# Patient Record
Sex: Male | Born: 1975 | Hispanic: Yes | Marital: Married | Smoking: Never smoker
Health system: Southern US, Community
[De-identification: ages and names within clinical notes are randomized; demographics above are authoritative.]

## PROBLEM LIST (undated history)

## (undated) DIAGNOSIS — N186 End stage renal disease: Secondary | ICD-10-CM

## (undated) DIAGNOSIS — N200 Calculus of kidney: Secondary | ICD-10-CM

## (undated) DIAGNOSIS — M899 Disorder of bone, unspecified: Secondary | ICD-10-CM

## (undated) DIAGNOSIS — C9000 Multiple myeloma not having achieved remission: Secondary | ICD-10-CM

## (undated) DIAGNOSIS — R519 Headache, unspecified: Secondary | ICD-10-CM

## (undated) DIAGNOSIS — G709 Myoneural disorder, unspecified: Secondary | ICD-10-CM

## (undated) DIAGNOSIS — I10 Essential (primary) hypertension: Secondary | ICD-10-CM

## (undated) DIAGNOSIS — Z992 Dependence on renal dialysis: Secondary | ICD-10-CM

## (undated) DIAGNOSIS — M898X9 Other specified disorders of bone, unspecified site: Secondary | ICD-10-CM

## (undated) DIAGNOSIS — R51 Headache: Secondary | ICD-10-CM

## (undated) DIAGNOSIS — K219 Gastro-esophageal reflux disease without esophagitis: Secondary | ICD-10-CM

## (undated) DIAGNOSIS — Z9289 Personal history of other medical treatment: Secondary | ICD-10-CM

## (undated) HISTORY — DX: Multiple myeloma not having achieved remission: C90.00

## (undated) HISTORY — DX: Other specified disorders of bone, unspecified site: M89.8X9

## (undated) HISTORY — PX: INSERTION OF DIALYSIS CATHETER: SHX1324

## (undated) HISTORY — DX: Disorder of bone, unspecified: M89.9

---

## 2010-10-11 ENCOUNTER — Emergency Department (HOSPITAL_COMMUNITY)
Admission: EM | Admit: 2010-10-11 | Discharge: 2010-10-11 | Disposition: A | Discharge status: Home / Self Care | Payer: Worker's Compensation | Attending: Emergency Medicine | Admitting: Emergency Medicine

## 2010-10-11 ENCOUNTER — Emergency Department (HOSPITAL_COMMUNITY): Payer: Worker's Compensation

## 2010-10-11 DIAGNOSIS — W11XXXA Fall on and from ladder, initial encounter: Secondary | ICD-10-CM | POA: Insufficient documentation

## 2010-10-11 DIAGNOSIS — S0120XA Unspecified open wound of nose, initial encounter: Secondary | ICD-10-CM | POA: Insufficient documentation

## 2010-10-11 DIAGNOSIS — S060X1A Concussion with loss of consciousness of 30 minutes or less, initial encounter: Secondary | ICD-10-CM | POA: Insufficient documentation

## 2010-10-11 DIAGNOSIS — S8990XA Unspecified injury of unspecified lower leg, initial encounter: Secondary | ICD-10-CM | POA: Insufficient documentation

## 2010-10-11 DIAGNOSIS — S0180XA Unspecified open wound of other part of head, initial encounter: Secondary | ICD-10-CM | POA: Insufficient documentation

## 2010-10-11 DIAGNOSIS — S7010XA Contusion of unspecified thigh, initial encounter: Secondary | ICD-10-CM | POA: Insufficient documentation

## 2010-10-11 DIAGNOSIS — Y990 Civilian activity done for income or pay: Secondary | ICD-10-CM | POA: Insufficient documentation

## 2010-10-11 DIAGNOSIS — R51 Headache: Secondary | ICD-10-CM | POA: Insufficient documentation

## 2010-10-11 DIAGNOSIS — S0003XA Contusion of scalp, initial encounter: Secondary | ICD-10-CM | POA: Insufficient documentation

## 2010-10-11 DIAGNOSIS — S0083XA Contusion of other part of head, initial encounter: Secondary | ICD-10-CM | POA: Insufficient documentation

## 2010-10-15 ENCOUNTER — Inpatient Hospital Stay (HOSPITAL_COMMUNITY)
Admission: RE | Admit: 2010-10-15 | Discharge: 2010-10-15 | Disposition: A | Discharge status: Home / Self Care | Payer: Worker's Compensation | Source: Ambulatory Visit | Attending: Family Medicine | Admitting: Family Medicine

## 2014-01-05 ENCOUNTER — Ambulatory Visit (INDEPENDENT_AMBULATORY_CARE_PROVIDER_SITE_OTHER): Payer: Self-pay | Admitting: Family Medicine

## 2014-01-05 ENCOUNTER — Ambulatory Visit (INDEPENDENT_AMBULATORY_CARE_PROVIDER_SITE_OTHER): Payer: Self-pay

## 2014-01-05 VITALS — BP 130/82 | HR 68 | Temp 98.3°F | Resp 16 | Ht 62.0 in | Wt 162.1 lb

## 2014-01-05 DIAGNOSIS — R059 Cough, unspecified: Secondary | ICD-10-CM

## 2014-01-05 DIAGNOSIS — R05 Cough: Secondary | ICD-10-CM

## 2014-01-05 DIAGNOSIS — R0789 Other chest pain: Secondary | ICD-10-CM

## 2014-01-05 MED ORDER — MELOXICAM 7.5 MG PO TABS: 7.5000 mg | ORAL_TABLET | Freq: Every day | ORAL | Status: DC

## 2014-01-05 NOTE — Patient Instructions (Addendum)
Creo que su dolor es de musculos.  empiece mobic una vez cada dia si necesario.  regrese aqui o al cuarto de emergnecia si su simptomas no estan mejoran en proximo 10 dias, o mas temprano si te empeorse.    Dolor de la pared torcica (Chest Wall Pain) Dolor en la pared torcica es dolor en o alrededor de los huesos y msculos de su pecho. Podrn pasar hasta 6 semanas hasta que comience a mejorar. Puede demorar ms tiempo si es fsicamente activo en su [place].  CAUSAS  El dolor en el pecho puede aparecer sin motivo. No obstante, algunas causas pueden ser:   Ardelia Mems enfermedad viral como la gripe.  Traumatismos.  Tos.  La prctica de ejercicios.  Artritis.  Fibromialgia  Culebrilla. INSTRUCCIONES PARA EL CUIDADO DOMICILIARIO  Evite hacer actividad fsica extenuante. Trate de no esforzarse o Librarian, academic. Aqu se incluyen las actividades en las que [place] los msculos del trax, los abdominales y los msculos laterales, especialmente si debe levantar objetos pesados.  Aplique hielo sobre la zona dolorida.  Ponga el hielo en una bolsa plstica.  Colquese una toalla entre la piel y la bolsa de hielo.  Deje la bolsa de hielo durante 15 a 20 minutos por hora, durante los primeros 2 das.  Utilice los medicamentos de venta libre o de prescripcin para Conservation officer, historic buildings, Health and safety inspector o la [place], segn se lo indique el profesional que lo asiste. SOLICITE ATENCIN MDICA DE INMEDIATO SI:  El dolor aumenta o siente muchas molestias.  Tiene fiebre.  El dolor de [place].  Desarrolla nuevos e inexplicables sntomas.  Tiene nuseas o vmitos.  Philbert Riser o se siente mareado.  Tiene tos con flema (esputo), o tose con sangre. EST SEGURO QUE:   Comprende las instrucciones para el alta mdica.  Controlar su enfermedad.  Solicitar atencin mdica de inmediato segn las indicaciones. Document Released: 02/28/2006 Document Revised:  04/11/2011 [facility] Patient Information 2015 [place]. This information is not intended to replace advice given to you by your health care provider. Make sure you discuss any questions you have with your health care provider.

## 2014-01-05 NOTE — Progress Notes (Addendum)
Subjective:    Patient ID: Charles [name], male    DOB: [birth year], 38 y.o.   MRN: [id number]  This chart was scribed for Wendie Agreste, MD by Chester Holstein, ED Scribe. This patient was seen in room 4 and the patient's care was started at 4:30 PM.   HPI  HPI Comments: Charles [name] is a 38 y.o. male who presents to the [facility] complaining of sharp upper back pain and soreness.  Pt notes pain started 2 weeks ago as left sided chest pain with muscle soreness and spasms, which has resolved and then radiated to the right side. Pt notes associated cough and flu-symptoms which have resolved. Pt states pain does not feel like pressure. Pt has taken Advil once or twice a day for relief. Pt notes pain worsens with deep breaths. Pt has PMHx of slightly elevated cholesterol, no hypertension, or cardiopulmonary problems. 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  Pt denies fever, nausea, vomiting, SOB, sweating, calf pain or swelling, prolonged travel, and contact with other sick indivduals. Pt has no PCP.   There are no active problems to display for this patient.  History reviewed. No pertinent past medical history. History reviewed. No pertinent past surgical history. No Known Allergies Prior to Admission medications   Not on File   History   Social History  . Marital Status: Married    Spouse Name: N/A    Number of Children: N/A  . Years of Education: N/A   Occupational History  . Not on file.   Social History Main Topics  . Smoking status: Never Smoker   . Smokeless tobacco: Never Used  . Alcohol Use: No  . Drug Use: No  . Sexual Activity: Not on file   Other Topics Concern  . Not on file   Social History Narrative  . No narrative on file        Review of Systems  Constitutional: Negative for fever and diaphoresis.  Respiratory: Positive for cough (at onset). Negative for shortness of breath.   Cardiovascular: Positive for chest  pain. Negative for leg swelling.  Gastrointestinal: Negative for nausea and vomiting.  Musculoskeletal: Positive for back pain (upper ).       Objective:   Physical Exam  Constitutional: He is oriented to person, place, and time. He appears well-developed and well-nourished.  HENT:  Head: Normocephalic and atraumatic.  Right Ear: Tympanic membrane, external ear and ear canal normal.  Left Ear: Tympanic membrane, external ear and ear canal normal.  Nose: No rhinorrhea.  Mouth/Throat: Oropharynx is clear and moist and mucous membranes are normal. No oropharyngeal exudate or posterior oropharyngeal erythema.  Eyes: Conjunctivae are normal. Pupils are equal, round, and reactive to light.  Neck: Neck supple.  Cardiovascular: Normal rate, regular rhythm, normal heart sounds and intact distal pulses.   No murmur heard. Pulmonary/Chest: Effort normal and breath sounds normal. No accessory muscle usage. No respiratory distress. He has no wheezes. He has no rhonchi. He has no rales.   He exhibits tenderness. He exhibits no mass and no bony tenderness.    Reproduced areas of pain with tenderness with palpation over R rhomboids and upper R anterior chest wall/pectoralis mm.  No defect palpated, no crepitance.   Abdominal: Soft. There is no tenderness.  Lymphadenopathy:    He has no cervical adenopathy.  Neurological: He is alert and oriented to person, place, and time.  Skin: Skin is warm and dry. No rash noted.  Psychiatric: He has a normal mood and affect. His behavior is normal.  Nursing note and vitals reviewed.   Filed Vitals:   01/05/14 1559  BP: 130/82  Pulse: 68  Temp: 98.3 F (36.8 C)  TempSrc: Oral  Resp: 16  Height: 5\' 2"  (1.575 m)  Weight: 162 lb 2 oz (73.539 kg)  SpO2: 96%     [facility] reading (PRIMARY) by  Dr. Carlota Raspberry: CXR: no acute findings, no fractures identified.      Assessment & Plan:   Charles [name] is a 38 y.o. male Chest wall pain - Plan: DG Chest 2  View, meloxicam (MOBIC) 7.5 MG tablet  Cough  Suspected chest wall pain from prior cough and viral illness by history.  Cough has improved. No concerning associated symptoms with chest wall pain and reproducible with palpation here, as well as the left upper pain was reproducible by his history when left sided pain was present.   -trial of Mobic QD. Relative rest, sx care with heat or ice if needed, handout below with RTC and ER precautions. Spanish spoken and translator present with understanding expressed.   Meds ordered this encounter  Medications  . meloxicam (MOBIC) 7.5 MG tablet    Sig: Take 1 tablet (7.5 mg total) by mouth daily.    Dispense:  30 tablet    Refill:  0   Patient Instructions  Creo que su dolor es de musculos.  empiece mobic una vez cada dia si necesario.  regrese aqui o al cuarto de emergnecia si su simptomas no estan mejoran en proximo 10 dias, o mas temprano si te empeorse.    Dolor de la pared torcica (Chest Wall Pain) Dolor en la pared torcica es dolor en o alrededor de los huesos y msculos de su pecho. Podrn pasar hasta 6 semanas hasta que comience a mejorar. Puede demorar ms tiempo si es fsicamente activo en su [place].  CAUSAS  El dolor en el pecho puede aparecer sin motivo. No obstante, algunas causas pueden ser:   Ardelia Mems enfermedad viral como la gripe.  Traumatismos.  Tos.  La prctica de ejercicios.  Artritis.  Fibromialgia  Culebr[place]. INSTRUCCIONES PARA EL CUIDADO DOMICILIARIO  Evite hacer actividad fsica extenuante. Trate de no esforzarse o Librarian, academic. Aqu se incluyen las actividades en las que [place] los msculos del trax, los abdominales y los msculos laterales, especialmente si debe levantar objetos pesados.  Aplique hielo sobre la zona dolorida.  Ponga el hielo en una bolsa plstica.  Colquese una toalla entre la piel y la bolsa de hielo.  Deje la bolsa de hielo durante 15 a 20  minutos por hora, durante los primeros 2 das.  Utilice los medicamentos de venta libre o de prescripcin para Conservation officer, historic buildings, Health and safety inspector o la [place], segn se lo indique el profesional que lo asiste. SOLICITE ATENCIN MDICA DE INMEDIATO SI:  El dolor aumenta o siente muchas molestias.  Tiene fiebre.  El dolor de [place].  Desarrolla nuevos e inexplicables sntomas.  Tiene nuseas o vmitos.  Philbert Riser o se siente mareado.  Tiene tos con flema (esputo), o tose con sangre. EST SEGURO QUE:   Comprende las instrucciones para el alta mdica.  Controlar su enfermedad.  Solicitar atencin mdica de inmediato segn las indicaciones. Document Released: 02/28/2006 Document Revised: 04/11/2011 [facility] Patient Information 2015 [place]. This information is not intended to replace advice given to you by your health care provider. Make sure you discuss any questions  you have with your health care provider.        I personally performed the services described in this documentation, which was scribed in my presence. The recorded information has been reviewed and considered, and addended by me as needed.

## 2014-01-30 ENCOUNTER — Emergency Department (HOSPITAL_COMMUNITY): Payer: Medicaid Other

## 2014-01-30 ENCOUNTER — Encounter (HOSPITAL_COMMUNITY): Payer: Self-pay | Admitting: Family Medicine

## 2014-01-30 ENCOUNTER — Inpatient Hospital Stay (HOSPITAL_COMMUNITY)
Admission: EM | Admit: 2014-01-30 | Discharge: 2014-02-04 | DRG: 825 | Disposition: A | Discharge status: Home / Self Care | Payer: Medicaid Other | Attending: Internal Medicine | Admitting: Internal Medicine

## 2014-01-30 DIAGNOSIS — N183 Chronic kidney disease, stage 3 unspecified: Secondary | ICD-10-CM

## 2014-01-30 DIAGNOSIS — R634 Abnormal weight loss: Secondary | ICD-10-CM | POA: Diagnosis present

## 2014-01-30 DIAGNOSIS — M898X8 Other specified disorders of bone, other site: Secondary | ICD-10-CM

## 2014-01-30 DIAGNOSIS — C9000 Multiple myeloma not having achieved remission: Secondary | ICD-10-CM | POA: Diagnosis present

## 2014-01-30 DIAGNOSIS — M8958 Osteolysis, other site: Secondary | ICD-10-CM | POA: Diagnosis present

## 2014-01-30 DIAGNOSIS — N19 Unspecified kidney failure: Secondary | ICD-10-CM

## 2014-01-30 DIAGNOSIS — R079 Chest pain, unspecified: Secondary | ICD-10-CM

## 2014-01-30 DIAGNOSIS — R05 Cough: Secondary | ICD-10-CM | POA: Diagnosis present

## 2014-01-30 DIAGNOSIS — M8950 Osteolysis, unspecified site: Secondary | ICD-10-CM

## 2014-01-30 DIAGNOSIS — M899 Disorder of bone, unspecified: Secondary | ICD-10-CM | POA: Diagnosis present

## 2014-01-30 DIAGNOSIS — R059 Cough, unspecified: Secondary | ICD-10-CM

## 2014-01-30 LAB — CBC
HEMATOCRIT: 37.9 % — AB (ref 39.0–52.0)
HEMOGLOBIN: 12.5 g/dL — AB (ref 13.0–17.0)
MCH: 31.3 pg (ref 26.0–34.0)
MCHC: 33 g/dL (ref 30.0–36.0)
MCV: 94.8 fL (ref 78.0–100.0)
Platelets: 371 10*3/uL (ref 150–400)
RBC: 4 MIL/uL — AB (ref 4.22–5.81)
RDW: 13 % (ref 11.5–15.5)
WBC: 12.4 10*3/uL — AB (ref 4.0–10.5)

## 2014-01-30 LAB — D-DIMER, QUANTITATIVE: D-Dimer, Quant: 0.67 ug/mL-FEU — ABNORMAL HIGH (ref 0.00–0.48)

## 2014-01-30 LAB — I-STAT TROPONIN, ED: Troponin i, poc: 0 ng/mL (ref 0.00–0.08)

## 2014-01-30 LAB — BASIC METABOLIC PANEL
Anion gap: 6 (ref 5–15)
BUN: 19 mg/dL (ref 6–23)
CALCIUM: 9.4 mg/dL (ref 8.4–10.5)
CO2: 28 mmol/L (ref 19–32)
CREATININE: 1.64 mg/dL — AB (ref 0.50–1.35)
Chloride: 100 mEq/L (ref 96–112)
GFR calc Af Amer: 59 mL/min — ABNORMAL LOW (ref >90)
GFR, EST NON AFRICAN AMERICAN: 51 mL/min — AB (ref >90)
GLUCOSE: 97 mg/dL (ref 70–99)
Potassium: 3.8 mmol/L (ref 3.5–5.1)
SODIUM: 134 mmol/L — AB (ref 135–145)

## 2014-01-30 MED ORDER — OXYCODONE-ACETAMINOPHEN 5-325 MG PO TABS
2.0000 | ORAL_TABLET | Freq: Once | ORAL | Status: AC
  Administered 2014-01-30: 2 via ORAL
  Filled 2014-01-30: qty 2

## 2014-01-30 MED ORDER — IOHEXOL 350 MG/ML SOLN
80.0000 mL | Freq: Once | INTRAVENOUS | Status: AC | PRN
  Administered 2014-01-30: 80 mL via INTRAVENOUS

## 2014-01-30 MED ORDER — KETOROLAC TROMETHAMINE 30 MG/ML IJ SOLN: 30.0000 mg | Freq: Once | INTRAMUSCULAR | Status: DC

## 2014-01-30 MED ORDER — MORPHINE SULFATE 4 MG/ML IJ SOLN
4.0000 mg | Freq: Once | INTRAMUSCULAR | Status: AC
  Administered 2014-01-30: 4 mg via INTRAVENOUS
  Filled 2014-01-30: qty 1

## 2014-01-30 NOTE — ED Notes (Signed)
Patient returned from CT

## 2014-01-30 NOTE — H&P (Signed)
[facility]                                                                                    Patient Demographics  Charles [name], is a 38 y.o. male  CSN: [id number]  MRN: [id number]  DOB - 1975/03/02  Admit Date - 01/30/2014  Outpatient Primary MD for the patient is No PCP Per Patient   With History of -  History reviewed. No pertinent past medical history.    History reviewed. No pertinent past surgical history.  in for   Chief Complaint  Patient presents with  . Chest Pain     HPI  Charles [name]  is a 38 y.o. male, with no significant past medical history presenting with 7 weeks history of chest pain described as pleuritic nonradiating and not associated with shortness of breath, nausea or vomiting. Patient denies any palpitations, loss of consciousness or headache. Patient complains of weight loss for around 10 pounds in the last 1 month . No cough or fever however reports some chills. Chest CT showed lytic lesion suggestive of a malignancy and  surgery was consulted and suggested a biopsy by IR.    Review of Systems    In addition to the HPI above,  No Fever-chills, No Headache, No changes with Vision or hearing, No problems swallowing food or Liquids, No Abdominal pain, No Nausea or Vommitting, Bowel movements are regular, No Blood in stool or Urine, No dysuria, No new skin rashes or bruises, No new joints pains-aches,  No new weakness, tingling, numbness in any extremity, No recent weight gain or loss, No polyuria, polydypsia or polyphagia, No significant Mental Stressors.  A full 10 point Review of Systems was done, except as stated above, all other Review of Systems were negative.   Social History History  Substance Use Topics  . Smoking status: Never Smoker   . Smokeless tobacco: Never Used  . Alcohol Use: No     Family History History reviewed. No pertinent family history.   Prior to Admission medications    Medication Sig Start Date End Date Taking? Authorizing Provider  meloxicam (MOBIC) 7.5 MG tablet Take 1 tablet (7.5 mg total) by mouth daily. 01/05/14  Yes Wendie Agreste, MD  Menthol, Topical Analgesic, (ICY HOT EX) Apply 1 application topically daily as needed (pain).   Yes Historical Provider, MD    No Known Allergies  Physical Exam  Vitals  Blood pressure 139/77, pulse 100, temperature 99.6 F (37.6 C), temperature source Oral, resp. rate 24, SpO2 97 %.   1. General a well-developed male extremely pleasant but looks chronically ill  2. Normal affect and insight, Not Suicidal or Homicidal, Awake Alert, Oriented X 3.  3. No F.N deficits grossly, ALL C.Nerves Intact  4. Ears and Eyes appear Normal, Conjunctivae clear, PERRLA. Moist Oral Mucosa.  5. Supple Neck, No JVD, No cervical lymphadenopathy appriciated, No Carotid Bruits.  6. Symmetrical Chest wall movement, Good air movement bilaterally, CTAB.  7. RRR, No Gallops, Rubs or Murmurs, No Parasternal Heave.  8. Positive Bowel Sounds, Abdomen Soft, Non tender, No organomegaly appriciated,No rebound -guarding or rigidity.  9.  No Cyanosis, Normal  Skin Turgor, No Skin Rash or Bruise.  10. Good muscle tone,  joints appear normal , no effusions, Normal ROM.  11. No Palpable Lymph Nodes in Neck or Axillae    Data Review  CBC  Recent Labs Lab 01/30/14 1401  WBC 12.4*  HGB 12.5*  HCT 37.9*  PLT 371  MCV 94.8  MCH 31.3  MCHC 33.0  RDW 13.0   ------------------------------------------------------------------------------------------------------------------  Chemistries   Recent Labs Lab 01/30/14 1401  NA 134*  K 3.8  CL 100  CO2 28  GLUCOSE 97  BUN 19  CREATININE 1.64*  CALCIUM 9.4   ------------------------------------------------------------------------------------------------------------------ CrCl cannot be calculated (Unknown ideal  weight.). ------------------------------------------------------------------------------------------------------------------ No results for input(s): TSH, T4TOTAL, T3FREE, THYROIDAB in the last 72 hours.  Invalid input(s): FREET3   Coagulation profile No results for input(s): INR, PROTIME in the last 168 hours. -------------------------------------------------------------------------------------------------------------------  Recent Labs  01/30/14 1458  DDIMER 0.67*   -------------------------------------------------------------------------------------------------------------------  Cardiac Enzymes No results for input(s): CKMB, TROPONINI, MYOGLOBIN in the last 168 hours.  Invalid input(s): CK ------------------------------------------------------------------------------------------------------------------ Invalid input(s): POCBNP   ---------------------------------------------------------------------------------------------------------------  Urinalysis No results found for: COLORURINE, APPEARANCEUR, LABSPEC, PHURINE, GLUCOSEU, HGBUR, BILIRUBINUR, KETONESUR, PROTEINUR, UROBILINOGEN, NITRITE, LEUKOCYTESUR  ----------------------------------------------------------------------------------------------------------------   Imaging results:   Dg Chest 2 View  01/30/2014   CLINICAL DATA:  Chest pain for several weeks.  Shortness of breath.  EXAM: CHEST  2 VIEW  COMPARISON:  01/05/2014  FINDINGS: The cardiac silhouette, mediastinal and hilar contours are within normal limits and stable. Low lung volumes with vascular crowding and atelectasis. Vague densities in both lungs are most likely areas of atelectasis but. No infiltrate or effusion.  IMPRESSION: Low lung volumes with vascular crowding and streaky areas of atelectasis.   Electronically Signed   By: Kalman Jewels M.D.   On: 01/30/2014 14:50   Dg Chest 2 View  01/05/2014   CLINICAL DATA:  Initial evaluation chest wall pain   EXAM: CHEST  2 VIEW  COMPARISON:  None.  FINDINGS: The heart size and mediastinal contours are within normal limits. Both lungs are clear. The visualized skeletal structures are unremarkable.  IMPRESSION: No active cardiopulmonary disease.   Electronically Signed   By: Skipper Cliche M.D.   On: 01/05/2014 17:44   Ct Angio Chest Pe W/cm &/or Wo Cm  01/30/2014   CLINICAL DATA:  Right-sided chest pain for several weeks. Coughing. Pulmonary embolism.  EXAM: CT ANGIOGRAPHY CHEST WITH CONTRAST  TECHNIQUE: Multidetector CT imaging of the chest was performed using the standard protocol during bolus administration of intravenous contrast. Multiplanar CT image reconstructions and MIPs were obtained to evaluate the vascular anatomy.  CONTRAST:  12mL OMNIPAQUE IOHEXOL 350 MG/ML SOLN  COMPARISON:  01/30/2014 chest radiograph.  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 There is erosion of both the anterior and posterior cortex and cortical per made of osteolysis is present. There is retrosternal extension of the manubrial mass, with soft tissue density in the anterior mediastinum measuring 37 mm transverse by 14 mm AP. Despite the anterior sternal manubrium osteolysis, there does not appear to be anterior extraosseous extension of tumor.  The sternal body appears within normal limits. No destructive rib lesions are visible. Thoracic spine appears within normal limits. No supraclavicular adenopathy is visible.  Cardiovascular: Negative for pulmonary embolism. Technically adequate study. The heart appears normal.  Lungs: Atelectasis.  No evidence of pulmonary metastatic disease.  Central airways: Patent.  Effusions: None.  Lymphadenopathy: No axillary adenopathy. No mediastinal or hilar adenopathy.  Esophagus: Normal.  Upper abdomen: Normal.  Other: Thyroid gland shows tiny low-density lesions that probably represents cysts.  Review of the MIP images confirms the above findings.  IMPRESSION: 1.  Negative for pulmonary embolus or acute aortic abnormality. 2. Osteolytic lesion destroying the sternal manubrium with extraosseous extension of tumor into the anterior mediastinum. 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 In this location, the most common lesion is a chondrosarcoma however this does not have typical features of a chondrosarcoma and either metastatic disease, lymphoma or myeloma are more likely.   Electronically Signed   By: Dereck Ligas M.D.   On: 01/30/2014 18:43    Assessment & Plan   1. 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 Renal insufficiency  Plan  Admit the patient to [facility] for biopsy Surgery is following Consult oncology in a.m. IV fluids   DVT Prophylaxis Heparin   AM Labs Ordered, also please review Full Orders  Family Communication: Admission, patients condition and plan of care including tests being ordered have been discussed with the patient and son who indicate understanding and agree with the plan and Code Status.  Code Status full  Disposition Plan: Home  Time spent in minutes : 32 minutes  Condition GUARDED   @SIGNATURE @

## 2014-01-30 NOTE — ED Provider Notes (Signed)
CSN: [id number]     Arrival date & time 01/30/14  1351 History   First MD Initiated Contact with Patient 01/30/14 1556     Chief Complaint  Patient presents with  . Chest Pain     (Consider location/radiation/quality/duration/timing/severity/associated sxs/prior Treatment) HPI Comments: Patient with no pertinent past medical history presents emergency department with the chief complaints of left-sided chest pain. Patient states that he has had the pain for the past 7 weeks. He states that he has been seen by 2 different urgent cares, and was prescribed Mobic for chest wall pain.  He denies any improvement with this treatment. She states that he did have a cough, but does not anymore. He denies any fevers or chills. Denies any nausea or vomiting. Dates that the pain is worsened with deep breathing. Is any history of PE or DVT. Denies any recent surgical history or recent immobilization. Denies any cardiac risks factors.  The history is provided by the patient. No language interpreter was used.    History reviewed. No pertinent past medical history. History reviewed. No pertinent past surgical history. History reviewed. No pertinent family history. History  Substance Use Topics  . Smoking status: Never Smoker   . Smokeless tobacco: Never Used  . Alcohol Use: No    Review of Systems  Constitutional: Negative for fever and chills.  Respiratory: Negative for shortness of breath.   Cardiovascular: Positive for chest pain.  Gastrointestinal: Negative for nausea, vomiting, diarrhea and constipation.  Genitourinary: Negative for dysuria.  All other systems reviewed and are negative.     Allergies  Review of patient's allergies indicates no known allergies.  Home Medications   Prior to Admission medications   Medication Sig Start Date End Date Taking? Authorizing Provider  meloxicam (MOBIC) 7.5 MG tablet Take 1 tablet (7.5 mg total) by mouth daily. 01/05/14   Wendie Agreste, MD    There were no vitals taken for this visit. Physical Exam  Constitutional: He is oriented to person, place, and time. He appears well-developed and well-nourished.  HENT:  Head: Normocephalic and atraumatic.  Eyes: Conjunctivae and EOM are normal. Pupils are equal, round, and reactive to light. Right eye exhibits no discharge. Left eye exhibits no discharge. No scleral icterus.  Neck: Normal range of motion. Neck supple. No JVD present.  Cardiovascular: Normal rate, regular rhythm and normal heart sounds.  Exam reveals no gallop and no friction rub.   No murmur heard. Pulmonary/Chest: Effort normal and breath sounds normal. No respiratory distress. He has no wheezes. He has no rales. He exhibits no tenderness.  Anterior chest wall tender to palpation  Abdominal: Soft. He exhibits no distension and no mass. There is no tenderness. There is no rebound and no guarding.  Musculoskeletal: Normal range of motion. He exhibits no edema or tenderness.  Chest wall pain with chest flexion  Neurological: He is alert and oriented to person, place, and time.  Skin: Skin is warm and dry.  Psychiatric: He has a normal mood and affect. His behavior is normal. Judgment and thought content normal.  Nursing note and vitals reviewed.   ED Course  Procedures (including critical care time) Results for orders placed or performed during the hospital encounter of 01/30/14  CBC  Result Value Ref Range   WBC 12.4 (H) 4.0 - 10.5 K/uL   RBC 4.00 (L) 4.22 - 5.81 MIL/uL   Hemoglobin 12.5 (L) 13.0 - 17.0 g/dL   HCT 37.9 (L) 39.0 - 52.0 %  MCV 94.8 78.0 - 100.0 fL   MCH 31.3 26.0 - 34.0 pg   MCHC 33.0 30.0 - 36.0 g/dL   RDW 13.0 11.5 - 15.5 %   Platelets 371 150 - 400 K/uL  Basic metabolic panel  Result Value Ref Range   Sodium 134 (L) 135 - 145 mmol/L   Potassium 3.8 3.5 - 5.1 mmol/L   Chloride 100 96 - 112 mEq/L   CO2 28 19 - 32 mmol/L   Glucose, Bld 97 70 - 99 mg/dL   BUN 19 6 - 23 mg/dL   Creatinine,  Ser 1.64 (H) 0.50 - 1.35 mg/dL   Calcium 9.4 8.4 - 10.5 mg/dL   GFR calc non Af Amer 51 (L) >90 mL/min   GFR calc Af Amer 59 (L) >90 mL/min   Anion gap 6 5 - 15  D-dimer, quantitative  Result Value Ref Range   D-Dimer, Quant 0.67 (H) 0.00 - 0.48 ug/mL-FEU  I-stat troponin, ED (not at [facility])  Result Value Ref Range   Troponin i, poc 0.00 0.00 - 0.08 ng/mL   Comment 3           Dg Chest 2 View  01/30/2014   CLINICAL DATA:  Chest pain for several weeks.  Shortness of breath.  EXAM: CHEST  2 VIEW  COMPARISON:  01/05/2014  FINDINGS: The cardiac silhouette, mediastinal and hilar contours are within normal limits and stable. Low lung volumes with vascular crowding and atelectasis. Vague densities in both lungs are most likely areas of atelectasis but. No infiltrate or effusion.  IMPRESSION: Low lung volumes with vascular crowding and streaky areas of atelectasis.   Electronically Signed   By: Kalman Jewels M.D.   On: 01/30/2014 14:50   Dg Chest 2 View  01/05/2014   CLINICAL DATA:  Initial evaluation chest wall pain  EXAM: CHEST  2 VIEW  COMPARISON:  None.  FINDINGS: The heart size and mediastinal contours are within normal limits. Both lungs are clear. The visualized skeletal structures are unremarkable.  IMPRESSION: No active cardiopulmonary disease.   Electronically Signed   By: Skipper Cliche M.D.   On: 01/05/2014 17:44   Ct Angio Chest Pe W/cm &/or Wo Cm  01/30/2014   CLINICAL DATA:  Right-sided chest pain for several weeks. Coughing. Pulmonary embolism.  EXAM: CT ANGIOGRAPHY CHEST WITH CONTRAST  TECHNIQUE: Multidetector CT imaging of the chest was performed using the standard protocol during bolus administration of intravenous contrast. Multiplanar CT image reconstructions and MIPs were obtained to evaluate the vascular anatomy.  CONTRAST:  1mL OMNIPAQUE IOHEXOL 350 MG/ML SOLN  COMPARISON:  01/30/2014 chest radiograph.  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 There  is erosion of both the anterior and posterior cortex and cortical per made of osteolysis is present. There is retrosternal extension of the manubrial mass, with soft tissue density in the anterior mediastinum measuring 37 mm transverse by 14 mm AP. Despite the anterior sternal manubrium osteolysis, there does not appear to be anterior extraosseous extension of tumor.  The sternal body appears within normal limits. No destructive rib lesions are visible. Thoracic spine appears within normal limits. No supraclavicular adenopathy is visible.  Cardiovascular: Negative for pulmonary embolism. Technically adequate study. The heart appears normal.  Lungs: Atelectasis.  No evidence of pulmonary metastatic disease.  Central airways: Patent.  Effusions: None.  Lymphadenopathy: No axillary adenopathy. No mediastinal or hilar adenopathy.  Esophagus: Normal.  Upper abdomen: Normal.  Other: Thyroid gland shows tiny low-density  lesions that probably represents cysts.  Review of the MIP images confirms the above findings.  IMPRESSION: 1. Negative for pulmonary embolus or acute aortic abnormality. 2. Osteolytic lesion destroying the sternal manubrium with extraosseous extension of tumor into the anterior mediastinum. 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 In this location, the most common lesion is a chondrosarcoma however this does not have typical features of a chondrosarcoma and either metastatic disease, lymphoma or myeloma are more likely.   Electronically Signed   By: Dereck Ligas M.D.   On: 01/30/2014 18:43     Imaging Review Dg Chest 2 View  01/30/2014   CLINICAL DATA:  Chest pain for several weeks.  Shortness of breath.  EXAM: CHEST  2 VIEW  COMPARISON:  01/05/2014  FINDINGS: The cardiac silhouette, mediastinal and hilar contours are  within normal limits and stable. Low lung volumes with vascular crowding and atelectasis. Vague densities in both lungs are most likely areas of atelectasis but. No infiltrate or effusion.  IMPRESSION: Low lung volumes with vascular crowding and streaky areas of atelectasis.   Electronically Signed   By: Kalman Jewels M.D.   On: 01/30/2014 14:50     EKG Interpretation None      MDM   Final diagnoses:  Chest pain  Osteolytic lesion   Patient with pleuritic chest pain x 7 weeks.  No improvement with medications.  States that it is worsened with deep breathing.  He has been seen several times for the same at local [facility].    D-dimer is elevated, will check CT angio chest to rule out PE.  9:06 PM Patient discussed with Dr. Roxy Manns from cardiothoracic surgery, who recommends interventional radiology fine-needle biopsy. In consulting with social work, the patient, and Dr. Ayesha Rumpf, this will need to be done on an inpatient basis, so the patient can follow-up at the thoracic oncology clinic next Thursday. Dr. Roxy Manns will arrange for this to occur. Plan explained to the patient using the interpreter phone. Patient to be admitted by right hospitalists.  Appreciate Dr. Laren Everts for admitting.   Montine Circle, PA-C 01/30/14 2108  Quintella Reichert, MD 01/30/14 2125

## 2014-01-30 NOTE — ED Notes (Signed)
Pt here for right sided chest pain for a few weeks. sts some coughing. sts seen by [facility] a few weeks ago and given medication but not better.

## 2014-01-30 NOTE — ED Notes (Signed)
Attempted report x1. 

## 2014-01-31 DIAGNOSIS — N179 Acute kidney failure, unspecified: Secondary | ICD-10-CM

## 2014-01-31 LAB — BASIC METABOLIC PANEL
Anion gap: 7 (ref 5–15)
BUN: 18 mg/dL (ref 6–23)
CO2: 28 mmol/L (ref 19–32)
Calcium: 9.9 mg/dL (ref 8.4–10.5)
Chloride: 101 mEq/L (ref 96–112)
Creatinine, Ser: 1.64 mg/dL — ABNORMAL HIGH (ref 0.50–1.35)
GFR calc Af Amer: 59 mL/min — ABNORMAL LOW (ref >90)
GFR, EST NON AFRICAN AMERICAN: 51 mL/min — AB (ref >90)
GLUCOSE: 139 mg/dL — AB (ref 70–99)
Potassium: 3.9 mmol/L (ref 3.5–5.1)
Sodium: 136 mmol/L (ref 135–145)

## 2014-01-31 LAB — URINE MICROSCOPIC-ADD ON

## 2014-01-31 LAB — CBC
HCT: 34.5 % — ABNORMAL LOW (ref 39.0–52.0)
HCT: 34.7 % — ABNORMAL LOW (ref 39.0–52.0)
HEMOGLOBIN: 11.4 g/dL — AB (ref 13.0–17.0)
Hemoglobin: 11.4 g/dL — ABNORMAL LOW (ref 13.0–17.0)
MCH: 30.6 pg (ref 26.0–34.0)
MCH: 30.6 pg (ref 26.0–34.0)
MCHC: 32.9 g/dL (ref 30.0–36.0)
MCHC: 33 g/dL (ref 30.0–36.0)
MCV: 92.7 fL (ref 78.0–100.0)
MCV: 93 fL (ref 78.0–100.0)
Platelets: 336 10*3/uL (ref 150–400)
Platelets: 349 10*3/uL (ref 150–400)
RBC: 3.72 MIL/uL — AB (ref 4.22–5.81)
RBC: 3.73 MIL/uL — ABNORMAL LOW (ref 4.22–5.81)
RDW: 13.1 % (ref 11.5–15.5)
RDW: 13.1 % (ref 11.5–15.5)
WBC: 10.2 10*3/uL (ref 4.0–10.5)
WBC: 10.4 10*3/uL (ref 4.0–10.5)

## 2014-01-31 LAB — URINALYSIS, ROUTINE W REFLEX MICROSCOPIC
Bilirubin Urine: NEGATIVE
Glucose, UA: NEGATIVE mg/dL
Ketones, ur: NEGATIVE mg/dL
LEUKOCYTES UA: NEGATIVE
Nitrite: NEGATIVE
PH: 6 (ref 5.0–8.0)
Protein, ur: 100 mg/dL — AB
Specific Gravity, Urine: 1.019 (ref 1.005–1.030)
UROBILINOGEN UA: 0.2 mg/dL (ref 0.0–1.0)

## 2014-01-31 LAB — PROTIME-INR
INR: 1.18 (ref 0.00–1.49)
Prothrombin Time: 15.1 seconds (ref 11.6–15.2)

## 2014-01-31 LAB — LACTATE DEHYDROGENASE: LDH: 184 U/L (ref 94–250)

## 2014-01-31 LAB — GLUCOSE, CAPILLARY: GLUCOSE-CAPILLARY: 112 mg/dL — AB (ref 70–99)

## 2014-01-31 LAB — CREATININE, SERUM
CREATININE: 1.68 mg/dL — AB (ref 0.50–1.35)
GFR calc Af Amer: 58 mL/min — ABNORMAL LOW (ref >90)
GFR calc non Af Amer: 50 mL/min — ABNORMAL LOW (ref >90)

## 2014-01-31 MED ORDER — SODIUM CHLORIDE 0.9 % IV SOLN: 250.0000 mL | INTRAVENOUS | Status: DC | PRN

## 2014-01-31 MED ORDER — OXYCODONE HCL 5 MG PO TABS
5.0000 mg | ORAL_TABLET | ORAL | Status: DC | PRN
  Administered 2014-01-31 – 2014-02-01 (×2): 5 mg via ORAL
  Filled 2014-01-31 (×2): qty 1

## 2014-01-31 MED ORDER — HYDROMORPHONE HCL 1 MG/ML IJ SOLN
1.0000 mg | INTRAMUSCULAR | Status: DC | PRN
  Administered 2014-01-31 (×2): 1 mg via INTRAVENOUS
  Filled 2014-01-31 (×2): qty 1

## 2014-01-31 MED ORDER — ACETAMINOPHEN 325 MG PO TABS: 650.0000 mg | ORAL_TABLET | Freq: Four times a day (QID) | ORAL | Status: DC | PRN

## 2014-01-31 MED ORDER — ACETAMINOPHEN 650 MG RE SUPP: 650.0000 mg | Freq: Four times a day (QID) | RECTAL | Status: DC | PRN

## 2014-01-31 MED ORDER — ONDANSETRON HCL 4 MG PO TABS: 4.0000 mg | ORAL_TABLET | Freq: Four times a day (QID) | ORAL | Status: DC | PRN

## 2014-01-31 MED ORDER — ALBUTEROL SULFATE (2.5 MG/3ML) 0.083% IN NEBU
2.5000 mg | INHALATION_SOLUTION | Freq: Four times a day (QID) | RESPIRATORY_TRACT | Status: DC
  Administered 2014-01-31: 2.5 mg via RESPIRATORY_TRACT
  Filled 2014-01-31: qty 3

## 2014-01-31 MED ORDER — ONDANSETRON HCL 4 MG/2ML IJ SOLN: 4.0000 mg | Freq: Four times a day (QID) | INTRAMUSCULAR | Status: DC | PRN

## 2014-01-31 MED ORDER — HEPARIN SODIUM (PORCINE) 5000 UNIT/ML IJ SOLN
5000.0000 [IU] | Freq: Three times a day (TID) | INTRAMUSCULAR | Status: DC
  Administered 2014-01-31 – 2014-02-04 (×10): 5000 [IU] via SUBCUTANEOUS
  Filled 2014-01-31 (×19): qty 1

## 2014-01-31 MED ORDER — IPRATROPIUM BROMIDE 0.02 % IN SOLN
0.5000 mg | Freq: Four times a day (QID) | RESPIRATORY_TRACT | Status: DC
  Administered 2014-01-31: 0.5 mg via RESPIRATORY_TRACT
  Filled 2014-01-31: qty 2.5

## 2014-01-31 MED ORDER — SODIUM CHLORIDE 0.9 % IJ SOLN: 3.0000 mL | Freq: Two times a day (BID) | INTRAMUSCULAR | Status: DC

## 2014-01-31 MED ORDER — SODIUM CHLORIDE 0.9 % IJ SOLN: 3.0000 mL | INTRAMUSCULAR | Status: DC | PRN

## 2014-01-31 MED ORDER — IPRATROPIUM-ALBUTEROL 0.5-2.5 (3) MG/3ML IN SOLN
3.0000 mL | Freq: Four times a day (QID) | RESPIRATORY_TRACT | Status: DC
  Administered 2014-01-31: 3 mL via RESPIRATORY_TRACT
  Filled 2014-01-31: qty 3

## 2014-01-31 MED ORDER — DEXTROSE-NACL 5-0.9 % IV SOLN
INTRAVENOUS | Status: DC
  Administered 2014-01-31: 75 mL/h via INTRAVENOUS

## 2014-01-31 NOTE — Progress Notes (Signed)
PATIENT DETAILS Name: Charles [name] Age: 39 y.o. 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 Discussed with Radiology, will get a Whole body scan to look for other lesions that are easily accessible/less risk for bx. Check SPEP/UPEP/HIV/LDH.Supportive care.    Acute Renal Failure:Check UA for to assess for proteinuria, was on NSAID's for pain. Given lytic bone lesion with Renal failure-Myeloma always a possibility.Hydrate, recheck lytes in am.  Disposition: Remain inpatient  Antibiotics:  None   Anti-infectives    None     DVT Prophylaxis: Prophylactic Heparin  Code Status: Full code   Family Communication Spouse at bedside  Procedures:  None  CONSULTS:  IR  MEDICATIONS: Scheduled Meds: . heparin  5,000 Units Subcutaneous 3 times per day  . sodium chloride  3 mL Intravenous Q12H   Continuous Infusions: . dextrose 5 % and 0.9% NaCl 75 mL/hr (01/31/14 0149)   PRN Meds:.sodium chloride, acetaminophen **OR** acetaminophen, HYDROmorphone (DILAUDID) injection, ondansetron **OR** ondansetron (ZOFRAN) IV, oxyCODONE, sodium chloride    PHYSICAL EXAM: Vital signs in last 24 hours: Filed Vitals:   01/31/14 0044 01/31/14 0213 01/31/14 0507 01/31/14 0835  BP:   118/73   Pulse:   71   Temp:   98.4 F (36.9 C)   TempSrc:   Oral   Resp:   19   SpO2: 100% 100% 98% 96%    Weight change:  There were no vitals filed for this visit. There is no weight on file to calculate BMI.   Gen Exam: Awake and alert with clear speech.  Neck: Supple, No JVD.   Chest: B/L Clear.   CVS: S1 S2 Regular, no murmurs.  Abdomen: soft, BS +, non tender, non distended.  Extremities: no edema, lower extremities warm to  touch. Neurologic: Non Focal.   Skin: No Rash.   Wounds: N/A.    Intake/Output from previous day: No intake or output data in the 24 hours ending 01/31/14 0949   LAB RESULTS: CBC  Recent Labs Lab 01/30/14 1401 01/31/14 0515 01/31/14 0525  WBC 12.4* 10.2 10.4  HGB 12.5* 11.4* 11.4*  HCT 37.9* 34.7* 34.5*  PLT 371 336 349  MCV 94.8 93.0 92.7  MCH 31.3 30.6 30.6  MCHC 33.0 32.9 33.0  RDW 13.0 13.1 13.1    Chemistries   Recent Labs Lab 01/30/14 1401 01/31/14 0515 01/31/14 0525  NA 134*  --  136  K 3.8  --  3.9  CL 100  --  101  CO2 28  --  28  GLUCOSE 97  --  139*  BUN 19  --  18  CREATININE 1.64* 1.68* 1.64*  CALCIUM 9.4  --  9.9    CBG: No results for input(s): GLUCAP in the last 168 hours.  GFR CrCl cannot be calculated (Unknown ideal weight.).  Coagulation profile  Recent Labs Lab 01/31/14 0525  INR 1.18    Cardiac Enzymes No results for input(s): CKMB, TROPONINI, MYOGLOBIN in the last 168 hours.  Invalid input(s): CK  Invalid input(s): POCBNP  Recent Labs  01/30/14 1458  DDIMER 0.67*   No results for input(s): HGBA1C in the last 72 hours. No results for input(s): CHOL, HDL, LDLCALC, TRIG, CHOLHDL, LDLDIRECT in the last 72 hours. No  results for input(s): TSH, T4TOTAL, T3FREE, THYROIDAB in the last 72 hours.  Invalid input(s): FREET3 No results for input(s): VITAMINB12, FOLATE, FERRITIN, TIBC, IRON, RETICCTPCT in the last 72 hours. No results for input(s): LIPASE, AMYLASE in the last 72 hours.  Urine Studies No results for input(s): UHGB, CRYS in the last 72 hours.  Invalid input(s): UACOL, UAPR, USPG, UPH, UTP, UGL, UKET, UBIL, UNIT, UROB, ULEU, UEPI, UWBC, URBC, UBAC, CAST, UCOM, BILUA  MICROBIOLOGY: No results found for this or any previous visit (from the past 240 hour(s)).  RADIOLOGY STUDIES/RESULTS: Dg Chest 2 View  01/30/2014   CLINICAL DATA:  Chest pain for several weeks.  Shortness of breath.  EXAM: CHEST  2 VIEW   COMPARISON:  01/05/2014  FINDINGS: The cardiac silhouette, mediastinal and hilar contours are within normal limits and stable. Low lung volumes with vascular crowding and atelectasis. Vague densities in both lungs are most likely areas of atelectasis but. No infiltrate or effusion.  IMPRESSION: Low lung volumes with vascular crowding and streaky areas of atelectasis.   Electronically Signed   By: Kalman Jewels M.D.   On: 01/30/2014 14:50   Dg Chest 2 View  01/05/2014   CLINICAL DATA:  Initial evaluation chest wall pain  EXAM: CHEST  2 VIEW  COMPARISON:  None.  FINDINGS: The heart size and mediastinal contours are within normal limits. Both lungs are clear. The visualized skeletal structures are unremarkable.  IMPRESSION: No active cardiopulmonary disease.   Electronically Signed   By: Skipper Cliche M.D.   On: 01/05/2014 17:44   Ct Angio Chest Pe W/cm &/or Wo Cm  01/30/2014   CLINICAL DATA:  Right-sided chest pain for several weeks. Coughing. Pulmonary embolism.  EXAM: CT ANGIOGRAPHY CHEST WITH CONTRAST  TECHNIQUE: Multidetector CT imaging of the chest was performed using the standard protocol during bolus administration of intravenous contrast. Multiplanar CT image reconstructions and MIPs were obtained to evaluate the vascular anatomy.  CONTRAST:  55mL OMNIPAQUE IOHEXOL 350 MG/ML SOLN  COMPARISON:  01/30/2014 chest radiograph.  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 There is erosion of both the anterior and posterior cortex and cortical per made of osteolysis is present. There is retrosternal extension of the manubrial mass, with soft tissue density in the anterior mediastinum measuring 37 mm transverse by 14 mm AP. Despite the anterior sternal manubrium osteolysis, there does not appear to be anterior extraosseous extension of tumor.  The sternal body appears within normal limits. No destructive rib lesions are visible. Thoracic spine appears within normal limits. No  supraclavicular adenopathy is visible.  Cardiovascular: Negative for pulmonary embolism. Technically adequate study. The heart appears normal.  Lungs: Atelectasis.  No evidence of pulmonary metastatic disease.  Central airways: Patent.  Effusions: None.  Lymphadenopathy: No axillary adenopathy. No mediastinal or hilar adenopathy.  Esophagus: Normal.  Upper abdomen: Normal.  Other: Thyroid gland shows tiny low-density lesions that probably represents cysts.  Review of the MIP images confirms the above findings.  IMPRESSION: 1. Negative for pulmonary embolus or acute aortic abnormality. 2. Osteolytic lesion destroying the sternal manubrium with extraosseous extension of tumor into the anterior mediastinum. 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 In this location, the most common lesion is a chondrosarcoma however this does not have typical features of a chondrosarcoma and either metastatic disease, lymphoma or myeloma are  more likely.   Electronically Signed   By: Dereck Ligas M.D.   On: 01/30/2014 18:43    Oren Binet, MD  [facility] Pager:[phone]  If 7PM-7AM, please contact night-coverage [email or web address] Password [email or web address] 01/31/2014, 9:49 AM   LOS: 1 day

## 2014-02-01 LAB — BASIC METABOLIC PANEL
ANION GAP: 6 (ref 5–15)
BUN: 12 mg/dL (ref 6–23)
CHLORIDE: 102 meq/L (ref 96–112)
CO2: 27 mmol/L (ref 19–32)
Calcium: 9.8 mg/dL (ref 8.4–10.5)
Creatinine, Ser: 1.44 mg/dL — ABNORMAL HIGH (ref 0.50–1.35)
GFR, EST AFRICAN AMERICAN: 69 mL/min — AB (ref >90)
GFR, EST NON AFRICAN AMERICAN: 60 mL/min — AB (ref >90)
Glucose, Bld: 98 mg/dL (ref 70–99)
Potassium: 3.9 mmol/L (ref 3.5–5.1)
SODIUM: 135 mmol/L (ref 135–145)

## 2014-02-01 LAB — GLUCOSE, CAPILLARY: Glucose-Capillary: 99 mg/dL (ref 70–99)

## 2014-02-01 NOTE — Progress Notes (Signed)
PATIENT DETAILS Name: Charles [name] Age: 39 y.o. Sex: male Date of Birth: [birth year] Admit Date: 01/30/2014 Admitting Physician Merton Border, MD PCP:No PCP Per Patient  Subjective: Still has pain in retrosternal area and b/l chest area-but better than the past few days.  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 Discussed with Radiology, will get a Whole body scan to look for other lesions that are easily accessible/less risk for bx. Await SPEP/UPEP/HIV.LDH not significantly elevated.Supportive care.    Acute Renal Failure: Likely prerenal azotemia secondary to NSAID use. Creatinine improving with hydration. No proteinuria on UA. Given lytic bone lesion with Renal failure-Myeloma always a possibility.Hydrate, recheck lytes in am.  Disposition: Remain inpatient  Antibiotics:  None   Anti-infectives    None     DVT Prophylaxis: Prophylactic Heparin  Code Status: Full code   Family Communication Spouse at bedside  Procedures:  None  CONSULTS:  IR  MEDICATIONS: Scheduled Meds: . heparin  5,000 Units Subcutaneous 3 times per day  . sodium chloride  3 mL Intravenous Q12H   Continuous Infusions: . dextrose 5 % and 0.9% NaCl 75 mL/hr (01/31/14 0149)   PRN Meds:.sodium chloride, acetaminophen **OR** acetaminophen, HYDROmorphone (DILAUDID) injection, ondansetron **OR** ondansetron (ZOFRAN) IV, oxyCODONE, sodium chloride    PHYSICAL EXAM: Vital signs in last 24 hours: Filed Vitals:   01/31/14 0835 01/31/14 1412 01/31/14 2023 02/01/14 0548  BP:  132/73 131/74 115/79  Pulse:  92 84 84  Temp:  98.5 F (36.9 C) 98.5 F (36.9 C) 98.2 F (36.8 C)  TempSrc:  Oral Oral Oral  Resp:  18 18 18   SpO2: 96% 95% 98% 96%    Weight change:  There were no vitals filed for this visit. There is no weight on file to calculate BMI.   Gen Exam: Awake and alert with clear speech.  Neck: Supple, No  JVD.   Chest: B/L Clear.   CVS: S1 S2 Regular, no murmurs.  Abdomen: soft, BS +, non tender, non distended.  Extremities: no edema, lower extremities warm to touch. Neurologic: Non Focal.   Skin: No Rash.   Wounds: N/A.    Intake/Output from previous day:  Intake/Output Summary (Last 24 hours) at 02/01/14 1118 Last data filed at 02/01/14 0548  Gross per 24 hour  Intake    600 ml  Output    250 ml  Net    350 ml     LAB RESULTS: CBC  Recent Labs Lab 01/30/14 1401 01/31/14 0515 01/31/14 0525  WBC 12.4* 10.2 10.4  HGB 12.5* 11.4* 11.4*  HCT 37.9* 34.7* 34.5*  PLT 371 336 349  MCV 94.8 93.0 92.7  MCH 31.3 30.6 30.6  MCHC 33.0 32.9 33.0  RDW 13.0 13.1 13.1    Chemistries   Recent Labs Lab 01/30/14 1401 01/31/14 0515 01/31/14 0525 02/01/14 0752  NA 134*  --  136 135  K 3.8  --  3.9 3.9  CL 100  --  101 102  CO2 28  --  28 27  GLUCOSE 97  --  139* 98  BUN 19  --  18 12  CREATININE 1.64* 1.68* 1.64* 1.44*  CALCIUM 9.4  --  9.9 9.8    CBG:  Recent Labs Lab 01/31/14 2148 02/01/14 0620  GLUCAP 112* 99    GFR CrCl cannot be calculated (Unknown ideal weight.).  Coagulation profile  Recent Labs Lab 01/31/14 0525  INR 1.18    Cardiac Enzymes No results for input(s): CKMB, TROPONINI, MYOGLOBIN in the last 168 hours.  Invalid input(s): CK  Invalid input(s): POCBNP  Recent Labs  01/30/14 1458  DDIMER 0.67*   No results for input(s): HGBA1C in the last 72 hours. No results for input(s): CHOL, HDL, LDLCALC, TRIG, CHOLHDL, LDLDIRECT in the last 72 hours. No results for input(s): TSH, T4TOTAL, T3FREE, THYROIDAB in the last 72 hours.  Invalid input(s): FREET3 No results for input(s): VITAMINB12, FOLATE, FERRITIN, TIBC, IRON, RETICCTPCT in the last 72 hours. No results for input(s): LIPASE, AMYLASE in the last 72 hours.  Urine Studies No results for input(s): UHGB, CRYS in the last 72 hours.  Invalid input(s): UACOL, UAPR, USPG, UPH, UTP,  UGL, UKET, UBIL, UNIT, UROB, ULEU, UEPI, UWBC, URBC, UBAC, CAST, UCOM, BILUA  MICROBIOLOGY: No results found for this or any previous visit (from the past 240 hour(s)).  RADIOLOGY STUDIES/RESULTS: Dg Chest 2 View  01/30/2014   CLINICAL DATA:  Chest pain for several weeks.  Shortness of breath.  EXAM: CHEST  2 VIEW  COMPARISON:  01/05/2014  FINDINGS: The cardiac silhouette, mediastinal and hilar contours are within normal limits and stable. Low lung volumes with vascular crowding and atelectasis. Vague densities in both lungs are most likely areas of atelectasis but. No infiltrate or effusion.  IMPRESSION: Low lung volumes with vascular crowding and streaky areas of atelectasis.   Electronically Signed   By: Kalman Jewels M.D.   On: 01/30/2014 14:50   Dg Chest 2 View  01/05/2014   CLINICAL DATA:  Initial evaluation chest wall pain  EXAM: CHEST  2 VIEW  COMPARISON:  None.  FINDINGS: The heart size and mediastinal contours are within normal limits. Both lungs are clear. The visualized skeletal structures are unremarkable.  IMPRESSION: No active cardiopulmonary disease.   Electronically Signed   By: Skipper Cliche M.D.   On: 01/05/2014 17:44   Ct Angio Chest Pe W/cm &/or Wo Cm  01/30/2014   CLINICAL DATA:  Right-sided chest pain for several weeks. Coughing. Pulmonary embolism.  EXAM: CT ANGIOGRAPHY CHEST WITH CONTRAST  TECHNIQUE: Multidetector CT imaging of the chest was performed using the standard protocol during bolus administration of intravenous contrast. Multiplanar CT image reconstructions and MIPs were obtained to evaluate the vascular anatomy.  CONTRAST:  25mL OMNIPAQUE IOHEXOL 350 MG/ML SOLN  COMPARISON:  01/30/2014 chest radiograph.  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 There is erosion of both the anterior and posterior cortex and cortical per made of osteolysis is present. There is retrosternal extension of the manubrial mass, with soft tissue density in  the anterior mediastinum measuring 37 mm transverse by 14 mm AP. Despite the anterior sternal manubrium osteolysis, there does not appear to be anterior extraosseous extension of tumor.  The sternal body appears within normal limits. No destructive rib lesions are visible. Thoracic spine appears within normal limits. No supraclavicular adenopathy is visible.  Cardiovascular: Negative for pulmonary embolism. Technically adequate study. The heart appears normal.  Lungs: Atelectasis.  No evidence of pulmonary metastatic disease.  Central airways: Patent.  Effusions: None.  Lymphadenopathy: No axillary adenopathy. No mediastinal or hilar adenopathy.  Esophagus: Normal.  Upper abdomen: Normal.  Other: Thyroid gland shows tiny low-density lesions that probably represents cysts.  Review of the MIP images confirms the above findings.  IMPRESSION: 1. Negative for pulmonary embolus or acute aortic abnormality. 2. Osteolytic lesion destroying the sternal manubrium with extraosseous extension of tumor  into the anterior mediastinum. 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 In this location, the most common lesion is a chondrosarcoma however this does not have typical features of a chondrosarcoma and either metastatic disease, lymphoma or myeloma are more likely.   Electronically Signed   By: Dereck Ligas M.D.   On: 01/30/2014 18:43    Oren Binet, MD  [facility] Pager:[phone]  If 7PM-7AM, please contact night-coverage [email or web address] Password [email or web address] 02/01/2014, 11:18 AM   LOS: 2 days

## 2014-02-02 ENCOUNTER — Inpatient Hospital Stay (HOSPITAL_COMMUNITY): Payer: Medicaid Other

## 2014-02-02 ENCOUNTER — Inpatient Hospital Stay (HOSPITAL_COMMUNITY): Payer: Self-pay

## 2014-02-02 ENCOUNTER — Encounter (HOSPITAL_COMMUNITY): Payer: Self-pay | Admitting: Radiology

## 2014-02-02 LAB — HIV ANTIBODY (ROUTINE TESTING W REFLEX): HIV: NONREACTIVE

## 2014-02-02 LAB — BASIC METABOLIC PANEL
ANION GAP: 3 — AB (ref 5–15)
BUN: 16 mg/dL (ref 6–23)
CALCIUM: 10.2 mg/dL (ref 8.4–10.5)
CO2: 30 mmol/L (ref 19–32)
CREATININE: 1.61 mg/dL — AB (ref 0.50–1.35)
Chloride: 101 mEq/L (ref 96–112)
GFR calc Af Amer: 61 mL/min — ABNORMAL LOW (ref >90)
GFR calc non Af Amer: 52 mL/min — ABNORMAL LOW (ref >90)
Glucose, Bld: 102 mg/dL — ABNORMAL HIGH (ref 70–99)
Potassium: 4 mmol/L (ref 3.5–5.1)
SODIUM: 134 mmol/L — AB (ref 135–145)

## 2014-02-02 MED ORDER — TECHNETIUM TC 99M MEDRONATE IV KIT
25.0000 | PACK | Freq: Once | INTRAVENOUS | Status: AC | PRN
  Administered 2014-02-02: 25 via INTRAVENOUS

## 2014-02-02 NOTE — Progress Notes (Signed)
PATIENT DETAILS Name: Charles [name] Age: 39 y.o. 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 Discussed with Radiology, will get a Whole body scan to look for other lesions that are easily accessible/less risk for bx. Await SPEP/UPEP/HIV.LDH not significantly elevated.Supportive care.    Acute vs Chronic Renal Failure stage 3: Initially suspected to be prerenal azotemia secondary to NSAID use. Unfortuantely no improvement in creatinine with IVF. Clinically euvolmic, will stop IVF and follow lytes. Check renal ultrasound.. No proteinuria on UA. Given lytic bone lesion with Renal failure-Myeloma always a possibility.  Disposition: Remain inpatient  Antibiotics:  None   Anti-infectives    None     DVT Prophylaxis: Prophylactic Heparin  Code Status: Full code   Family Communication Spouse at bedside  Procedures:  None  CONSULTS:  IR  MEDICATIONS: Scheduled Meds: . heparin  5,000 Units Subcutaneous 3 times per day   Continuous Infusions: . dextrose 5 % and 0.9% NaCl 75 mL/hr at 02/02/14 0632   PRN Meds:.acetaminophen **OR** acetaminophen, HYDROmorphone (DILAUDID) injection, ondansetron **OR** ondansetron (ZOFRAN) IV, oxyCODONE    PHYSICAL EXAM: Vital signs in last 24 hours: Filed Vitals:   02/01/14 0548 02/01/14 1421 02/01/14 1936 02/02/14 0454  BP: 115/79 149/87 123/91 125/85  Pulse: 84 97 89 73  Temp: 98.2 F (36.8 C) 99.3 F (37.4 C) 99.2 F (37.3 C) 98.8 F (37.1 C)  TempSrc: Oral Oral Oral Oral  Resp: 18 20 20 20   SpO2: 96% 96% 94% 98%    Weight change:  There were no vitals filed for this visit. There is no weight on file to calculate BMI.   Gen Exam: Awake and alert with clear speech.  Neck: Supple, No JVD.     Chest: B/L Clear.  No rales or rhonchi CVS: S1 S2 Regular, no murmurs.  Abdomen: soft, BS +, non tender, non distended.  Extremities: no edema, lower extremities warm to touch. Neurologic: Non Focal.   Skin: No Rash.   Wounds: N/A.    Intake/Output from previous day:  Intake/Output Summary (Last 24 hours) at 02/02/14 0858 Last data filed at 02/01/14 2100  Gross per 24 hour  Intake    360 ml  Output      0 ml  Net    360 ml     LAB RESULTS: CBC  Recent Labs Lab 01/30/14 1401 01/31/14 0515 01/31/14 0525  WBC 12.4* 10.2 10.4  HGB 12.5* 11.4* 11.4*  HCT 37.9* 34.7* 34.5*  PLT 371 336 349  MCV 94.8 93.0 92.7  MCH 31.3 30.6 30.6  MCHC 33.0 32.9 33.0  RDW 13.0 13.1 13.1    Chemistries   Recent Labs Lab 01/30/14 1401 01/31/14 0515 01/31/14 0525 02/01/14 0752 02/02/14 0504  NA 134*  --  136 135 134*  K 3.8  --  3.9 3.9 4.0  CL 100  --  101 102 101  CO2 28  --  28 27 30   GLUCOSE 97  --  139* 98 102*  BUN 19  --  18 12 16   CREATININE 1.64* 1.68* 1.64* 1.44* 1.61*  CALCIUM 9.4  --  9.9 9.8 10.2    CBG:  Recent Labs Lab 01/31/14 2148 02/01/14 0620  GLUCAP 112* 99    GFR CrCl cannot be calculated (Unknown ideal  weight.).  Coagulation profile  Recent Labs Lab 01/31/14 0525  INR 1.18    Cardiac Enzymes No results for input(s): CKMB, TROPONINI, MYOGLOBIN in the last 168 hours.  Invalid input(s): CK  Invalid input(s): POCBNP  Recent Labs  01/30/14 1458  DDIMER 0.67*   No results for input(s): HGBA1C in the last 72 hours. No results for input(s): CHOL, HDL, LDLCALC, TRIG, CHOLHDL, LDLDIRECT in the last 72 hours. No results for input(s): TSH, T4TOTAL, T3FREE, THYROIDAB in the last 72 hours.  Invalid input(s): FREET3 No results for input(s): VITAMINB12, FOLATE, FERRITIN, TIBC, IRON, RETICCTPCT in the last 72 hours. No results for input(s): LIPASE, AMYLASE in the last 72 hours.  Urine Studies No results for input(s): UHGB, CRYS in the last  72 hours.  Invalid input(s): UACOL, UAPR, USPG, UPH, UTP, UGL, UKET, UBIL, UNIT, UROB, ULEU, UEPI, UWBC, URBC, UBAC, CAST, UCOM, BILUA  MICROBIOLOGY: No results found for this or any previous visit (from the past 240 hour(s)).  RADIOLOGY STUDIES/RESULTS: Dg Chest 2 View  01/30/2014   CLINICAL DATA:  Chest pain for several weeks.  Shortness of breath.  EXAM: CHEST  2 VIEW  COMPARISON:  01/05/2014  FINDINGS: The cardiac silhouette, mediastinal and hilar contours are within normal limits and stable. Low lung volumes with vascular crowding and atelectasis. Vague densities in both lungs are most likely areas of atelectasis but. No infiltrate or effusion.  IMPRESSION: Low lung volumes with vascular crowding and streaky areas of atelectasis.   Electronically Signed   By: Kalman Jewels M.D.   On: 01/30/2014 14:50   Dg Chest 2 View  01/05/2014   CLINICAL DATA:  Initial evaluation chest wall pain  EXAM: CHEST  2 VIEW  COMPARISON:  None.  FINDINGS: The heart size and mediastinal contours are within normal limits. Both lungs are clear. The visualized skeletal structures are unremarkable.  IMPRESSION: No active cardiopulmonary disease.   Electronically Signed   By: Skipper Cliche M.D.   On: 01/05/2014 17:44   Ct Angio Chest Pe W/cm &/or Wo Cm  01/30/2014   CLINICAL DATA:  Right-sided chest pain for several weeks. Coughing. Pulmonary embolism.  EXAM: CT ANGIOGRAPHY CHEST WITH CONTRAST  TECHNIQUE: Multidetector CT imaging of the chest was performed using the standard protocol during bolus administration of intravenous contrast. Multiplanar CT image reconstructions and MIPs were obtained to evaluate the vascular anatomy.  CONTRAST:  52mL OMNIPAQUE IOHEXOL 350 MG/ML SOLN  COMPARISON:  01/30/2014 chest radiograph.  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 There is erosion of both the anterior and posterior cortex and cortical per made of osteolysis is present. There is retrosternal  extension of the manubrial mass, with soft tissue density in the anterior mediastinum measuring 37 mm transverse by 14 mm AP. Despite the anterior sternal manubrium osteolysis, there does not appear to be anterior extraosseous extension of tumor.  The sternal body appears within normal limits. No destructive rib lesions are visible. Thoracic spine appears within normal limits. No supraclavicular adenopathy is visible.  Cardiovascular: Negative for pulmonary embolism. Technically adequate study. The heart appears normal.  Lungs: Atelectasis.  No evidence of pulmonary metastatic disease.  Central airways: Patent.  Effusions: None.  Lymphadenopathy: No axillary adenopathy. No mediastinal or hilar adenopathy.  Esophagus: Normal.  Upper abdomen: Normal.  Other: Thyroid gland shows tiny low-density lesions that probably represents cysts.  Review of the MIP images confirms the above findings.  IMPRESSION: 1. Negative for pulmonary embolus or acute aortic abnormality. 2.  Osteolytic lesion destroying the sternal manubrium with extraosseous extension of tumor into the anterior mediastinum. 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 In this location, the most common lesion is a chondrosarcoma however this does not have typical features of a chondrosarcoma and either metastatic disease, lymphoma or myeloma are more likely.   Electronically Signed   By: Dereck Ligas M.D.   On: 01/30/2014 18:43    Oren Binet, MD  [facility] Pager:[phone]  If 7PM-7AM, please contact night-coverage [email or web address] Password [email or web address] 02/02/2014, 8:58 AM   LOS: 3 days

## 2014-02-02 NOTE — Progress Notes (Signed)
Referring Physician(s): Dr. Oren Binet  Subjective: 39 yo hispanic male with about 7-8 week hx of odd chest and back pain. Has been admitted and workup shows lytic lesion of the sternum with some extension to the anterior mediastinum. IR is asked to eval for biopsy. Images reviewed and recommended to have Whole body NM bone scan to see if this is a solitary lesion of if there are additional areas of concern that also might offer biopsy site. Chart, PMHx, meds, labs reviewed.  Allergies: Review of patient's allergies indicates no known allergies.  Medications: Prior to Admission medications   Medication Sig Start Date End Date Taking? Authorizing Provider  meloxicam (MOBIC) 7.5 MG tablet Take 1 tablet (7.5 mg total) by mouth daily. 01/05/14  Yes Wendie Agreste, MD  Menthol, Topical Analgesic, (ICY HOT EX) Apply 1 application topically daily as needed (pain).   Yes Historical Provider, MD    Review of Systems  Vital Signs: BP 125/85 mmHg  Pulse 73  Temp(Src) 98.8 F (37.1 C) (Oral)  Resp 20  SpO2 98%  Physical Exam Gen: A&O x 3, NAD ENT: unremarkable airway Lungs: CTA without w/r/r Heart: Regular   Imaging: Dg Chest 2 View  01/30/2014   CLINICAL DATA:  Chest pain for several weeks.  Shortness of breath.  EXAM: CHEST  2 VIEW  COMPARISON:  01/05/2014  FINDINGS: The cardiac silhouette, mediastinal and hilar contours are within normal limits and stable. Low lung volumes with vascular crowding and atelectasis. Vague densities in both lungs are most likely areas of atelectasis but. No infiltrate or effusion.  IMPRESSION: Low lung volumes with vascular crowding and streaky areas of atelectasis.   Electronically Signed   By: Kalman Jewels M.D.   On: 01/30/2014 14:50   Ct Angio Chest Pe W/cm &/or Wo Cm  01/30/2014   CLINICAL DATA:  Right-sided chest pain for several weeks. Coughing. Pulmonary embolism.  EXAM: CT ANGIOGRAPHY CHEST WITH CONTRAST  TECHNIQUE: Multidetector CT  imaging of the chest was performed using the standard protocol during bolus administration of intravenous contrast. Multiplanar CT image reconstructions and MIPs were obtained to evaluate the vascular anatomy.  CONTRAST:  54mL OMNIPAQUE IOHEXOL 350 MG/ML SOLN  COMPARISON:  01/30/2014 chest radiograph.  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 There is erosion of both the anterior and posterior cortex and cortical per made of osteolysis is present. There is retrosternal extension of the manubrial mass, with soft tissue density in the anterior mediastinum measuring 37 mm transverse by 14 mm AP. Despite the anterior sternal manubrium osteolysis, there does not appear to be anterior extraosseous extension of tumor.  The sternal body appears within normal limits. No destructive rib lesions are visible. Thoracic spine appears within normal limits. No supraclavicular adenopathy is visible.  Cardiovascular: Negative for pulmonary embolism. Technically adequate study. The heart appears normal.  Lungs: Atelectasis.  No evidence of pulmonary metastatic disease.  Central airways: Patent.  Effusions: None.  Lymphadenopathy: No axillary adenopathy. No mediastinal or hilar adenopathy.  Esophagus: Normal.  Upper abdomen: Normal.  Other: Thyroid gland shows tiny low-density lesions that probably represents cysts.  Review of the MIP images confirms the above findings.  IMPRESSION: 1. Negative for pulmonary embolus or acute aortic abnormality. 2. Osteolytic lesion destroying the sternal manubrium with extraosseous extension of tumor into the anterior mediastinum. Based on the permeative changes in the sternal manubrium, this is favored to represent a malignancy originating in bone with extraosseous soft tissue extension rather  than extension from an anterior mediastinal mass. In this location, the most common lesion is a chondrosarcoma however this does not have typical features of a chondrosarcoma and  either metastatic disease, lymphoma or myeloma are more likely.   Electronically Signed   By: Dereck Ligas M.D.   On: 01/30/2014 18:43    Labs:  CBC:  Recent Labs  01/30/14 1401 01/31/14 0515 01/31/14 0525  WBC 12.4* 10.2 10.4  HGB 12.5* 11.4* 11.4*  HCT 37.9* 34.7* 34.5*  PLT 371 336 349    COAGS:  Recent Labs  01/31/14 0525  INR 1.18    BMP:  Recent Labs  01/30/14 1401 01/31/14 0515 01/31/14 0525 02/01/14 0752 02/02/14 0504  NA 134*  --  136 135 134*  K 3.8  --  3.9 3.9 4.0  CL 100  --  101 102 101  CO2 28  --  28 27 30   GLUCOSE 97  --  139* 98 102*  BUN 19  --  18 12 16   CALCIUM 9.4  --  9.9 9.8 10.2  CREATININE 1.64* 1.68* 1.64* 1.44* 1.61*  GFRNONAA 51* 50* 51* 60* 52*  GFRAA 59* 58* 59* 69* 61*    LIVER FUNCTION TESTS: No results for input(s): BILITOT, AST, ALT, ALKPHOS, PROT, ALBUMIN in the last 8760 hours.  Assessment and Plan: Lytic bone lesion of the sternum concerning for malignancy Plan for NM bone scan today Tentatively plan for CT guided biopsy of lesion tomorrow Made NPO p MN, hold heparin in am Procedure, risks, complications, use of sedation discussed Consent signed in chart   I spent a total of 20 minutes face to face in clinical consultation/evaluation, greater than 50% of which was counseling/coordinating care for biopsy of lytic bone lesion  Signed: Ascencion Dike 02/02/2014, 10:25 AM

## 2014-02-02 NOTE — Progress Notes (Signed)
MD: does pt have to be NPO tonight....having bone biopsy tomorrow 02/03/14?? Thanks, [employer]

## 2014-02-03 ENCOUNTER — Inpatient Hospital Stay (HOSPITAL_COMMUNITY): Payer: Medicaid Other

## 2014-02-03 DIAGNOSIS — C9000 Multiple myeloma not having achieved remission: Principal | ICD-10-CM

## 2014-02-03 LAB — COMPREHENSIVE METABOLIC PANEL
ALT: 18 U/L (ref 0–53)
ANION GAP: 6 (ref 5–15)
AST: 23 U/L (ref 0–37)
Albumin: 2 g/dL — ABNORMAL LOW (ref 3.5–5.2)
Alkaline Phosphatase: 104 U/L (ref 39–117)
BILIRUBIN TOTAL: 0.3 mg/dL (ref 0.3–1.2)
BUN: 21 mg/dL (ref 6–23)
CO2: 27 mmol/L (ref 19–32)
CREATININE: 1.66 mg/dL — AB (ref 0.50–1.35)
Calcium: 10.8 mg/dL — ABNORMAL HIGH (ref 8.4–10.5)
Chloride: 102 mEq/L (ref 96–112)
GFR calc Af Amer: 59 mL/min — ABNORMAL LOW (ref >90)
GFR calc non Af Amer: 50 mL/min — ABNORMAL LOW (ref >90)
GLUCOSE: 107 mg/dL — AB (ref 70–99)
Potassium: 4 mmol/L (ref 3.5–5.1)
Sodium: 135 mmol/L (ref 135–145)
Total Protein: 8.4 g/dL — ABNORMAL HIGH (ref 6.0–8.3)

## 2014-02-03 LAB — CBC
HCT: 34.9 % — ABNORMAL LOW (ref 39.0–52.0)
Hemoglobin: 11.7 g/dL — ABNORMAL LOW (ref 13.0–17.0)
MCH: 31 pg (ref 26.0–34.0)
MCHC: 33.5 g/dL (ref 30.0–36.0)
MCV: 92.3 fL (ref 78.0–100.0)
Platelets: 335 10*3/uL (ref 150–400)
RBC: 3.78 MIL/uL — AB (ref 4.22–5.81)
RDW: 13 % (ref 11.5–15.5)
WBC: 9.5 10*3/uL (ref 4.0–10.5)

## 2014-02-03 LAB — BONE MARROW EXAM

## 2014-02-03 LAB — SAVE SMEAR

## 2014-02-03 MED ORDER — MIDAZOLAM HCL 2 MG/2ML IJ SOLN
INTRAMUSCULAR | Status: AC | PRN
  Administered 2014-02-03: 1 mg via INTRAVENOUS

## 2014-02-03 MED ORDER — FENTANYL CITRATE 0.05 MG/ML IJ SOLN
INTRAMUSCULAR | Status: AC
  Filled 2014-02-03: qty 4

## 2014-02-03 MED ORDER — MIDAZOLAM HCL 2 MG/2ML IJ SOLN
INTRAMUSCULAR | Status: AC | PRN
  Administered 2014-02-03 (×2): 1 mg via INTRAVENOUS

## 2014-02-03 MED ORDER — SODIUM CHLORIDE 0.9 % IV BOLUS (SEPSIS)
500.0000 mL | Freq: Once | INTRAVENOUS | Status: AC
  Administered 2014-02-03: 500 mL via INTRAVENOUS

## 2014-02-03 MED ORDER — HYDROCODONE-ACETAMINOPHEN 5-325 MG PO TABS: 1.0000 | ORAL_TABLET | ORAL | Status: DC | PRN

## 2014-02-03 MED ORDER — FENTANYL CITRATE 0.05 MG/ML IJ SOLN
INTRAMUSCULAR | Status: AC | PRN
  Administered 2014-02-03: 50 ug via INTRAVENOUS

## 2014-02-03 MED ORDER — MIDAZOLAM HCL 2 MG/2ML IJ SOLN
INTRAMUSCULAR | Status: AC
  Filled 2014-02-03: qty 4

## 2014-02-03 MED ORDER — LIDOCAINE HCL 1 % IJ SOLN
INTRAMUSCULAR | Status: AC
  Filled 2014-02-03: qty 40

## 2014-02-03 MED ORDER — FENTANYL CITRATE 0.05 MG/ML IJ SOLN
INTRAMUSCULAR | Status: AC | PRN
  Administered 2014-02-03 (×2): 50 ug via INTRAVENOUS

## 2014-02-03 NOTE — Progress Notes (Signed)
Patient ID: Charles [name], male   DOB: [birth year], 39 y.o.   MRN: [id number]   Pt was tentatively scheduled for sternal lesion biopsy in IR today  Bone scan has been performed yesterday and reviewed by Dr Kathlene Cote this am Multiple bony lesions noted Discussed with Dr Sloan Leiter Oncology has been consulted Feel possible Multiple Myeloma  Rec: CT Abd/Pel without contrast now         Plan bony lesion biopsy with Bone Marrow bx today  Pt and wife aware of plan and agreeable New consent signed andin chart

## 2014-02-03 NOTE — Sedation Documentation (Signed)
Patient is resting comfortably. 

## 2014-02-03 NOTE — Sedation Documentation (Signed)
Pt rolled to supine position and tolerated well. Mild grimacing noted. VSS.

## 2014-02-03 NOTE — Sedation Documentation (Signed)
Discussed pain level w/ Dr. Kathlene Cote. However, pt lying still no moaning or grimacing noted at this point.

## 2014-02-03 NOTE — Sedation Documentation (Signed)
Patient denies pain and is resting comfortably.  

## 2014-02-03 NOTE — Sedation Documentation (Signed)
Patient is resting comfortably.  Awaiting transport.

## 2014-02-03 NOTE — Progress Notes (Signed)
PATIENT DETAILS Name: Charles [name] Age: 39 y.o. Sex: male Date of Birth: May 27, 1975 Admit Date: 01/30/2014 Admitting Physician Merton Border, MD PCP:No PCP Per Patient  Subjective: No major complaints  Assessment/Plan: Active Problems:   Lytic Sternal bone lesion seen on CT Chest:suspicion for Multiple lesion-given constellation of lytic bony lesion, hypercalcemia and CKD. IR consulted-for bone marrow bx and bx of a bony lytic lesion on 02/03/14. Await SPEP/UPEP. HIV negative,LDH not significantly elevated.Oncology-Dr Magrinat consulted.  Chronic Renal Failure stage 3: Initially suspected to be prerenal azotemia secondary to NSAID use. Unfortuantely no improvement in creatinine with IVF. Renal ultrasound negative for hydronephroses. Clinically euvolmic,  stopped IVF No proteinuria on UA. Given lytic bone lesion with Renal failure-Myeloma always a possibility.  Hypercalcemia:suspect from Myeloma-suspect this will get better with treatment of underlying malignancy.  Disposition: Remain inpatient  Antibiotics:  None   Anti-infectives    None     DVT Prophylaxis: Prophylactic Heparin  Code Status: Full code   Family Communication Spouse at bedside  Procedures:  None  CONSULTS:  IR   Oncology  MEDICATIONS: Scheduled Meds: . fentaNYL      . heparin  5,000 Units Subcutaneous 3 times per day  . lidocaine      . midazolam       Continuous Infusions: . dextrose 5 % and 0.9% NaCl 75 mL/hr at 02/02/14 0632   PRN Meds:.acetaminophen **OR** acetaminophen, HYDROmorphone (DILAUDID) injection, ondansetron **OR** ondansetron (ZOFRAN) IV, oxyCODONE    PHYSICAL EXAM: Vital signs in last 24 hours: Filed Vitals:   02/02/14 1550 02/02/14 2036 02/03/14 0629 02/03/14 0958  BP: 146/91 136/78 131/83 138/84  Pulse: 100 94 80 99  Temp: 99 F (37.2 C) 99.3 F (37.4 C) 98.9 F (37.2 C)   TempSrc: Oral Oral Oral   Resp: 18 18 18 25   SpO2: 97% 95% 94% 96%     Weight change:  There were no vitals filed for this visit. There is no weight on file to calculate BMI.   Gen Exam: Awake and alert with clear speech.  Neck: Supple, No JVD.   Chest: B/L Clear.  No rales or rhonchi CVS: S1 S2 Regular, no murmurs.  Abdomen: soft, BS +, non tender, non distended.  Extremities: no edema, lower extremities warm to touch. Neurologic: Non Focal.   Skin: No Rash.   Wounds: N/A.    Intake/Output from previous day:  Intake/Output Summary (Last 24 hours) at 02/03/14 1026 Last data filed at 02/02/14 2300  Gross per 24 hour  Intake    720 ml  Output      0 ml  Net    720 ml     LAB RESULTS: CBC  Recent Labs Lab 01/30/14 1401 01/31/14 0515 01/31/14 0525 02/03/14 0625  WBC 12.4* 10.2 10.4 9.5  HGB 12.5* 11.4* 11.4* 11.7*  HCT 37.9* 34.7* 34.5* 34.9*  PLT 371 336 349 335  MCV 94.8 93.0 92.7 92.3  MCH 31.3 30.6 30.6 31.0  MCHC 33.0 32.9 33.0 33.5  RDW 13.0 13.1 13.1 13.0    Chemistries   Recent Labs Lab 01/30/14 1401 01/31/14 0515 01/31/14 0525 02/01/14 0752 02/02/14 0504 02/03/14 0625  NA 134*  --  136 135 134* 135  K 3.8  --  3.9 3.9 4.0 4.0  CL 100  --  101 102 101 102  CO2 28  --  28 27 30 27   GLUCOSE 97  --  139* 98 102* 107*  BUN 19  --  18 12 16 21   CREATININE 1.64* 1.68* 1.64* 1.44* 1.61* 1.66*  CALCIUM 9.4  --  9.9 9.8 10.2 10.8*    CBG:  Recent Labs Lab 01/31/14 2148 02/01/14 0620  GLUCAP 112* 99    GFR CrCl cannot be calculated (Unknown ideal weight.).  Coagulation profile  Recent Labs Lab 01/31/14 0525  INR 1.18    Cardiac Enzymes No results for input(s): CKMB, TROPONINI, MYOGLOBIN in the last 168 hours.  Invalid input(s): CK  Invalid input(s): POCBNP No results for input(s): DDIMER in the last 72 hours. No results for input(s): HGBA1C in the last 72 hours. No results for input(s): CHOL, HDL, LDLCALC, TRIG, CHOLHDL, LDLDIRECT in the last 72 hours. No results for input(s): TSH, T4TOTAL,  T3FREE, THYROIDAB in the last 72 hours.  Invalid input(s): FREET3 No results for input(s): VITAMINB12, FOLATE, FERRITIN, TIBC, IRON, RETICCTPCT in the last 72 hours. No results for input(s): LIPASE, AMYLASE in the last 72 hours.  Urine Studies No results for input(s): UHGB, CRYS in the last 72 hours.  Invalid input(s): UACOL, UAPR, USPG, UPH, UTP, UGL, UKET, UBIL, UNIT, UROB, ULEU, UEPI, UWBC, URBC, UBAC, CAST, UCOM, BILUA  MICROBIOLOGY: No results found for this or any previous visit (from the past 240 hour(s)).  RADIOLOGY STUDIES/RESULTS: Dg Chest 2 View  01/30/2014   CLINICAL DATA:  Chest pain for several weeks.  Shortness of breath.  EXAM: CHEST  2 VIEW  COMPARISON:  01/05/2014  FINDINGS: The cardiac silhouette, mediastinal and hilar contours are within normal limits and stable. Low lung volumes with vascular crowding and atelectasis. Vague densities in both lungs are most likely areas of atelectasis but. No infiltrate or effusion.  IMPRESSION: Low lung volumes with vascular crowding and streaky areas of atelectasis.   Electronically Signed   By: Kalman Jewels M.D.   On: 01/30/2014 14:50   Dg Chest 2 View  01/05/2014   CLINICAL DATA:  Initial evaluation chest wall pain  EXAM: CHEST  2 VIEW  COMPARISON:  None.  FINDINGS: The heart size and mediastinal contours are within normal limits. Both lungs are clear. The visualized skeletal structures are unremarkable.  IMPRESSION: No active cardiopulmonary disease.   Electronically Signed   By: Skipper Cliche M.D.   On: 01/05/2014 17:44   Ct Angio Chest Pe W/cm &/or Wo Cm  01/30/2014   CLINICAL DATA:  Right-sided chest pain for several weeks. Coughing. Pulmonary embolism.  EXAM: CT ANGIOGRAPHY CHEST WITH CONTRAST  TECHNIQUE: Multidetector CT imaging of the chest was performed using the standard protocol during bolus administration of intravenous contrast. Multiplanar CT image reconstructions and MIPs were obtained to evaluate the vascular  anatomy.  CONTRAST:  53m OMNIPAQUE IOHEXOL 350 MG/ML SOLN  COMPARISON:  01/30/2014 chest radiograph.  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 There is erosion of both the anterior and posterior cortex and cortical per made of osteolysis is present. There is retrosternal extension of the manubrial mass, with soft tissue density in the anterior mediastinum measuring 37 mm transverse by 14 mm AP. Despite the anterior sternal manubrium osteolysis, there does not appear to be anterior extraosseous extension of tumor.  The sternal body appears within normal limits. No destructive rib lesions are visible. Thoracic spine appears within normal limits. No supraclavicular adenopathy is visible.  Cardiovascular: Negative for pulmonary embolism. Technically adequate study. The heart appears normal.  Lungs: Atelectasis.  No evidence of pulmonary metastatic disease.  Central airways: Patent.  Effusions:  None.  Lymphadenopathy: No axillary adenopathy. No mediastinal or hilar adenopathy.  Esophagus: Normal.  Upper abdomen: Normal.  Other: Thyroid gland shows tiny low-density lesions that probably represents cysts.  Review of the MIP images confirms the above findings.  IMPRESSION: 1. Negative for pulmonary embolus or acute aortic abnormality. 2. Osteolytic lesion destroying the sternal manubrium with extraosseous extension of tumor into the anterior mediastinum. 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 In this location, the most common lesion is a chondrosarcoma however this does not have typical features of a chondrosarcoma and either metastatic disease, lymphoma or myeloma are more likely.   Electronically Signed   By: Dereck Ligas M.D.   On: 01/30/2014 18:43   Nm Bone Scan Whole Body  02/02/2014   CLINICAL DATA:  Evaluate  bone lesions seen on chest CT.  EXAM: NUCLEAR MEDICINE WHOLE BODY BONE SCAN  TECHNIQUE: Whole body anterior and posterior images were obtained approximately 3 hours after intravenous injection of radiopharmaceutical.  RADIOPHARMACEUTICALS:  25.0 MCi Technetium-99 MDP  COMPARISON:  Chest CT 01/30/2014  FINDINGS: There are numerous bone lesions involving the skull, ribs, sternum, spine and pelvis. Although bone scan is a 50/50 proposition with multiple myeloma the pattern on the chest CT and and numerous skull lesions would still favor that diagnosis. Metastatic disease is also possible. I think this is unlikely lymphoma.  IMPRESSION: Numerous bone lesions, particularly involving the skull. Favor multiple myeloma.   Electronically Signed   By: Kalman Jewels M.D.   On: 02/02/2014 12:38   US Renal  02/02/2014   CLINICAL DATA:  Acute versus chronic stage 3 kidney disease.  EXAM: RENAL/URINARY TRACT ULTRASOUND COMPLETE  COMPARISON:  None.  FINDINGS: Right Kidney:  Length: 11.7 cm. The kidney demonstrates normal renal cortical thickness but there is slight increased echogenicity suggesting medical renal disease. No hydronephrosis, renal mass or renal calculi.  Left Kidney:  Length: 12.1 cm. Normal renal cortical thickness but slight increased echogenicity suggesting medical renal disease. No hydronephrosis, renal mass or renal calculi.  Bladder:  Normal.  IMPRESSION: Normal size and cortical thickness of both kidneys.  Mild diffuse increased echogenicity suggesting medical renal disease.  No hydronephrosis or renal mass.   Electronically Signed   By: Kalman Jewels M.D.   On: 02/02/2014 12:14    Oren Binet, MD  [facility] Pager:[phone]  If 7PM-7AM, please contact night-coverage [email or web address] Password [email or web address] 02/03/2014, 10:26 AM   LOS: 4 days

## 2014-02-03 NOTE — Sedation Documentation (Signed)
Dr. Kathlene Cote in to s/w pt and family regarding procedure. Questions answered. Wife did some interpreting for patient.

## 2014-02-03 NOTE — Procedures (Signed)
Procedure:  CT guided right iliac bone marrow biopsy and sternal mass biopsy Findings:  Right iliac bone marrow aspirate and core biopsy performed from posterior approach. Additional 11 G core biopsy under CT guidance of upper sternal/manubrial mass.  2 samples sent for Path. Mild soft tissue thickening/bleeding just deep to sternum on post biopsy images.

## 2014-02-03 NOTE — Consult Note (Signed)
[place]  [phone]   HEMATOLOGY ONCOLOGY CONSULTATION   Fox Slaght  DOB: 03/06/1975  MR#: [id number]  CSN#: [id number]    Requesting Physician: [facility]  Patient Care Team: No Pcp Per Patient as PCP - General (General Practice)   Reason for consult: Metastatic bone lesions  Consulting Provider: Lurline Del, MD  History of present illness:    [name] is a pleasant 39 year old [place] man with no prior medical history, admitted on 01/30/14 with seven week history of pleuritic chest pain not amenable to outpatient management with muscle relaxers and pain meds, a 10 pound weight loss over the last 5 weeks, failure to thrive and generalized weakness. He denies any shortness of breath, cough, hemoptysis, palpitations or dizziness. He denies any nausea or vomiting, or abdominal pain. He denies any headaches or double vision. He denies andy fever, but did have some chills ans night sweats over that period. He never smoked and does not partake alcohol or recreational drugs. Denies risk for Hepatitis or HIV. Patient denies history of recurrent infection or atypical infections such as shingles of meningitis. Patient denies any history or family history of bone marrow disorders. Never had a bone marrow biopsy in the past. Denies any prior history of abnormal bone pain or bone fracture.  CT angio of the chest with contrast on 12/31 was negative for PE but  revealed an osteolytic lesion of 3.7 by 1.4 cm destroying the sternal manubrium with extraosseous extension of tumor into the anterior mediastinum suspicious for malignancy originating in bone with extraosseous soft tissue extension. Bone scan on 1/3 showed multiple bone lesions involving the skull, ribs, sternum, spine and pelvis favoring multiple myeloma versus other primary malignancy. CT of the abdomen and pelvis without contrast on 1/4 is pending. He underwent biopsy of the lytic pelvic  lesion and bone marrow biopsy today by IR with results pending.  Other lab work was remarkable for acute versus chronic stage 3 renal insufficiency without hydronephrosis per renal ultrasound, mild anemia and hypercalcemia, with current Ca at 10.8. SPEP/UPEP with immunofixation is pending. Peripheral blood smear ordered for review.  Oncology consultation has been requested anticipating that the patient will require treatment  Past medical history:   Hypercholesterolemia  Past surgical history:     History reviewed. No pertinent past surgical history.  Medications:   VOZ:[id number] **OR** acetaminophen, HYDROmorphone (DILAUDID) injection, ondansetron **OR** ondansetron (ZOFRAN) IV, oxyCODONE  Scheduled Meds: . fentaNYL      . heparin  5,000 Units Subcutaneous 3 times per day  . lidocaine      . midazolam       Continuous Infusions: . dextrose 5 % and 0.9% NaCl 75 mL/hr at 02/02/14 5638    Allergies: No Known Allergies  Family history:  Mother has cataracts. Father has diabetes. Grandparents history unknown by patient. Has 5 siblings in good health  ROS: Constitutional: Denies fevers, but reports intermittent chills or abnormal night sweats. Denies polyuria, polydipsia. Eyes: Denies blurriness of vision, double vision or watery eyes Ears, nose, mouth, throat, and face: Denies mucositis or sore throat Respiratory: Denies cough, dyspnea or wheezes Cardiovascular: Denies palpitation, chest discomfort or lower extremity swelling Gastrointestinal:  Positive for anorexia, negative for nausea, vomiting, bowel hypomotility or constipation. Denies a history of pancreatitis or peptic ulcer disease. Denies diarrhea Skin: Denies abnormal skin rashes Lymphatics: Denies new lymphadenopathy or easy bruising Neurological:Denies numbness, tingling or new weaknesses Musculoskeletal: Reports sternal chest wall pain and upper back  pain for the last month as per HPI. Denies muscle  weakness Behavioral/Psych: Mood is stable, no new changes. No confusion is reported All other systems were reviewed with the patient and are negative.  FAMILY HISTORY: The patient's parents are still living, in their late 58's. The patient has 2 brothers, 3 sisters. There is noc cancer in the fmaily to his knowledge.  Social History:  reports that he has never smoked. He has never used smokeless tobacco. He reports that he does not drink alcohol or use illicit drugs. Married (wife's name is [name]), lives in [place]. He has 5 children ages 13, 33, 33, 49 and 61 months [name], [name], [name] and Magnolia) in good health.  Originally from [place], moved here 19 years ago.  He works in Nurse, adult. Best number to call him is [phone]  Physical Exam    ECOG PERFORMANCE STATUS:1   Filed Vitals:   02/03/14 0629  BP: 131/83  Pulse: 80  Temp: 98.9 F (37.2 C)  Resp: 18   There were no vitals filed for this visit.  GENERAL:alert, no distress and comfortable SKIN: skin color, texture, turgor are normal, no rashes or significant lesions EYES: normal, conjunctiva are pink and non-injected, sclera clear OROPHARYNX:no exudate, no erythema and lips, buccal mucosa, and tongue normal  NECK: supple, thyroid normal size, non-tender, without nodularity LYMPH:  no palpable lymphadenopathy in the cervical, axillary or inguinal LUNGS: clear to auscultation and percussion with normal breathing effort HEART: regular rate & rhythm and no murmurs and no lower extremity edema ABDOMEN:abdomen soft, non-tender and normal bowel sounds Musculoskeletal:no cyanosis of digits and no clubbing. Tenderness to palpation on the right scapular area, right upper chest wall and right sternum PSYCH: alert & oriented x 3 with fluent speech NEURO: no focal motor/sensory deficits   Lab results:       CBC  Recent Labs Lab 01/30/14 1401 01/31/14 0515 01/31/14 0525  WBC 12.4*  10.2 10.4  HGB 12.5* 11.4* 11.4*  HCT 37.9* 34.7* 34.5*  PLT 371 336 349  MCV 94.8 93.0 92.7  MCH 31.3 30.6 30.6  MCHC 33.0 32.9 33.0  RDW 13.0 13.1 13.1    Anemia panel:  No results for input(s): VITAMINB12, FOLATE, FERRITIN, TIBC, IRON, RETICCTPCT in the last 72 hours.   Chemistries   Recent Labs Lab 01/30/14 1401 01/31/14 0515 01/31/14 0525 02/01/14 0752 02/02/14 0504 02/03/14 0625  NA 134*  --  136 135 134* 135  K 3.8  --  3.9 3.9 4.0 4.0  CL 100  --  101 102 101 102  CO2 28  --  28 27 30 27   GLUCOSE 97  --  139* 98 102* 107*  BUN 19  --  18 12 16 21   CREATININE 1.64* 1.68* 1.64* 1.44* 1.61* 1.66*  CALCIUM 9.4  --  9.9 9.8 10.2 10.8*     Coagulation profile  Recent Labs Lab 01/31/14 0525  INR 1.18    Urine Studies No results for input(s): UHGB, CRYS in the last 72 hours.  Invalid input(s): UACOL, UAPR, USPG, UPH, UTP, UGL, UKET, UBIL, UNIT, UROB, ULEU, UEPI, UWBC, URBC, UBAC, CAST, UCOM, BILUA  Studies:      Dg Chest 2 View  01/30/2014   CLINICAL DATA:  Chest pain for several weeks.  Shortness of breath.  EXAM: CHEST  2 VIEW  COMPARISON:  01/05/2014  FINDINGS: The cardiac silhouette, mediastinal and hilar contours are within normal limits and stable. Low lung volumes with  vascular crowding and atelectasis. Vague densities in both lungs are most likely areas of atelectasis but. No infiltrate or effusion.  IMPRESSION: Low lung volumes with vascular crowding and streaky areas of atelectasis.   Electronically Signed   By: Kalman Jewels M.D.   On: 01/30/2014 14:50   Dg Chest 2 View  01/05/2014   CLINICAL DATA:  Initial evaluation chest wall pain  EXAM: CHEST  2 VIEW  COMPARISON:  None.  FINDINGS: The heart size and mediastinal contours are within normal limits. Both lungs are clear. The visualized skeletal structures are unremarkable.  IMPRESSION: No active cardiopulmonary disease.   Electronically Signed   By: Skipper Cliche M.D.   On: 01/05/2014 17:44   Ct  Angio Chest Pe W/cm &/or Wo Cm  01/30/2014    COMPARISON:  01/30/2014 chest radiograph.  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 There is erosion of both the anterior and posterior cortex and cortical per made of osteolysis is present. There is retrosternal extension of the manubrial mass, with soft tissue density in the anterior mediastinum measuring 37 mm transverse by 14 mm AP. Despite the anterior sternal manubrium osteolysis, there does not appear to be anterior extraosseous extension of tumor.  The sternal body appears within normal limits. No destructive rib lesions are visible. Thoracic spine appears within normal limits. No supraclavicular adenopathy is visible.  Cardiovascular: Negative for pulmonary embolism. Technically adequate study. The heart appears normal.  Lungs: Atelectasis.  No evidence of pulmonary metastatic disease.  Central airways: Patent.  Effusions: None.  Lymphadenopathy: No axillary adenopathy. No mediastinal or hilar adenopathy.  Esophagus: Normal.  Upper abdomen: Normal.  Other: Thyroid gland shows tiny low-density lesions that probably represents cysts.  Review of the MIP images confirms the above findings.  IMPRESSION: 1. Negative for pulmonary embolus or acute aortic abnormality. 2. Osteolytic lesion destroying the sternal manubrium with extraosseous extension of tumor into the anterior mediastinum. 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 In this location, the most common lesion is a chondrosarcoma however this does not have typical features of a chondrosarcoma and either metastatic disease, lymphoma or myeloma are more likely.   Electronically Signed   By: Dereck Ligas M.D.   On: 01/30/2014 18:43   Nm Bone Scan Whole Body  02/02/2014   COMPARISON:  Chest CT 01/30/2014   FINDINGS: There are numerous bone lesions involving the skull, ribs, sternum, spine and pelvis. Although bone scan is a 50/50 proposition with multiple myeloma the pattern on the chest CT and and numerous skull lesions would still favor that diagnosis. Metastatic disease is also possible. I think this is unlikely lymphoma.  IMPRESSION: Numerous bone lesions, particularly involving the skull. Favor multiple myeloma.   Electronically Signed   By: Kalman Jewels M.D.   On: 02/02/2014 12:38   US Renal  02/02/2014   COMPARISON:  None.  FINDINGS: Right Kidney:  Length: 11.7 cm. The kidney demonstrates normal renal cortical thickness but there is slight increased echogenicity suggesting medical renal disease. No hydronephrosis, renal mass or renal calculi.  Left Kidney:  Length: 12.1 cm. Normal renal cortical thickness but slight increased echogenicity suggesting medical renal disease. No hydronephrosis, renal mass or renal calculi.  Bladder:  Normal.  IMPRESSION: Normal size and cortical thickness of both kidneys.  Mild diffuse increased echogenicity suggesting medical renal disease.  No hydronephrosis or renal mass.   Electronically Signed   By: Kalman Jewels M.D.   On: 02/02/2014 12:14    Assessmnent/Plan:38 y.o. [place] man with   Lytic Bone Lesions Patient presented with progressive sternal chest wall pain CT angio of the chest was positive for a lytic sternal lesion Bone scan was remarkable for multiple lytic lesions in skull, ribs,sternum, spine and pelvis favoring multiple myeloma versus other primary malignancy. CT of the abdomen and pelvis without contrast on 1/4 is pending.   He had a CT guided right iliac bone marrow biopsy and sternal mass biopsy on 02/03/14. 2 samples sent for Path.  SPEP/UPEP with immunofixation is pending. Once pathology report becomes available, will proceed with recommendations regarding treatment options.  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  May need Zometa if these values rise  Malnutrition Due to malignancy, anemia Consider Nutrition evaluation  DVT prophylaxis On Heparin sq tid  Full Code  WERTMAN,SARA E, PA-C 02/03/2014    ADDENDUM: Agree with above note; in brief:  39 y.o. Spanish speaker admitted with 20 lb weight loss, persistent bony pain, and history of possible fevers, with scans showing multiple bony lesions and lab work showing an elevated globulin fraction.  I spent approximately 55 minutes with the patient and extended family going over his situation. They understand we suspect multiple myeloma but will not have confirmation until today's biopsies have been read. There are other possibilities (infection, prostate cancer, etc) though these appear less likely. We discussed the fact that myeloma is not curable but is very treatable. In this young patient we would consider transplantation, but he will need at least medicaid to be a candidate for referral.  The elevated globulins are toxic to the kidney and it will be important to avoid dehydration. The patient should receive 2-3 quarts of liquid daily. Depending on AM labs, will consider dexamethasone 20  mg/ day x 2 days, given po.  He tells me discharge in AM is being considered. I gave him my card and will make him an appointment to see me this Friday JAN 8 at 2 PM at the cancer center.   I personally saw this patient and performed a substantive portion of this encounter with the listed APP documented above.   [name], MD Medical Oncology and Hematology [facility] [address] [place], [place] [address] Tel. [phone]    Fax. [phone]

## 2014-02-03 NOTE — Sedation Documentation (Signed)
Dr. Kathlene Cote obtaining pictures,

## 2014-02-03 NOTE — Sedation Documentation (Signed)
Patient denies pain and is resting comfortably. Waiting for procedure to begin.  Wife at side

## 2014-02-03 NOTE — Sedation Documentation (Signed)
The timeout for this procedure was performed at the beginning of the previous procedure and and the pt, tech, doctor and nurse did not leave the room between procedures.

## 2014-02-03 NOTE — Progress Notes (Signed)
Utilization review completed. Kendrix Orman, RN, BSN. 

## 2014-02-04 ENCOUNTER — Other Ambulatory Visit: Payer: Self-pay | Admitting: Oncology

## 2014-02-04 DIAGNOSIS — N183 Chronic kidney disease, stage 3 (moderate): Secondary | ICD-10-CM

## 2014-02-04 DIAGNOSIS — C9000 Multiple myeloma not having achieved remission: Secondary | ICD-10-CM

## 2014-02-04 LAB — UIFE/LIGHT CHAINS/TP QN, 24-HR UR
Albumin, U: DETECTED
Alpha 1, Urine: DETECTED — AB
Alpha 2, Urine: DETECTED — AB
Beta, Urine: DETECTED — AB
Gamma Globulin, Urine: DETECTED — AB
TOTAL PROTEIN, URINE-UPE24: 145 mg/dL — AB (ref 5–25)

## 2014-02-04 LAB — PROTEIN ELECTROPHORESIS, SERUM
ALPHA-1-GLOBULIN: 6.2 % — AB (ref 2.9–4.9)
ALPHA-2-GLOBULIN: 20.7 % — AB (ref 7.1–11.8)
Albumin ELP: 33.8 % — ABNORMAL LOW (ref 55.8–66.1)
Beta 2: 2.6 % — ABNORMAL LOW (ref 3.2–6.5)
Beta Globulin: 5 % (ref 4.7–7.2)
Gamma Globulin: 31.7 % — ABNORMAL HIGH (ref 11.1–18.8)
M-Spike, %: 2.22 g/dL
TOTAL PROTEIN ELP: 8.3 g/dL (ref 6.0–8.3)

## 2014-02-04 LAB — COMPREHENSIVE METABOLIC PANEL
ALT: 18 U/L (ref 0–53)
ANION GAP: 6 (ref 5–15)
AST: 24 U/L (ref 0–37)
Albumin: 1.9 g/dL — ABNORMAL LOW (ref 3.5–5.2)
Alkaline Phosphatase: 102 U/L (ref 39–117)
BUN: 20 mg/dL (ref 6–23)
CALCIUM: 10.6 mg/dL — AB (ref 8.4–10.5)
CO2: 26 mmol/L (ref 19–32)
CREATININE: 1.75 mg/dL — AB (ref 0.50–1.35)
Chloride: 103 mEq/L (ref 96–112)
GFR calc non Af Amer: 47 mL/min — ABNORMAL LOW (ref >90)
GFR, EST AFRICAN AMERICAN: 55 mL/min — AB (ref >90)
GLUCOSE: 115 mg/dL — AB (ref 70–99)
Potassium: 4 mmol/L (ref 3.5–5.1)
Sodium: 135 mmol/L (ref 135–145)
Total Bilirubin: 0.4 mg/dL (ref 0.3–1.2)
Total Protein: 7.5 g/dL (ref 6.0–8.3)

## 2014-02-04 MED ORDER — OXYCODONE HCL 5 MG PO TABS: 5.0000 mg | ORAL_TABLET | Freq: Four times a day (QID) | ORAL | Status: DC | PRN

## 2014-02-04 MED ORDER — DEXAMETHASONE 6 MG PO TABS
20.0000 mg | ORAL_TABLET | Freq: Every day | ORAL | Status: DC
  Administered 2014-02-04: 20 mg via ORAL
  Filled 2014-02-04 (×3): qty 1

## 2014-02-04 MED ORDER — DEXAMETHASONE 4 MG PO TABS: 20.0000 mg | ORAL_TABLET | Freq: Once | ORAL | Status: AC

## 2014-02-04 MED ORDER — ACETAMINOPHEN 325 MG PO TABS: 650.0000 mg | ORAL_TABLET | Freq: Four times a day (QID) | ORAL | Status: DC | PRN

## 2014-02-04 NOTE — Discharge Summary (Addendum)
PATIENT DETAILS Name: Charles [name] Age: 39 y.o. Sex: male Date of Birth: [birth year] MRN: [id number]. Admitting Physician: Merton Border, MD OVF:IEPPIR, Gabrielle Dare, MD  Admit Date: 01/30/2014 Discharge date: 02/04/2014  Recommendations for Outpatient Follow-up:  1. Follow Bone marrow bx and sternal bx report 2. Check Electrolytes at next visit to assess renal function. 3. 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 No pertinent past medical history.  DISCHARGE MEDICATIONS: Current Discharge Medication List    START taking these medications   Details  acetaminophen (TYLENOL) 325 MG tablet Take 2 tablets (650 mg total) by mouth every 6 (six) hours as needed for mild pain (or Fever >/= 101).    dexamethasone (DECADRON) 4 MG tablet Take 5 tablets (20 mg total) by mouth once. Take 5 tablets (20 mg) once on 02/04/14 only. Qty: 5 tablet, Refills: 0    oxyCODONE (OXY IR/ROXICODONE) 5 MG immediate release tablet Take 1 tablet (5 mg total) by mouth every 6 (six) hours as needed for moderate pain. Qty: 20 tablet, Refills: 0      STOP taking these medications     meloxicam (MOBIC) 7.5 MG tablet      Menthol, Topical Analgesic, (ICY HOT EX)         ALLERGIES:  No Known Allergies  BRIEF HPI:  See H&P, Labs, Consult and Test reports for all details in brief, patient was admitted for chest pain, CTA Chest showed lytic bone lesion in the sternum  CONSULTATIONS:   hematology/oncology and IR  PERTINENT RADIOLOGIC STUDIES: Ct Abdomen Pelvis Wo Contrast  02/03/2014   CLINICAL DATA:  Sternal lesion and other bone lesions by bone scan. Imaging is performed prior to planned bone marrow biopsy and potential biopsy of either the sternal lesion or other bony lesions.  EXAM: CT ABDOMEN AND  PELVIS WITHOUT CONTRAST  TECHNIQUE: Multidetector CT imaging of the abdomen and pelvis was performed following the standard protocol without IV contrast. IV contrast was not administered due to renal insufficiency.  COMPARISON:  Bone scan and renal ultrasound on 02/02/2014 and CT of the chest on 01/30/2014.  FINDINGS: There is a healing fracture noted of the anterior right fifth rib which accounts for focal bone scan activity in this region. No bony lesion is seen in the region of the right sacrum/ sacroiliac joint where there was suggestion of abnormal activity by bone scan. There is a sclerotic bone island in the left sacrum.  No enlarged lymph nodes are identified. Unenhanced appearance of the liver, gallbladder, pancreas, adrenal glands and kidneys are unremarkable. Small 9 mm cortical lesion of the lateral mid left kidney appears of increased density by unenhanced CT and is consistent with hemorrhagic cyst or complex cystic lesion. This was not definitively visualized by renal ultrasound yesterday.  Bowel loops are unremarkable. No abnormal fluid collections are identified. The bladder is unremarkable. No hernias are seen.  IMPRESSION: 1. No focal bony lesions are identified in the spine or bony pelvis. 2. Healing anterior right fifth rib fracture which accounts for focal activity by bone scan. 3. 9 mm cortical lesion of the left kidney which is partially exophytic. This may be a hemorrhagic cyst or complex cystic lesion. At some point, elective MRI of the abdomen would be recommended for further evaluation. If the patient's renal function improves,  MRI with and without gadolinium may be possible in the future.   Electronically Signed   By: Aletta Edouard M.D.   On: 02/03/2014 13:07   Dg Chest 2 View  01/30/2014   CLINICAL DATA:  Chest pain for several weeks.  Shortness of breath.  EXAM: CHEST  2 VIEW  COMPARISON:  01/05/2014  FINDINGS: The cardiac silhouette, mediastinal and hilar contours are within  normal limits and stable. Low lung volumes with vascular crowding and atelectasis. Vague densities in both lungs are most likely areas of atelectasis but. No infiltrate or effusion.  IMPRESSION: Low lung volumes with vascular crowding and streaky areas of atelectasis.   Electronically Signed   By: Kalman Jewels M.D.   On: 01/30/2014 14:50   Dg Chest 2 View  01/05/2014   CLINICAL DATA:  Initial evaluation chest wall pain  EXAM: CHEST  2 VIEW  COMPARISON:  None.  FINDINGS: The heart size and mediastinal contours are within normal limits. Both lungs are clear. The visualized skeletal structures are unremarkable.  IMPRESSION: No active cardiopulmonary disease.   Electronically Signed   By: Skipper Cliche M.D.   On: 01/05/2014 17:44   Ct Angio Chest Pe W/cm &/or Wo Cm  01/30/2014   CLINICAL DATA:  Right-sided chest pain for several weeks. Coughing. Pulmonary embolism.  EXAM: CT ANGIOGRAPHY CHEST WITH CONTRAST  TECHNIQUE: Multidetector CT imaging of the chest was performed using the standard protocol during bolus administration of intravenous contrast. Multiplanar CT image reconstructions and MIPs were obtained to evaluate the vascular anatomy.  CONTRAST:  36m OMNIPAQUE IOHEXOL 350 MG/ML SOLN  COMPARISON:  01/30/2014 chest radiograph.  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 There is erosion of both the anterior and posterior cortex and cortical per made of osteolysis is present. There is retrosternal extension of the manubrial mass, with soft tissue density in the anterior mediastinum measuring 37 mm transverse by 14 mm AP. Despite the anterior sternal manubrium osteolysis, there does not appear to be anterior extraosseous extension of tumor.  The sternal body appears within normal limits. No destructive rib lesions are visible. Thoracic spine appears within normal limits. No supraclavicular adenopathy is visible.  Cardiovascular: Negative for pulmonary embolism. Technically  adequate study. The heart appears normal.  Lungs: Atelectasis.  No evidence of pulmonary metastatic disease.  Central airways: Patent.  Effusions: None.  Lymphadenopathy: No axillary adenopathy. No mediastinal or hilar adenopathy.  Esophagus: Normal.  Upper abdomen: Normal.  Other: Thyroid gland shows tiny low-density lesions that probably represents cysts.  Review of the MIP images confirms the above findings.  IMPRESSION: 1. Negative for pulmonary embolus or acute aortic abnormality. 2. Osteolytic lesion destroying the sternal manubrium with extraosseous extension of tumor into the anterior mediastinum. 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 In this location, the most common lesion is a chondrosarcoma however this does not have typical features of a chondrosarcoma and either metastatic disease, lymphoma or myeloma are more likely.   Electronically Signed   By: GDereck LigasM.D.   On: 01/30/2014 18:43   Nm Bone Scan Whole Body  02/02/2014   CLINICAL DATA:  Evaluate bone lesions seen on chest CT.  EXAM: NUCLEAR MEDICINE WHOLE BODY BONE SCAN  TECHNIQUE: Whole body anterior and posterior images were obtained approximately 3 hours after intravenous injection of radiopharmaceutical.  RADIOPHARMACEUTICALS:  25.0 MCi Technetium-99 MDP  COMPARISON:  Chest CT 01/30/2014  FINDINGS: There are numerous bone lesions involving the skull, ribs, sternum, spine and pelvis. Although bone scan is a 50/50 proposition with multiple myeloma the pattern on the chest CT and and numerous skull lesions would still favor that diagnosis. Metastatic disease is also possible. I think this is unlikely lymphoma.  IMPRESSION: Numerous bone lesions, particularly involving the skull. Favor multiple myeloma.   Electronically Signed   By: Kalman Jewels M.D.   On:  02/02/2014 12:38   US Renal  02/02/2014   CLINICAL DATA:  Acute versus chronic stage 3 kidney disease.  EXAM: RENAL/URINARY TRACT ULTRASOUND COMPLETE  COMPARISON:  None.  FINDINGS: Right Kidney:  Length: 11.7 cm. The kidney demonstrates normal renal cortical thickness but there is slight increased echogenicity suggesting medical renal disease. No hydronephrosis, renal mass or renal calculi.  Left Kidney:  Length: 12.1 cm. Normal renal cortical thickness but slight increased echogenicity suggesting medical renal disease. No hydronephrosis, renal mass or renal calculi.  Bladder:  Normal.  IMPRESSION: Normal size and cortical thickness of both kidneys.  Mild diffuse increased echogenicity suggesting medical renal disease.  No hydronephrosis or renal mass.   Electronically Signed   By: Kalman Jewels M.D.   On: 02/02/2014 12:14   Ct Biopsy  02/03/2014   CLINICAL DATA:  Lytic and expansile lesion involving the sternal manubrium. The patient requires biopsy of this lesion as well as additional bone marrow biopsy for workup of potential multiple myeloma of versus other pathology.  EXAM: 1. CT-GUIDED ASPIRATE AND CORE BONE MARROW BIOPSY OF THE RIGHT ILIAC BONE 2. CT-GUIDED CORE BIOPSY OF STERNAL MASS  ANESTHESIA/SEDATION: Versed 2.0 mg IV, Fentanyl 100 mcg IV  Total Moderate Sedation Time  28 minutes.  PROCEDURE: The procedure risks, benefits, and alternatives were explained to the patient. Questions regarding the procedure were encouraged and answered. The patient understands and consents to the procedure.  The right gluteal region was prepped with Betadine. Sterile gown and sterile gloves were used for the procedure. Local anesthesia was provided with 1% Lidocaine. Prior to the procedure, a time-out was performed.  CT of the bony pelvis was performed in a prone position. Under CT guidance, an 11 gauge OnControl bone cutting needle was advanced from a posterior approach into the right iliac bone. Needle positioning  was confirmed with CT. Initial non heparinized and heparinized aspirate samples were obtained of bone marrow. Core biopsy was performed with the OnControl drill system.  The patient was then rolled into a supine position. Imaging was performed through the sternum. After localizing a site for biopsy, the skin was prepped with Betadine and sterilely draped. Local anesthesia was provided with 1% lidocaine. A separate time-out procedure was performed.  A new 11 gauge OnControl needle was advanced through the anterior cortex of the sternum. Needle position was confirmed by CT. Two separate 11 gauge core biopsy samples were obtained and submitted in formalin. The needle was removed and additional CT performed.  COMPLICATIONS: No significant complications. Potential minimal soft tissue swelling/ hemorrhage deep to the sternum.  FINDINGS: Inspection of initial aspirate did reveal visible particles. An intact core biopsy was obtained.  Expansile and lytic lesion of the left half of the sternal manubrium was localized by CT. There is some underlying soft tissue swelling/thickening deep to the sternum extending into the anterior mediastinal fat. Biopsy of the sternal lesion yielded solid tissue. After biopsy, there may be a minimal amount of increased soft  tissue swelling deep to the sternum.  IMPRESSION: 1. CT guided bone marrow biopsy of right posterior iliac bone with both aspirate and core samples obtained. 2. CT-guided core biopsy performed of the lytic sternal lesion in the left half of the manubrium.   Electronically Signed   By: Aletta Edouard M.D.   On: 02/03/2014 16:05   Ct Biopsy  02/03/2014   CLINICAL DATA:  Lytic and expansile lesion involving the sternal manubrium. The patient requires biopsy of this lesion as well as additional bone marrow biopsy for workup of potential multiple myeloma of versus other pathology.  EXAM: 1. CT-GUIDED ASPIRATE AND CORE BONE MARROW BIOPSY OF THE RIGHT ILIAC BONE 2. CT-GUIDED  CORE BIOPSY OF STERNAL MASS  ANESTHESIA/SEDATION: Versed 2.0 mg IV, Fentanyl 100 mcg IV  Total Moderate Sedation Time  28 minutes.  PROCEDURE: The procedure risks, benefits, and alternatives were explained to the patient. Questions regarding the procedure were encouraged and answered. The patient understands and consents to the procedure.  The right gluteal region was prepped with Betadine. Sterile gown and sterile gloves were used for the procedure. Local anesthesia was provided with 1% Lidocaine. Prior to the procedure, a time-out was performed.  CT of the bony pelvis was performed in a prone position. Under CT guidance, an 11 gauge OnControl bone cutting needle was advanced from a posterior approach into the right iliac bone. Needle positioning was confirmed with CT. Initial non heparinized and heparinized aspirate samples were obtained of bone marrow. Core biopsy was performed with the OnControl drill system.  The patient was then rolled into a supine position. Imaging was performed through the sternum. After localizing a site for biopsy, the skin was prepped with Betadine and sterilely draped. Local anesthesia was provided with 1% lidocaine. A separate time-out procedure was performed.  A new 11 gauge OnControl needle was advanced through the anterior cortex of the sternum. Needle position was confirmed by CT. Two separate 11 gauge core biopsy samples were obtained and submitted in formalin. The needle was removed and additional CT performed.  COMPLICATIONS: No significant complications. Potential minimal soft tissue swelling/ hemorrhage deep to the sternum.  FINDINGS: Inspection of initial aspirate did reveal visible particles. An intact core biopsy was obtained.  Expansile and lytic lesion of the left half of the sternal manubrium was localized by CT. There is some underlying soft tissue swelling/thickening deep to the sternum extending into the anterior mediastinal fat. Biopsy of the sternal lesion yielded  solid tissue. After biopsy, there may be a minimal amount of increased soft tissue swelling deep to the sternum.  IMPRESSION: 1. CT guided bone marrow biopsy of right posterior iliac bone with both aspirate and core samples obtained. 2. CT-guided core biopsy performed of the lytic sternal lesion in the left half of the manubrium.   Electronically Signed   By: Aletta Edouard M.D.   On: 02/03/2014 16:05     PERTINENT LAB RESULTS: CBC:  Recent Labs  02/03/14 0625  WBC 9.5  HGB 11.7*  HCT 34.9*  PLT 335   CMET CMP     Component Value Date/Time   NA 135 02/04/2014 0534   K 4.0 02/04/2014 0534   CL 103 02/04/2014 0534   CO2 26 02/04/2014 0534   GLUCOSE 115* 02/04/2014 0534   BUN 20 02/04/2014 0534   CREATININE 1.75* 02/04/2014 0534   CALCIUM 10.6* 02/04/2014 0534   PROT 7.5 02/04/2014 0534   ALBUMIN 1.9* 02/04/2014 0534   AST 24 02/04/2014 0534  ALT 18 02/04/2014 0534   ALKPHOS 102 02/04/2014 0534   BILITOT 0.4 02/04/2014 0534   GFRNONAA 47* 02/04/2014 0534   GFRAA 55* 02/04/2014 0534    GFR CrCl cannot be calculated (Unknown ideal weight.). No results for input(s): LIPASE, AMYLASE in the last 72 hours. No results for input(s): CKTOTAL, CKMB, CKMBINDEX, TROPONINI in the last 72 hours. Invalid input(s): POCBNP No results for input(s): DDIMER in the last 72 hours. No results for input(s): HGBA1C in the last 72 hours. No results for input(s): CHOL, HDL, LDLCALC, TRIG, CHOLHDL, LDLDIRECT in the last 72 hours. No results for input(s): TSH, T4TOTAL, T3FREE, THYROIDAB in the last 72 hours.  Invalid input(s): FREET3 No results for input(s): VITAMINB12, FOLATE, FERRITIN, TIBC, IRON, RETICCTPCT in the last 72 hours. Coags: No results for input(s): INR in the last 72 hours.  Invalid input(s): PT Microbiology: No results found for this or any previous visit (from the past 240 hour(s)).   BRIEF HOSPITAL COURSE: Lytic Sternal bone lesion seen on CT Chest:suspicion for Multiple  lesion-given constellation of lytic bony lesion, hypercalcemia and CKD. IR consulted, underwent bone marrow bx and bx of a bony lytic lesion on 02/03/14, results still pending, per Dr Abby Potash shows sheets of plasma cells. Await SPEP/UPEP. HIV negative,LDH not significantly elevated.Oncology-Dr Magrinat consulted, has follow up scheduled on 02/07/14 at 2 pm at the cancer center for further eval and treatment. Since continues to have mild worsening of CKD and hypercalcemia, will be given Decadron 20 mg X 2 doses.This MD spoke with Dr Jana Hakim, who apart from [place] and consulting Renal MD had no further recommendations till he saw patient on 02/07/14.   Chronic Renal Failure stage 3: Initially suspected to be prerenal azotemia secondary to NSAID use, and ?Contrast induced.  Unfortuantely no improvement in creatinine with IVF. Renal ultrasound negative for hydronephroses. No proteinuria on UA. Given clinical findings likely secondary to Myeloma. Spoke with Dr Elmarie Shiley over the phone, who reviewed labs, history etc, and suggested that mild increase in creatinine to 1.75 on day of discharge was clinically insignificant. He had no further recommendations at this time, apart from [place], and close follow up with Oncology. Patient will need a renal panel checked on 1/8 when he follows with Oncology  Hypercalcemia:suspect from Myeloma-suspect this will get better with treatment of underlying malignancy.Will need lytes to be checked on 02/07/14.  TODAY-DAY OF DISCHARGE:  Subjective:   Charles [name] today has no headache,no chest abdominal pain,no new weakness tingling or numbness, feels much better wants to go home today.   Objective:   Blood pressure 136/101, pulse 80, temperature 98.6 F (37 C), temperature source Oral, resp. rate 18, SpO2 98 %.  Intake/Output Summary (Last 24 hours) at 02/04/14 0931 Last data filed at 02/04/14 0340  Gross per 24 hour  Intake 827.08 ml  Output    150 ml    Net 677.08 ml   There were no vitals filed for this visit.  Exam Awake Alert, Oriented *3, No new F.N deficits, Normal affect [place].AT,PERRAL Supple Neck,No JVD, No cervical lymphadenopathy appriciated.  Symmetrical Chest wall movement, Good air movement bilaterally, CTAB RRR,No Gallops,Rubs or new Murmurs, No Parasternal Heave +ve B.Sounds, Abd Soft, Non tender, No organomegaly appriciated, No rebound -guarding or rigidity. No Cyanosis, Clubbing or edema, No new Rash or bruise  DISCHARGE CONDITION: Stable  DISPOSITION: Home  DISCHARGE INSTRUCTIONS:    Activity:  As tolerated   Diet recommendation: Regular Diet  Discharge Instructions    Call  MD for:  persistant nausea and vomiting    Complete by:  As directed      Call MD for:  severe uncontrolled pain    Complete by:  As directed      Diet general    Complete by:  As directed      Discharge instructions    Complete by:  As directed   Please keep yourself well hydrated-2-3 quarts of liquid daily  NO NSAIDS'S!!     Increase activity slowly    Complete by:  As directed           Follow-up Information    Follow up with [name], MD On 02/07/2014.   Specialty:  Oncology   Why:  APPOINTMENT AT 2 PM [place] information:   [place] [id number] [phone]         Total Time spent on discharge equals 45 minutes.  SignedOren Binet 02/04/2014 9:31 AM

## 2014-02-04 NOTE — Discharge Instructions (Signed)
No NSAID (NO IBUPROFEN, ALEVEL, MOTRIN, ADVIL ETC)    Mieloma mltiple (Multiple Myeloma) El mieloma mltiple es el cncer ms frecuente de los [place]. La causa es una multiplicacin descontrolada de un tipo de glbulos blancos que se encuentran en la mdula sea. Estos glbulos blancos se denominan clulas plasmticas. En el mieloma mltiple la mdula sea produce demasiadas clulas plasmticas. Muy pronto, esta cantidad excesiva de clulas comienza a Engineer, maintenance (IT) de la mdula que necesitan las otras clulas. Entonces disminuye la cantidad de glbulos rojos, glbulos blancos o plaquetas. Cuando una persona no tiene la cantidad suficiente de glbulos rojos, est anmica. No hay una cantidad suficiente de glbulos rojos que transporten el oxgeno por el organismo. No hay suficiente cantidad de glbulos blancos que puedan luchar contra las enfermedades. Esto hace que la persona con mieloma mltiple no se sienta bien. Tambin hay dolor en los huesos en gran parte del cuerpo. SNTOMAS  La anemia ocasiona fatiga (cansancio) y debilidad.  Es frecuente Conservation officer, historic buildings de espalda. Proviene de las fracturas (rotura) ocasionadas por lesiones en los huesos de la espalda.  La falta de glbulos blancos favorece las infecciones.  Las hemorragias (sangrado) son un problema frecuente cuando no hay suficientes plaquetas. Las plaquetas intervienen en la coagulacin de la [place]. Puede ocurrir que Secondary school teacher. Generalmente se presenta como sangrado de la nariz o las encas.  Las fracturas (rotura) de los huesos ocurren en cualquier parte del cuerpo. La espalda y las costillas son las reas que se fracturan con ms frecuencia. DIAGNSTICO Este tumor generalmente se detecta a partir de [employer] de [place]. Generalmente, al tomar [employer] de mdula sea puede hacerse el diagnstico (determinar cul es el problema). Esta prueba se realiza tomando una pequea [place] de hueso con una aguja  chica. Generalmente se toma del hueso del esternn (hueso del trax). Estas muestras de tejido se envan a un patlogo (especialista en la observacin de tejidos en el microscopio). Despus de observar la [place], el patlogo puede establecer un diagnstico del problema. Las radiografas tambin [employer] cambios en [place]. TRATAMIENTO  En ocasiones se utilizan medicamentos anticancergenos para tratar Radio producer. El profesional que lo asiste lo comentar con usted.Lavera Guise se prescribirn medicamentos para evitar el dolor en los huesos.  No hay cura para el mieloma mltiple. Los [employer] en el estilo de vida pueden agregar aos con calidad de vida. INSTRUCCIONES PARA EL CUIDADO DOMICILIARIO Generalmente no existe un tratamiento especfico para el mieloma mltiple cuando se detecta precozmente. La mayor parte del tratamiento consiste en ajustes de la dieta y las actividades diarias. Entre ellos se incluyen:  El dietista o el profesional que lo asiste podrn ayudarlo con las dudas relacionadas con la dieta.  Tome hierro y vitaminas segn se lo ha Air cabin crew que lo asiste.  Consuma una dieta normal y bien balanceada.  Permanezca activo, pero siga las restricciones sugeridas por Writer. Public librarian pesos (ms de 4,5 kilogramos) y las actividades que le aumenten Conservation officer, historic buildings.  Beba gran cantidad de agua.  El uso de aparatos ortopdicos y de bastn lo ayudarn a Health and safety inspector. SOLICITE ATENCIN MDICA DE INMEDIATO SI:  Presenta dolor en los huesos intenso e incontrolable.  Usted o su familia han notado que est confundido, tiene problemas en la toma de decisiones o no puede Chief Strategy Officer.  Nota un aumento en la necesidad de Garment/textile technologist o estreimiento.  Advierte que no puede contener la [place] o  la materia fecal.  Siente adormecimiento o falta de control de las extremidades (brazos / manos o piernas / pies). Document Released:  10/27/2004 Document Revised: 04/11/2011 [facility] Patient Information 2015 [place]. This information is not intended to replace advice given to you by your health care provider. Make sure you discuss any questions you have with your health care provider.

## 2014-02-04 NOTE — Progress Notes (Signed)
CARE MANAGEMENT NOTE 02/04/2014  Patient:  Charles [name], Charles [name]   Account Number:  [id number]  Date Initiated:  02/04/2014  Documentation initiated by:  [facility]  Subjective/Objective Assessment:   lytic bone lesions with mets     Action/Plan:   lives at home with wife   Anticipated DC Date:  02/04/2014   Anticipated DC Plan:  [place]  CM consult      Choice offered to / List presented to:             Status of service:  Completed, signed off Medicare Important Message given?   (If response is "NO", the following Medicare IM given date fields will be blank) Date Medicare IM given:   Medicare IM given by:   Date Additional Medicare IM given:   Additional Medicare IM given by:    Discharge Disposition:  HOME/SELF CARE  Per UR Regulation:  Reviewed for med. necessity/level of care/duration of stay  If discussed at [place] of Stay Meetings, dates discussed:   02/04/2014    Comments:  02/04/2014 1030 NCM spoke to pt and he does work. Provided pt with [facility] brochure for follow up appt on 02/11/2014 at 12 pm. Provided info to pt on where to apply for Medicaid. Meds reviewed and able to purchase meds. Jonnie Finner RN CCM Case Mgmt phone [phone]

## 2014-02-04 NOTE — Progress Notes (Signed)
Am concerned that creatinine continues to rise despite increased hydration overnight. Would consider nephrology input. I will contact pathology this AM and see if we can obtain a preliminary report.

## 2014-02-05 LAB — KAPPA/LAMBDA LIGHT CHAINS
Kappa free light chain: 10.5 mg/dL — ABNORMAL HIGH (ref 0.33–1.94)
Kappa, lambda light chain ratio: 6.03 — ABNORMAL HIGH (ref 0.26–1.65)
LAMDA FREE LIGHT CHAINS: 1.74 mg/dL (ref 0.57–2.63)

## 2014-02-06 LAB — BETA 2 MICROGLOBULIN, SERUM: Beta-2 Microglobulin: 7.36 mg/L — ABNORMAL HIGH (ref <=2.51)

## 2014-02-07 ENCOUNTER — Encounter: Payer: Self-pay | Admitting: Oncology

## 2014-02-07 ENCOUNTER — Ambulatory Visit (HOSPITAL_BASED_OUTPATIENT_CLINIC_OR_DEPARTMENT_OTHER): Payer: Self-pay | Admitting: Oncology

## 2014-02-07 ENCOUNTER — Other Ambulatory Visit (HOSPITAL_BASED_OUTPATIENT_CLINIC_OR_DEPARTMENT_OTHER): Payer: Self-pay

## 2014-02-07 ENCOUNTER — Telehealth: Payer: Self-pay | Admitting: Oncology

## 2014-02-07 ENCOUNTER — Ambulatory Visit: Payer: Self-pay

## 2014-02-07 VITALS — BP 135/68 | HR 88 | Temp 98.4°F | Resp 18 | Ht 62.0 in | Wt 156.6 lb

## 2014-02-07 DIAGNOSIS — M898X9 Other specified disorders of bone, unspecified site: Secondary | ICD-10-CM

## 2014-02-07 DIAGNOSIS — Z23 Encounter for immunization: Secondary | ICD-10-CM

## 2014-02-07 DIAGNOSIS — C9000 Multiple myeloma not having achieved remission: Secondary | ICD-10-CM

## 2014-02-07 DIAGNOSIS — D630 Anemia in neoplastic disease: Secondary | ICD-10-CM

## 2014-02-07 DIAGNOSIS — M899 Disorder of bone, unspecified: Secondary | ICD-10-CM

## 2014-02-07 DIAGNOSIS — N183 Chronic kidney disease, stage 3 unspecified: Secondary | ICD-10-CM

## 2014-02-07 DIAGNOSIS — M549 Dorsalgia, unspecified: Secondary | ICD-10-CM

## 2014-02-07 LAB — CBC & DIFF AND RETIC
BASO%: 0.2 % (ref 0.0–2.0)
BASOS ABS: 0 10*3/uL (ref 0.0–0.1)
EOS%: 1.2 % (ref 0.0–7.0)
Eosinophils Absolute: 0.1 10*3/uL (ref 0.0–0.5)
HCT: 34.8 % — ABNORMAL LOW (ref 38.4–49.9)
HEMOGLOBIN: 11.2 g/dL — AB (ref 13.0–17.1)
Immature Retic Fract: 8.9 % (ref 3.00–10.60)
LYMPH%: 34.6 % (ref 14.0–49.0)
MCH: 30.6 pg (ref 27.2–33.4)
MCHC: 32.2 g/dL (ref 32.0–36.0)
MCV: 95.1 fL (ref 79.3–98.0)
MONO#: 0.9 10*3/uL (ref 0.1–0.9)
MONO%: 8 % (ref 0.0–14.0)
NEUT#: 6.5 10*3/uL (ref 1.5–6.5)
NEUT%: 56 % (ref 39.0–75.0)
Platelets: 374 10*3/uL (ref 140–400)
RBC: 3.66 10*6/uL — ABNORMAL LOW (ref 4.20–5.82)
RDW: 13.1 % (ref 11.0–14.6)
RETIC %: 1.8 % (ref 0.80–1.80)
RETIC CT ABS: 65.88 10*3/uL (ref 34.80–93.90)
WBC: 11.6 10*3/uL — ABNORMAL HIGH (ref 4.0–10.3)
lymph#: 4 10*3/uL — ABNORMAL HIGH (ref 0.9–3.3)

## 2014-02-07 LAB — COMPREHENSIVE METABOLIC PANEL (CC13)
ALK PHOS: 110 U/L (ref 40–150)
ALT: 17 U/L (ref 0–55)
ANION GAP: 5 meq/L (ref 3–11)
AST: 19 U/L (ref 5–34)
Albumin: 2 g/dL — ABNORMAL LOW (ref 3.5–5.0)
BUN: 27.2 mg/dL — ABNORMAL HIGH (ref 7.0–26.0)
CALCIUM: 10.2 mg/dL (ref 8.4–10.4)
CO2: 30 meq/L — AB (ref 22–29)
Chloride: 102 mEq/L (ref 98–109)
Creatinine: 1.3 mg/dL (ref 0.7–1.3)
EGFR: 68 mL/min/{1.73_m2} — ABNORMAL LOW (ref >90)
GLUCOSE: 103 mg/dL (ref 70–140)
POTASSIUM: 3.9 meq/L (ref 3.5–5.1)
SODIUM: 138 meq/L (ref 136–145)
TOTAL PROTEIN: 7.9 g/dL (ref 6.4–8.3)

## 2014-02-07 MED ORDER — INFLUENZA VAC SPLIT QUAD 0.5 ML IM SUSY
0.5000 mL | PREFILLED_SYRINGE | Freq: Once | INTRAMUSCULAR | Status: AC
  Administered 2014-02-07: 0.5 mL via INTRAMUSCULAR
  Filled 2014-02-07: qty 0.5

## 2014-02-07 MED ORDER — PNEUMOCOCCAL 13-VAL CONJ VACC IM SUSP
0.5000 mL | INTRAMUSCULAR | Status: AC
  Administered 2014-02-07: 0.5 mL via INTRAMUSCULAR
  Filled 2014-02-07: qty 0.5

## 2014-02-07 MED ORDER — DEXAMETHASONE 4 MG PO TABS: ORAL_TABLET | ORAL | Status: DC

## 2014-02-07 MED ORDER — ACYCLOVIR 400 MG PO TABS: 400.0000 mg | ORAL_TABLET | Freq: Every day | ORAL | Status: DC

## 2014-02-07 NOTE — Telephone Encounter (Signed)
, °

## 2014-02-07 NOTE — Progress Notes (Signed)
Checked in new pt with no insurance.  Pt is not a [place] citizen so he can't apply for Medicaid so I gave him a financial application to apply for assistance thru the hospital.  Pt has Raquel's card for any questions or concerns.

## 2014-02-08 ENCOUNTER — Encounter: Payer: Self-pay | Admitting: Oncology

## 2014-02-08 DIAGNOSIS — D630 Anemia in neoplastic disease: Secondary | ICD-10-CM | POA: Insufficient documentation

## 2014-02-08 DIAGNOSIS — N186 End stage renal disease: Secondary | ICD-10-CM | POA: Insufficient documentation

## 2014-02-08 NOTE — Progress Notes (Signed)
Charles [name]  [phone] Fax:(336) [phone]     ID: Charles [name] DOB: 07-Jan-1976  MR#: [id number]  IOE#:[id number]  Patient Care Team: Charles Garter, MD as PCP - General (Internal Medicine) PCP: Charles Chessman, MD GYN: SU:  OTHER MD:  CHIEF COMPLAINT: multiple myeloma  CURRENT TREATMENT: bortezomib, cyclophosphamide, dexamethasone   HISTORY  OF MULTIPLE MYELOMA: 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 The pain was worse with coughing. Exam was unremarkable, and he was treated with Mobic. No labs were obtained. On 01/30/2014 he presented to the emergency room at [facility] with similar complaints and also reported a 10 pound weight loss over the past month. A chest x-ray was significant only for mild atelectasis, but ACT NGO of the chest 01/30/2014, while it showed no pulmonary emboli, showed an osteolytic lesion destroying the manubrium of the sternum. There was extra osseous extension into the anterior mediastinum.  The patient was admitted and found to have a creatinine of 1.64 with a GFR of 51. Bilateral renal ultrasound showed no hydronephrosis or masses, and normal size and cortical thickness of both kidneys, though there was mild diffuse echogenicity. Urine total protein was 145 mg/dL and immunofixation showed a monoclonal IgG kappa protein as well as monoclonal free kappa light chains. On 01/31/2014 serum protein electrophoresis showed an M spike of 2.22 g/dL with a total protein of 8.3 and albumin 3.4. Kappa lambda light chains from the serum obtained 02/04/2014 showed 10.50 mg/dL on the, 1.74 in the lambda, with an elevated ratio at 6.03. Beta-2 microglobulin was 7.36. LDH was normal at 184 and HIV antibody was nonreactive.  Biopsy of the manubrial mass 02/03/2014 showed extensive infiltration of  the bone by sheets of atypical plasma cells, positive for CD138, kappa restricted (SZA 16-23). Right iliac bone marrow biopsy Charles [name] 05/21/2014 (FZB 16-1) showed an average of 12% plasma cells with numerous aggregates and sheets of atypical plasma cells, which were kappa restricted. Cytogenetic analysis is pending  The patient's subsequent history is as detailed below  INTERVAL HISTORY: [name] in the hematology clinic 02/07/2013 accompanied by his wife Charles [name] and an interpreter. He was discharged from the hospital 02/05/2014. He was started on dexamethasone at 20 mg daily for 2 days on 02/04/2014. Reports no [name] effects from the medication. He is taking Tylenol for his back pain, although he has oxycodone prescribed at discharge. He has not started those.  REVIEW OF SYSTEMS: He denies unusual headaches, visual changes, nausea, vomiting, dizziness, or gait imbalance. He continues to have some chest wall pain and back pain. He is no longer coughing. He denies pleurisy, hemoptysis, or shortness of breath. There has not been any change in bowel or bladder habits. There has been no rash bleeding or fever. A detailed review of systems today was otherwise noncontributory  PAST MEDICAL HISTORY: Past Medical History  Diagnosis Date  . IgG myeloma   . CKD (chronic kidney disease), stage III   . Hypercholesterolemia   . Lytic bone lesions on xray     PAST SURGICAL HISTORY: No past surgical history on file.  FAMILY HISTORY No family history on file. The patient's parents are still living, in their late 76's. The patient has 2 brothers, 3 sisters. There is noc cancer in the fmaily to his knowledge.  SOCIAL HISTORY:  Originally from [facility] [place],  moved here 19 years ago. He works in Nurse, adult. Married (wife's name is Charles [name]), lives in [place]. He has 5 children ages 89, 67, 64, 13 and 37 months Charles [name], Charles [name], Charles [name] and Charles [name]) in good  health. Best number to call him is [phone]    ADVANCED DIRECTIVES: not  In place   HEALTH MAINTENANCE: History  Substance Use Topics  . Smoking status: Never Smoker   . Smokeless tobacco: Never Used  . Alcohol Use: No     Colonoscopy:  PSA:  Bone density:  Lipid panel:  No Known Allergies  Current Outpatient Prescriptions  Medication Sig Dispense Refill  . acetaminophen (TYLENOL) 325 MG tablet Take 2 tablets (650 mg total) by mouth every 6 (six) hours as needed for mild pain (or Fever >/= 101).    Marland Kitchen acyclovir (ZOVIRAX) 400 MG tablet Take 1 tablet (400 mg total) by mouth 5 (five) times daily. 60 tablet 6  . dexamethasone (DECADRON) 4 MG tablet Tome 5 pastillas al desayuno cada martes y miercoles (take 5 tablets w breakfast every Tuesday and wes) 50 tablet 12  . oxyCODONE (OXY IR/ROXICODONE) 5 MG immediate release tablet Take 1 tablet (5 mg total) by mouth every 6 (six) hours as needed for moderate pain. 20 tablet 0   No current facility-administered medications for this visit.    OBJECTIVE: Charles [name] man who appears stated age 39 Vitals:   02/07/14 1442  BP: 135/68  Pulse: 88  Temp: 98.4 F (36.9 C)  Resp: 18     Body mass index is 28.64 kg/(m^2).    ECOG FS:1 - Symptomatic but completely ambulatory  Ocular: Sclerae unicteric, pupils equal, round and reactive to light Ear-nose-throat: Oropharynx clear, dentition fair Lymphatic: No cervical or supraclavicular adenopathy Lungs no rales or rhonchi, fair excursion bilaterally Heart regular rate and rhythm, no murmur appreciated Abd soft, nontender, positive bowel sounds MSK no focal spinal tenderness to moderate palpation, no joint edema Neuro: non-focal, well-oriented, appropriate affect  LAB RESULTS:  CMP     Component Value Date/Time   NA 138 02/07/2014 1415   NA 135 02/04/2014 0534   K 3.9 02/07/2014 1415   K 4.0 02/04/2014 0534   CL 103 02/04/2014 0534   CO2 30* 02/07/2014 1415   CO2 26  02/04/2014 0534   GLUCOSE 103 02/07/2014 1415   GLUCOSE 115* 02/04/2014 0534   BUN 27.2* 02/07/2014 1415   BUN 20 02/04/2014 0534   CREATININE 1.3 02/07/2014 1415   CREATININE 1.75* 02/04/2014 0534   CALCIUM 10.2 02/07/2014 1415   CALCIUM 10.6* 02/04/2014 0534   PROT 7.9 02/07/2014 1415   PROT 7.5 02/04/2014 0534   ALBUMIN 2.0* 02/07/2014 1415   ALBUMIN 1.9* 02/04/2014 0534   AST 19 02/07/2014 1415   AST 24 02/04/2014 0534   ALT 17 02/07/2014 1415   ALT 18 02/04/2014 0534   ALKPHOS 110 02/07/2014 1415   ALKPHOS 102 02/04/2014 0534   BILITOT <0.20 02/07/2014 1415   BILITOT 0.4 02/04/2014 0534   GFRNONAA 47* 02/04/2014 0534   GFRAA 55* 02/04/2014 0534    INo results found for: SPEP, UPEP  Lab Results  Component Value Date   WBC 11.6* 02/07/2014   NEUTROABS 6.5 02/07/2014   HGB 11.2* 02/07/2014   HCT 34.8* 02/07/2014   MCV 95.1 02/07/2014   PLT 374 02/07/2014      Chemistry      Component Value Date/Time   NA 138 02/07/2014 1415   NA 135 02/04/2014 0534  K 3.9 02/07/2014 1415   K 4.0 02/04/2014 0534   CL 103 02/04/2014 0534   CO2 30* 02/07/2014 1415   CO2 26 02/04/2014 0534   BUN 27.2* 02/07/2014 1415   BUN 20 02/04/2014 0534   CREATININE 1.3 02/07/2014 1415   CREATININE 1.75* 02/04/2014 0534      Component Value Date/Time   CALCIUM 10.2 02/07/2014 1415   CALCIUM 10.6* 02/04/2014 0534   ALKPHOS 110 02/07/2014 1415   ALKPHOS 102 02/04/2014 0534   AST 19 02/07/2014 1415   AST 24 02/04/2014 0534   ALT 17 02/07/2014 1415   ALT 18 02/04/2014 0534   BILITOT <0.20 02/07/2014 1415   BILITOT 0.4 02/04/2014 0534       No results found for: LABCA2  No components found for: LABCA125  No results for input(s): INR in the last 168 hours.  Urinalysis    Component Value Date/Time   COLORURINE YELLOW 01/31/2014 1050   APPEARANCEUR CLOUDY* 01/31/2014 1050   LABSPEC 1.019 01/31/2014 1050   PHURINE 6.0 01/31/2014 1050   GLUCOSEU NEGATIVE 01/31/2014 1050    HGBUR LARGE* 01/31/2014 1050   BILIRUBINUR NEGATIVE 01/31/2014 1050   KETONESUR NEGATIVE 01/31/2014 1050   PROTEINUR 100* 01/31/2014 1050   UROBILINOGEN 0.2 01/31/2014 1050   NITRITE NEGATIVE 01/31/2014 1050   LEUKOCYTESUR NEGATIVE 01/31/2014 1050    STUDIES: Ct Abdomen Pelvis Wo Contrast  02/03/2014   CLINICAL DATA:  Sternal lesion and other bone lesions by bone scan. Imaging is performed prior to planned bone marrow biopsy and potential biopsy of either the sternal lesion or other bony lesions.  EXAM: CT ABDOMEN AND PELVIS WITHOUT CONTRAST  TECHNIQUE: Multidetector CT imaging of the abdomen and pelvis was performed following the standard protocol without IV contrast. IV contrast was not administered due to renal insufficiency.  COMPARISON:  Bone scan and renal ultrasound on 02/02/2014 and CT of the chest on 01/30/2014.  FINDINGS: There is a healing fracture noted of the anterior right fifth rib which accounts for focal bone scan activity in this region. No bony lesion is seen in the region of the right sacrum/ sacroiliac joint where there was suggestion of abnormal activity by bone scan. There is a sclerotic bone island in the left sacrum.  No enlarged lymph nodes are identified. Unenhanced appearance of the liver, gallbladder, pancreas, adrenal glands and kidneys are unremarkable. Small 9 mm cortical lesion of the lateral mid left kidney appears of increased density by unenhanced CT and is consistent with hemorrhagic cyst or complex cystic lesion. This was not definitively visualized by renal ultrasound yesterday.  Bowel loops are unremarkable. No abnormal fluid collections are identified. The bladder is unremarkable. No hernias are seen.  IMPRESSION: 1. No focal bony lesions are identified in the spine or bony pelvis. 2. Healing anterior right fifth rib fracture which accounts for focal activity by bone scan. 3. 9 mm cortical lesion of the left kidney which is partially exophytic. This may be a  hemorrhagic cyst or complex cystic lesion. At some point, elective MRI of the abdomen would be recommended for further evaluation. If the patient's renal function improves, MRI with and without gadolinium may be possible in the future.   Electronically Signed   By: Aletta Edouard M.D.   On: 02/03/2014 13:07   Dg Chest 2 View  01/30/2014   CLINICAL DATA:  Chest pain for several weeks.  Shortness of breath.  EXAM: CHEST  2 VIEW  COMPARISON:  01/05/2014  FINDINGS: The cardiac silhouette,  mediastinal and hilar contours are within normal limits and stable. Low lung volumes with vascular crowding and atelectasis. Vague densities in both lungs are most likely areas of atelectasis but. No infiltrate or effusion.  IMPRESSION: Low lung volumes with vascular crowding and streaky areas of atelectasis.   Electronically Signed   By: Kalman Jewels M.D.   On: 01/30/2014 14:50   Ct Angio Chest Pe W/cm &/or Wo Cm  01/30/2014   CLINICAL DATA:  Right-sided chest pain for several weeks. Coughing. Pulmonary embolism.  EXAM: CT ANGIOGRAPHY CHEST WITH CONTRAST  TECHNIQUE: Multidetector CT imaging of the chest was performed using the standard protocol during bolus administration of intravenous contrast. Multiplanar CT image reconstructions and MIPs were obtained to evaluate the vascular anatomy.  CONTRAST:  50m OMNIPAQUE IOHEXOL 350 MG/ML SOLN  COMPARISON:  01/30/2014 chest radiograph.  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 There is erosion of both the anterior and posterior cortex and cortical per made of osteolysis is present. There is retrosternal extension of the manubrial mass, with soft tissue density in the anterior mediastinum measuring 37 mm transverse by 14 mm AP. Despite the anterior sternal manubrium osteolysis, there does not appear to be anterior extraosseous extension of tumor.  The sternal body appears within normal limits. No destructive rib lesions are visible. Thoracic spine  appears within normal limits. No supraclavicular adenopathy is visible.  Cardiovascular: Negative for pulmonary embolism. Technically adequate study. The heart appears normal.  Lungs: Atelectasis.  No evidence of pulmonary metastatic disease.  Central airways: Patent.  Effusions: None.  Lymphadenopathy: No axillary adenopathy. No mediastinal or hilar adenopathy.  Esophagus: Normal.  Upper abdomen: Normal.  Other: Thyroid gland shows tiny low-density lesions that probably represents cysts.  Review of the MIP images confirms the above findings.  IMPRESSION: 1. Negative for pulmonary embolus or acute aortic abnormality. 2. Osteolytic lesion destroying the sternal manubrium with extraosseous extension of tumor into the anterior mediastinum. 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 In this location, the most common lesion is a chondrosarcoma however this does not have typical features of a chondrosarcoma and either metastatic disease, lymphoma or myeloma are more likely.   Electronically Signed   By: GDereck LigasM.D.   On: 01/30/2014 18:43   Nm Bone Scan Whole Body  02/02/2014   CLINICAL DATA:  Evaluate bone lesions seen on chest CT.  EXAM: NUCLEAR MEDICINE WHOLE BODY BONE SCAN  TECHNIQUE: Whole body anterior and posterior images were obtained approximately 3 hours after intravenous injection of radiopharmaceutical.  RADIOPHARMACEUTICALS:  25.0 MCi Technetium-99 MDP  COMPARISON:  Chest CT 01/30/2014  FINDINGS: There are numerous bone lesions involving the skull, ribs, sternum, spine and pelvis. Although bone scan is a 50/50 proposition with multiple myeloma the pattern on the chest CT and and numerous skull lesions would still favor that diagnosis. Metastatic disease is also possible. I think this is unlikely lymphoma.  IMPRESSION: Numerous  bone lesions, particularly involving the skull. Favor multiple myeloma.   Electronically Signed   By: MKalman JewelsM.D.   On: 02/02/2014 12:38   [place]  02/02/2014   CLINICAL DATA:  Acute versus chronic stage 3 kidney disease.  EXAM: RENAL/URINARY TRACT ULTRASOUND COMPLETE  COMPARISON:  None.  FINDINGS: Right Kidney:  Length: 11.7 cm. The kidney demonstrates normal renal cortical thickness but there  is slight increased echogenicity suggesting medical renal disease. No hydronephrosis, renal mass or renal calculi.  Left Kidney:  Length: 12.1 cm. Normal renal cortical thickness but slight increased echogenicity suggesting medical renal disease. No hydronephrosis, renal mass or renal calculi.  Bladder:  Normal.  IMPRESSION: Normal size and cortical thickness of both kidneys.  Mild diffuse increased echogenicity suggesting medical renal disease.  No hydronephrosis or renal mass.   Electronically Signed   By: Kalman Jewels M.D.   On: 02/02/2014 12:14   Ct Biopsy  02/03/2014   CLINICAL DATA:  Lytic and expansile lesion involving the sternal manubrium. The patient requires biopsy of this lesion as well as additional bone marrow biopsy for workup of potential multiple myeloma of versus other pathology.  EXAM: 1. CT-GUIDED ASPIRATE AND CORE BONE MARROW BIOPSY OF THE RIGHT ILIAC BONE 2. CT-GUIDED CORE BIOPSY OF STERNAL MASS  ANESTHESIA/SEDATION: Versed 2.0 mg IV, Fentanyl 100 mcg IV  Total Moderate Sedation Time  28 minutes.  PROCEDURE: The procedure risks, benefits, and alternatives were explained to the patient. Questions regarding the procedure were encouraged and answered. The patient understands and consents to the procedure.  The right gluteal region was prepped with Betadine. Sterile gown and sterile gloves were used for the procedure. Local anesthesia was provided with 1% Lidocaine. Prior to the procedure, a time-out was performed.  CT of the bony pelvis was performed in a prone position. Under CT guidance,  an 11 gauge OnControl bone cutting needle was advanced from a posterior approach into the right iliac bone. Needle positioning was confirmed with CT. Initial non heparinized and heparinized aspirate samples were obtained of bone marrow. Core biopsy was performed with the OnControl drill system.  The patient was then rolled into a supine position. Imaging was performed through the sternum. After localizing a site for biopsy, the skin was prepped with Betadine and sterilely draped. Local anesthesia was provided with 1% lidocaine. A separate time-out procedure was performed.  A new 11 gauge OnControl needle was advanced through the anterior cortex of the sternum. Needle position was confirmed by CT. Two separate 11 gauge core biopsy samples were obtained and submitted in formalin. The needle was removed and additional CT performed.  COMPLICATIONS: No significant complications. Potential minimal soft tissue swelling/ hemorrhage deep to the sternum.  FINDINGS: Inspection of initial aspirate did reveal visible particles. An intact core biopsy was obtained.  Expansile and lytic lesion of the left half of the sternal manubrium was localized by CT. There is some underlying soft tissue swelling/thickening deep to the sternum extending into the anterior mediastinal fat. Biopsy of the sternal lesion yielded solid tissue. After biopsy, there may be a minimal amount of increased soft tissue swelling deep to the sternum.  IMPRESSION: 1. CT guided bone marrow biopsy of right posterior iliac bone with both aspirate and core samples obtained. 2. CT-guided core biopsy performed of the lytic sternal lesion in the left half of the manubrium.   Electronically Signed   By: Aletta Edouard M.D.   On: 02/03/2014 16:05   Ct Biopsy  02/03/2014   CLINICAL DATA:  Lytic and expansile lesion involving the sternal manubrium. The patient requires biopsy of this lesion as well as additional bone marrow biopsy for workup of potential multiple  myeloma of versus other pathology.  EXAM: 1. CT-GUIDED ASPIRATE AND CORE BONE MARROW BIOPSY OF THE RIGHT ILIAC BONE 2. CT-GUIDED CORE BIOPSY OF STERNAL MASS  ANESTHESIA/SEDATION: Versed 2.0 mg IV, Fentanyl 100 mcg IV  Total Moderate Sedation Time  28 minutes.  PROCEDURE: The procedure risks, benefits, and alternatives were explained to the patient. Questions regarding the procedure were encouraged and answered. The patient understands and consents to the procedure.  The right gluteal region was prepped with Betadine. Sterile gown and sterile gloves were used for the procedure. Local anesthesia was provided with 1% Lidocaine. Prior to the procedure, a time-out was performed.  CT of the bony pelvis was performed in a prone position. Under CT guidance, an 11 gauge OnControl bone cutting needle was advanced from a posterior approach into the right iliac bone. Needle positioning was confirmed with CT. Initial non heparinized and heparinized aspirate samples were obtained of bone marrow. Core biopsy was performed with the OnControl drill system.  The patient was then rolled into a supine position. Imaging was performed through the sternum. After localizing a site for biopsy, the skin was prepped with Betadine and sterilely draped. Local anesthesia was provided with 1% lidocaine. A separate time-out procedure was performed.  A new 11 gauge OnControl needle was advanced through the anterior cortex of the sternum. Needle position was confirmed by CT. Two separate 11 gauge core biopsy samples were obtained and submitted in formalin. The needle was removed and additional CT performed.  COMPLICATIONS: No significant complications. Potential minimal soft tissue swelling/ hemorrhage deep to the sternum.  FINDINGS: Inspection of initial aspirate did reveal visible particles. An intact core biopsy was obtained.  Expansile and lytic lesion of the left half of the sternal manubrium was localized by CT. There is some underlying soft  tissue swelling/thickening deep to the sternum extending into the anterior mediastinal fat. Biopsy of the sternal lesion yielded solid tissue. After biopsy, there may be a minimal amount of increased soft tissue swelling deep to the sternum.  IMPRESSION: 1. CT guided bone marrow biopsy of right posterior iliac bone with both aspirate and core samples obtained. 2. CT-guided core biopsy performed of the lytic sternal lesion in the left half of the manubrium.   Electronically Signed   By: Aletta Edouard M.D.   On: 02/03/2014 16:05    ASSESSMENT: 39 y.o. 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;        I: CyBorD started January 2016  (a) dexamethasone 20 mg/d every TU/WED started 02/04/2014  (b) bortezomib sQ days 1,4,8,11 of every 21 day cycle started 02/10/2014  (c) cyclophosphamide 300 mg/M2 IV weekly started 02/13/2014  (2) chronic kidney disease stage III  (3) hypercalcemia: zolendronate started 02/10/2014-- repeat every 12 weeks  (4) ID prophylaxis:  (a) influenza and Pneumovax [PV13] vaccines 02/07/2014  (b) acyclovir started 02/07/2014  (c) HIV negative 01/31/2014   PLAN: I spent approximately one hour today with the patient, his wife and the interpreter, going over the results of the workup at his recent hospitalization. This confirms multiple myeloma. The patient understands with her current knowledge base, myeloma is not curable but  it is very treatable. We then discussed treatment options and we are going to start with CyBorD. Advantages of this  regimen are that it will not compromise the possibility of transplant,  although I do not know that we can get this patient to transplant for financial reasons (I have contacted [facility] with this question). A second advantage is that we do not have to anticoagulate the patient. Finally by giving the cyclophosphamide intravenously instead of orally we greatly simplify compliance, monitoring and financial concerns.  We went ahead and immunized Charles [name] against the flu and Pneumovax today. When he sees [place] again February 14, we will start him on Prilosec 20 mg at bedtime and consider an antifungal mouthwash. We will review his written schedule and make sure he understands it. He also met with our financial Department and he has been given a financial application to receive assistance through the hospital. This also needs to be followed up on.  The patient has a good understanding of the overall plan. She agrees with it. She knows the goal of treatment in her case is cure. She will call with any problems that may develop before her next visit here.  [name], MD   02/08/2014 10:29 AM Medical Oncology and Hematology [facility] [address] [place], [place] [address] Tel. [phone]    Fax. [phone]

## 2014-02-10 ENCOUNTER — Other Ambulatory Visit: Payer: Self-pay | Admitting: Oncology

## 2014-02-10 ENCOUNTER — Ambulatory Visit (HOSPITAL_BASED_OUTPATIENT_CLINIC_OR_DEPARTMENT_OTHER): Payer: Self-pay

## 2014-02-10 ENCOUNTER — Other Ambulatory Visit: Payer: Self-pay

## 2014-02-10 DIAGNOSIS — C9000 Multiple myeloma not having achieved remission: Secondary | ICD-10-CM

## 2014-02-10 DIAGNOSIS — M899 Disorder of bone, unspecified: Secondary | ICD-10-CM

## 2014-02-10 DIAGNOSIS — Z5112 Encounter for antineoplastic immunotherapy: Secondary | ICD-10-CM

## 2014-02-10 LAB — CHROMOSOME ANALYSIS, BONE MARROW

## 2014-02-10 MED ORDER — BORTEZOMIB CHEMO SQ INJECTION 3.5 MG (2.5MG/ML)
1.3000 mg/m2 | Freq: Once | INTRAMUSCULAR | Status: AC
  Administered 2014-02-10: 2.25 mg via SUBCUTANEOUS
  Filled 2014-02-10: qty 2.25

## 2014-02-10 MED ORDER — ZOLEDRONIC ACID 4 MG/100ML IV SOLN
4.0000 mg | Freq: Once | INTRAVENOUS | Status: AC
  Administered 2014-02-10: 4 mg via INTRAVENOUS
  Filled 2014-02-10: qty 100

## 2014-02-10 MED ORDER — ONDANSETRON HCL 8 MG PO TABS
8.0000 mg | ORAL_TABLET | Freq: Once | ORAL | Status: AC
  Administered 2014-02-10: 8 mg via ORAL

## 2014-02-10 MED ORDER — ONDANSETRON HCL 8 MG PO TABS
ORAL_TABLET | ORAL | Status: AC
  Filled 2014-02-10: qty 1

## 2014-02-10 NOTE — Patient Instructions (Signed)
[facility] Discharge Instructions for Patients Receiving Chemotherapy  Today you received the following chemotherapy agents Velcade.   To help prevent nausea and vomiting after your treatment, we encourage you to take your nausea medication as directed.    If you develop nausea and vomiting that is not controlled by your nausea medication, call the clinic.   BELOW ARE SYMPTOMS THAT SHOULD BE REPORTED IMMEDIATELY:  *FEVER GREATER THAN 100.5 F  *CHILLS WITH OR WITHOUT FEVER  NAUSEA AND VOMITING THAT IS NOT CONTROLLED WITH YOUR NAUSEA MEDICATION  *UNUSUAL SHORTNESS OF BREATH  *UNUSUAL BRUISING OR BLEEDING  TENDERNESS IN MOUTH AND THROAT WITH OR WITHOUT PRESENCE OF ULCERS  *URINARY PROBLEMS  *BOWEL PROBLEMS  UNUSUAL RASH Items with * indicate a potential emergency and should be followed up as soon as possible.  Feel free to call the clinic you have any questions or concerns. The clinic phone number is [phone].  Bortezomib injection What is this medicine? BORTEZOMIB (bor TEZ oh mib) is a chemotherapy drug. It slows the growth of cancer cells. This medicine is used to treat multiple myeloma, and certain lymphomas, such as mantle-cell lymphoma. This medicine may be used for other purposes; ask your health care provider or pharmacist if you have questions. COMMON BRAND NAME(S): Velcade What should I tell my health care provider before I take this medicine? They need to know if you have any of these conditions: -diabetes -heart disease -irregular heartbeat -liver disease -on hemodialysis -low blood counts, like low white blood cells, platelets, or hemoglobin -peripheral neuropathy -taking medicine for blood pressure -an unusual or allergic reaction to bortezomib, mannitol, boron, other medicines, foods, dyes, or preservatives -pregnant or trying to get pregnant -breast-feeding How should I use this medicine? This medicine is for injection into a vein or  for injection under the skin. It is given by a health care professional in a hospital or clinic setting. Talk to your pediatrician regarding the use of this medicine in children. Special care may be needed. Overdosage: If you think you have taken too much of this medicine contact a poison control center or emergency room at once. NOTE: This medicine is only for you. Do not share this medicine with others. What if I miss a dose? It is important not to miss your dose. Call your doctor or health care professional if you are unable to keep an appointment. What may interact with this medicine? This medicine may interact with the following medications: -ketoconazole -rifampin -ritonavir -St. John's Wort This list may not describe all possible interactions. Give your health care provider a list of all the medicines, herbs, non-prescription drugs, or dietary supplements you use. Also tell them if you smoke, drink alcohol, or use illegal drugs. Some items may interact with your medicine. What should I watch for while using this medicine? Visit your doctor for checks on your progress. This drug may make you feel generally unwell. This is not uncommon, as chemotherapy can affect healthy cells as well as cancer cells. Report any side effects. Continue your course of treatment even though you feel ill unless your doctor tells you to stop. You may get drowsy or dizzy. Do not drive, use machinery, or do anything that needs mental alertness until you know how this medicine affects you. Do not stand or sit up quickly, especially if you are an older patient. This reduces the risk of dizzy or fainting spells. In some cases, you may be given additional medicines to help with  side effects. Follow all directions for their use. Call your doctor or health care professional for advice if you get a fever, chills or sore throat, or other symptoms of a cold or flu. Do not treat yourself. This drug decreases your body's ability  to fight infections. Try to avoid being around people who are sick. This medicine may increase your risk to bruise or bleed. Call your doctor or health care professional if you notice any unusual bleeding. You may need blood work done while you are taking this medicine. In some patients, this medicine may cause a serious brain infection that may cause death. If you have any problems seeing, thinking, speaking, walking, or standing, tell your doctor right away. If you cannot reach your doctor, urgently seek other source of medical care. Do not become pregnant while taking this medicine. Women should inform their doctor if they wish to become pregnant or think they might be pregnant. There is a potential for serious side effects to an unborn child. Talk to your health care professional or pharmacist for more information. Do not breast-feed an infant while taking this medicine. Check with your doctor or health care professional if you get an attack of severe diarrhea, nausea and vomiting, or if you sweat a lot. The loss of too much body fluid can make it dangerous for you to take this medicine. What side effects may I notice from receiving this medicine? Side effects that you should report to your doctor or health care professional as soon as possible: -allergic reactions like skin rash, itching or hives, swelling of the face, lips, or tongue -breathing problems -changes in hearing -changes in vision -fast, irregular heartbeat -feeling faint or lightheaded, falls -pain, tingling, numbness in the hands or feet -right upper belly pain -seizures -swelling of the ankles, feet, hands -unusual bleeding or bruising -unusually weak or tired -vomiting -yellowing of the eyes or skin Side effects that usually do not require medical attention (report to your doctor or health care professional if they continue or are bothersome): -changes in emotions or moods -constipation -diarrhea -loss of  appetite -headache -irritation at site where injected -nausea This list may not describe all possible side effects. Call your doctor for medical advice about side effects. You may report side effects to FDA at 1-800-FDA-1088. Where should I keep my medicine? This drug is given in a hospital or clinic and will not be stored at home. NOTE: This sheet is a summary. It may not cover all possible information. If you have questions about this medicine, talk to your doctor, pharmacist, or health care provider.  2015, [employer] Standard. (2012-11-12 12:46:32)  Zoledronic Acid injection (Hypercalcemia, Oncology) What is this medicine? ZOLEDRONIC ACID (ZOE le dron ik AS id) lowers the amount of calcium loss from bone. It is used to treat too much calcium in your blood from cancer. It is also used to prevent complications of cancer that has spread to the bone. This medicine may be used for other purposes; ask your health care provider or pharmacist if you have questions. COMMON BRAND NAME(S): Zometa What should I tell my health care provider before I take this medicine? They need to know if you have any of these conditions: -aspirin-sensitive asthma -cancer, especially if you are receiving medicines used to treat cancer -dental disease or wear dentures -infection -kidney disease -receiving corticosteroids like dexamethasone or prednisone -an unusual or allergic reaction to zoledronic acid, other medicines, foods, dyes, or preservatives -pregnant or trying to get pregnant -  breast-feeding How should I use this medicine? This medicine is for infusion into a vein. It is given by a health care professional in a hospital or clinic setting. Talk to your pediatrician regarding the use of this medicine in children. Special care may be needed. Overdosage: If you think you have taken too much of this medicine contact a poison control center or emergency room at once. NOTE: This medicine is only for you.  Do not share this medicine with others. What if I miss a dose? It is important not to miss your dose. Call your doctor or health care professional if you are unable to keep an appointment. What may interact with this medicine? -certain antibiotics given by injection -NSAIDs, medicines for pain and inflammation, like ibuprofen or naproxen -some diuretics like bumetanide, furosemide -teriparatide -thalidomide This list may not describe all possible interactions. Give your health care provider a list of all the medicines, herbs, non-prescription drugs, or dietary supplements you use. Also tell them if you smoke, drink alcohol, or use illegal drugs. Some items may interact with your medicine. What should I watch for while using this medicine? Visit your doctor or health care professional for regular checkups. It may be some time before you see the benefit from this medicine. Do not stop taking your medicine unless your doctor tells you to. Your doctor may order blood tests or other tests to see how you are doing. Women should inform their doctor if they wish to become pregnant or think they might be pregnant. There is a potential for serious side effects to an unborn child. Talk to your health care professional or pharmacist for more information. You should make sure that you get enough calcium and vitamin D while you are taking this medicine. Discuss the foods you eat and the vitamins you take with your health care professional. Some people who take this medicine have severe bone, joint, and/or muscle pain. This medicine may also increase your risk for jaw problems or a broken thigh bone. Tell your doctor right away if you have severe pain in your jaw, bones, joints, or muscles. Tell your doctor if you have any pain that does not go away or that gets worse. Tell your dentist and dental surgeon that you are taking this medicine. You should not have major dental surgery while on this medicine. See your  dentist to have a dental exam and fix any dental problems before starting this medicine. Take good care of your teeth while on this medicine. Make sure you see your dentist for regular follow-up appointments. What side effects may I notice from receiving this medicine? Side effects that you should report to your doctor or health care professional as soon as possible: -allergic reactions like skin rash, itching or hives, swelling of the face, lips, or tongue -anxiety, confusion, or depression -breathing problems -changes in vision -eye pain -feeling faint or lightheaded, falls -jaw pain, especially after dental work -mouth sores -muscle cramps, stiffness, or weakness -trouble passing urine or change in the amount of urine Side effects that usually do not require medical attention (report to your doctor or health care professional if they continue or are bothersome): -bone, joint, or muscle pain -constipation -diarrhea -fever -hair loss -irritation at site where injected -loss of appetite -nausea, vomiting -stomach upset -trouble sleeping -trouble swallowing -weak or tired This list may not describe all possible side effects. Call your doctor for medical advice about side effects. You may report side effects to FDA at  1-800-FDA-1088. Where should I keep my medicine? This drug is given in a hospital or clinic and will not be stored at home. NOTE: This sheet is a summary. It may not cover all possible information. If you have questions about this medicine, talk to your doctor, pharmacist, or health care provider.  2015, [employer] Standard. (2012-06-28 13:03:13)

## 2014-02-11 ENCOUNTER — Inpatient Hospital Stay: Payer: Self-pay | Admitting: Internal Medicine

## 2014-02-12 ENCOUNTER — Telehealth: Payer: Self-pay

## 2014-02-12 NOTE — Telephone Encounter (Signed)
-----   Message from Ronnette Juniper, RN sent at 02/10/2014  4:13 PM EST ----- Regarding: 1st treatment 1st treatment: Velcade/ Zometa   Dr. Jana Hakim  [phone] (H)

## 2014-02-12 NOTE — Telephone Encounter (Signed)
S/w wife, pt slept most of yesterday but is more awake today. He is able to eat and drink, he is having BM and urinating OK. Wife said he did not have any pain. Asked to call us if any questions or concerns arise.

## 2014-02-13 ENCOUNTER — Ambulatory Visit (HOSPITAL_BASED_OUTPATIENT_CLINIC_OR_DEPARTMENT_OTHER): Payer: Self-pay | Admitting: Oncology

## 2014-02-13 ENCOUNTER — Ambulatory Visit (HOSPITAL_BASED_OUTPATIENT_CLINIC_OR_DEPARTMENT_OTHER): Payer: Self-pay

## 2014-02-13 ENCOUNTER — Other Ambulatory Visit (HOSPITAL_BASED_OUTPATIENT_CLINIC_OR_DEPARTMENT_OTHER): Payer: Self-pay

## 2014-02-13 VITALS — BP 117/76 | HR 83 | Temp 98.4°F | Resp 18 | Ht 62.0 in | Wt 153.3 lb

## 2014-02-13 DIAGNOSIS — M899 Disorder of bone, unspecified: Secondary | ICD-10-CM

## 2014-02-13 DIAGNOSIS — M898X9 Other specified disorders of bone, unspecified site: Secondary | ICD-10-CM

## 2014-02-13 DIAGNOSIS — D630 Anemia in neoplastic disease: Secondary | ICD-10-CM

## 2014-02-13 DIAGNOSIS — N183 Chronic kidney disease, stage 3 unspecified: Secondary | ICD-10-CM

## 2014-02-13 DIAGNOSIS — Z5112 Encounter for antineoplastic immunotherapy: Secondary | ICD-10-CM

## 2014-02-13 DIAGNOSIS — C9000 Multiple myeloma not having achieved remission: Secondary | ICD-10-CM

## 2014-02-13 LAB — COMPREHENSIVE METABOLIC PANEL (CC13)
ALT: 32 U/L (ref 0–55)
AST: 22 U/L (ref 5–34)
Albumin: 2 g/dL — ABNORMAL LOW (ref 3.5–5.0)
Alkaline Phosphatase: 115 U/L (ref 40–150)
Anion Gap: 9 mEq/L (ref 3–11)
BUN: 37.9 mg/dL — AB (ref 7.0–26.0)
CO2: 22 meq/L (ref 22–29)
CREATININE: 1.4 mg/dL — AB (ref 0.7–1.3)
Calcium: 7.4 mg/dL — ABNORMAL LOW (ref 8.4–10.4)
Chloride: 107 mEq/L (ref 98–109)
EGFR: 63 mL/min/{1.73_m2} — ABNORMAL LOW (ref >90)
Glucose: 113 mg/dl (ref 70–140)
POTASSIUM: 3.6 meq/L (ref 3.5–5.1)
SODIUM: 137 meq/L (ref 136–145)
Total Bilirubin: <0.2 mg/dL (ref 0.20–1.20)
Total Protein: 7.8 g/dL (ref 6.4–8.3)

## 2014-02-13 LAB — CBC WITH DIFFERENTIAL/PLATELET
BASO%: 0.1 % (ref 0.0–2.0)
BASOS ABS: 0 10*3/uL (ref 0.0–0.1)
EOS ABS: 0 10*3/uL (ref 0.0–0.5)
EOS%: 0.2 % (ref 0.0–7.0)
HCT: 32.2 % — ABNORMAL LOW (ref 38.4–49.9)
HGB: 10.6 g/dL — ABNORMAL LOW (ref 13.0–17.1)
LYMPH#: 3.1 10*3/uL (ref 0.9–3.3)
LYMPH%: 21.7 % (ref 14.0–49.0)
MCH: 30.7 pg (ref 27.2–33.4)
MCHC: 32.9 g/dL (ref 32.0–36.0)
MCV: 93.3 fL (ref 79.3–98.0)
MONO#: 1.5 10*3/uL — AB (ref 0.1–0.9)
MONO%: 10.4 % (ref 0.0–14.0)
NEUT%: 67.6 % (ref 39.0–75.0)
NEUTROS ABS: 9.7 10*3/uL — AB (ref 1.5–6.5)
Platelets: 450 10*3/uL — ABNORMAL HIGH (ref 140–400)
RBC: 3.45 10*6/uL — ABNORMAL LOW (ref 4.20–5.82)
RDW: 13.3 % (ref 11.0–14.6)
WBC: 14.3 10*3/uL — ABNORMAL HIGH (ref 4.0–10.3)

## 2014-02-13 LAB — PROTEIN / CREATININE RATIO, URINE
CREATININE, URINE: 99.4 mg/dL
PROTEIN CREATININE RATIO: 0.64 — AB (ref <0.15)
Total Protein, Urine: 64 mg/dL — ABNORMAL HIGH (ref 5–25)

## 2014-02-13 LAB — LACTATE DEHYDROGENASE (CC13): LDH: 218 U/L (ref 125–245)

## 2014-02-13 MED ORDER — ONDANSETRON HCL 8 MG PO TABS
ORAL_TABLET | ORAL | Status: AC
  Filled 2014-02-13: qty 1

## 2014-02-13 MED ORDER — SODIUM CHLORIDE 0.9 % IV SOLN
300.0000 mg/m2 | Freq: Once | INTRAVENOUS | Status: AC
  Administered 2014-02-13: 520 mg via INTRAVENOUS
  Filled 2014-02-13: qty 26

## 2014-02-13 MED ORDER — ONDANSETRON HCL 8 MG PO TABS
8.0000 mg | ORAL_TABLET | Freq: Once | ORAL | Status: AC
  Administered 2014-02-13: 8 mg via ORAL

## 2014-02-13 MED ORDER — BORTEZOMIB CHEMO SQ INJECTION 3.5 MG (2.5MG/ML)
1.3000 mg/m2 | Freq: Once | INTRAMUSCULAR | Status: AC
  Administered 2014-02-13: 2.25 mg via SUBCUTANEOUS
  Filled 2014-02-13: qty 2.25

## 2014-02-13 NOTE — Patient Instructions (Signed)
[facility] Discharge Instructions for Patients Receiving Chemotherapy  Today you received the following chemotherapy agents Velcade and Cytoxan  To help prevent nausea and vomiting after your treatment, we encourage you to take your nausea medication as prescribed.   If you develop nausea and vomiting that is not controlled by your nausea medication, call the clinic.   BELOW ARE SYMPTOMS THAT SHOULD BE REPORTED IMMEDIATELY:  *FEVER GREATER THAN 100.5 F  *CHILLS WITH OR WITHOUT FEVER  NAUSEA AND VOMITING THAT IS NOT CONTROLLED WITH YOUR NAUSEA MEDICATION  *UNUSUAL SHORTNESS OF BREATH  *UNUSUAL BRUISING OR BLEEDING  TENDERNESS IN MOUTH AND THROAT WITH OR WITHOUT PRESENCE OF ULCERS  *URINARY PROBLEMS  *BOWEL PROBLEMS  UNUSUAL RASH Items with * indicate a potential emergency and should be followed up as soon as possible.  Feel free to call the clinic you have any questions or concerns. The clinic phone number is [phone].

## 2014-02-13 NOTE — Progress Notes (Signed)
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 The pain was worse with coughing. Exam was unremarkable, and he was treated with Mobic. No labs were obtained. On 01/30/2014 he presented to the emergency room at [facility] with similar complaints and also reported a 10 pound weight loss over the past month. A chest x-ray was significant only for mild atelectasis, but ACT NGO of the chest 01/30/2014, while it showed no pulmonary emboli, showed an osteolytic lesion destroying the manubrium of the sternum. There was extra osseous extension into the anterior mediastinum.  The patient was admitted and found to have a creatinine of 1.64 with a GFR of 51. Bilateral renal ultrasound showed no hydronephrosis or masses, and normal size and cortical thickness of both kidneys, though there was mild diffuse echogenicity. Urine total protein was 145 mg/dL and immunofixation showed a monoclonal IgG kappa protein as well as monoclonal free kappa light chains. On 01/31/2014 serum protein electrophoresis showed an M spike of 2.22 g/dL with a total protein of 8.3 and albumin 3.4. Kappa lambda light chains from the serum obtained 02/04/2014 showed 10.50 mg/dL on the, 1.74 in the lambda, with an elevated ratio at 6.03. Beta-2 microglobulin was 7.36. LDH was normal at 184 and HIV antibody was nonreactive.  Biopsy of  the manubrial mass 02/03/2014 showed extensive infiltration of the bone by sheets of atypical plasma cells, positive for CD138, kappa restricted (SZA 16-23). Right iliac bone marrow biopsy [name] 05/21/2014 (FZB 16-1) showed an average of 12% plasma cells with numerous aggregates and sheets of atypical plasma cells, which were kappa restricted. Cytogenetic analysis is pending  The patient's subsequent history is as detailed below  INTERVAL HISTORY: Charles [name] returns today for follow-up of his multiple myeloma accompanied by his wife [name] and his daughter [name], who can serve as his interpreter. Today is day 4 cycle 1 of his bortezomib/dexamethasone/cyclophosphamide treatments. The patient continues on dexamethasone 20 mg every Tuesday and Wednesday. He is tolerating that with no side effects that he is aware of. He received his first bortezomib and his first zolendronate doses 02/10/2014. He felt tired the next and spent most of the day in bed, but felt much better yesterday and feels fine today. He is to receive his first cyclophosphamide treatment today  REVIEW OF SYSTEMS: He has a thermometer at home and knows how to use it. He tells me his temperature rose 200.3 this past weekend. He did not have any associated symptoms and that has resolved. He is sleeping poorly. He is got occasional headaches. Otherwise a detailed review of systems today was stable  PAST MEDICAL HISTORY: Past Medical History  Diagnosis Date  . IgG myeloma   . CKD (chronic kidney disease), stage III   . Hypercholesterolemia   . Lytic bone lesions on xray     PAST SURGICAL HISTORY: No past surgical history on file.  FAMILY HISTORY No family history on file. The patient's parents  are still living, in their late 85's. The patient has 2 brothers, 3 sisters. There is noc cancer in the fmaily to his knowledge.  SOCIAL HISTORY:  Originally from [facility], moved here 19 years ago. He works in Scientist, research (physical sciences). Married (wife's name is [name]), lives in [place]. He has 5 children ages 7, 4, 24, 40 and 31 months [name], [name], [name] and [place]) in good health. Best number to call him is [phone]    ADVANCED DIRECTIVES: not  In place   HEALTH MAINTENANCE: History  Substance Use Topics  . Smoking status: Never Smoker   . Smokeless tobacco: Never Used  . Alcohol Use: No     Colonoscopy:  PSA:  Bone density:  Lipid panel:  No Known Allergies  Current Outpatient Prescriptions  Medication Sig Dispense Refill  . acetaminophen (TYLENOL) 325 MG tablet Take 2 tablets (650 mg total) by mouth every 6 (six) hours as needed for mild pain (or Fever >/= 101).    Marland Kitchen acyclovir (ZOVIRAX) 400 MG tablet Take 1 tablet (400 mg total) by mouth 5 (five) times daily. 60 tablet 6  . dexamethasone (DECADRON) 4 MG tablet Tome 5 pastillas al desayuno cada martes y miercoles (take 5 tablets w breakfast every Tuesday and wes) 50 tablet 12  . oxyCODONE (OXY IR/ROXICODONE) 5 MG immediate release tablet Take 1 tablet (5 mg total) by mouth every 6 (six) hours as needed for moderate pain. 20 tablet 0   No current facility-administered medications for this visit.    OBJECTIVE: Charles [name] man in no acute distress Filed Vitals:   02/13/14 1358  BP: 117/76  Pulse: 83  Temp: 98.4 F (36.9 C)  Resp: 18     Body mass index is 28.03 kg/(m^2).    ECOG FS:1 - Symptomatic but completely ambulatory  Sclerae unicteric, pupils equal and reactive Oropharynx clear and moist-- no thrush or other lesions No cervical or supraclavicular adenopathy Lungs no rales or rhonchi Heart regular rate and rhythm Abd soft, nontender, positive bowel sounds MSK no focal spinal tenderness, no joint edema Neuro: nonfocal, well oriented, appropriate affect   LAB RESULTS:  CMP     Component Value Date/Time   NA 138 02/07/2014 1415   NA 135 02/04/2014 0534   K 3.9 02/07/2014 1415   K 4.0 02/04/2014 0534    CL 103 02/04/2014 0534   CO2 30* 02/07/2014 1415   CO2 26 02/04/2014 0534   GLUCOSE 103 02/07/2014 1415   GLUCOSE 115* 02/04/2014 0534   BUN 27.2* 02/07/2014 1415   BUN 20 02/04/2014 0534   CREATININE 1.3 02/07/2014 1415   CREATININE 1.75* 02/04/2014 0534   CALCIUM 10.2 02/07/2014 1415   CALCIUM 10.6* 02/04/2014 0534   PROT 7.9 02/07/2014 1415   PROT 7.5 02/04/2014 0534   ALBUMIN 2.0* 02/07/2014 1415   ALBUMIN 1.9* 02/04/2014 0534   AST 19 02/07/2014 1415   AST 24 02/04/2014 0534   ALT 17 02/07/2014 1415   ALT 18 02/04/2014 0534   ALKPHOS 110 02/07/2014 1415   ALKPHOS 102 02/04/2014 0534   BILITOT <0.20 02/07/2014 1415   BILITOT 0.4 02/04/2014 0534   GFRNONAA 47* 02/04/2014 0534   GFRAA 55* 02/04/2014 0534    INo results found for: SPEP, UPEP  Lab Results  Component Value Date   WBC 14.3* 02/13/2014   NEUTROABS 9.7* 02/13/2014   HGB 10.6* 02/13/2014   HCT 32.2* 02/13/2014   MCV 93.3 02/13/2014   PLT 450* 02/13/2014  Chemistry      Component Value Date/Time   NA 138 02/07/2014 1415   NA 135 02/04/2014 0534   K 3.9 02/07/2014 1415   K 4.0 02/04/2014 0534   CL 103 02/04/2014 0534   CO2 30* 02/07/2014 1415   CO2 26 02/04/2014 0534   BUN 27.2* 02/07/2014 1415   BUN 20 02/04/2014 0534   CREATININE 1.3 02/07/2014 1415   CREATININE 1.75* 02/04/2014 0534      Component Value Date/Time   CALCIUM 10.2 02/07/2014 1415   CALCIUM 10.6* 02/04/2014 0534   ALKPHOS 110 02/07/2014 1415   ALKPHOS 102 02/04/2014 0534   AST 19 02/07/2014 1415   AST 24 02/04/2014 0534   ALT 17 02/07/2014 1415   ALT 18 02/04/2014 0534   BILITOT <0.20 02/07/2014 1415   BILITOT 0.4 02/04/2014 0534       No results found for: LABCA2  No components found for: LABCA125  No results for input(s): INR in the last 168 hours.  Urinalysis    Component Value Date/Time   COLORURINE YELLOW 01/31/2014 1050   APPEARANCEUR CLOUDY* 01/31/2014 1050   LABSPEC 1.019 01/31/2014 1050    PHURINE 6.0 01/31/2014 1050   GLUCOSEU NEGATIVE 01/31/2014 1050   HGBUR LARGE* 01/31/2014 1050   BILIRUBINUR NEGATIVE 01/31/2014 1050   KETONESUR NEGATIVE 01/31/2014 1050   PROTEINUR 100* 01/31/2014 1050   UROBILINOGEN 0.2 01/31/2014 1050   NITRITE NEGATIVE 01/31/2014 1050   LEUKOCYTESUR NEGATIVE 01/31/2014 1050    STUDIES: Ct Abdomen Pelvis Wo Contrast  02/03/2014   CLINICAL DATA:  Sternal lesion and other bone lesions by bone scan. Imaging is performed prior to planned bone marrow biopsy and potential biopsy of either the sternal lesion or other bony lesions.  EXAM: CT ABDOMEN AND PELVIS WITHOUT CONTRAST  TECHNIQUE: Multidetector CT imaging of the abdomen and pelvis was performed following the standard protocol without IV contrast. IV contrast was not administered due to renal insufficiency.  COMPARISON:  Bone scan and renal ultrasound on 02/02/2014 and CT of the chest on 01/30/2014.  FINDINGS: There is a healing fracture noted of the anterior right fifth rib which accounts for focal bone scan activity in this region. No bony lesion is seen in the region of the right sacrum/ sacroiliac joint where there was suggestion of abnormal activity by bone scan. There is a sclerotic bone island in the left sacrum.  No enlarged lymph nodes are identified. Unenhanced appearance of the liver, gallbladder, pancreas, adrenal glands and kidneys are unremarkable. Small 9 mm cortical lesion of the lateral mid left kidney appears of increased density by unenhanced CT and is consistent with hemorrhagic cyst or complex cystic lesion. This was not definitively visualized by renal ultrasound yesterday.  Bowel loops are unremarkable. No abnormal fluid collections are identified. The bladder is unremarkable. No hernias are seen.  IMPRESSION: 1. No focal bony lesions are identified in the spine or bony pelvis. 2. Healing anterior right fifth rib fracture which accounts for focal activity by bone scan. 3. 9 mm cortical lesion of  the left kidney which is partially exophytic. This may be a hemorrhagic cyst or complex cystic lesion. At some point, elective MRI of the abdomen would be recommended for further evaluation. If the patient's renal function improves, MRI with and without gadolinium may be possible in the future.   Electronically Signed   By: Aletta Edouard M.D.   On: 02/03/2014 13:07   Dg Chest 2 View  01/30/2014   CLINICAL DATA:  Chest  pain for several weeks.  Shortness of breath.  EXAM: CHEST  2 VIEW  COMPARISON:  01/05/2014  FINDINGS: The cardiac silhouette, mediastinal and hilar contours are within normal limits and stable. Low lung volumes with vascular crowding and atelectasis. Vague densities in both lungs are most likely areas of atelectasis but. No infiltrate or effusion.  IMPRESSION: Low lung volumes with vascular crowding and streaky areas of atelectasis.   Electronically Signed   By: Kalman Jewels M.D.   On: 01/30/2014 14:50   Ct Angio Chest Pe W/cm &/or Wo Cm  01/30/2014   CLINICAL DATA:  Right-sided chest pain for several weeks. Coughing. Pulmonary embolism.  EXAM: CT ANGIOGRAPHY CHEST WITH CONTRAST  TECHNIQUE: Multidetector CT imaging of the chest was performed using the standard protocol during bolus administration of intravenous contrast. Multiplanar CT image reconstructions and MIPs were obtained to evaluate the vascular anatomy.  CONTRAST:  27m OMNIPAQUE IOHEXOL 350 MG/ML SOLN  COMPARISON:  01/30/2014 chest radiograph.  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 There is erosion of both the anterior and posterior cortex and cortical per made of osteolysis is present. There is retrosternal extension of the manubrial mass, with soft tissue density in the anterior mediastinum measuring 37 mm transverse by 14 mm AP. Despite the anterior sternal manubrium osteolysis, there does not appear to be anterior extraosseous extension of tumor.  The sternal body appears within normal  limits. No destructive rib lesions are visible. Thoracic spine appears within normal limits. No supraclavicular adenopathy is visible.  Cardiovascular: Negative for pulmonary embolism. Technically adequate study. The heart appears normal.  Lungs: Atelectasis.  No evidence of pulmonary metastatic disease.  Central airways: Patent.  Effusions: None.  Lymphadenopathy: No axillary adenopathy. No mediastinal or hilar adenopathy.  Esophagus: Normal.  Upper abdomen: Normal.  Other: Thyroid gland shows tiny low-density lesions that probably represents cysts.  Review of the MIP images confirms the above findings.  IMPRESSION: 1. Negative for pulmonary embolus or acute aortic abnormality. 2. Osteolytic lesion destroying the sternal manubrium with extraosseous extension of tumor into the anterior mediastinum. 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 In this location, the most common lesion is a chondrosarcoma however this does not have typical features of a chondrosarcoma and either metastatic disease, lymphoma or myeloma are more likely.   Electronically Signed   By: GDereck LigasM.D.   On: 01/30/2014 18:43   Nm Bone Scan Whole Body  02/02/2014   CLINICAL DATA:  Evaluate bone lesions seen on chest CT.  EXAM: NUCLEAR MEDICINE WHOLE BODY BONE SCAN  TECHNIQUE: Whole body anterior and posterior images were obtained approximately 3 hours after intravenous injection of radiopharmaceutical.  RADIOPHARMACEUTICALS:  25.0 MCi Technetium-99 MDP  COMPARISON:  Chest CT 01/30/2014  FINDINGS: There are numerous bone lesions involving the skull, ribs, sternum, spine and pelvis. Although bone scan is a 50/50 proposition with multiple myeloma the pattern on the chest CT and and numerous skull lesions would still favor that diagnosis. Metastatic disease is also  possible. I think this is unlikely lymphoma.  IMPRESSION: Numerous bone lesions, particularly involving the skull. Favor multiple myeloma.   Electronically Signed   By: MKalman JewelsM.D.   On: 02/02/2014 12:38   [place]al  02/02/2014   CLINICAL DATA:  Acute versus chronic stage 3 kidney disease.  EXAM: RENAL/URINARY TRACT  ULTRASOUND COMPLETE  COMPARISON:  None.  FINDINGS: Right Kidney:  Length: 11.7 cm. The kidney demonstrates normal renal cortical thickness but there is slight increased echogenicity suggesting medical renal disease. No hydronephrosis, renal mass or renal calculi.  Left Kidney:  Length: 12.1 cm. Normal renal cortical thickness but slight increased echogenicity suggesting medical renal disease. No hydronephrosis, renal mass or renal calculi.  Bladder:  Normal.  IMPRESSION: Normal size and cortical thickness of both kidneys.  Mild diffuse increased echogenicity suggesting medical renal disease.  No hydronephrosis or renal mass.   Electronically Signed   By: Kalman Jewels M.D.   On: 02/02/2014 12:14   Ct Biopsy  02/03/2014   CLINICAL DATA:  Lytic and expansile lesion involving the sternal manubrium. The patient requires biopsy of this lesion as well as additional bone marrow biopsy for workup of potential multiple myeloma of versus other pathology.  EXAM: 1. CT-GUIDED ASPIRATE AND CORE BONE MARROW BIOPSY OF THE RIGHT ILIAC BONE 2. CT-GUIDED CORE BIOPSY OF STERNAL MASS  ANESTHESIA/SEDATION: Versed 2.0 mg IV, Fentanyl 100 mcg IV  Total Moderate Sedation Time  28 minutes.  PROCEDURE: The procedure risks, benefits, and alternatives were explained to the patient. Questions regarding the procedure were encouraged and answered. The patient understands and consents to the procedure.  The right gluteal region was prepped with Betadine. Sterile gown and sterile gloves were used for the procedure. Local anesthesia was provided with 1% Lidocaine. Prior to the procedure, a time-out was performed.  CT of the  bony pelvis was performed in a prone position. Under CT guidance, an 11 gauge OnControl bone cutting needle was advanced from a posterior approach into the right iliac bone. Needle positioning was confirmed with CT. Initial non heparinized and heparinized aspirate samples were obtained of bone marrow. Core biopsy was performed with the OnControl drill system.  The patient was then rolled into a supine position. Imaging was performed through the sternum. After localizing a site for biopsy, the skin was prepped with Betadine and sterilely draped. Local anesthesia was provided with 1% lidocaine. A separate time-out procedure was performed.  A new 11 gauge OnControl needle was advanced through the anterior cortex of the sternum. Needle position was confirmed by CT. Two separate 11 gauge core biopsy samples were obtained and submitted in formalin. The needle was removed and additional CT performed.  COMPLICATIONS: No significant complications. Potential minimal soft tissue swelling/ hemorrhage deep to the sternum.  FINDINGS: Inspection of initial aspirate did reveal visible particles. An intact core biopsy was obtained.  Expansile and lytic lesion of the left half of the sternal manubrium was localized by CT. There is some underlying soft tissue swelling/thickening deep to the sternum extending into the anterior mediastinal fat. Biopsy of the sternal lesion yielded solid tissue. After biopsy, there may be a minimal amount of increased soft tissue swelling deep to the sternum.  IMPRESSION: 1. CT guided bone marrow biopsy of right posterior iliac bone with both aspirate and core samples obtained. 2. CT-guided core biopsy performed of the lytic sternal lesion in the left half of the manubrium.   Electronically Signed   By: Aletta Edouard M.D.   On: 02/03/2014 16:05   Ct Biopsy  02/03/2014   CLINICAL DATA:  Lytic and expansile lesion involving the sternal manubrium. The patient requires biopsy of this lesion as well as  additional bone marrow biopsy for workup of potential multiple myeloma of versus other pathology.  EXAM: 1. CT-GUIDED ASPIRATE AND CORE BONE MARROW BIOPSY  OF THE RIGHT ILIAC BONE 2. CT-GUIDED CORE BIOPSY OF STERNAL MASS  ANESTHESIA/SEDATION: Versed 2.0 mg IV, Fentanyl 100 mcg IV  Total Moderate Sedation Time  28 minutes.  PROCEDURE: The procedure risks, benefits, and alternatives were explained to the patient. Questions regarding the procedure were encouraged and answered. The patient understands and consents to the procedure.  The right gluteal region was prepped with Betadine. Sterile gown and sterile gloves were used for the procedure. Local anesthesia was provided with 1% Lidocaine. Prior to the procedure, a time-out was performed.  CT of the bony pelvis was performed in a prone position. Under CT guidance, an 11 gauge OnControl bone cutting needle was advanced from a posterior approach into the right iliac bone. Needle positioning was confirmed with CT. Initial non heparinized and heparinized aspirate samples were obtained of bone marrow. Core biopsy was performed with the OnControl drill system.  The patient was then rolled into a supine position. Imaging was performed through the sternum. After localizing a site for biopsy, the skin was prepped with Betadine and sterilely draped. Local anesthesia was provided with 1% lidocaine. A separate time-out procedure was performed.  A new 11 gauge OnControl needle was advanced through the anterior cortex of the sternum. Needle position was confirmed by CT. Two separate 11 gauge core biopsy samples were obtained and submitted in formalin. The needle was removed and additional CT performed.  COMPLICATIONS: No significant complications. Potential minimal soft tissue swelling/ hemorrhage deep to the sternum.  FINDINGS: Inspection of initial aspirate did reveal visible particles. An intact core biopsy was obtained.  Expansile and lytic lesion of the left half of the sternal  manubrium was localized by CT. There is some underlying soft tissue swelling/thickening deep to the sternum extending into the anterior mediastinal fat. Biopsy of the sternal lesion yielded solid tissue. After biopsy, there may be a minimal amount of increased soft tissue swelling deep to the sternum.  IMPRESSION: 1. CT guided bone marrow biopsy of right posterior iliac bone with both aspirate and core samples obtained. 2. CT-guided core biopsy performed of the lytic sternal lesion in the left half of the manubrium.   Electronically Signed   By: Aletta Edouard M.D.   On: 02/03/2014 16:05    ASSESSMENT: 39 y.o. 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 The patient is not a transplant candidate for financial reasons   PLAN: Charles [name] is tolerating his treatment well so far. He will receive his first cyclophosphamide dose today and we discussed the possible toxicities, side effects and  complications. At the low-dose he will be receiving I do not anticipate significant problems with nausea or vomiting. We did discuss axis issues. He may benefit from a port. For now however he is comfortable with our using a peripheral vein for these infusions.   Today we are repeating his myeloma lab work, which we will obtain every 21 days on day 4 of each cycle. This will give [place] the first aunt of whether we are actually making progress with these treatments.  The overall plan, then, is for him to enter remission. Once he is in plateau, we will try to switch him to lenalidomide alone for maintenance. He knows to call for any problems that may develop before the next visit here.   Charles Cruel, MD   02/13/2014 2:14 PM Medical Oncology and Hematology [facility] [address] [place], [place] [address] Tel. [phone]    Fax. [phone]

## 2014-02-17 ENCOUNTER — Ambulatory Visit (HOSPITAL_BASED_OUTPATIENT_CLINIC_OR_DEPARTMENT_OTHER): Payer: Self-pay

## 2014-02-17 DIAGNOSIS — M899 Disorder of bone, unspecified: Secondary | ICD-10-CM

## 2014-02-17 DIAGNOSIS — Z5112 Encounter for antineoplastic immunotherapy: Secondary | ICD-10-CM

## 2014-02-17 DIAGNOSIS — M898X9 Other specified disorders of bone, unspecified site: Secondary | ICD-10-CM

## 2014-02-17 DIAGNOSIS — C9000 Multiple myeloma not having achieved remission: Secondary | ICD-10-CM

## 2014-02-17 LAB — PROTEIN ELECTROPHORESIS, SERUM
ALBUMIN ELP: 34.2 % — AB (ref 55.8–66.1)
ALPHA-1-GLOBULIN: 6.2 % — AB (ref 2.9–4.9)
ALPHA-2-GLOBULIN: 22.3 % — AB (ref 7.1–11.8)
BETA 2: 2.5 % — AB (ref 3.2–6.5)
Beta Globulin: 5.1 % (ref 4.7–7.2)
Gamma Globulin: 29.7 % — ABNORMAL HIGH (ref 11.1–18.8)
M-Spike, %: 1.87 g/dL
TOTAL PROTEIN, SERUM ELECTROPHOR: 7.4 g/dL (ref 6.0–8.3)

## 2014-02-17 LAB — HEPATITIS B CORE ANTIBODY, IGM: HEP B C IGM: NONREACTIVE

## 2014-02-17 LAB — KAPPA/LAMBDA LIGHT CHAINS
KAPPA FREE LGHT CHN: 5.82 mg/dL — AB (ref 0.33–1.94)
Kappa:Lambda Ratio: 4.31 — ABNORMAL HIGH (ref 0.26–1.65)
Lambda Free Lght Chn: 1.35 mg/dL (ref 0.57–2.63)

## 2014-02-17 LAB — HEPATITIS B SURFACE ANTIGEN: Hepatitis B Surface Ag: NEGATIVE

## 2014-02-17 LAB — BETA 2 MICROGLOBULIN, SERUM: BETA 2 MICROGLOBULIN: 6.96 mg/L — AB (ref <=2.51)

## 2014-02-17 LAB — HEPATITIS C ANTIBODY: HCV Ab: NEGATIVE

## 2014-02-17 LAB — IGG, IGA, IGM
IGA: 142 mg/dL (ref 68–379)
IGG (IMMUNOGLOBIN G), SERUM: 2520 mg/dL — AB (ref 650–1600)
IgM, Serum: 73 mg/dL (ref 41–251)

## 2014-02-17 MED ORDER — ONDANSETRON HCL 8 MG PO TABS
8.0000 mg | ORAL_TABLET | Freq: Once | ORAL | Status: AC
  Administered 2014-02-17: 8 mg via ORAL

## 2014-02-17 MED ORDER — BORTEZOMIB CHEMO SQ INJECTION 3.5 MG (2.5MG/ML)
1.3000 mg/m2 | Freq: Once | INTRAMUSCULAR | Status: AC
  Administered 2014-02-17: 2.25 mg via SUBCUTANEOUS
  Filled 2014-02-17: qty 2.25

## 2014-02-17 MED ORDER — ONDANSETRON HCL 8 MG PO TABS
ORAL_TABLET | ORAL | Status: AC
  Filled 2014-02-17: qty 1

## 2014-02-17 NOTE — Patient Instructions (Signed)
Bortezomib injection Qu es este medicamento? El BORTEZOMIB es un agente quimioteraputico. Este medicamento reduce el crecimiento de las clulas cancerosas. Este medicamento se [place] en el tratamiento del mieloma [place], y ciertas linfomas como linfoma de clulas del [place]. Este medicamento puede ser utilizado para otros usos; si tiene alguna pregunta consulte con su proveedor de atencin mdica o con su farmacutico. MARCAS COMERCIALES DISPONIBLES: Velcade Qu le debo informar a mi profesional de la salud antes de tomar este medicamento? Necesita saber si usted presenta alguno de los [employer] o situaciones: -diabetes -enfermedad cardiaca -latidos cardacos irregulares -enfermedad heptica -en hemodialisis -bajos conteos sanguneos, incluyendo bajos conteos de glbulos blancos, plaquetas o hemoglobina -neuropata perifrica -si toma medicamentos para la presin sangunea -una reaccin alrgica o inusual al bortezomib, al manitol, al boro, a otros medicamentos, alimentos, colorantes o conservantes -si est embarazada o buscando quedar embarazada -si est amamantando a un beb Cmo debo utilizar este medicamento? Este medicamento se administra mediante inyeccin por va intravenosa o inyeccin por debajo de la piel. Lo administra un profesional de la salud calificado en un hospital o en un entorno clnico. Hable con su pediatra para informarse acerca del uso de este medicamento en nios. Puede requerir atencin especial. Sobredosis: Pngase en contacto inmediatamente con un centro toxicolgico o una sala de urgencia si usted cree que haya tomado demasiado medicamento. ATENCIN: [employer] es solo para usted. No comparta este medicamento con nadie. Qu sucede si me olvido de una dosis? Es importante no olvidar ninguna dosis. Informe a su mdico o a su profesional de la salud si no puede asistir a Photographer. Qu puede interactuar con este medicamento? Esta medicina  puede interactuar con los siguientes medicamentos: -quetoprofeno -rifampicina -ritonavir -hierba de [place] Puede ser que esta lista no menciona todas las posibles interacciones. Informe a su profesional de [employer]	[place] de [employer] productos a base de hierbas, medicamentos de [place] o suplementos nutritivos que est tomando. Si usted fuma, consume bebidas alcohlicas o si utiliza drogas ilegales, indqueselo tambin a su profesional de [employer]	[place]. Algunas sustancias pueden interactuar con su medicamento. A qu debo estar atento al usar [employer]? Visite a su mdico para chequear su evolucin peridicamente. Este medicamento puede hacerle sentir un Nurse, mental health. Esto es normal ya que la quimioterapia afecta tanto a las clulas sanas como a las clulas cancerosas. Si presenta alguno de los [employer], infrmelos. Sin embargo, contine con el tratamiento aun si se siente enfermo, a menos que su mdico le indique que lo suspenda. Puede experimentar mareos o somnolencia. No conduzca ni utilice maquinaria ni haga nada que Associate Professor en estado de alerta hasta que sepa cmo le afecta este medicamento. No se siente ni se ponga de pie con rapidez, especialmente si es un paciente de edad avanzada. Esto reduce el riesgo de mareos o [employer]. En algunos casos, podr recibir [employer] para ayudarle con los efectos secundarios. Siga las instrucciones para usarlos. Consulte a su mdico o a su profesional de la salud por asesoramiento si tiene fiebre, escalofros, dolor de garganta o cualquier otro sntoma de resfro o gripe. No se trate usted mismo. Este medicamento puede reducir la capacidad del cuerpo para combatir infecciones. Trate de no acercarse a personas que estn enfermas. [employer] puede aumentar el riesgo de magulladuras o sangrado. Consulte a su mdico o a su profesional de la salud si observa sangrados inusuales. Usted podr Runner, broadcasting/film/video de  sangre mientras est usando este medicamento. En  algunos [place], este medicamento puede causar una infeccin cerebral grave que puede causar muerte. Si usted tiene [employer], pensar, Electrical engineer, caminar o estar de pie, informe a su mdico inmediatamente. Si no puede comunicarse con su mdico, buscar urgentemente otra fuente de asistencia mdica. No se debe quedar embarazada mientras recibe este medicamento. Las mujeres deben informar a su mdico si estn buscando quedar embarazadas o si creen que estn embarazadas. Existe la posibilidad de efectos secundarios graves a un beb sin nacer. Para ms informacin hable con su profesional de la salud o su farmacutico. No debe Economist a un beb mientras est usando este medicamento. Consulte con su mdico o profesional de la salud si tiene un ataque de diarrea severa, nuseas y vmitos, o si suda mucho. La prdida de demasiado lquido del cuerpo puede hacerlo peligroso para que usted toma [employer]. Qu efectos secundarios puedo tener al [employer] este medicamento? Efectos secundarios que debe informar a su mdico o a Barrister's clerk de la salud tan pronto como sea posible: -Chief of Staff como erupcin cutnea, picazn o urticarias, hinchazn de la cara, labios o lengua -problemas respiratorios -cambios de audicin -cambios en la visin -pulso cardiaco rpido, irregular -sensacin de desmayos o aturdimiento, cadas -dolor, hormigueo o entumecimiento de manos o pies -dolor en la regin abdominal superior derecha -convulsiones -hinchazn de los tobillos, pies, manos -sangrado o magulladuras inusuales -cansancio o debilidad inusual -vmito -color amarillento de los ojos o la piel Efectos secundarios que, por lo general, no requieren atencin mdica (debe informarlos a su mdico o a su profesional de la salud si persisten o si son molestos): -cambios de emociones o de humor -estreimiento -diarrea -prdida del apetito -dolor de  cabeza -Actor de la inyeccin -nuseas Puede ser que esta lista no menciona todos los posibles efectos secundarios. Comunquese a su mdico por asesoramiento mdico [employer] Usted puede informar los efectos secundarios a la FDA por telfono al 1-800-FDA-1088. Dnde debo guardar mi medicina? Este medicamento se administra en hospitales o clnicas y no necesitar guardarlo en su domicilio. ATENCIN: Este folleto es un resumen. Puede ser que no cubra toda la posible informacin. Si usted tiene preguntas acerca de esta medicina, consulte con su mdico, su farmacutico o su profesional de Technical sales engineer.  2015, [employer]. (2012-11-16 16:22:32)

## 2014-02-20 ENCOUNTER — Ambulatory Visit (HOSPITAL_BASED_OUTPATIENT_CLINIC_OR_DEPARTMENT_OTHER): Payer: Self-pay | Admitting: Nurse Practitioner

## 2014-02-20 ENCOUNTER — Other Ambulatory Visit (HOSPITAL_BASED_OUTPATIENT_CLINIC_OR_DEPARTMENT_OTHER): Payer: Self-pay

## 2014-02-20 ENCOUNTER — Encounter (HOSPITAL_COMMUNITY): Payer: Self-pay

## 2014-02-20 ENCOUNTER — Encounter: Payer: Self-pay | Admitting: Nurse Practitioner

## 2014-02-20 ENCOUNTER — Ambulatory Visit (HOSPITAL_BASED_OUTPATIENT_CLINIC_OR_DEPARTMENT_OTHER): Payer: Self-pay

## 2014-02-20 VITALS — BP 127/82 | HR 75 | Temp 97.5°F | Resp 18 | Ht 62.0 in | Wt 154.2 lb

## 2014-02-20 DIAGNOSIS — N183 Chronic kidney disease, stage 3 unspecified: Secondary | ICD-10-CM

## 2014-02-20 DIAGNOSIS — C9000 Multiple myeloma not having achieved remission: Secondary | ICD-10-CM

## 2014-02-20 DIAGNOSIS — Z5111 Encounter for antineoplastic chemotherapy: Secondary | ICD-10-CM

## 2014-02-20 DIAGNOSIS — M899 Disorder of bone, unspecified: Secondary | ICD-10-CM

## 2014-02-20 DIAGNOSIS — D630 Anemia in neoplastic disease: Secondary | ICD-10-CM

## 2014-02-20 DIAGNOSIS — Z5112 Encounter for antineoplastic immunotherapy: Secondary | ICD-10-CM

## 2014-02-20 DIAGNOSIS — M898X9 Other specified disorders of bone, unspecified site: Secondary | ICD-10-CM

## 2014-02-20 LAB — CBC WITH DIFFERENTIAL/PLATELET
BASO%: 0.1 % (ref 0.0–2.0)
BASOS ABS: 0 10*3/uL (ref 0.0–0.1)
EOS ABS: 0 10*3/uL (ref 0.0–0.5)
EOS%: 0.1 % (ref 0.0–7.0)
HEMATOCRIT: 32.2 % — AB (ref 38.4–49.9)
HGB: 10.5 g/dL — ABNORMAL LOW (ref 13.0–17.1)
LYMPH%: 18.2 % (ref 14.0–49.0)
MCH: 30.5 pg (ref 27.2–33.4)
MCHC: 32.6 g/dL (ref 32.0–36.0)
MCV: 93.6 fL (ref 79.3–98.0)
MONO#: 1 10*3/uL — ABNORMAL HIGH (ref 0.1–0.9)
MONO%: 7.5 % (ref 0.0–14.0)
NEUT#: 10.2 10*3/uL — ABNORMAL HIGH (ref 1.5–6.5)
NEUT%: 74.1 % (ref 39.0–75.0)
PLATELETS: 436 10*3/uL — AB (ref 140–400)
RBC: 3.44 10*6/uL — ABNORMAL LOW (ref 4.20–5.82)
RDW: 14 % (ref 11.0–14.6)
WBC: 13.8 10*3/uL — ABNORMAL HIGH (ref 4.0–10.3)
lymph#: 2.5 10*3/uL (ref 0.9–3.3)

## 2014-02-20 MED ORDER — BORTEZOMIB CHEMO SQ INJECTION 3.5 MG (2.5MG/ML)
1.3000 mg/m2 | Freq: Once | INTRAMUSCULAR | Status: AC
  Administered 2014-02-20: 2.25 mg via SUBCUTANEOUS
  Filled 2014-02-20: qty 2.25

## 2014-02-20 MED ORDER — ONDANSETRON HCL 8 MG PO TABS
ORAL_TABLET | ORAL | Status: AC
  Filled 2014-02-20: qty 1

## 2014-02-20 MED ORDER — ONDANSETRON HCL 8 MG PO TABS
8.0000 mg | ORAL_TABLET | Freq: Once | ORAL | Status: AC
Start: 2014-02-20 — End: 2014-02-20
  Administered 2014-02-20: 8 mg via ORAL

## 2014-02-20 MED ORDER — SODIUM CHLORIDE 0.9 % IV SOLN
300.0000 mg/m2 | Freq: Once | INTRAVENOUS | Status: AC
  Administered 2014-02-20: 520 mg via INTRAVENOUS
  Filled 2014-02-20: qty 26

## 2014-02-20 NOTE — Progress Notes (Signed)
[place]  Telephone:(336) [phone] Fax:(336) [phone]     ID: Charles [name] DOB: 07-31-1975  MR#: [id number]  GBM#:[id number]  Patient Care Team: Charles Garter, MD as PCP - General (Internal Medicine) PCP: Charles Chessman, MD GYN: SU:  OTHER MD:  CHIEF COMPLAINT: multiple myeloma  CURRENT TREATMENT: bortezomib, cyclophosphamide, dexamethasone, zolendronate   HISTORY  OF MULTIPLE MYELOMA: From the original intake note January 2016:  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 The pain was worse with coughing. Exam was unremarkable, and he was treated with Mobic. No labs were obtained. On 01/30/2014 he presented to the emergency room at [name] with similar complaints and also reported a 10 pound weight loss over the past month. A chest x-ray was significant only for mild atelectasis, but ACT NGO of the chest 01/30/2014, while it showed no pulmonary emboli, showed an osteolytic lesion destroying the manubrium of the sternum. There was extra osseous extension into the anterior mediastinum.  The patient was admitted and found to have a creatinine of 1.64 with a GFR of 51. Bilateral renal ultrasound showed no hydronephrosis or masses, and normal size and cortical thickness of both kidneys, though there was mild diffuse echogenicity. Urine total protein was 145 mg/dL and immunofixation showed a monoclonal IgG kappa protein as well as monoclonal free kappa light chains. On 01/31/2014 serum protein electrophoresis showed an M spike of 2.22 g/dL with a total protein of 8.3 and albumin 3.4. Kappa lambda light chains from the serum obtained 02/04/2014 showed 10.50 mg/dL on the, 1.74 in the lambda, with an elevated ratio at 6.03. Beta-2 microglobulin was 7.36. LDH was normal at 184 and HIV antibody was nonreactive.  Biopsy of  the manubrial mass 02/03/2014 showed extensive infiltration of the bone by sheets of atypical plasma cells, positive for CD138, kappa restricted (SZA 16-23). Right iliac bone marrow biopsy [name] 05/21/2014 (FZB 16-1) showed an average of 12% plasma cells with numerous aggregates and sheets of atypical plasma cells, which were kappa restricted. Cytogenetic analysis is pending  The patient's subsequent history is as detailed below  INTERVAL HISTORY: [name] returns today for follow-up of his multiple myeloma accompanied by his wife [name], who was our interpreter today. Today is day 11 cycle 1 of his bortezomib/dexamethasone/cyclophosphamide treatments. The patient continues on dexamethasone 20 mg every Tuesday and Wednesday.   REVIEW OF SYSTEMS: Charles [name] denies fevers or chills. He has some mild nausea, but no vomiting. He has diarrhea about 24-48 hours after treatment, but then this resolves. His appetite is decreased acutely after treatment, but returns with time. He denies mouth sores or rashes. He has pain to his upper back between his shoulder blades but does not take any meds for this. His wife notices that he is "short of breath" in his sleep, but Charles [name] himself does not notice this. Sometimes he has trouble falling or staying asleep. The dexamethasone likely contributes to this. His energy level is reduced.   PAST MEDICAL HISTORY: Past Medical History  Diagnosis Date  . IgG myeloma   . CKD (chronic kidney disease), stage III   . Hypercholesterolemia   . Lytic bone lesions on xray     PAST SURGICAL HISTORY: No past surgical history on file.  FAMILY HISTORY No family history on file. The patient's parents are still living, in their late 47's. The patient has  2 brothers, 3 sisters. There is noc cancer in the fmaily to his knowledge.  SOCIAL HISTORY:  Originally from [name], moved here 19 years ago. He works in Nurse, adult. Married (wife's name is  Charles [name]), lives in [place]. He has 5 children ages 54, 67, 57, 85 and 21 months Charles [name], Charles [name], Charles [name] and [place] [name]) in good health. Best number to call him is [phone]    ADVANCED DIRECTIVES: not  In place   HEALTH MAINTENANCE: History  Substance Use Topics  . Smoking status: Never Smoker   . Smokeless tobacco: Never Used  . Alcohol Use: No     Colonoscopy:  PSA:  Bone density:  Lipid panel:  No Known Allergies  Current Outpatient Prescriptions  Medication Sig Dispense Refill  . acyclovir (ZOVIRAX) 400 MG tablet Take 1 tablet (400 mg total) by mouth 5 (five) times daily. 60 tablet 6  . dexamethasone (DECADRON) 4 MG tablet Tome 5 pastillas al desayuno cada martes y miercoles (take 5 tablets w breakfast every Tuesday and wes) 50 tablet 12  . acetaminophen (TYLENOL) 325 MG tablet Take 2 tablets (650 mg total) by mouth every 6 (six) hours as needed for mild pain (or Fever >/= 101). (Patient not taking: Reported on 02/20/2014)    . oxyCODONE (OXY IR/ROXICODONE) 5 MG immediate release tablet Take 1 tablet (5 mg total) by mouth every 6 (six) hours as needed for moderate pain. (Patient not taking: Reported on 02/20/2014) 20 tablet 0   No current facility-administered medications for this visit.    OBJECTIVE: Charles [name] man in no acute distress Filed Vitals:   02/20/14 1028  BP: 127/82  Pulse: 75  Temp: 97.5 F (36.4 C)  Resp: 18     Body mass index is 28.2 kg/(m^2).    ECOG FS:1 - Symptomatic but completely ambulatory  Skin: warm, dry  HEENT: sclerae anicteric, conjunctivae pink, oropharynx clear. No thrush or mucositis.  Lymph Nodes: No cervical or supraclavicular lymphadenopathy  Lungs: clear to auscultation bilaterally, no rales, wheezes, or rhonci  Heart: regular rate and rhythm  Abdomen: round, soft, non tender, positive bowel sounds  Musculoskeletal: No focal spinal tenderness, no peripheral edema  Neuro: non focal, well oriented, positive affect    LAB RESULTS:  CMP     Component Value Date/Time   NA 137 02/13/2014 1322   NA 135 02/04/2014 0534   K 3.6 02/13/2014 1322   K 4.0 02/04/2014 0534   CL 103 02/04/2014 0534   CO2 22 02/13/2014 1322   CO2 26 02/04/2014 0534   GLUCOSE 113 02/13/2014 1322   GLUCOSE 115* 02/04/2014 0534   BUN 37.9* 02/13/2014 1322   BUN 20 02/04/2014 0534   CREATININE 1.4* 02/13/2014 1322   CREATININE 1.75* 02/04/2014 0534   CALCIUM 7.4* 02/13/2014 1322   CALCIUM 10.6* 02/04/2014 0534   PROT 7.8 02/13/2014 1322   PROT 7.5 02/04/2014 0534   ALBUMIN 2.0* 02/13/2014 1322   ALBUMIN 1.9* 02/04/2014 0534   AST 22 02/13/2014 1322   AST 24 02/04/2014 0534   ALT 32 02/13/2014 1322   ALT 18 02/04/2014 0534   ALKPHOS 115 02/13/2014 1322   ALKPHOS 102 02/04/2014 0534   BILITOT <0.20 02/13/2014 1322   BILITOT 0.4 02/04/2014 0534   GFRNONAA 47* 02/04/2014 0534   GFRAA 55* 02/04/2014 0534    INo results found for: SPEP, UPEP  Lab Results  Component Value Date   WBC 13.8* 02/20/2014   NEUTROABS 10.2* 02/20/2014   HGB 10.5*  02/20/2014   HCT 32.2* 02/20/2014   MCV 93.6 02/20/2014   PLT 436* 02/20/2014      Chemistry      Component Value Date/Time   NA 137 02/13/2014 1322   NA 135 02/04/2014 0534   K 3.6 02/13/2014 1322   K 4.0 02/04/2014 0534   CL 103 02/04/2014 0534   CO2 22 02/13/2014 1322   CO2 26 02/04/2014 0534   BUN 37.9* 02/13/2014 1322   BUN 20 02/04/2014 0534   CREATININE 1.4* 02/13/2014 1322   CREATININE 1.75* 02/04/2014 0534      Component Value Date/Time   CALCIUM 7.4* 02/13/2014 1322   CALCIUM 10.6* 02/04/2014 0534   ALKPHOS 115 02/13/2014 1322   ALKPHOS 102 02/04/2014 0534   AST 22 02/13/2014 1322   AST 24 02/04/2014 0534   ALT 32 02/13/2014 1322   ALT 18 02/04/2014 0534   BILITOT <0.20 02/13/2014 1322   BILITOT 0.4 02/04/2014 0534       No results found for: LABCA2  No components found for: LABCA125  No results for input(s): INR in the last 168  hours.  Urinalysis    Component Value Date/Time   COLORURINE YELLOW 01/31/2014 1050   APPEARANCEUR CLOUDY* 01/31/2014 1050   LABSPEC 1.019 01/31/2014 1050   PHURINE 6.0 01/31/2014 1050   GLUCOSEU NEGATIVE 01/31/2014 1050   HGBUR LARGE* 01/31/2014 1050   BILIRUBINUR NEGATIVE 01/31/2014 1050   KETONESUR NEGATIVE 01/31/2014 1050   PROTEINUR 100* 01/31/2014 1050   UROBILINOGEN 0.2 01/31/2014 1050   NITRITE NEGATIVE 01/31/2014 1050   LEUKOCYTESUR NEGATIVE 01/31/2014 1050    STUDIES: Ct Abdomen Pelvis Wo Contrast  02/03/2014   CLINICAL DATA:  Sternal lesion and other bone lesions by bone scan. Imaging is performed prior to planned bone marrow biopsy and potential biopsy of either the sternal lesion or other bony lesions.  EXAM: CT ABDOMEN AND PELVIS WITHOUT CONTRAST  TECHNIQUE: Multidetector CT imaging of the abdomen and pelvis was performed following the standard protocol without IV contrast. IV contrast was not administered due to renal insufficiency.  COMPARISON:  Bone scan and renal ultrasound on 02/02/2014 and CT of the chest on 01/30/2014.  FINDINGS: There is a healing fracture noted of the anterior right fifth rib which accounts for focal bone scan activity in this region. No bony lesion is seen in the region of the right sacrum/ sacroiliac joint where there was suggestion of abnormal activity by bone scan. There is a sclerotic bone island in the left sacrum.  No enlarged lymph nodes are identified. Unenhanced appearance of the liver, gallbladder, pancreas, adrenal glands and kidneys are unremarkable. Small 9 mm cortical lesion of the lateral mid left kidney appears of increased density by unenhanced CT and is consistent with hemorrhagic cyst or complex cystic lesion. This was not definitively visualized by renal ultrasound yesterday.  Bowel loops are unremarkable. No abnormal fluid collections are identified. The bladder is unremarkable. No hernias are seen.  IMPRESSION: 1. No focal bony  lesions are identified in the spine or bony pelvis. 2. Healing anterior right fifth rib fracture which accounts for focal activity by bone scan. 3. 9 mm cortical lesion of the left kidney which is partially exophytic. This may be a hemorrhagic cyst or complex cystic lesion. At some point, elective MRI of the abdomen would be recommended for further evaluation. If the patient's renal function improves, MRI with and without gadolinium may be possible in the future.   Electronically Signed   By: Eulas Post  Kathlene Cote M.D.   On: 02/03/2014 13:07   Dg Chest 2 View  01/30/2014   CLINICAL DATA:  Chest pain for several weeks.  Shortness of breath.  EXAM: CHEST  2 VIEW  COMPARISON:  01/05/2014  FINDINGS: The cardiac silhouette, mediastinal and hilar contours are within normal limits and stable. Low lung volumes with vascular crowding and atelectasis. Vague densities in both lungs are most likely areas of atelectasis but. No infiltrate or effusion.  IMPRESSION: Low lung volumes with vascular crowding and streaky areas of atelectasis.   Electronically Signed   By: Kalman Jewels M.D.   On: 01/30/2014 14:50   Ct Angio Chest Pe W/cm &/or Wo Cm  01/30/2014   CLINICAL DATA:  Right-sided chest pain for several weeks. Coughing. Pulmonary embolism.  EXAM: CT ANGIOGRAPHY CHEST WITH CONTRAST  TECHNIQUE: Multidetector CT imaging of the chest was performed using the standard protocol during bolus administration of intravenous contrast. Multiplanar CT image reconstructions and MIPs were obtained to evaluate the vascular anatomy.  CONTRAST:  77m OMNIPAQUE IOHEXOL 350 MG/ML SOLN  COMPARISON:  01/30/2014 chest radiograph.  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 There is erosion of both the anterior and posterior cortex and cortical per made of osteolysis is present. There is retrosternal extension of the manubrial mass, with soft tissue density in the anterior mediastinum measuring 37 mm transverse by 14 mm  AP. Despite the anterior sternal manubrium osteolysis, there does not appear to be anterior extraosseous extension of tumor.  The sternal body appears within normal limits. No destructive rib lesions are visible. Thoracic spine appears within normal limits. No supraclavicular adenopathy is visible.  Cardiovascular: Negative for pulmonary embolism. Technically adequate study. The heart appears normal.  Lungs: Atelectasis.  No evidence of pulmonary metastatic disease.  Central airways: Patent.  Effusions: None.  Lymphadenopathy: No axillary adenopathy. No mediastinal or hilar adenopathy.  Esophagus: Normal.  Upper abdomen: Normal.  Other: Thyroid gland shows tiny low-density lesions that probably represents cysts.  Review of the MIP images confirms the above findings.  IMPRESSION: 1. Negative for pulmonary embolus or acute aortic abnormality. 2. Osteolytic lesion destroying the sternal manubrium with extraosseous extension of tumor into the anterior mediastinum. 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 In this location, the most common lesion is a chondrosarcoma however this does not have typical features of a chondrosarcoma and either metastatic disease, lymphoma or myeloma are more likely.   Electronically Signed   By: GDereck LigasM.D.   On: 01/30/2014 18:43   Nm Bone Scan Whole Body  02/02/2014   CLINICAL DATA:  Evaluate bone lesions seen on chest CT.  EXAM: NUCLEAR MEDICINE WHOLE BODY BONE SCAN  TECHNIQUE: Whole body anterior and posterior images were obtained approximately 3 hours after intravenous injection of radiopharmaceutical.  RADIOPHARMACEUTICALS:  25.0 MCi Technetium-99 MDP  COMPARISON:  Chest CT 01/30/2014  FINDINGS: There are numerous bone lesions involving the skull, ribs, sternum, spine and pelvis. Although bone scan is a  50/50 proposition with multiple myeloma the pattern on the chest CT and and numerous skull lesions would still favor that diagnosis. Metastatic disease is also possible. I think this is unlikely lymphoma.  IMPRESSION: Numerous bone lesions, particularly involving the skull. Favor multiple myeloma.   Electronically Signed   By: MKalman JewelsM.D.   On: 02/02/2014 12:38  US Renal  02/02/2014   CLINICAL DATA:  Acute versus chronic stage 3 kidney disease.  EXAM: RENAL/URINARY TRACT ULTRASOUND COMPLETE  COMPARISON:  None.  FINDINGS: Right Kidney:  Length: 11.7 cm. The kidney demonstrates normal renal cortical thickness but there is slight increased echogenicity suggesting medical renal disease. No hydronephrosis, renal mass or renal calculi.  Left Kidney:  Length: 12.1 cm. Normal renal cortical thickness but slight increased echogenicity suggesting medical renal disease. No hydronephrosis, renal mass or renal calculi.  Bladder:  Normal.  IMPRESSION: Normal size and cortical thickness of both kidneys.  Mild diffuse increased echogenicity suggesting medical renal disease.  No hydronephrosis or renal mass.   Electronically Signed   By: Kalman Jewels M.D.   On: 02/02/2014 12:14   Ct Biopsy  02/03/2014   CLINICAL DATA:  Lytic and expansile lesion involving the sternal manubrium. The patient requires biopsy of this lesion as well as additional bone marrow biopsy for workup of potential multiple myeloma of versus other pathology.  EXAM: 1. CT-GUIDED ASPIRATE AND CORE BONE MARROW BIOPSY OF THE RIGHT ILIAC BONE 2. CT-GUIDED CORE BIOPSY OF STERNAL MASS  ANESTHESIA/SEDATION: Versed 2.0 mg IV, Fentanyl 100 mcg IV  Total Moderate Sedation Time  28 minutes.  PROCEDURE: The procedure risks, benefits, and alternatives were explained to the patient. Questions regarding the procedure were encouraged and answered. The patient understands and consents to the procedure.  The right gluteal region was prepped with Betadine. Sterile  gown and sterile gloves were used for the procedure. Local anesthesia was provided with 1% Lidocaine. Prior to the procedure, a time-out was performed.  CT of the bony pelvis was performed in a prone position. Under CT guidance, an 11 gauge OnControl bone cutting needle was advanced from a posterior approach into the right iliac bone. Needle positioning was confirmed with CT. Initial non heparinized and heparinized aspirate samples were obtained of bone marrow. Core biopsy was performed with the OnControl drill system.  The patient was then rolled into a supine position. Imaging was performed through the sternum. After localizing a site for biopsy, the skin was prepped with Betadine and sterilely draped. Local anesthesia was provided with 1% lidocaine. A separate time-out procedure was performed.  A new 11 gauge OnControl needle was advanced through the anterior cortex of the sternum. Needle position was confirmed by CT. Two separate 11 gauge core biopsy samples were obtained and submitted in formalin. The needle was removed and additional CT performed.  COMPLICATIONS: No significant complications. Potential minimal soft tissue swelling/ hemorrhage deep to the sternum.  FINDINGS: Inspection of initial aspirate did reveal visible particles. An intact core biopsy was obtained.  Expansile and lytic lesion of the left half of the sternal manubrium was localized by CT. There is some underlying soft tissue swelling/thickening deep to the sternum extending into the anterior mediastinal fat. Biopsy of the sternal lesion yielded solid tissue. After biopsy, there may be a minimal amount of increased soft tissue swelling deep to the sternum.  IMPRESSION: 1. CT guided bone marrow biopsy of right posterior iliac bone with both aspirate and core samples obtained. 2. CT-guided core biopsy performed of the lytic sternal lesion in the left half of the manubrium.   Electronically Signed   By: Aletta Edouard M.D.   On: 02/03/2014  16:05   Ct Biopsy  02/03/2014   CLINICAL DATA:  Lytic and expansile lesion involving the sternal manubrium. The patient requires biopsy of this lesion as well as additional bone marrow biopsy  for workup of potential multiple myeloma of versus other pathology.  EXAM: 1. CT-GUIDED ASPIRATE AND CORE BONE MARROW BIOPSY OF THE RIGHT ILIAC BONE 2. CT-GUIDED CORE BIOPSY OF STERNAL MASS  ANESTHESIA/SEDATION: Versed 2.0 mg IV, Fentanyl 100 mcg IV  Total Moderate Sedation Time  28 minutes.  PROCEDURE: The procedure risks, benefits, and alternatives were explained to the patient. Questions regarding the procedure were encouraged and answered. The patient understands and consents to the procedure.  The right gluteal region was prepped with Betadine. Sterile gown and sterile gloves were used for the procedure. Local anesthesia was provided with 1% Lidocaine. Prior to the procedure, a time-out was performed.  CT of the bony pelvis was performed in a prone position. Under CT guidance, an 11 gauge OnControl bone cutting needle was advanced from a posterior approach into the right iliac bone. Needle positioning was confirmed with CT. Initial non heparinized and heparinized aspirate samples were obtained of bone marrow. Core biopsy was performed with the OnControl drill system.  The patient was then rolled into a supine position. Imaging was performed through the sternum. After localizing a site for biopsy, the skin was prepped with Betadine and sterilely draped. Local anesthesia was provided with 1% lidocaine. A separate time-out procedure was performed.  A new 11 gauge OnControl needle was advanced through the anterior cortex of the sternum. Needle position was confirmed by CT. Two separate 11 gauge core biopsy samples were obtained and submitted in formalin. The needle was removed and additional CT performed.  COMPLICATIONS: No significant complications. Potential minimal soft tissue swelling/ hemorrhage deep to the sternum.   FINDINGS: Inspection of initial aspirate did reveal visible particles. An intact core biopsy was obtained.  Expansile and lytic lesion of the left half of the sternal manubrium was localized by CT. There is some underlying soft tissue swelling/thickening deep to the sternum extending into the anterior mediastinal fat. Biopsy of the sternal lesion yielded solid tissue. After biopsy, there may be a minimal amount of increased soft tissue swelling deep to the sternum.  IMPRESSION: 1. CT guided bone marrow biopsy of right posterior iliac bone with both aspirate and core samples obtained. 2. CT-guided core biopsy performed of the lytic sternal lesion in the left half of the manubrium.   Electronically Signed   By: Aletta Edouard M.D.   On: 02/03/2014 16:05    ASSESSMENT: 39 y.o. 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 The patient is not a transplant candidate for financial reasons   PLAN: Coran  is doing well  today. He continues to tolerate treatments well over all. The labs were reviewed in detail and were stable. He will proceed with day 11, bortezomib and cyclophosphamide today as scheduled. He declined PRN antiemetics at this time.   [place] will continue will continue with dexamethasone as advised on Tuesdays and Wednesday. He is next due for cyclophosphamide next Thursday and velcade again on 2/2 . His next office visit will be on 2/4, prior to the start of cycle 2's first round of cyclophosphamide. He understands and agrees with this plan. He knows the goal of treatment in his case is remission. He has been encouraged to call with any issues that might arise before his next visit here.  Marcelino Duster, NP   02/20/2014 10:48 AM

## 2014-02-20 NOTE — Patient Instructions (Signed)
Marland Kitchen   BELOW ARE SYMPTOMS THAT SHOULD BE REPORTED IMMEDIATELY:  *FEVER GREATER THAN 101.0 F  *CHILLS WITH OR WITHOUT FEVER  NAUSEA AND VOMITING THAT IS NOT CONTROLLED WITH YOUR NAUSEA MEDICATION  *UNUSUAL SHORTNESS OF BREATH  *UNUSUAL BRUISING OR BLEEDING  TENDERNESS IN MOUTH AND THROAT WITH OR WITHOUT PRESENCE OF ULCERS  *URINARY PROBLEMS  *BOWEL PROBLEMS  UNUSUAL RASH Items with * indicate a potential emergency and should be followed up as soon as possible.  One of the nurses will contact you 24 hours after your treatment. Please let the nurse know about any problems that you may have experienced. Feel free to call the clinic you have any questions or concerns. The clinic phone number is [phone].   I have been informed and understand all the instructions given to me. I know to contact the clinic, my physician, or go to the Emergency Department if any problems should occur. I do not have any questions at this time, but understand that I may call the clinic during office hours or the Patient Navigator at [phone] should I have any questions or need assistance in obtaining follow up care.    __________________________________________  _____________  __________ Signature of Patient or Authorized Representative            Date                   Time    __________________________________________ Nurse's Signature   Cyclophosphamide injection Qu es este medicamento? La CICLOFOSFAMIDA es un agente quimioteraputico. Este medicamento reduce el crecimiento de las clulas cancerosas. Este medicamento se [place] en el tratamiento de varios tipos de cncer como linfoma, mieloma, leucemia, cncer de mama y cncer de ovarios, por ejemplo. Este medicamento puede ser utilizado para otros usos; si tiene alguna pregunta consulte con su proveedor de atencin mdica o con su farmacutico. MARCAS COMERCIALES DISPONIBLES: Cytoxan, Neosar Qu le debo informar a mi  profesional de la salud antes de tomar este medicamento? Necesita saber si usted presenta alguno de los siguientes problemas o situaciones: -trastorno sanguneo -antecedentes de quimioterapia -infeccin -enfermedad renal -enfermedad heptica -radioterapia reciente o en curso -tumors en la mdula sea -una reaccin alrgica o inusual a la ciclofosfamida, a otros agentes quimioteraputicos, a otros medicamentos, alimentos, colorantes o conservantes -si est embarazada o buscando quedar embarazada -si est amamantando a un beb Cmo debo utilizar este medicamento? Este medicamento normalmente se administra mediante inyeccin por va intravenosa o intramuscular o infusin por va intravenosa. Lo administra un profesional de la salud calificado en un hospital o en un entorno clnico. Hable con su pediatra para informarse acerca del uso de este medicamento en nios. Puede requerir atencin especial. Sobredosis: Pngase en contacto inmediatamente con un centro toxicolgico o una sala de urgencia si usted cree que haya tomado demasiado medicamento. ATENCIN: [employer] es solo para usted. No comparta este medicamento con nadie. Qu sucede si me olvido de una dosis? Es importante no olvidar ninguna dosis. Informe a su mdico o a su profesional de la salud si no puede asistir a Photographer. Qu puede interactuar con este medicamento? Esta medicina puede interactuar con los siguientes medicamentos: -amiodarona -anfotericina B -azatioprina -ciertos medicamentos antivricos para el VIH o SIDA, tales como inhibidores de la proteasa (indinavir, ritonavir) y zidovudina -ciertos medicamentos para la presin sangunea, tales como benazepril, captopril, enalapril, fosinopril, lisinopril, moexipril, monopril, perindopril, quinapril, ramipril, trandolapril -ciertos medicamentos para el cncer tales como antraciclnicos (daunorrubicina, doxorrubicina), busulfn, citarabina, paclitaxel,  pentostatina,  tamoxifeno, trastuzumab -ciertos diurticos, tales como clorotiazida, clortalidona, hidroclorotiazida, indapamida, metolazona -ciertos medicamentos que tratan o previenen cogulos sanguneos, como warfarina -ciertos relajantes musculares, tales como succinilcolina -ciclosporina -etanercept -indometacina -medicamentos para incrementar los conteos sanguneos, tales como filgrastim, pegfilgrastim, sargramostim -medicamentos utilizados para la anestesia general -metronidazol -natalizumab Puede ser que esta lista no menciona todas las posibles interacciones. Informe a su profesional de [employer]	[place] de [employer] productos a base de hierbas, medicamentos de [place] o suplementos nutritivos que est tomando. Si usted fuma, consume bebidas alcohlicas o si utiliza drogas ilegales, indqueselo tambin a su profesional de [employer]	[place]. Algunas sustancias pueden interactuar con su medicamento. A qu debo estar atento al usar [employer]? Visite a su mdico para chequear su evolucin. Este medicamento puede hacerle sentir un Nurse, mental health. Esto es normal ya que la quimioterapia afecta tanto a las clulas sanas como a las clulas cancerosas. Si presenta alguno de los [employer], infrmelos. Sin embargo, contine con el tratamiento aun si se siente enfermo, a menos que su mdico le indique que lo suspenda. Beba agua u otros lquidos como se le haya indicado. Orine con frecuencia, especialmente de noche. En algunos casos, podr recibir [employer] para ayudarle con los efectos secundarios. Siga las instrucciones de [place]. Consulte a su mdico o a su profesional de la salud por asesoramiento si tiene fiebre, escalofros, dolor de garganta o cualquier otro sntoma de resfro o gripe. No se trate usted mismo. Este medicamento puede reducir la capacidad del cuerpo para combatir infecciones. Trate de no acercarse a personas que estn enfermas. [employer] puede aumentar el riesgo de  magulladuras o sangrado. Consulte a su mdico o a su profesional de la salud si observa sangrados inusuales. Proceda con cuidado al cepillar sus dientes, usar hilo dental o Risk manager palillos para los dientes, ya que puede contraer una infeccin o Therapist, art con mayor facilidad. Si se somete a algn tratamiento dental, informe a su dentista que est recibiendo [employer]. Puede experimentar mareos o somnolencia. No conduzca ni utilice maquinaria ni haga nada que Associate Professor en estado de alerta hasta que sepa cmo le afecta este medicamento. No se debe quedar embarazada mientras recibe este medicamento o durante 1 ao despus de terminarlo. Las mujeres deben informar a su mdico si estn buscando quedar embarazadas o si creen que estn embarazadas. Los hombres no deben tener hijos mientras estn recibiendo Coca[employer] y durante 4 meses despus de terminarlo. Existe la posibilidad de efectos secundarios graves a un beb sin nacer. Para ms informacin hable con su profesional de la salud o su farmacutico. No debe Economist a un beb mientras est usando este medicamento. Este medicamento puede interferir con la capacidad de tener hijos. En algunas mujeres, este medicamento ha causado insuficiencia ovrica . En algunos hombres, este medicamento ha causado reduccin de los conteos de [place]. Usted debe consultarse con su mdico o su profesional de la salud si est preocupada por su fertilidad. Si va a someterse a una operacin, informe a su mdico o a su profesional de la salud que ha [employer]. Qu efectos secundarios puedo tener al [employer] este medicamento? Efectos secundarios que debe informar a su mdico o a Barrister's clerk de la salud tan pronto como sea posible: -reacciones alrgicas como erupcin cutnea, picazn o urticarias, hinchazn de la cara, labios o lengua -conteos sanguneos bajos - este medicamento puede reducir la cantidad de glbulos blancos, glbulos rojos y  plaquetas. Su riesgo de infeccin y Teacher, music  puede ser mayor. -signos de infeccin - fiebre o escalofros, tos, dolor de garganta, Social research officer, government o dificultad para Garment/textile technologist -signos de reduccin de plaquetas o sangrado - magulladuras, puntos rojos en la piel, heces de color oscuro o con aspecto alquitranado, sangre en la orina -signos de reduccin de glbulos rojos - cansancio o debilidad inusual, desmayos, aturdimiento -problemas respiratorios -orina oscura -mareos -palpitaciones -hinchazn de tobillos, pies o manos -dificultad para orinar o cambios en el volumen de orina -aumento de peso -color amarillento de los ojos o la piel Efectos secundarios que, por lo general, no requieren Geophysical data processor (debe informarlos a su mdico o a su profesional de la salud si persisten o si son molestos): -cambios en el color de las uas o piel -cada del cabello -ausencia de perodos menstruales -llagas en la boca -nuseas, vmito Puede ser que [employer] no menciona todos los posibles efectos secundarios. Comunquese a su mdico por asesoramiento mdico [employer] Usted puede informar los efectos secundarios a la FDA por telfono al 1-800-FDA-1088. Dnde debo guardar mi medicina? Este medicamento se administra en hospitales o clnicas y no necesitar guardarlo en su domicilio. ATENCIN: Este folleto es un resumen. Puede ser que no cubra toda la posible informacin. Si usted tiene preguntas acerca de esta medicina, consulte con su mdico, su farmacutico o su profesional de Technical sales engineer.  2015, Elsevier/Gold Standard. (2011-11-28 17:46:28) Bortezomib injection Qu es este medicamento? El BORTEZOMIB es un agente quimioteraputico. Este medicamento reduce el crecimiento de las clulas cancerosas. Este medicamento se [place] en el tratamiento del mieloma [place], y ciertas linfomas como linfoma de clulas del [place]. Este medicamento puede ser utilizado para otros usos; si tiene alguna pregunta  consulte con su proveedor de atencin mdica o con su farmacutico. MARCAS COMERCIALES DISPONIBLES: Velcade Qu le debo informar a mi profesional de la salud antes de tomar este medicamento? Necesita saber si usted presenta alguno de los [employer] o situaciones: -diabetes -enfermedad cardiaca -latidos cardacos irregulares -enfermedad heptica -en hemodialisis -bajos conteos sanguneos, incluyendo bajos conteos de glbulos blancos, plaquetas o hemoglobina -neuropata perifrica -si toma medicamentos para la presin sangunea -una reaccin alrgica o inusual al bortezomib, al manitol, al boro, a otros medicamentos, alimentos, colorantes o conservantes -si est embarazada o buscando quedar embarazada -si est amamantando a un beb Cmo debo utilizar este medicamento? Este medicamento se administra mediante inyeccin por va intravenosa o inyeccin por debajo de la piel. Lo administra un profesional de la salud calificado en un hospital o en un entorno clnico. Hable con su pediatra para informarse acerca del uso de este medicamento en nios. Puede requerir atencin especial. Sobredosis: Pngase en contacto inmediatamente con un centro toxicolgico o una sala de urgencia si usted cree que haya tomado demasiado medicamento. ATENCIN: [employer] es solo para usted. No comparta este medicamento con nadie. Qu sucede si me olvido de una dosis? Es importante no olvidar ninguna dosis. Informe a su mdico o a su profesional de la salud si no puede asistir a Photographer. Qu puede interactuar con este medicamento? Esta medicina puede interactuar con los siguientes medicamentos: -quetoprofeno -rifampicina -ritonavir -hierba de [place] Puede ser que esta lista no menciona todas las posibles interacciones. Informe a su profesional de [employer]	[place] de [employer] productos a base de hierbas, medicamentos de [place] o suplementos nutritivos que est tomando. Si usted fuma, consume bebidas  alcohlicas o si utiliza drogas ilegales, indqueselo tambin a su profesional de [employer]	[place]. Algunas sustancias pueden interactuar con su  medicamento. A qu debo estar atento al usar Coca[employer]Cola? Visite a su mdico para chequear su evolucin peridicamente. Este medicamento puede hacerle sentir un Nurse, mental health. Esto es normal ya que la quimioterapia afecta tanto a las clulas sanas como a las clulas cancerosas. Si presenta alguno de los [employer], infrmelos. Sin embargo, contine con el tratamiento aun si se siente enfermo, a menos que su mdico le indique que lo suspenda. Puede experimentar mareos o somnolencia. No conduzca ni utilice maquinaria ni haga nada que Associate Professor en estado de alerta hasta que sepa cmo le afecta este medicamento. No se siente ni se ponga de pie con rapidez, especialmente si es un paciente de edad avanzada. Esto reduce el riesgo de mareos o [employer]. En algunos casos, podr recibir [employer] para ayudarle con los efectos secundarios. Siga las instrucciones para usarlos. Consulte a su mdico o a su profesional de la salud por asesoramiento si tiene fiebre, escalofros, dolor de garganta o cualquier otro sntoma de resfro o gripe. No se trate usted mismo. Este medicamento puede reducir la capacidad del cuerpo para combatir infecciones. Trate de no acercarse a personas que estn enfermas. [employer] puede aumentar el riesgo de magulladuras o sangrado. Consulte a su mdico o a su profesional de la salud si observa sangrados inusuales. Usted podr Runner, broadcasting/film/video de sangre mientras est usando este medicamento. En algunos pacientes, este medicamento puede causar una infeccin cerebral grave que puede causar muerte. Si usted tiene [employer], pensar, Electrical engineer, caminar o estar de pie, informe a su mdico inmediatamente. Si no puede comunicarse con su mdico, buscar urgentemente otra fuente de asistencia mdica. No se debe  quedar embarazada mientras recibe este medicamento. Las mujeres deben informar a su mdico si estn buscando quedar embarazadas o si creen que estn embarazadas. Existe la posibilidad de efectos secundarios graves a un beb sin nacer. Para ms informacin hable con su profesional de la salud o su farmacutico. No debe Economist a un beb mientras est usando este medicamento. Consulte con su mdico o profesional de la salud si tiene un ataque de diarrea severa, nuseas y vmitos, o si suda mucho. La prdida de demasiado lquido del cuerpo puede hacerlo peligroso para que usted toma Coca[employer]a. Qu efectos secundarios puedo tener al [employer] este medicamento? Efectos secundarios que debe informar a su mdico o a Barrister's clerk de la salud tan pronto como sea posible: -Chief of Staff como erupcin cutnea, picazn o urticarias, hinchazn de la cara, labios o lengua -problemas respiratorios -cambios de audicin -cambios en la visin -pulso cardiaco rpido, irregular -sensacin de desmayos o aturdimiento, cadas -dolor, hormigueo o entumecimiento de manos o pies -dolor en la regin abdominal superior derecha -convulsiones -hinchazn de los tobillos, pies, manos -sangrado o magulladuras inusuales -cansancio o debilidad inusual -vmito -color amarillento de los ojos o la piel Efectos secundarios que, por lo general, no requieren atencin mdica (debe informarlos a su mdico o a su profesional de la salud si persisten o si son molestos): -cambios de emociones o de humor -estreimiento -diarrea -prdida del apetito -dolor de cabeza -Actor de la inyeccin -nuseas Puede ser que esta lista no menciona todos los posibles efectos secundarios. Comunquese a su mdico por asesoramiento mdico [employer] Usted puede informar los efectos secundarios a la FDA por telfono al 1-800-FDA-1088. Dnde debo guardar mi medicina? Este medicamento se administra en  hospitales o clnicas y no necesitar guardarlo en su domicilio. ATENCIN: Este folleto es  un resumen. Puede ser que no cubra toda la posible informacin. Si usted tiene preguntas acerca de esta medicina, consulte con su mdico, su farmacutico o su profesional de Technical sales engineer.  2015, Elsevier/Gold Standard. (2012-11-16 16:22:32)

## 2014-02-27 ENCOUNTER — Ambulatory Visit (HOSPITAL_BASED_OUTPATIENT_CLINIC_OR_DEPARTMENT_OTHER): Payer: Self-pay

## 2014-02-27 ENCOUNTER — Ambulatory Visit (HOSPITAL_COMMUNITY)
Admission: RE | Admit: 2014-02-27 | Discharge: 2014-02-27 | Disposition: A | Discharge status: Home / Self Care | Payer: MEDICAID | Source: Ambulatory Visit | Attending: Oncology | Admitting: Oncology

## 2014-02-27 DIAGNOSIS — M899 Disorder of bone, unspecified: Secondary | ICD-10-CM

## 2014-02-27 DIAGNOSIS — C9000 Multiple myeloma not having achieved remission: Secondary | ICD-10-CM

## 2014-02-27 DIAGNOSIS — M898X9 Other specified disorders of bone, unspecified site: Secondary | ICD-10-CM

## 2014-02-27 DIAGNOSIS — D630 Anemia in neoplastic disease: Secondary | ICD-10-CM | POA: Insufficient documentation

## 2014-02-27 DIAGNOSIS — Z5111 Encounter for antineoplastic chemotherapy: Secondary | ICD-10-CM

## 2014-02-27 LAB — CBC WITH DIFFERENTIAL/PLATELET
BASO%: 0.2 % (ref 0.0–2.0)
Basophils Absolute: 0 10*3/uL (ref 0.0–0.1)
EOS%: 0.1 % (ref 0.0–7.0)
Eosinophils Absolute: 0 10*3/uL (ref 0.0–0.5)
HCT: 20.3 % — ABNORMAL LOW (ref 38.4–49.9)
HGB: 6.4 g/dL — CL (ref 13.0–17.1)
LYMPH#: 2.5 10*3/uL (ref 0.9–3.3)
LYMPH%: 17.7 % (ref 14.0–49.0)
MCH: 29.8 pg (ref 27.2–33.4)
MCHC: 31.5 g/dL — AB (ref 32.0–36.0)
MCV: 94.3 fL (ref 79.3–98.0)
MONO#: 1.4 10*3/uL — AB (ref 0.1–0.9)
MONO%: 9.8 % (ref 0.0–14.0)
NEUT%: 72.2 % (ref 39.0–75.0)
NEUTROS ABS: 10.2 10*3/uL — AB (ref 1.5–6.5)
PLATELETS: 491 10*3/uL — AB (ref 140–400)
RBC: 2.15 10*6/uL — ABNORMAL LOW (ref 4.20–5.82)
RDW: 13.4 % (ref 11.0–14.6)
WBC: 14.1 10*3/uL — ABNORMAL HIGH (ref 4.0–10.3)

## 2014-02-27 LAB — COMPREHENSIVE METABOLIC PANEL (CC13)
ALBUMIN: 2 g/dL — AB (ref 3.5–5.0)
ALK PHOS: 134 U/L (ref 40–150)
ALT: 25 U/L (ref 0–55)
ANION GAP: 10 meq/L (ref 3–11)
AST: 15 U/L (ref 5–34)
BUN: 30.2 mg/dL — ABNORMAL HIGH (ref 7.0–26.0)
CALCIUM: 7.4 mg/dL — AB (ref 8.4–10.4)
CO2: 20 meq/L — AB (ref 22–29)
CREATININE: 1.5 mg/dL — AB (ref 0.7–1.3)
Chloride: 113 mEq/L — ABNORMAL HIGH (ref 98–109)
EGFR: 60 mL/min/{1.73_m2} — AB (ref >90)
GLUCOSE: 96 mg/dL (ref 70–140)
POTASSIUM: 4.1 meq/L (ref 3.5–5.1)
Sodium: 143 mEq/L (ref 136–145)
Total Protein: 5.9 g/dL — ABNORMAL LOW (ref 6.4–8.3)

## 2014-02-27 LAB — HOLD TUBE, BLOOD BANK

## 2014-02-27 LAB — ABO/RH: ABO/RH(D): O POS

## 2014-02-27 MED ORDER — SODIUM CHLORIDE 0.9 % IV SOLN
300.0000 mg/m2 | Freq: Once | INTRAVENOUS | Status: AC
  Administered 2014-02-27: 520 mg via INTRAVENOUS
  Filled 2014-02-27: qty 26

## 2014-02-27 MED ORDER — SODIUM CHLORIDE 0.9 % IV SOLN
Freq: Once | INTRAVENOUS | Status: AC
  Administered 2014-02-27: 14:00:00 via INTRAVENOUS

## 2014-02-27 NOTE — Progress Notes (Signed)
Patient reminded to keep blue bracelet on for blood transfusion 02/28/14. Pt and wife voice understanding.

## 2014-02-27 NOTE — Patient Instructions (Signed)
[facility] Discharge Instructions for Patients Receiving Chemotherapy  Today you received the following chemotherapy agents: Cytoxan  To help prevent nausea and vomiting after your treatment, we encourage you to take your nausea medication as prescribed by your physician.   If you develop nausea and vomiting that is not controlled by your nausea medication, call the clinic.   BELOW ARE SYMPTOMS THAT SHOULD BE REPORTED IMMEDIATELY:  *FEVER GREATER THAN 100.5 F  *CHILLS WITH OR WITHOUT FEVER  NAUSEA AND VOMITING THAT IS NOT CONTROLLED WITH YOUR NAUSEA MEDICATION  *UNUSUAL SHORTNESS OF BREATH  *UNUSUAL BRUISING OR BLEEDING  TENDERNESS IN MOUTH AND THROAT WITH OR WITHOUT PRESENCE OF ULCERS  *URINARY PROBLEMS  *BOWEL PROBLEMS  UNUSUAL RASH Items with * indicate a potential emergency and should be followed up as soon as possible.  Feel free to call the clinic you have any questions or concerns. The clinic phone number is [phone].

## 2014-02-27 NOTE — Progress Notes (Signed)
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 The pain was worse with coughing. Exam was unremarkable, and he was treated with Mobic. No labs were obtained. On 01/30/2014 he presented to the emergency room at [facility] with similar complaints and also reported a 10 pound weight loss over the past month. A chest x-ray was significant only for mild atelectasis, but ACT NGO of the chest 01/30/2014, while it showed no pulmonary emboli, showed an osteolytic lesion destroying the manubrium of the sternum. There was extra osseous extension into the anterior mediastinum.  The patient was admitted and found to have a creatinine of 1.64 with a GFR of 51. Bilateral renal ultrasound showed no hydronephrosis or masses, and normal size and cortical thickness of both kidneys, though there was mild diffuse echogenicity. Urine total protein was 145 mg/dL and immunofixation showed a monoclonal IgG kappa protein as well as monoclonal free kappa light chains. On 01/31/2014 serum protein electrophoresis showed an M spike of 2.22 g/dL with a total protein of 8.3 and albumin 3.4. Kappa lambda light chains from the serum obtained 02/04/2014 showed 10.50 mg/dL on the, 1.74 in the lambda, with an elevated ratio at 6.03. Beta-2 microglobulin was 7.36. LDH was normal at 184 and HIV antibody was nonreactive.  Biopsy of  the manubrial mass 02/03/2014 showed extensive infiltration of the bone by sheets of atypical plasma cells, positive for CD138, kappa restricted (SZA 16-23). Right iliac bone marrow biopsy Charles [name] 05/21/2014 (FZB 16-1) showed an average of 12% plasma cells with numerous aggregates and sheets of atypical plasma cells, which were kappa restricted. Cytogenetic analysis is pending  The patient's subsequent history is as detailed below  INTERVAL HISTORY: Charles [name] returns today for follow-up of his multiple myeloma. Today is day 4 cycle 2 of his bortezomib/dexamethasone/cyclophosphamide treatments, with bortezomib given on days 1, 4, 8, and 11 of every 21 day cycle, dexamethasone 20 mg taken every Tuesday and Wednesday, and cyclophosphamide infused weekly. Last week, Charles [name]'s hgb dropped to 6.4 and he received 2 units of blood the day after his cyclophosphamide infusion. He feels improved today.  REVIEW OF SYSTEMS: Charles [name] denies fevers or chills. He had some moderate nausea this past week and vomited once, but did not have any PRN antiemetics. He used to have diarrhea about 24-48 hours after treatment, but this week has not had this experience. His appetite is decreased acutely after treatment, but returns with time. He denies mouth sores or rashes. He has some sternal pain, but does not take anything for it. He denies cough, palpitations, or fatigue. His wife notices that he is "short of breath" in his sleep, but Charles [name] himself does not notice this. Sometimes he has trouble falling or staying asleep. The dexamethasone likely contributes to this. His energy level is reduced, but is manageable. A detailed review of systems is otherwise stable. i  PAST MEDICAL HISTORY: Past Medical History  Diagnosis Date  . IgG  myeloma   . CKD (chronic kidney disease), stage III   . Hypercholesterolemia   . Lytic bone lesions on xray     PAST SURGICAL HISTORY: No past surgical history on file.  FAMILY  HISTORY No family history on file. The patient's parents are still living, in their late 44's. The patient has 2 brothers, 3 sisters. There is noc cancer in the fmaily to his knowledge.  SOCIAL HISTORY:  Originally from [facility], moved here 19 years ago. He works in Nurse, adult. Married (wife's name is Charles [name]), lives in [place]. He has 5 children ages 49, 64, 62, 35 and 74 months Charles [name], Charles [name], Charles [name] and Charles [name]) in good health. Best number to call him is [phone]    ADVANCED DIRECTIVES: not  In place   HEALTH MAINTENANCE: History  Substance Use Topics  . Smoking status: Never Smoker   . Smokeless tobacco: Never Used  . Alcohol Use: No     Colonoscopy:  PSA:  Bone density:  Lipid panel:  No Known Allergies  Current Outpatient Prescriptions  Medication Sig Dispense Refill  . acyclovir (ZOVIRAX) 400 MG tablet Take 1 tablet (400 mg total) by mouth 5 (five) times daily. 60 tablet 6  . dexamethasone (DECADRON) 4 MG tablet Tome 5 pastillas al desayuno cada martes y miercoles (take 5 tablets w breakfast every Tuesday and wes) 50 tablet 12  . acetaminophen (TYLENOL) 325 MG tablet Take 2 tablets (650 mg total) by mouth every 6 (six) hours as needed for mild pain (or Fever >/= 101). (Patient not taking: Reported on 02/20/2014)    . ondansetron (ZOFRAN) 8 MG tablet Take 1 tablet (8 mg total) by mouth 2 (two) times daily. 20 tablet 0  . oxyCODONE (OXY IR/ROXICODONE) 5 MG immediate release tablet Take 1 tablet (5 mg total) by mouth every 6 (six) hours as needed for moderate pain. (Patient not taking: Reported on 02/20/2014) 20 tablet 0   No current facility-administered medications for this visit.    OBJECTIVE: Charles [name] man in no acute distress Filed Vitals:   03/06/14 0843  BP: 124/76  Pulse: 63  Temp: 98.5 F (36.9 C)  Resp: 18     Body mass index is 27.76 kg/(m^2).    ECOG FS:1 - Symptomatic but completely ambulatory  Skin: warm, dry   HEENT: sclerae anicteric, conjunctivae pink, oropharynx clear. No thrush or mucositis.  Lymph Nodes: No cervical or supraclavicular lymphadenopathy  Lungs: clear to auscultation bilaterally, no rales, wheezes, or rhonci  Heart: regular rate and rhythm  Abdomen: round, soft, non tender, positive bowel sounds  Musculoskeletal: No focal spinal tenderness, no peripheral edema  Neuro: non focal, well oriented, positive affect   LAB RESULTS:  CMP     Component Value Date/Time   NA 143 02/27/2014 1333   NA 135 02/04/2014 0534   K 4.1 02/27/2014 1333   K 4.0 02/04/2014 0534   CL 103 02/04/2014 0534   CO2 20* 02/27/2014 1333   CO2 26 02/04/2014 0534   GLUCOSE 96 02/27/2014 1333   GLUCOSE 115* 02/04/2014 0534   BUN 30.2* 02/27/2014 1333   BUN 20 02/04/2014 0534   CREATININE 1.5* 02/27/2014 1333   CREATININE 1.75* 02/04/2014 0534   CALCIUM 7.4* 02/27/2014 1333   CALCIUM 10.6* 02/04/2014 0534   PROT 5.9* 02/27/2014 1333   PROT 7.5 02/04/2014 0534   ALBUMIN 2.0* 02/27/2014 1333   ALBUMIN 1.9* 02/04/2014 0534   AST 15 02/27/2014 1333   AST 24 02/04/2014 0534  ALT 25 02/27/2014 1333   ALT 18 02/04/2014 0534   ALKPHOS 134 02/27/2014 1333   ALKPHOS 102 02/04/2014 0534   BILITOT <0.20 02/27/2014 1333   BILITOT 0.4 02/04/2014 0534   GFRNONAA 47* 02/04/2014 0534   GFRAA 55* 02/04/2014 0534    INo results found for: SPEP, UPEP  Lab Results  Component Value Date   WBC 10.4* 03/06/2014   NEUTROABS 7.0* 03/06/2014   HGB 11.2* 03/06/2014   HCT 34.3* 03/06/2014   MCV 95.0 03/06/2014   PLT 508* 03/06/2014      Chemistry      Component Value Date/Time   NA 143 02/27/2014 1333   NA 135 02/04/2014 0534   K 4.1 02/27/2014 1333   K 4.0 02/04/2014 0534   CL 103 02/04/2014 0534   CO2 20* 02/27/2014 1333   CO2 26 02/04/2014 0534   BUN 30.2* 02/27/2014 1333   BUN 20 02/04/2014 0534   CREATININE 1.5* 02/27/2014 1333   CREATININE 1.75* 02/04/2014 0534      Component Value  Date/Time   CALCIUM 7.4* 02/27/2014 1333   CALCIUM 10.6* 02/04/2014 0534   ALKPHOS 134 02/27/2014 1333   ALKPHOS 102 02/04/2014 0534   AST 15 02/27/2014 1333   AST 24 02/04/2014 0534   ALT 25 02/27/2014 1333   ALT 18 02/04/2014 0534   BILITOT <0.20 02/27/2014 1333   BILITOT 0.4 02/04/2014 0534       No results found for: LABCA2  No components found for: [id number]  No results for input(s): INR in the last 168 hours.  Urinalysis    Component Value Date/Time   COLORURINE YELLOW 01/31/2014 1050   APPEARANCEUR CLOUDY* 01/31/2014 1050   LABSPEC 1.019 01/31/2014 1050   PHURINE 6.0 01/31/2014 1050   GLUCOSEU NEGATIVE 01/31/2014 1050   HGBUR LARGE* 01/31/2014 1050   BILIRUBINUR NEGATIVE 01/31/2014 1050   KETONESUR NEGATIVE 01/31/2014 1050   PROTEINUR 100* 01/31/2014 1050   UROBILINOGEN 0.2 01/31/2014 1050   NITRITE NEGATIVE 01/31/2014 1050   LEUKOCYTESUR NEGATIVE 01/31/2014 1050    STUDIES: No results found.  ASSESSMENT: 39 y.o. 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;        I: CyBorD started January 2016  (a) dexamethasone 20 mg/d every TU/WED started 02/04/2014  (b) bortezomib sQ days 1,4,8,11 of every 21 day cycle started 02/10/2014  (c) cyclophosphamide 300 mg/M2 IV weekly started 02/13/2014  (2) chronic kidney disease stage III  (3) hypercalcemia: zolendronate started 02/10/2014-- repeat every 4 weeks initially, then every 12 weeks  (4) ID prophylaxis:  (a) influenza and Pneumovax [PV13] vaccines 02/07/2014  (b) acyclovir  started 02/07/2014  (c) HIV negative 01/31/2014  (d) Pneumovax P23 ro be given after March 2016  (5). The patient is not a transplant candidate for financial reasons   PLAN: Cordero is doing moderately well today. The labs were reviewed in detail and were stable. He will proceed with day 4, cycle 2 bortezomib and cyclophosphamide today as planned. I have written him a prescription for zofran to use at home, and explained how to use this drug to manage his nausea.  Coreyon will continue will continue with dexamethasone as advised on Tuesdays and Wednesday. He is next due for cyclophosphamide next Thursday and velcade again on 2/8 . His next office visit will be on 2/17 with Dr. Jana Hakim. He understands and agrees with this plan. He knows the goal of treatment in his case is remission. He has been encouraged to call with any issues that might arise before his next visit here.  Marcelino Duster, NP   03/06/2014 9:13 AM

## 2014-02-27 NOTE — Addendum Note (Signed)
Addended by: Marcelino Duster on: 02/27/2014 01:30 PM   Modules accepted: Orders

## 2014-02-28 ENCOUNTER — Ambulatory Visit (HOSPITAL_BASED_OUTPATIENT_CLINIC_OR_DEPARTMENT_OTHER): Payer: Self-pay

## 2014-02-28 VITALS — BP 126/73 | HR 80 | Temp 98.7°F | Resp 20

## 2014-02-28 DIAGNOSIS — D630 Anemia in neoplastic disease: Secondary | ICD-10-CM

## 2014-02-28 DIAGNOSIS — C9000 Multiple myeloma not having achieved remission: Secondary | ICD-10-CM

## 2014-02-28 LAB — PREPARE RBC (CROSSMATCH)

## 2014-02-28 MED ORDER — DIPHENHYDRAMINE HCL 25 MG PO CAPS
25.0000 mg | ORAL_CAPSULE | Freq: Once | ORAL | Status: AC
  Administered 2014-02-28: 25 mg via ORAL

## 2014-02-28 MED ORDER — ACETAMINOPHEN 325 MG PO TABS
650.0000 mg | ORAL_TABLET | Freq: Once | ORAL | Status: AC
  Administered 2014-02-28: 650 mg via ORAL

## 2014-02-28 MED ORDER — SODIUM CHLORIDE 0.9 % IV SOLN
250.0000 mL | Freq: Once | INTRAVENOUS | Status: AC
  Administered 2014-02-28: 250 mL via INTRAVENOUS

## 2014-02-28 MED ORDER — DIPHENHYDRAMINE HCL 25 MG PO CAPS
ORAL_CAPSULE | ORAL | Status: AC
  Filled 2014-02-28: qty 1

## 2014-02-28 MED ORDER — ACETAMINOPHEN 325 MG PO TABS
ORAL_TABLET | ORAL | Status: AC
  Filled 2014-02-28: qty 2

## 2014-02-28 NOTE — Patient Instructions (Signed)
Informacin sobre la transfusin de sangre (Blood Transfusion Information) QU ES UNA TRANSFUSIN DE SANGRE? Una transfusin [employer] de todos o algunos de los componentes de la [place]. La sangre est formada por mltiples clulas que cumplen diferentes funciones.  Los glbulos rojos llevan el oxgeno y se usan para reemplazar prdidas sanguneas.  Los glbulos blancos luchan contra las infecciones.  Las plaquetas [employer].  El plasma provee factores coagulantes.  Existen otros productos disponibles para necesidades especiales como la hemofilia u otros problemas de coagulacin. ANTES DE LA TRANSFUSIN Quin puede donar sangre para transfusiones?   Usted puede donar sangre para ser utilizada ms tarde en usted mismo ( esto se denomina donacin autloga).  Se puede solicitar a los familiares que [employer]. Esto, generalmente, no es ms seguro que si recibe sangre de un extrao.  Es segura la sangre de voluntarios sanos que hayan sido completamente evaluados. Esto es un banco de [place]. Una transfusin de sangre es ahora ms segura que nunca antes en la prctica de la [place]. Antes de obtener sangre de un donante, se le confecciona una historia completa para asegurarse de que esa persona no tenga antecedentes de enfermedades, ni est involucrado en conductas de riesgo (por ejemplo el uso de drogas intravenosas o actividad sexual con mltiples parejas). Se controlar el historial de viajes del donante para minimizar los riesgos de la transmisin de infecciones como la malaria. La sangre donada se analizar para descartar enfermedades infecciosas como VIH y hepatitis. Luego se probar para asegurarse de que es compatible con usted para minimizar las posibilidades de una reaccin a la transfusin. Si usted o un familiar suyo donan sangre, sto se realiza con anterioridad a Clementeen Hoof y no es adecuado en situaciones de Freight forwarder. El [place] de donacin [place]. RIESGOS Y COMPLICACIONES Aunque la terapia de transfusin es muy segura y salva muchas vidas, sus principales peligros incluyen:   El contacto con enfermedades infecciosas.  El desarrollo de una reaccin. Se trata de una reaccin alrgica a algn compuesto de la sangre que se recibi. Para prevenir esta situacin, se toman todas las medidas posibles. Estas decisiones son consideradas detenidamente por su mdico antes de la transfusin. No se realiza una transfusin de [place] a menos que los beneficios sean [employer]. DESPUS DEL PROCEDIMIENTO  Inmediatamente despus de recibir una transfusin, se sentir mucho mejor y con ms energa. Esto se percibe especialmente si usted estaba muy anmico. Significa que sus glbulos rojos haban disminuido. La transfusin eleva el nivel de los glbulos rojos que son los que transportan oxgeno y esto generalmente hace que la energa aumente.  Los enfermeros que hayan administrado la transfusin lo controlarn cuidadosamente ante cualquier complicacin. INSTRUCCIONES PARA EL CUIDADO DOMICILIARIO No existen instrucciones especiales para despus de una transfusin. Se sentir ms enrgico. Hable con el mdico acerca de cualquier limitacin sobre las actividades respecto de las enfermedades subyacentes que pueden aparecer como un ataque cardaco.  SOLICITE ATENCIN MDICA SI:  Su enfermedad no mejora luego de la transfusin.  Desarrolla enrojecimiento o irritacin en la zona donde se ha colocado la lnea intravenosa. SOLICITE ATENCIN MDICA DE INMEDIATO SI: Aparecen algunos de los siguientes sntomas en las 12 horas siguientes:  Siente escalofros.  Usted tiene una temperatura oral de ms de 38,9 C (102 F) y no puede controlarla con medicamentos.  Dolores de [place], pecho, o musculares.  Las personas que lo rodean creen que usted no acta correctamente o lo ven confundido.  Le falta la respiracin o presenta dificultades  respiratorias.  Mareos o sufre desmayos.  Desarrolla un sarpullido o urticaria.  Disminuye la cantidad [place].  La orina se torna de color oscuro o cambia a un color rosado, rojo o marrn. Aparecen algunos de los siguientes sntomas en los [address] posteriores a la transfusin:  Usted tiene una temperatura oral de ms de 38,9 C (102 F) y no puede controlarla con medicamentos.  Falta de aire.  Debilidad luego de las actividades normales.  Disminucin en la cantidad de [place], u orina con menos frecuencia.  La parte blanca del ojo se vuelve amarilla ([place]).  El color de la orina se vuelve rosado, rojo o [place]. Document Released: 01/17/2005 Document Revised: 04/11/2011 [facility] Patient Information 2015 [place]. This information is not intended to replace advice given to you by your health care provider. Make sure you discuss any questions you have with your health care provider.

## 2014-03-01 LAB — TYPE AND SCREEN
ABO/RH(D): O POS
Antibody Screen: NEGATIVE
Unit division: 0
Unit division: 0

## 2014-03-03 ENCOUNTER — Other Ambulatory Visit (HOSPITAL_BASED_OUTPATIENT_CLINIC_OR_DEPARTMENT_OTHER): Payer: Self-pay

## 2014-03-03 ENCOUNTER — Ambulatory Visit (HOSPITAL_BASED_OUTPATIENT_CLINIC_OR_DEPARTMENT_OTHER): Payer: Self-pay

## 2014-03-03 DIAGNOSIS — N183 Chronic kidney disease, stage 3 unspecified: Secondary | ICD-10-CM

## 2014-03-03 DIAGNOSIS — M899 Disorder of bone, unspecified: Secondary | ICD-10-CM

## 2014-03-03 DIAGNOSIS — C9000 Multiple myeloma not having achieved remission: Secondary | ICD-10-CM

## 2014-03-03 DIAGNOSIS — D630 Anemia in neoplastic disease: Secondary | ICD-10-CM

## 2014-03-03 DIAGNOSIS — Z5112 Encounter for antineoplastic immunotherapy: Secondary | ICD-10-CM

## 2014-03-03 LAB — CBC WITH DIFFERENTIAL/PLATELET
BASO%: 0.4 % (ref 0.0–2.0)
BASOS ABS: 0 10*3/uL (ref 0.0–0.1)
EOS ABS: 0.1 10*3/uL (ref 0.0–0.5)
EOS%: 0.6 % (ref 0.0–7.0)
HCT: 35 % — ABNORMAL LOW (ref 38.4–49.9)
HGB: 11.4 g/dL — ABNORMAL LOW (ref 13.0–17.1)
LYMPH#: 1.5 10*3/uL (ref 0.9–3.3)
LYMPH%: 16.8 % (ref 14.0–49.0)
MCH: 30.2 pg (ref 27.2–33.4)
MCHC: 32.5 g/dL (ref 32.0–36.0)
MCV: 92.9 fL (ref 79.3–98.0)
MONO#: 0.7 10*3/uL (ref 0.1–0.9)
MONO%: 8.1 % (ref 0.0–14.0)
NEUT#: 6.6 10*3/uL — ABNORMAL HIGH (ref 1.5–6.5)
NEUT%: 74.1 % (ref 39.0–75.0)
Platelets: 506 10*3/uL — ABNORMAL HIGH (ref 140–400)
RBC: 3.77 10*6/uL — ABNORMAL LOW (ref 4.20–5.82)
RDW: 13.4 % (ref 11.0–14.6)
WBC: 8.9 10*3/uL (ref 4.0–10.3)

## 2014-03-03 MED ORDER — ONDANSETRON HCL 8 MG PO TABS
8.0000 mg | ORAL_TABLET | Freq: Once | ORAL | Status: AC
  Administered 2014-03-03: 8 mg via ORAL

## 2014-03-03 MED ORDER — BORTEZOMIB CHEMO SQ INJECTION 3.5 MG (2.5MG/ML)
1.3000 mg/m2 | Freq: Once | INTRAMUSCULAR | Status: AC
  Administered 2014-03-03: 2.25 mg via SUBCUTANEOUS
  Filled 2014-03-03: qty 2.25

## 2014-03-03 MED ORDER — ONDANSETRON HCL 8 MG PO TABS
ORAL_TABLET | ORAL | Status: AC
  Filled 2014-03-03: qty 1

## 2014-03-06 ENCOUNTER — Ambulatory Visit (HOSPITAL_BASED_OUTPATIENT_CLINIC_OR_DEPARTMENT_OTHER): Payer: Self-pay

## 2014-03-06 ENCOUNTER — Other Ambulatory Visit (HOSPITAL_BASED_OUTPATIENT_CLINIC_OR_DEPARTMENT_OTHER): Payer: Self-pay

## 2014-03-06 ENCOUNTER — Encounter: Payer: Self-pay | Admitting: Nurse Practitioner

## 2014-03-06 ENCOUNTER — Ambulatory Visit (HOSPITAL_BASED_OUTPATIENT_CLINIC_OR_DEPARTMENT_OTHER): Payer: Self-pay | Admitting: Nurse Practitioner

## 2014-03-06 VITALS — BP 124/76 | HR 63 | Temp 98.5°F | Resp 18 | Ht 62.0 in | Wt 151.8 lb

## 2014-03-06 DIAGNOSIS — M899 Disorder of bone, unspecified: Secondary | ICD-10-CM

## 2014-03-06 DIAGNOSIS — C9000 Multiple myeloma not having achieved remission: Secondary | ICD-10-CM

## 2014-03-06 DIAGNOSIS — C183 Malignant neoplasm of hepatic flexure: Secondary | ICD-10-CM

## 2014-03-06 DIAGNOSIS — M898X9 Other specified disorders of bone, unspecified site: Secondary | ICD-10-CM

## 2014-03-06 DIAGNOSIS — N183 Chronic kidney disease, stage 3 unspecified: Secondary | ICD-10-CM

## 2014-03-06 DIAGNOSIS — Z5112 Encounter for antineoplastic immunotherapy: Secondary | ICD-10-CM

## 2014-03-06 DIAGNOSIS — Z5111 Encounter for antineoplastic chemotherapy: Secondary | ICD-10-CM

## 2014-03-06 DIAGNOSIS — D630 Anemia in neoplastic disease: Secondary | ICD-10-CM

## 2014-03-06 LAB — COMPREHENSIVE METABOLIC PANEL (CC13)
ALT: 21 U/L (ref 0–55)
AST: 16 U/L (ref 5–34)
Albumin: 2.1 g/dL — ABNORMAL LOW (ref 3.5–5.0)
Alkaline Phosphatase: 144 U/L (ref 40–150)
Anion Gap: 9 mEq/L (ref 3–11)
BUN: 23.5 mg/dL (ref 7.0–26.0)
CALCIUM: 7.7 mg/dL — AB (ref 8.4–10.4)
CO2: 21 mEq/L — ABNORMAL LOW (ref 22–29)
Chloride: 111 mEq/L — ABNORMAL HIGH (ref 98–109)
Creatinine: 1.3 mg/dL (ref 0.7–1.3)
EGFR: 68 mL/min/{1.73_m2} — ABNORMAL LOW (ref >90)
Glucose: 80 mg/dl (ref 70–140)
Potassium: 3.7 mEq/L (ref 3.5–5.1)
Sodium: 141 mEq/L (ref 136–145)
Total Bilirubin: <0.2 mg/dL (ref 0.20–1.20)
Total Protein: 6.2 g/dL — ABNORMAL LOW (ref 6.4–8.3)

## 2014-03-06 LAB — CBC WITH DIFFERENTIAL/PLATELET
BASO%: 0 % (ref 0.0–2.0)
Basophils Absolute: 0 10*3/uL (ref 0.0–0.1)
EOS%: 0.1 % (ref 0.0–7.0)
Eosinophils Absolute: 0 10*3/uL (ref 0.0–0.5)
HCT: 34.3 % — ABNORMAL LOW (ref 38.4–49.9)
HGB: 11.2 g/dL — ABNORMAL LOW (ref 13.0–17.1)
LYMPH%: 20.6 % (ref 14.0–49.0)
MCH: 31 pg (ref 27.2–33.4)
MCHC: 32.7 g/dL (ref 32.0–36.0)
MCV: 95 fL (ref 79.3–98.0)
MONO#: 1.2 10*3/uL — ABNORMAL HIGH (ref 0.1–0.9)
MONO%: 11.7 % (ref 0.0–14.0)
NEUT#: 7 10*3/uL — ABNORMAL HIGH (ref 1.5–6.5)
NEUT%: 67.6 % (ref 39.0–75.0)
PLATELETS: 508 10*3/uL — AB (ref 140–400)
RBC: 3.61 10*6/uL — ABNORMAL LOW (ref 4.20–5.82)
RDW: 14 % (ref 11.0–14.6)
WBC: 10.4 10*3/uL — ABNORMAL HIGH (ref 4.0–10.3)
lymph#: 2.1 10*3/uL (ref 0.9–3.3)

## 2014-03-06 LAB — LACTATE DEHYDROGENASE (CC13): LDH: 180 U/L (ref 125–245)

## 2014-03-06 LAB — PROTEIN / CREATININE RATIO, URINE
Creatinine, Urine: 102.8 mg/dL
Protein Creatinine Ratio: 0.49 — ABNORMAL HIGH (ref <0.15)
Total Protein, Urine: 50 mg/dL — ABNORMAL HIGH (ref 5–25)

## 2014-03-06 MED ORDER — ONDANSETRON HCL 8 MG PO TABS: 8.0000 mg | ORAL_TABLET | Freq: Two times a day (BID) | ORAL | Status: DC

## 2014-03-06 MED ORDER — SODIUM CHLORIDE 0.9 % IV SOLN
Freq: Once | INTRAVENOUS | Status: AC
  Administered 2014-03-06: 10:00:00 via INTRAVENOUS

## 2014-03-06 MED ORDER — ONDANSETRON HCL 8 MG PO TABS
8.0000 mg | ORAL_TABLET | Freq: Once | ORAL | Status: AC
  Administered 2014-03-06: 8 mg via ORAL

## 2014-03-06 MED ORDER — ZOLEDRONIC ACID 4 MG/5ML IV CONC: 4.0000 mg | Freq: Once | INTRAVENOUS | Status: DC

## 2014-03-06 MED ORDER — BORTEZOMIB CHEMO SQ INJECTION 3.5 MG (2.5MG/ML)
1.3000 mg/m2 | Freq: Once | INTRAMUSCULAR | Status: AC
  Administered 2014-03-06: 2.25 mg via SUBCUTANEOUS
  Filled 2014-03-06: qty 2.25

## 2014-03-06 MED ORDER — CYCLOPHOSPHAMIDE CHEMO INJECTION 1 GM
300.0000 mg/m2 | Freq: Once | INTRAMUSCULAR | Status: AC
  Administered 2014-03-06: 520 mg via INTRAVENOUS
  Filled 2014-03-06: qty 26

## 2014-03-06 MED ORDER — ONDANSETRON HCL 8 MG PO TABS
ORAL_TABLET | ORAL | Status: AC
  Filled 2014-03-06: qty 1

## 2014-03-06 NOTE — Patient Instructions (Signed)
[facility] Discharge Instructions for Patients Receiving Chemotherapy  Today you received the following chemotherapy agents Velcade/Cytoxan To help prevent nausea and vomiting after your treatment, we encourage you to take your nausea medication as prescribed.  If you develop nausea and vomiting that is not controlled by your nausea medication, call the clinic.   BELOW ARE SYMPTOMS THAT SHOULD BE REPORTED IMMEDIATELY:  *FEVER GREATER THAN 100.5 F  *CHILLS WITH OR WITHOUT FEVER  NAUSEA AND VOMITING THAT IS NOT CONTROLLED WITH YOUR NAUSEA MEDICATION  *UNUSUAL SHORTNESS OF BREATH  *UNUSUAL BRUISING OR BLEEDING  TENDERNESS IN MOUTH AND THROAT WITH OR WITHOUT PRESENCE OF ULCERS  *URINARY PROBLEMS  *BOWEL PROBLEMS  UNUSUAL RASH Items with * indicate a potential emergency and should be followed up as soon as possible.  Feel free to call the clinic you have any questions or concerns. The clinic phone number is [phone].    Zoledronic Acid injection (Hypercalcemia, Oncology) What is this medicine? ZOLEDRONIC ACID (ZOE le dron ik AS id) lowers the amount of calcium loss from bone. It is used to treat too much calcium in your blood from cancer. It is also used to prevent complications of cancer that has spread to the bone. This medicine may be used for other purposes; ask your health care provider or pharmacist if you have questions. COMMON BRAND NAME(S): Zometa What should I tell my health care provider before I take this medicine? They need to know if you have any of these conditions: -aspirin-sensitive asthma -cancer, especially if you are receiving medicines used to treat cancer -dental disease or wear dentures -infection -kidney disease -receiving corticosteroids like dexamethasone or prednisone -an unusual or allergic reaction to zoledronic acid, other medicines, foods, dyes, or preservatives -pregnant or trying to get pregnant -breast-feeding How  should I use this medicine? This medicine is for infusion into a vein. It is given by a health care professional in a hospital or clinic setting. Talk to your pediatrician regarding the use of this medicine in children. Special care may be needed. Overdosage: If you think you have taken too much of this medicine contact a poison control center or emergency room at once. NOTE: This medicine is only for you. Do not share this medicine with others. What if I miss a dose? It is important not to miss your dose. Call your doctor or health care professional if you are unable to keep an appointment. What may interact with this medicine? -certain antibiotics given by injection -NSAIDs, medicines for pain and inflammation, like ibuprofen or naproxen -some diuretics like bumetanide, furosemide -teriparatide -thalidomide This list may not describe all possible interactions. Give your health care provider a list of all the medicines, herbs, non-prescription drugs, or dietary supplements you use. Also tell them if you smoke, drink alcohol, or use illegal drugs. Some items may interact with your medicine. What should I watch for while using this medicine? Visit your doctor or health care professional for regular checkups. It may be some time before you see the benefit from this medicine. Do not stop taking your medicine unless your doctor tells you to. Your doctor may order blood tests or other tests to see how you are doing. Women should inform their doctor if they wish to become pregnant or think they might be pregnant. There is a potential for serious side effects to an unborn child. Talk to your health care professional or pharmacist for more information. You should make sure that you get enough  calcium and vitamin D while you are taking this medicine. Discuss the foods you eat and the vitamins you take with your health care professional. Some people who take this medicine have severe bone, joint, and/or  muscle pain. This medicine may also increase your risk for jaw problems or a broken thigh bone. Tell your doctor right away if you have severe pain in your jaw, bones, joints, or muscles. Tell your doctor if you have any pain that does not go away or that gets worse. Tell your dentist and dental surgeon that you are taking this medicine. You should not have major dental surgery while on this medicine. See your dentist to have a dental exam and fix any dental problems before starting this medicine. Take good care of your teeth while on this medicine. Make sure you see your dentist for regular follow-up appointments. What side effects may I notice from receiving this medicine? Side effects that you should report to your doctor or health care professional as soon as possible: -allergic reactions like skin rash, itching or hives, swelling of the face, lips, or tongue -anxiety, confusion, or depression -breathing problems -changes in vision -eye pain -feeling faint or lightheaded, falls -jaw pain, especially after dental work -mouth sores -muscle cramps, stiffness, or weakness -trouble passing urine or change in the amount of urine Side effects that usually do not require medical attention (report to your doctor or health care professional if they continue or are bothersome): -bone, joint, or muscle pain -constipation -diarrhea -fever -hair loss -irritation at site where injected -loss of appetite -nausea, vomiting -stomach upset -trouble sleeping -trouble swallowing -weak or tired This list may not describe all possible side effects. Call your doctor for medical advice about side effects. You may report side effects to FDA at 1-800-FDA-1088. Where should I keep my medicine? This drug is given in a hospital or clinic and will not be stored at home. NOTE: This sheet is a summary. It may not cover all possible information. If you have questions about this medicine, talk to your doctor,  pharmacist, or health care provider.  2015, Elsevier/Gold Standard. (2012-06-28 13:03:13)

## 2014-03-10 ENCOUNTER — Other Ambulatory Visit: Payer: Self-pay

## 2014-03-10 ENCOUNTER — Ambulatory Visit (HOSPITAL_BASED_OUTPATIENT_CLINIC_OR_DEPARTMENT_OTHER): Payer: Self-pay

## 2014-03-10 ENCOUNTER — Telehealth: Payer: Self-pay

## 2014-03-10 DIAGNOSIS — Z5112 Encounter for antineoplastic immunotherapy: Secondary | ICD-10-CM

## 2014-03-10 DIAGNOSIS — M899 Disorder of bone, unspecified: Secondary | ICD-10-CM

## 2014-03-10 DIAGNOSIS — C9000 Multiple myeloma not having achieved remission: Secondary | ICD-10-CM

## 2014-03-10 DIAGNOSIS — M898X9 Other specified disorders of bone, unspecified site: Secondary | ICD-10-CM

## 2014-03-10 LAB — KAPPA/LAMBDA LIGHT CHAINS
KAPPA FREE LGHT CHN: 4.9 mg/dL — AB (ref 0.33–1.94)
KAPPA LAMBDA RATIO: 2.61 — AB (ref 0.26–1.65)
LAMBDA FREE LGHT CHN: 1.88 mg/dL (ref 0.57–2.63)

## 2014-03-10 LAB — PROTEIN ELECTROPHORESIS, SERUM
ALPHA-2-GLOBULIN: 26.2 % — AB (ref 7.1–11.8)
Albumin ELP: 40.3 % — ABNORMAL LOW (ref 55.8–66.1)
Alpha-1-Globulin: 7.8 % — ABNORMAL HIGH (ref 2.9–4.9)
Beta 2: 3.5 % (ref 3.2–6.5)
Beta Globulin: 5.8 % (ref 4.7–7.2)
Gamma Globulin: 16.4 % (ref 11.1–18.8)
M-SPIKE, %: 0.73 g/dL
TOTAL PROTEIN, SERUM ELECTROPHOR: 6 g/dL (ref 6.0–8.3)

## 2014-03-10 LAB — BETA 2 MICROGLOBULIN, SERUM: Beta-2 Microglobulin: 5.32 mg/L — ABNORMAL HIGH (ref <=2.51)

## 2014-03-10 MED ORDER — ONDANSETRON HCL 8 MG PO TABS
8.0000 mg | ORAL_TABLET | Freq: Once | ORAL | Status: AC
  Administered 2014-03-10: 8 mg via ORAL

## 2014-03-10 MED ORDER — BORTEZOMIB CHEMO SQ INJECTION 3.5 MG (2.5MG/ML)
1.3000 mg/m2 | Freq: Once | INTRAMUSCULAR | Status: AC
  Administered 2014-03-10: 2.25 mg via SUBCUTANEOUS
  Filled 2014-03-10: qty 2.25

## 2014-03-10 MED ORDER — ONDANSETRON HCL 8 MG PO TABS
ORAL_TABLET | ORAL | Status: AC
  Filled 2014-03-10: qty 1

## 2014-03-10 NOTE — Telephone Encounter (Signed)
Charles [name] with lab called re: orders for todays labs.  Chart review labs to be drawn Q21 days, CBC q7 days.  All labs were drawn 03/06/14.  Lab appt for today cancelled.  Pt notified no lab today - pt to arrive at 145 for 2 pm infusion appt.  Pt voiced understanding.  Lab notified.

## 2014-03-10 NOTE — Patient Instructions (Signed)
[facility] Discharge Instructions for Patients Receiving Chemotherapy  Today you received the following chemotherapy agents: Velcade.  To help prevent nausea and vomiting after your treatment, we encourage you to take your nausea medication as prescribed.   If you develop nausea and vomiting that is not controlled by your nausea medication, call the clinic.   BELOW ARE SYMPTOMS THAT SHOULD BE REPORTED IMMEDIATELY:  *FEVER GREATER THAN 100.5 F  *CHILLS WITH OR WITHOUT FEVER  NAUSEA AND VOMITING THAT IS NOT CONTROLLED WITH YOUR NAUSEA MEDICATION  *UNUSUAL SHORTNESS OF BREATH  *UNUSUAL BRUISING OR BLEEDING  TENDERNESS IN MOUTH AND THROAT WITH OR WITHOUT PRESENCE OF ULCERS  *URINARY PROBLEMS  *BOWEL PROBLEMS  UNUSUAL RASH Items with * indicate a potential emergency and should be followed up as soon as possible.  Feel free to call the clinic you have any questions or concerns. The clinic phone number is [phone].

## 2014-03-13 ENCOUNTER — Other Ambulatory Visit: Payer: Self-pay | Admitting: *Deleted

## 2014-03-13 ENCOUNTER — Ambulatory Visit (HOSPITAL_BASED_OUTPATIENT_CLINIC_OR_DEPARTMENT_OTHER): Payer: Self-pay

## 2014-03-13 ENCOUNTER — Other Ambulatory Visit (HOSPITAL_BASED_OUTPATIENT_CLINIC_OR_DEPARTMENT_OTHER): Payer: Self-pay

## 2014-03-13 DIAGNOSIS — N183 Chronic kidney disease, stage 3 unspecified: Secondary | ICD-10-CM

## 2014-03-13 DIAGNOSIS — C9000 Multiple myeloma not having achieved remission: Secondary | ICD-10-CM

## 2014-03-13 DIAGNOSIS — M899 Disorder of bone, unspecified: Secondary | ICD-10-CM

## 2014-03-13 DIAGNOSIS — D630 Anemia in neoplastic disease: Secondary | ICD-10-CM

## 2014-03-13 DIAGNOSIS — M898X9 Other specified disorders of bone, unspecified site: Secondary | ICD-10-CM

## 2014-03-13 DIAGNOSIS — Z5111 Encounter for antineoplastic chemotherapy: Secondary | ICD-10-CM

## 2014-03-13 DIAGNOSIS — Z5112 Encounter for antineoplastic immunotherapy: Secondary | ICD-10-CM

## 2014-03-13 LAB — COMPREHENSIVE METABOLIC PANEL (CC13)
ALT: 23 U/L (ref 0–55)
AST: 17 U/L (ref 5–34)
Albumin: 2.3 g/dL — ABNORMAL LOW (ref 3.5–5.0)
Alkaline Phosphatase: 163 U/L — ABNORMAL HIGH (ref 40–150)
Anion Gap: 12 mEq/L — ABNORMAL HIGH (ref 3–11)
BUN: 24.1 mg/dL (ref 7.0–26.0)
CO2: 21 meq/L — AB (ref 22–29)
Calcium: 8.3 mg/dL — ABNORMAL LOW (ref 8.4–10.4)
Chloride: 110 mEq/L — ABNORMAL HIGH (ref 98–109)
Creatinine: 1.3 mg/dL (ref 0.7–1.3)
EGFR: 70 mL/min/{1.73_m2} — ABNORMAL LOW (ref >90)
Glucose: 109 mg/dl (ref 70–140)
Potassium: 3.6 mEq/L (ref 3.5–5.1)
SODIUM: 144 meq/L (ref 136–145)
TOTAL PROTEIN: 5.9 g/dL — AB (ref 6.4–8.3)
Total Bilirubin: <0.2 mg/dL (ref 0.20–1.20)

## 2014-03-13 LAB — CBC WITH DIFFERENTIAL/PLATELET
BASO%: 0.2 % (ref 0.0–2.0)
Basophils Absolute: 0 10*3/uL (ref 0.0–0.1)
EOS%: 0 % (ref 0.0–7.0)
Eosinophils Absolute: 0 10*3/uL (ref 0.0–0.5)
HEMATOCRIT: 34.7 % — AB (ref 38.4–49.9)
HEMOGLOBIN: 11.2 g/dL — AB (ref 13.0–17.1)
LYMPH%: 14.5 % (ref 14.0–49.0)
MCH: 30.2 pg (ref 27.2–33.4)
MCHC: 32.2 g/dL (ref 32.0–36.0)
MCV: 93.8 fL (ref 79.3–98.0)
MONO#: 1.5 10*3/uL — ABNORMAL HIGH (ref 0.1–0.9)
MONO%: 13.4 % (ref 0.0–14.0)
NEUT%: 71.9 % (ref 39.0–75.0)
NEUTROS ABS: 7.9 10*3/uL — AB (ref 1.5–6.5)
Platelets: 416 10*3/uL — ABNORMAL HIGH (ref 140–400)
RBC: 3.7 10*6/uL — ABNORMAL LOW (ref 4.20–5.82)
RDW: 14.2 % (ref 11.0–14.6)
WBC: 11 10*3/uL — ABNORMAL HIGH (ref 4.0–10.3)
lymph#: 1.6 10*3/uL (ref 0.9–3.3)

## 2014-03-13 MED ORDER — SODIUM CHLORIDE 0.9 % IV SOLN
Freq: Once | INTRAVENOUS | Status: AC
  Administered 2014-03-13: 15:00:00 via INTRAVENOUS

## 2014-03-13 MED ORDER — ONDANSETRON HCL 8 MG PO TABS
8.0000 mg | ORAL_TABLET | Freq: Once | ORAL | Status: AC
  Administered 2014-03-13: 8 mg via ORAL

## 2014-03-13 MED ORDER — BORTEZOMIB CHEMO SQ INJECTION 3.5 MG (2.5MG/ML)
1.3000 mg/m2 | Freq: Once | INTRAMUSCULAR | Status: AC
  Administered 2014-03-13: 2.25 mg via SUBCUTANEOUS
  Filled 2014-03-13: qty 2.25

## 2014-03-13 MED ORDER — ONDANSETRON HCL 8 MG PO TABS
ORAL_TABLET | ORAL | Status: AC
  Filled 2014-03-13: qty 1

## 2014-03-13 MED ORDER — SODIUM CHLORIDE 0.9 % IV SOLN
300.0000 mg/m2 | Freq: Once | INTRAVENOUS | Status: AC
  Administered 2014-03-13: 520 mg via INTRAVENOUS
  Filled 2014-03-13: qty 26

## 2014-03-13 NOTE — Patient Instructions (Signed)
[facility] Discharge Instructions for Patients Receiving Chemotherapy  Today you received the following chemotherapy agents: Velcade and Cytoxan   To help prevent nausea and vomiting after your treatment, we encourage you to take your nausea medication as prescribed.    If you develop nausea and vomiting that is not controlled by your nausea medication, call the clinic.   BELOW ARE SYMPTOMS THAT SHOULD BE REPORTED IMMEDIATELY:  *FEVER GREATER THAN 100.5 F  *CHILLS WITH OR WITHOUT FEVER  NAUSEA AND VOMITING THAT IS NOT CONTROLLED WITH YOUR NAUSEA MEDICATION  *UNUSUAL SHORTNESS OF BREATH  *UNUSUAL BRUISING OR BLEEDING  TENDERNESS IN MOUTH AND THROAT WITH OR WITHOUT PRESENCE OF ULCERS  *URINARY PROBLEMS  *BOWEL PROBLEMS  UNUSUAL RASH Items with * indicate a potential emergency and should be followed up as soon as possible.  Feel free to call the clinic you have any questions or concerns. The clinic phone number is [phone].

## 2014-03-16 ENCOUNTER — Other Ambulatory Visit: Payer: Self-pay | Admitting: Oncology

## 2014-03-16 NOTE — Progress Notes (Unsigned)
Cytogenetics on Charles [name]'s 02/03/2014 biopsy show 1 every abnormalities, deletion 13, as well as other abnormalities less clearly associated with disease prognosis. The 2 mentioned above place this case in the intermediate risk category, and the standard treatment for this would be aggressive therapy to enter remission followed by autologous transplant, which however cannot be performed in this case for economic reasons. Accordingly once the patient enters remission we will start maintenance therapy with a protease some inhibitor

## 2014-03-20 ENCOUNTER — Telehealth: Payer: Self-pay | Admitting: Oncology

## 2014-03-20 ENCOUNTER — Ambulatory Visit: Payer: Self-pay | Admitting: Oncology

## 2014-03-20 ENCOUNTER — Ambulatory Visit (HOSPITAL_BASED_OUTPATIENT_CLINIC_OR_DEPARTMENT_OTHER): Payer: Self-pay

## 2014-03-20 ENCOUNTER — Ambulatory Visit (HOSPITAL_BASED_OUTPATIENT_CLINIC_OR_DEPARTMENT_OTHER): Payer: Self-pay | Admitting: Oncology

## 2014-03-20 ENCOUNTER — Other Ambulatory Visit: Payer: Self-pay

## 2014-03-20 VITALS — BP 151/78 | HR 67 | Temp 98.3°F | Resp 18 | Ht 62.0 in | Wt 149.3 lb

## 2014-03-20 DIAGNOSIS — C9000 Multiple myeloma not having achieved remission: Secondary | ICD-10-CM

## 2014-03-20 DIAGNOSIS — N183 Chronic kidney disease, stage 3 unspecified: Secondary | ICD-10-CM

## 2014-03-20 DIAGNOSIS — M898X9 Other specified disorders of bone, unspecified site: Secondary | ICD-10-CM

## 2014-03-20 DIAGNOSIS — M899 Disorder of bone, unspecified: Secondary | ICD-10-CM

## 2014-03-20 DIAGNOSIS — D630 Anemia in neoplastic disease: Secondary | ICD-10-CM

## 2014-03-20 DIAGNOSIS — Z5111 Encounter for antineoplastic chemotherapy: Secondary | ICD-10-CM

## 2014-03-20 LAB — CBC WITH DIFFERENTIAL/PLATELET
BASO%: 0.1 % (ref 0.0–2.0)
BASOS ABS: 0 10*3/uL (ref 0.0–0.1)
EOS%: 0.1 % (ref 0.0–7.0)
Eosinophils Absolute: 0 10*3/uL (ref 0.0–0.5)
HEMATOCRIT: 31.1 % — AB (ref 38.4–49.9)
HEMOGLOBIN: 10.3 g/dL — AB (ref 13.0–17.1)
LYMPH#: 1.8 10*3/uL (ref 0.9–3.3)
LYMPH%: 21.4 % (ref 14.0–49.0)
MCH: 30.9 pg (ref 27.2–33.4)
MCHC: 33.1 g/dL (ref 32.0–36.0)
MCV: 93.4 fL (ref 79.3–98.0)
MONO#: 1 10*3/uL — ABNORMAL HIGH (ref 0.1–0.9)
MONO%: 12.2 % (ref 0.0–14.0)
NEUT#: 5.6 10*3/uL (ref 1.5–6.5)
NEUT%: 66.2 % (ref 39.0–75.0)
PLATELETS: 498 10*3/uL — AB (ref 140–400)
RBC: 3.33 10*6/uL — ABNORMAL LOW (ref 4.20–5.82)
RDW: 14.5 % (ref 11.0–14.6)
WBC: 8.4 10*3/uL (ref 4.0–10.3)
nRBC: 0 % (ref 0–0)

## 2014-03-20 LAB — COMPREHENSIVE METABOLIC PANEL (CC13)
ALT: 18 U/L (ref 0–55)
ANION GAP: 8 meq/L (ref 3–11)
AST: 16 U/L (ref 5–34)
Albumin: 2.5 g/dL — ABNORMAL LOW (ref 3.5–5.0)
Alkaline Phosphatase: 144 U/L (ref 40–150)
BUN: 22.7 mg/dL (ref 7.0–26.0)
CALCIUM: 8.2 mg/dL — AB (ref 8.4–10.4)
CHLORIDE: 110 meq/L — AB (ref 98–109)
CO2: 25 meq/L (ref 22–29)
CREATININE: 1.1 mg/dL (ref 0.7–1.3)
EGFR: 89 mL/min/{1.73_m2} — AB (ref >90)
GLUCOSE: 104 mg/dL (ref 70–140)
Potassium: 3.5 mEq/L (ref 3.5–5.1)
Sodium: 144 mEq/L (ref 136–145)
Total Bilirubin: <0.2 mg/dL (ref 0.20–1.20)
Total Protein: 5.7 g/dL — ABNORMAL LOW (ref 6.4–8.3)

## 2014-03-20 MED ORDER — SODIUM CHLORIDE 0.9 % IV SOLN
300.0000 mg/m2 | Freq: Once | INTRAVENOUS | Status: AC
  Administered 2014-03-20: 520 mg via INTRAVENOUS
  Filled 2014-03-20: qty 26

## 2014-03-20 NOTE — Telephone Encounter (Signed)
per pof to sch pt appt-sch-pt has updated copy of sch-per acct pt needs no interperer

## 2014-03-20 NOTE — Progress Notes (Signed)
[place]  [phone] [phone]) [phone]     ID: [name] DOB: [birth year]  MR#: [id number]  BDH#:[id number]  Patient Care Team: Tresa Garter, MD as PCP - General (Internal Medicine) PCP: [name], MD GYN: SU:  OTHER MD:  CHIEF COMPLAINT: multiple myeloma  CURRENT TREATMENT: bortezomib, cyclophosphamide, dexamethasone, zolendronate   HISTORY  OF MULTIPLE MYELOMA: From the original intake note January 2016:  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 The pain was worse with coughing. Exam was unremarkable, and he was treated with Mobic. No labs were obtained. On 01/30/2014 he presented to the emergency room at [facility] with similar complaints and also reported a 10 pound weight loss over the past month. A chest x-ray was significant only for mild atelectasis, but ACT NGO of the chest 01/30/2014, while it showed no pulmonary emboli, showed an osteolytic lesion destroying the manubrium of the sternum. There was extra osseous extension into the anterior mediastinum.  The patient was admitted and found to have a creatinine of 1.64 with a GFR of 51. Bilateral renal ultrasound showed no hydronephrosis or masses, and normal size and cortical thickness of both kidneys, though there was mild diffuse echogenicity. Urine total protein was 145 mg/dL and immunofixation showed a monoclonal IgG kappa protein as well as monoclonal free kappa light chains. On 01/31/2014 serum protein electrophoresis showed an M spike of 2.22 g/dL with a total protein of 8.3 and albumin 3.4. Kappa lambda light chains from the serum obtained 02/04/2014 showed 10.50 mg/dL on the, 1.74 in the lambda, with an elevated ratio at 6.03. Beta-2 microglobulin was 7.36. LDH was normal at 184 and HIV antibody was nonreactive.  Biopsy of  the manubrial mass 02/03/2014 showed extensive infiltration of the bone by sheets of atypical plasma cells, positive for CD138, kappa restricted (SZA 16-23). Right iliac bone marrow biopsy [name] 05/21/2014 (FZB 16-1) showed an average of 12% plasma cells with numerous aggregates and sheets of atypical plasma cells, which were kappa restricted. Cytogenetic analysis is pending  The patient's subsequent history is as detailed below  INTERVAL HISTORY: Mr.Jose-Gonzalez returns today for follow-up of his multiple myeloma. Today is day 18 cycle 2 of his  Bortezomib/ dexamethasone/ cyclophosphamide treatments, with bortezomib given on days 1, 4, 8, and 11 of every 21 day cycle, dexamethasone 20 mg taken every Tuesday and Wednesday, and cyclophosphamide infused weekly (on Thursdays). He gets some fatigue with each treatment, and has not been able to get back to work since November. He gets insomnia from the dexamethasone. Aside from that he is tolerating treatment well and in particular there has been no evidence of peripheral neuropathy to date.  REVIEW OF SYSTEMS: His stomach has been a little bit unsettled, but he denies constipation or diarrhea. There has not been any fever or rash or bleeding to his knowledge. Sometimes his urine looks a little bit darker. A detailed review of systems today was otherwise stable  PAST MEDICAL HISTORY: Past Medical History  Diagnosis Date  . IgG myeloma   . CKD (chronic kidney disease), stage III   . Hypercholesterolemia   . Lytic bone lesions on xray     PAST SURGICAL HISTORY: No past surgical history on file.  FAMILY HISTORY No family history on file. The patient's parents are still living, in their late 48's. The patient  has 2 brothers, 3 sisters. There is noc cancer in the fmaily to his knowledge.  SOCIAL HISTORY:  Originally from [facility], moved here 19 years ago. He works in Nurse, adult. Married (wife's name is [name]), lives in  [place]. He has 5 children ages 46, 38, 47, 83 and 36 months [name], [name], [name] and [place]) in good health. Best number to call him is [phone]    ADVANCED DIRECTIVES: not  In place   HEALTH MAINTENANCE: History  Substance Use Topics  . Smoking status: Never Smoker   . Smokeless tobacco: Never Used  . Alcohol Use: No     Colonoscopy:  PSA:  Bone density:  Lipid panel:  No Known Allergies  Current Outpatient Prescriptions  Medication Sig Dispense Refill  . acetaminophen (TYLENOL) 325 MG tablet Take 2 tablets (650 mg total) by mouth every 6 (six) hours as needed for mild pain (or Fever >/= 101). (Patient not taking: Reported on 02/20/2014)    . acyclovir (ZOVIRAX) 400 MG tablet Take 1 tablet (400 mg total) by mouth 5 (five) times daily. 60 tablet 6  . dexamethasone (DECADRON) 4 MG tablet Tome 5 pastillas al desayuno cada martes y miercoles (take 5 tablets w breakfast every Tuesday and wes) 50 tablet 12  . ondansetron (ZOFRAN) 8 MG tablet Take 1 tablet (8 mg total) by mouth 2 (two) times daily. 20 tablet 0  . oxyCODONE (OXY IR/ROXICODONE) 5 MG immediate release tablet Take 1 tablet (5 mg total) by mouth every 6 (six) hours as needed for moderate pain. (Patient not taking: Reported on 02/20/2014) 20 tablet 0   No current facility-administered medications for this visit.    OBJECTIVE: [name] 39 Vitals:   03/20/14 1423  BP: 151/78  Pulse: 67  Temp: 98.3 F (36.8 C)  Resp: 18     Body mass index is 27.3 kg/(m^2).    ECOG FS:1 - Symptomatic but completely ambulatory  Sclerae unicteric, pupils equal and reactive Oropharynx clear and moist-- no thrush or other lesions No cervical or supraclavicular adenopathy Lungs no rales or rhonchi Heart regular rate and rhythm Abd soft, nontender, positive bowel sounds MSK no focal spinal tenderness, no upper extremity lymphedema Neuro: nonfocal, well oriented, appropriate  affect   LAB RESULTS:  CMP     Component Value Date/Time   NA 144 03/13/2014 1402   NA 135 02/04/2014 0534   K 3.6 03/13/2014 1402   K 4.0 02/04/2014 0534   CL 103 02/04/2014 0534   CO2 21* 03/13/2014 1402   CO2 26 02/04/2014 0534   GLUCOSE 109 03/13/2014 1402   GLUCOSE 115* 02/04/2014 0534   BUN 24.1 03/13/2014 1402   BUN 20 02/04/2014 0534   CREATININE 1.3 03/13/2014 1402   CREATININE 1.75* 02/04/2014 0534   CALCIUM 8.3* 03/13/2014 1402   CALCIUM 10.6* 02/04/2014 0534   PROT 5.9* 03/13/2014 1402   PROT 7.5 02/04/2014 0534   ALBUMIN 2.3* 03/13/2014 1402   ALBUMIN 1.9* 02/04/2014 0534   AST 17 03/13/2014 1402   AST 24 02/04/2014 0534   ALT 23 03/13/2014 1402   ALT 18 02/04/2014 0534   ALKPHOS 163* 03/13/2014 1402   ALKPHOS 102 02/04/2014 0534   BILITOT <0.20 03/13/2014 1402   BILITOT 0.4 02/04/2014 0534   GFRNONAA 47* 02/04/2014 0534   GFRAA 55* 02/04/2014 0534    INo results found for: SPEP, UPEP  Lab Results  Component Value Date   WBC 11.0* 03/13/2014  NEUTROABS 7.9* 03/13/2014   HGB 11.2* 03/13/2014   HCT 34.7* 03/13/2014   MCV 93.8 03/13/2014   PLT 416* 03/13/2014      Chemistry      Component Value Date/Time   NA 144 03/13/2014 1402   NA 135 02/04/2014 0534   K 3.6 03/13/2014 1402   K 4.0 02/04/2014 0534   CL 103 02/04/2014 0534   CO2 21* 03/13/2014 1402   CO2 26 02/04/2014 0534   BUN 24.1 03/13/2014 1402   BUN 20 02/04/2014 0534   CREATININE 1.3 03/13/2014 1402   CREATININE 1.75* 02/04/2014 0534      Component Value Date/Time   CALCIUM 8.3* 03/13/2014 1402   CALCIUM 10.6* 02/04/2014 0534   ALKPHOS 163* 03/13/2014 1402   ALKPHOS 102 02/04/2014 0534   AST 17 03/13/2014 1402   AST 24 02/04/2014 0534   ALT 23 03/13/2014 1402   ALT 18 02/04/2014 0534   BILITOT <0.20 03/13/2014 1402   BILITOT 0.4 02/04/2014 0534       No results found for: LABCA2  No components found for: SHUOH729  No results for input(s): INR in the last 168  hours.  Urinalysis    Component Value Date/Time   COLORURINE YELLOW 01/31/2014 1050   APPEARANCEUR CLOUDY* 01/31/2014 1050   LABSPEC 1.019 01/31/2014 1050   PHURINE 6.0 01/31/2014 1050   GLUCOSEU NEGATIVE 01/31/2014 1050   HGBUR LARGE* 01/31/2014 1050   BILIRUBINUR NEGATIVE 01/31/2014 1050   KETONESUR NEGATIVE 01/31/2014 1050   PROTEINUR 100* 01/31/2014 1050   UROBILINOGEN 0.2 01/31/2014 1050   NITRITE NEGATIVE 01/31/2014 1050   LEUKOCYTESUR NEGATIVE 01/31/2014 1050    STUDIES: No results found.  ASSESSMENT: 39 y.o. 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 The patient is not a transplant candidate for financial reasons   PLAN: Matej is tolerating his treatment moderately well. He is going to try some Benadryl at bedtime when he takes the dexamethasone on Tuesdays and Wednesdays to see if that helps him sleep. We are going to continue through 2 more cycles, which is 6 more  weeks of treatment, and then if we have entered remission (with clearing of his M spike and normalization of his kappa lambda ratio) we will try to switch him to lenalidomide. Otherwise he may need another cycle or 2 to get into remission.  He has a good understanding of this plan. He agrees with it. He will call with any problems that may develop before his next visit here. [name], MD   03/20/2014 2:38 PM

## 2014-03-20 NOTE — Patient Instructions (Signed)
[facility] [place] [place] Discharge Instructions for Patients Receiving Chemotherapy  Today you received the following chemotherapy agents Cytoxan.  To help prevent nausea and vomiting after your treatment, we encourage you to take your nausea medication as prescribed.   If you develop nausea and vomiting that is not controlled by your nausea medication, call the clinic.   BELOW ARE SYMPTOMS THAT SHOULD BE REPORTED IMMEDIATELY:  *FEVER GREATER THAN 100.5 F  *CHILLS WITH OR WITHOUT FEVER  NAUSEA AND VOMITING THAT IS NOT CONTROLLED WITH YOUR NAUSEA MEDICATION  *UNUSUAL SHORTNESS OF BREATH  *UNUSUAL BRUISING OR BLEEDING  TENDERNESS IN MOUTH AND THROAT WITH OR WITHOUT PRESENCE OF ULCERS  *URINARY PROBLEMS  *BOWEL PROBLEMS  UNUSUAL RASH Items with * indicate a potential emergency and should be followed up as soon as possible.  Feel free to call the clinic you have any questions or concerns. The clinic phone number is [phone].

## 2014-03-20 NOTE — Addendum Note (Signed)
Addended by: Laureen Abrahams on: 03/20/2014 04:13 PM   Modules accepted: Medications

## 2014-03-24 ENCOUNTER — Other Ambulatory Visit (HOSPITAL_BASED_OUTPATIENT_CLINIC_OR_DEPARTMENT_OTHER): Payer: Self-pay

## 2014-03-24 ENCOUNTER — Ambulatory Visit (HOSPITAL_BASED_OUTPATIENT_CLINIC_OR_DEPARTMENT_OTHER): Payer: Self-pay

## 2014-03-24 DIAGNOSIS — C9000 Multiple myeloma not having achieved remission: Secondary | ICD-10-CM

## 2014-03-24 DIAGNOSIS — M899 Disorder of bone, unspecified: Secondary | ICD-10-CM

## 2014-03-24 DIAGNOSIS — N183 Chronic kidney disease, stage 3 unspecified: Secondary | ICD-10-CM

## 2014-03-24 DIAGNOSIS — Z5112 Encounter for antineoplastic immunotherapy: Secondary | ICD-10-CM

## 2014-03-24 DIAGNOSIS — D630 Anemia in neoplastic disease: Secondary | ICD-10-CM

## 2014-03-24 LAB — CBC WITH DIFFERENTIAL/PLATELET
BASO%: 0.3 % (ref 0.0–2.0)
Basophils Absolute: 0 10*3/uL (ref 0.0–0.1)
EOS%: 0.6 % (ref 0.0–7.0)
Eosinophils Absolute: 0.1 10*3/uL (ref 0.0–0.5)
HCT: 31.8 % — ABNORMAL LOW (ref 38.4–49.9)
HGB: 10.5 g/dL — ABNORMAL LOW (ref 13.0–17.1)
LYMPH#: 1.7 10*3/uL (ref 0.9–3.3)
LYMPH%: 16.6 % (ref 14.0–49.0)
MCH: 31.1 pg (ref 27.2–33.4)
MCHC: 33.2 g/dL (ref 32.0–36.0)
MCV: 93.8 fL (ref 79.3–98.0)
MONO#: 0.8 10*3/uL (ref 0.1–0.9)
MONO%: 7.9 % (ref 0.0–14.0)
NEUT#: 7.7 10*3/uL — ABNORMAL HIGH (ref 1.5–6.5)
NEUT%: 74.6 % (ref 39.0–75.0)
Platelets: 566 10*3/uL — ABNORMAL HIGH (ref 140–400)
RBC: 3.39 10*6/uL — AB (ref 4.20–5.82)
RDW: 14.4 % (ref 11.0–14.6)
WBC: 10.4 10*3/uL — ABNORMAL HIGH (ref 4.0–10.3)

## 2014-03-24 MED ORDER — ONDANSETRON HCL 8 MG PO TABS
8.0000 mg | ORAL_TABLET | Freq: Once | ORAL | Status: AC
  Administered 2014-03-24: 8 mg via ORAL

## 2014-03-24 MED ORDER — ONDANSETRON HCL 8 MG PO TABS
ORAL_TABLET | ORAL | Status: AC
  Filled 2014-03-24: qty 1

## 2014-03-24 MED ORDER — BORTEZOMIB CHEMO SQ INJECTION 3.5 MG (2.5MG/ML)
1.3000 mg/m2 | Freq: Once | INTRAMUSCULAR | Status: AC
  Administered 2014-03-24: 2.25 mg via SUBCUTANEOUS
  Filled 2014-03-24: qty 2.25

## 2014-03-24 NOTE — Patient Instructions (Signed)
[facility] Discharge Instructions for Patients Receiving Chemotherapy  Today you received the following chemotherapy agents: Velcade.  To help prevent nausea and vomiting after your treatment, we encourage you to take your nausea medication: Zofran 8 mg every 12 hours as needed.   If you develop nausea and vomiting that is not controlled by your nausea medication, call the clinic.   BELOW ARE SYMPTOMS THAT SHOULD BE REPORTED IMMEDIATELY:  *FEVER GREATER THAN 100.5 F  *CHILLS WITH OR WITHOUT FEVER  NAUSEA AND VOMITING THAT IS NOT CONTROLLED WITH YOUR NAUSEA MEDICATION  *UNUSUAL SHORTNESS OF BREATH  *UNUSUAL BRUISING OR BLEEDING  TENDERNESS IN MOUTH AND THROAT WITH OR WITHOUT PRESENCE OF ULCERS  *URINARY PROBLEMS  *BOWEL PROBLEMS  UNUSUAL RASH Items with * indicate a potential emergency and should be followed up as soon as possible.  Feel free to call the clinic you have any questions or concerns. The clinic phone number is [phone].

## 2014-03-27 ENCOUNTER — Telehealth: Payer: Self-pay | Admitting: Oncology

## 2014-03-27 ENCOUNTER — Ambulatory Visit (HOSPITAL_BASED_OUTPATIENT_CLINIC_OR_DEPARTMENT_OTHER): Payer: Self-pay

## 2014-03-27 ENCOUNTER — Other Ambulatory Visit (HOSPITAL_BASED_OUTPATIENT_CLINIC_OR_DEPARTMENT_OTHER): Payer: Self-pay

## 2014-03-27 ENCOUNTER — Ambulatory Visit (HOSPITAL_BASED_OUTPATIENT_CLINIC_OR_DEPARTMENT_OTHER): Payer: Self-pay | Admitting: Nurse Practitioner

## 2014-03-27 ENCOUNTER — Encounter: Payer: Self-pay | Admitting: Oncology

## 2014-03-27 ENCOUNTER — Encounter: Payer: Self-pay | Admitting: Nurse Practitioner

## 2014-03-27 VITALS — BP 129/81 | HR 70 | Temp 98.1°F | Resp 18 | Ht 62.0 in | Wt 151.4 lb

## 2014-03-27 DIAGNOSIS — Z5111 Encounter for antineoplastic chemotherapy: Secondary | ICD-10-CM

## 2014-03-27 DIAGNOSIS — M899 Disorder of bone, unspecified: Secondary | ICD-10-CM

## 2014-03-27 DIAGNOSIS — C9000 Multiple myeloma not having achieved remission: Secondary | ICD-10-CM

## 2014-03-27 DIAGNOSIS — D630 Anemia in neoplastic disease: Secondary | ICD-10-CM

## 2014-03-27 DIAGNOSIS — N183 Chronic kidney disease, stage 3 unspecified: Secondary | ICD-10-CM

## 2014-03-27 DIAGNOSIS — Z5112 Encounter for antineoplastic immunotherapy: Secondary | ICD-10-CM

## 2014-03-27 DIAGNOSIS — M898X9 Other specified disorders of bone, unspecified site: Secondary | ICD-10-CM

## 2014-03-27 LAB — COMPREHENSIVE METABOLIC PANEL (CC13)
ALBUMIN: 2.5 g/dL — AB (ref 3.5–5.0)
ALK PHOS: 142 U/L (ref 40–150)
ALT: 16 U/L (ref 0–55)
AST: 15 U/L (ref 5–34)
Anion Gap: 9 mEq/L (ref 3–11)
BUN: 19.3 mg/dL (ref 7.0–26.0)
CALCIUM: 8.3 mg/dL — AB (ref 8.4–10.4)
CHLORIDE: 111 meq/L — AB (ref 98–109)
CO2: 21 mEq/L — ABNORMAL LOW (ref 22–29)
Creatinine: 0.9 mg/dL (ref 0.7–1.3)
EGFR: >90 mL/min/{1.73_m2} (ref >90)
GLUCOSE: 93 mg/dL (ref 70–140)
POTASSIUM: 3.5 meq/L (ref 3.5–5.1)
SODIUM: 141 meq/L (ref 136–145)
TOTAL PROTEIN: 5.9 g/dL — AB (ref 6.4–8.3)
Total Bilirubin: <0.2 mg/dL (ref 0.20–1.20)

## 2014-03-27 LAB — CBC WITH DIFFERENTIAL/PLATELET
BASO%: 0.1 % (ref 0.0–2.0)
Basophils Absolute: 0 10*3/uL (ref 0.0–0.1)
EOS%: 0.2 % (ref 0.0–7.0)
Eosinophils Absolute: 0 10*3/uL (ref 0.0–0.5)
HCT: 30.8 % — ABNORMAL LOW (ref 38.4–49.9)
HEMOGLOBIN: 10.1 g/dL — AB (ref 13.0–17.1)
LYMPH%: 18.2 % (ref 14.0–49.0)
MCH: 31 pg (ref 27.2–33.4)
MCHC: 32.8 g/dL (ref 32.0–36.0)
MCV: 94.5 fL (ref 79.3–98.0)
MONO#: 1.3 10*3/uL — ABNORMAL HIGH (ref 0.1–0.9)
MONO%: 10.3 % (ref 0.0–14.0)
NEUT%: 71.2 % (ref 39.0–75.0)
NEUTROS ABS: 8.7 10*3/uL — AB (ref 1.5–6.5)
Platelets: 536 10*3/uL — ABNORMAL HIGH (ref 140–400)
RBC: 3.26 10*6/uL — ABNORMAL LOW (ref 4.20–5.82)
RDW: 15.1 % — ABNORMAL HIGH (ref 11.0–14.6)
WBC: 12.1 10*3/uL — ABNORMAL HIGH (ref 4.0–10.3)
lymph#: 2.2 10*3/uL (ref 0.9–3.3)
nRBC: 0 % (ref 0–0)

## 2014-03-27 LAB — PROTEIN / CREATININE RATIO, URINE
Creatinine, Urine: 104.4 mg/dL
Protein Creatinine Ratio: 0.32 — ABNORMAL HIGH (ref <0.15)
Total Protein, Urine: 33 mg/dL — ABNORMAL HIGH (ref 5–25)

## 2014-03-27 LAB — LACTATE DEHYDROGENASE (CC13): LDH: 177 U/L (ref 125–245)

## 2014-03-27 MED ORDER — ONDANSETRON HCL 8 MG PO TABS
8.0000 mg | ORAL_TABLET | Freq: Once | ORAL | Status: AC
  Administered 2014-03-27: 8 mg via ORAL

## 2014-03-27 MED ORDER — ONDANSETRON HCL 8 MG PO TABS
ORAL_TABLET | ORAL | Status: AC
  Filled 2014-03-27: qty 1

## 2014-03-27 MED ORDER — BORTEZOMIB CHEMO SQ INJECTION 3.5 MG (2.5MG/ML)
1.3000 mg/m2 | Freq: Once | INTRAMUSCULAR | Status: AC
  Administered 2014-03-27: 2.25 mg via SUBCUTANEOUS
  Filled 2014-03-27: qty 2.25

## 2014-03-27 MED ORDER — SODIUM CHLORIDE 0.9 % IV SOLN
300.0000 mg/m2 | Freq: Once | INTRAVENOUS | Status: AC
  Administered 2014-03-27: 520 mg via INTRAVENOUS
  Filled 2014-03-27: qty 26

## 2014-03-27 NOTE — Telephone Encounter (Signed)
added 2.29 appt....pt will get sched in tx

## 2014-03-27 NOTE — Patient Instructions (Signed)
[facility] Discharge Instructions for Patients Receiving Chemotherapy  Today you received the following chemotherapy agents Cytoxan, Velcade  To help prevent nausea and vomiting after your treatment, we encourage you to take your nausea medication as directed/prescribed   If you develop nausea and vomiting that is not controlled by your nausea medication, call the clinic.   BELOW ARE SYMPTOMS THAT SHOULD BE REPORTED IMMEDIATELY:  *FEVER GREATER THAN 100.5 F  *CHILLS WITH OR WITHOUT FEVER  NAUSEA AND VOMITING THAT IS NOT CONTROLLED WITH YOUR NAUSEA MEDICATION  *UNUSUAL SHORTNESS OF BREATH  *UNUSUAL BRUISING OR BLEEDING  TENDERNESS IN MOUTH AND THROAT WITH OR WITHOUT PRESENCE OF ULCERS  *URINARY PROBLEMS  *BOWEL PROBLEMS  UNUSUAL RASH Items with * indicate a potential emergency and should be followed up as soon as possible.  Feel free to call the clinic you have any questions or concerns. The clinic phone number is [phone].

## 2014-03-27 NOTE — Progress Notes (Signed)
Checked [employer] for Cytoxan and Velcade and there is no asst available for uninsured.

## 2014-03-27 NOTE — Progress Notes (Signed)
[facility]  Telephone:(336) [phone] Fax:(336) [phone]     ID: [name] DOB: [birth year]  MR#: [id number]  BVQ#:[id number]  Patient Care Team: Tresa Garter, MD as PCP - General (Internal Medicine) PCP: [name], MD GYN: SU:  OTHER MD:  CHIEF COMPLAINT: multiple myeloma  CURRENT TREATMENT: bortezomib, cyclophosphamide, dexamethasone, zolendronate   HISTORY  OF MULTIPLE MYELOMA: From the original intake note January 2016:  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 The pain was worse with coughing. Exam was unremarkable, and he was treated with Mobic. No labs were obtained. On 01/30/2014 he presented to the emergency room at [facility] with similar complaints and also reported a 10 pound weight loss over the past month. A chest x-ray was significant only for mild atelectasis, but ACT NGO of the chest 01/30/2014, while it showed no pulmonary emboli, showed an osteolytic lesion destroying the manubrium of the sternum. There was extra osseous extension into the anterior mediastinum.  The patient was admitted and found to have a creatinine of 1.64 with a GFR of 51. Bilateral renal ultrasound showed no hydronephrosis or masses, and normal size and cortical thickness of both kidneys, though there was mild diffuse echogenicity. Urine total protein was 145 mg/dL and immunofixation showed a monoclonal IgG kappa protein as well as monoclonal free kappa light chains. On 01/31/2014 serum protein electrophoresis showed an M spike of 2.22 g/dL with a total protein of 8.3 and albumin 3.4. Kappa lambda light chains from the serum obtained 02/04/2014 showed 10.50 mg/dL on the, 1.74 in the lambda, with an elevated ratio at 6.03. Beta-2 microglobulin was 7.36. LDH was normal at 184 and HIV antibody was nonreactive.  Biopsy of  the manubrial mass 02/03/2014 showed extensive infiltration of the bone by sheets of atypical plasma cells, positive for CD138, kappa restricted (SZA 16-23). Right iliac bone marrow biopsy [name] 05/21/2014 (FZB 16-1) showed an average of 12% plasma cells with numerous aggregates and sheets of atypical plasma cells, which were kappa restricted. Cytogenetic analysis is pending  The patient's subsequent history is as detailed below  INTERVAL HISTORY: Mr.Jose-Gonzalez returns today for follow-up of his multiple myeloma. Today is day 4 cycle 3 of his  Bortezomib/ dexamethasone/ cyclophosphamide treatments, with bortezomib given on days 1, 4, 8, and 11 of every 21 day cycle, dexamethasone 20 mg taken every Tuesday and Wednesday, and cyclophosphamide infused weekly (on Thursdays).   REVIEW OF SYSTEMS: Terry denies fever or chills. His nausea is managed with zofran PRN. He denies constipation or diarrhea. His appetite is "fair." His main complaint continues to be fatigue. The steroids keep him up at night. He was advised to try benadryl PRN QHS at his last visit, but prefers not to take any more pills than he has to. He denies headaches, dizziness, night sweats, or weight loss. A detailed review of systems is otherwise stable.  PAST MEDICAL HISTORY: Past Medical History  Diagnosis Date  . IgG myeloma   . CKD (chronic kidney disease), stage III   . Hypercholesterolemia   . Lytic bone lesions on xray     PAST SURGICAL HISTORY: No past surgical history on file.  FAMILY HISTORY No family history on file. The patient's parents are still living, in their late 47's. The patient has 2 brothers, 3 sisters. There is noc cancer in the fmaily to  his knowledge.  SOCIAL HISTORY:  Originally from [facility]) [place], moved here 19 years ago. He works in Nurse, adult. Married (wife's name is [name]), lives in [place]. He has 5 children ages 26, 60, 11, 1 and 30 months [name], [name],  [name] and [place]) in good health. Best number to call him is [phone]    ADVANCED DIRECTIVES: not  In place   HEALTH MAINTENANCE: History  Substance Use Topics  . Smoking status: Never Smoker   . Smokeless tobacco: Never Used  . Alcohol Use: No     Colonoscopy:  PSA:  Bone density:  Lipid panel:  No Known Allergies  Current Outpatient Prescriptions  Medication Sig Dispense Refill  . acetaminophen (TYLENOL) 325 MG tablet Take 2 tablets (650 mg total) by mouth every 6 (six) hours as needed for mild pain (or Fever >/= 101).    Marland Kitchen acyclovir (ZOVIRAX) 400 MG tablet Take 1 tablet (400 mg total) by mouth 5 (five) times daily. 60 tablet 6  . dexamethasone (DECADRON) 4 MG tablet Tome 5 pastillas al desayuno cada martes y miercoles (take 5 tablets w breakfast every Tuesday and wes) 50 tablet 12  . ondansetron (ZOFRAN) 8 MG tablet Take 1 tablet (8 mg total) by mouth 2 (two) times daily. 20 tablet 0  . oxyCODONE (OXY IR/ROXICODONE) 5 MG immediate release tablet Take 1 tablet (5 mg total) by mouth every 6 (six) hours as needed for moderate pain. 20 tablet 0   No current facility-administered medications for this visit.    OBJECTIVE: [name] 39 Vitals:   03/27/14 1422  BP: 129/81  Pulse: 70  Temp: 98.1 F (36.7 C)  Resp: 18     Body mass index is 27.68 kg/(m^2).    ECOG FS:1 - Symptomatic but completely ambulatory  Sclerae unicteric, pupils equal and reactive Oropharynx clear and moist-- no thrush No cervical or supraclavicular adenopathy Lungs no rales or rhonchi Heart regular rate and rhythm Abd soft, nontender, positive bowel sounds MSK no focal spinal tenderness, no upper extremity lymphedema Neuro: nonfocal, well oriented, appropriate affect  LAB RESULTS:  CMP     Component Value Date/Time   NA 144 03/20/2014 1523   NA 135 02/04/2014 0534   K 3.5 03/20/2014 1523   K 4.0 02/04/2014 0534   CL 103 02/04/2014 0534   CO2 25  03/20/2014 1523   CO2 26 02/04/2014 0534   GLUCOSE 104 03/20/2014 1523   GLUCOSE 115* 02/04/2014 0534   BUN 22.7 03/20/2014 1523   BUN 20 02/04/2014 0534   CREATININE 1.1 03/20/2014 1523   CREATININE 1.75* 02/04/2014 0534   CALCIUM 8.2* 03/20/2014 1523   CALCIUM 10.6* 02/04/2014 0534   PROT 5.7* 03/20/2014 1523   PROT 7.5 02/04/2014 0534   ALBUMIN 2.5* 03/20/2014 1523   ALBUMIN 1.9* 02/04/2014 0534   AST 16 03/20/2014 1523   AST 24 02/04/2014 0534   ALT 18 03/20/2014 1523   ALT 18 02/04/2014 0534   ALKPHOS 144 03/20/2014 1523   ALKPHOS 102 02/04/2014 0534   BILITOT <0.20 03/20/2014 1523   BILITOT 0.4 02/04/2014 0534   GFRNONAA 47* 02/04/2014 0534   GFRAA 55* 02/04/2014 0534    INo results found for: SPEP, UPEP  Lab Results  Component Value Date   WBC 12.1* 03/27/2014   NEUTROABS 8.7* 03/27/2014   HGB 10.1* 03/27/2014   HCT 30.8* 03/27/2014   MCV 94.5 03/27/2014   PLT 536* 03/27/2014  Chemistry      Component Value Date/Time   NA 144 03/20/2014 1523   NA 135 02/04/2014 0534   K 3.5 03/20/2014 1523   K 4.0 02/04/2014 0534   CL 103 02/04/2014 0534   CO2 25 03/20/2014 1523   CO2 26 02/04/2014 0534   BUN 22.7 03/20/2014 1523   BUN 20 02/04/2014 0534   CREATININE 1.1 03/20/2014 1523   CREATININE 1.75* 02/04/2014 0534      Component Value Date/Time   CALCIUM 8.2* 03/20/2014 1523   CALCIUM 10.6* 02/04/2014 0534   ALKPHOS 144 03/20/2014 1523   ALKPHOS 102 02/04/2014 0534   AST 16 03/20/2014 1523   AST 24 02/04/2014 0534   ALT 18 03/20/2014 1523   ALT 18 02/04/2014 0534   BILITOT <0.20 03/20/2014 1523   BILITOT 0.4 02/04/2014 0534       No results found for: LABCA2  No components found for: LABCA125  No results for input(s): INR in the last 168 hours.  Urinalysis    Component Value Date/Time   COLORURINE YELLOW 01/31/2014 1050   APPEARANCEUR CLOUDY* 01/31/2014 1050   LABSPEC 1.019 01/31/2014 1050   PHURINE 6.0 01/31/2014 1050   GLUCOSEU  NEGATIVE 01/31/2014 1050   HGBUR LARGE* 01/31/2014 1050   BILIRUBINUR NEGATIVE 01/31/2014 1050   KETONESUR NEGATIVE 01/31/2014 1050   PROTEINUR 100* 01/31/2014 1050   UROBILINOGEN 0.2 01/31/2014 1050   NITRITE NEGATIVE 01/31/2014 1050   LEUKOCYTESUR NEGATIVE 01/31/2014 1050    STUDIES: No results found.  ASSESSMENT: 39 y.o. 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 The patient is not a transplant candidate for financial reasons   PLAN: Bernard continues to do well with treatment. The labs were reviewed in detail and were stable. He will proceed with day 4, cycle 3 bortezomib and cyclophosphamide today as planned.   Alexei will continue with dexamethasone as advised on Tuesdays and Wednesdays. He will have cyclophosphamide again next Thursday and velcade again on 2/29 . His next office visit will be on with me on 3/7. He understands and agrees with this plan. He has been encouraged  to call with any issues that might arise before his next visit here.  Marcelino Duster, NP   03/27/2014 2:25 PM

## 2014-03-31 ENCOUNTER — Ambulatory Visit (HOSPITAL_BASED_OUTPATIENT_CLINIC_OR_DEPARTMENT_OTHER): Payer: Self-pay

## 2014-03-31 DIAGNOSIS — M899 Disorder of bone, unspecified: Secondary | ICD-10-CM

## 2014-03-31 DIAGNOSIS — C9000 Multiple myeloma not having achieved remission: Secondary | ICD-10-CM

## 2014-03-31 DIAGNOSIS — Z5112 Encounter for antineoplastic immunotherapy: Secondary | ICD-10-CM

## 2014-03-31 LAB — PROTEIN ELECTROPHORESIS, SERUM
ALPHA-1-GLOBULIN: 6.8 % — AB (ref 2.9–4.9)
ALPHA-2-GLOBULIN: 22.2 % — AB (ref 7.1–11.8)
Albumin ELP: 47.5 % — ABNORMAL LOW (ref 55.8–66.1)
Beta 2: 3.7 % (ref 3.2–6.5)
Beta Globulin: 6.4 % (ref 4.7–7.2)
Gamma Globulin: 13.4 % (ref 11.1–18.8)
M-Spike, %: 0.45 g/dL
TOTAL PROTEIN, SERUM ELECTROPHOR: 5.7 g/dL — AB (ref 6.0–8.3)

## 2014-03-31 LAB — IGG, IGA, IGM
IgA: 89 mg/dL (ref 68–379)
IgG (Immunoglobin G), Serum: 887 mg/dL (ref 650–1600)
IgM, Serum: 52 mg/dL (ref 41–251)

## 2014-03-31 LAB — KAPPA/LAMBDA LIGHT CHAINS
KAPPA LAMBDA RATIO: 2.37 — AB (ref 0.26–1.65)
Kappa free light chain: 3.87 mg/dL — ABNORMAL HIGH (ref 0.33–1.94)
LAMBDA FREE LGHT CHN: 1.63 mg/dL (ref 0.57–2.63)

## 2014-03-31 LAB — BETA 2 MICROGLOBULIN, SERUM: Beta-2 Microglobulin: 3.62 mg/L — ABNORMAL HIGH (ref <=2.51)

## 2014-03-31 MED ORDER — ONDANSETRON HCL 8 MG PO TABS
8.0000 mg | ORAL_TABLET | Freq: Once | ORAL | Status: AC
  Administered 2014-03-31: 8 mg via ORAL

## 2014-03-31 MED ORDER — BORTEZOMIB CHEMO SQ INJECTION 3.5 MG (2.5MG/ML)
1.3000 mg/m2 | Freq: Once | INTRAMUSCULAR | Status: AC
  Administered 2014-03-31: 2.25 mg via SUBCUTANEOUS
  Filled 2014-03-31: qty 2.25

## 2014-03-31 MED ORDER — ONDANSETRON HCL 8 MG PO TABS
ORAL_TABLET | ORAL | Status: AC
  Filled 2014-03-31: qty 1

## 2014-03-31 NOTE — Patient Instructions (Signed)
[facility] Discharge Instructions for Patients Receiving Chemotherapy  Today you received the following chemotherapy agents velcade   To help prevent nausea and vomiting after your treatment, we encourage you to take your nausea medication as directed  If you develop nausea and vomiting that is not controlled by your nausea medication, call the clinic.   BELOW ARE SYMPTOMS THAT SHOULD BE REPORTED IMMEDIATELY:  *FEVER GREATER THAN 100.5 F  *CHILLS WITH OR WITHOUT FEVER  NAUSEA AND VOMITING THAT IS NOT CONTROLLED WITH YOUR NAUSEA MEDICATION  *UNUSUAL SHORTNESS OF BREATH  *UNUSUAL BRUISING OR BLEEDING  TENDERNESS IN MOUTH AND THROAT WITH OR WITHOUT PRESENCE OF ULCERS  *URINARY PROBLEMS  *BOWEL PROBLEMS  UNUSUAL RASH Items with * indicate a potential emergency and should be followed up as soon as possible.  Feel free to call the clinic you have any questions or concerns. The clinic phone number is [phone].  

## 2014-04-03 ENCOUNTER — Other Ambulatory Visit: Payer: Self-pay

## 2014-04-03 ENCOUNTER — Ambulatory Visit (HOSPITAL_BASED_OUTPATIENT_CLINIC_OR_DEPARTMENT_OTHER): Payer: Medicaid Other

## 2014-04-03 ENCOUNTER — Other Ambulatory Visit: Payer: Self-pay | Admitting: *Deleted

## 2014-04-03 DIAGNOSIS — D630 Anemia in neoplastic disease: Secondary | ICD-10-CM

## 2014-04-03 DIAGNOSIS — N183 Chronic kidney disease, stage 3 unspecified: Secondary | ICD-10-CM

## 2014-04-03 DIAGNOSIS — C9000 Multiple myeloma not having achieved remission: Secondary | ICD-10-CM

## 2014-04-03 DIAGNOSIS — M899 Disorder of bone, unspecified: Secondary | ICD-10-CM

## 2014-04-03 DIAGNOSIS — Z5112 Encounter for antineoplastic immunotherapy: Secondary | ICD-10-CM

## 2014-04-03 LAB — CBC WITH DIFFERENTIAL/PLATELET
BASO%: 0.1 % (ref 0.0–2.0)
Basophils Absolute: 0 10*3/uL (ref 0.0–0.1)
EOS%: 0.1 % (ref 0.0–7.0)
Eosinophils Absolute: 0 10*3/uL (ref 0.0–0.5)
HCT: 30 % — ABNORMAL LOW (ref 38.4–49.9)
HGB: 9.7 g/dL — ABNORMAL LOW (ref 13.0–17.1)
LYMPH%: 18.1 % (ref 14.0–49.0)
MCH: 30.9 pg (ref 27.2–33.4)
MCHC: 32.3 g/dL (ref 32.0–36.0)
MCV: 95.5 fL (ref 79.3–98.0)
MONO#: 1.5 10*3/uL — ABNORMAL HIGH (ref 0.1–0.9)
MONO%: 14 % (ref 0.0–14.0)
NEUT#: 7.3 10*3/uL — ABNORMAL HIGH (ref 1.5–6.5)
NEUT%: 67.7 % (ref 39.0–75.0)
PLATELETS: 361 10*3/uL (ref 140–400)
RBC: 3.14 10*6/uL — AB (ref 4.20–5.82)
RDW: 15.9 % — AB (ref 11.0–14.6)
WBC: 10.8 10*3/uL — ABNORMAL HIGH (ref 4.0–10.3)
lymph#: 2 10*3/uL (ref 0.9–3.3)
nRBC: 0 % (ref 0–0)

## 2014-04-03 MED ORDER — SODIUM CHLORIDE 0.9 % IV SOLN
300.0000 mg/m2 | Freq: Once | INTRAVENOUS | Status: AC
  Administered 2014-04-03: 520 mg via INTRAVENOUS
  Filled 2014-04-03: qty 26

## 2014-04-03 MED ORDER — BORTEZOMIB CHEMO SQ INJECTION 3.5 MG (2.5MG/ML)
1.3000 mg/m2 | Freq: Once | INTRAMUSCULAR | Status: AC
  Administered 2014-04-03: 2.25 mg via SUBCUTANEOUS
  Filled 2014-04-03: qty 2.25

## 2014-04-03 MED ORDER — ONDANSETRON HCL 8 MG PO TABS
ORAL_TABLET | ORAL | Status: AC
  Filled 2014-04-03: qty 1

## 2014-04-03 MED ORDER — ONDANSETRON HCL 8 MG PO TABS
8.0000 mg | ORAL_TABLET | Freq: Once | ORAL | Status: AC
  Administered 2014-04-03: 8 mg via ORAL

## 2014-04-03 NOTE — Patient Instructions (Signed)
[facility] Discharge Instructions for Patients Receiving Chemotherapy  Today you received the following chemotherapy agents: Velcade and Cytoxan. Continue Decadron as prescribed. To help prevent nausea and vomiting after your treatment, we encourage you to take your nausea medication: Zofran. Take one every 8 hours as needed.   If you develop nausea and vomiting that is not controlled by your nausea medication, call the clinic.   BELOW ARE SYMPTOMS THAT SHOULD BE REPORTED IMMEDIATELY:  *FEVER GREATER THAN 100.5 F  *CHILLS WITH OR WITHOUT FEVER  NAUSEA AND VOMITING THAT IS NOT CONTROLLED WITH YOUR NAUSEA MEDICATION  *UNUSUAL SHORTNESS OF BREATH  *UNUSUAL BRUISING OR BLEEDING  TENDERNESS IN MOUTH AND THROAT WITH OR WITHOUT PRESENCE OF ULCERS  *URINARY PROBLEMS  *BOWEL PROBLEMS  UNUSUAL RASH Items with * indicate a potential emergency and should be followed up as soon as possible.  Feel free to call the clinic should you have any questions or concerns. The clinic phone number is [phone].

## 2014-04-04 ENCOUNTER — Other Ambulatory Visit: Payer: Self-pay | Admitting: *Deleted

## 2014-04-04 DIAGNOSIS — C9000 Multiple myeloma not having achieved remission: Secondary | ICD-10-CM

## 2014-04-07 ENCOUNTER — Ambulatory Visit (HOSPITAL_BASED_OUTPATIENT_CLINIC_OR_DEPARTMENT_OTHER): Payer: Medicaid Other | Admitting: Nurse Practitioner

## 2014-04-07 ENCOUNTER — Other Ambulatory Visit (HOSPITAL_BASED_OUTPATIENT_CLINIC_OR_DEPARTMENT_OTHER): Payer: Self-pay

## 2014-04-07 ENCOUNTER — Encounter: Payer: Self-pay | Admitting: Nurse Practitioner

## 2014-04-07 ENCOUNTER — Other Ambulatory Visit: Payer: Self-pay

## 2014-04-07 ENCOUNTER — Other Ambulatory Visit: Payer: Self-pay | Admitting: Oncology

## 2014-04-07 ENCOUNTER — Ambulatory Visit: Payer: Self-pay

## 2014-04-07 VITALS — BP 139/87 | HR 20 | Temp 98.7°F | Resp 20 | Ht 62.0 in | Wt 151.6 lb

## 2014-04-07 DIAGNOSIS — N183 Chronic kidney disease, stage 3 (moderate): Secondary | ICD-10-CM

## 2014-04-07 DIAGNOSIS — C9000 Multiple myeloma not having achieved remission: Secondary | ICD-10-CM

## 2014-04-07 LAB — CBC WITH DIFFERENTIAL/PLATELET
BASO%: 0.4 % (ref 0.0–2.0)
Basophils Absolute: 0 10*3/uL (ref 0.0–0.1)
EOS%: 0.7 % (ref 0.0–7.0)
Eosinophils Absolute: 0.1 10*3/uL (ref 0.0–0.5)
HCT: 32.6 % — ABNORMAL LOW (ref 38.4–49.9)
HEMOGLOBIN: 10.7 g/dL — AB (ref 13.0–17.1)
LYMPH#: 1.8 10*3/uL (ref 0.9–3.3)
LYMPH%: 17.9 % (ref 14.0–49.0)
MCH: 31.5 pg (ref 27.2–33.4)
MCHC: 32.6 g/dL (ref 32.0–36.0)
MCV: 96.6 fL (ref 79.3–98.0)
MONO#: 0.8 10*3/uL (ref 0.1–0.9)
MONO%: 8.1 % (ref 0.0–14.0)
NEUT#: 7.1 10*3/uL — ABNORMAL HIGH (ref 1.5–6.5)
NEUT%: 72.9 % (ref 39.0–75.0)
PLATELETS: 381 10*3/uL (ref 140–400)
RBC: 3.38 10*6/uL — ABNORMAL LOW (ref 4.20–5.82)
RDW: 16.4 % — AB (ref 11.0–14.6)
WBC: 9.8 10*3/uL (ref 4.0–10.3)

## 2014-04-07 MED ORDER — ONDANSETRON HCL 8 MG PO TABS
ORAL_TABLET | ORAL | Status: AC
  Filled 2014-04-07: qty 1

## 2014-04-07 MED ORDER — ACYCLOVIR 5 % EX OINT: 1.0000 "application " | TOPICAL_OINTMENT | CUTANEOUS | Status: DC

## 2014-04-07 NOTE — Progress Notes (Signed)
Pt not due for velcade injection today per [employer].  Pt to return on Thursday 3/10 for cytoxan only.  Spoke with patient, pt verbalized understanding.

## 2014-04-07 NOTE — Progress Notes (Signed)
[facility]  [phone] [phone]     ID: Charles [name] DOB: 07-29-1975  MR#: [id number]  QPY#:[id number]  Patient Care Team: Tresa Garter, MD as PCP - General (Internal Medicine) PCP: [name], MD GYN: SU:  OTHER MD:  CHIEF COMPLAINT: multiple myeloma CURRENT TREATMENT: bortezomib, cyclophosphamide, dexamethasone, zolendronate  HISTORY  OF MULTIPLE MYELOMA: From the original intake note January 2016:  Charles [name] ([name] is not his first name; 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 The pain was worse with coughing. Exam was unremarkable, and he was treated with Mobic. No labs were obtained. On 01/30/2014 he presented to the emergency room at [facility] with similar complaints and also reported a 10 pound weight loss over the past month. A chest x-ray was significant only for mild atelectasis, but ACT NGO of the chest 01/30/2014, while it showed no pulmonary emboli, showed an osteolytic lesion destroying the manubrium of the sternum. There was extra osseous extension into the anterior mediastinum.  The patient was admitted and found to have a creatinine of 1.64 with a GFR of 51. Bilateral renal ultrasound showed no hydronephrosis or masses, and normal size and cortical thickness of both kidneys, though there was mild diffuse echogenicity. Urine total protein was 145 mg/dL and immunofixation showed a monoclonal IgG kappa protein as well as monoclonal free kappa light chains. On 01/31/2014 serum protein electrophoresis showed an M spike of 2.22 g/dL with a total protein of 8.3 and albumin 3.4. Kappa lambda light chains from the serum obtained 02/04/2014 showed 10.50 mg/dL on the, 1.74 in the lambda, with an elevated ratio at 6.03. Beta-2 microglobulin was 7.36. LDH was normal at 184 and HIV antibody was nonreactive.  Biopsy of the  manubrial mass 02/03/2014 showed extensive infiltration of the bone by sheets of atypical plasma cells, positive for CD138, kappa restricted (SZA 16-23). Right iliac bone marrow biopsy [name] 05/21/2014 (FZB 16-1) showed an average of 12% plasma cells with numerous aggregates and sheets of atypical plasma cells, which were kappa restricted. Cytogenetic analysis is pending  The patient's subsequent history is as detailed below  INTERVAL HISTORY: Charles [name] returns today for follow-up of his multiple myeloma, accompanied by his wife. Today is day 15 cycle 3 of his  Bortezomib/ dexamethasone/ cyclophosphamide treatments, with bortezomib given on days 1, 4, 8, and 11 of every 21 day cycle, dexamethasone 20 mg taken every Tuesday and Wednesday, and cyclophosphamide infused weekly (on Thursdays).   REVIEW OF SYSTEMS: Charles [name] denies fever or chills. His nausea is managed with zofran PRN. He denies constipation or diarrhea. His appetite is "fair." He is fatigued during the day and dizzy if he stands up too quickly. The steroids keep him up at night, but he is getting used today. He denies headaches, dizziness, night sweats, or weight loss. New this week are painful clusters to his upperlip, covered by his mustache. He also has the return of pain to his left upper ribs towards his back. This is not new and not painful enough to use his oxycodone on.  A detailed review of systems is otherwise stable.  PAST MEDICAL HISTORY: Past Medical History  Diagnosis Date  . IgG myeloma   . CKD (chronic kidney disease), stage III   . Hypercholesterolemia   . Lytic bone lesions on xray     PAST SURGICAL HISTORY: No past surgical history on file.  FAMILY HISTORY No family history on file. The patient's parents are still living, in their late 37's. The patient has 2 brothers, 3 sisters. There is noc cancer in the fmaily to his knowledge.  SOCIAL HISTORY:  Originally from [facility], moved here 19 years  ago. He works in Nurse, adult. Married (wife's name is Charles [name]), lives in [place]. He has 5 children ages 18, 53, 72, 35 and 18 months Charles [name], Charles [name], Charles [name] and Charles [name]) in good health. Best number to call him is [phone]    ADVANCED DIRECTIVES: not  In place   HEALTH MAINTENANCE: History  Substance Use Topics  . Smoking status: Never Smoker   . Smokeless tobacco: Never Used  . Alcohol Use: No     Colonoscopy:  PSA:  Bone density:  Lipid panel:  No Known Allergies  Current Outpatient Prescriptions  Medication Sig Dispense Refill  . acyclovir (ZOVIRAX) 400 MG tablet Take 1 tablet (400 mg total) by mouth 5 (five) times daily. 60 tablet 6  . dexamethasone (DECADRON) 4 MG tablet Tome 5 pastillas al desayuno cada martes y miercoles (take 5 tablets w breakfast every Tuesday and wes) 50 tablet 12  . ondansetron (ZOFRAN) 8 MG tablet Take 1 tablet (8 mg total) by mouth 2 (two) times daily. 20 tablet 0  . acetaminophen (TYLENOL) 325 MG tablet Take 2 tablets (650 mg total) by mouth every 6 (six) hours as needed for mild pain (or Fever >/= 101). (Patient not taking: Reported on 04/07/2014)    . acyclovir ointment (ZOVIRAX) 5 % Apply 1 application topically every 3 (three) hours. 15 g 1  . oxyCODONE (OXY IR/ROXICODONE) 5 MG immediate release tablet Take 1 tablet (5 mg total) by mouth every 6 (six) hours as needed for moderate pain. (Patient not taking: Reported on 04/07/2014) 20 tablet 0   No current facility-administered medications for this visit.    OBJECTIVE: Charles [name] man who appears stated age 38 Vitals:   04/07/14 1131  BP: 139/87  Pulse: 20  Temp: 98.7 F (37.1 C)  Resp: 20     Body mass index is 27.72 kg/(m^2).    ECOG FS:1 - Symptomatic but completely ambulatory  Skin: warm, dry  HEENT: sclerae anicteric, conjunctivae pink, oropharynx clear. No thrush or mucositis. clustered lesion to upper lip Lymph Nodes: No cervical or supraclavicular  lymphadenopathy  Lungs: clear to auscultation bilaterally, no rales, wheezes, or rhonci  Heart: regular rate and rhythm  Abdomen: round, soft, non tender, positive bowel sounds  Musculoskeletal: No focal spinal tenderness, no peripheral edema  Neuro: non focal, well oriented, positive affect    LAB RESULTS:  CMP     Component Value Date/Time   NA 141 03/27/2014 1348   NA 135 02/04/2014 0534   K 3.5 03/27/2014 1348   K 4.0 02/04/2014 0534   CL 103 02/04/2014 0534   CO2 21* 03/27/2014 1348   CO2 26 02/04/2014 0534   GLUCOSE 93 03/27/2014 1348   GLUCOSE 115* 02/04/2014 0534   BUN 19.3 03/27/2014 1348   BUN 20 02/04/2014 0534   CREATININE 0.9 03/27/2014 1348   CREATININE 1.75* 02/04/2014 0534   CALCIUM 8.3* 03/27/2014 1348   CALCIUM 10.6* 02/04/2014 0534   PROT 5.9* 03/27/2014 1348   PROT 7.5 02/04/2014 0534   ALBUMIN 2.5* 03/27/2014 1348   ALBUMIN 1.9* 02/04/2014 0534   AST 15 03/27/2014 1348   AST 24 02/04/2014 0534   ALT 16 03/27/2014 1348   ALT 18 02/04/2014 0534  ALKPHOS 142 03/27/2014 1348   ALKPHOS 102 02/04/2014 0534   BILITOT <0.20 03/27/2014 1348   BILITOT 0.4 02/04/2014 0534   GFRNONAA 47* 02/04/2014 0534   GFRAA 55* 02/04/2014 0534    INo results found for: SPEP, UPEP  Lab Results  Component Value Date   WBC 9.8 04/07/2014   NEUTROABS 7.1* 04/07/2014   HGB 10.7* 04/07/2014   HCT 32.6* 04/07/2014   MCV 96.6 04/07/2014   PLT 381 04/07/2014      Chemistry      Component Value Date/Time   NA 141 03/27/2014 1348   NA 135 02/04/2014 0534   K 3.5 03/27/2014 1348   K 4.0 02/04/2014 0534   CL 103 02/04/2014 0534   CO2 21* 03/27/2014 1348   CO2 26 02/04/2014 0534   BUN 19.3 03/27/2014 1348   BUN 20 02/04/2014 0534   CREATININE 0.9 03/27/2014 1348   CREATININE 1.75* 02/04/2014 0534      Component Value Date/Time   CALCIUM 8.3* 03/27/2014 1348   CALCIUM 10.6* 02/04/2014 0534   ALKPHOS 142 03/27/2014 1348   ALKPHOS 102 02/04/2014 0534   AST 15  03/27/2014 1348   AST 24 02/04/2014 0534   ALT 16 03/27/2014 1348   ALT 18 02/04/2014 0534   BILITOT <0.20 03/27/2014 1348   BILITOT 0.4 02/04/2014 0534       No results found for: LABCA2  No components found for: LABCA125  No results for input(s): INR in the last 168 hours.  Urinalysis    Component Value Date/Time   COLORURINE YELLOW 01/31/2014 1050   APPEARANCEUR CLOUDY* 01/31/2014 1050   LABSPEC 1.019 01/31/2014 1050   PHURINE 6.0 01/31/2014 1050   GLUCOSEU NEGATIVE 01/31/2014 1050   HGBUR LARGE* 01/31/2014 1050   BILIRUBINUR NEGATIVE 01/31/2014 1050   KETONESUR NEGATIVE 01/31/2014 1050   PROTEINUR 100* 01/31/2014 1050   UROBILINOGEN 0.2 01/31/2014 1050   NITRITE NEGATIVE 01/31/2014 1050   LEUKOCYTESUR NEGATIVE 01/31/2014 1050    STUDIES: No results found.  ASSESSMENT: 39 y.o. 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 The patient is not a transplant candidate  for financial reasons   PLAN: Charles [name] is doing well today. The CBC and CMET from this week are entirely stable. His myeloma specific labs, including the protein creatinine ratio, SPEP, and light chains are all improved from the last lab draw from 1 month ago.   I encouraged Charles [name] to stay hydrated and to use his pain meds as necessary. If not the oxycodone, then tylenol.  I am unsure  of what the clusters to his lips are. He is already on acyclovir five times daily. I have written for a topical acyclovir ointment to apply as as well.   Charles [name] will return this Thursday for day 18 of treatment (cyclophosphamide alone). His next office visit will be on Monday (day 1, cycle 4) where he will be due for bortezomib. He understands and agrees with this plan. He has been encouraged to call with any issues that might arise before her next visit here.   Laurie Panda, NP   04/07/2014 2:28 PM

## 2014-04-10 ENCOUNTER — Other Ambulatory Visit: Payer: Self-pay | Admitting: Nurse Practitioner

## 2014-04-10 ENCOUNTER — Other Ambulatory Visit: Payer: Self-pay | Admitting: Hematology and Oncology

## 2014-04-10 ENCOUNTER — Ambulatory Visit (HOSPITAL_BASED_OUTPATIENT_CLINIC_OR_DEPARTMENT_OTHER): Payer: Self-pay

## 2014-04-10 ENCOUNTER — Encounter: Payer: Self-pay | Admitting: Oncology

## 2014-04-10 DIAGNOSIS — C9000 Multiple myeloma not having achieved remission: Secondary | ICD-10-CM

## 2014-04-10 DIAGNOSIS — M898X9 Other specified disorders of bone, unspecified site: Secondary | ICD-10-CM

## 2014-04-10 DIAGNOSIS — Z5111 Encounter for antineoplastic chemotherapy: Secondary | ICD-10-CM

## 2014-04-10 DIAGNOSIS — M899 Disorder of bone, unspecified: Secondary | ICD-10-CM

## 2014-04-10 MED ORDER — SODIUM CHLORIDE 0.9 % IV SOLN
520.0000 mg | Freq: Once | INTRAVENOUS | Status: AC
  Administered 2014-04-10: 520 mg via INTRAVENOUS
  Filled 2014-04-10: qty 26

## 2014-04-10 MED ORDER — SODIUM CHLORIDE 0.9 % IV SOLN
Freq: Once | INTRAVENOUS | Status: AC
  Administered 2014-04-10: 14:00:00 via INTRAVENOUS

## 2014-04-10 MED ORDER — SODIUM CHLORIDE 0.9 % IV SOLN
Freq: Once | INTRAVENOUS | Status: AC
  Administered 2014-04-10: 15:00:00 via INTRAVENOUS
  Filled 2014-04-10: qty 4

## 2014-04-10 NOTE — Patient Instructions (Signed)
[facility] Discharge Instructions for Patients Receiving Chemotherapy  Today you received the following chemotherapy agents: Cytoxan.   To help prevent nausea and vomiting after your treatment, we encourage you to take your nausea medication as directed.    If you develop nausea and vomiting that is not controlled by your nausea medication, call the clinic.   BELOW ARE SYMPTOMS THAT SHOULD BE REPORTED IMMEDIATELY:  *FEVER GREATER THAN 100.5 F  *CHILLS WITH OR WITHOUT FEVER  NAUSEA AND VOMITING THAT IS NOT CONTROLLED WITH YOUR NAUSEA MEDICATION  *UNUSUAL SHORTNESS OF BREATH  *UNUSUAL BRUISING OR BLEEDING  TENDERNESS IN MOUTH AND THROAT WITH OR WITHOUT PRESENCE OF ULCERS  *URINARY PROBLEMS  *BOWEL PROBLEMS  UNUSUAL RASH Items with * indicate a potential emergency and should be followed up as soon as possible.  Feel free to call the clinic you have any questions or concerns. The clinic phone number is [phone].

## 2014-04-10 NOTE — Progress Notes (Signed)
Gentry Fitz, NP notified that last CMET was done on 2/25. Okay to use this for treatment today 3/10.

## 2014-04-10 NOTE — Progress Notes (Signed)
Spoke to DSS to check Medicaid status.  Rep named Melvenia Beam stated pt was approved and denied on the same day.  She wouldn't tell me anymore than that because there wasn't a consent form signed giving permission to speak to me so she's going to call the pt's wife and explain the situation.

## 2014-04-14 ENCOUNTER — Other Ambulatory Visit (HOSPITAL_BASED_OUTPATIENT_CLINIC_OR_DEPARTMENT_OTHER): Payer: Self-pay

## 2014-04-14 ENCOUNTER — Other Ambulatory Visit: Payer: Self-pay | Admitting: Emergency Medicine

## 2014-04-14 ENCOUNTER — Ambulatory Visit (HOSPITAL_BASED_OUTPATIENT_CLINIC_OR_DEPARTMENT_OTHER): Payer: Self-pay

## 2014-04-14 ENCOUNTER — Encounter: Payer: Self-pay | Admitting: Oncology

## 2014-04-14 ENCOUNTER — Ambulatory Visit (HOSPITAL_BASED_OUTPATIENT_CLINIC_OR_DEPARTMENT_OTHER): Payer: Self-pay | Admitting: Oncology

## 2014-04-14 ENCOUNTER — Other Ambulatory Visit: Payer: Self-pay

## 2014-04-14 VITALS — BP 142/81 | HR 88 | Temp 98.5°F | Resp 18 | Ht 62.0 in | Wt 151.6 lb

## 2014-04-14 DIAGNOSIS — N183 Chronic kidney disease, stage 3 unspecified: Secondary | ICD-10-CM

## 2014-04-14 DIAGNOSIS — C9000 Multiple myeloma not having achieved remission: Secondary | ICD-10-CM

## 2014-04-14 DIAGNOSIS — M899 Disorder of bone, unspecified: Secondary | ICD-10-CM

## 2014-04-14 DIAGNOSIS — D630 Anemia in neoplastic disease: Secondary | ICD-10-CM

## 2014-04-14 DIAGNOSIS — M898X9 Other specified disorders of bone, unspecified site: Secondary | ICD-10-CM

## 2014-04-14 DIAGNOSIS — Z5112 Encounter for antineoplastic immunotherapy: Secondary | ICD-10-CM

## 2014-04-14 LAB — COMPREHENSIVE METABOLIC PANEL (CC13)
ALT: 19 U/L (ref 0–55)
AST: 15 U/L (ref 5–34)
Albumin: 2.9 g/dL — ABNORMAL LOW (ref 3.5–5.0)
Alkaline Phosphatase: 115 U/L (ref 40–150)
Anion Gap: 10 mEq/L (ref 3–11)
BUN: 8.8 mg/dL (ref 7.0–26.0)
CO2: 25 mEq/L (ref 22–29)
CREATININE: 0.8 mg/dL (ref 0.7–1.3)
Calcium: 9 mg/dL (ref 8.4–10.4)
Chloride: 108 mEq/L (ref 98–109)
EGFR: >90 mL/min/{1.73_m2} (ref >90)
Glucose: 81 mg/dl (ref 70–140)
Potassium: 4 mEq/L (ref 3.5–5.1)
Sodium: 143 mEq/L (ref 136–145)
Total Protein: 6.4 g/dL (ref 6.4–8.3)

## 2014-04-14 LAB — CBC WITH DIFFERENTIAL/PLATELET
BASO%: 0.1 % (ref 0.0–2.0)
BASOS ABS: 0 10*3/uL (ref 0.0–0.1)
EOS ABS: 0.1 10*3/uL (ref 0.0–0.5)
EOS%: 1 % (ref 0.0–7.0)
HCT: 31.1 % — ABNORMAL LOW (ref 38.4–49.9)
HGB: 10.4 g/dL — ABNORMAL LOW (ref 13.0–17.1)
LYMPH%: 14.2 % (ref 14.0–49.0)
MCH: 32.5 pg (ref 27.2–33.4)
MCHC: 33.4 g/dL (ref 32.0–36.0)
MCV: 97.2 fL (ref 79.3–98.0)
MONO#: 0.9 10*3/uL (ref 0.1–0.9)
MONO%: 8.6 % (ref 0.0–14.0)
NEUT#: 7.6 10*3/uL — ABNORMAL HIGH (ref 1.5–6.5)
NEUT%: 76.1 % — AB (ref 39.0–75.0)
Platelets: 447 10*3/uL — ABNORMAL HIGH (ref 140–400)
RBC: 3.2 10*6/uL — AB (ref 4.20–5.82)
RDW: 16.6 % — ABNORMAL HIGH (ref 11.0–14.6)
WBC: 10 10*3/uL (ref 4.0–10.3)
lymph#: 1.4 10*3/uL (ref 0.9–3.3)

## 2014-04-14 LAB — LACTATE DEHYDROGENASE (CC13): LDH: 191 U/L (ref 125–245)

## 2014-04-14 MED ORDER — LENALIDOMIDE 25 MG PO CAPS: 25.0000 mg | ORAL_CAPSULE | Freq: Every day | ORAL | Status: DC

## 2014-04-14 MED ORDER — ONDANSETRON HCL 8 MG PO TABS
8.0000 mg | ORAL_TABLET | Freq: Once | ORAL | Status: AC
  Administered 2014-04-14: 8 mg via ORAL

## 2014-04-14 MED ORDER — OXYCODONE HCL 5 MG PO TABS: 5.0000 mg | ORAL_TABLET | Freq: Four times a day (QID) | ORAL | Status: DC | PRN

## 2014-04-14 MED ORDER — BORTEZOMIB CHEMO SQ INJECTION 3.5 MG (2.5MG/ML)
1.3000 mg/m2 | Freq: Once | INTRAMUSCULAR | Status: AC
  Administered 2014-04-14: 2.25 mg via SUBCUTANEOUS
  Filled 2014-04-14: qty 2.25

## 2014-04-14 MED ORDER — ONDANSETRON HCL 8 MG PO TABS
ORAL_TABLET | ORAL | Status: AC
  Filled 2014-04-14: qty 1

## 2014-04-14 MED ORDER — DEXAMETHASONE 4 MG PO TABS: ORAL_TABLET | ORAL | Status: DC

## 2014-04-14 NOTE — Progress Notes (Signed)
Charles [name]  [phone] [phone]     ID: Charles [name] DOB: May 27, 1975  MR#: [id number]  TEL#:[id number]  Patient Care Team: Tresa Garter, MD as PCP - General (Internal Medicine) PCP: [name], MD GYN: SU:  OTHER MD:  CHIEF COMPLAINT: multiple myeloma  CURRENT TREATMENT: bortezomib, cyclophosphamide, dexamethasone, zolendronate  HISTORY  OF MULTIPLE MYELOMA: From the original intake note January 2016:  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 The pain was worse with coughing. Exam was unremarkable, and he was treated with Mobic. No labs were obtained. On 01/30/2014 he presented to the emergency room at [facility] with similar complaints and also reported a 10 pound weight loss over the past month. A chest x-ray was significant only for mild atelectasis, but ACT NGO of the chest 01/30/2014, while it showed no pulmonary emboli, showed an osteolytic lesion destroying the manubrium of the sternum. There was extra osseous extension into the anterior mediastinum.  The patient was admitted and found to have a creatinine of 1.64 with a GFR of 51. Bilateral renal ultrasound showed no hydronephrosis or masses, and normal size and cortical thickness of both kidneys, though there was mild diffuse echogenicity. Urine total protein was 145 mg/dL and immunofixation showed a monoclonal IgG kappa protein as well as monoclonal free kappa light chains. On 01/31/2014 serum protein electrophoresis showed an M spike of 2.22 g/dL with a total protein of 8.3 and albumin 3.4. Kappa lambda light chains from the serum obtained 02/04/2014 showed 10.50 mg/dL on the, 1.74 in the lambda, with an elevated ratio at 6.03. Beta-2 microglobulin was 7.36. LDH was normal at 184 and HIV antibody was nonreactive.  Biopsy of  the manubrial mass 02/03/2014 showed extensive infiltration of the bone by sheets of atypical plasma cells, positive for CD138, kappa restricted (SZA 16-23). Right iliac bone marrow biopsy [name] 05/21/2014 (FZB 16-1) showed an average of 12% plasma cells with numerous aggregates and sheets of atypical plasma cells, which were kappa restricted. Cytogenetic analysis is pending  The patient's subsequent history is as detailed below  INTERVAL HISTORY: Mr.Jose-Gonzalez returns today for follow-up of his multiple myeloma, accompanied by his wife. Today is day 1 cycle 4 of 4 planned cycles of his  Bortezomib/ dexamethasone/ cyclophosphamide treatments, with bortezomib given on days 1, 4, 8, and 11 of every 21 day cycle, dexamethasone 20 mg taken every Tuesday and Wednesday, and cyclophosphamide infused weekly (on Thursdays).   REVIEW OF SYSTEMS: Charles [name] continues to tolerate the treatments remarkably well. In particular he has had no peripheral neuropathy symptoms. He tolerates the dexamethasone with no obvious complaints. He remains however very fatigued. He is unable to work. He has a mild headache at times, which she controls with Tylenol. More recently he has been having pain in his right lower quadrant. He has had minimal nausea, no vomiting. He tells me his eating is okay as is his sense of taste. He also has some back pain. This is not more persistent or intense than before. He does take oxycodone at times to control this. He is not constipated. A detailed review of systems is otherwise stable.  PAST MEDICAL HISTORY: Past Medical History  Diagnosis Date  . IgG myeloma   . CKD (chronic kidney disease), stage III   . Hypercholesterolemia   . Lytic bone lesions on xray  PAST SURGICAL HISTORY: No past surgical history on file.  FAMILY HISTORY No family history on file. The patient's parents are still living, in their late 49's. The patient has 2 brothers, 3 sisters. There is noc cancer in the  fmaily to his knowledge.  SOCIAL HISTORY:  Originally from [facility], moved here 19 years ago. He works in Nurse, adult. Married (wife's name is [name]), lives in [place]. He has 5 children ages 75, 43, 40, 44 and 43 months [name], [name], [name] and [name]) in good health. Best number to call him is [phone]    ADVANCED DIRECTIVES: not  In place   HEALTH MAINTENANCE: History  Substance Use Topics  . Smoking status: Never Smoker   . Smokeless tobacco: Never Used  . Alcohol Use: No     Colonoscopy:  PSA:  Bone density:  Lipid panel:  No Known Allergies  Current Outpatient Prescriptions  Medication Sig Dispense Refill  . acetaminophen (TYLENOL) 325 MG tablet Take 2 tablets (650 mg total) by mouth every 6 (six) hours as needed for mild pain (or Fever >/= 101). (Patient not taking: Reported on 04/07/2014)    . acyclovir (ZOVIRAX) 400 MG tablet Take 1 tablet (400 mg total) by mouth 5 (five) times daily. 60 tablet 6  . acyclovir ointment (ZOVIRAX) 5 % Apply 1 application topically every 3 (three) hours. 15 g 1  . dexamethasone (DECADRON) 4 MG tablet Tome 5 pastillas al desayuno cada martes y miercoles (take 5 tablets w breakfast every Tuesday and wes) 50 tablet 12  . ondansetron (ZOFRAN) 8 MG tablet Take 1 tablet (8 mg total) by mouth 2 (two) times daily. 20 tablet 0  . oxyCODONE (OXY IR/ROXICODONE) 5 MG immediate release tablet Take 1 tablet (5 mg total) by mouth every 6 (six) hours as needed for moderate pain. (Patient not taking: Reported on 04/07/2014) 20 tablet 0   No current facility-administered medications for this visit.    OBJECTIVE: [name] man who in no acute distress Filed Vitals:   04/14/14 1311  BP: 142/81  Pulse: 88  Temp: 98.5 F (36.9 C)  Resp: 18     Body mass index is 27.72 kg/(m^2).    ECOG FS:1 - Symptomatic but completely ambulatory  Sclerae unicteric, pupils equal and reactive Oropharynx clear and moist-- some  missing teeth No cervical or supraclavicular adenopathy Lungs no rales or rhonchi Heart regular rate and rhythm Abd soft, with minimal tenderness to very deep palpation during inspiration in the right lower quadrant MSK no focal spinal tenderness, normal straight leg raising bilaterally Neuro: nonfocal, well oriented, appropriate affect   LAB RESULTS:  CMP     Component Value Date/Time   NA 141 03/27/2014 1348   NA 135 02/04/2014 0534   K 3.5 03/27/2014 1348   K 4.0 02/04/2014 0534   CL 103 02/04/2014 0534   CO2 21* 03/27/2014 1348   CO2 26 02/04/2014 0534   GLUCOSE 93 03/27/2014 1348   GLUCOSE 115* 02/04/2014 0534   BUN 19.3 03/27/2014 1348   BUN 20 02/04/2014 0534   CREATININE 0.9 03/27/2014 1348   CREATININE 1.75* 02/04/2014 0534   CALCIUM 8.3* 03/27/2014 1348   CALCIUM 10.6* 02/04/2014 0534   PROT 5.9* 03/27/2014 1348   PROT 7.5 02/04/2014 0534   ALBUMIN 2.5* 03/27/2014 1348   ALBUMIN 1.9* 02/04/2014 0534   AST 15 03/27/2014 1348   AST 24 02/04/2014 0534   ALT 16 03/27/2014 1348   ALT 18 02/04/2014 0534  ALKPHOS 142 03/27/2014 1348   ALKPHOS 102 02/04/2014 0534   BILITOT <0.20 03/27/2014 1348   BILITOT 0.4 02/04/2014 0534   GFRNONAA 47* 02/04/2014 0534   GFRAA 55* 02/04/2014 0534    INo results found for: SPEP, UPEP  Lab Results  Component Value Date   WBC 10.0 04/14/2014   NEUTROABS 7.6* 04/14/2014   HGB 10.4* 04/14/2014   HCT 31.1* 04/14/2014   MCV 97.2 04/14/2014   PLT 447* 04/14/2014      Chemistry      Component Value Date/Time   NA 141 03/27/2014 1348   NA 135 02/04/2014 0534   K 3.5 03/27/2014 1348   K 4.0 02/04/2014 0534   CL 103 02/04/2014 0534   CO2 21* 03/27/2014 1348   CO2 26 02/04/2014 0534   BUN 19.3 03/27/2014 1348   BUN 20 02/04/2014 0534   CREATININE 0.9 03/27/2014 1348   CREATININE 1.75* 02/04/2014 0534      Component Value Date/Time   CALCIUM 8.3* 03/27/2014 1348   CALCIUM 10.6* 02/04/2014 0534   ALKPHOS 142 03/27/2014  1348   ALKPHOS 102 02/04/2014 0534   AST 15 03/27/2014 1348   AST 24 02/04/2014 0534   ALT 16 03/27/2014 1348   ALT 18 02/04/2014 0534   BILITOT <0.20 03/27/2014 1348   BILITOT 0.4 02/04/2014 0534       No results found for: LABCA2  No components found for: [id number]  No results for input(s): INR in the last 168 hours.  Urinalysis    Component Value Date/Time   COLORURINE YELLOW 01/31/2014 1050   APPEARANCEUR CLOUDY* 01/31/2014 1050   LABSPEC 1.019 01/31/2014 1050   PHURINE 6.0 01/31/2014 1050   GLUCOSEU NEGATIVE 01/31/2014 1050   HGBUR LARGE* 01/31/2014 1050   BILIRUBINUR NEGATIVE 01/31/2014 1050   KETONESUR NEGATIVE 01/31/2014 1050   PROTEINUR 100* 01/31/2014 1050   UROBILINOGEN 0.2 01/31/2014 1050   NITRITE NEGATIVE 01/31/2014 1050   LEUKOCYTESUR NEGATIVE 01/31/2014 1050    STUDIES: No results found.  ASSESSMENT: 39 y.o. 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;        I: CyBorD started January 2016  (a) dexamethasone 20 mg/d every TU/WED started 02/04/2014  (b) bortezomib sQ days 1,4,8,11 of every 21 day cycle started 02/10/2014  (c) cyclophosphamide 300 mg/M2 IV weekly started 02/13/2014  (2) chronic kidney disease stage III  (3) hypercalcemia: zolendronate started 02/10/2014-- repeat every 4 weeks initially, then every 12 weeks  (4) ID prophylaxis:  (a) influenza and Pneumovax [PV13] vaccines 02/07/2014  (b) acyclovir started 02/07/2014  (c) HIV negative 01/31/2014  (d) Pneumovax P23 ro be given after March  2016  (5). The patient is not a transplant candidate for financial reasons   PLAN: Spike is having a very good response to his treatments and I am hoping the labs drawn today will show them to be at or near remission.  Normally I would like to transition him to lenalidomide after 4 cycles of bortezomib/dexamethasone. However it is not clear if we will be able to obtain this medication for him. I put in a request for our financial  advisor and I will also send a note to our Education officer, museum.  The patient does have some kind of Medicaid card which however apparently will not be helpful in this regard.  Quite aside from the myeloma, the patient is unable to work and he has 4 children at home. I have written a letter stating his situation to see if aching get some help from social services.  Otherwise we are proceeding with cycle 4 today. He has a good understanding of the overall plan. He agrees with it. He knows the goal of treatment in his case is control. He will call with any problems that may develop before his next visit here.  [name], MD   04/14/2014 1:15 PM

## 2014-04-14 NOTE — Patient Instructions (Signed)
[facility] Discharge Instructions for Patients Receiving Chemotherapy  Today you received the following chemotherapy agents velcade   To help prevent nausea and vomiting after your treatment, we encourage you to take your nausea medication as directed  If you develop nausea and vomiting that is not controlled by your nausea medication, call the clinic.   BELOW ARE SYMPTOMS THAT SHOULD BE REPORTED IMMEDIATELY:  *FEVER GREATER THAN 100.5 F  *CHILLS WITH OR WITHOUT FEVER  NAUSEA AND VOMITING THAT IS NOT CONTROLLED WITH YOUR NAUSEA MEDICATION  *UNUSUAL SHORTNESS OF BREATH  *UNUSUAL BRUISING OR BLEEDING  TENDERNESS IN MOUTH AND THROAT WITH OR WITHOUT PRESENCE OF ULCERS  *URINARY PROBLEMS  *BOWEL PROBLEMS  UNUSUAL RASH Items with * indicate a potential emergency and should be followed up as soon as possible.  Feel free to call the clinic you have any questions or concerns. The clinic phone number is [phone].  

## 2014-04-14 NOTE — Progress Notes (Signed)
Pt is approved for the $400 [facility] grant.  °

## 2014-04-15 ENCOUNTER — Telehealth: Payer: Self-pay

## 2014-04-15 ENCOUNTER — Other Ambulatory Visit: Payer: Self-pay | Admitting: *Deleted

## 2014-04-15 LAB — PROTEIN / CREATININE RATIO, URINE
CREATININE, URINE: 172.7 mg/dL
PROTEIN CREATININE RATIO: 0.65 — AB (ref <0.15)
TOTAL PROTEIN, URINE: 113 mg/dL — AB (ref 5–25)

## 2014-04-15 NOTE — Progress Notes (Signed)
Per MD request this RN contacted [place] for assistance in obtaining Revlimid for this patient who currently is without insurance or established social security number.  Email sent to [facility] at [place] per above.  Note prescription was sent to [facility] which was returned due to need for additional data which has not been obtained.  This RN returned a fax to [facility] requesting them to disregard the prescription at this time.

## 2014-04-15 NOTE — Telephone Encounter (Signed)
Request for insurance, ICD-10 and celgene auth # to Dr Jana Hakim

## 2014-04-16 LAB — PROTEIN ELECTROPHORESIS, SERUM
ALBUMIN ELP: 52.4 % — AB (ref 55.8–66.1)
ALPHA-2-GLOBULIN: 18.9 % — AB (ref 7.1–11.8)
Alpha-1-Globulin: 7.5 % — ABNORMAL HIGH (ref 2.9–4.9)
BETA 2: 4.2 % (ref 3.2–6.5)
BETA GLOBULIN: 6.2 % (ref 4.7–7.2)
Gamma Globulin: 10.8 % — ABNORMAL LOW (ref 11.1–18.8)
M-Spike, %: 0.36 g/dL
Total Protein, Serum Electrophoresis: 6.5 g/dL (ref 6.0–8.3)

## 2014-04-16 LAB — KAPPA/LAMBDA LIGHT CHAINS
KAPPA LAMBDA RATIO: 2.18 — AB (ref 0.26–1.65)
Kappa free light chain: 3.68 mg/dL — ABNORMAL HIGH (ref 0.33–1.94)
Lambda Free Lght Chn: 1.69 mg/dL (ref 0.57–2.63)

## 2014-04-16 LAB — BETA 2 MICROGLOBULIN, SERUM: Beta-2 Microglobulin: 6.9 mg/L — ABNORMAL HIGH (ref <=2.51)

## 2014-04-17 ENCOUNTER — Ambulatory Visit (HOSPITAL_BASED_OUTPATIENT_CLINIC_OR_DEPARTMENT_OTHER): Payer: Self-pay

## 2014-04-17 DIAGNOSIS — C9000 Multiple myeloma not having achieved remission: Secondary | ICD-10-CM

## 2014-04-17 DIAGNOSIS — M899 Disorder of bone, unspecified: Secondary | ICD-10-CM

## 2014-04-17 DIAGNOSIS — M898X9 Other specified disorders of bone, unspecified site: Secondary | ICD-10-CM

## 2014-04-17 DIAGNOSIS — Z5112 Encounter for antineoplastic immunotherapy: Secondary | ICD-10-CM

## 2014-04-17 MED ORDER — ONDANSETRON HCL 8 MG PO TABS
ORAL_TABLET | ORAL | Status: AC
  Filled 2014-04-17: qty 1

## 2014-04-17 MED ORDER — BORTEZOMIB CHEMO SQ INJECTION 3.5 MG (2.5MG/ML)
1.3000 mg/m2 | Freq: Once | INTRAMUSCULAR | Status: AC
  Administered 2014-04-17: 2.25 mg via SUBCUTANEOUS
  Filled 2014-04-17: qty 2.25

## 2014-04-17 MED ORDER — ONDANSETRON HCL 8 MG PO TABS
8.0000 mg | ORAL_TABLET | Freq: Once | ORAL | Status: AC
  Administered 2014-04-17: 8 mg via ORAL

## 2014-04-17 MED ORDER — SODIUM CHLORIDE 0.9 % IV SOLN
Freq: Once | INTRAVENOUS | Status: AC
  Administered 2014-04-17: 14:00:00 via INTRAVENOUS

## 2014-04-17 MED ORDER — SODIUM CHLORIDE 0.9 % IV SOLN
300.0000 mg/m2 | Freq: Once | INTRAVENOUS | Status: AC
  Administered 2014-04-17: 520 mg via INTRAVENOUS
  Filled 2014-04-17: qty 26

## 2014-04-17 NOTE — Patient Instructions (Signed)
[facility] Discharge Instructions for Patients Receiving Chemotherapy  Today you received the following chemotherapy agents Cytoxan and Velcade. To help prevent nausea and vomiting after your treatment, we encourage you to take your nausea medication as prescribed.   If you develop nausea and vomiting that is not controlled by your nausea medication, call the clinic.   BELOW ARE SYMPTOMS THAT SHOULD BE REPORTED IMMEDIATELY:  *FEVER GREATER THAN 100.5 F  *CHILLS WITH OR WITHOUT FEVER  NAUSEA AND VOMITING THAT IS NOT CONTROLLED WITH YOUR NAUSEA MEDICATION  *UNUSUAL SHORTNESS OF BREATH  *UNUSUAL BRUISING OR BLEEDING  TENDERNESS IN MOUTH AND THROAT WITH OR WITHOUT PRESENCE OF ULCERS  *URINARY PROBLEMS  *BOWEL PROBLEMS  UNUSUAL RASH Items with * indicate a potential emergency and should be followed up as soon as possible.  Feel free to call the clinic you have any questions or concerns. The clinic phone number is [phone].  Please show the [place] at check-in to the Emergency Department and triage nurse.

## 2014-04-24 ENCOUNTER — Ambulatory Visit (HOSPITAL_BASED_OUTPATIENT_CLINIC_OR_DEPARTMENT_OTHER): Payer: Self-pay

## 2014-04-24 ENCOUNTER — Other Ambulatory Visit: Payer: Self-pay | Admitting: *Deleted

## 2014-04-24 ENCOUNTER — Ambulatory Visit: Payer: Self-pay

## 2014-04-24 ENCOUNTER — Telehealth: Payer: Self-pay | Admitting: Oncology

## 2014-04-24 DIAGNOSIS — M898X9 Other specified disorders of bone, unspecified site: Secondary | ICD-10-CM

## 2014-04-24 DIAGNOSIS — M899 Disorder of bone, unspecified: Secondary | ICD-10-CM

## 2014-04-24 DIAGNOSIS — Z5112 Encounter for antineoplastic immunotherapy: Secondary | ICD-10-CM

## 2014-04-24 DIAGNOSIS — C9000 Multiple myeloma not having achieved remission: Secondary | ICD-10-CM

## 2014-04-24 DIAGNOSIS — N183 Chronic kidney disease, stage 3 unspecified: Secondary | ICD-10-CM

## 2014-04-24 DIAGNOSIS — D630 Anemia in neoplastic disease: Secondary | ICD-10-CM

## 2014-04-24 LAB — BASIC METABOLIC PANEL (CC13)
Anion Gap: 9 mEq/L (ref 3–11)
BUN: 18.7 mg/dL (ref 7.0–26.0)
CALCIUM: 8.6 mg/dL (ref 8.4–10.4)
CO2: 23 mEq/L (ref 22–29)
Chloride: 112 mEq/L — ABNORMAL HIGH (ref 98–109)
Creatinine: 1.1 mg/dL (ref 0.7–1.3)
EGFR: 81 mL/min/{1.73_m2} — ABNORMAL LOW (ref >90)
GLUCOSE: 96 mg/dL (ref 70–140)
Potassium: 3.7 mEq/L (ref 3.5–5.1)
SODIUM: 144 meq/L (ref 136–145)

## 2014-04-24 LAB — CBC WITH DIFFERENTIAL/PLATELET
BASO%: 0.3 % (ref 0.0–2.0)
BASOS ABS: 0 10*3/uL (ref 0.0–0.1)
EOS%: 0.1 % (ref 0.0–7.0)
Eosinophils Absolute: 0 10*3/uL (ref 0.0–0.5)
HCT: 29.4 % — ABNORMAL LOW (ref 38.4–49.9)
HGB: 9.9 g/dL — ABNORMAL LOW (ref 13.0–17.1)
LYMPH%: 17.8 % (ref 14.0–49.0)
MCH: 32.8 pg (ref 27.2–33.4)
MCHC: 33.6 g/dL (ref 32.0–36.0)
MCV: 97.7 fL (ref 79.3–98.0)
MONO#: 1.5 10*3/uL — ABNORMAL HIGH (ref 0.1–0.9)
MONO%: 13.9 % (ref 0.0–14.0)
NEUT%: 67.9 % (ref 39.0–75.0)
NEUTROS ABS: 7.4 10*3/uL — AB (ref 1.5–6.5)
PLATELETS: 373 10*3/uL (ref 140–400)
RBC: 3.01 10*6/uL — AB (ref 4.20–5.82)
RDW: 18.5 % — ABNORMAL HIGH (ref 11.0–14.6)
WBC: 10.9 10*3/uL — AB (ref 4.0–10.3)
lymph#: 1.9 10*3/uL (ref 0.9–3.3)

## 2014-04-24 MED ORDER — SODIUM CHLORIDE 0.9 % IV SOLN
300.0000 mg/m2 | Freq: Once | INTRAVENOUS | Status: AC
  Administered 2014-04-24: 520 mg via INTRAVENOUS
  Filled 2014-04-24: qty 26

## 2014-04-24 MED ORDER — ONDANSETRON HCL 8 MG PO TABS
ORAL_TABLET | ORAL | Status: AC
  Filled 2014-04-24: qty 1

## 2014-04-24 MED ORDER — ONDANSETRON HCL 8 MG PO TABS
8.0000 mg | ORAL_TABLET | Freq: Once | ORAL | Status: AC
  Administered 2014-04-24: 8 mg via ORAL

## 2014-04-24 MED ORDER — BORTEZOMIB CHEMO SQ INJECTION 3.5 MG (2.5MG/ML)
1.3000 mg/m2 | Freq: Once | INTRAMUSCULAR | Status: AC
  Administered 2014-04-24: 2.25 mg via SUBCUTANEOUS
  Filled 2014-04-24: qty 2.25

## 2014-04-24 NOTE — Telephone Encounter (Signed)
Left message on wife voicemail  For added labs as the patients voicemail has not been set up.

## 2014-04-24 NOTE — Patient Instructions (Signed)
[facility] Discharge Instructions for Patients Receiving Chemotherapy  Today you received the following chemotherapy agents Velcade/Cytoxan.  To help prevent nausea and vomiting after your treatment, we encourage you to take your nausea medication as prescribed.   If you develop nausea and vomiting that is not controlled by your nausea medication, call the clinic.   BELOW ARE SYMPTOMS THAT SHOULD BE REPORTED IMMEDIATELY:  *FEVER GREATER THAN 100.5 F  *CHILLS WITH OR WITHOUT FEVER  NAUSEA AND VOMITING THAT IS NOT CONTROLLED WITH YOUR NAUSEA MEDICATION  *UNUSUAL SHORTNESS OF BREATH  *UNUSUAL BRUISING OR BLEEDING  TENDERNESS IN MOUTH AND THROAT WITH OR WITHOUT PRESENCE OF ULCERS  *URINARY PROBLEMS  *BOWEL PROBLEMS  UNUSUAL RASH Items with * indicate a potential emergency and should be followed up as soon as possible.  Feel free to call the clinic you have any questions or concerns. The clinic phone number is [phone].  Please show the [place] at check-in to the Emergency Department and triage nurse.

## 2014-04-24 NOTE — Progress Notes (Signed)
Per Dr. Jana Hakim, okay to tx today with CBC only, no CMET needed today. POF placed for lab add-on appt; sent URGENT to scheduler.

## 2014-04-25 ENCOUNTER — Encounter: Payer: Self-pay | Admitting: *Deleted

## 2014-04-25 NOTE — Progress Notes (Signed)
[place] Work  Clinical Social Work was referred by Futures trader for assessment of psychosocial needs.  Clinical Social Worker contacted patient's wife at home to check in and to offer support and assess for needs.  She report pt's MCD is pending and they have applied for other social service assistance. This is all pending per her report at this time. She was aware of the grant approval through [facility] as well. She is aware of CSW assistance available and support programs. CSW will continue to be available as needed.   Clinical Social Work interventions: Resource education Support  [name], [place] Worker [place]  [place] Phone: [phone] Fax: [phone]

## 2014-05-01 ENCOUNTER — Ambulatory Visit (HOSPITAL_BASED_OUTPATIENT_CLINIC_OR_DEPARTMENT_OTHER): Payer: Self-pay

## 2014-05-01 ENCOUNTER — Other Ambulatory Visit (HOSPITAL_BASED_OUTPATIENT_CLINIC_OR_DEPARTMENT_OTHER): Payer: Self-pay

## 2014-05-01 ENCOUNTER — Ambulatory Visit (HOSPITAL_BASED_OUTPATIENT_CLINIC_OR_DEPARTMENT_OTHER): Payer: Self-pay | Admitting: Oncology

## 2014-05-01 ENCOUNTER — Telehealth: Payer: Self-pay | Admitting: Oncology

## 2014-05-01 VITALS — BP 126/76 | HR 63 | Temp 98.1°F | Resp 18 | Ht 62.0 in | Wt 157.0 lb

## 2014-05-01 DIAGNOSIS — Z5111 Encounter for antineoplastic chemotherapy: Secondary | ICD-10-CM

## 2014-05-01 DIAGNOSIS — M899 Disorder of bone, unspecified: Secondary | ICD-10-CM

## 2014-05-01 DIAGNOSIS — N183 Chronic kidney disease, stage 3 unspecified: Secondary | ICD-10-CM

## 2014-05-01 DIAGNOSIS — D630 Anemia in neoplastic disease: Secondary | ICD-10-CM

## 2014-05-01 DIAGNOSIS — C9000 Multiple myeloma not having achieved remission: Secondary | ICD-10-CM

## 2014-05-01 DIAGNOSIS — M898X9 Other specified disorders of bone, unspecified site: Secondary | ICD-10-CM

## 2014-05-01 LAB — CBC WITH DIFFERENTIAL/PLATELET
BASO%: 0.5 % (ref 0.0–2.0)
Basophils Absolute: 0.1 10*3/uL (ref 0.0–0.1)
EOS%: 0.1 % (ref 0.0–7.0)
Eosinophils Absolute: 0 10*3/uL (ref 0.0–0.5)
HEMATOCRIT: 33.2 % — AB (ref 38.4–49.9)
HEMOGLOBIN: 10.7 g/dL — AB (ref 13.0–17.1)
LYMPH#: 1.8 10*3/uL (ref 0.9–3.3)
LYMPH%: 17.8 % (ref 14.0–49.0)
MCH: 32 pg (ref 27.2–33.4)
MCHC: 32.2 g/dL (ref 32.0–36.0)
MCV: 99.2 fL — ABNORMAL HIGH (ref 79.3–98.0)
MONO#: 1.2 10*3/uL — AB (ref 0.1–0.9)
MONO%: 11.9 % (ref 0.0–14.0)
NEUT%: 69.7 % (ref 39.0–75.0)
NEUTROS ABS: 6.9 10*3/uL — AB (ref 1.5–6.5)
PLATELETS: 353 10*3/uL (ref 140–400)
RBC: 3.35 10*6/uL — ABNORMAL LOW (ref 4.20–5.82)
RDW: 19 % — ABNORMAL HIGH (ref 11.0–14.6)
WBC: 9.9 10*3/uL (ref 4.0–10.3)

## 2014-05-01 MED ORDER — SODIUM CHLORIDE 0.9 % IV SOLN
Freq: Once | INTRAVENOUS | Status: AC
  Administered 2014-05-01: 15:00:00 via INTRAVENOUS

## 2014-05-01 MED ORDER — ACYCLOVIR 400 MG PO TABS: 400.0000 mg | ORAL_TABLET | Freq: Two times a day (BID) | ORAL | Status: DC

## 2014-05-01 MED ORDER — SODIUM CHLORIDE 0.9 % IV SOLN
520.0000 mg | Freq: Once | INTRAVENOUS | Status: AC
  Administered 2014-05-01: 520 mg via INTRAVENOUS
  Filled 2014-05-01: qty 26

## 2014-05-01 MED ORDER — SODIUM CHLORIDE 0.9 % IV SOLN
Freq: Once | INTRAVENOUS | Status: AC
  Administered 2014-05-01: 15:00:00 via INTRAVENOUS
  Filled 2014-05-01: qty 4

## 2014-05-01 NOTE — Telephone Encounter (Signed)
Appointments made and avs will be printed in chemo room    anne

## 2014-05-01 NOTE — Progress Notes (Signed)
[facility]  [phone] [phone]     ID: Charles [name] DOB: 1975-07-22  MR#: [id number]  FEX#:[id number]  Patient Care Team: Tresa Garter, MD as PCP - General (Internal Medicine) PCP: [name], MD GYN: SU:  OTHER MD:  CHIEF COMPLAINT: multiple myeloma  CURRENT TREATMENT: bortezomib, cyclophosphamide, dexamethasone, zolendronate  HISTORY  OF MULTIPLE MYELOMA: From the original intake note January 2016:  Charles [name] ([name] is not his first name; 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 The pain was worse with coughing. Exam was unremarkable, and he was treated with Mobic. No labs were obtained. On 01/30/2014 he presented to the emergency room at [facility] with similar complaints and also reported a 10 pound weight loss over the past month. A chest x-ray was significant only for mild atelectasis, but ACT NGO of the chest 01/30/2014, while it showed no pulmonary emboli, showed an osteolytic lesion destroying the manubrium of the sternum. There was extra osseous extension into the anterior mediastinum.  The patient was admitted and found to have a creatinine of 1.64 with a GFR of 51. Bilateral renal ultrasound showed no hydronephrosis or masses, and normal size and cortical thickness of both kidneys, though there was mild diffuse echogenicity. Urine total protein was 145 mg/dL and immunofixation showed a monoclonal IgG kappa protein as well as monoclonal free kappa light chains. On 01/31/2014 serum protein electrophoresis showed an M spike of 2.22 g/dL with a total protein of 8.3 and albumin 3.4. Kappa lambda light chains from the serum obtained 02/04/2014 showed 10.50 mg/dL on the, 1.74 in the lambda, with an elevated ratio at 6.03. Beta-2 microglobulin was 7.36. LDH was normal at 184 and HIV antibody was nonreactive.  Biopsy of  the manubrial mass 02/03/2014 showed extensive infiltration of the bone by sheets of atypical plasma cells, positive for CD138, kappa restricted (SZA 16-23). Right iliac bone marrow biopsy [name] 05/21/2014 (FZB 16-1) showed an average of 12% plasma cells with numerous aggregates and sheets of atypical plasma cells, which were kappa restricted. Cytogenetic analysis is pending  The patient's subsequent history is as detailed below  INTERVAL HISTORY: CharlesPerkins returns today for follow-up of his multiple myeloma, accompanied by his wife. Today is day 18 cycle 4  Bortezomib/ dexamethasone/ cyclophosphamide treatments, with bortezomib given on days 1, 4, 8, and 11 of every 21 day cycle, dexamethasone 20 mg taken every Tuesday and Wednesday, and cyclophosphamide infused weekly (on Thursdays). Today he receives the cyclophosphamide alone  REVIEW OF SYSTEMS: [name] feels a little bit tired for a day or 2 after treatment but then that dissipates. He has minimal nausea, easily controlled with occasional Zofran. He denies any peripheral neuropathy symptoms.there have been no intercurrent infections. A detailed review of systems is otherwise noncontributory  PAST MEDICAL HISTORY: Past Medical History  Diagnosis Date  . IgG myeloma   . CKD (chronic kidney disease), stage III   . Hypercholesterolemia   . Lytic bone lesions on xray     PAST SURGICAL HISTORY: No past surgical history on file.  FAMILY HISTORY No family history on file. The patient's parents are still living, in their late 16's. The patient has 2 brothers, 3 sisters. There is noc cancer in the fmaily to his knowledge.  SOCIAL HISTORY:  Originally from [facility], moved here 19 years ago. He works in Nurse, adult. Married (wife's name is  [name]), lives in [place]. He has 5 children ages 85, 30, 71, 52 and 61 months [name], [name], [name] and [facility]) in good health. Best number to call him is  [phone]    ADVANCED DIRECTIVES: not  In place   HEALTH MAINTENANCE: History  Substance Use Topics  . Smoking status: Never Smoker   . Smokeless tobacco: Never Used  . Alcohol Use: No     Colonoscopy:  PSA:  Bone density:  Lipid panel:  No Known Allergies  Current Outpatient Prescriptions  Medication Sig Dispense Refill  . acetaminophen (TYLENOL) 325 MG tablet Take 2 tablets (650 mg total) by mouth every 6 (six) hours as needed for mild pain (or Fever >/= 101). (Patient not taking: Reported on 04/07/2014)    . acyclovir (ZOVIRAX) 400 MG tablet Take 1 tablet (400 mg total) by mouth 5 (five) times daily. 60 tablet 6  . dexamethasone (DECADRON) 4 MG tablet Tome 5 pastillas al desayuno cada martes y miercoles (take 5 tablets w breakfast every Tuesday and wes) 50 tablet 12  . lenalidomide (REVLIMID) 25 MG capsule Take 1 capsule (25 mg total) by mouth daily. 21 capsule 3  . ondansetron (ZOFRAN) 8 MG tablet Take 1 tablet (8 mg total) by mouth 2 (two) times daily. 20 tablet 0  . oxyCODONE (OXY IR/ROXICODONE) 5 MG immediate release tablet Take 1 tablet (5 mg total) by mouth every 6 (six) hours as needed for moderate pain. 20 tablet 0   No current facility-administered medications for this visit.    OBJECTIVE: [name] man who appears well  Filed Vitals:   05/01/14 1317  BP: 126/76  Pulse: 63  Temp: 98.1 F (36.7 C)  Resp: 18     Body mass index is 28.71 kg/(m^2).    ECOG FS:0 - Asymptomatic  Sclerae unicteric, pupils round and equal Oropharynx clear and moist-- some missing teeth-- no thrush No cervical or supraclavicular adenopathy Lungs no rales or rhonchi Heart regular rate and rhythm Abd soft, nontender, positive bowel sounds MSK no focal spinal tenderness,no joint edema Neuro: nonfocal, well oriented, appropriate affect   LAB RESULTS:  CMP     Component Value Date/Time   NA 144 04/24/2014 1356   NA 135 02/04/2014 0534   K 3.7 04/24/2014 1356   K 4.0  02/04/2014 0534   CL 103 02/04/2014 0534   CO2 23 04/24/2014 1356   CO2 26 02/04/2014 0534   GLUCOSE 96 04/24/2014 1356   GLUCOSE 115* 02/04/2014 0534   BUN 18.7 04/24/2014 1356   BUN 20 02/04/2014 0534   CREATININE 1.1 04/24/2014 1356   CREATININE 1.75* 02/04/2014 0534   CALCIUM 8.6 04/24/2014 1356   CALCIUM 10.6* 02/04/2014 0534   PROT 6.4 04/14/2014 1300   PROT 7.5 02/04/2014 0534   ALBUMIN 2.9* 04/14/2014 1300   ALBUMIN 1.9* 02/04/2014 0534   AST 15 04/14/2014 1300   AST 24 02/04/2014 0534   ALT 19 04/14/2014 1300   ALT 18 02/04/2014 0534   ALKPHOS 115 04/14/2014 1300   ALKPHOS 102 02/04/2014 0534   BILITOT <0.20 04/14/2014 1300   BILITOT 0.4 02/04/2014 0534   GFRNONAA 47* 02/04/2014 0534   GFRAA 55* 02/04/2014 0534    INo results found for: SPEP, UPEP  Lab Results  Component Value Date   WBC 10.9* 04/24/2014   NEUTROABS 7.4* 04/24/2014   HGB 9.9* 04/24/2014   HCT 29.4* 04/24/2014   MCV 97.7 04/24/2014   PLT 373 04/24/2014      Chemistry  Component Value Date/Time   NA 144 04/24/2014 1356   NA 135 02/04/2014 0534   K 3.7 04/24/2014 1356   K 4.0 02/04/2014 0534   CL 103 02/04/2014 0534   CO2 23 04/24/2014 1356   CO2 26 02/04/2014 0534   BUN 18.7 04/24/2014 1356   BUN 20 02/04/2014 0534   CREATININE 1.1 04/24/2014 1356   CREATININE 1.75* 02/04/2014 0534      Component Value Date/Time   CALCIUM 8.6 04/24/2014 1356   CALCIUM 10.6* 02/04/2014 0534   ALKPHOS 115 04/14/2014 1300   ALKPHOS 102 02/04/2014 0534   AST 15 04/14/2014 1300   AST 24 02/04/2014 0534   ALT 19 04/14/2014 1300   ALT 18 02/04/2014 0534   BILITOT <0.20 04/14/2014 1300   BILITOT 0.4 02/04/2014 0534       No results found for: LABCA2  No components found for: LABCA125  No results for input(s): INR in the last 168 hours.  Urinalysis    Component Value Date/Time   COLORURINE YELLOW 01/31/2014 1050   APPEARANCEUR CLOUDY* 01/31/2014 1050   LABSPEC 1.019 01/31/2014 1050    PHURINE 6.0 01/31/2014 1050   GLUCOSEU NEGATIVE 01/31/2014 1050   HGBUR LARGE* 01/31/2014 1050   BILIRUBINUR NEGATIVE 01/31/2014 1050   KETONESUR NEGATIVE 01/31/2014 1050   PROTEINUR 100* 01/31/2014 1050   UROBILINOGEN 0.2 01/31/2014 1050   NITRITE NEGATIVE 01/31/2014 1050   LEUKOCYTESUR NEGATIVE 01/31/2014 1050    STUDIES: No results found.  ASSESSMENT: 39 y.o. 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 The patient is not a transplant candidate for financial reasons   PLAN: Jermany continues to tolerate his myeloma treatment without significant side effects. Importantly, he has had no problems with peripheral neuropathy.he has moderate anemia but otherwise is maintaining his counts well. Repeat labs show continuing decline in his M spike, normalization of the IgG, and improvement in the/lambda ratio.  It would make sense to give him an additional 2 cycles to try to deepen his  response and perhaps get him into a full remission. The question however is what to do from that point.  We have been trying to get an lenalidomide but so far we have not been successful. I do not believe he is a transplant candidate given his overall situation, although medically he would be.  I am asking for advice from a couple of myeloma experts to see if they have some suggestions. Otherwise at this point what I am considering is doing 2 more cycles as just stated and then perhaps  switching to Cytoxan maintenance.  [name], MD   05/01/2014 1:28 PM

## 2014-05-01 NOTE — Progress Notes (Signed)
Addendum: Discuss this patient's situation with Dr. Melba Coon at [facility]. He tells me in the absence of transplant he usually does does 8-12 cycles of the current chemotherapy depending on tolerability and then switches to bortezomib maintenance. We may end up doing that.

## 2014-05-01 NOTE — Patient Instructions (Signed)
[facility] Discharge Instructions for Patients Receiving Chemotherapy  Today you received the following chemotherapy agents: Cytoxan.  To help prevent nausea and vomiting after your treatment, we encourage you to take your nausea medication: Zofran 8 mg every 12 hours as needed.   If you develop nausea and vomiting that is not controlled by your nausea medication, call the clinic.   BELOW ARE SYMPTOMS THAT SHOULD BE REPORTED IMMEDIATELY:  *FEVER GREATER THAN 100.5 F  *CHILLS WITH OR WITHOUT FEVER  NAUSEA AND VOMITING THAT IS NOT CONTROLLED WITH YOUR NAUSEA MEDICATION  *UNUSUAL SHORTNESS OF BREATH  *UNUSUAL BRUISING OR BLEEDING  TENDERNESS IN MOUTH AND THROAT WITH OR WITHOUT PRESENCE OF ULCERS  *URINARY PROBLEMS  *BOWEL PROBLEMS  UNUSUAL RASH Items with * indicate a potential emergency and should be followed up as soon as possible.  Feel free to call the clinic you have any questions or concerns. The clinic phone number is [phone].  Please show the [place] at check-in to the Emergency Department and triage nurse.

## 2014-05-12 ENCOUNTER — Ambulatory Visit (HOSPITAL_BASED_OUTPATIENT_CLINIC_OR_DEPARTMENT_OTHER): Payer: Self-pay

## 2014-05-12 ENCOUNTER — Other Ambulatory Visit (HOSPITAL_BASED_OUTPATIENT_CLINIC_OR_DEPARTMENT_OTHER): Payer: Self-pay

## 2014-05-12 DIAGNOSIS — D630 Anemia in neoplastic disease: Secondary | ICD-10-CM

## 2014-05-12 DIAGNOSIS — N183 Chronic kidney disease, stage 3 unspecified: Secondary | ICD-10-CM

## 2014-05-12 DIAGNOSIS — M899 Disorder of bone, unspecified: Secondary | ICD-10-CM

## 2014-05-12 DIAGNOSIS — Z5112 Encounter for antineoplastic immunotherapy: Secondary | ICD-10-CM

## 2014-05-12 DIAGNOSIS — C9000 Multiple myeloma not having achieved remission: Secondary | ICD-10-CM

## 2014-05-12 LAB — LACTATE DEHYDROGENASE (CC13): LDH: 214 U/L (ref 125–245)

## 2014-05-12 LAB — PROTEIN / CREATININE RATIO, URINE
Creatinine, Urine: 94.1 mg/dL
Protein Creatinine Ratio: 0.62 — ABNORMAL HIGH (ref <0.15)
Total Protein, Urine: 58 mg/dL — ABNORMAL HIGH (ref 5–25)

## 2014-05-12 LAB — CBC WITH DIFFERENTIAL/PLATELET
BASO%: 0.7 % (ref 0.0–2.0)
Basophils Absolute: 0.1 10*3/uL (ref 0.0–0.1)
EOS%: 2.1 % (ref 0.0–7.0)
Eosinophils Absolute: 0.2 10*3/uL (ref 0.0–0.5)
HEMATOCRIT: 33.6 % — AB (ref 38.4–49.9)
HGB: 11.1 g/dL — ABNORMAL LOW (ref 13.0–17.1)
LYMPH%: 15.7 % (ref 14.0–49.0)
MCH: 33 pg (ref 27.2–33.4)
MCHC: 33 g/dL (ref 32.0–36.0)
MCV: 100 fL — ABNORMAL HIGH (ref 79.3–98.0)
MONO#: 0.7 10*3/uL (ref 0.1–0.9)
MONO%: 7.9 % (ref 0.0–14.0)
NEUT#: 6.5 10*3/uL (ref 1.5–6.5)
NEUT%: 73.6 % (ref 39.0–75.0)
Platelets: 383 10*3/uL (ref 140–400)
RBC: 3.36 10*6/uL — AB (ref 4.20–5.82)
RDW: 18.2 % — AB (ref 11.0–14.6)
WBC: 8.8 10*3/uL (ref 4.0–10.3)
lymph#: 1.4 10*3/uL (ref 0.9–3.3)

## 2014-05-12 LAB — COMPREHENSIVE METABOLIC PANEL (CC13)
ALBUMIN: 3.3 g/dL — AB (ref 3.5–5.0)
ALT: 21 U/L (ref 0–55)
ANION GAP: 8 meq/L (ref 3–11)
AST: 19 U/L (ref 5–34)
Alkaline Phosphatase: 149 U/L (ref 40–150)
BUN: 12.4 mg/dL (ref 7.0–26.0)
CALCIUM: 9.1 mg/dL (ref 8.4–10.4)
CO2: 23 meq/L (ref 22–29)
Chloride: 113 mEq/L — ABNORMAL HIGH (ref 98–109)
Creatinine: 0.7 mg/dL (ref 0.7–1.3)
Glucose: 99 mg/dl (ref 70–140)
POTASSIUM: 3.6 meq/L (ref 3.5–5.1)
Sodium: 144 mEq/L (ref 136–145)
TOTAL PROTEIN: 6.3 g/dL — AB (ref 6.4–8.3)
Total Bilirubin: <0.2 mg/dL (ref 0.20–1.20)

## 2014-05-12 MED ORDER — ONDANSETRON HCL 8 MG PO TABS
ORAL_TABLET | ORAL | Status: AC
  Filled 2014-05-12: qty 1

## 2014-05-12 MED ORDER — BORTEZOMIB CHEMO SQ INJECTION 3.5 MG (2.5MG/ML)
1.3000 mg/m2 | Freq: Once | INTRAMUSCULAR | Status: AC
  Administered 2014-05-12: 2.25 mg via SUBCUTANEOUS
  Filled 2014-05-12: qty 2.25

## 2014-05-12 MED ORDER — ONDANSETRON HCL 8 MG PO TABS
8.0000 mg | ORAL_TABLET | Freq: Once | ORAL | Status: AC
  Administered 2014-05-12: 8 mg via ORAL

## 2014-05-12 NOTE — Patient Instructions (Signed)
[facility] Discharge Instructions for Patients Receiving Chemotherapy  Today you received the following chemotherapy agents: velcade  To help prevent nausea and vomiting after your treatment, we encourage you to take your nausea medication.  Take it as often as prescribed.     If you develop nausea and vomiting that is not controlled by your nausea medication, call the clinic. If it is after clinic hours your family physician or the after hours number for the clinic or go to the Emergency Department.   BELOW ARE SYMPTOMS THAT SHOULD BE REPORTED IMMEDIATELY:  *FEVER GREATER THAN 100.5 F  *CHILLS WITH OR WITHOUT FEVER  NAUSEA AND VOMITING THAT IS NOT CONTROLLED WITH YOUR NAUSEA MEDICATION  *UNUSUAL SHORTNESS OF BREATH  *UNUSUAL BRUISING OR BLEEDING  TENDERNESS IN MOUTH AND THROAT WITH OR WITHOUT PRESENCE OF ULCERS  *URINARY PROBLEMS  *BOWEL PROBLEMS  UNUSUAL RASH Items with * indicate a potential emergency and should be followed up as soon as possible.  Feel free to call the clinic you have any questions or concerns. The clinic phone number is [phone].   I have been informed and understand all the instructions given to me. I know to contact the clinic, my physician, or go to the Emergency Department if any problems should occur. I do not have any questions at this time, but understand that I may call the clinic during office hours   should I have any questions or need assistance in obtaining follow up care.    __________________________________________  _____________  __________ Signature of Patient or Authorized Representative            Date                   Time    __________________________________________ Nurse's Signature

## 2014-05-14 LAB — PROTEIN ELECTROPHORESIS, SERUM
ABNORMAL PROTEIN BAND1: 0.3 g/dL
ALPHA-1-GLOBULIN: 0.3 g/dL (ref 0.2–0.3)
Albumin ELP: 3.6 g/dL — ABNORMAL LOW (ref 3.8–4.8)
Alpha-2-Globulin: 1 g/dL — ABNORMAL HIGH (ref 0.5–0.9)
BETA GLOBULIN: 0.4 g/dL (ref 0.4–0.6)
Beta 2: 0.2 g/dL (ref 0.2–0.5)
Gamma Globulin: 0.6 g/dL — ABNORMAL LOW (ref 0.8–1.7)
Total Protein, Serum Electrophoresis: 6.2 g/dL (ref 6.1–8.1)

## 2014-05-14 LAB — IGG, IGA, IGM
IgA: 87 mg/dL (ref 68–379)
IgG (Immunoglobin G), Serum: 650 mg/dL (ref 650–1600)
IgM, Serum: 57 mg/dL (ref 41–251)

## 2014-05-14 LAB — KAPPA/LAMBDA LIGHT CHAINS
KAPPA LAMBDA RATIO: 1.74 — AB (ref 0.26–1.65)
Kappa free light chain: 3.01 mg/dL — ABNORMAL HIGH (ref 0.33–1.94)
Lambda Free Lght Chn: 1.73 mg/dL (ref 0.57–2.63)

## 2014-05-15 ENCOUNTER — Ambulatory Visit (HOSPITAL_BASED_OUTPATIENT_CLINIC_OR_DEPARTMENT_OTHER): Payer: Self-pay

## 2014-05-15 VITALS — BP 121/64 | HR 72 | Temp 98.3°F | Resp 16

## 2014-05-15 DIAGNOSIS — M899 Disorder of bone, unspecified: Secondary | ICD-10-CM

## 2014-05-15 DIAGNOSIS — Z5112 Encounter for antineoplastic immunotherapy: Secondary | ICD-10-CM

## 2014-05-15 DIAGNOSIS — Z5111 Encounter for antineoplastic chemotherapy: Secondary | ICD-10-CM

## 2014-05-15 DIAGNOSIS — C9000 Multiple myeloma not having achieved remission: Secondary | ICD-10-CM

## 2014-05-15 MED ORDER — CYCLOPHOSPHAMIDE CHEMO INJECTION 1 GM
300.0000 mg/m2 | Freq: Once | INTRAMUSCULAR | Status: AC
  Administered 2014-05-15: 520 mg via INTRAVENOUS
  Filled 2014-05-15: qty 26

## 2014-05-15 MED ORDER — BORTEZOMIB CHEMO SQ INJECTION 3.5 MG (2.5MG/ML)
1.3000 mg/m2 | Freq: Once | INTRAMUSCULAR | Status: AC
  Administered 2014-05-15: 2.25 mg via SUBCUTANEOUS
  Filled 2014-05-15: qty 2.25

## 2014-05-15 MED ORDER — ONDANSETRON HCL 8 MG PO TABS
8.0000 mg | ORAL_TABLET | Freq: Once | ORAL | Status: AC
  Administered 2014-05-15: 8 mg via ORAL

## 2014-05-15 NOTE — Patient Instructions (Signed)
[facility] Discharge Instructions for Patients Receiving Chemotherapy  Today you received the following chemotherapy agents: Velcade and Cytoxan   To help prevent nausea and vomiting after your treatment, we encourage you to take your nausea medication as prescrobed.    If you develop nausea and vomiting that is not controlled by your nausea medication, call the clinic.   BELOW ARE SYMPTOMS THAT SHOULD BE REPORTED IMMEDIATELY:  *FEVER GREATER THAN 100.5 F  *CHILLS WITH OR WITHOUT FEVER  NAUSEA AND VOMITING THAT IS NOT CONTROLLED WITH YOUR NAUSEA MEDICATION  *UNUSUAL SHORTNESS OF BREATH  *UNUSUAL BRUISING OR BLEEDING  TENDERNESS IN MOUTH AND THROAT WITH OR WITHOUT PRESENCE OF ULCERS  *URINARY PROBLEMS  *BOWEL PROBLEMS  UNUSUAL RASH Items with * indicate a potential emergency and should be followed up as soon as possible.  Feel free to call the clinic you have any questions or concerns. The clinic phone number is [phone].  Please show the [place] at check-in to the Emergency Department and triage nurse.

## 2014-05-19 ENCOUNTER — Other Ambulatory Visit (HOSPITAL_BASED_OUTPATIENT_CLINIC_OR_DEPARTMENT_OTHER): Payer: Self-pay

## 2014-05-19 ENCOUNTER — Ambulatory Visit (HOSPITAL_BASED_OUTPATIENT_CLINIC_OR_DEPARTMENT_OTHER): Payer: Self-pay

## 2014-05-19 ENCOUNTER — Other Ambulatory Visit: Payer: Self-pay | Admitting: *Deleted

## 2014-05-19 VITALS — BP 130/70 | HR 70 | Temp 97.9°F

## 2014-05-19 DIAGNOSIS — Z5112 Encounter for antineoplastic immunotherapy: Secondary | ICD-10-CM

## 2014-05-19 DIAGNOSIS — C9000 Multiple myeloma not having achieved remission: Secondary | ICD-10-CM

## 2014-05-19 DIAGNOSIS — M899 Disorder of bone, unspecified: Secondary | ICD-10-CM

## 2014-05-19 DIAGNOSIS — N183 Chronic kidney disease, stage 3 unspecified: Secondary | ICD-10-CM

## 2014-05-19 DIAGNOSIS — D630 Anemia in neoplastic disease: Secondary | ICD-10-CM

## 2014-05-19 LAB — CBC WITH DIFFERENTIAL/PLATELET
BASO%: 0.4 % (ref 0.0–2.0)
Basophils Absolute: 0 10*3/uL (ref 0.0–0.1)
EOS%: 1.7 % (ref 0.0–7.0)
Eosinophils Absolute: 0.2 10*3/uL (ref 0.0–0.5)
HCT: 36.4 % — ABNORMAL LOW (ref 38.4–49.9)
HEMOGLOBIN: 11.8 g/dL — AB (ref 13.0–17.1)
LYMPH#: 1.6 10*3/uL (ref 0.9–3.3)
LYMPH%: 15 % (ref 14.0–49.0)
MCH: 32.7 pg (ref 27.2–33.4)
MCHC: 32.6 g/dL (ref 32.0–36.0)
MCV: 100.5 fL — AB (ref 79.3–98.0)
MONO#: 0.9 10*3/uL (ref 0.1–0.9)
MONO%: 8.8 % (ref 0.0–14.0)
NEUT%: 74.1 % (ref 39.0–75.0)
NEUTROS ABS: 7.8 10*3/uL — AB (ref 1.5–6.5)
Platelets: 357 10*3/uL (ref 140–400)
RBC: 3.62 10*6/uL — ABNORMAL LOW (ref 4.20–5.82)
RDW: 16.9 % — AB (ref 11.0–14.6)
WBC: 10.5 10*3/uL — AB (ref 4.0–10.3)

## 2014-05-19 MED ORDER — BORTEZOMIB CHEMO SQ INJECTION 3.5 MG (2.5MG/ML)
1.3000 mg/m2 | Freq: Once | INTRAMUSCULAR | Status: AC
  Administered 2014-05-19: 2.25 mg via SUBCUTANEOUS
  Filled 2014-05-19: qty 2.25

## 2014-05-19 MED ORDER — ONDANSETRON HCL 8 MG PO TABS
8.0000 mg | ORAL_TABLET | Freq: Once | ORAL | Status: AC
  Administered 2014-05-19: 8 mg via ORAL

## 2014-05-19 MED ORDER — ONDANSETRON HCL 8 MG PO TABS
ORAL_TABLET | ORAL | Status: AC
  Filled 2014-05-19: qty 1

## 2014-05-19 NOTE — Patient Instructions (Signed)
[facility] Discharge Instructions for Patients Receiving Chemotherapy  Today you received the following chemotherapy agents Velcade To help prevent nausea and vomiting after your treatment, we encourage you to take your nausea medication as prescribed.If you develop nausea and vomiting that is not controlled by your nausea medication, call the clinic.   BELOW ARE SYMPTOMS THAT SHOULD BE REPORTED IMMEDIATELY:  *FEVER GREATER THAN 100.5 F  *CHILLS WITH OR WITHOUT FEVER  NAUSEA AND VOMITING THAT IS NOT CONTROLLED WITH YOUR NAUSEA MEDICATION  *UNUSUAL SHORTNESS OF BREATH  *UNUSUAL BRUISING OR BLEEDING  TENDERNESS IN MOUTH AND THROAT WITH OR WITHOUT PRESENCE OF ULCERS  *URINARY PROBLEMS  *BOWEL PROBLEMS  UNUSUAL RASH Items with * indicate a potential emergency and should be followed up as soon as possible.  Feel free to call the clinic you have any questions or concerns. The clinic phone number is [phone].  Please show the [place] at check-in to the Emergency Department and triage nurse.

## 2014-05-19 NOTE — Progress Notes (Signed)
Ok to treat today without CMET per Dr. Jana Hakim.

## 2014-05-22 ENCOUNTER — Other Ambulatory Visit (HOSPITAL_BASED_OUTPATIENT_CLINIC_OR_DEPARTMENT_OTHER): Payer: Self-pay

## 2014-05-22 ENCOUNTER — Ambulatory Visit (HOSPITAL_BASED_OUTPATIENT_CLINIC_OR_DEPARTMENT_OTHER): Payer: Self-pay

## 2014-05-22 ENCOUNTER — Other Ambulatory Visit: Payer: Self-pay | Admitting: *Deleted

## 2014-05-22 VITALS — BP 120/66 | HR 56 | Temp 97.8°F | Resp 22

## 2014-05-22 DIAGNOSIS — Z5112 Encounter for antineoplastic immunotherapy: Secondary | ICD-10-CM

## 2014-05-22 DIAGNOSIS — M899 Disorder of bone, unspecified: Secondary | ICD-10-CM

## 2014-05-22 DIAGNOSIS — C9000 Multiple myeloma not having achieved remission: Secondary | ICD-10-CM

## 2014-05-22 DIAGNOSIS — Z5111 Encounter for antineoplastic chemotherapy: Secondary | ICD-10-CM

## 2014-05-22 LAB — CBC WITH DIFFERENTIAL/PLATELET
BASO%: 0.2 % (ref 0.0–2.0)
BASOS ABS: 0 10*3/uL (ref 0.0–0.1)
EOS%: 0.1 % (ref 0.0–7.0)
Eosinophils Absolute: 0 10*3/uL (ref 0.0–0.5)
HEMATOCRIT: 33.7 % — AB (ref 38.4–49.9)
HGB: 11 g/dL — ABNORMAL LOW (ref 13.0–17.1)
LYMPH%: 19.7 % (ref 14.0–49.0)
MCH: 32.9 pg (ref 27.2–33.4)
MCHC: 32.7 g/dL (ref 32.0–36.0)
MCV: 100.7 fL — ABNORMAL HIGH (ref 79.3–98.0)
MONO#: 1.5 10*3/uL — AB (ref 0.1–0.9)
MONO%: 12.3 % (ref 0.0–14.0)
NEUT%: 67.7 % (ref 39.0–75.0)
NEUTROS ABS: 8.4 10*3/uL — AB (ref 1.5–6.5)
Platelets: 337 10*3/uL (ref 140–400)
RBC: 3.35 10*6/uL — AB (ref 4.20–5.82)
RDW: 17.4 % — ABNORMAL HIGH (ref 11.0–14.6)
WBC: 12.4 10*3/uL — AB (ref 4.0–10.3)
lymph#: 2.4 10*3/uL (ref 0.9–3.3)

## 2014-05-22 LAB — COMPREHENSIVE METABOLIC PANEL (CC13)
ALBUMIN: 3.3 g/dL — AB (ref 3.5–5.0)
ALT: 18 U/L (ref 0–55)
ANION GAP: 13 meq/L — AB (ref 3–11)
AST: 15 U/L (ref 5–34)
Alkaline Phosphatase: 118 U/L (ref 40–150)
BUN: 17.2 mg/dL (ref 7.0–26.0)
CALCIUM: 8.5 mg/dL (ref 8.4–10.4)
CO2: 23 meq/L (ref 22–29)
CREATININE: 0.7 mg/dL (ref 0.7–1.3)
Chloride: 110 mEq/L — ABNORMAL HIGH (ref 98–109)
EGFR: >90 mL/min/{1.73_m2} (ref >90)
Glucose: 104 mg/dl (ref 70–140)
Potassium: 3.5 mEq/L (ref 3.5–5.1)
SODIUM: 146 meq/L — AB (ref 136–145)
TOTAL PROTEIN: 5.9 g/dL — AB (ref 6.4–8.3)
Total Bilirubin: <0.2 mg/dL (ref 0.20–1.20)

## 2014-05-22 MED ORDER — ZOLEDRONIC ACID 4 MG/100ML IV SOLN
4.0000 mg | Freq: Once | INTRAVENOUS | Status: AC
  Administered 2014-05-22: 4 mg via INTRAVENOUS
  Filled 2014-05-22: qty 100

## 2014-05-22 MED ORDER — BORTEZOMIB CHEMO SQ INJECTION 3.5 MG (2.5MG/ML)
1.3000 mg/m2 | Freq: Once | INTRAMUSCULAR | Status: AC
  Administered 2014-05-22: 2.25 mg via SUBCUTANEOUS
  Filled 2014-05-22: qty 2.25

## 2014-05-22 MED ORDER — ONDANSETRON HCL 8 MG PO TABS
8.0000 mg | ORAL_TABLET | Freq: Once | ORAL | Status: AC
  Administered 2014-05-22: 8 mg via ORAL

## 2014-05-22 MED ORDER — ONDANSETRON HCL 8 MG PO TABS
ORAL_TABLET | ORAL | Status: AC
  Filled 2014-05-22: qty 1

## 2014-05-22 MED ORDER — SODIUM CHLORIDE 0.9 % IV SOLN
Freq: Once | INTRAVENOUS | Status: AC
  Administered 2014-05-22: 16:00:00 via INTRAVENOUS

## 2014-05-22 MED ORDER — SODIUM CHLORIDE 0.9 % IV SOLN
300.0000 mg/m2 | Freq: Once | INTRAVENOUS | Status: AC
  Administered 2014-05-22: 520 mg via INTRAVENOUS
  Filled 2014-05-22: qty 26

## 2014-05-22 NOTE — Patient Instructions (Signed)
[facility] Discharge Instructions for Patients Receiving Chemotherapy  Today you received the following chemotherapy agents: Velcade and Cytoxan You also received zometa today.   To help prevent nausea and vomiting after your treatment, we encourage you to take your nausea medication as prescrobed.    If you develop nausea and vomiting that is not controlled by your nausea medication, call the clinic.   BELOW ARE SYMPTOMS THAT SHOULD BE REPORTED IMMEDIATELY:  *FEVER GREATER THAN 100.5 F  *CHILLS WITH OR WITHOUT FEVER  NAUSEA AND VOMITING THAT IS NOT CONTROLLED WITH YOUR NAUSEA MEDICATION  *UNUSUAL SHORTNESS OF BREATH  *UNUSUAL BRUISING OR BLEEDING  TENDERNESS IN MOUTH AND THROAT WITH OR WITHOUT PRESENCE OF ULCERS  *URINARY PROBLEMS  *BOWEL PROBLEMS  UNUSUAL RASH Items with * indicate a potential emergency and should be followed up as soon as possible.  Feel free to call the clinic you have any questions or concerns. The clinic phone number is [phone].  Please show the [place] at check-in to the Emergency Department and triage nurse.

## 2014-06-02 ENCOUNTER — Other Ambulatory Visit (HOSPITAL_BASED_OUTPATIENT_CLINIC_OR_DEPARTMENT_OTHER): Payer: Self-pay

## 2014-06-02 ENCOUNTER — Ambulatory Visit (HOSPITAL_BASED_OUTPATIENT_CLINIC_OR_DEPARTMENT_OTHER): Payer: Self-pay

## 2014-06-02 VITALS — BP 119/73 | HR 65 | Temp 98.1°F | Resp 18

## 2014-06-02 DIAGNOSIS — N183 Chronic kidney disease, stage 3 unspecified: Secondary | ICD-10-CM

## 2014-06-02 DIAGNOSIS — C9000 Multiple myeloma not having achieved remission: Secondary | ICD-10-CM

## 2014-06-02 DIAGNOSIS — M899 Disorder of bone, unspecified: Secondary | ICD-10-CM

## 2014-06-02 DIAGNOSIS — Z5112 Encounter for antineoplastic immunotherapy: Secondary | ICD-10-CM

## 2014-06-02 DIAGNOSIS — D630 Anemia in neoplastic disease: Secondary | ICD-10-CM

## 2014-06-02 LAB — CBC WITH DIFFERENTIAL/PLATELET
BASO%: 0.5 % (ref 0.0–2.0)
Basophils Absolute: 0 10*3/uL (ref 0.0–0.1)
EOS ABS: 0.1 10*3/uL (ref 0.0–0.5)
EOS%: 1.4 % (ref 0.0–7.0)
HEMATOCRIT: 35.5 % — AB (ref 38.4–49.9)
HEMOGLOBIN: 12 g/dL — AB (ref 13.0–17.1)
LYMPH#: 1.8 10*3/uL (ref 0.9–3.3)
LYMPH%: 20.4 % (ref 14.0–49.0)
MCH: 33.7 pg — AB (ref 27.2–33.4)
MCHC: 33.7 g/dL (ref 32.0–36.0)
MCV: 100 fL — AB (ref 79.3–98.0)
MONO#: 0.7 10*3/uL (ref 0.1–0.9)
MONO%: 7.7 % (ref 0.0–14.0)
NEUT#: 6 10*3/uL (ref 1.5–6.5)
NEUT%: 70 % (ref 39.0–75.0)
PLATELETS: 417 10*3/uL — AB (ref 140–400)
RBC: 3.55 10*6/uL — ABNORMAL LOW (ref 4.20–5.82)
RDW: 15.6 % — AB (ref 11.0–14.6)
WBC: 8.6 10*3/uL (ref 4.0–10.3)

## 2014-06-02 LAB — COMPREHENSIVE METABOLIC PANEL (CC13)
ALBUMIN: 3 g/dL — AB (ref 3.5–5.0)
ALK PHOS: 112 U/L (ref 40–150)
ALT: 17 U/L (ref 0–55)
ANION GAP: 10 meq/L (ref 3–11)
AST: 19 U/L (ref 5–34)
BUN: 12.2 mg/dL (ref 7.0–26.0)
CO2: 21 mEq/L — ABNORMAL LOW (ref 22–29)
Calcium: 9.1 mg/dL (ref 8.4–10.4)
Chloride: 112 mEq/L — ABNORMAL HIGH (ref 98–109)
Creatinine: 0.7 mg/dL (ref 0.7–1.3)
GLUCOSE: 105 mg/dL (ref 70–140)
Potassium: 3.6 mEq/L (ref 3.5–5.1)
SODIUM: 143 meq/L (ref 136–145)
TOTAL PROTEIN: 6.6 g/dL (ref 6.4–8.3)
Total Bilirubin: <0.2 mg/dL (ref 0.20–1.20)

## 2014-06-02 LAB — LACTATE DEHYDROGENASE (CC13): LDH: 181 U/L (ref 125–245)

## 2014-06-02 MED ORDER — ONDANSETRON HCL 8 MG PO TABS
ORAL_TABLET | ORAL | Status: AC
  Filled 2014-06-02: qty 1

## 2014-06-02 MED ORDER — ONDANSETRON HCL 8 MG PO TABS
8.0000 mg | ORAL_TABLET | Freq: Once | ORAL | Status: AC
  Administered 2014-06-02: 8 mg via ORAL

## 2014-06-02 MED ORDER — BORTEZOMIB CHEMO SQ INJECTION 3.5 MG (2.5MG/ML)
1.3000 mg/m2 | Freq: Once | INTRAMUSCULAR | Status: AC
  Administered 2014-06-02: 2.25 mg via SUBCUTANEOUS
  Filled 2014-06-02: qty 2.25

## 2014-06-02 NOTE — Patient Instructions (Signed)
[facility] Discharge Instructions for Patients Receiving Chemotherapy  Today you received the following chemotherapy agent: Velcade   To help prevent nausea and vomiting after your treatment, we encourage you to take your nausea medication as prescribed.  You received Zofran for nausea at 3:30. You may take this again in approximately 8 hours   If you develop nausea and vomiting that is not controlled by your nausea medication, call the clinic.   BELOW ARE SYMPTOMS THAT SHOULD BE REPORTED IMMEDIATELY:  *FEVER GREATER THAN 100.5 F  *CHILLS WITH OR WITHOUT FEVER  NAUSEA AND VOMITING THAT IS NOT CONTROLLED WITH YOUR NAUSEA MEDICATION  *UNUSUAL SHORTNESS OF BREATH  *UNUSUAL BRUISING OR BLEEDING  TENDERNESS IN MOUTH AND THROAT WITH OR WITHOUT PRESENCE OF ULCERS  *URINARY PROBLEMS  *BOWEL PROBLEMS  UNUSUAL RASH Items with * indicate a potential emergency and should be followed up as soon as possible.  Feel free to call the clinic you have any questions or concerns. The clinic phone number is [phone].  Please show the [place] at check-in to the Emergency Department and triage nurse.

## 2014-06-03 LAB — PROTEIN / CREATININE RATIO, URINE
CREATININE, URINE: 239.4 mg/dL
Protein Creatinine Ratio: 0.67 — ABNORMAL HIGH (ref <0.15)
Total Protein, Urine: 160 mg/dL — ABNORMAL HIGH (ref 5–25)

## 2014-06-04 LAB — KAPPA/LAMBDA LIGHT CHAINS
KAPPA LAMBDA RATIO: 1.24 (ref 0.26–1.65)
Kappa free light chain: 3.22 mg/dL — ABNORMAL HIGH (ref 0.33–1.94)
Lambda Free Lght Chn: 2.59 mg/dL (ref 0.57–2.63)

## 2014-06-04 LAB — PROTEIN ELECTROPHORESIS, SERUM
ABNORMAL PROTEIN BAND1: 0.2 g/dL
ALPHA-2-GLOBULIN: 1 g/dL — AB (ref 0.5–0.9)
Albumin ELP: 3.2 g/dL — ABNORMAL LOW (ref 3.8–4.8)
Alpha-1-Globulin: 0.4 g/dL — ABNORMAL HIGH (ref 0.2–0.3)
BETA GLOBULIN: 0.3 g/dL — AB (ref 0.4–0.6)
Beta 2: 0.3 g/dL (ref 0.2–0.5)
GAMMA GLOBULIN: 0.6 g/dL — AB (ref 0.8–1.7)
Total Protein, Serum Electrophoresis: 5.8 g/dL — ABNORMAL LOW (ref 6.1–8.1)

## 2014-06-04 LAB — BETA 2 MICROGLOBULIN, SERUM: Beta-2 Microglobulin: 2.63 mg/L — ABNORMAL HIGH (ref <=2.51)

## 2014-06-05 ENCOUNTER — Ambulatory Visit (HOSPITAL_BASED_OUTPATIENT_CLINIC_OR_DEPARTMENT_OTHER): Payer: Self-pay

## 2014-06-05 VITALS — BP 121/69 | HR 63 | Temp 97.0°F | Resp 18

## 2014-06-05 DIAGNOSIS — C9000 Multiple myeloma not having achieved remission: Secondary | ICD-10-CM

## 2014-06-05 DIAGNOSIS — M899 Disorder of bone, unspecified: Secondary | ICD-10-CM

## 2014-06-05 DIAGNOSIS — Z5111 Encounter for antineoplastic chemotherapy: Secondary | ICD-10-CM

## 2014-06-05 MED ORDER — SODIUM CHLORIDE 0.9 % IV SOLN
Freq: Once | INTRAVENOUS | Status: AC
  Administered 2014-06-05: 16:00:00 via INTRAVENOUS

## 2014-06-05 MED ORDER — SODIUM CHLORIDE 0.9 % IV SOLN
300.0000 mg/m2 | Freq: Once | INTRAVENOUS | Status: AC
  Administered 2014-06-05: 520 mg via INTRAVENOUS
  Filled 2014-06-05: qty 26

## 2014-06-05 MED ORDER — ONDANSETRON HCL 8 MG PO TABS
ORAL_TABLET | ORAL | Status: AC
  Filled 2014-06-05: qty 1

## 2014-06-05 MED ORDER — ONDANSETRON HCL 8 MG PO TABS
8.0000 mg | ORAL_TABLET | Freq: Once | ORAL | Status: AC
  Administered 2014-06-05: 8 mg via ORAL

## 2014-06-05 MED ORDER — BORTEZOMIB CHEMO SQ INJECTION 3.5 MG (2.5MG/ML)
1.3000 mg/m2 | Freq: Once | INTRAMUSCULAR | Status: AC
  Administered 2014-06-05: 2.25 mg via SUBCUTANEOUS
  Filled 2014-06-05: qty 2.25

## 2014-06-05 NOTE — Patient Instructions (Signed)
[facility] Discharge Instructions for Patients Receiving Chemotherapy  Today you received the following chemotherapy agents Cytoxan and Velcade. To help prevent nausea and vomiting after your treatment, we encourage you to take your nausea medication as prescribed.   If you develop nausea and vomiting that is not controlled by your nausea medication, call the clinic.   BELOW ARE SYMPTOMS THAT SHOULD BE REPORTED IMMEDIATELY:  *FEVER GREATER THAN 100.5 F  *CHILLS WITH OR WITHOUT FEVER  NAUSEA AND VOMITING THAT IS NOT CONTROLLED WITH YOUR NAUSEA MEDICATION  *UNUSUAL SHORTNESS OF BREATH  *UNUSUAL BRUISING OR BLEEDING  TENDERNESS IN MOUTH AND THROAT WITH OR WITHOUT PRESENCE OF ULCERS  *URINARY PROBLEMS  *BOWEL PROBLEMS  UNUSUAL RASH Items with * indicate a potential emergency and should be followed up as soon as possible.  Feel free to call the clinic you have any questions or concerns. The clinic phone number is [phone].  Please show the CHEMO ALERT CARD at check-in to the Emergency Department and triage nurse.   

## 2014-06-09 ENCOUNTER — Ambulatory Visit (HOSPITAL_BASED_OUTPATIENT_CLINIC_OR_DEPARTMENT_OTHER): Payer: Self-pay

## 2014-06-09 ENCOUNTER — Other Ambulatory Visit (HOSPITAL_BASED_OUTPATIENT_CLINIC_OR_DEPARTMENT_OTHER): Payer: Self-pay

## 2014-06-09 ENCOUNTER — Telehealth: Payer: Self-pay | Admitting: Oncology

## 2014-06-09 ENCOUNTER — Other Ambulatory Visit: Payer: Self-pay | Admitting: *Deleted

## 2014-06-09 VITALS — BP 124/71 | HR 76 | Temp 97.1°F | Resp 19

## 2014-06-09 DIAGNOSIS — N183 Chronic kidney disease, stage 3 unspecified: Secondary | ICD-10-CM

## 2014-06-09 DIAGNOSIS — Z5112 Encounter for antineoplastic immunotherapy: Secondary | ICD-10-CM

## 2014-06-09 DIAGNOSIS — D630 Anemia in neoplastic disease: Secondary | ICD-10-CM

## 2014-06-09 DIAGNOSIS — M899 Disorder of bone, unspecified: Secondary | ICD-10-CM

## 2014-06-09 DIAGNOSIS — C9000 Multiple myeloma not having achieved remission: Secondary | ICD-10-CM

## 2014-06-09 LAB — CBC WITH DIFFERENTIAL/PLATELET
BASO%: 0.1 % (ref 0.0–2.0)
BASOS ABS: 0 10*3/uL (ref 0.0–0.1)
EOS ABS: 0 10*3/uL (ref 0.0–0.5)
EOS%: 0.3 % (ref 0.0–7.0)
HEMATOCRIT: 34.9 % — AB (ref 38.4–49.9)
HEMOGLOBIN: 11.7 g/dL — AB (ref 13.0–17.1)
LYMPH#: 1.7 10*3/uL (ref 0.9–3.3)
LYMPH%: 16 % (ref 14.0–49.0)
MCH: 33.8 pg — ABNORMAL HIGH (ref 27.2–33.4)
MCHC: 33.5 g/dL (ref 32.0–36.0)
MCV: 100.9 fL — AB (ref 79.3–98.0)
MONO#: 0.8 10*3/uL (ref 0.1–0.9)
MONO%: 8 % (ref 0.0–14.0)
NEUT#: 7.9 10*3/uL — ABNORMAL HIGH (ref 1.5–6.5)
NEUT%: 75.6 % — ABNORMAL HIGH (ref 39.0–75.0)
Platelets: 410 10*3/uL — ABNORMAL HIGH (ref 140–400)
RBC: 3.46 10*6/uL — ABNORMAL LOW (ref 4.20–5.82)
RDW: 14 % (ref 11.0–14.6)
WBC: 10.4 10*3/uL — ABNORMAL HIGH (ref 4.0–10.3)

## 2014-06-09 MED ORDER — ONDANSETRON HCL 8 MG PO TABS
ORAL_TABLET | ORAL | Status: AC
  Filled 2014-06-09: qty 1

## 2014-06-09 MED ORDER — BORTEZOMIB CHEMO SQ INJECTION 3.5 MG (2.5MG/ML)
1.3000 mg/m2 | Freq: Once | INTRAMUSCULAR | Status: AC
  Administered 2014-06-09: 2.25 mg via SUBCUTANEOUS
  Filled 2014-06-09: qty 2.25

## 2014-06-09 MED ORDER — ONDANSETRON HCL 8 MG PO TABS
8.0000 mg | ORAL_TABLET | Freq: Once | ORAL | Status: AC
  Administered 2014-06-09: 8 mg via ORAL

## 2014-06-09 NOTE — Progress Notes (Signed)
Per Dr. Jana Hakim, okay to tx with CBC results only, no CMET needed today.

## 2014-06-09 NOTE — Patient Instructions (Signed)
[facility] Discharge Instructions for Patients Receiving Chemotherapy  Today you received the following chemotherapy agents: Velcade  To help prevent nausea and vomiting after your treatment, we encourage you to take your nausea medication as prescribed by your physician.   If you develop nausea and vomiting that is not controlled by your nausea medication, call the clinic.   BELOW ARE SYMPTOMS THAT SHOULD BE REPORTED IMMEDIATELY:  *FEVER GREATER THAN 100.5 F  *CHILLS WITH OR WITHOUT FEVER  NAUSEA AND VOMITING THAT IS NOT CONTROLLED WITH YOUR NAUSEA MEDICATION  *UNUSUAL SHORTNESS OF BREATH  *UNUSUAL BRUISING OR BLEEDING  TENDERNESS IN MOUTH AND THROAT WITH OR WITHOUT PRESENCE OF ULCERS  *URINARY PROBLEMS  *BOWEL PROBLEMS  UNUSUAL RASH Items with * indicate a potential emergency and should be followed up as soon as possible.  Feel free to call the clinic you have any questions or concerns. The clinic phone number is [phone].  Please show the [place] at check-in to the Emergency Department and triage nurse.

## 2014-06-09 NOTE — Telephone Encounter (Signed)
Confirm labs 05/09, will get calendar at appointment.

## 2014-06-12 ENCOUNTER — Other Ambulatory Visit: Payer: Self-pay | Admitting: Oncology

## 2014-06-12 ENCOUNTER — Ambulatory Visit (HOSPITAL_BASED_OUTPATIENT_CLINIC_OR_DEPARTMENT_OTHER): Payer: Self-pay

## 2014-06-12 ENCOUNTER — Other Ambulatory Visit: Payer: Self-pay | Admitting: *Deleted

## 2014-06-12 ENCOUNTER — Other Ambulatory Visit (HOSPITAL_BASED_OUTPATIENT_CLINIC_OR_DEPARTMENT_OTHER): Payer: Self-pay

## 2014-06-12 VITALS — BP 115/69 | HR 64 | Temp 97.9°F | Resp 18

## 2014-06-12 DIAGNOSIS — C9000 Multiple myeloma not having achieved remission: Secondary | ICD-10-CM

## 2014-06-12 DIAGNOSIS — Z5111 Encounter for antineoplastic chemotherapy: Secondary | ICD-10-CM

## 2014-06-12 DIAGNOSIS — N183 Chronic kidney disease, stage 3 unspecified: Secondary | ICD-10-CM

## 2014-06-12 DIAGNOSIS — Z5112 Encounter for antineoplastic immunotherapy: Secondary | ICD-10-CM

## 2014-06-12 DIAGNOSIS — D630 Anemia in neoplastic disease: Secondary | ICD-10-CM

## 2014-06-12 DIAGNOSIS — M899 Disorder of bone, unspecified: Secondary | ICD-10-CM

## 2014-06-12 DIAGNOSIS — M898X9 Other specified disorders of bone, unspecified site: Secondary | ICD-10-CM

## 2014-06-12 LAB — CBC WITH DIFFERENTIAL/PLATELET
BASO%: 0.2 % (ref 0.0–2.0)
Basophils Absolute: 0 10*3/uL (ref 0.0–0.1)
EOS%: 0 % (ref 0.0–7.0)
Eosinophils Absolute: 0 10*3/uL (ref 0.0–0.5)
HCT: 31.5 % — ABNORMAL LOW (ref 38.4–49.9)
HEMOGLOBIN: 10.5 g/dL — AB (ref 13.0–17.1)
LYMPH%: 20.7 % (ref 14.0–49.0)
MCH: 34 pg — AB (ref 27.2–33.4)
MCHC: 33.5 g/dL (ref 32.0–36.0)
MCV: 101.3 fL — ABNORMAL HIGH (ref 79.3–98.0)
MONO#: 1.4 10*3/uL — ABNORMAL HIGH (ref 0.1–0.9)
MONO%: 14.1 % — AB (ref 0.0–14.0)
NEUT#: 6.4 10*3/uL (ref 1.5–6.5)
NEUT%: 65 % (ref 39.0–75.0)
Platelets: 343 10*3/uL (ref 140–400)
RBC: 3.1 10*6/uL — ABNORMAL LOW (ref 4.20–5.82)
RDW: 14.8 % — AB (ref 11.0–14.6)
WBC: 9.8 10*3/uL (ref 4.0–10.3)
lymph#: 2 10*3/uL (ref 0.9–3.3)

## 2014-06-12 MED ORDER — SODIUM CHLORIDE 0.9 % IJ SOLN
3.0000 mL | INTRAMUSCULAR | Status: DC | PRN
  Filled 2014-06-12: qty 10

## 2014-06-12 MED ORDER — ALTEPLASE 2 MG IJ SOLR
2.0000 mg | Freq: Once | INTRAMUSCULAR | Status: DC | PRN
  Filled 2014-06-12: qty 2

## 2014-06-12 MED ORDER — SODIUM CHLORIDE 0.9 % IV SOLN
300.0000 mg/m2 | Freq: Once | INTRAVENOUS | Status: AC
  Administered 2014-06-12: 520 mg via INTRAVENOUS
  Filled 2014-06-12: qty 26

## 2014-06-12 MED ORDER — SODIUM CHLORIDE 0.9 % IV SOLN
Freq: Once | INTRAVENOUS | Status: AC
  Administered 2014-06-12: 16:00:00 via INTRAVENOUS

## 2014-06-12 MED ORDER — ONDANSETRON HCL 8 MG PO TABS
ORAL_TABLET | ORAL | Status: AC
  Filled 2014-06-12: qty 1

## 2014-06-12 MED ORDER — ONDANSETRON HCL 8 MG PO TABS
8.0000 mg | ORAL_TABLET | Freq: Once | ORAL | Status: AC
  Administered 2014-06-12: 8 mg via ORAL

## 2014-06-12 MED ORDER — SODIUM CHLORIDE 0.9 % IJ SOLN
10.0000 mL | INTRAMUSCULAR | Status: DC | PRN
  Filled 2014-06-12: qty 10

## 2014-06-12 MED ORDER — HEPARIN SOD (PORK) LOCK FLUSH 100 UNIT/ML IV SOLN
250.0000 [IU] | Freq: Once | INTRAVENOUS | Status: DC
  Filled 2014-06-12: qty 5

## 2014-06-12 MED ORDER — BORTEZOMIB CHEMO SQ INJECTION 3.5 MG (2.5MG/ML)
1.3000 mg/m2 | Freq: Once | INTRAMUSCULAR | Status: AC
  Administered 2014-06-12: 2.25 mg via SUBCUTANEOUS
  Filled 2014-06-12: qty 2.25

## 2014-06-12 NOTE — Progress Notes (Signed)
Ok to treat without CMET per MD Magrinat

## 2014-06-12 NOTE — Patient Instructions (Signed)
[facility] Discharge Instructions for Patients Receiving Chemotherapy  Today you received the following chemotherapy agents Velcade/Cytoxan.  To help prevent nausea and vomiting after your treatment, we encourage you to take your nausea medication as directed.    If you develop nausea and vomiting that is not controlled by your nausea medication, call the clinic.   BELOW ARE SYMPTOMS THAT SHOULD BE REPORTED IMMEDIATELY:  *FEVER GREATER THAN 100.5 F  *CHILLS WITH OR WITHOUT FEVER  NAUSEA AND VOMITING THAT IS NOT CONTROLLED WITH YOUR NAUSEA MEDICATION  *UNUSUAL SHORTNESS OF BREATH  *UNUSUAL BRUISING OR BLEEDING  TENDERNESS IN MOUTH AND THROAT WITH OR WITHOUT PRESENCE OF ULCERS  *URINARY PROBLEMS  *BOWEL PROBLEMS  UNUSUAL RASH Items with * indicate a potential emergency and should be followed up as soon as possible.  Feel free to call the clinic you have any questions or concerns. The clinic phone number is [phone].  Please show the [place] at check-in to the Emergency Department and triage nurse.

## 2014-06-23 ENCOUNTER — Other Ambulatory Visit (HOSPITAL_BASED_OUTPATIENT_CLINIC_OR_DEPARTMENT_OTHER): Payer: Self-pay

## 2014-06-23 DIAGNOSIS — N183 Chronic kidney disease, stage 3 unspecified: Secondary | ICD-10-CM

## 2014-06-23 DIAGNOSIS — M899 Disorder of bone, unspecified: Secondary | ICD-10-CM

## 2014-06-23 DIAGNOSIS — D630 Anemia in neoplastic disease: Secondary | ICD-10-CM

## 2014-06-23 DIAGNOSIS — C9000 Multiple myeloma not having achieved remission: Secondary | ICD-10-CM

## 2014-06-23 LAB — COMPREHENSIVE METABOLIC PANEL (CC13)
ALBUMIN: 3.5 g/dL (ref 3.5–5.0)
ALK PHOS: 118 U/L (ref 40–150)
ALT: 20 U/L (ref 0–55)
AST: 21 U/L (ref 5–34)
Anion Gap: 13 mEq/L — ABNORMAL HIGH (ref 3–11)
BUN: 11.3 mg/dL (ref 7.0–26.0)
CO2: 23 mEq/L (ref 22–29)
Calcium: 9.1 mg/dL (ref 8.4–10.4)
Chloride: 109 mEq/L (ref 98–109)
Creatinine: 0.7 mg/dL (ref 0.7–1.3)
Glucose: 93 mg/dl (ref 70–140)
Potassium: 3.9 mEq/L (ref 3.5–5.1)
SODIUM: 144 meq/L (ref 136–145)
TOTAL PROTEIN: 6.4 g/dL (ref 6.4–8.3)
Total Bilirubin: <0.2 mg/dL (ref 0.20–1.20)

## 2014-06-23 LAB — CBC WITH DIFFERENTIAL/PLATELET
BASO%: 0.4 % (ref 0.0–2.0)
Basophils Absolute: 0 10*3/uL (ref 0.0–0.1)
EOS ABS: 0 10*3/uL (ref 0.0–0.5)
EOS%: 0.5 % (ref 0.0–7.0)
HCT: 37 % — ABNORMAL LOW (ref 38.4–49.9)
HGB: 12.3 g/dL — ABNORMAL LOW (ref 13.0–17.1)
LYMPH%: 14.4 % (ref 14.0–49.0)
MCH: 33.9 pg — AB (ref 27.2–33.4)
MCHC: 33.1 g/dL (ref 32.0–36.0)
MCV: 102.4 fL — AB (ref 79.3–98.0)
MONO#: 0.7 10*3/uL (ref 0.1–0.9)
MONO%: 8.1 % (ref 0.0–14.0)
NEUT#: 6.9 10*3/uL — ABNORMAL HIGH (ref 1.5–6.5)
NEUT%: 76.6 % — ABNORMAL HIGH (ref 39.0–75.0)
PLATELETS: 379 10*3/uL (ref 140–400)
RBC: 3.61 10*6/uL — AB (ref 4.20–5.82)
RDW: 14.9 % — ABNORMAL HIGH (ref 11.0–14.6)
WBC: 9 10*3/uL (ref 4.0–10.3)
lymph#: 1.3 10*3/uL (ref 0.9–3.3)

## 2014-06-23 LAB — LACTATE DEHYDROGENASE (CC13): LDH: 176 U/L (ref 125–245)

## 2014-06-24 LAB — PROTEIN / CREATININE RATIO, URINE
Creatinine, Urine: 135.2 mg/dL
PROTEIN CREATININE RATIO: 0.56 — AB (ref <0.15)
Total Protein, Urine: 76 mg/dL — ABNORMAL HIGH (ref 5–25)

## 2014-06-25 LAB — KAPPA/LAMBDA LIGHT CHAINS
KAPPA LAMBDA RATIO: 1.25 (ref 0.26–1.65)
Kappa free light chain: 2.09 mg/dL — ABNORMAL HIGH (ref 0.33–1.94)
Lambda Free Lght Chn: 1.67 mg/dL (ref 0.57–2.63)

## 2014-06-25 LAB — PROTEIN ELECTROPHORESIS, SERUM
Abnormal Protein Band1: 0.2 g/dL
Albumin ELP: 3.9 g/dL (ref 3.8–4.8)
Alpha-1-Globulin: 0.3 g/dL (ref 0.2–0.3)
Alpha-2-Globulin: 0.9 g/dL (ref 0.5–0.9)
Beta 2: 0.3 g/dL (ref 0.2–0.5)
Beta Globulin: 0.4 g/dL (ref 0.4–0.6)
Gamma Globulin: 0.6 g/dL — ABNORMAL LOW (ref 0.8–1.7)
TOTAL PROTEIN, SERUM ELECTROPHOR: 6.4 g/dL (ref 6.1–8.1)

## 2014-06-25 LAB — IGG, IGA, IGM
IGA: 67 mg/dL — AB (ref 68–379)
IGG (IMMUNOGLOBIN G), SERUM: 591 mg/dL — AB (ref 650–1600)
IgM, Serum: 51 mg/dL (ref 41–251)

## 2014-06-25 LAB — BETA 2 MICROGLOBULIN, SERUM: Beta-2 Microglobulin: 2.7 mg/L — ABNORMAL HIGH (ref <=2.51)

## 2014-06-26 ENCOUNTER — Ambulatory Visit (HOSPITAL_BASED_OUTPATIENT_CLINIC_OR_DEPARTMENT_OTHER): Payer: Self-pay | Admitting: Oncology

## 2014-06-26 ENCOUNTER — Other Ambulatory Visit: Payer: Self-pay

## 2014-06-26 ENCOUNTER — Telehealth: Payer: Self-pay | Admitting: Oncology

## 2014-06-26 VITALS — BP 136/70 | HR 79 | Temp 98.3°F | Resp 18 | Ht 62.0 in | Wt 158.5 lb

## 2014-06-26 DIAGNOSIS — N183 Chronic kidney disease, stage 3 (moderate): Secondary | ICD-10-CM

## 2014-06-26 DIAGNOSIS — C9000 Multiple myeloma not having achieved remission: Secondary | ICD-10-CM

## 2014-06-26 MED ORDER — LENALIDOMIDE 25 MG PO CAPS: 10.0000 mg | ORAL_CAPSULE | Freq: Every day | ORAL | Status: DC

## 2014-06-26 NOTE — Progress Notes (Signed)
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 The pain was worse with coughing. Exam was unremarkable, and he was treated with Mobic. No labs were obtained. On 01/30/2014 he presented to the emergency room at [facility] with similar complaints and also reported a 10 pound weight loss over the past month. A chest x-ray was significant only for mild atelectasis, but ACT NGO of the chest 01/30/2014, while it showed no pulmonary emboli, showed an osteolytic lesion destroying the manubrium of the sternum. There was extra osseous extension into the anterior mediastinum.  The patient was admitted and found to have a creatinine of 1.64 with a GFR of 51. Bilateral renal ultrasound showed no hydronephrosis or masses, and normal size and cortical thickness of both kidneys, though there was mild diffuse echogenicity. Urine total protein was 145 mg/dL and immunofixation showed a monoclonal IgG kappa protein as well as monoclonal free kappa light chains. On 01/31/2014 serum protein electrophoresis showed an M spike of 2.22 g/dL with a total protein of 8.3 and albumin 3.4. Kappa lambda light chains from the serum obtained 02/04/2014 showed 10.50 mg/dL on the, 1.74 in the lambda, with an elevated ratio at 6.03. Beta-2 microglobulin was 7.36. LDH was normal at 184 and HIV antibody was nonreactive.  Biopsy of the manubrial mass 02/03/2014 showed extensive  infiltration of the bone by sheets of atypical plasma cells, positive for CD138, kappa restricted (SZA 16-23). Right iliac bone marrow biopsy Charles [name] 05/21/2014 (FZB 16-1) showed an average of 12% plasma cells with numerous aggregates and sheets of atypical plasma cells, which were kappa restricted. Cytogenetic analysis is pending  The patient's subsequent history is as detailed below  INTERVAL HISTORY: Charles [name] returns today for follow-up of his multiple myeloma, accompanied by his wife. Since his last visit here he completed 6 cycles of bortezomib/cyclophosphamide/dexamethasone. He tolerated them well. He has had an excellent response area we are now ready to start maintenance therapy    REVIEW OF SYSTEMS: Charles [name] has throbbing pain in both shoulders at times, but this is very intermittent. Sometimes depending on what he eats he can have some loose bowel movements. Recently he had some difficulty swallowing and some pain on swallowing, but that has resolved. A detailed review of systems today was otherwise entirely negative.   PAST MEDICAL HISTORY: Past Medical History  Diagnosis Date  . IgG myeloma   . CKD (chronic kidney disease), stage III   . Hypercholesterolemia   . Lytic bone lesions on xray     PAST SURGICAL HISTORY: No past surgical history on file.  FAMILY HISTORY No family history on file. The patient's parents are still living, in their late 82's. The patient has 2 brothers, 3 sisters. There is noc cancer in the fmaily to his knowledge.  SOCIAL HISTORY:  Originally from [facility]) [place], moved here 19 years ago. He works in Nurse, adult. Married (wife's name is Charles [name]), lives in [place]. He has 5 children ages  20, 17, 12, 10 and 20 months Charles [name], Pe Ell, Charles [name], Charles [name] and Charles [name]) in good health. Best number to call him is [phone]    ADVANCED DIRECTIVES: not  In place   HEALTH MAINTENANCE: History  Substance Use Topics  . Smoking status:  Never Smoker   . Smokeless tobacco: Never Used  . Alcohol Use: No     Colonoscopy:  PSA:  Bone density:  Lipid panel:  No Known Allergies  Current Outpatient Prescriptions  Medication Sig Dispense Refill  . acetaminophen (TYLENOL) 325 MG tablet Take 2 tablets (650 mg total) by mouth every 6 (six) hours as needed for mild pain (or Fever >/= 101). (Patient not taking: Reported on 04/07/2014)    . acyclovir (ZOVIRAX) 400 MG tablet Take 1 tablet (400 mg total) by mouth 2 (two) times daily. 60 tablet 6  . dexamethasone (DECADRON) 4 MG tablet Tome 5 pastillas al desayuno cada martes y miercoles (take 5 tablets w breakfast every Tuesday and wes) 50 tablet 12  . lenalidomide (REVLIMID) 25 MG capsule Take 1 capsule (25 mg total) by mouth daily. 21 capsule 3  . ondansetron (ZOFRAN) 8 MG tablet Take 1 tablet (8 mg total) by mouth 2 (two) times daily. 20 tablet 0   No current facility-administered medications for this visit.    OBJECTIVE: Charles [name] man  in no acute distress  Filed Vitals:   06/26/14 0938  BP: 136/70  Pulse: 79  Temp: 98.3 F (36.8 C)  Resp: 18     Body mass index is 28.98 kg/(m^2).    Charles [name] FS:0 - Asymptomatic  Sclerae unicteric, EOMs intact Oropharynx clear and moist No cervical or supraclavicular adenopathy Lungs no rales or rhonchi Heart regular rate and rhythm Abd soft, nontender, positive bowel sounds MSK no focal spinal tenderness, no visible or palpable shoulder abnormality, with good range of motion Neuro: nonfocal, well oriented, appropriate affect   LAB RESULTS: Results for Charles, [name] (MRN [id number]) as of 06/26/2014 10:28  Ref. Range 03/27/2014 13:48 04/14/2014 13:00 05/12/2014 13:37 06/02/2014 14:34 06/23/2014 14:47  M-SPIKE, % Latest Units: g/dL 0.45 0.36     Results for Charles, [name] (MRN [id number]) as of 06/26/2014 10:28  Ref. Range 03/27/2014 13:48 04/14/2014 13:00 05/12/2014 13:37 06/02/2014 14:34 06/23/2014 14:47  Kappa:Lambda Ratio Latest  Ref Range: 0.26-1.65  2.37 (H) 2.18 (H) 1.74 (H) 1.24 1.25  Results for Charles, [name] (MRN [id number]) as of 06/26/2014 10:28  Ref. Range 03/27/2014 13:48 04/14/2014 13:00 05/12/2014 13:37 06/02/2014 14:34 06/23/2014 14:47  IgG (Immunoglobin G), Serum Latest Ref Range: [phone] mg/dL 887  650  591 (L)    CMP     Component Value Date/Time   NA 144 06/23/2014 1447   NA 135 02/04/2014 0534   K 3.9 06/23/2014 1447   K 4.0 02/04/2014 0534   CL 103 02/04/2014 0534   CO2 23 06/23/2014 1447   CO2 26 02/04/2014 0534   GLUCOSE 93 06/23/2014 1447   GLUCOSE 115* 02/04/2014 0534   BUN 11.3 06/23/2014 1447   BUN 20 02/04/2014 0534   CREATININE 0.7 06/23/2014 1447   CREATININE 1.75* 02/04/2014 0534   CALCIUM 9.1 06/23/2014 1447   CALCIUM 10.6* 02/04/2014 0534   PROT 6.4 06/23/2014 1447   PROT 7.5 02/04/2014 0534   ALBUMIN 3.5 06/23/2014 1447   ALBUMIN 1.9* 02/04/2014 0534   AST 21 06/23/2014 1447   AST 24 02/04/2014 0534   ALT 20 06/23/2014 1447   ALT 18 02/04/2014 0534   ALKPHOS 118 06/23/2014 1447  ALKPHOS 102 02/04/2014 0534   BILITOT <0.20 06/23/2014 1447   BILITOT 0.4 02/04/2014 0534   GFRNONAA 47* 02/04/2014 0534   GFRAA 55* 02/04/2014 0534    INo results found for: SPEP, UPEP  Lab Results  Component Value Date   WBC 9.0 06/23/2014   NEUTROABS 6.9* 06/23/2014   HGB 12.3* 06/23/2014   HCT 37.0* 06/23/2014   MCV 102.4* 06/23/2014   PLT 379 06/23/2014      Chemistry      Component Value Date/Time   NA 144 06/23/2014 1447   NA 135 02/04/2014 0534   K 3.9 06/23/2014 1447   K 4.0 02/04/2014 0534   CL 103 02/04/2014 0534   CO2 23 06/23/2014 1447   CO2 26 02/04/2014 0534   BUN 11.3 06/23/2014 1447   BUN 20 02/04/2014 0534   CREATININE 0.7 06/23/2014 1447   CREATININE 1.75* 02/04/2014 0534      Component Value Date/Time   CALCIUM 9.1 06/23/2014 1447   CALCIUM 10.6* 02/04/2014 0534   ALKPHOS 118 06/23/2014 1447   ALKPHOS 102 02/04/2014 0534   AST 21 06/23/2014  1447   AST 24 02/04/2014 0534   ALT 20 06/23/2014 1447   ALT 18 02/04/2014 0534   BILITOT <0.20 06/23/2014 1447   BILITOT 0.4 02/04/2014 0534       No results found for: LABCA2  No components found for: LABCA125  No results for input(s): INR in the last 168 hours.  Urinalysis    Component Value Date/Time   COLORURINE YELLOW 01/31/2014 1050   APPEARANCEUR CLOUDY* 01/31/2014 1050   LABSPEC 1.019 01/31/2014 1050   PHURINE 6.0 01/31/2014 1050   GLUCOSEU NEGATIVE 01/31/2014 1050   HGBUR LARGE* 01/31/2014 1050   BILIRUBINUR NEGATIVE 01/31/2014 1050   KETONESUR NEGATIVE 01/31/2014 1050   PROTEINUR 100* 01/31/2014 1050   UROBILINOGEN 0.2 01/31/2014 1050   NITRITE NEGATIVE 01/31/2014 1050   LEUKOCYTESUR NEGATIVE 01/31/2014 1050    STUDIES: No results found.  ASSESSMENT: 39 y.o. 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 The patient is not a transplant candidate for financial  reasons   PLAN: Maximus has had a very good response to induction therapy. His M spike is now just about unmeasurable. His total IgG is less than 600. The kappa lambda light chain ratio is normalized.  At this point we are switching to lenalidomide. We are going to use 10 mg daily, which I think he will tolerate. We did discuss the possible toxicities, [name] effects and complications of this medication and I have  asked him to take it at bedtime 21 days on and 7 days off.  We also discussed the possibility of fetal deformities. He had his wife already have 5 children and they are very concerned that she not get pregnant in any case quite aside from the lenalidomide issue. They do understand the risk of fetal abnormalities. She is going to obtain birth control pills from a local dispensary. He is going to continue to use condoms 100% of the time. He would be interested in having a vasectomy if we could arrange that for him although I am not sure how we could do that at this point.  We will plan to start the lenalidomide on June 10. He will have lab work that day and then every 14 days for the next several months. He will see [place] with the beginning of the second cycle, and then at the start of the second and third cycles. If everything is very stable we will start seeing him on a every second cycle, but continuing to check his counts at least on a monthly basis. We will be following the SPEP and kappa lambda light chains every month initially and then every 2 months.  He has a good understanding of the overall plan. He agrees with it. He knows the goal of treatment in his case is control. He will call with any problems that may develop before the next visit here. [name], MD   06/26/2014 10:16 AM

## 2014-06-26 NOTE — Telephone Encounter (Signed)
Appointments made and avs printed for patient °

## 2014-07-01 ENCOUNTER — Other Ambulatory Visit: Payer: Self-pay | Admitting: *Deleted

## 2014-07-01 MED ORDER — LENALIDOMIDE 10 MG PO CAPS: 10.0000 mg | ORAL_CAPSULE | Freq: Every day | ORAL | Status: DC

## 2014-07-01 NOTE — Telephone Encounter (Signed)
Received confirmation that pt has been registered with [employer] for Revlimid.  Verified dose as '10mg'$  daily.  Obtained script for signiture- need to verify insurance and how to obtain medication.

## 2014-07-11 ENCOUNTER — Encounter: Payer: Self-pay | Admitting: Oncology

## 2014-07-11 ENCOUNTER — Other Ambulatory Visit (HOSPITAL_BASED_OUTPATIENT_CLINIC_OR_DEPARTMENT_OTHER): Payer: Self-pay

## 2014-07-11 ENCOUNTER — Telehealth: Payer: Self-pay | Admitting: *Deleted

## 2014-07-11 DIAGNOSIS — N183 Chronic kidney disease, stage 3 unspecified: Secondary | ICD-10-CM

## 2014-07-11 DIAGNOSIS — C9000 Multiple myeloma not having achieved remission: Secondary | ICD-10-CM

## 2014-07-11 DIAGNOSIS — D630 Anemia in neoplastic disease: Secondary | ICD-10-CM

## 2014-07-11 DIAGNOSIS — M899 Disorder of bone, unspecified: Secondary | ICD-10-CM

## 2014-07-11 LAB — CBC WITH DIFFERENTIAL/PLATELET
BASO%: 0.3 % (ref 0.0–2.0)
Basophils Absolute: 0 10*3/uL (ref 0.0–0.1)
EOS%: 1.3 % (ref 0.0–7.0)
Eosinophils Absolute: 0.1 10*3/uL (ref 0.0–0.5)
HCT: 37 % — ABNORMAL LOW (ref 38.4–49.9)
HEMOGLOBIN: 12.3 g/dL — AB (ref 13.0–17.1)
LYMPH#: 2.1 10*3/uL (ref 0.9–3.3)
LYMPH%: 23.3 % (ref 14.0–49.0)
MCH: 34 pg — ABNORMAL HIGH (ref 27.2–33.4)
MCHC: 33.2 g/dL (ref 32.0–36.0)
MCV: 102.2 fL — ABNORMAL HIGH (ref 79.3–98.0)
MONO#: 0.8 10*3/uL (ref 0.1–0.9)
MONO%: 8.8 % (ref 0.0–14.0)
NEUT#: 5.9 10*3/uL (ref 1.5–6.5)
NEUT%: 66.3 % (ref 39.0–75.0)
Platelets: 319 10*3/uL (ref 140–400)
RBC: 3.62 10*6/uL — AB (ref 4.20–5.82)
RDW: 13.1 % (ref 11.0–14.6)
WBC: 9 10*3/uL (ref 4.0–10.3)

## 2014-07-11 NOTE — Progress Notes (Signed)
I placed app-- [employer] on desk of nurse for dr Jana Hakim for poss asst for Revlimid. The patient has no insurance and is not a citizen.

## 2014-07-11 NOTE — Telephone Encounter (Signed)
Message received from managed care stating need for Revlimid prescription to send to [facility].  Call returned and obtained VM for Raquel- message left stating signed script was left in managed care on 07/01/2014- ( see prior entries ).

## 2014-07-11 NOTE — Progress Notes (Signed)
The patient is bringing back tax info to see if asst is available for Revlimid. I sent message to [facility] for scrip to see if asst is available for him. The patient has no insurance he is not a [place] citizen

## 2014-07-14 ENCOUNTER — Other Ambulatory Visit: Payer: Self-pay | Admitting: *Deleted

## 2014-07-14 MED ORDER — LENALIDOMIDE 10 MG PO CAPS: 10.0000 mg | ORAL_CAPSULE | Freq: Every day | ORAL | Status: DC

## 2014-07-14 NOTE — Telephone Encounter (Signed)
Prescription printed for managed care per request.

## 2014-07-17 ENCOUNTER — Encounter: Payer: Self-pay | Admitting: Oncology

## 2014-07-17 NOTE — Progress Notes (Signed)
Per [employer] patient support,patient approved for asst for 1 month. I placed form on desk of nurse for dr. Jana Hakim for revlimid

## 2014-07-21 ENCOUNTER — Telehealth: Payer: Self-pay | Admitting: *Deleted

## 2014-07-21 NOTE — Telephone Encounter (Signed)
WHEN THE [employer] AUTHORIZATION NUMBER IS OBTAINED CALL [phone] EXTENSION [phone]

## 2014-07-22 NOTE — Telephone Encounter (Signed)
[employer] authorization number called in to phone no below.  Auth [id number], effective as of July 22, 2014.

## 2014-07-22 NOTE — Telephone Encounter (Signed)
RECEIVED A CALL FROM [facility], ADRIAN. NEED AN AUTHORIZATION NUMBER FROM [employer] CALLED TO [phone].

## 2014-07-25 ENCOUNTER — Other Ambulatory Visit (HOSPITAL_BASED_OUTPATIENT_CLINIC_OR_DEPARTMENT_OTHER): Payer: Self-pay

## 2014-07-25 DIAGNOSIS — M899 Disorder of bone, unspecified: Secondary | ICD-10-CM

## 2014-07-25 DIAGNOSIS — C9000 Multiple myeloma not having achieved remission: Secondary | ICD-10-CM

## 2014-07-25 DIAGNOSIS — D630 Anemia in neoplastic disease: Secondary | ICD-10-CM

## 2014-07-25 DIAGNOSIS — N183 Chronic kidney disease, stage 3 unspecified: Secondary | ICD-10-CM

## 2014-07-25 LAB — COMPREHENSIVE METABOLIC PANEL (CC13)
ALT: 24 U/L (ref 0–55)
ANION GAP: 8 meq/L (ref 3–11)
AST: 24 U/L (ref 5–34)
Albumin: 3.5 g/dL (ref 3.5–5.0)
Alkaline Phosphatase: 85 U/L (ref 40–150)
BUN: 15 mg/dL (ref 7.0–26.0)
CALCIUM: 8.8 mg/dL (ref 8.4–10.4)
CHLORIDE: 111 meq/L — AB (ref 98–109)
CO2: 23 meq/L (ref 22–29)
CREATININE: 0.9 mg/dL (ref 0.7–1.3)
EGFR: >90 mL/min/{1.73_m2} (ref >90)
GLUCOSE: 97 mg/dL (ref 70–140)
Potassium: 3.7 mEq/L (ref 3.5–5.1)
Sodium: 142 mEq/L (ref 136–145)
Total Bilirubin: <0.2 mg/dL (ref 0.20–1.20)
Total Protein: 6.3 g/dL — ABNORMAL LOW (ref 6.4–8.3)

## 2014-07-25 LAB — CBC WITH DIFFERENTIAL/PLATELET
BASO%: 1 % (ref 0.0–2.0)
Basophils Absolute: 0.1 10*3/uL (ref 0.0–0.1)
EOS%: 1.3 % (ref 0.0–7.0)
Eosinophils Absolute: 0.1 10*3/uL (ref 0.0–0.5)
HEMATOCRIT: 38.1 % — AB (ref 38.4–49.9)
HGB: 12.9 g/dL — ABNORMAL LOW (ref 13.0–17.1)
LYMPH%: 24.1 % (ref 14.0–49.0)
MCH: 33.8 pg — AB (ref 27.2–33.4)
MCHC: 33.7 g/dL (ref 32.0–36.0)
MCV: 100.2 fL — ABNORMAL HIGH (ref 79.3–98.0)
MONO#: 0.9 10*3/uL (ref 0.1–0.9)
MONO%: 10.1 % (ref 0.0–14.0)
NEUT#: 5.5 10*3/uL (ref 1.5–6.5)
NEUT%: 63.5 % (ref 39.0–75.0)
PLATELETS: 318 10*3/uL (ref 140–400)
RBC: 3.81 10*6/uL — ABNORMAL LOW (ref 4.20–5.82)
RDW: 13.6 % (ref 11.0–14.6)
WBC: 8.6 10*3/uL (ref 4.0–10.3)
lymph#: 2.1 10*3/uL (ref 0.9–3.3)

## 2014-07-26 LAB — PROTEIN / CREATININE RATIO, URINE
CREATININE, URINE: 210.9 mg/dL
PROTEIN CREATININE RATIO: 0.46 — AB (ref <0.15)
Total Protein, Urine: 98 mg/dL — ABNORMAL HIGH (ref 5–25)

## 2014-07-29 LAB — PROTEIN ELECTROPHORESIS, SERUM
ALPHA-1-GLOBULIN: 0.2 g/dL (ref 0.2–0.3)
ALPHA-2-GLOBULIN: 0.7 g/dL (ref 0.5–0.9)
Abnormal Protein Band1: 0.2 g/dL
Albumin ELP: 3.9 g/dL (ref 3.8–4.8)
Beta 2: 0.2 g/dL (ref 0.2–0.5)
Beta Globulin: 0.4 g/dL (ref 0.4–0.6)
Gamma Globulin: 0.7 g/dL — ABNORMAL LOW (ref 0.8–1.7)
Total Protein, Serum Electrophoresis: 6.2 g/dL (ref 6.1–8.1)

## 2014-07-29 LAB — KAPPA/LAMBDA LIGHT CHAINS
KAPPA FREE LGHT CHN: 3.17 mg/dL — AB (ref 0.33–1.94)
KAPPA LAMBDA RATIO: 1.63 (ref 0.26–1.65)
LAMBDA FREE LGHT CHN: 1.94 mg/dL (ref 0.57–2.63)

## 2014-08-08 ENCOUNTER — Other Ambulatory Visit (HOSPITAL_BASED_OUTPATIENT_CLINIC_OR_DEPARTMENT_OTHER): Payer: Self-pay

## 2014-08-08 ENCOUNTER — Ambulatory Visit (HOSPITAL_BASED_OUTPATIENT_CLINIC_OR_DEPARTMENT_OTHER): Payer: Self-pay | Admitting: Nurse Practitioner

## 2014-08-08 ENCOUNTER — Encounter: Payer: Self-pay | Admitting: Nurse Practitioner

## 2014-08-08 VITALS — BP 139/60 | HR 90 | Temp 98.7°F | Resp 18 | Ht 62.0 in | Wt 162.4 lb

## 2014-08-08 DIAGNOSIS — M899 Disorder of bone, unspecified: Secondary | ICD-10-CM

## 2014-08-08 DIAGNOSIS — D630 Anemia in neoplastic disease: Secondary | ICD-10-CM

## 2014-08-08 DIAGNOSIS — N183 Chronic kidney disease, stage 3 unspecified: Secondary | ICD-10-CM

## 2014-08-08 DIAGNOSIS — C9000 Multiple myeloma not having achieved remission: Secondary | ICD-10-CM

## 2014-08-08 DIAGNOSIS — M898X9 Other specified disorders of bone, unspecified site: Secondary | ICD-10-CM

## 2014-08-08 LAB — CBC WITH DIFFERENTIAL/PLATELET
BASO%: 0.2 % (ref 0.0–2.0)
Basophils Absolute: 0 10*3/uL (ref 0.0–0.1)
EOS ABS: 0.2 10*3/uL (ref 0.0–0.5)
EOS%: 1.5 % (ref 0.0–7.0)
HCT: 40.2 % (ref 38.4–49.9)
HGB: 13.7 g/dL (ref 13.0–17.1)
LYMPH#: 1.8 10*3/uL (ref 0.9–3.3)
LYMPH%: 14.7 % (ref 14.0–49.0)
MCH: 33.1 pg (ref 27.2–33.4)
MCHC: 34.1 g/dL (ref 32.0–36.0)
MCV: 97.1 fL (ref 79.3–98.0)
MONO#: 1.5 10*3/uL — AB (ref 0.1–0.9)
MONO%: 12.4 % (ref 0.0–14.0)
NEUT%: 71.2 % (ref 39.0–75.0)
NEUTROS ABS: 8.7 10*3/uL — AB (ref 1.5–6.5)
PLATELETS: 348 10*3/uL (ref 140–400)
RBC: 4.14 10*6/uL — ABNORMAL LOW (ref 4.20–5.82)
RDW: 12.7 % (ref 11.0–14.6)
WBC: 12.2 10*3/uL — AB (ref 4.0–10.3)

## 2014-08-08 NOTE — Progress Notes (Signed)
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 The pain was worse with coughing. Exam was unremarkable, and he was treated with Mobic. No labs were obtained. On 01/30/2014 he presented to the emergency room at [facility] with similar complaints and also reported a 10 pound weight loss over the past month. A chest x-ray was significant only for mild atelectasis, but ACT NGO of the chest 01/30/2014, while it showed no pulmonary emboli, showed an osteolytic lesion destroying the manubrium of the sternum. There was extra osseous extension into the anterior mediastinum.  The patient was admitted and found to have a creatinine of 1.64 with a GFR of 51. Bilateral renal ultrasound showed no hydronephrosis or masses, and normal size and cortical thickness of both kidneys, though there was mild diffuse echogenicity. Urine total protein was 145 mg/dL and immunofixation showed a monoclonal IgG kappa protein as well as monoclonal free kappa light chains. On 01/31/2014 serum protein electrophoresis showed an M spike of 2.22 g/dL with a total protein of 8.3 and albumin 3.4. Kappa lambda light chains from the serum obtained 02/04/2014 showed 10.50 mg/dL on the, 1.74 in the lambda, with an elevated ratio at 6.03. Beta-2 microglobulin was 7.36. LDH was normal at 184 and HIV antibody was nonreactive.  Biopsy of the manubrial mass 02/03/2014 showed extensive  infiltration of the bone by sheets of atypical plasma cells, positive for CD138, kappa restricted (SZA 16-23). Right iliac bone marrow biopsy [name] 05/21/2014 (FZB 16-1) showed an average of 12% plasma cells with numerous aggregates and sheets of atypical plasma cells, which were kappa restricted. Cytogenetic analysis is pending  The patient's subsequent history is as detailed below  INTERVAL HISTORY: Charles [name] returns today for follow-up of his multiple myeloma. He does not know when exactly he started the lenalidomide, but he has 9 tablets left, which means he likely started on 6/25. He tolerates this well. 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 There is none present today.   REVIEW OF SYSTEMS: Charles [name] denies fevers, chills, nausea, vomiting, or changes in bowel or bladder habits. He has no pain. He denies headaches, dizziness, night sweats, or weight loss. He has no shortness of breath, chest pain, cough, or palpitations. A detailed review of systems is otherwise stable.  PAST MEDICAL HISTORY: Past Medical History  Diagnosis Date  . IgG myeloma   . CKD (chronic kidney disease), stage III   . Hypercholesterolemia   . Lytic bone lesions on xray     PAST SURGICAL HISTORY: No past surgical history on file.  FAMILY HISTORY No family history on file. The patient's parents are still living, in their late 62's. The patient has 2 brothers, 3 sisters. There is noc cancer in the fmaily to his knowledge.  SOCIAL HISTORY:  Originally from [facility]) [place], moved here 19 years ago. He works in Nurse, adult. Married (wife's name is [name]), lives in [place]. He has 5 children  ages 58, 35, 89, 60 and 73 months [name], [name], [name] and [place]) in good health. Best number to call him is [phone]    ADVANCED DIRECTIVES: not  In place   HEALTH MAINTENANCE: History  Substance Use Topics  . Smoking status: Never Smoker   . Smokeless  tobacco: Never Used  . Alcohol Use: No     Colonoscopy:  PSA:  Bone density:  Lipid panel:  No Known Allergies  Current Outpatient Prescriptions  Medication Sig Dispense Refill  . acyclovir (ZOVIRAX) 400 MG tablet Take 1 tablet (400 mg total) by mouth 2 (two) times daily. 60 tablet 6  . lenalidomide (REVLIMID) 10 MG capsule Take 1 capsule (10 mg total) by mouth daily. 21 capsule 0  . acetaminophen (TYLENOL) 325 MG tablet Take 2 tablets (650 mg total) by mouth every 6 (six) hours as needed for mild pain (or Fever >/= 101). (Patient not taking: Reported on 04/07/2014)    . ondansetron (ZOFRAN) 8 MG tablet Take 1 tablet (8 mg total) by mouth 2 (two) times daily. (Patient not taking: Reported on 08/08/2014) 20 tablet 0   No current facility-administered medications for this visit.    OBJECTIVE: Charles [name] man  in no acute distress  Filed Vitals:   08/08/14 1511  BP: 139/60  Pulse: 90  Temp: 98.7 F (37.1 C)  Resp: 18     Body mass index is 29.7 kg/(m^2).    ECOG FS:0 - Asymptomatic  .Skin: warm, dry, cold sores to upper lip HEENT: sclerae anicteric, conjunctivae pink, oropharynx clear. No thrush or mucositis.  Lymph Nodes: No cervical or supraclavicular lymphadenopathy  Lungs: clear to auscultation bilaterally, no rales, wheezes, or rhonci  Heart: regular rate and rhythm  Abdomen: round, soft, non tender, positive bowel sounds  Musculoskeletal: No focal spinal tenderness, no peripheral edema  Neuro: non focal, well oriented, positive affect     LAB RESULTS: Results for Charles, [name] (MRN [id number]) as of 06/26/2014 10:28  Ref. Range 03/27/2014 13:48 04/14/2014 13:00 05/12/2014 13:37 06/02/2014 14:34 06/23/2014 14:47  M-SPIKE, % Latest Units: g/dL 0.45 0.36     Results for Charles, [name] (MRN [id number]) as of 06/26/2014 10:28  Ref. Range 03/27/2014 13:48 04/14/2014 13:00 05/12/2014 13:37 06/02/2014 14:34 06/23/2014 14:47  Kappa:Lambda Ratio Latest Ref Range: 0.26-1.65  2.37  (H) 2.18 (H) 1.74 (H) 1.24 1.25  Results for Charles, [name] (MRN [id number]) as of 06/26/2014 10:28  Ref. Range 03/27/2014 13:48 04/14/2014 13:00 05/12/2014 13:37 06/02/2014 14:34 06/23/2014 14:47  IgG (Immunoglobin G), Serum Latest Ref Range: [phone] mg/dL 887  650  591 (L)    CMP     Component Value Date/Time   NA 142 07/25/2014 1550   NA 135 02/04/2014 0534   K 3.7 07/25/2014 1550   K 4.0 02/04/2014 0534   CL 103 02/04/2014 0534   CO2 23 07/25/2014 1550   CO2 26 02/04/2014 0534   GLUCOSE 97 07/25/2014 1550   GLUCOSE 115* 02/04/2014 0534   BUN 15.0 07/25/2014 1550   BUN 20 02/04/2014 0534   CREATININE 0.9 07/25/2014 1550   CREATININE 1.75* 02/04/2014 0534   CALCIUM 8.8 07/25/2014 1550   CALCIUM 10.6* 02/04/2014 0534   PROT 6.3* 07/25/2014 1550   PROT 7.5 02/04/2014 0534   ALBUMIN 3.5 07/25/2014 1550   ALBUMIN 1.9* 02/04/2014 0534   AST 24 07/25/2014 1550   AST 24 02/04/2014 0534   ALT 24 07/25/2014 1550   ALT 18 02/04/2014 0534   ALKPHOS 85 07/25/2014 1550  ALKPHOS 102 02/04/2014 0534   BILITOT <0.20 07/25/2014 1550   BILITOT 0.4 02/04/2014 0534   GFRNONAA 47* 02/04/2014 0534   GFRAA 55* 02/04/2014 0534    INo results found for: SPEP, UPEP  Lab Results  Component Value Date   WBC 12.2* 08/08/2014   NEUTROABS 8.7* 08/08/2014   HGB 13.7 08/08/2014   HCT 40.2 08/08/2014   MCV 97.1 08/08/2014   PLT 348 08/08/2014      Chemistry      Component Value Date/Time   NA 142 07/25/2014 1550   NA 135 02/04/2014 0534   K 3.7 07/25/2014 1550   K 4.0 02/04/2014 0534   CL 103 02/04/2014 0534   CO2 23 07/25/2014 1550   CO2 26 02/04/2014 0534   BUN 15.0 07/25/2014 1550   BUN 20 02/04/2014 0534   CREATININE 0.9 07/25/2014 1550   CREATININE 1.75* 02/04/2014 0534      Component Value Date/Time   CALCIUM 8.8 07/25/2014 1550   CALCIUM 10.6* 02/04/2014 0534   ALKPHOS 85 07/25/2014 1550   ALKPHOS 102 02/04/2014 0534   AST 24 07/25/2014 1550   AST 24 02/04/2014 0534    ALT 24 07/25/2014 1550   ALT 18 02/04/2014 0534   BILITOT <0.20 07/25/2014 1550   BILITOT 0.4 02/04/2014 0534       No results found for: LABCA2  No components found for: LABCA125  No results for input(s): INR in the last 168 hours.  Urinalysis    Component Value Date/Time   COLORURINE YELLOW 01/31/2014 1050   APPEARANCEUR CLOUDY* 01/31/2014 1050   LABSPEC 1.019 01/31/2014 1050   PHURINE 6.0 01/31/2014 1050   GLUCOSEU NEGATIVE 01/31/2014 1050   HGBUR LARGE* 01/31/2014 1050   BILIRUBINUR NEGATIVE 01/31/2014 1050   KETONESUR NEGATIVE 01/31/2014 1050   PROTEINUR 100* 01/31/2014 1050   UROBILINOGEN 0.2 01/31/2014 1050   NITRITE NEGATIVE 01/31/2014 1050   LEUKOCYTESUR NEGATIVE 01/31/2014 1050    STUDIES: No results found.  ASSESSMENT: 39 y.o. 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;        I: CyBorD started January 2016  (a) dexamethasone 20 mg/d every TU/WED started 02/04/2014  (b) bortezomib sQ days 1,4,8,11 of every 21 day cycle started 02/10/2014  (c) cyclophosphamide 300 mg/M2 IV weekly started 02/13/2014      2: Lenalidomide 80m daily started 07/26/2014  (2) chronic kidney disease stage III  (3) hypercalcemia: zolendronate started 02/10/2014-- repeat every 4 weeks initially, then every 12 weeks  (4) ID prophylaxis:  (a) influenza and Pneumovax [PV13] vaccines 02/07/2014  (b) acyclovir started 02/07/2014  (c) HIV negative 01/31/2014  (d) Pneumovax P23 ro be given after March 2016  (5). The patient is not a transplant  candidate for financial reasons   PLAN: Charles [name] is doing well today. The CBC and CMET were reviewed in detail and were entirely stable. The IgG level has not yet returned. He is tolerating the lenolidomide well. The last dose of this cycle will be on 7/16. He will have 7 days off, then start cycle 2 on 7/24. I discussed his transient neuropathy symptoms with Dr. MJana Hakim  and as long as they are not persistent, we will continue this treatment.   Charles [name] will continue bimonthly labs. He will receive zometa on 7/22. His next follow up visit with be before the start of cycle 3. He understands and agrees with this plan. He knows the goal of treatment in his case is control. He has been encouraged to call with any issues that might arise before his next visit here.  Laurie Panda, NP   08/08/2014 4:17 PM

## 2014-08-09 LAB — IGG, IGA, IGM
IgA: 123 mg/dL (ref 68–379)
IgG (Immunoglobin G), Serum: 925 mg/dL (ref 650–1600)
IgM, Serum: 74 mg/dL (ref 41–251)

## 2014-08-12 ENCOUNTER — Other Ambulatory Visit: Payer: Self-pay | Admitting: Nurse Practitioner

## 2014-08-13 ENCOUNTER — Other Ambulatory Visit: Payer: Self-pay | Admitting: *Deleted

## 2014-08-13 DIAGNOSIS — C9000 Multiple myeloma not having achieved remission: Secondary | ICD-10-CM

## 2014-08-13 MED ORDER — LENALIDOMIDE 10 MG PO CAPS: 10.0000 mg | ORAL_CAPSULE | Freq: Every day | ORAL | Status: DC

## 2014-08-15 ENCOUNTER — Emergency Department (HOSPITAL_COMMUNITY)
Admission: EM | Admit: 2014-08-15 | Discharge: 2014-08-15 | Disposition: A | Discharge status: Home / Self Care | Payer: Self-pay | Attending: Emergency Medicine | Admitting: Emergency Medicine

## 2014-08-15 ENCOUNTER — Encounter (HOSPITAL_COMMUNITY): Payer: Self-pay | Admitting: Oncology

## 2014-08-15 DIAGNOSIS — Z79899 Other long term (current) drug therapy: Secondary | ICD-10-CM | POA: Insufficient documentation

## 2014-08-15 DIAGNOSIS — N183 Chronic kidney disease, stage 3 (moderate): Secondary | ICD-10-CM | POA: Insufficient documentation

## 2014-08-15 DIAGNOSIS — Z8739 Personal history of other diseases of the musculoskeletal system and connective tissue: Secondary | ICD-10-CM | POA: Insufficient documentation

## 2014-08-15 DIAGNOSIS — K0889 Other specified disorders of teeth and supporting structures: Secondary | ICD-10-CM

## 2014-08-15 DIAGNOSIS — Z8579 Personal history of other malignant neoplasms of lymphoid, hematopoietic and related tissues: Secondary | ICD-10-CM | POA: Insufficient documentation

## 2014-08-15 DIAGNOSIS — K088 Other specified disorders of teeth and supporting structures: Secondary | ICD-10-CM | POA: Insufficient documentation

## 2014-08-15 DIAGNOSIS — Z8639 Personal history of other endocrine, nutritional and metabolic disease: Secondary | ICD-10-CM | POA: Insufficient documentation

## 2014-08-15 MED ORDER — NAPROXEN 500 MG PO TABS
500.0000 mg | ORAL_TABLET | Freq: Once | ORAL | Status: AC
  Administered 2014-08-15: 500 mg via ORAL
  Filled 2014-08-15: qty 1

## 2014-08-15 MED ORDER — NAPROXEN 500 MG PO TABS: 500.0000 mg | ORAL_TABLET | Freq: Two times a day (BID) | ORAL | Status: DC

## 2014-08-15 MED ORDER — PENICILLIN V POTASSIUM 500 MG PO TABS
500.0000 mg | ORAL_TABLET | Freq: Once | ORAL | Status: AC
  Administered 2014-08-15: 500 mg via ORAL
  Filled 2014-08-15: qty 1

## 2014-08-15 MED ORDER — PENICILLIN V POTASSIUM 500 MG PO TABS: 500.0000 mg | ORAL_TABLET | Freq: Four times a day (QID) | ORAL | Status: DC

## 2014-08-15 NOTE — ED Provider Notes (Signed)
CSN: [id number]     Arrival date & time 08/15/14  2052 History  This chart was scribed for non-physician practitioner, Alvina Chou, PA-C working with Virgel Manifold, MD by Rayna Sexton, ED scribe. This patient was seen in room WTR8/WTR8 and the patient's care was started at 11:04 PM.   Chief Complaint  Patient presents with  . Dental Pain   The history is provided by the patient. No language interpreter was used.    HPI Comments: Charles [name] is a 39 y.o. male who presents to the Emergency Department complaining of constant, moderate, front left molar pain with onset 3 day ago. Pt notes a missing front right molar and further notes the current tooth is loose. Pt rates his pain as 8/10 and notes taking tylenol and experiencing no relief. He denies any current health coverage or having a dental provider. Pt has a history of myeloma and is currently taking an oral chemo agent. He denies fevers, chills, throat swelling, diaphoresis or HA.   Past Medical History  Diagnosis Date  . IgG myeloma   . CKD (chronic kidney disease), stage III   . Hypercholesterolemia   . Lytic bone lesions on xray    History reviewed. No pertinent past surgical history. No family history on file. History  Substance Use Topics  . Smoking status: Never Smoker   . Smokeless tobacco: Never Used  . Alcohol Use: No    Review of Systems  HENT: Positive for dental problem.   All other systems reviewed and are negative.  Allergies  Review of patient's allergies indicates no known allergies.  Home Medications   Prior to Admission medications   Medication Sig Start Date End Date Taking? Authorizing Provider  acetaminophen (TYLENOL) 325 MG tablet Take 2 tablets (650 mg total) by mouth every 6 (six) hours as needed for mild pain (or Fever >/= 101). Patient not taking: Reported on 04/07/2014 02/04/14   Jonetta Osgood, MD  acyclovir (ZOVIRAX) 400 MG tablet Take 1 tablet (400 mg total) by mouth 2 (two)  times daily. 05/01/14   [name], MD  lenalidomide (REVLIMID) 10 MG capsule Take 1 capsule (10 mg total) by mouth daily. 08/13/14   [name], MD  ondansetron (ZOFRAN) 8 MG tablet Take 1 tablet (8 mg total) by mouth 2 (two) times daily. Patient not taking: Reported on 08/08/2014 03/06/14   Genelle Gather Boelter, NP   BP 134/74 mmHg  Pulse 63  Temp(Src) 98.1 F (36.7 C) (Oral)  Resp 15  SpO2 100% Physical Exam  Constitutional: He is oriented to person, place, and time. He appears well-developed and well-nourished. No distress.  HENT:  Head: Normocephalic and atraumatic.  Mouth/Throat: Oropharynx is clear and moist.  Good dentition; right front tooth missing; left front tooth tender to percussion; no abscess noted  Eyes: Conjunctivae and EOM are normal. Pupils are equal, round, and reactive to light.  Neck: Normal range of motion. Neck supple. No tracheal deviation present.  Cardiovascular: Normal rate.   Pulmonary/Chest: Effort normal and breath sounds normal. No respiratory distress.  Abdominal: Soft. He exhibits no distension. There is no tenderness.  Musculoskeletal: Normal range of motion.  Neurological: He is alert and oriented to person, place, and time. Coordination normal.  Skin: Skin is warm and dry.  Psychiatric: He has a normal mood and affect. His behavior is normal.  Nursing note and vitals reviewed.  ED Course  Procedures  DIAGNOSTIC STUDIES: Oxygen Saturation is 100% on RA, normal by my  interpretation.    COORDINATION OF CARE: 11:06 PM Discussed treatment plan with pt at bedside and pt agreed to plan.  Labs Review Labs Reviewed - No data to display  Imaging Review No results found.   EKG Interpretation None      MDM   Final diagnoses:  Pain, dental    11:10 PM Patient has no signs of ludwigs angina. Patient will have veetid and naprosyn for symptoms. Vitals stable and patient afebrile. Patient given dental follow up.   I personally  performed the services described in this documentation, which was scribed in my presence. The recorded information has been reviewed and is accurate.    Alvina Chou, PA-C 08/15/14 2311  Virgel Manifold, MD 08/16/14 [phone]

## 2014-08-15 NOTE — Discharge Instructions (Signed)
Take Veetid as directed until gone. Take Naprosyn as needed for pain. Refer to attached documents for more information. Follow up with the recommended dentist for further evaluation.

## 2014-08-15 NOTE — ED Notes (Signed)
Per pt he presents d/t dental pain top front tooth.  Pt has hx of myeloma and is currently on oral chemo agent.  No lesions, bumps or white patches seen.  Pt rates his pain 8/10.

## 2014-08-22 ENCOUNTER — Other Ambulatory Visit (HOSPITAL_BASED_OUTPATIENT_CLINIC_OR_DEPARTMENT_OTHER): Payer: Self-pay

## 2014-08-22 ENCOUNTER — Ambulatory Visit (HOSPITAL_BASED_OUTPATIENT_CLINIC_OR_DEPARTMENT_OTHER): Payer: Self-pay

## 2014-08-22 ENCOUNTER — Ambulatory Visit: Payer: Self-pay | Admitting: Nurse Practitioner

## 2014-08-22 VITALS — BP 130/61 | HR 52 | Temp 97.8°F | Resp 16

## 2014-08-22 DIAGNOSIS — N183 Chronic kidney disease, stage 3 unspecified: Secondary | ICD-10-CM

## 2014-08-22 DIAGNOSIS — C9000 Multiple myeloma not having achieved remission: Secondary | ICD-10-CM

## 2014-08-22 DIAGNOSIS — M899 Disorder of bone, unspecified: Secondary | ICD-10-CM

## 2014-08-22 DIAGNOSIS — D630 Anemia in neoplastic disease: Secondary | ICD-10-CM

## 2014-08-22 LAB — CBC WITH DIFFERENTIAL/PLATELET
BASO%: 0.7 % (ref 0.0–2.0)
BASOS ABS: 0.1 10*3/uL (ref 0.0–0.1)
EOS%: 2 % (ref 0.0–7.0)
Eosinophils Absolute: 0.1 10*3/uL (ref 0.0–0.5)
HCT: 35.6 % — ABNORMAL LOW (ref 38.4–49.9)
HEMOGLOBIN: 12 g/dL — AB (ref 13.0–17.1)
LYMPH%: 30 % (ref 14.0–49.0)
MCH: 32.8 pg (ref 27.2–33.4)
MCHC: 33.7 g/dL (ref 32.0–36.0)
MCV: 97.3 fL (ref 79.3–98.0)
MONO#: 0.8 10*3/uL (ref 0.1–0.9)
MONO%: 11.1 % (ref 0.0–14.0)
NEUT#: 3.9 10*3/uL (ref 1.5–6.5)
NEUT%: 56.2 % (ref 39.0–75.0)
PLATELETS: 434 10*3/uL — AB (ref 140–400)
RBC: 3.66 10*6/uL — ABNORMAL LOW (ref 4.20–5.82)
RDW: 13.1 % (ref 11.0–14.6)
WBC: 7 10*3/uL (ref 4.0–10.3)
lymph#: 2.1 10*3/uL (ref 0.9–3.3)

## 2014-08-22 LAB — COMPREHENSIVE METABOLIC PANEL (CC13)
ALT: 16 U/L (ref 0–55)
AST: 18 U/L (ref 5–34)
Albumin: 3.3 g/dL — ABNORMAL LOW (ref 3.5–5.0)
Alkaline Phosphatase: 75 U/L (ref 40–150)
Anion Gap: 7 mEq/L (ref 3–11)
BUN: 13.1 mg/dL (ref 7.0–26.0)
CALCIUM: 8.8 mg/dL (ref 8.4–10.4)
CHLORIDE: 112 meq/L — AB (ref 98–109)
CO2: 23 mEq/L (ref 22–29)
CREATININE: 0.7 mg/dL (ref 0.7–1.3)
Glucose: 118 mg/dl (ref 70–140)
POTASSIUM: 3.8 meq/L (ref 3.5–5.1)
SODIUM: 142 meq/L (ref 136–145)
Total Bilirubin: 0.2 mg/dL (ref 0.20–1.20)
Total Protein: 6.3 g/dL — ABNORMAL LOW (ref 6.4–8.3)

## 2014-08-22 MED ORDER — SODIUM CHLORIDE 0.9 % IV SOLN
Freq: Once | INTRAVENOUS | Status: AC
  Administered 2014-08-22: 16:00:00 via INTRAVENOUS

## 2014-08-22 MED ORDER — ZOLEDRONIC ACID 4 MG/100ML IV SOLN
4.0000 mg | Freq: Once | INTRAVENOUS | Status: AC
  Administered 2014-08-22: 4 mg via INTRAVENOUS
  Filled 2014-08-22: qty 100

## 2014-08-22 NOTE — Patient Instructions (Signed)

## 2014-08-23 LAB — PROTEIN / CREATININE RATIO, URINE
CREATININE, URINE: 205.5 mg/dL
Protein Creatinine Ratio: 0.6 — ABNORMAL HIGH (ref <0.15)
TOTAL PROTEIN, URINE: 123 mg/dL — AB (ref 5–25)

## 2014-08-26 LAB — PROTEIN ELECTROPHORESIS, SERUM
ALPHA-1-GLOBULIN: 0.3 g/dL (ref 0.2–0.3)
ALPHA-2-GLOBULIN: 0.9 g/dL (ref 0.5–0.9)
Abnormal Protein Band1: 0.2 g/dL
Albumin ELP: 3.6 g/dL — ABNORMAL LOW (ref 3.8–4.8)
BETA 2: 0.3 g/dL (ref 0.2–0.5)
BETA GLOBULIN: 0.4 g/dL (ref 0.4–0.6)
Gamma Globulin: 0.9 g/dL (ref 0.8–1.7)
Total Protein, Serum Electrophoresis: 6.4 g/dL (ref 6.1–8.1)

## 2014-08-26 LAB — KAPPA/LAMBDA LIGHT CHAINS
KAPPA LAMBDA RATIO: 1.63 (ref 0.26–1.65)
Kappa free light chain: 4.94 mg/dL — ABNORMAL HIGH (ref 0.33–1.94)
Lambda Free Lght Chn: 3.03 mg/dL — ABNORMAL HIGH (ref 0.57–2.63)

## 2014-09-03 ENCOUNTER — Ambulatory Visit (HOSPITAL_BASED_OUTPATIENT_CLINIC_OR_DEPARTMENT_OTHER): Payer: Self-pay | Admitting: Oncology

## 2014-09-03 ENCOUNTER — Other Ambulatory Visit: Payer: Self-pay | Admitting: Oncology

## 2014-09-03 DIAGNOSIS — N183 Chronic kidney disease, stage 3 (moderate): Secondary | ICD-10-CM

## 2014-09-03 DIAGNOSIS — R21 Rash and other nonspecific skin eruption: Secondary | ICD-10-CM

## 2014-09-03 DIAGNOSIS — C9000 Multiple myeloma not having achieved remission: Secondary | ICD-10-CM

## 2014-09-03 MED ORDER — FLUCONAZOLE 100 MG PO TABS: 100.0000 mg | ORAL_TABLET | Freq: Every day | ORAL | Status: DC

## 2014-09-03 MED ORDER — SULFAMETHOXAZOLE-TRIMETHOPRIM 800-160 MG PO TABS: 1.0000 | ORAL_TABLET | Freq: Two times a day (BID) | ORAL | Status: DC

## 2014-09-03 NOTE — Progress Notes (Signed)
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 The pain was worse with coughing. Exam was unremarkable, and he was treated with Mobic. No labs were obtained. On 01/30/2014 he presented to the emergency room at [facility] with similar complaints and also reported a 10 pound weight loss over the past month. A chest x-ray was significant only for mild atelectasis, but ACT NGO of the chest 01/30/2014, while it showed no pulmonary emboli, showed an osteolytic lesion destroying the manubrium of the sternum. There was extra osseous extension into the anterior mediastinum.  The patient was admitted and found to have a creatinine of 1.64 with a GFR of 51. Bilateral renal ultrasound showed no hydronephrosis or masses, and normal size and cortical thickness of both kidneys, though there was mild diffuse echogenicity. Urine total protein was 145 mg/dL and immunofixation showed a monoclonal IgG kappa protein as well as monoclonal free kappa light chains. On 01/31/2014 serum protein electrophoresis showed an M spike of 2.22 g/dL with a total protein of 8.3 and albumin 3.4. Kappa lambda light chains from the serum obtained 02/04/2014 showed 10.50 mg/dL on the, 1.74 in the lambda, with an elevated ratio at 6.03. Beta-2 microglobulin was 7.36. LDH was normal at 184 and HIV antibody was nonreactive.  Biopsy of the manubrial mass 02/03/2014 showed extensive  infiltration of the bone by sheets of atypical plasma cells, positive for CD138, kappa restricted (Charles [name]). Right iliac bone marrow biopsy Charles [name] 05/21/2014 (FZB 16-1) showed an average of 12% plasma cells with numerous aggregates and sheets of atypical plasma cells, which were kappa restricted. Cytogenetic analysis is pending  The patient's subsequent history is as detailed below  INTERVAL HISTORY: Charles [name] returns today for an unscheduled visit. Recently he developed severe mouth pain which took him to the emergency room. He was treated with penicillin orally 4 times a day and the pain in his tooth subsided. Today though he dropped in because he has a rash in his feet and also in his groin that he wanted [place] to look at.   He continues on lenalidomide which is tolerating well.  REVIEW OF SYSTEMS: Aside from the rash in question a detailed review of systems today was entirely stable.  PAST MEDICAL HISTORY: Past Medical History  Diagnosis Date  . IgG myeloma   . CKD (chronic kidney disease), stage III   . Hypercholesterolemia   . Lytic bone lesions on xray     PAST SURGICAL HISTORY: No past surgical history on file.  FAMILY HISTORY No family history on file. The patient's parents are still living, in their late 37's. The patient has 2 brothers, 3 sisters. There is noc cancer in the fmaily to his knowledge.  SOCIAL HISTORY:  Originally from [facility], moved here 19 years ago. He works in Nurse, adult. Married (wife's name is Charles [name]), lives in [place]. He has 5 children ages 46, 72, 51, 9 and 34 months Charles [name], Charles [name]  and Charles [name]) in good health. Best number to call him is [phone]    ADVANCED DIRECTIVES: not  In place   HEALTH MAINTENANCE: History  Substance Use Topics  . Smoking status: Never Smoker   . Smokeless tobacco: Never Used  . Alcohol Use: No     Colonoscopy:  PSA:  Bone density:  Lipid panel:  No Known  Allergies  Current Outpatient Prescriptions  Medication Sig Dispense Refill  . acetaminophen (TYLENOL) 325 MG tablet Take 2 tablets (650 mg total) by mouth every 6 (six) hours as needed for mild pain (or Fever >/= 101). (Patient not taking: Reported on 04/07/2014)    . acyclovir (ZOVIRAX) 400 MG tablet Take 1 tablet (400 mg total) by mouth 2 (two) times daily. 60 tablet 6  . fluconazole (DIFLUCAN) 100 MG tablet Take 1 tablet (100 mg total) by mouth daily. 10 tablet 0  . lenalidomide (REVLIMID) 10 MG capsule Take 1 capsule (10 mg total) by mouth daily. 21 capsule 0  . naproxen (NAPROSYN) 500 MG tablet Take 1 tablet (500 mg total) by mouth 2 (two) times daily with a meal. 30 tablet 0  . sulfamethoxazole-trimethoprim (BACTRIM DS,SEPTRA DS) 800-160 MG per tablet Take 1 tablet by mouth 2 (two) times daily. 20 tablet 0   No current facility-administered medications for this visit.    OBJECTIVE: Charles [name] man who appears stated age There were no vitals filed for this visit.   There is no weight on file to calculate BMI.    ECOG FS:0 - Asymptomatic  There is a superficial peeling rash involving the tips of the toes and the right foot, with minimal erythema, no excoriations.  In both groins there is a scant scattered acne-like rash, non-confluent, with no vesiculation or suppuration  There is no groin adenopathy    LAB RESULTS: Results for Charles [name] (MRN [id number]) as of 06/26/2014 10:28  Ref. Range 03/27/2014 13:48 04/14/2014 13:00 05/12/2014 13:37 06/02/2014 14:34 06/23/2014 14:47  M-SPIKE, % Latest Units: g/dL 0.45 0.36     Results for Charles [name] (MRN [id number]) as of 06/26/2014 10:28  Ref. Range 03/27/2014 13:48 04/14/2014 13:00 05/12/2014 13:37 06/02/2014 14:34 06/23/2014 14:47  Kappa:Lambda Ratio Latest Ref Range: 0.26-1.65  2.37 (H) 2.18 (H) 1.74 (H) 1.24 1.25  Results for Charles [name] (MRN [id number]) as of 06/26/2014 10:28  Ref. Range 03/27/2014 13:48 04/14/2014 13:00  05/12/2014 13:37 06/02/2014 14:34 06/23/2014 14:47  IgG (Immunoglobin G), Serum Latest Ref Range: [id number] mg/dL 887  650  591 (L)    CMP     Component Value Date/Time   NA 142 08/22/2014 1536   NA 135 02/04/2014 0534   K 3.8 08/22/2014 1536   K 4.0 02/04/2014 0534   CL 103 02/04/2014 0534   CO2 23 08/22/2014 1536   CO2 26 02/04/2014 0534   GLUCOSE 118 08/22/2014 1536   GLUCOSE 115* 02/04/2014 0534   BUN 13.1 08/22/2014 1536   BUN 20 02/04/2014 0534   CREATININE 0.7 08/22/2014 1536   CREATININE 1.75* 02/04/2014 0534   CALCIUM 8.8 08/22/2014 1536   CALCIUM 10.6* 02/04/2014 0534   PROT 6.3* 08/22/2014 1536   PROT 7.5 02/04/2014 0534   ALBUMIN 3.3* 08/22/2014 1536   ALBUMIN 1.9* 02/04/2014 0534   AST 18 08/22/2014 1536   AST 24 02/04/2014 0534   ALT 16 08/22/2014 1536   ALT 18 02/04/2014 0534   ALKPHOS 75 08/22/2014 1536   ALKPHOS 102 02/04/2014 0534   BILITOT 0.20 08/22/2014 1536   BILITOT 0.4  02/04/2014 0534   GFRNONAA 47* 02/04/2014 0534   GFRAA 55* 02/04/2014 0534    INo results found for: SPEP, UPEP  Lab Results  Component Value Date   WBC 7.0 08/22/2014   NEUTROABS 3.9 08/22/2014   HGB 12.0* 08/22/2014   HCT 35.6* 08/22/2014   MCV 97.3 08/22/2014   PLT 434* 08/22/2014      Chemistry      Component Value Date/Time   NA 142 08/22/2014 1536   NA 135 02/04/2014 0534   K 3.8 08/22/2014 1536   K 4.0 02/04/2014 0534   CL 103 02/04/2014 0534   CO2 23 08/22/2014 1536   CO2 26 02/04/2014 0534   BUN 13.1 08/22/2014 1536   BUN 20 02/04/2014 0534   CREATININE 0.7 08/22/2014 1536   CREATININE 1.75* 02/04/2014 0534      Component Value Date/Time   CALCIUM 8.8 08/22/2014 1536   CALCIUM 10.6* 02/04/2014 0534   ALKPHOS 75 08/22/2014 1536   ALKPHOS 102 02/04/2014 0534   AST 18 08/22/2014 1536   AST 24 02/04/2014 0534   ALT 16 08/22/2014 1536   ALT 18 02/04/2014 0534   BILITOT 0.20 08/22/2014 1536   BILITOT 0.4 02/04/2014 0534       No results found for:  LABCA2  No components found for: HFGBM211  No results for input(s): INR in the last 168 hours.  Urinalysis    Component Value Date/Time   COLORURINE YELLOW 01/31/2014 1050   APPEARANCEUR CLOUDY* 01/31/2014 1050   LABSPEC 1.019 01/31/2014 1050   PHURINE 6.0 01/31/2014 1050   GLUCOSEU NEGATIVE 01/31/2014 1050   HGBUR LARGE* 01/31/2014 1050   BILIRUBINUR NEGATIVE 01/31/2014 1050   KETONESUR NEGATIVE 01/31/2014 1050   PROTEINUR 100* 01/31/2014 1050   UROBILINOGEN 0.2 01/31/2014 1050   NITRITE NEGATIVE 01/31/2014 1050   LEUKOCYTESUR NEGATIVE 01/31/2014 1050    STUDIES: No results found.  ASSESSMENT: 39 y.o. 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;        I: CyBorD started January 2016  (a) dexamethasone 20 mg/d every TU/WED started 02/04/2014  (b) bortezomib sQ days 1,4,8,11 of every 21 day cycle started 02/10/2014  (c) cyclophosphamide 300 mg/M2 IV weekly started 02/13/2014      2: Lenalidomide 46m daily started 07/26/2014  (2) chronic kidney disease stage III  (3) hypercalcemia: zolendronate started 02/10/2014-- repeat every 4 weeks initially, then every 12 weeks  (4) ID prophylaxis:  (a) influenza and Pneumovax [PV13] vaccines 02/07/2014  (b) acyclovir started 02/07/2014  (c) HIV negative 01/31/2014  (d) Pneumovax P23 ro be given after March 2016  (5). The patient is not a transplant candidate for financial reasons  (6) bilateral groin rash: Started on Septra DS and Diflucan 09/03/2014, to be continued for 10 days.   PLAN: I  think Charles [name] is having fungal infection in his feet and possibly also a fungal infection in his groin area although that could be a form of impetigo. It is only in the groin though. It is bilateral and so it is not going to be shingles. I think to be safe we're going to treat both  bacterial and fungal all possibilities. He a ready has an appointment with [place] in about 2 weeks and we can follow-up at that time.  Otherwise he is doing well from a myeloma point of view and the plan is to continue lenalidomide maintenance indefinitely, until we have evidence of disease progression Amai Cappiello C, MD   09/03/2014 9:09 AM

## 2014-09-04 ENCOUNTER — Other Ambulatory Visit: Payer: Self-pay

## 2014-09-04 ENCOUNTER — Other Ambulatory Visit: Payer: Self-pay | Admitting: *Deleted

## 2014-09-04 DIAGNOSIS — C9000 Multiple myeloma not having achieved remission: Secondary | ICD-10-CM

## 2014-09-04 MED ORDER — FLUCONAZOLE 100 MG PO TABS: 100.0000 mg | ORAL_TABLET | Freq: Every day | ORAL | Status: DC

## 2014-09-05 ENCOUNTER — Telehealth: Payer: Self-pay | Admitting: Oncology

## 2014-09-05 ENCOUNTER — Other Ambulatory Visit: Payer: Self-pay

## 2014-09-05 NOTE — Telephone Encounter (Signed)
Patient stopped by late this afternoon (4:59pm) to r/s today's lab to 8/8 @ 3:15 pm. Patient has new date/time.

## 2014-09-08 ENCOUNTER — Other Ambulatory Visit (HOSPITAL_BASED_OUTPATIENT_CLINIC_OR_DEPARTMENT_OTHER): Payer: Self-pay

## 2014-09-08 DIAGNOSIS — C9000 Multiple myeloma not having achieved remission: Secondary | ICD-10-CM

## 2014-09-08 DIAGNOSIS — D630 Anemia in neoplastic disease: Secondary | ICD-10-CM

## 2014-09-08 DIAGNOSIS — M899 Disorder of bone, unspecified: Secondary | ICD-10-CM

## 2014-09-08 DIAGNOSIS — N183 Chronic kidney disease, stage 3 unspecified: Secondary | ICD-10-CM

## 2014-09-08 LAB — CBC WITH DIFFERENTIAL/PLATELET
BASO%: 1.4 % (ref 0.0–2.0)
BASOS ABS: 0.1 10*3/uL (ref 0.0–0.1)
EOS%: 3.7 % (ref 0.0–7.0)
Eosinophils Absolute: 0.3 10*3/uL (ref 0.0–0.5)
HEMATOCRIT: 41.7 % (ref 38.4–49.9)
HEMOGLOBIN: 14.2 g/dL (ref 13.0–17.1)
LYMPH#: 2 10*3/uL (ref 0.9–3.3)
LYMPH%: 21.5 % (ref 14.0–49.0)
MCH: 32.8 pg (ref 27.2–33.4)
MCHC: 34.1 g/dL (ref 32.0–36.0)
MCV: 96.2 fL (ref 79.3–98.0)
MONO#: 1 10*3/uL — ABNORMAL HIGH (ref 0.1–0.9)
MONO%: 10.4 % (ref 0.0–14.0)
NEUT%: 63 % (ref 39.0–75.0)
NEUTROS ABS: 5.7 10*3/uL (ref 1.5–6.5)
Platelets: 349 10*3/uL (ref 140–400)
RBC: 4.34 10*6/uL (ref 4.20–5.82)
RDW: 14 % (ref 11.0–14.6)
WBC: 9.1 10*3/uL (ref 4.0–10.3)

## 2014-09-15 ENCOUNTER — Other Ambulatory Visit: Payer: Self-pay | Admitting: *Deleted

## 2014-09-15 DIAGNOSIS — C9000 Multiple myeloma not having achieved remission: Secondary | ICD-10-CM

## 2014-09-15 MED ORDER — LENALIDOMIDE 10 MG PO CAPS: 10.0000 mg | ORAL_CAPSULE | Freq: Every day | ORAL | Status: DC

## 2014-09-19 ENCOUNTER — Encounter: Payer: Self-pay | Admitting: Nurse Practitioner

## 2014-09-19 ENCOUNTER — Other Ambulatory Visit: Payer: Self-pay

## 2014-09-19 ENCOUNTER — Ambulatory Visit (HOSPITAL_BASED_OUTPATIENT_CLINIC_OR_DEPARTMENT_OTHER): Payer: Self-pay | Admitting: Nurse Practitioner

## 2014-09-19 ENCOUNTER — Other Ambulatory Visit (HOSPITAL_BASED_OUTPATIENT_CLINIC_OR_DEPARTMENT_OTHER): Payer: Self-pay

## 2014-09-19 VITALS — BP 120/77 | HR 64 | Temp 98.3°F | Resp 18 | Wt 161.5 lb

## 2014-09-19 DIAGNOSIS — N183 Chronic kidney disease, stage 3 unspecified: Secondary | ICD-10-CM

## 2014-09-19 DIAGNOSIS — R21 Rash and other nonspecific skin eruption: Secondary | ICD-10-CM

## 2014-09-19 DIAGNOSIS — D630 Anemia in neoplastic disease: Secondary | ICD-10-CM

## 2014-09-19 DIAGNOSIS — C9000 Multiple myeloma not having achieved remission: Secondary | ICD-10-CM

## 2014-09-19 DIAGNOSIS — M899 Disorder of bone, unspecified: Secondary | ICD-10-CM

## 2014-09-19 LAB — CBC WITH DIFFERENTIAL/PLATELET
BASO%: 1 % (ref 0.0–2.0)
BASOS ABS: 0.1 10*3/uL (ref 0.0–0.1)
EOS ABS: 0.2 10*3/uL (ref 0.0–0.5)
EOS%: 2.7 % (ref 0.0–7.0)
HEMATOCRIT: 39 % (ref 38.4–49.9)
HEMOGLOBIN: 13.2 g/dL (ref 13.0–17.1)
LYMPH#: 2.9 10*3/uL (ref 0.9–3.3)
LYMPH%: 35.7 % (ref 14.0–49.0)
MCH: 32.9 pg (ref 27.2–33.4)
MCHC: 33.7 g/dL (ref 32.0–36.0)
MCV: 97.6 fL (ref 79.3–98.0)
MONO#: 0.8 10*3/uL (ref 0.1–0.9)
MONO%: 9.9 % (ref 0.0–14.0)
NEUT#: 4.1 10*3/uL (ref 1.5–6.5)
NEUT%: 50.7 % (ref 39.0–75.0)
Platelets: 365 10*3/uL (ref 140–400)
RBC: 4 10*6/uL — ABNORMAL LOW (ref 4.20–5.82)
RDW: 13.9 % (ref 11.0–14.6)
WBC: 8.2 10*3/uL (ref 4.0–10.3)

## 2014-09-19 LAB — COMPREHENSIVE METABOLIC PANEL (CC13)
ALT: 23 U/L (ref 0–55)
AST: 21 U/L (ref 5–34)
Albumin: 3.5 g/dL (ref 3.5–5.0)
Alkaline Phosphatase: 94 U/L (ref 40–150)
Anion Gap: 10 mEq/L (ref 3–11)
BUN: 13.8 mg/dL (ref 7.0–26.0)
CALCIUM: 9.3 mg/dL (ref 8.4–10.4)
CHLORIDE: 110 meq/L — AB (ref 98–109)
CO2: 22 meq/L (ref 22–29)
CREATININE: 0.8 mg/dL (ref 0.7–1.3)
Glucose: 90 mg/dl (ref 70–140)
Potassium: 3.7 mEq/L (ref 3.5–5.1)
Sodium: 142 mEq/L (ref 136–145)
Total Protein: 6.9 g/dL (ref 6.4–8.3)

## 2014-09-19 MED ORDER — FLUCONAZOLE 100 MG PO TABS: 100.0000 mg | ORAL_TABLET | Freq: Every day | ORAL | Status: DC

## 2014-09-19 MED ORDER — SULFAMETHOXAZOLE-TRIMETHOPRIM 800-160 MG PO TABS
1.0000 | ORAL_TABLET | Freq: Two times a day (BID) | ORAL | Status: DC
Start: 2014-09-19 — End: 2014-10-15

## 2014-09-19 NOTE — Progress Notes (Signed)
[facility]  Telephone:(336) [phone] Fax:(336) [phone]     ID: Charles [name] DOB: 03/18/1975  MR#: [id number]  NGB#:[id number]  Patient Care Team: Charles Garter, MD as PCP - General (Internal Medicine) PCP: Charles Chessman, MD GYN: SU:  OTHER MD:  CHIEF COMPLAINT: multiple myeloma  CURRENT TREATMENT: Lenalidomide  HISTORY  OF MULTIPLE MYELOMA: From the original intake note January 2016:  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 The pain was worse with coughing. Exam was unremarkable, and he was treated with Mobic. No labs were obtained. On 01/30/2014 he presented to the emergency room at [facility] with similar complaints and also reported a 10 pound weight loss over the past month. A chest x-ray was significant only for mild atelectasis, but ACT NGO of the chest 01/30/2014, while it showed no pulmonary emboli, showed an osteolytic lesion destroying the manubrium of the sternum. There was extra osseous extension into the anterior mediastinum.  The patient was admitted and found to have a creatinine of 1.64 with a GFR of 51. Bilateral renal ultrasound showed no hydronephrosis or masses, and normal size and cortical thickness of both kidneys, though there was mild diffuse echogenicity. Urine total protein was 145 mg/dL and immunofixation showed a monoclonal IgG kappa protein as well as monoclonal free kappa light chains. On 01/31/2014 serum protein electrophoresis showed an M spike of 2.22 g/dL with a total protein of 8.3 and albumin 3.4. Kappa lambda light chains from the serum obtained 02/04/2014 showed 10.50 mg/dL on the, 1.74 in the lambda, with an elevated ratio at 6.03. Beta-2 microglobulin was 7.36. LDH was normal at 184 and HIV antibody was nonreactive.  Biopsy of the manubrial mass 02/03/2014 showed extensive  infiltration of the bone by sheets of atypical plasma cells, positive for CD138, kappa restricted (SZA 16-23). Right iliac bone marrow biopsy Charles [name] 05/21/2014 (FZB 16-1) showed an average of 12% plasma cells with numerous aggregates and sheets of atypical plasma cells, which were kappa restricted. Cytogenetic analysis is pending  The patient's subsequent history is as detailed below  INTERVAL HISTORY: [name] returns today for a follow up visit. He continues on lenalidomide and tolerates this well. At his last visit he was started on septra DS and fluconazole for 10 day for a bilateral groin rash that was irritating. The rash is better, but he was wondering if he could be treated one more time with this combination as it is not completely gone yet. The skin to his toes is healing well.   REVIEW OF SYSTEMS: Aside from the rash in question a detailed review of systems today was entirely stable.  PAST MEDICAL HISTORY: Past Medical History  Diagnosis Date  . IgG myeloma   . CKD (chronic kidney disease), stage III   . Hypercholesterolemia   . Lytic bone lesions on xray     PAST SURGICAL HISTORY: No past surgical history on file.  FAMILY HISTORY No family history on file. The patient's parents are still living, in their late 75's. The patient has 2 brothers, 3 sisters. There is noc cancer in the fmaily to his knowledge.  SOCIAL HISTORY:  Originally from [facility], moved here 19 years ago. He works in Nurse, adult. Married (wife's name is Charles [name]), lives in [place]. He has 5 children ages 37, 69, 42, 81 and 19 months Charles [name], Charles [name],  Charles [name] and Charles [name]) in good health. Best number to call him is [phone]    ADVANCED DIRECTIVES: not  In place   HEALTH MAINTENANCE: Social History  Substance Use Topics  . Smoking status: Never Smoker   . Smokeless tobacco: Never Used  . Alcohol Use: No     Colonoscopy:  PSA:  Bone density:  Lipid  panel:  No Known Allergies  Current Outpatient Prescriptions  Medication Sig Dispense Refill  . acyclovir (ZOVIRAX) 400 MG tablet Take 1 tablet (400 mg total) by mouth 2 (two) times daily. 60 tablet 6  . fluconazole (DIFLUCAN) 100 MG tablet Take 1 tablet (100 mg total) by mouth daily. 10 tablet 0  . lenalidomide (REVLIMID) 10 MG capsule Take 1 capsule (10 mg total) by mouth daily. 21 capsule 0  . acetaminophen (TYLENOL) 325 MG tablet Take 2 tablets (650 mg total) by mouth every 6 (six) hours as needed for mild pain (or Fever >/= 101). (Patient not taking: Reported on 04/07/2014)    . naproxen (NAPROSYN) 500 MG tablet Take 1 tablet (500 mg total) by mouth 2 (two) times daily with a meal. (Patient not taking: Reported on 09/19/2014) 30 tablet 0  . sulfamethoxazole-trimethoprim (BACTRIM DS,SEPTRA DS) 800-160 MG per tablet Take 1 tablet by mouth 2 (two) times daily. 20 tablet 0   No current facility-administered medications for this visit.    OBJECTIVE: Charles [name] man who appears stated age 39 Vitals:   09/19/14 1520  BP: 120/77  Pulse: 64  Temp: 98.3 F (36.8 C)  Resp: 18     Body mass index is 29.53 kg/(m^2).    ECOG FS:0 - Asymptomatic   Skin: bilateral groin dark rash. No papules or vesicles HEENT: sclerae anicteric, conjunctivae pink, oropharynx clear. No thrush or mucositis.  Lymph Nodes: No cervical or supraclavicular lymphadenopathy  Lungs: clear to auscultation bilaterally, no rales, wheezes, or rhonci  Heart: regular rate and rhythm  Abdomen: round, soft, non tender, positive bowel sounds  Musculoskeletal: No focal spinal tenderness, no peripheral edema  Neuro: non focal, well oriented, positive affect   LAB RESULTS:  Results for Charles, [name] (MRN [id number]) as of 09/19/2014 16:12  Ref. Range 06/02/2014 14:34 06/23/2014 14:47 07/25/2014 15:50 08/08/2014 14:56 08/22/2014 15:36  Kappa:Lambda Ratio Latest Ref Range: 0.26-1.65  1.24 1.25 1.63  1.63  Results for  Charles, [name] (MRN [id number]) as of 09/19/2014 16:12  Ref. Range 05/12/2014 13:37 05/22/2014 15:21 06/02/2014 14:34 06/23/2014 14:47 07/25/2014 15:50 08/08/2014 14:56  IgG (Immunoglobin G), Serum Latest Ref Range: [phone] mg/dL 650   591 (L)  925    CMP     Component Value Date/Time   NA 142 09/19/2014 1509   NA 135 02/04/2014 0534   K 3.7 09/19/2014 1509   K 4.0 02/04/2014 0534   CL 103 02/04/2014 0534   CO2 22 09/19/2014 1509   CO2 26 02/04/2014 0534   GLUCOSE 90 09/19/2014 1509   GLUCOSE 115* 02/04/2014 0534   BUN 13.8 09/19/2014 1509   BUN 20 02/04/2014 0534   CREATININE 0.8 09/19/2014 1509   CREATININE 1.75* 02/04/2014 0534   CALCIUM 9.3 09/19/2014 1509   CALCIUM 10.6* 02/04/2014 0534   PROT 6.9 09/19/2014 1509   PROT 7.5 02/04/2014 0534   ALBUMIN 3.5 09/19/2014 1509   ALBUMIN 1.9* 02/04/2014 0534   AST 21 09/19/2014 1509   AST 24 02/04/2014 0534   ALT 23 09/19/2014 1509   ALT 18 02/04/2014 0534   ALKPHOS 94 09/19/2014 1509  ALKPHOS 102 02/04/2014 0534   BILITOT <0.20 09/19/2014 1509   BILITOT 0.4 02/04/2014 0534   GFRNONAA 47* 02/04/2014 0534   GFRAA 55* 02/04/2014 0534    INo results found for: SPEP, UPEP  Lab Results  Component Value Date   WBC 8.2 09/19/2014   NEUTROABS 4.1 09/19/2014   HGB 13.2 09/19/2014   HCT 39.0 09/19/2014   MCV 97.6 09/19/2014   PLT 365 09/19/2014      Chemistry      Component Value Date/Time   NA 142 09/19/2014 1509   NA 135 02/04/2014 0534   K 3.7 09/19/2014 1509   K 4.0 02/04/2014 0534   CL 103 02/04/2014 0534   CO2 22 09/19/2014 1509   CO2 26 02/04/2014 0534   BUN 13.8 09/19/2014 1509   BUN 20 02/04/2014 0534   CREATININE 0.8 09/19/2014 1509   CREATININE 1.75* 02/04/2014 0534      Component Value Date/Time   CALCIUM 9.3 09/19/2014 1509   CALCIUM 10.6* 02/04/2014 0534   ALKPHOS 94 09/19/2014 1509   ALKPHOS 102 02/04/2014 0534   AST 21 09/19/2014 1509   AST 24 02/04/2014 0534   ALT 23 09/19/2014 1509   ALT 18  02/04/2014 0534   BILITOT <0.20 09/19/2014 1509   BILITOT 0.4 02/04/2014 0534       No results found for: LABCA2  No components found for: LABCA125  No results for input(s): INR in the last 168 hours.  Urinalysis    Component Value Date/Time   COLORURINE YELLOW 01/31/2014 1050   APPEARANCEUR CLOUDY* 01/31/2014 1050   LABSPEC 1.019 01/31/2014 1050   PHURINE 6.0 01/31/2014 1050   GLUCOSEU NEGATIVE 01/31/2014 1050   HGBUR LARGE* 01/31/2014 1050   BILIRUBINUR NEGATIVE 01/31/2014 1050   KETONESUR NEGATIVE 01/31/2014 1050   PROTEINUR 100* 01/31/2014 1050   UROBILINOGEN 0.2 01/31/2014 1050   NITRITE NEGATIVE 01/31/2014 1050   LEUKOCYTESUR NEGATIVE 01/31/2014 1050    STUDIES: No results found.  ASSESSMENT: 39 y.o. 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;        I: CyBorD started January 2016  (a) dexamethasone 20 mg/d every TU/WED started 02/04/2014  (b) bortezomib sQ days 1,4,8,11 of every 21 day cycle started 02/10/2014  (c) cyclophosphamide 300 mg/M2 IV weekly started 02/13/2014      2: Lenalidomide 31m daily started 07/26/2014  (2) chronic kidney disease stage III  (3) hypercalcemia: zolendronate started 02/10/2014-- repeat every 4 weeks initially, then every 12 weeks  (4) ID prophylaxis:  (a) influenza and Pneumovax [PV13] vaccines 02/07/2014  (b) acyclovir started 02/07/2014  (c) HIV negative 01/31/2014  (d) Pneumovax P23 ro be given after March 2016  (5). The patient is not a transplant candidate for financial reasons  (6)  bilateral groin rash: Started on Septra DS and Diflucan 09/03/2014, to be continued for 10 days.   PLAN: Sayf is doing well today. The labs were reviewed in detail and were entirely stable. He will start his next cycle of lenalidomide this upcoming Sunday.   He is going to have one more course of the septra and diflucan for the  next 10 days and hopefully this will resolve his groin rash altogether.   Otherwise he is doing well from a myeloma point of view and the plan is to continue lenalidomide maintenance indefinitely, until we have evidence of disease progression.  Laurie Panda, NP   09/19/2014 4:17 PM

## 2014-09-20 LAB — PROTEIN / CREATININE RATIO, URINE
CREATININE, URINE: 93.9 mg/dL
PROTEIN CREATININE RATIO: 0.75 — AB (ref <0.15)
TOTAL PROTEIN, URINE: 70 mg/dL — AB (ref 5–25)

## 2014-09-23 LAB — PROTEIN ELECTROPHORESIS, SERUM
ABNORMAL PROTEIN BAND1: 0.2 g/dL
ALPHA-2-GLOBULIN: 0.6 g/dL (ref 0.5–0.9)
Albumin ELP: 3.1 g/dL — ABNORMAL LOW (ref 3.8–4.8)
Alpha-1-Globulin: 0.2 g/dL (ref 0.2–0.3)
BETA 2: 0.2 g/dL (ref 0.2–0.5)
BETA GLOBULIN: 0.4 g/dL (ref 0.4–0.6)
Gamma Globulin: 0.9 g/dL (ref 0.8–1.7)
TOTAL PROTEIN, SERUM ELECTROPHOR: 5.4 g/dL — AB (ref 6.1–8.1)

## 2014-09-23 LAB — IGG, IGA, IGM
IgA: 237 mg/dL (ref 68–379)
IgG (Immunoglobin G), Serum: 1160 mg/dL (ref 650–1600)
IgM, Serum: 75 mg/dL (ref 41–251)

## 2014-09-23 LAB — KAPPA/LAMBDA LIGHT CHAINS
KAPPA LAMBDA RATIO: 1.54 (ref 0.26–1.65)
Kappa free light chain: 3.44 mg/dL — ABNORMAL HIGH (ref 0.33–1.94)
LAMBDA FREE LGHT CHN: 2.24 mg/dL (ref 0.57–2.63)

## 2014-10-03 ENCOUNTER — Other Ambulatory Visit (HOSPITAL_BASED_OUTPATIENT_CLINIC_OR_DEPARTMENT_OTHER): Payer: Self-pay

## 2014-10-03 DIAGNOSIS — N183 Chronic kidney disease, stage 3 unspecified: Secondary | ICD-10-CM

## 2014-10-03 DIAGNOSIS — C9000 Multiple myeloma not having achieved remission: Secondary | ICD-10-CM

## 2014-10-03 DIAGNOSIS — D630 Anemia in neoplastic disease: Secondary | ICD-10-CM

## 2014-10-03 DIAGNOSIS — M899 Disorder of bone, unspecified: Secondary | ICD-10-CM

## 2014-10-03 LAB — CBC WITH DIFFERENTIAL/PLATELET
BASO%: 1.2 % (ref 0.0–2.0)
BASOS ABS: 0.1 10*3/uL (ref 0.0–0.1)
EOS ABS: 0.4 10*3/uL (ref 0.0–0.5)
EOS%: 4.6 % (ref 0.0–7.0)
HEMATOCRIT: 40.2 % (ref 38.4–49.9)
HEMOGLOBIN: 13.4 g/dL (ref 13.0–17.1)
LYMPH%: 23.6 % (ref 14.0–49.0)
MCH: 32.1 pg (ref 27.2–33.4)
MCHC: 33.3 g/dL (ref 32.0–36.0)
MCV: 96.4 fL (ref 79.3–98.0)
MONO#: 0.9 10*3/uL (ref 0.1–0.9)
MONO%: 10.1 % (ref 0.0–14.0)
NEUT#: 5.3 10*3/uL (ref 1.5–6.5)
NEUT%: 60.5 % (ref 39.0–75.0)
PLATELETS: 336 10*3/uL (ref 140–400)
RBC: 4.17 10*6/uL — ABNORMAL LOW (ref 4.20–5.82)
RDW: 14 % (ref 11.0–14.6)
WBC: 8.8 10*3/uL (ref 4.0–10.3)
lymph#: 2.1 10*3/uL (ref 0.9–3.3)

## 2014-10-03 LAB — BASIC METABOLIC PANEL (CC13)
Anion Gap: 7 mEq/L (ref 3–11)
BUN: 11.9 mg/dL (ref 7.0–26.0)
CALCIUM: 9.5 mg/dL (ref 8.4–10.4)
CO2: 25 mEq/L (ref 22–29)
Chloride: 107 mEq/L (ref 98–109)
Creatinine: 0.8 mg/dL (ref 0.7–1.3)
EGFR: >90 mL/min/{1.73_m2} (ref >90)
GLUCOSE: 89 mg/dL (ref 70–140)
Potassium: 4 mEq/L (ref 3.5–5.1)
Sodium: 140 mEq/L (ref 136–145)

## 2014-10-14 ENCOUNTER — Other Ambulatory Visit: Payer: Self-pay | Admitting: *Deleted

## 2014-10-15 ENCOUNTER — Other Ambulatory Visit: Payer: Self-pay | Admitting: *Deleted

## 2014-10-15 ENCOUNTER — Telehealth: Payer: Self-pay | Admitting: Oncology

## 2014-10-15 ENCOUNTER — Ambulatory Visit (HOSPITAL_BASED_OUTPATIENT_CLINIC_OR_DEPARTMENT_OTHER): Payer: Self-pay | Admitting: Oncology

## 2014-10-15 ENCOUNTER — Other Ambulatory Visit (HOSPITAL_BASED_OUTPATIENT_CLINIC_OR_DEPARTMENT_OTHER): Payer: Self-pay

## 2014-10-15 VITALS — BP 131/77 | HR 81 | Temp 98.4°F | Resp 18 | Ht 62.0 in | Wt 162.9 lb

## 2014-10-15 DIAGNOSIS — C9000 Multiple myeloma not having achieved remission: Secondary | ICD-10-CM

## 2014-10-15 DIAGNOSIS — M899 Disorder of bone, unspecified: Secondary | ICD-10-CM

## 2014-10-15 DIAGNOSIS — N183 Chronic kidney disease, stage 3 unspecified: Secondary | ICD-10-CM

## 2014-10-15 DIAGNOSIS — D630 Anemia in neoplastic disease: Secondary | ICD-10-CM

## 2014-10-15 LAB — COMPREHENSIVE METABOLIC PANEL (CC13)
ALBUMIN: 3.5 g/dL (ref 3.5–5.0)
ALK PHOS: 93 U/L (ref 40–150)
ALT: 29 U/L (ref 0–55)
AST: 27 U/L (ref 5–34)
Anion Gap: 7 mEq/L (ref 3–11)
BUN: 12.7 mg/dL (ref 7.0–26.0)
CALCIUM: 8.9 mg/dL (ref 8.4–10.4)
CO2: 25 mEq/L (ref 22–29)
Chloride: 111 mEq/L — ABNORMAL HIGH (ref 98–109)
Creatinine: 0.8 mg/dL (ref 0.7–1.3)
Glucose: 102 mg/dl (ref 70–140)
Potassium: 3.8 mEq/L (ref 3.5–5.1)
Sodium: 142 mEq/L (ref 136–145)
TOTAL PROTEIN: 6.9 g/dL (ref 6.4–8.3)

## 2014-10-15 LAB — CBC WITH DIFFERENTIAL/PLATELET
BASO%: 0.9 % (ref 0.0–2.0)
Basophils Absolute: 0.1 10*3/uL (ref 0.0–0.1)
EOS ABS: 0.3 10*3/uL (ref 0.0–0.5)
EOS%: 4.9 % (ref 0.0–7.0)
HCT: 40.4 % (ref 38.4–49.9)
HGB: 13.6 g/dL (ref 13.0–17.1)
LYMPH%: 34.7 % (ref 14.0–49.0)
MCH: 32.5 pg (ref 27.2–33.4)
MCHC: 33.7 g/dL (ref 32.0–36.0)
MCV: 96.7 fL (ref 79.3–98.0)
MONO#: 0.7 10*3/uL (ref 0.1–0.9)
MONO%: 11.5 % (ref 0.0–14.0)
NEUT#: 2.7 10*3/uL (ref 1.5–6.5)
NEUT%: 48 % (ref 39.0–75.0)
PLATELETS: 337 10*3/uL (ref 140–400)
RBC: 4.18 10*6/uL — ABNORMAL LOW (ref 4.20–5.82)
RDW: 14.3 % (ref 11.0–14.6)
WBC: 5.7 10*3/uL (ref 4.0–10.3)
lymph#: 2 10*3/uL (ref 0.9–3.3)

## 2014-10-15 LAB — PROTEIN / CREATININE RATIO, URINE
Creatinine, Urine: 206.7 mg/dL
Protein Creatinine Ratio: 0.92 — ABNORMAL HIGH (ref <0.15)
Total Protein, Urine: 191 mg/dL — ABNORMAL HIGH (ref 5–25)

## 2014-10-15 MED ORDER — SULFAMETHOXAZOLE-TRIMETHOPRIM 800-160 MG PO TABS: 1.0000 | ORAL_TABLET | Freq: Two times a day (BID) | ORAL | Status: DC

## 2014-10-15 MED ORDER — LENALIDOMIDE 10 MG PO CAPS: 10.0000 mg | ORAL_CAPSULE | Freq: Every day | ORAL | Status: DC

## 2014-10-15 MED ORDER — FLUCONAZOLE 100 MG PO TABS: 100.0000 mg | ORAL_TABLET | Freq: Every day | ORAL | Status: DC

## 2014-10-15 NOTE — Telephone Encounter (Signed)
Gave avs & appointment September thru January

## 2014-10-15 NOTE — Progress Notes (Signed)
Charles [name]  Telephone:(336) [phone] Fax:(336) [phone]     ID: [place] Charles [name] DOB: Aug 16, 1975  MR#: [id number]  XBO#:[id number]  Patient Care Team: Tresa Garter, MD as PCP - General (Internal Medicine) PCP: [name], MD GYN: SU:  OTHER MD:  CHIEF COMPLAINT: multiple myeloma  CURRENT TREATMENT: Lenalidomide, zolendronate  HISTORY  OF MULTIPLE MYELOMA: From the original intake note January 2016:  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 The pain was worse with coughing. Exam was unremarkable, and he was treated with Mobic. No labs were obtained. On 01/30/2014 he presented to the emergency room at [facility] with similar complaints and also reported a 10 pound weight loss over the past month. A chest x-ray was significant only for mild atelectasis, but ACT NGO of the chest 01/30/2014, while it showed no pulmonary emboli, showed an osteolytic lesion destroying the manubrium of the sternum. There was extra osseous extension into the anterior mediastinum.  The patient was admitted and found to have a creatinine of 1.64 with a GFR of 51. Bilateral renal ultrasound showed no hydronephrosis or masses, and normal size and cortical thickness of both kidneys, though there was mild diffuse echogenicity. Urine total protein was 145 mg/dL and immunofixation showed a monoclonal IgG kappa protein as well as monoclonal free kappa light chains. On 01/31/2014 serum protein electrophoresis showed an M spike of 2.22 g/dL with a total protein of 8.3 and albumin 3.4. Kappa lambda light chains from the serum obtained 02/04/2014 showed 10.50 mg/dL on the, 1.74 in the lambda, with an elevated ratio at 6.03. Beta-2 microglobulin was 7.36. LDH was normal at 184 and HIV antibody was nonreactive.  Biopsy of the manubrial mass 02/03/2014  showed extensive infiltration of the bone by sheets of atypical plasma cells, positive for CD138, kappa restricted (SZA 16-23). Right iliac bone marrow biopsy Charles [name] 05/21/2014 (FZB 16-1) showed an average of 12% plasma cells with numerous aggregates and sheets of atypical plasma cells, which were kappa restricted. Cytogenetic analysis is pending  The patient's subsequent history is as detailed below  INTERVAL HISTORY: [name] returns today for follow-up of his multiple myeloma. He continues on lenalidomide, which he obtains monthly through [facility]. He is tolerating this with no [name] effects that he is aware of including no somnolence, no peripheral neuropathy, and no constipation issues.  REVIEW OF SYSTEMS: The only symptom he reports is a continuing rash on the dorsum of both feet. He tells me that when he receives antibiotics for this (a combination of Septra and Diflucan) it improves. It has not however completely cleared. A detailed review of systems today was otherwise noncontributory  PAST MEDICAL HISTORY: Past Medical History  Diagnosis Date  . IgG myeloma   . CKD (chronic kidney disease), stage III   . Hypercholesterolemia   . Lytic bone lesions on xray     PAST SURGICAL HISTORY: No past surgical history on file.  FAMILY HISTORY No family history on file. The patient's parents are still living, in their late 44's. The patient has 2 brothers, 3 sisters. There is noc cancer in the fmaily to his knowledge.  SOCIAL HISTORY:  Originally from [facility], moved here 19 years ago. He works in Nurse, adult. 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 He has 5 children ages 68, 61, 40, 30 and 6 months (  [name], [name] and [name]) in good health. Best number to call him is [phone]    ADVANCED DIRECTIVES: not  In place   HEALTH MAINTENANCE: Social History  Substance Use Topics  . Smoking status: Never Smoker   .  Smokeless tobacco: Never Used  . Alcohol Use: No     Colonoscopy:  PSA:  Bone density:  Lipid panel:  No Known Allergies  Current Outpatient Prescriptions  Medication Sig Dispense Refill  . acyclovir (ZOVIRAX) 400 MG tablet Take 1 tablet (400 mg total) by mouth 2 (two) times daily. 60 tablet 6  . fluconazole (DIFLUCAN) 100 MG tablet Take 1 tablet (100 mg total) by mouth daily. 10 tablet 0  . lenalidomide (REVLIMID) 10 MG capsule Take 1 capsule (10 mg total) by mouth daily. 21 capsule 0  . sulfamethoxazole-trimethoprim (BACTRIM DS,SEPTRA DS) 800-160 MG per tablet Take 1 tablet by mouth 2 (two) times daily. 20 tablet 0   No current facility-administered medications for this visit.    OBJECTIVE: [name] man in no acute distress Filed Vitals:   10/15/14 0838  BP: 131/77  Pulse: 81  Temp: 98.4 F (36.9 C)  Resp: 18     Body mass index is 29.79 kg/(m^2).    ECOG FS:0 - Asymptomatic   Sclerae unicteric, pupils round and equal Oropharynx clear and moist-- no thrush or other lesions No cervical or supraclavicular adenopathy Lungs no rales or rhonchi Heart regular rate and rhythm Abd soft, nontender, positive bowel sounds MSK no focal spinal tenderness, no joint edema Neuro: nonfocal, well oriented, appropriate affect Skin: Peeling rash over the dorsum of both feet, without erythema or swelling.     LAB RESULTS: A restricted band consistent with monoclonal protein is present.The monoclonal protein peak accounts for 0.2 g/dL of the total0.9 g/dL of protein in the gamma region. Results are consistent with SPE performed on 08/25/2014  Results for Charles [name], Charles [name] (MRN [id number]) as of 10/15/2014 08:53  Ref. Range 06/23/2014 14:47 07/25/2014 15:50 08/08/2014 14:56 08/22/2014 15:36 09/19/2014 15:09  Kappa free light chain Latest Ref Range: 0.33-1.94 mg/dL 2.09 (H) 3.17 (H)  4.94 (H) 3.44 (H)  Lambda Free Lght Chn Latest Ref Range: 0.57-2.63 mg/dL 1.67 1.94  3.03 (H) 2.24    Kappa:Lambda Ratio Latest Ref Range: 0.26-1.65  1.25 1.63  1.63 1.54    CMP     Component Value Date/Time   NA 140 10/03/2014 1515   NA 135 02/04/2014 0534   K 4.0 10/03/2014 1515   K 4.0 02/04/2014 0534   CL 103 02/04/2014 0534   CO2 25 10/03/2014 1515   CO2 26 02/04/2014 0534   GLUCOSE 89 10/03/2014 1515   GLUCOSE 115* 02/04/2014 0534   BUN 11.9 10/03/2014 1515   BUN 20 02/04/2014 0534   CREATININE 0.8 10/03/2014 1515   CREATININE 1.75* 02/04/2014 0534   CALCIUM 9.5 10/03/2014 1515   CALCIUM 10.6* 02/04/2014 0534   PROT 6.9 09/19/2014 1509   PROT 7.5 02/04/2014 0534   ALBUMIN 3.5 09/19/2014 1509   ALBUMIN 1.9* 02/04/2014 0534   AST 21 09/19/2014 1509   AST 24 02/04/2014 0534   ALT 23 09/19/2014 1509   ALT 18 02/04/2014 0534   ALKPHOS 94 09/19/2014 1509   ALKPHOS 102 02/04/2014 0534   BILITOT <0.20 09/19/2014 1509   BILITOT 0.4 02/04/2014 0534   GFRNONAA 47* 02/04/2014 0534   GFRAA 55* 02/04/2014 0534    INo results found for: SPEP, UPEP  Lab Results  Component Value Date  WBC 5.7 10/15/2014   NEUTROABS 2.7 10/15/2014   HGB 13.6 10/15/2014   HCT 40.4 10/15/2014   MCV 96.7 10/15/2014   PLT 337 10/15/2014      Chemistry      Component Value Date/Time   NA 140 10/03/2014 1515   NA 135 02/04/2014 0534   K 4.0 10/03/2014 1515   K 4.0 02/04/2014 0534   CL 103 02/04/2014 0534   CO2 25 10/03/2014 1515   CO2 26 02/04/2014 0534   BUN 11.9 10/03/2014 1515   BUN 20 02/04/2014 0534   CREATININE 0.8 10/03/2014 1515   CREATININE 1.75* 02/04/2014 0534      Component Value Date/Time   CALCIUM 9.5 10/03/2014 1515   CALCIUM 10.6* 02/04/2014 0534   ALKPHOS 94 09/19/2014 1509   ALKPHOS 102 02/04/2014 0534   AST 21 09/19/2014 1509   AST 24 02/04/2014 0534   ALT 23 09/19/2014 1509   ALT 18 02/04/2014 0534   BILITOT <0.20 09/19/2014 1509   BILITOT 0.4 02/04/2014 0534       No results found for: LABCA2  No components found for: LABCA125  No results for  input(s): INR in the last 168 hours.  Urinalysis    Component Value Date/Time   COLORURINE YELLOW 01/31/2014 1050   APPEARANCEUR CLOUDY* 01/31/2014 1050   LABSPEC 1.019 01/31/2014 1050   PHURINE 6.0 01/31/2014 1050   GLUCOSEU NEGATIVE 01/31/2014 1050   HGBUR LARGE* 01/31/2014 1050   BILIRUBINUR NEGATIVE 01/31/2014 1050   KETONESUR NEGATIVE 01/31/2014 1050   PROTEINUR 100* 01/31/2014 1050   UROBILINOGEN 0.2 01/31/2014 1050   NITRITE NEGATIVE 01/31/2014 1050   LEUKOCYTESUR NEGATIVE 01/31/2014 1050    STUDIES: No results found.  ASSESSMENT: 39 y.o. 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;        I: CyBorD started January 2016  (a) dexamethasone 20 mg/d every TU/WED started 02/04/2014  (b) bortezomib sQ days 1,4,8,11 of every 21 day cycle started 02/10/2014  (c) cyclophosphamide 300 mg/M2 IV weekly started 02/13/2014       2: Lenalidomide 43m daily started 07/26/2014  (2) chronic kidney disease stage III  (3) hypercalcemia: zolendronate started 02/10/2014-- repeat every 4 weeks initially, then every 12 weeks  (4) ID prophylaxis:  (a) influenza and Pneumovax [PV13] vaccines 02/07/2014  (b) acyclovir started 02/07/2014  (c) HIV negative 01/31/2014  (d) Pneumovax P23 too be given after March 2016  (5). The patient is not a transplant candidate for financial reasons  (6) bilateral foot rash: Started on Septra DS and Diflucan 09/03/2014, to be continued for 10 days.   PLAN: Stclair continues to tolerate the lenalidomide with essentially  no [name] effects. He obtained sits through [facility] at no cost. He is having a very good response, with a minimal M spike at present, normal immunoglobulin levels, and a normal kappa lambda ratio.  The overall plan is to continue lenalidomide indefinitely until we have evidence of disease progression.  He also receives zolendronate every 3 months. His next dose will be October 14 and then January 5. We will continue to do that every 12 weeks and check  his lab work on an every 12 week basis.  We are treating his foot rash with Diflucan and Septra. I am not sure if this is being really helpful but he feels it is so I refilled those today. If this persists we may try to find a dermatologist who could see him or send him to the foot clinic in [place]. Insurance of course remains a major issue  Masao knows to call for any problems that may develop before his next visit here.  [name], MD   10/15/2014 8:55 AM

## 2014-10-17 ENCOUNTER — Other Ambulatory Visit: Payer: Self-pay

## 2014-10-17 LAB — BETA 2 MICROGLOBULIN, SERUM: Beta-2 Microglobulin: 2.41 mg/L (ref <=2.51)

## 2014-10-20 LAB — PROTEIN ELECTROPHORESIS, SERUM
ABNORMAL PROTEIN BAND1: 0.2 g/dL
ALPHA-1-GLOBULIN: 0.3 g/dL (ref 0.2–0.3)
Albumin ELP: 3.6 g/dL — ABNORMAL LOW (ref 3.8–4.8)
Alpha-2-Globulin: 0.8 g/dL (ref 0.5–0.9)
BETA 2: 0.3 g/dL (ref 0.2–0.5)
Beta Globulin: 0.5 g/dL (ref 0.4–0.6)
GAMMA GLOBULIN: 1.1 g/dL (ref 0.8–1.7)
TOTAL PROTEIN, SERUM ELECTROPHOR: 6.6 g/dL (ref 6.1–8.1)

## 2014-10-20 LAB — IGG, IGA, IGM
IgA: 212 mg/dL (ref 68–379)
IgG (Immunoglobin G), Serum: 1170 mg/dL (ref 650–1600)
IgM, Serum: 78 mg/dL (ref 41–251)

## 2014-10-20 LAB — KAPPA/LAMBDA LIGHT CHAINS
KAPPA LAMBDA RATIO: 1.72 — AB (ref 0.26–1.65)
Kappa free light chain: 4.98 mg/dL — ABNORMAL HIGH (ref 0.33–1.94)
LAMBDA FREE LGHT CHN: 2.9 mg/dL — AB (ref 0.57–2.63)

## 2014-10-31 ENCOUNTER — Other Ambulatory Visit (HOSPITAL_BASED_OUTPATIENT_CLINIC_OR_DEPARTMENT_OTHER): Payer: Self-pay

## 2014-10-31 DIAGNOSIS — M899 Disorder of bone, unspecified: Secondary | ICD-10-CM

## 2014-10-31 DIAGNOSIS — D630 Anemia in neoplastic disease: Secondary | ICD-10-CM

## 2014-10-31 DIAGNOSIS — N183 Chronic kidney disease, stage 3 unspecified: Secondary | ICD-10-CM

## 2014-10-31 DIAGNOSIS — C9000 Multiple myeloma not having achieved remission: Secondary | ICD-10-CM

## 2014-10-31 LAB — CBC WITH DIFFERENTIAL/PLATELET
BASO%: 0.7 % (ref 0.0–2.0)
Basophils Absolute: 0.1 10*3/uL (ref 0.0–0.1)
EOS ABS: 0.4 10*3/uL (ref 0.0–0.5)
EOS%: 4.6 % (ref 0.0–7.0)
HCT: 37.2 % — ABNORMAL LOW (ref 38.4–49.9)
HEMOGLOBIN: 12.4 g/dL — AB (ref 13.0–17.1)
LYMPH#: 2.5 10*3/uL (ref 0.9–3.3)
LYMPH%: 27.8 % (ref 14.0–49.0)
MCH: 32.2 pg (ref 27.2–33.4)
MCHC: 33.3 g/dL (ref 32.0–36.0)
MCV: 96.6 fL (ref 79.3–98.0)
MONO#: 1 10*3/uL — ABNORMAL HIGH (ref 0.1–0.9)
MONO%: 11.6 % (ref 0.0–14.0)
NEUT%: 55.3 % (ref 39.0–75.0)
NEUTROS ABS: 5 10*3/uL (ref 1.5–6.5)
Platelets: 319 10*3/uL (ref 140–400)
RBC: 3.85 10*6/uL — ABNORMAL LOW (ref 4.20–5.82)
RDW: 14.5 % (ref 11.0–14.6)
WBC: 9 10*3/uL (ref 4.0–10.3)

## 2014-11-10 ENCOUNTER — Other Ambulatory Visit: Payer: Self-pay

## 2014-11-10 DIAGNOSIS — C9000 Multiple myeloma not having achieved remission: Secondary | ICD-10-CM

## 2014-11-10 MED ORDER — LENALIDOMIDE 10 MG PO CAPS: 10.0000 mg | ORAL_CAPSULE | Freq: Every day | ORAL | Status: DC

## 2014-11-10 NOTE — Telephone Encounter (Signed)
Revlimid refill sent to [facility] today.  For the future when going into the [facility] site you will need a patient ID# the patient's number is his MRN# [id number]- he does not have a SS#.

## 2014-11-14 ENCOUNTER — Other Ambulatory Visit (HOSPITAL_BASED_OUTPATIENT_CLINIC_OR_DEPARTMENT_OTHER): Payer: Self-pay

## 2014-11-14 ENCOUNTER — Ambulatory Visit (HOSPITAL_BASED_OUTPATIENT_CLINIC_OR_DEPARTMENT_OTHER): Payer: Self-pay

## 2014-11-14 VITALS — BP 122/65 | HR 71 | Temp 98.2°F | Resp 20

## 2014-11-14 DIAGNOSIS — D630 Anemia in neoplastic disease: Secondary | ICD-10-CM

## 2014-11-14 DIAGNOSIS — C9000 Multiple myeloma not having achieved remission: Secondary | ICD-10-CM

## 2014-11-14 DIAGNOSIS — M899 Disorder of bone, unspecified: Secondary | ICD-10-CM

## 2014-11-14 DIAGNOSIS — N183 Chronic kidney disease, stage 3 unspecified: Secondary | ICD-10-CM

## 2014-11-14 LAB — COMPREHENSIVE METABOLIC PANEL (CC13)
ALBUMIN: 3.2 g/dL — AB (ref 3.5–5.0)
ALK PHOS: 84 U/L (ref 40–150)
ALT: 17 U/L (ref 0–55)
AST: 22 U/L (ref 5–34)
Anion Gap: 7 mEq/L (ref 3–11)
BUN: 16.7 mg/dL (ref 7.0–26.0)
CO2: 24 meq/L (ref 22–29)
Calcium: 9 mg/dL (ref 8.4–10.4)
Chloride: 111 mEq/L — ABNORMAL HIGH (ref 98–109)
Creatinine: 0.9 mg/dL (ref 0.7–1.3)
GLUCOSE: 90 mg/dL (ref 70–140)
Potassium: 4.2 mEq/L (ref 3.5–5.1)
SODIUM: 142 meq/L (ref 136–145)
TOTAL PROTEIN: 6.7 g/dL (ref 6.4–8.3)

## 2014-11-14 LAB — CBC WITH DIFFERENTIAL/PLATELET
BASO%: 0.6 % (ref 0.0–2.0)
BASOS ABS: 0 10*3/uL (ref 0.0–0.1)
EOS%: 2.7 % (ref 0.0–7.0)
Eosinophils Absolute: 0.2 10*3/uL (ref 0.0–0.5)
HEMATOCRIT: 37.9 % — AB (ref 38.4–49.9)
HGB: 12.6 g/dL — ABNORMAL LOW (ref 13.0–17.1)
LYMPH#: 2.8 10*3/uL (ref 0.9–3.3)
LYMPH%: 38.6 % (ref 14.0–49.0)
MCH: 32.1 pg (ref 27.2–33.4)
MCHC: 33.2 g/dL (ref 32.0–36.0)
MCV: 96.6 fL (ref 79.3–98.0)
MONO#: 0.7 10*3/uL (ref 0.1–0.9)
MONO%: 9.5 % (ref 0.0–14.0)
NEUT#: 3.6 10*3/uL (ref 1.5–6.5)
NEUT%: 48.6 % (ref 39.0–75.0)
PLATELETS: 402 10*3/uL — AB (ref 140–400)
RBC: 3.92 10*6/uL — AB (ref 4.20–5.82)
RDW: 15.5 % — ABNORMAL HIGH (ref 11.0–14.6)
WBC: 7.3 10*3/uL (ref 4.0–10.3)

## 2014-11-14 MED ORDER — ZOLEDRONIC ACID 4 MG/100ML IV SOLN
4.0000 mg | Freq: Once | INTRAVENOUS | Status: AC
  Administered 2014-11-14: 4 mg via INTRAVENOUS
  Filled 2014-11-14: qty 100

## 2014-11-14 MED ORDER — SODIUM CHLORIDE 0.9 % IV SOLN
Freq: Once | INTRAVENOUS | Status: AC
  Administered 2014-11-14: 16:00:00 via INTRAVENOUS

## 2014-11-14 NOTE — Telephone Encounter (Signed)
[facility] sent facsimile confirmation of Revlimid prescription shipment.  Revlimis was shipped on 11-13-2014 with next business day delivery.

## 2014-11-14 NOTE — Patient Instructions (Signed)

## 2014-11-15 LAB — PROTEIN / CREATININE RATIO, URINE
Creatinine, Urine: 110.4 mg/dL
Protein Creatinine Ratio: 0.66 — ABNORMAL HIGH (ref <0.15)
TOTAL PROTEIN, URINE: 73 mg/dL — AB (ref 5–25)

## 2014-11-18 LAB — PROTEIN ELECTROPHORESIS, SERUM
ALBUMIN ELP: 3.4 g/dL — AB (ref 3.8–4.8)
Abnormal Protein Band1: 0.4 g/dL
Alpha-1-Globulin: 0.3 g/dL (ref 0.2–0.3)
Alpha-2-Globulin: 0.9 g/dL (ref 0.5–0.9)
BETA GLOBULIN: 0.4 g/dL (ref 0.4–0.6)
Beta 2: 0.3 g/dL (ref 0.2–0.5)
Gamma Globulin: 1.1 g/dL (ref 0.8–1.7)
TOTAL PROTEIN, SERUM ELECTROPHOR: 6.4 g/dL (ref 6.1–8.1)

## 2014-11-18 LAB — KAPPA/LAMBDA LIGHT CHAINS
KAPPA FREE LGHT CHN: 4.19 mg/dL — AB (ref 0.33–1.94)
Kappa:Lambda Ratio: 1.61 (ref 0.26–1.65)
Lambda Free Lght Chn: 2.6 mg/dL (ref 0.57–2.63)

## 2014-11-28 ENCOUNTER — Other Ambulatory Visit (HOSPITAL_BASED_OUTPATIENT_CLINIC_OR_DEPARTMENT_OTHER): Payer: Self-pay

## 2014-11-28 DIAGNOSIS — D630 Anemia in neoplastic disease: Secondary | ICD-10-CM

## 2014-11-28 DIAGNOSIS — N183 Chronic kidney disease, stage 3 unspecified: Secondary | ICD-10-CM

## 2014-11-28 DIAGNOSIS — M899 Disorder of bone, unspecified: Secondary | ICD-10-CM

## 2014-11-28 DIAGNOSIS — C9000 Multiple myeloma not having achieved remission: Secondary | ICD-10-CM

## 2014-11-28 LAB — CBC WITH DIFFERENTIAL/PLATELET
BASO%: 0.6 % (ref 0.0–2.0)
BASOS ABS: 0.1 10*3/uL (ref 0.0–0.1)
EOS%: 3.3 % (ref 0.0–7.0)
Eosinophils Absolute: 0.3 10*3/uL (ref 0.0–0.5)
HEMATOCRIT: 38.6 % (ref 38.4–49.9)
HGB: 12.7 g/dL — ABNORMAL LOW (ref 13.0–17.1)
LYMPH#: 2.3 10*3/uL (ref 0.9–3.3)
LYMPH%: 23.5 % (ref 14.0–49.0)
MCH: 32.1 pg (ref 27.2–33.4)
MCHC: 32.9 g/dL (ref 32.0–36.0)
MCV: 97.7 fL (ref 79.3–98.0)
MONO#: 0.9 10*3/uL (ref 0.1–0.9)
MONO%: 8.9 % (ref 0.0–14.0)
NEUT#: 6.3 10*3/uL (ref 1.5–6.5)
NEUT%: 63.7 % (ref 39.0–75.0)
Platelets: 358 10*3/uL (ref 140–400)
RBC: 3.95 10*6/uL — AB (ref 4.20–5.82)
RDW: 15.3 % — ABNORMAL HIGH (ref 11.0–14.6)
WBC: 9.8 10*3/uL (ref 4.0–10.3)

## 2014-12-09 ENCOUNTER — Other Ambulatory Visit: Payer: Self-pay

## 2014-12-09 DIAGNOSIS — C9000 Multiple myeloma not having achieved remission: Secondary | ICD-10-CM

## 2014-12-09 MED ORDER — LENALIDOMIDE 10 MG PO CAPS: 10.0000 mg | ORAL_CAPSULE | Freq: Every day | ORAL | Status: DC

## 2014-12-12 ENCOUNTER — Other Ambulatory Visit (HOSPITAL_BASED_OUTPATIENT_CLINIC_OR_DEPARTMENT_OTHER): Payer: Self-pay

## 2014-12-12 DIAGNOSIS — N183 Chronic kidney disease, stage 3 unspecified: Secondary | ICD-10-CM

## 2014-12-12 DIAGNOSIS — M898X9 Other specified disorders of bone, unspecified site: Secondary | ICD-10-CM

## 2014-12-12 DIAGNOSIS — M899 Disorder of bone, unspecified: Secondary | ICD-10-CM

## 2014-12-12 DIAGNOSIS — C9000 Multiple myeloma not having achieved remission: Secondary | ICD-10-CM

## 2014-12-12 DIAGNOSIS — D630 Anemia in neoplastic disease: Secondary | ICD-10-CM

## 2014-12-12 LAB — COMPREHENSIVE METABOLIC PANEL (CC13)
ALBUMIN: 2.7 g/dL — AB (ref 3.5–5.0)
ALK PHOS: 91 U/L (ref 40–150)
ALT: 16 U/L (ref 0–55)
AST: 18 U/L (ref 5–34)
Anion Gap: 8 mEq/L (ref 3–11)
BUN: 14.5 mg/dL (ref 7.0–26.0)
CALCIUM: 8.7 mg/dL (ref 8.4–10.4)
CO2: 23 mEq/L (ref 22–29)
Chloride: 110 mEq/L — ABNORMAL HIGH (ref 98–109)
Creatinine: 0.8 mg/dL (ref 0.7–1.3)
GLUCOSE: 105 mg/dL (ref 70–140)
Potassium: 3.9 mEq/L (ref 3.5–5.1)
SODIUM: 141 meq/L (ref 136–145)
TOTAL PROTEIN: 6.8 g/dL (ref 6.4–8.3)

## 2014-12-12 LAB — CBC WITH DIFFERENTIAL/PLATELET
BASO%: 0.6 % (ref 0.0–2.0)
Basophils Absolute: 0 10*3/uL (ref 0.0–0.1)
EOS%: 1.7 % (ref 0.0–7.0)
Eosinophils Absolute: 0.1 10*3/uL (ref 0.0–0.5)
HEMATOCRIT: 34.7 % — AB (ref 38.4–49.9)
HEMOGLOBIN: 11.5 g/dL — AB (ref 13.0–17.1)
LYMPH#: 2.9 10*3/uL (ref 0.9–3.3)
LYMPH%: 39 % (ref 14.0–49.0)
MCH: 32.4 pg (ref 27.2–33.4)
MCHC: 33.3 g/dL (ref 32.0–36.0)
MCV: 97.4 fL (ref 79.3–98.0)
MONO#: 0.7 10*3/uL (ref 0.1–0.9)
MONO%: 9.1 % (ref 0.0–14.0)
NEUT%: 49.6 % (ref 39.0–75.0)
NEUTROS ABS: 3.8 10*3/uL (ref 1.5–6.5)
PLATELETS: 465 10*3/uL — AB (ref 140–400)
RBC: 3.56 10*6/uL — ABNORMAL LOW (ref 4.20–5.82)
RDW: 15 % — ABNORMAL HIGH (ref 11.0–14.6)
WBC: 7.6 10*3/uL (ref 4.0–10.3)

## 2014-12-13 LAB — PROTEIN / CREATININE RATIO, URINE
Creatinine, Urine: 95 mg/dL (ref 20–370)
PROTEIN CREATININE RATIO: 737 mg/g{creat} — AB (ref 22–128)
TOTAL PROTEIN, URINE: 70 mg/dL — AB (ref 5–25)

## 2014-12-16 LAB — PROTEIN ELECTROPHORESIS, SERUM
ALPHA-1-GLOBULIN: 0.4 g/dL — AB (ref 0.2–0.3)
ALPHA-2-GLOBULIN: 1.1 g/dL — AB (ref 0.5–0.9)
Abnormal Protein Band1: 0.7 g/dL
Albumin ELP: 3 g/dL — ABNORMAL LOW (ref 3.8–4.8)
Beta 2: 0.3 g/dL (ref 0.2–0.5)
Beta Globulin: 0.4 g/dL (ref 0.4–0.6)
GAMMA GLOBULIN: 1.3 g/dL (ref 0.8–1.7)
Total Protein, Serum Electrophoresis: 6.5 g/dL (ref 6.1–8.1)

## 2014-12-16 LAB — KAPPA/LAMBDA LIGHT CHAINS
KAPPA FREE LGHT CHN: 5.15 mg/dL — AB (ref 0.33–1.94)
Kappa:Lambda Ratio: 2.1 — ABNORMAL HIGH (ref 0.26–1.65)
Lambda Free Lght Chn: 2.45 mg/dL (ref 0.57–2.63)

## 2014-12-16 LAB — IGG, IGA, IGM
IGA: 260 mg/dL (ref 68–379)
IgG (Immunoglobin G), Serum: 1210 mg/dL (ref 650–1600)
IgM, Serum: 65 mg/dL (ref 41–251)

## 2014-12-18 ENCOUNTER — Ambulatory Visit (HOSPITAL_BASED_OUTPATIENT_CLINIC_OR_DEPARTMENT_OTHER): Payer: Self-pay | Admitting: Nurse Practitioner

## 2014-12-18 ENCOUNTER — Encounter: Payer: Self-pay | Admitting: Nurse Practitioner

## 2014-12-18 ENCOUNTER — Telehealth: Payer: Self-pay | Admitting: *Deleted

## 2014-12-18 ENCOUNTER — Other Ambulatory Visit: Payer: Self-pay | Admitting: Nurse Practitioner

## 2014-12-18 DIAGNOSIS — H00011 Hordeolum externum right upper eyelid: Secondary | ICD-10-CM

## 2014-12-18 DIAGNOSIS — C9000 Multiple myeloma not having achieved remission: Secondary | ICD-10-CM

## 2014-12-18 DIAGNOSIS — H00013 Hordeolum externum right eye, unspecified eyelid: Secondary | ICD-10-CM

## 2014-12-18 MED ORDER — ERYTHROMYCIN 5 MG/GM OP OINT: 1.0000 "application " | TOPICAL_OINTMENT | Freq: Four times a day (QID) | OPHTHALMIC | Status: DC

## 2014-12-18 NOTE — Assessment & Plan Note (Signed)
Patient reports a 10 day history of a lesion to his right upper eyelid.  He complains of slight discomfort to the site; but no burning or stinging.  He also denies any vision changes whatsoever.  He denies any recent fevers or chills.  On exam it appears that pt has a right upper eyelid hordeolum.    Patient was advised to use warm, moist compresses to the site between 3 and 6 times per day.  He was also given a prescription for erythromycin ointment to apply to the site.  He was advised not to let the tip of the ointment tube touch.  The site; and to also wash his hands well following each time he touches his eyes.  Patient was also advised to follow-up with ophthalmologist if the site does not improve.  Engineer, civil (consulting) were also reviewed with the patient and sent home with him.  Patient speaks only moderate English; but stated full understanding of all instructions today.  Also, patient confirmed that his son at home can review all instructions with patient as well.  Patient was advised to call/return or directly to the emergency department for any worsening symptoms whatsoever.

## 2014-12-18 NOTE — Assessment & Plan Note (Signed)
Patient continues to take his Revlimid oral therapy as directed.  He last received Zometa infusion on 11/14/2014.  Patient is scheduled to return 02/05/2015 prolapse, visit, and his next Zometa infusion.

## 2014-12-18 NOTE — Progress Notes (Signed)
SYMPTOM MANAGEMENT CLINIC   HPI: Charles [name] 39 y.o. male diagnosed with multiple myeloma; with bone metastasis.  Patient is currently undergoing Revlimid oral therapy and Zometa infusions on an every three-month basis.  Patient reports a 10 day history of a lesion to his right upper eyelid.  He complains of slight discomfort to the site; but no burning or stinging.  He also denies any vision changes whatsoever.  He denies any recent fevers or chills.  HPI  ROS  Past Medical History  Diagnosis Date  . 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 CKD (chronic kidney disease), stage III   . Hypercholesterolemia   . Lytic bone lesions on xray     History reviewed. No pertinent past surgical history.  has Lytic bone lesions on xray; Multiple myeloma (HCC); CKD (chronic kidney disease) stage 3, GFR 30-59 ml/min; Anemia in neoplastic disease; and Hordeolum externum of right eye on his problem list.    has No Known Allergies.    Medication List       This list is accurate as of: 12/18/14 11:10 AM.  Always use your most recent med list.               acyclovir 400 MG tablet  Commonly known as:  ZOVIRAX  Take 1 tablet (400 mg total) by mouth 2 (two) times daily.     erythromycin ophthalmic ointment  Place 1 application into the right eye 4 (four) times daily.     fluconazole 100 MG tablet  Commonly known as:  DIFLUCAN  Take 1 tablet (100 mg total) by mouth daily.     lenalidomide 10 MG capsule  Commonly known as:  REVLIMID  Take 1 capsule (10 mg total) by mouth daily.     sulfamethoxazole-trimethoprim 800-160 MG tablet  Commonly known as:  BACTRIM DS,SEPTRA DS  Take 1 tablet by mouth 2 (two) times daily.         PHYSICAL EXAMINATION  Oncology Vitals 12/18/2014 11/14/2014  Height - -  Weight - -  Weight (lbs) - -  BMI (kg/m2) - -  Temp 99 98.2  Pulse 77 71  Resp 18 20  SpO2 99 98  BSA (m2) - -   BP Readings from Last 2 Encounters:  12/18/14 128/69  11/14/14 122/65      Physical Exam  Constitutional: He is oriented to person, place, and time and well-developed, well-nourished, and in no distress.  HENT:  Head: Normocephalic and atraumatic.  Eyes: Conjunctivae and EOM are normal. Pupils are equal, round, and reactive to light. Right eye exhibits no discharge. Left eye exhibits no discharge. No scleral icterus.  Hordeolum to the right upper eyelid.  There is no surrounding erythema, edema, tenderness, warmth, or red streaks.  There is also no discharge from the eye.  Neck: Normal range of motion. Neck supple.  Pulmonary/Chest: Effort normal. No respiratory distress.  Musculoskeletal: Normal range of motion.  Neurological: He is alert and oriented to person, place, and time. Gait normal.  Skin: Skin is warm and dry.  Psychiatric: Affect normal.  Nursing note and vitals reviewed.   LABORATORY DATA:. No visits with results within 3 Day(s) from this visit. Latest known visit with results is:  Appointment on 12/12/2014  Component Date Value Ref Range Status  . WBC 12/12/2014 7.6  4.0 - 10.3 10e3/uL Final  . NEUT# 12/12/2014 3.8  1.5 - 6.5 10e3/uL Final  . HGB 12/12/2014 11.5* 13.0 - 17.1 g/dL Final  . HCT 12/12/2014  34.7* 38.4 - 49.9 % Final  . Platelets 12/12/2014 465* 140 - 400 10e3/uL Final  . MCV 12/12/2014 97.4  79.3 - 98.0 fL Final  . MCH 12/12/2014 32.4  27.2 - 33.4 pg Final  . MCHC 12/12/2014 33.3  32.0 - 36.0 g/dL Final  . RBC 12/12/2014 3.56* 4.20 - 5.82 10e6/uL Final  . RDW 12/12/2014 15.0* 11.0 - 14.6 % Final  . lymph# 12/12/2014 2.9  0.9 - 3.3 10e3/uL Final  . MONO# 12/12/2014 0.7  0.1 - 0.9 10e3/uL Final  . Eosinophils Absolute 12/12/2014 0.1  0.0 - 0.5 10e3/uL Final  . Basophils Absolute 12/12/2014 0.0  0.0 - 0.1 10e3/uL Final  . NEUT% 12/12/2014 49.6  39.0 - 75.0 % Final  . LYMPH% 12/12/2014 39.0  14.0 - 49.0 % Final  . MONO% 12/12/2014 9.1  0.0 - 14.0 % Final  . EOS% 12/12/2014 1.7  0.0 - 7.0 % Final  . BASO% 12/12/2014 0.6   0.0 - 2.0 % Final  . IgG (Immunoglobin G), Serum 12/12/2014 1210  650 - 1600 mg/dL Final  . IgA 12/12/2014 260  68 - 379 mg/dL Final  . IgM, Serum 12/12/2014 65  41 - 251 mg/dL Final  . Kappa free light chain 12/12/2014 5.15* 0.33 - 1.94 mg/dL Final  . Lambda Free Lght Chn 12/12/2014 2.45  0.57 - 2.63 mg/dL Final  . Kappa:Lambda Ratio 12/12/2014 2.10* 0.26 - 1.65 Final  . Creatinine, Urine 12/12/2014 95  20 - 370 mg/dL Final   ** Please note change in reference range(s). **   . Total Protein, Urine 12/12/2014 70* 5 - 25 mg/dL Final   ** Please note change in reference range(s). **   . Protein Creatinine Ratio 12/12/2014 737* 22 - 128 mg/g creat Final   ** Please note change in reference range(s). **   . Total Protein, Serum Electrophores* 12/12/2014 6.5  6.1 - 8.1 g/dL Final  . Albumin ELP 12/12/2014 3.0* 3.8 - 4.8 g/dL Final  . Alpha-1-Globulin 12/12/2014 0.4* 0.2 - 0.3 g/dL Final  . Alpha-2-Globulin 12/12/2014 1.1* 0.5 - 0.9 g/dL Final  . Beta Globulin 12/12/2014 0.4  0.4 - 0.6 g/dL Final  . Beta 2 12/12/2014 0.3  0.2 - 0.5 g/dL Final  . Gamma Globulin 12/12/2014 1.3  0.8 - 1.7 g/dL Final  . Abnormal Protein Band1 12/12/2014 0.7   Final  . SPE Interp. 12/12/2014 *   Final   Comment: A restricted band consistent with monoclonal protein is present.The monoclonal protein peak accounts for 0.7 g/dL of the total1.3 g/dL of protein in the gamma region. Results are consistent with SPE performed on 11/17/14. Reviewed by Odis Hollingshead,  MD, PhD, FCAP (Electronic Signature onFile)   . COMMENT (PROTEIN ELECTROPHOR) 12/12/2014 *   Final   Comment: ---------------Serum protein electrophoresis is a useful screening procedure in thedetection of various pathophysiologic states such as inflammation,gammopathies, protein loss and other dysproteinemias.  Immunofixationelectrophoresis (IFE) is a more  sensitive technique for theidentification of M-proteins found in patients with monoclonalgammopathy of  unknown significance (MGUS), amyloidosis, early ortreated myeloma or macroglobulinemia, solitary plasmacytoma orextramedullary plasmacytoma.   . Abnormal Protein Band2 12/12/2014 NOT DET   Final  . Abnormal Protein Band3 12/12/2014 NOT DET   Final  . Sodium 12/12/2014 141  136 - 145 mEq/L Final  . Potassium 12/12/2014 3.9  3.5 - 5.1 mEq/L Final  . Chloride 12/12/2014 110* 98 - 109 mEq/L Final  . CO2 12/12/2014 23  22 - 29 mEq/L Final  .  Glucose 12/12/2014 105  70 - 140 mg/dl Final   Glucose reference range is for nonfasting patients. Fasting glucose reference range is 70- 100.  Marland Kitchen BUN 12/12/2014 14.5  7.0 - 26.0 mg/dL Final  . Creatinine 12/12/2014 0.8  0.7 - 1.3 mg/dL Final  . Total Bilirubin 12/12/2014 <0.30  0.20 - 1.20 mg/dL Final  . Alkaline Phosphatase 12/12/2014 91  40 - 150 U/L Final  . AST 12/12/2014 18  5 - 34 U/L Final  . ALT 12/12/2014 16  0 - 55 U/L Final  . Total Protein 12/12/2014 6.8  6.4 - 8.3 g/dL Final  . Albumin 12/12/2014 2.7* 3.5 - 5.0 g/dL Final  . Calcium 12/12/2014 8.7  8.4 - 10.4 mg/dL Final  . Anion Gap 12/12/2014 8  3 - 11 mEq/L Final  . EGFR 12/12/2014 >90  >90 ml/min/1.73 m2 Final   eGFR is calculated using the CKD-EPI Creatinine Equation (2009)     RADIOGRAPHIC STUDIES: No results found.  ASSESSMENT/PLAN:    Multiple myeloma ([place]) Patient continues to take his Revlimid oral therapy as directed.  He last received Zometa infusion on 11/14/2014.  Patient is scheduled to return 02/05/2015 prolapse, visit, and his next Zometa infusion.  Hordeolum externum of right eye Patient reports a 10 day history of a lesion to his right upper eyelid.  He complains of slight discomfort to the site; but no burning or stinging.  He also denies any vision changes whatsoever.  He denies any recent fevers or chills.  On exam it appears that pt has a right upper eyelid hordeolum.    Patient was advised to use warm, moist compresses to the site between 3 and 6 times per  day.  He was also given a prescription for erythromycin ointment to apply to the site.  He was advised not to let the tip of the ointment tube touch.  The site; and to also wash his hands well following each time he touches his eyes.  Patient was also advised to follow-up with ophthalmologist if the site does not improve.  Engineer, civil (consulting) were also reviewed with the patient and sent home with him.  Patient speaks only moderate English; but stated full understanding of all instructions today.  Also, patient confirmed that his son at home can review all instructions with patient as well.  Patient was advised to call/return or directly to the emergency department for any worsening symptoms whatsoever.  Patient stated understanding of all instructions; and was in agreement with this plan of care. The patient knows to call the clinic with any problems, questions or concerns.   Review/collaboration with Dr. Jana Hakim regarding all aspects of patient's visit today.   Total time spent with patient was 25 minutes;  with greater than 75 percent of that time spent in face to face counseling regarding patient's symptoms,  and coordination of care and follow up.  Disclaimer:This dictation was prepared with Dragon/digital dictation along with [employer]. Any transcriptional errors that result from this process are unintentional.  Drue Second, NP 12/18/2014

## 2014-12-18 NOTE — Telephone Encounter (Signed)
Pt filled out a walk in form c/o pain in right eye and visual disturbance. Pt evaluated in waiting room- sty present on right eye lid. POF sent to schedulers.

## 2014-12-26 ENCOUNTER — Telehealth: Payer: Self-pay | Admitting: *Deleted

## 2014-12-26 ENCOUNTER — Ambulatory Visit (HOSPITAL_BASED_OUTPATIENT_CLINIC_OR_DEPARTMENT_OTHER): Payer: Self-pay

## 2014-12-26 ENCOUNTER — Ambulatory Visit (HOSPITAL_BASED_OUTPATIENT_CLINIC_OR_DEPARTMENT_OTHER): Payer: Self-pay | Admitting: Nurse Practitioner

## 2014-12-26 VITALS — BP 150/76 | HR 92 | Temp 99.2°F | Resp 18 | Ht 62.0 in | Wt 156.7 lb

## 2014-12-26 DIAGNOSIS — R112 Nausea with vomiting, unspecified: Secondary | ICD-10-CM

## 2014-12-26 DIAGNOSIS — C9000 Multiple myeloma not having achieved remission: Secondary | ICD-10-CM

## 2014-12-26 DIAGNOSIS — N390 Urinary tract infection, site not specified: Secondary | ICD-10-CM

## 2014-12-26 DIAGNOSIS — R5383 Other fatigue: Secondary | ICD-10-CM

## 2014-12-26 DIAGNOSIS — R319 Hematuria, unspecified: Secondary | ICD-10-CM

## 2014-12-26 DIAGNOSIS — H00013 Hordeolum externum right eye, unspecified eyelid: Secondary | ICD-10-CM

## 2014-12-26 LAB — COMPREHENSIVE METABOLIC PANEL (CC13)
ALBUMIN: 2.4 g/dL — AB (ref 3.5–5.0)
ALK PHOS: 98 U/L (ref 40–150)
ALT: 24 U/L (ref 0–55)
AST: 19 U/L (ref 5–34)
Anion Gap: 8 mEq/L (ref 3–11)
BUN: 12.7 mg/dL (ref 7.0–26.0)
CALCIUM: 9.5 mg/dL (ref 8.4–10.4)
CO2: 26 mEq/L (ref 22–29)
Chloride: 104 mEq/L (ref 98–109)
Creatinine: 1.1 mg/dL (ref 0.7–1.3)
EGFR: 87 mL/min/{1.73_m2} — AB (ref >90)
GLUCOSE: 80 mg/dL (ref 70–140)
Potassium: 3.9 mEq/L (ref 3.5–5.1)
SODIUM: 138 meq/L (ref 136–145)
TOTAL PROTEIN: 7.4 g/dL (ref 6.4–8.3)

## 2014-12-26 LAB — URINALYSIS, MICROSCOPIC - CHCC
Bilirubin (Urine): NEGATIVE
GLUCOSE UR CHCC: NEGATIVE mg/dL
Ketones: NEGATIVE mg/dL
NITRITE: NEGATIVE
PH: 6 (ref 4.6–8.0)
PROTEIN: 100 mg/dL
Specific Gravity, Urine: 1.015 (ref 1.003–1.035)
UROBILINOGEN UR: 0.2 mg/dL (ref 0.2–1)

## 2014-12-26 LAB — CBC WITH DIFFERENTIAL/PLATELET
BASO%: 0.4 % (ref 0.0–2.0)
BASOS ABS: 0 10*3/uL (ref 0.0–0.1)
EOS ABS: 0.2 10*3/uL (ref 0.0–0.5)
EOS%: 1.4 % (ref 0.0–7.0)
HEMATOCRIT: 36.2 % — AB (ref 38.4–49.9)
HEMOGLOBIN: 11.9 g/dL — AB (ref 13.0–17.1)
LYMPH#: 1.8 10*3/uL (ref 0.9–3.3)
LYMPH%: 17.4 % (ref 14.0–49.0)
MCH: 32.2 pg (ref 27.2–33.4)
MCHC: 33 g/dL (ref 32.0–36.0)
MCV: 97.4 fL (ref 79.3–98.0)
MONO#: 1.1 10*3/uL — AB (ref 0.1–0.9)
MONO%: 10.6 % (ref 0.0–14.0)
NEUT%: 70.2 % (ref 39.0–75.0)
NEUTROS ABS: 7.4 10*3/uL — AB (ref 1.5–6.5)
Platelets: 379 10*3/uL (ref 140–400)
RBC: 3.71 10*6/uL — ABNORMAL LOW (ref 4.20–5.82)
RDW: 15.2 % — AB (ref 11.0–14.6)
WBC: 10.5 10*3/uL — AB (ref 4.0–10.3)

## 2014-12-26 MED ORDER — SODIUM CHLORIDE 0.9 % IV SOLN
Freq: Once | INTRAVENOUS | Status: AC
  Administered 2014-12-26: 16:00:00 via INTRAVENOUS
  Filled 2014-12-26: qty 4

## 2014-12-26 MED ORDER — AMOXICILLIN-POT CLAVULANATE 875-125 MG PO TABS: 1.0000 | ORAL_TABLET | Freq: Two times a day (BID) | ORAL | Status: DC

## 2014-12-26 MED ORDER — ONDANSETRON HCL 8 MG PO TABS: 8.0000 mg | ORAL_TABLET | Freq: Three times a day (TID) | ORAL | Status: DC | PRN

## 2014-12-26 MED ORDER — SODIUM CHLORIDE 0.9 % IV SOLN
1000.0000 mL | Freq: Once | INTRAVENOUS | Status: AC
  Administered 2014-12-26: 1000 mL via INTRAVENOUS

## 2014-12-26 NOTE — Telephone Encounter (Signed)
Pt came in to [facility] with complaints of generalized fatigue, bone pain. Nausea and some vomiting. Pt reports no appetite for 2-3 days.

## 2014-12-26 NOTE — Patient Instructions (Signed)

## 2014-12-27 ENCOUNTER — Encounter (HOSPITAL_COMMUNITY): Payer: Self-pay | Admitting: Emergency Medicine

## 2014-12-27 ENCOUNTER — Emergency Department (HOSPITAL_COMMUNITY)
Admission: EM | Admit: 2014-12-27 | Discharge: 2014-12-28 | Disposition: A | Discharge status: Home / Self Care | Payer: Self-pay | Attending: Emergency Medicine | Admitting: Emergency Medicine

## 2014-12-27 DIAGNOSIS — R61 Generalized hyperhidrosis: Secondary | ICD-10-CM | POA: Insufficient documentation

## 2014-12-27 DIAGNOSIS — Z792 Long term (current) use of antibiotics: Secondary | ICD-10-CM | POA: Insufficient documentation

## 2014-12-27 DIAGNOSIS — R51 Headache: Secondary | ICD-10-CM | POA: Insufficient documentation

## 2014-12-27 DIAGNOSIS — Z8639 Personal history of other endocrine, nutritional and metabolic disease: Secondary | ICD-10-CM | POA: Insufficient documentation

## 2014-12-27 DIAGNOSIS — N183 Chronic kidney disease, stage 3 (moderate): Secondary | ICD-10-CM | POA: Insufficient documentation

## 2014-12-27 DIAGNOSIS — Z8739 Personal history of other diseases of the musculoskeletal system and connective tissue: Secondary | ICD-10-CM | POA: Insufficient documentation

## 2014-12-27 DIAGNOSIS — R531 Weakness: Secondary | ICD-10-CM | POA: Insufficient documentation

## 2014-12-27 DIAGNOSIS — R3129 Other microscopic hematuria: Secondary | ICD-10-CM | POA: Insufficient documentation

## 2014-12-27 DIAGNOSIS — R509 Fever, unspecified: Secondary | ICD-10-CM | POA: Insufficient documentation

## 2014-12-27 DIAGNOSIS — Z79899 Other long term (current) drug therapy: Secondary | ICD-10-CM | POA: Insufficient documentation

## 2014-12-27 DIAGNOSIS — Z8579 Personal history of other malignant neoplasms of lymphoid, hematopoietic and related tissues: Secondary | ICD-10-CM | POA: Insufficient documentation

## 2014-12-27 DIAGNOSIS — R1031 Right lower quadrant pain: Secondary | ICD-10-CM | POA: Insufficient documentation

## 2014-12-27 LAB — URINE CULTURE

## 2014-12-27 NOTE — ED Notes (Addendum)
Per family (pt spanish speaking) pt was seen yesterday at the cancer center had labs and urine tested and was told to come back to ED for ?infection and abd pain, pt currently on oral chemo

## 2014-12-28 ENCOUNTER — Emergency Department (HOSPITAL_COMMUNITY): Payer: Self-pay

## 2014-12-28 ENCOUNTER — Encounter: Payer: Self-pay | Admitting: Nurse Practitioner

## 2014-12-28 ENCOUNTER — Encounter (HOSPITAL_COMMUNITY): Payer: Self-pay | Admitting: Radiology

## 2014-12-28 DIAGNOSIS — N419 Inflammatory disease of prostate, unspecified: Secondary | ICD-10-CM | POA: Insufficient documentation

## 2014-12-28 LAB — CBC WITH DIFFERENTIAL/PLATELET
BASOS ABS: 0 10*3/uL (ref 0.0–0.1)
BASOS PCT: 0 %
EOS ABS: 0.1 10*3/uL (ref 0.0–0.7)
EOS PCT: 1 %
HCT: 30.6 % — ABNORMAL LOW (ref 39.0–52.0)
Hemoglobin: 10.1 g/dL — ABNORMAL LOW (ref 13.0–17.0)
Lymphocytes Relative: 26 %
Lymphs Abs: 2.2 10*3/uL (ref 0.7–4.0)
MCH: 32.3 pg (ref 26.0–34.0)
MCHC: 33 g/dL (ref 30.0–36.0)
MCV: 97.8 fL (ref 78.0–100.0)
MONO ABS: 1.2 10*3/uL — AB (ref 0.1–1.0)
Monocytes Relative: 14 %
Neutro Abs: 5 10*3/uL (ref 1.7–7.7)
Neutrophils Relative %: 59 %
PLATELETS: 372 10*3/uL (ref 150–400)
RBC: 3.13 MIL/uL — ABNORMAL LOW (ref 4.22–5.81)
RDW: 14.4 % (ref 11.5–15.5)
WBC: 8.5 10*3/uL (ref 4.0–10.5)

## 2014-12-28 LAB — URINALYSIS, ROUTINE W REFLEX MICROSCOPIC
BILIRUBIN URINE: NEGATIVE
GLUCOSE, UA: NEGATIVE mg/dL
Ketones, ur: NEGATIVE mg/dL
Leukocytes, UA: NEGATIVE
Nitrite: NEGATIVE
Protein, ur: 30 mg/dL — AB
SPECIFIC GRAVITY, URINE: 1.01 (ref 1.005–1.030)
pH: 6 (ref 5.0–8.0)

## 2014-12-28 LAB — COMPREHENSIVE METABOLIC PANEL
ALBUMIN: 2.5 g/dL — AB (ref 3.5–5.0)
ALT: 25 U/L (ref 17–63)
AST: 20 U/L (ref 15–41)
Alkaline Phosphatase: 87 U/L (ref 38–126)
Anion gap: 7 (ref 5–15)
BUN: 11 mg/dL (ref 6–20)
CHLORIDE: 105 mmol/L (ref 101–111)
CO2: 25 mmol/L (ref 22–32)
Calcium: 8.7 mg/dL — ABNORMAL LOW (ref 8.9–10.3)
Creatinine, Ser: 1.23 mg/dL (ref 0.61–1.24)
GFR calc Af Amer: >60 mL/min (ref >60)
Glucose, Bld: 114 mg/dL — ABNORMAL HIGH (ref 65–99)
POTASSIUM: 3.9 mmol/L (ref 3.5–5.1)
SODIUM: 137 mmol/L (ref 135–145)
TOTAL PROTEIN: 7 g/dL (ref 6.5–8.1)
Total Bilirubin: 0.3 mg/dL (ref 0.3–1.2)

## 2014-12-28 LAB — PROTIME-INR
INR: 1.21 (ref 0.00–1.49)
Prothrombin Time: 15.5 seconds — ABNORMAL HIGH (ref 11.6–15.2)

## 2014-12-28 LAB — URINE MICROSCOPIC-ADD ON
BACTERIA UA: NONE SEEN
SQUAMOUS EPITHELIAL / LPF: NONE SEEN

## 2014-12-28 LAB — CK: CK TOTAL: 86 U/L (ref 49–397)

## 2014-12-28 MED ORDER — SODIUM CHLORIDE 0.9 % IV BOLUS (SEPSIS)
1000.0000 mL | Freq: Once | INTRAVENOUS | Status: AC
  Administered 2014-12-28: 1000 mL via INTRAVENOUS

## 2014-12-28 MED ORDER — IOHEXOL 300 MG/ML  SOLN
100.0000 mL | Freq: Once | INTRAMUSCULAR | Status: AC | PRN
  Administered 2014-12-28: 100 mL via INTRAVENOUS

## 2014-12-28 NOTE — ED Notes (Signed)
Wife at bedside, states they were called last night and told to come to ED for possible infection but they aren't really sure,  Pt has been urinating blood and has abdominal pain and fever with chills

## 2014-12-28 NOTE — Assessment & Plan Note (Signed)
Patient reports a 10 day history of a lesion to his right upper eyelid.  He complains of slight discomfort to the site; but no burning or stinging.  He also denies any vision changes whatsoever.  He denies any recent fevers or chills.  On exam it appears that pt has a right upper eyelid hordeolum.    Patient was advised to use warm, moist compresses to the site between 3 and 6 times per day.  He was also given a prescription for erythromycin ointment to apply to the site.  He was advised not to let the tip of the ointment tube touch.  The site; and to also wash his hands well following each time he touches his eyes.  Patient was also advised to follow-up with ophthalmologist if the site does not improve.  Engineer, civil (consulting) were also reviewed with the patient and sent home with him.  Patient speaks only moderate English; but stated full understanding of all instructions today.  Also, patient confirmed that his son at home can review all instructions with patient as well.  Patient was advised to call/return or directly to the emergency department for any worsening symptoms whatsoever. _________________________________________________________ Update:  Patient with right eye hordeolum slowly resolving.  There is much less erythema and edema to the site now.  Patient was advised to continue with warm, moist compresses to the site.  As directed.  He was also encouraged to follow-up with the ophthalmologist if symptoms persist or worsen.

## 2014-12-28 NOTE — Discharge Instructions (Signed)
We saw you in the ER for the weakness, bloody urine, sweats. All the results in the ER are normal, labs and imaging. We are not sure what is causing your symptoms. WE THINK YOU NEED TO SEE YOUR PRIMARY DOCTOR AND YOUR HEMATOLOGY DOCTOR. The workup in the ER is not complete, and is limited to screening for life threatening and emergent conditions only, so please see a primary care doctor for further evaluation.   Debilidad  (Weakness)  La debilidad es la falta de energa. Se puede sentir en todo el cuerpo (generalizada) o en una parte especfica del cuerpo (focalizada).Charles [name] causas de la debilidad pueden ser graves. Es posible que necesite una evaluacin mdica ms profunda, especialmente si usted es anciano o tiene historia de inmunosupresin (como quimioterapia o VIH), enfermedad renal, enfermedad cardiaca o diabetes. CAUSAS  La debilidad puede deberse a muchas afecciones diferentes:   Infecciones.  Cansancio fsico extremo.  Hemorragia interna u otras prdidas de sangre que causa un dficit de glbulos rojos (anemia).  Deshidratacin. Esta causa es ms frecuente en personas mayores.  Efectos secundarios o anormalidades electrolticas por medicamentos, como analgsicos o sedantes.  Estrs emocional, ansiedad o depresin.  Problemas de circulacin, especialmente enfermedad arterial perifrica grave.  Enfermedades cardacas, como la fibrilacin auricular rpida, la bradicardia o la insuficiencia cardaca.  Los trastornos del sistema nervioso, como el sndrome de [place], esclerosis mltiple o ictus. DIAGNSTICO  Para hallar la causa de la debilidad, el mdico le har una historia clnica y un examen fsico. Si es necesario, le indicarn pruebas de laboratorio o Manufacturing engineer.  TRATAMIENTO  El tratamiento de la debilidad depende de la causa de sus sntomas y puede Producer, television/film/video.  INSTRUCCIONES PARA EL CUIDADO EN EL HOGAR   Descanse todo lo que sea necesario.  Consuma una  dieta bien balanceada.  Trate de Engineer, site [employer].  Tome slo medicamentos de venta libre o recetados, segn las indicaciones del mdico. SOLICITE ATENCIN MDICA SI:   La debilidad parece empeorar o se extiende a otras partes del cuerpo.  Siente nuevos dolores o dolencias. SOLICITE ATENCIN MDICA DE INMEDIATO SI:   No puede realizar sus actividades normales diarias, como vestirse y alimentarse.  No puede subir y Medical illustrator o se siente agotado cuando lo hace.  Tiene dificultad para respirar o le duele pecho.  Tiene dificultad para mover partes del cuerpo.  Siente debilidad en una sola zona del cuerpo o solo en un lado del cuerpo.  Tiene fiebre.  Tiene problemas para hablar o tragar.  No puede controlar la vejiga o los movimientos intestinales.  La materia fecal o el vmito son negros o Engineer, building services. ASEGRESE DE QUE:   Comprende estas instrucciones.  Controlar su enfermedad.  Solicitar ayuda de inmediato si no mejora o si empeora.   Esta informacin no tiene Marine scientist el consejo del mdico. Asegrese de hacerle al mdico cualquier pregunta que tenga.   Document Released: 02/28/2006 Document Revised: 07/19/2011 Elsevier Interactive Patient Education 2016 [place] (Fatigue) La fatiga es una sensacin de cansancio en todo momento, falta de energa o falta de motivacin. La fatiga ocasional o leve con frecuencia es una reaccin normal a la actividad o la vida en general. Sin embargo, la fatiga de Engineer, site duracin (crnica) o extrema puede indicar una enfermedad preexistente. INSTRUCCIONES PARA EL CUIDADO EN EL HOGAR  Controle su fatiga para ver si hay cambios. Las siguientes indicaciones ayudarn a [employer] pueda sentir:  Hable con  el mdico acerca de cunto debe dormir cada noche. Trate de dormir la cantidad de tiempo requerida todas las noches.  Tome los medicamentos solamente como se lo haya indicado  el mdico.  Siga una dieta saludable y nutritiva. Pida ayuda al mdico si necesita hacer cambios en su dieta.  Beba suficiente lquido para Consulting civil engineer orina clara o de color amarillo plido.  Practique actividades que lo relajen, como yoga, meditacin, terapia de [place] o acupuntura.  Haga ejercicios regularmente.  Cambie las situaciones que le provocan estrs. Trate de que su [place] y personal sea moderada.  No consuma drogas.  Limite el consumo de alcohol a no ms de 1 medida por da si es mujer y no est Music therapist, y 2 medidas si es hombre. Una medida equivale a 12onzas de cerveza, 5onzas de vino o 1onzas de bebidas alcohlicas de alta graduacin.  Tome una multivitamina, si se lo indic el mdico. SOLICITE ATENCIN MDICA SI:   La fatiga no mejora.  Tiene fiebre.  Tiene prdida o aumento involuntario de [place].  Tiene dolores de [place].  Tiene dificultad para:  Dormirse.  Dormir durante toda la noche.  Se siente enojado, culpable, ansioso o triste.  No puede defecar (estreimiento).  Tiene la piel seca.  Tiene hinchadas las piernas u otra parte del cuerpo. SOLICITE ATENCIN MDICA DE INMEDIATO SI:   Se siente confundido.  Tiene visin borrosa.  Sufre mareos o se desmaya.  Sufre un dolor intenso de [place].  Siente dolor intenso en el abdomen, la pelvis o la espalda.  Tiene dolor de pecho, dificultad para respirar, o latidos cardacos irregulares o acelerados.  No puede orinar u [employer] de lo normal.  Presenta sangrado anormal, como sangrado del recto, la vagina, la nariz, los pulmones o los pezones.  Vomita sangre.  Tiene pensamientos acerca de hacerse dao a s mismo o cometer suicidio.  Le preocupa la posibilidad de hacerle dao a otra persona.   Esta informacin no tiene Marine scientist el consejo del mdico. Asegrese de hacerle al mdico cualquier pregunta que tenga.   Document Released: 05/05/2008 Document Revised:  02/07/2014 Elsevier Interactive Patient Education 2016 [employer]. Hematuria - Adultos (Hematuria, Adult) La hematuria es la presencia de sangre en la orina. La causa puede ser una infeccin en la vejiga, en los riones, en la prstata, clculos renales o cncer de las vas urinarias. Normalmente las infecciones pueden tratarse con medicamentos, y los clculos renales, por lo general, se eliminarn a travs de la orina. Si ninguna de estas es la causa de su hematuria, quizs se necesiten ms estudios para Education officer, community. Es muy importante que le informe a su mdico si ve sangre en la [place], aunque el sangrado se detenga sin tratamiento o no cause dolor. La sangre que aparece en la orina y luego se detiene y vuelve a aparecer nuevamente puede ser un sntoma de una enfermedad muy grave. Adems, el dolor no es un sntoma en las etapas iniciales de muchos tipos de cncer urinarios. INSTRUCCIONES PARA EL CUIDADO EN EL HOGAR   Beba mucho lquido, entre 3 a Software engineer. Si le han diagnosticado una infeccin, se recomienda especialmente el jugo de arndanos rojos, adems de grandes cantidades de [place].  Evite la cafena, el t y las bebidas con gas porque suelen irritar la vejiga.  Evite el alcohol ya que puede Engineer, manufacturing.  Tome todos los [employer] se lo haya indicado el mdico.  Si le recetaron antibiticos,  asegrese de terminarlos, incluso si comienza a sentirse mejor.  Si le han diagnosticado clculos renales, siga las instrucciones de su mdico con respecto a Astronomer la orina para Research scientist (medical) clculo.  Vace la vejiga con frecuencia. Evite retener la orina durante largos perodos.  Despus de defecar, las mujeres deben higienizarse desde adelante hacia atrs. Use solo un papel por vez.  Si es [employer], vace la vejiga antes y despus de Clinical biochemist. SOLICITE ATENCIN MDICA SI:  Siente dolor en la espalda.  Tiene fiebre.  Tiene sensacin de  Higher education careers adviser (nuseas) o vmitos.  Los sntomas no mejoran despus de 3 das. Si la condicin empeora, visite al mdico antes de lo previsto. SOLICITE ATENCIN MDICA DE INMEDIATO SI:   Vomita con frecuencia e intensamente y no puede tolerar los medicamentos.  Siente un dolor intenso en la espalda o el abdomen incluso tomando los medicamentos.  Elimina cogulos grandes o sangre con la [place].  Se siente extremadamente dbil, se desmaya o pierde el conocimiento. ASEGRESE DE QUE:   Comprende estas instrucciones.  Controlar su afeccin.  Recibir ayuda de inmediato si no mejora o si empeora.   Esta informacin no tiene Marine scientist el consejo del mdico. Asegrese de hacerle al mdico cualquier pregunta que tenga.   Document Released: 01/17/2005 Document Revised: 02/07/2014 Elsevier Interactive Patient Education [employer].

## 2014-12-28 NOTE — Assessment & Plan Note (Signed)
Patient presented to the [facility] today with increased fatigue.  He denies any recent fever; but does admit to being chilled recently.  He has also noted some night sweats recently as well.  He has had decreased appetite and lost several pounds within the past 5 days.  He is also complaining of some nausea and occasional vomiting as well.  He denies any diarrhea or constipation.  He does complain of some very mild dysuria; but no frequency or flank pain.  Urinalysis obtained today reveal a large amount of blood, nitrate negative, leukocyte esterase trace, too numerous to count RBCs, 7-10 WBCs, and a few bacteria.  Urine culture results are pending.  Patient's CT obtained on 02/03/2014 revealed a 9 mm cortical lesion of the left kidney, which may be a hemorrhagic cyst or a complex cystic lesion.  Reviewed all findings with on call physician, Dr. Marin Olp- and was advised to prescribe Augmentin antibiotics for treatment of probable urinary tract infection.  Patient was advised to call/return or if go directly to the emergency department for any worsening symptoms whatsoever.

## 2014-12-28 NOTE — ED Notes (Signed)
Patient transported to CT 

## 2014-12-28 NOTE — ED Provider Notes (Addendum)
CSN: [id number]     Arrival date & time 12/27/14  2138 History  By signing my name below, I, Irene Pap, attest that this documentation has been prepared under the direction and in the presence of Varney Biles, MD. Electronically Signed: Irene Pap, ED Scribe. 12/28/2014. 3:57 AM.   Chief Complaint  Patient presents with  . Abdominal Pain  . Hematuria  . Fever   The history is provided by the spouse. The history is limited by a language barrier.  HPI Comments: Jenny Zacarias Pontes is a 39 y.o. male with a hx of CKD and IgG myeloma who presents to the Emergency Department complaining of chills, diaphoresis at night, fever and generalized myalgias onset 5 days ago. Wife states that the pt will wake up with his shirt drenched in sweat. She reports associated headache that he rates 7/10, 6 lb weight loss in 5 days, dark brown urine with hematuria, mild dysuria, frequency, and right sided abdominal pain that he rates 9/10. She states that he was seen at the cancer center yesterday and was told he had a urine infection. Wife denies sick contacts. She denies chest pain, neck pain, back pain, SOB, cough, or rash. Wife denies hx of drug use, TB exposure or incarceration. Pt works as a Theme park manager, but has not been to work since Tuesday due to his symptoms. Pt speaks Spanish only; translated by wife. Per triage note, pt is currently on oral chemo.   Past Medical History  Diagnosis Date  . IgG myeloma ([place])   . CKD (chronic kidney disease), stage III   . Hypercholesterolemia   . Lytic bone lesions on xray    History reviewed. No pertinent past surgical history. No family history on file. Social History  Substance Use Topics  . Smoking status: Never Smoker   . Smokeless tobacco: Never Used  . Alcohol Use: No    Review of Systems  10 Systems reviewed and all are negative for acute change except as noted in the HPI.  Allergies  Review of patient's allergies indicates no known  allergies.  Home Medications   Prior to Admission medications   Medication Sig Start Date End Date Taking? Authorizing Provider  acyclovir (ZOVIRAX) 400 MG tablet Take 1 tablet (400 mg total) by mouth 2 (two) times daily. 05/01/14  Yes [name], MD  amoxicillin-clavulanate (AUGMENTIN) 875-125 MG tablet Take 1 tablet by mouth 2 (two) times daily. 12/26/14  Yes [name], NP  lenalidomide (REVLIMID) 10 MG capsule Take 1 capsule (10 mg total) by mouth daily. 12/09/14  Yes [name], MD  ondansetron (ZOFRAN) 8 MG tablet Take 1 tablet (8 mg total) by mouth every 8 (eight) hours as needed for nausea or vomiting. 12/26/14  Yes [name], NP  erythromycin ophthalmic ointment Place 1 application into the right eye 4 (four) times daily. Patient not taking: Reported on 12/28/2014 12/18/14   [name], NP  fluconazole (DIFLUCAN) 100 MG tablet Take 1 tablet (100 mg total) by mouth daily. Patient not taking: Reported on 12/28/2014 10/15/14   [name], MD  sulfamethoxazole-trimethoprim (BACTRIM DS,SEPTRA DS) 800-160 MG per tablet Take 1 tablet by mouth 2 (two) times daily. Patient not taking: Reported on 12/28/2014 10/15/14   [name], MD   BP 103/68 mmHg  Pulse 82  Temp(Src) 97.6 F (36.4 C) (Oral)  Resp 19  Ht '5\' 3"'$  (1.6 m)  Wt 156 lb (70.761 kg)  BMI 27.64 kg/m2  SpO2 100% Physical  Exam  Constitutional: He appears well-developed and well-nourished. No distress.  HENT:  Head: Normocephalic and atraumatic.  Mouth/Throat: Oropharynx is clear and moist. No oropharyngeal exudate.  Eyes: Conjunctivae and EOM are normal. Pupils are equal, round, and reactive to light. Right eye exhibits no discharge. Left eye exhibits no discharge. No scleral icterus.  Neck: Normal range of motion. Neck supple. No JVD present. No thyromegaly present.  Cardiovascular: Normal rate, regular rhythm, normal heart sounds and intact distal pulses.  Exam reveals no gallop and no  friction rub.   No murmur heard. Pulmonary/Chest: Effort normal and breath sounds normal. No respiratory distress. He has no wheezes. He has no rales.  Lungs clear to auscultation  Abdominal: Soft. Bowel sounds are normal. He exhibits no distension and no mass. There is tenderness in the right lower quadrant. There is no rebound and no guarding.  Right flank tenderness  Musculoskeletal: Normal range of motion. He exhibits no edema or tenderness.  Lymphadenopathy:    He has no cervical adenopathy.  Neurological: He is alert. Coordination normal.  Skin: Skin is warm and dry. No rash noted. No erythema.  Psychiatric: He has a normal mood and affect. His behavior is normal.  Nursing note and vitals reviewed.   ED Course  Procedures (including critical care time) DIAGNOSTIC STUDIES: Oxygen Saturation is 99% on RA, normal by my interpretation.    COORDINATION OF CARE: 1:39 AM-Discussed treatment plan which includes chest x-ray, labs with pt at bedside and pt agreed to plan.    Labs Review Labs Reviewed  CBC WITH DIFFERENTIAL/PLATELET - Abnormal; Notable for the following:    RBC 3.13 (*)    Hemoglobin 10.1 (*)    HCT 30.6 (*)    Monocytes Absolute 1.2 (*)    All other components within normal limits  COMPREHENSIVE METABOLIC PANEL - Abnormal; Notable for the following:    Glucose, Bld 114 (*)    Calcium 8.7 (*)    Albumin 2.5 (*)    All other components within normal limits  PROTIME-INR - Abnormal; Notable for the following:    Prothrombin Time 15.5 (*)    All other components within normal limits  URINALYSIS, ROUTINE W REFLEX MICROSCOPIC (NOT AT [facility]) - Abnormal; Notable for the following:    APPearance CLOUDY (*)    Hgb urine dipstick LARGE (*)    Protein, ur 30 (*)    All other components within normal limits  URINE CULTURE  URINE MICROSCOPIC-ADD ON  CK    Imaging Review Dg Chest 2 View  12/28/2014  CLINICAL DATA:  Fever, night sweats, and chills.  Evaluate for TB.  EXAM: CHEST  2 VIEW COMPARISON:  CT chest 01/30/2014. FINDINGS: Normal heart size and pulmonary vascularity. No focal airspace disease or consolidation in the lungs. No blunting of costophrenic angles. No pneumothorax. Mediastinal contours appear intact. Focal pleural thickening along the right apex corresponding to an old fracture of the right anterior second rib on previous chest CT. No evidence of infiltration, cavitation, or effusion to suggest TB. IMPRESSION: No evidence of active pulmonary disease. No radiographic evidence to suggest active TB. Electronically Signed   By: Lucienne Capers M.D.   On: 12/28/2014 02:08   Ct Abdomen Pelvis W Contrast  12/28/2014  CLINICAL DATA:  Acute onset of right lower quadrant abdominal pain and hematuria. Fever, nausea, chills and diaphoresis. Initial encounter. EXAM: CT ABDOMEN AND PELVIS WITH CONTRAST TECHNIQUE: Multidetector CT imaging of the abdomen and pelvis was performed using the standard protocol  following bolus administration of intravenous contrast. CONTRAST:  177m OMNIPAQUE IOHEXOL 300 MG/ML  SOLN COMPARISON:  CT of the abdomen and pelvis performed 02/03/2014 FINDINGS: Mild bibasilar atelectasis is noted. The liver and spleen are unremarkable in appearance. The gallbladder is within normal limits. The pancreas and adrenal glands are unremarkable. A 1.2 cm cyst is noted at the interpole region of the left kidney. The kidneys are otherwise unremarkable. There is no evidence of hydronephrosis. No renal or ureteral stones are seen. No perinephric stranding is appreciated. No free fluid is identified. The small bowel is unremarkable in appearance. The stomach is within normal limits. No acute vascular abnormalities are seen. The appendix is normal in caliber, without evidence of appendicitis. Contrast progresses to the level of the rectum. The colon is unremarkable in appearance. The bladder is mildly distended and grossly unremarkable. The prostate remains normal  in size. No inguinal lymphadenopathy is seen. No acute osseous abnormalities are identified. IMPRESSION: 1. No acute abnormality seen in the abdomen or pelvis. 2. Small left renal cyst noted. 3. Mild bibasilar atelectasis noted. Electronically Signed   By: JGarald BaldingM.D.   On: 12/28/2014 07:02   I have personally reviewed and evaluated these images and lab results as part of my medical decision-making.   EKG Interpretation None      MDM   Final diagnoses:  Generalized weakness  Hematuria, microscopic    I personally performed the services described in this documentation, which was scribed in my presence. The recorded information has been reviewed and is accurate.  Pt comes in with cc of weakness. Hx of MM, he is on therapy for it. 0 SIRS at arrival. Labs reassuring. Pt has abd pain - CT done, and neg. Will d/c.  Hemeonc informed of the visit.    AVarney Biles MD 12/28/14 [id number] AVarney Biles MD 12/28/14 [id number]

## 2014-12-28 NOTE — Progress Notes (Signed)
[facility]   HPI: Charles [name] 39 y.o. male diagnosed with multiple myeloma with bone metastasis.  Currently undergoing Revlimid and Zometa therapy. Patient presented to the [facility] today with increased fatigue.  He denies any recent fever; but does admit to being chilled recently.  He has also noted some night sweats recently as well.  He has had decreased appetite and lost several pounds within the past 5 days.  He is also complaining of some nausea and occasional vomiting as well.  He denies any diarrhea or constipation.  He does complain of some very mild dysuria; but no frequency or flank pain.  Urinalysis obtained today reveal a large amount of blood, nitrate negative, leukocyte esterase trace, too numerous to count RBCs, 7-10 WBCs, and a few bacteria.  Urine culture results are pending.  Patient's CT obtained on 02/03/2014 revealed a 9 mm cortical lesion of the left kidney, which may be a hemorrhagic cyst or a complex cystic lesion.  Reviewed all findings with on call physician, Dr. Marin Olp- and was advised to prescribe Augmentin antibiotics for treatment of probable urinary tract infection.  Patient was advised to call/return or if go directly to the emergency department for any worsening symptoms whatsoever.    HPI  ROS  Past Medical History  Diagnosis Date  . IgG myeloma ([place])   . CKD (chronic kidney disease), stage III   . Hypercholesterolemia   . Lytic bone lesions on xray     History reviewed. No pertinent past surgical history.  has Lytic bone lesions on xray; Multiple myeloma ([facility]); CKD (chronic kidney disease) stage 3, GFR 30-59 ml/min; Anemia in neoplastic disease; Hordeolum externum of right eye; and UTI (urinary tract infection) on his problem list.    has No Known Allergies.    Medication List       This list is accurate as of: 12/26/14 11:59 PM.  Always use your most recent med list.               acyclovir 400 MG tablet   Commonly known as:  ZOVIRAX  Take 1 tablet (400 mg total) by mouth 2 (two) times daily.     amoxicillin-clavulanate 875-125 MG tablet  Commonly known as:  AUGMENTIN  Take 1 tablet by mouth 2 (two) times daily.     erythromycin ophthalmic ointment  Place 1 application into the right eye 4 (four) times daily.     fluconazole 100 MG tablet  Commonly known as:  DIFLUCAN  Take 1 tablet (100 mg total) by mouth daily.     lenalidomide 10 MG capsule  Commonly known as:  REVLIMID  Take 1 capsule (10 mg total) by mouth daily.     ondansetron 8 MG tablet  Commonly known as:  ZOFRAN  Take 1 tablet (8 mg total) by mouth every 8 (eight) hours as needed for nausea or vomiting.     sulfamethoxazole-trimethoprim 800-160 MG tablet  Commonly known as:  BACTRIM DS,SEPTRA DS  Take 1 tablet by mouth 2 (two) times daily.         PHYSICAL EXAMINATION  Oncology Vitals 12/28/2014 12/28/2014  Height - -  Weight - -  Weight (lbs) - -  BMI (kg/m2) - -  Temp 98.7 98.4  Pulse 85 72  Resp 15 18  SpO2 99 99  BSA (m2) - -   BP Readings from Last 2 Encounters:  12/28/14 114/69  12/26/14 172/72    Physical Exam  Constitutional: He is oriented  to person, place, and time and well-developed, well-nourished, and in no distress.  HENT:  Head: Normocephalic and atraumatic.  Mouth/Throat: Oropharynx is clear and moist.  Eyes: Conjunctivae and EOM are normal. Pupils are equal, round, and reactive to light. Right eye exhibits no discharge. Left eye exhibits no discharge. No scleral icterus.  Neck: Normal range of motion. Neck supple. No JVD present. No tracheal deviation present. No thyromegaly present.  Cardiovascular: Normal rate, regular rhythm, normal heart sounds and intact distal pulses.   Pulmonary/Chest: Effort normal and breath sounds normal. No respiratory distress. He has no wheezes. He has no rales. He exhibits no tenderness.  Abdominal: Soft. Bowel sounds are normal. He exhibits no  distension and no mass. There is no tenderness. There is no rebound and no guarding.  Musculoskeletal: Normal range of motion. He exhibits no edema or tenderness.  Lymphadenopathy:    He has no cervical adenopathy.  Neurological: He is alert and oriented to person, place, and time. Gait normal.  Skin: Skin is warm and dry. No rash noted. No erythema. No pallor.  Psychiatric: Affect normal.  Nursing note and vitals reviewed.   LABORATORY DATA:. Appointment on 12/26/2014  Component Date Value Ref Range Status  . WBC 12/26/2014 10.5* 4.0 - 10.3 10e3/uL Final  . NEUT# 12/26/2014 7.4* 1.5 - 6.5 10e3/uL Final  . HGB 12/26/2014 11.9* 13.0 - 17.1 g/dL Final  . HCT 12/26/2014 36.2* 38.4 - 49.9 % Final  . Platelets 12/26/2014 379  140 - 400 10e3/uL Final  . MCV 12/26/2014 97.4  79.3 - 98.0 fL Final  . MCH 12/26/2014 32.2  27.2 - 33.4 pg Final  . MCHC 12/26/2014 33.0  32.0 - 36.0 g/dL Final  . RBC 12/26/2014 3.71* 4.20 - 5.82 10e6/uL Final  . RDW 12/26/2014 15.2* 11.0 - 14.6 % Final  . lymph# 12/26/2014 1.8  0.9 - 3.3 10e3/uL Final  . MONO# 12/26/2014 1.1* 0.1 - 0.9 10e3/uL Final  . Eosinophils Absolute 12/26/2014 0.2  0.0 - 0.5 10e3/uL Final  . Basophils Absolute 12/26/2014 0.0  0.0 - 0.1 10e3/uL Final  . NEUT% 12/26/2014 70.2  39.0 - 75.0 % Final  . LYMPH% 12/26/2014 17.4  14.0 - 49.0 % Final  . MONO% 12/26/2014 10.6  0.0 - 14.0 % Final  . EOS% 12/26/2014 1.4  0.0 - 7.0 % Final  . BASO% 12/26/2014 0.4  0.0 - 2.0 % Final  . Sodium 12/26/2014 138  136 - 145 mEq/L Final  . Potassium 12/26/2014 3.9  3.5 - 5.1 mEq/L Final  . Chloride 12/26/2014 104  98 - 109 mEq/L Final  . CO2 12/26/2014 26  22 - 29 mEq/L Final  . Glucose 12/26/2014 80  70 - 140 mg/dl Final   Glucose reference range is for nonfasting patients. Fasting glucose reference range is 70- 100.  Marland Kitchen BUN 12/26/2014 12.7  7.0 - 26.0 mg/dL Final  . Creatinine 12/26/2014 1.1  0.7 - 1.3 mg/dL Final  . Total Bilirubin 12/26/2014 <0.30   0.20 - 1.20 mg/dL Final  . Alkaline Phosphatase 12/26/2014 98  40 - 150 U/L Final  . AST 12/26/2014 19  5 - 34 U/L Final  . ALT 12/26/2014 24  0 - 55 U/L Final  . Total Protein 12/26/2014 7.4  6.4 - 8.3 g/dL Final  . Albumin 12/26/2014 2.4* 3.5 - 5.0 g/dL Final  . Calcium 12/26/2014 9.5  8.4 - 10.4 mg/dL Final  . Anion Gap 12/26/2014 8  3 - 11 mEq/L Final  .  EGFR 12/26/2014 87* >90 ml/min/1.73 m2 Final   eGFR is calculated using the CKD-EPI Creatinine Equation (2009)  . Glucose 12/26/2014 Negative  Negative mg/dL Final  . Bilirubin (Urine) 12/26/2014 Negative  Negative Final  . Ketones 12/26/2014 Negative  Negative mg/dL Final  . Specific Gravity, Urine 12/26/2014 1.015  1.003 - 1.035 Final  . Blood 12/26/2014 Large  Negative Final  . pH 12/26/2014 6.0  4.6 - 8.0 Final  . Protein 12/26/2014 100  Negative- <30 mg/dL Final  . Urobilinogen, UR 12/26/2014 0.2  0.2 - 1 mg/dL Final  . Nitrite 12/26/2014 Negative  Negative Final  . Leukocyte Esterase 12/26/2014 Trace  Negative Final  . RBC / HPF 12/26/2014 TNTC  0 - 2 Final  . WBC, UA 12/26/2014 7-10  0 - 2 Final  . Bacteria, UA 12/26/2014 Few  Negative- Trace Final  . Epithelial Cells 12/26/2014 Few  Negative- Few Final  . Urine Culture, Routine 12/26/2014 Culture, Urine   Final   Comment: Final - ===== COLONY COUNT: ===== NO GROWTH NO GROWTH      RADIOGRAPHIC STUDIES: Dg Chest 2 View  12/28/2014  CLINICAL DATA:  Fever, night sweats, and chills.  Evaluate for TB. EXAM: CHEST  2 VIEW COMPARISON:  CT chest 01/30/2014. FINDINGS: Normal heart size and pulmonary vascularity. No focal airspace disease or consolidation in the lungs. No blunting of costophrenic angles. No pneumothorax. Mediastinal contours appear intact. Focal pleural thickening along the right apex corresponding to an old fracture of the right anterior second rib on previous chest CT. No evidence of infiltration, cavitation, or effusion to suggest TB. IMPRESSION: No evidence of  active pulmonary disease. No radiographic evidence to suggest active TB. Electronically Signed   By: Lucienne Capers M.D.   On: 12/28/2014 02:08   Ct Abdomen Pelvis W Contrast  12/28/2014  CLINICAL DATA:  Acute onset of right lower quadrant abdominal pain and hematuria. Fever, nausea, chills and diaphoresis. Initial encounter. EXAM: CT ABDOMEN AND PELVIS WITH CONTRAST TECHNIQUE: Multidetector CT imaging of the abdomen and pelvis was performed using the standard protocol following bolus administration of intravenous contrast. CONTRAST:  112m OMNIPAQUE IOHEXOL 300 MG/ML  SOLN COMPARISON:  CT of the abdomen and pelvis performed 02/03/2014 FINDINGS: Mild bibasilar atelectasis is noted. The liver and spleen are unremarkable in appearance. The gallbladder is within normal limits. The pancreas and adrenal glands are unremarkable. A 1.2 cm cyst is noted at the interpole region of the left kidney. The kidneys are otherwise unremarkable. There is no evidence of hydronephrosis. No renal or ureteral stones are seen. No perinephric stranding is appreciated. No free fluid is identified. The small bowel is unremarkable in appearance. The stomach is within normal limits. No acute vascular abnormalities are seen. The appendix is normal in caliber, without evidence of appendicitis. Contrast progresses to the level of the rectum. The colon is unremarkable in appearance. The bladder is mildly distended and grossly unremarkable. The prostate remains normal in size. No inguinal lymphadenopathy is seen. No acute osseous abnormalities are identified. IMPRESSION: 1. No acute abnormality seen in the abdomen or pelvis. 2. Small left renal cyst noted. 3. Mild bibasilar atelectasis noted. Electronically Signed   By: JGarald BaldingM.D.   On: 12/28/2014 07:02    ASSESSMENT/PLAN:    [place] continues to take his Revlimid oral therapy as directed.  He last received Zometa infusion on 11/14/2014.  Patient is  scheduled to return 02/05/2015 prolapse, visit, and his next Zometa infusion.  Hordeolum externum of right eye Patient reports a 10 day history of a lesion to his right upper eyelid.  He complains of slight discomfort to the site; but no burning or stinging.  He also denies any vision changes whatsoever.  He denies any recent fevers or chills.  On exam it appears that pt has a right upper eyelid hordeolum.    Patient was advised to use warm, moist compresses to the site between 3 and 6 times per day.  He was also given a prescription for erythromycin ointment to apply to the site.  He was advised not to let the tip of the ointment tube touch.  The site; and to also wash his hands well following each time he touches his eyes.  Patient was also advised to follow-up with ophthalmologist if the site does not improve.  Engineer, civil (consulting) were also reviewed with the patient and sent home with him.  Patient speaks only moderate English; but stated full understanding of all instructions today.  Also, patient confirmed that his son at home can review all instructions with patient as well.  Patient was advised to call/return or directly to the emergency department for any worsening symptoms whatsoever. _________________________________________________________ Update:  Patient with right eye hordeolum slowly resolving.  There is much less erythema and edema to the site now.  Patient was advised to continue with warm, moist compresses to the site.  As directed.  He was also encouraged to follow-up with the ophthalmologist if symptoms persist or worsen.  UTI (urinary tract infection) Patient presented to the [facility] today with increased fatigue.  He denies any recent fever; but does admit to being chilled recently.  He has also noted some night sweats recently as well.  He has had decreased appetite and lost several pounds within the past 5 days.  He is also complaining of some nausea  and occasional vomiting as well.  He denies any diarrhea or constipation.  He does complain of some very mild dysuria; but no frequency or flank pain.  Urinalysis obtained today reveal a large amount of blood, nitrate negative, leukocyte esterase trace, too numerous to count RBCs, 7-10 WBCs, and a few bacteria.  Urine culture results are pending.  Patient's CT obtained on 02/03/2014 revealed a 9 mm cortical lesion of the left kidney, which may be a hemorrhagic cyst or a complex cystic lesion.  Reviewed all findings with on call physician, Dr. Marin Olp- and was advised to prescribe Augmentin antibiotics for treatment of probable urinary tract infection.  Patient was advised to call/return or if go directly to the emergency department for any worsening symptoms whatsoever.  Patient stated understanding of all instructions; and was in agreement with this plan of care. The patient knows to call the clinic with any problems, questions or concerns.   Review/collaboration with Dr. Jana Hakim regarding all aspects of patient's visit today.   Total time spent with patient was 25 minutes;  with greater than 75 percent of that time spent in face to face counseling regarding patient's symptoms,  and coordination of care and follow up.  Disclaimer:This dictation was prepared with Dragon/digital dictation along with [employer]. Any transcriptional errors that result from this process are unintentional.  Drue Second, NP 12/28/2014

## 2014-12-28 NOTE — Assessment & Plan Note (Signed)
Patient continues to take his Revlimid oral therapy as directed.  He last received Zometa infusion on 11/14/2014.  Patient is scheduled to return 02/05/2015 prolapse, visit, and his next Zometa infusion.

## 2014-12-28 NOTE — ED Notes (Signed)
Pt given discharge instruction in [place], utilized translator service and pt verbalizes understanding of all instructions, including to follow up with a primary doctor and specialist as soon as possiable.  Removed IV and took discharge vitals all within normal baseline limits for d/c.

## 2014-12-29 ENCOUNTER — Other Ambulatory Visit: Payer: Self-pay

## 2014-12-29 ENCOUNTER — Ambulatory Visit: Payer: Self-pay

## 2014-12-29 ENCOUNTER — Ambulatory Visit (HOSPITAL_BASED_OUTPATIENT_CLINIC_OR_DEPARTMENT_OTHER): Payer: Self-pay | Admitting: Nurse Practitioner

## 2014-12-29 ENCOUNTER — Other Ambulatory Visit: Payer: Self-pay | Admitting: Oncology

## 2014-12-29 DIAGNOSIS — R509 Fever, unspecified: Secondary | ICD-10-CM

## 2014-12-29 DIAGNOSIS — R5383 Other fatigue: Secondary | ICD-10-CM

## 2014-12-29 DIAGNOSIS — E8809 Other disorders of plasma-protein metabolism, not elsewhere classified: Secondary | ICD-10-CM

## 2014-12-29 DIAGNOSIS — N390 Urinary tract infection, site not specified: Secondary | ICD-10-CM

## 2014-12-29 DIAGNOSIS — B958 Unspecified staphylococcus as the cause of diseases classified elsewhere: Secondary | ICD-10-CM

## 2014-12-29 DIAGNOSIS — H00013 Hordeolum externum right eye, unspecified eyelid: Secondary | ICD-10-CM

## 2014-12-29 DIAGNOSIS — T83511S Infection and inflammatory reaction due to indwelling urethral catheter, sequela: Secondary | ICD-10-CM

## 2014-12-29 DIAGNOSIS — E46 Unspecified protein-calorie malnutrition: Secondary | ICD-10-CM

## 2014-12-29 DIAGNOSIS — R112 Nausea with vomiting, unspecified: Secondary | ICD-10-CM

## 2014-12-29 DIAGNOSIS — C9000 Multiple myeloma not having achieved remission: Secondary | ICD-10-CM

## 2014-12-29 LAB — URINALYSIS, MICROSCOPIC - CHCC
BILIRUBIN (URINE): NEGATIVE
Glucose: NEGATIVE mg/dL
KETONES: NEGATIVE mg/dL
Nitrite: NEGATIVE
PH: 6.5 (ref 4.6–8.0)
Protein: 100 mg/dL
SPECIFIC GRAVITY, URINE: 1.015 (ref 1.003–1.035)
Urobilinogen, UR: 0.2 mg/dL (ref 0.2–1)

## 2014-12-29 LAB — CBC WITH DIFFERENTIAL/PLATELET
BASO%: 0.2 % (ref 0.0–2.0)
Basophils Absolute: 0 10*3/uL (ref 0.0–0.1)
EOS ABS: 0.2 10*3/uL (ref 0.0–0.5)
EOS%: 2 % (ref 0.0–7.0)
HEMATOCRIT: 33.5 % — AB (ref 38.4–49.9)
HGB: 11.2 g/dL — ABNORMAL LOW (ref 13.0–17.1)
LYMPH%: 19.2 % (ref 14.0–49.0)
MCH: 32.3 pg (ref 27.2–33.4)
MCHC: 33.4 g/dL (ref 32.0–36.0)
MCV: 96.5 fL (ref 79.3–98.0)
MONO#: 0.8 10*3/uL (ref 0.1–0.9)
MONO%: 9.9 % (ref 0.0–14.0)
NEUT#: 5.7 10*3/uL (ref 1.5–6.5)
NEUT%: 68.7 % (ref 39.0–75.0)
PLATELETS: 410 10*3/uL — AB (ref 140–400)
RBC: 3.47 10*6/uL — ABNORMAL LOW (ref 4.20–5.82)
RDW: 14.3 % (ref 11.0–14.6)
WBC: 8.3 10*3/uL (ref 4.0–10.3)
lymph#: 1.6 10*3/uL (ref 0.9–3.3)

## 2014-12-29 LAB — COMPREHENSIVE METABOLIC PANEL (CC13)
ALT: 21 U/L (ref 0–55)
ANION GAP: 10 meq/L (ref 3–11)
AST: 17 U/L (ref 5–34)
Albumin: 2.2 g/dL — ABNORMAL LOW (ref 3.5–5.0)
Alkaline Phosphatase: 108 U/L (ref 40–150)
BUN: 9.8 mg/dL (ref 7.0–26.0)
CALCIUM: 9.4 mg/dL (ref 8.4–10.4)
CHLORIDE: 104 meq/L (ref 98–109)
CO2: 23 mEq/L (ref 22–29)
CREATININE: 1.2 mg/dL (ref 0.7–1.3)
EGFR: 78 mL/min/{1.73_m2} — AB (ref >90)
Glucose: 111 mg/dl (ref 70–140)
Potassium: 3.5 mEq/L (ref 3.5–5.1)
Sodium: 138 mEq/L (ref 136–145)
TOTAL PROTEIN: 7.7 g/dL (ref 6.4–8.3)
Total Bilirubin: <0.3 mg/dL (ref 0.20–1.20)

## 2014-12-29 LAB — URINE CULTURE: CULTURE: NO GROWTH

## 2014-12-29 LAB — CULTURE, BLOOD (SINGLE)

## 2014-12-29 NOTE — Progress Notes (Signed)
Patient reported with positive Staph through blood cultures.  This issue was addressed with Selena Lesser, NP.  Patient called and wife will bring him in today to see Cyndee with a possible direct admit.

## 2014-12-30 LAB — URINE CULTURE

## 2014-12-31 ENCOUNTER — Encounter: Payer: Self-pay | Admitting: Nurse Practitioner

## 2014-12-31 DIAGNOSIS — E46 Unspecified protein-calorie malnutrition: Secondary | ICD-10-CM | POA: Insufficient documentation

## 2014-12-31 NOTE — Progress Notes (Signed)
SYMPTOM MANAGEMENT CLINIC   HPI: Charles [name] 39 y.o. male diagnosed with multiple myeloma with bone metastasis.  Currently undergoing Revlimid and Zometa therapy.    Patient presented to the [facility] last Friday 12/26/14 with increased fatigue.  He denied any recent fever; but does admit to being chilled recently.  He has also noted some night sweats recently as well.  He has had decreased appetite and lost several pounds within the past 5 days.  He is also complaining of some nausea and occasional vomiting as well.  He denies any diarrhea or constipation.  He does complain of some very mild dysuria; but no frequency or flank pain.  Urinalysis obtained at that time did reveal a mild urinary tract infection; and urine cultures were pending results.  Patient's CT obtained on 02/03/2014 revealed a 9 mm cortical lesion of the left kidney, which may be a hemorrhagic cyst or a complex cystic lesion.  Reviewed all findings with on call physician, Dr. Marin Olp- and was advised to prescribe Augmentin antibiotics for treatment of probable urinary tract infection.  Over this past weekend-urine culture returned with no growth; but blood cultures were obtained that same time revealed coag-negative Staphylococcus in one bottle only.  Patient was called and requested to return to the [facility] for further follow-up/recheck.  Patient states that he continues to feel very fatigued; with mild dysuria.  He continues with chills; and questionable fevers.  Patient obtained labs.  Once again, which revealed a WBC of 8.3, ANC 5.7, hemoglobin 11.2, platelet count 410.  Urinalysis obtained today revealed a large amount of hematuria, nitrate negative, trace leukocyte esterase, too numerous to count RBCs, WBCs 3-6, and a few bacteria.  Urine culture was once again ordered and pending results.  Also, obtained blood cultures 2, peripheral sticks.  Once again, and these results are pending.  Called and  spoke to Dr. Johnnye Sima.  Infectious disease physician regarding the 1 coagulation negative staph.  A culture obtained over the weekend.  He advised we drawing blood cultures and continuing on the Augmentin as previously prescribed.  Patient was advised to continue the Augmentin; and will follow back up with the patient on Thursday morning , 01/01/2015.  Patient was advised to call/return directly to the emergency department for any worsening symptoms whatsoever. Urinary Tract Infection     ROS  Past Medical History  Diagnosis Date  . 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 CKD (chronic kidney disease), stage III   . Hypercholesterolemia   . Lytic bone lesions on xray     History reviewed. No pertinent past surgical history.  has Lytic bone lesions on xray; Multiple myeloma (HCC); CKD (chronic kidney disease) stage 3, GFR 30-59 ml/min; Anemia in neoplastic disease; Hordeolum externum of right eye; UTI (urinary tract infection); and Hypoalbuminemia due to protein-calorie malnutrition ([place]) on his problem list.    has No Known Allergies.    Medication List       This list is accurate as of: 12/29/14 11:59 PM.  Always use your most recent med list.               acyclovir 400 MG tablet  Commonly known as:  ZOVIRAX  Take 1 tablet (400 mg total) by mouth 2 (two) times daily.     amoxicillin-clavulanate 875-125 MG tablet  Commonly known as:  AUGMENTIN  Take 1 tablet by mouth 2 (two) times daily.     erythromycin ophthalmic ointment  Place 1 application into the right  eye 4 (four) times daily.     fluconazole 100 MG tablet  Commonly known as:  DIFLUCAN  Take 1 tablet (100 mg total) by mouth daily.     lenalidomide 10 MG capsule  Commonly known as:  REVLIMID  Take 1 capsule (10 mg total) by mouth daily.     ondansetron 8 MG tablet  Commonly known as:  ZOFRAN  Take 1 tablet (8 mg total) by mouth every 8 (eight) hours as needed for nausea or vomiting.     sulfamethoxazole-trimethoprim  800-160 MG tablet  Commonly known as:  BACTRIM DS,SEPTRA DS  Take 1 tablet by mouth 2 (two) times daily.         PHYSICAL EXAMINATION  Oncology Vitals 12/28/2014 12/28/2014  Height - -  Weight - -  Weight (lbs) - -  BMI (kg/m2) - -  Temp 98.7 98.4  Pulse 85 72  Resp 15 18  SpO2 99 99  BSA (m2) - -   BP Readings from Last 2 Encounters:  12/28/14 114/69  12/26/14 172/72    Physical Exam  Constitutional: He is oriented to person, place, and time and well-developed, well-nourished, and in no distress.  HENT:  Head: Normocephalic and atraumatic.  Mouth/Throat: Oropharynx is clear and moist.  Eyes: Conjunctivae and EOM are normal. Pupils are equal, round, and reactive to light. Right eye exhibits no discharge. Left eye exhibits no discharge. No scleral icterus.  Neck: Normal range of motion. Neck supple. No JVD present. No tracheal deviation present. No thyromegaly present.  Cardiovascular: Normal rate, regular rhythm, normal heart sounds and intact distal pulses.   Pulmonary/Chest: Effort normal and breath sounds normal. No respiratory distress. He has no wheezes. He has no rales. He exhibits no tenderness.  Abdominal: Soft. Bowel sounds are normal. He exhibits no distension and no mass. There is no tenderness. There is no rebound and no guarding.  Musculoskeletal: Normal range of motion. He exhibits no edema or tenderness.  Lymphadenopathy:    He has no cervical adenopathy.  Neurological: He is alert and oriented to person, place, and time. Gait normal.  Skin: Skin is warm and dry. No rash noted. No erythema. No pallor.  Psychiatric: Affect normal.  Nursing note and vitals reviewed.   LABORATORY DATA:. Office Visit on 12/29/2014  Component Date Value Ref Range Status  . Glucose 12/29/2014 Negative  Negative mg/dL Final  . Bilirubin (Urine) 12/29/2014 Negative  Negative Final  . Ketones 12/29/2014 Negative  Negative mg/dL Final  . Specific Gravity, Urine 12/29/2014 1.015   1.003 - 1.035 Final  . Blood 12/29/2014 Large  Negative Final  . pH 12/29/2014 6.5  4.6 - 8.0 Final  . Protein 12/29/2014 100  Negative- <30 mg/dL Final  . Urobilinogen, UR 12/29/2014 0.2  0.2 - 1 mg/dL Final  . Nitrite 12/29/2014 Negative  Negative Final  . Leukocyte Esterase 12/29/2014 Trace  Negative Final  . RBC / HPF 12/29/2014 TNTC  0 - 2 Final  . WBC, UA 12/29/2014 3-6  0 - 2 Final  . Bacteria, UA 12/29/2014 Few  Negative- Trace Final  . Epithelial Cells 12/29/2014 Few  Negative- Few Final  . Urine Culture, Routine 12/29/2014 Culture, Urine   Final   Comment: Final - ===== COLONY COUNT: ===== NO GROWTH NO GROWTH   . WBC 12/29/2014 8.3  4.0 - 10.3 10e3/uL Final  . NEUT# 12/29/2014 5.7  1.5 - 6.5 10e3/uL Final  . HGB 12/29/2014 11.2* 13.0 - 17.1 g/dL Final  . HCT  12/29/2014 33.5* 38.4 - 49.9 % Final  . Platelets 12/29/2014 410* 140 - 400 10e3/uL Final  . MCV 12/29/2014 96.5  79.3 - 98.0 fL Final  . MCH 12/29/2014 32.3  27.2 - 33.4 pg Final  . MCHC 12/29/2014 33.4  32.0 - 36.0 g/dL Final  . RBC 12/29/2014 3.47* 4.20 - 5.82 10e6/uL Final  . RDW 12/29/2014 14.3  11.0 - 14.6 % Final  . lymph# 12/29/2014 1.6  0.9 - 3.3 10e3/uL Final  . MONO# 12/29/2014 0.8  0.1 - 0.9 10e3/uL Final  . Eosinophils Absolute 12/29/2014 0.2  0.0 - 0.5 10e3/uL Final  . Basophils Absolute 12/29/2014 0.0  0.0 - 0.1 10e3/uL Final  . NEUT% 12/29/2014 68.7  39.0 - 75.0 % Final  . LYMPH% 12/29/2014 19.2  14.0 - 49.0 % Final  . MONO% 12/29/2014 9.9  0.0 - 14.0 % Final  . EOS% 12/29/2014 2.0  0.0 - 7.0 % Final  . BASO% 12/29/2014 0.2  0.0 - 2.0 % Final  . Sodium 12/29/2014 138  136 - 145 mEq/L Final  . Potassium 12/29/2014 3.5  3.5 - 5.1 mEq/L Final  . Chloride 12/29/2014 104  98 - 109 mEq/L Final  . CO2 12/29/2014 23  22 - 29 mEq/L Final  . Glucose 12/29/2014 111  70 - 140 mg/dl Final   Glucose reference range is for nonfasting patients. Fasting glucose reference range is 70- 100.  Marland Kitchen BUN 12/29/2014 9.8   7.0 - 26.0 mg/dL Final  . Creatinine 12/29/2014 1.2  0.7 - 1.3 mg/dL Final  . Total Bilirubin 12/29/2014 <0.30  0.20 - 1.20 mg/dL Final  . Alkaline Phosphatase 12/29/2014 108  40 - 150 U/L Final  . AST 12/29/2014 17  5 - 34 U/L Final  . ALT 12/29/2014 21  0 - 55 U/L Final  . Total Protein 12/29/2014 7.7  6.4 - 8.3 g/dL Final  . Albumin 12/29/2014 2.2* 3.5 - 5.0 g/dL Final  . Calcium 12/29/2014 9.4  8.4 - 10.4 mg/dL Final  . Anion Gap 12/29/2014 10  3 - 11 mEq/L Final  . EGFR 12/29/2014 78* >90 ml/min/1.73 m2 Final   eGFR is calculated using the CKD-EPI Creatinine Equation (2009)  Admission on 12/27/2014, Discharged on 12/28/2014  Component Date Value Ref Range Status  . WBC 12/28/2014 8.5  4.0 - 10.5 K/uL Final  . RBC 12/28/2014 3.13* 4.22 - 5.81 MIL/uL Final  . Hemoglobin 12/28/2014 10.1* 13.0 - 17.0 g/dL Final  . HCT 12/28/2014 30.6* 39.0 - 52.0 % Final  . MCV 12/28/2014 97.8  78.0 - 100.0 fL Final  . MCH 12/28/2014 32.3  26.0 - 34.0 pg Final  . MCHC 12/28/2014 33.0  30.0 - 36.0 g/dL Final  . RDW 12/28/2014 14.4  11.5 - 15.5 % Final  . Platelets 12/28/2014 372  150 - 400 K/uL Final  . Neutrophils Relative % 12/28/2014 59   Final  . Neutro Abs 12/28/2014 5.0  1.7 - 7.7 K/uL Final  . Lymphocytes Relative 12/28/2014 26   Final  . Lymphs Abs 12/28/2014 2.2  0.7 - 4.0 K/uL Final  . Monocytes Relative 12/28/2014 14   Final  . Monocytes Absolute 12/28/2014 1.2* 0.1 - 1.0 K/uL Final  . Eosinophils Relative 12/28/2014 1   Final  . Eosinophils Absolute 12/28/2014 0.1  0.0 - 0.7 K/uL Final  . Basophils Relative 12/28/2014 0   Final  . Basophils Absolute 12/28/2014 0.0  0.0 - 0.1 K/uL Final  . Sodium 12/28/2014 137  135 -  145 mmol/L Final  . Potassium 12/28/2014 3.9  3.5 - 5.1 mmol/L Final  . Chloride 12/28/2014 105  101 - 111 mmol/L Final  . CO2 12/28/2014 25  22 - 32 mmol/L Final  . Glucose, Bld 12/28/2014 114* 65 - 99 mg/dL Final  . BUN 12/28/2014 11  6 - 20 mg/dL Final  . Creatinine,  Ser 12/28/2014 1.23  0.61 - 1.24 mg/dL Final  . Calcium 12/28/2014 8.7* 8.9 - 10.3 mg/dL Final  . Total Protein 12/28/2014 7.0  6.5 - 8.1 g/dL Final  . Albumin 12/28/2014 2.5* 3.5 - 5.0 g/dL Final  . AST 12/28/2014 20  15 - 41 U/L Final  . ALT 12/28/2014 25  17 - 63 U/L Final  . Alkaline Phosphatase 12/28/2014 87  38 - 126 U/L Final  . Total Bilirubin 12/28/2014 0.3  0.3 - 1.2 mg/dL Final  . GFR calc non Af Amer 12/28/2014 >60  >60 mL/min Final  . GFR calc Af Amer 12/28/2014 >60  >60 mL/min Final   Comment: (NOTE) The eGFR has been calculated using the CKD EPI equation. This calculation has not been validated in all clinical situations. eGFR's persistently <60 mL/min signify possible Chronic Kidney Disease.   . Anion gap 12/28/2014 7  5 - 15 Final  . Prothrombin Time 12/28/2014 15.5* 11.6 - 15.2 seconds Final  . INR 12/28/2014 1.21  0.00 - 1.49 Final  . Color, Urine 12/28/2014 YELLOW  YELLOW Final  . APPearance 12/28/2014 CLOUDY* CLEAR Final  . Specific Gravity, Urine 12/28/2014 1.010  1.005 - 1.030 Final  . pH 12/28/2014 6.0  5.0 - 8.0 Final  . Glucose, UA 12/28/2014 NEGATIVE  NEGATIVE mg/dL Final  . Hgb urine dipstick 12/28/2014 LARGE* NEGATIVE Final  . Bilirubin Urine 12/28/2014 NEGATIVE  NEGATIVE Final  . Ketones, ur 12/28/2014 NEGATIVE  NEGATIVE mg/dL Final  . Protein, ur 12/28/2014 30* NEGATIVE mg/dL Final  . Nitrite 12/28/2014 NEGATIVE  NEGATIVE Final  . Leukocytes, UA 12/28/2014 NEGATIVE  NEGATIVE Final  . Specimen Description 12/28/2014 URINE, CLEAN CATCH   Final  . Special Requests 12/28/2014 NONE   Final  . Culture 12/28/2014    Final                   Value:NO GROWTH 1 DAY Performed at [facility]   . Report Status 12/28/2014 12/29/2014 FINAL   Final  . Squamous Epithelial / LPF 12/28/2014 NONE SEEN  NONE SEEN Final   Please note change in reference range.  . WBC, UA 12/28/2014 0-5  0 - 5 WBC/hpf Final   Please note change in reference range.  . RBC / HPF  12/28/2014 TOO NUMEROUS TO COUNT  0 - 5 RBC/hpf Final   Please note change in reference range.  . Bacteria, UA 12/28/2014 NONE SEEN  NONE SEEN Final   Please note change in reference range.  . Total CK 12/28/2014 86  49 - 397 U/L Final     RADIOGRAPHIC STUDIES: Dg Chest 2 View  12/28/2014  CLINICAL DATA:  Fever, night sweats, and chills.  Evaluate for TB. EXAM: CHEST  2 VIEW COMPARISON:  CT chest 01/30/2014. FINDINGS: Normal heart size and pulmonary vascularity. No focal airspace disease or consolidation in the lungs. No blunting of costophrenic angles. No pneumothorax. Mediastinal contours appear intact. Focal pleural thickening along the right apex corresponding to an old fracture of the right anterior second rib on previous chest CT. No evidence of infiltration, cavitation, or effusion to suggest TB. IMPRESSION:  No evidence of active pulmonary disease. No radiographic evidence to suggest active TB. Electronically Signed   By: Lucienne Capers M.D.   On: 12/28/2014 02:08   Ct Abdomen Pelvis W Contrast  12/28/2014  CLINICAL DATA:  Acute onset of right lower quadrant abdominal pain and hematuria. Fever, nausea, chills and diaphoresis. Initial encounter. EXAM: CT ABDOMEN AND PELVIS WITH CONTRAST TECHNIQUE: Multidetector CT imaging of the abdomen and pelvis was performed using the standard protocol following bolus administration of intravenous contrast. CONTRAST:  137m OMNIPAQUE IOHEXOL 300 MG/ML  SOLN COMPARISON:  CT of the abdomen and pelvis performed 02/03/2014 FINDINGS: Mild bibasilar atelectasis is noted. The liver and spleen are unremarkable in appearance. The gallbladder is within normal limits. The pancreas and adrenal glands are unremarkable. A 1.2 cm cyst is noted at the interpole region of the left kidney. The kidneys are otherwise unremarkable. There is no evidence of hydronephrosis. No renal or ureteral stones are seen. No perinephric stranding is appreciated. No free fluid is identified.  The small bowel is unremarkable in appearance. The stomach is within normal limits. No acute vascular abnormalities are seen. The appendix is normal in caliber, without evidence of appendicitis. Contrast progresses to the level of the rectum. The colon is unremarkable in appearance. The bladder is mildly distended and grossly unremarkable. The prostate remains normal in size. No inguinal lymphadenopathy is seen. No acute osseous abnormalities are identified. IMPRESSION: 1. No acute abnormality seen in the abdomen or pelvis. 2. Small left renal cyst noted. 3. Mild bibasilar atelectasis noted. Electronically Signed   By: JGarald BaldingM.D.   On: 12/28/2014 07:02    ASSESSMENT/PLAN:    [place] Patient continues to take his Revlimid oral therapy as directed.  He last received Zometa infusion on 11/14/2014.  Labs obtained today reveal a WBC of 8.3, ANC 5.7, hemoglobin 11.2, and platelet count 410.  Patient is scheduled to return 02/05/2015 for labs, visit, and his next Zometa infusion.      Hordeolum externum of right eye Patient reports a 10 day history of a lesion to his right upper eyelid.  He complains of slight discomfort to the site; but no burning or stinging.  He also denies any vision changes whatsoever.  He denies any recent fevers or chills.  On exam it appears that pt has a right upper eyelid hordeolum.    Patient was advised to use warm, moist compresses to the site between 3 and 6 times per day.  He was also given a prescription for erythromycin ointment to apply to the site.  He was advised not to let the tip of the ointment tube touch.  The site; and to also wash his hands well following each time he touches his eyes.  Patient was also advised to follow-up with ophthalmologist if the site does not improve.  PEngineer, civil (consulting)were also reviewed with the patient and sent home with him.  Patient speaks only moderate English; but stated full understanding  of all instructions today.  Also, patient confirmed that his son at home can review all instructions with patient as well.  Patient was advised to call/return or directly to the emergency department for any worsening symptoms whatsoever. _________________________________________________________ Update:  Patient with right eye hordeolum slowly resolving.  There is much less erythema and edema to the site now.  Patient was advised to continue with warm, moist compresses to the site.  As directed.  He was also encouraged to follow-up with the ophthalmologist if  symptoms persist or worsen.    UTI (urinary tract infection) Patient presented to the [facility] last Friday 12/26/14 with increased fatigue.  He denied any recent fever; but does admit to being chilled recently.  He has also noted some night sweats recently as well.  He has had decreased appetite and lost several pounds within the past 5 days.  He is also complaining of some nausea and occasional vomiting as well.  He denies any diarrhea or constipation.  He does complain of some very mild dysuria; but no frequency or flank pain.  Urinalysis obtained at that time did reveal a mild urinary tract infection; and urine cultures were pending results.  Patient's CT obtained on 02/03/2014 revealed a 9 mm cortical lesion of the left kidney, which may be a hemorrhagic cyst or a complex cystic lesion.  Reviewed all findings with on call physician, Dr. Marin Olp- and was advised to prescribe Augmentin antibiotics for treatment of probable urinary tract infection.  Over this past weekend-urine culture returned with no growth; but blood cultures were obtained that same time revealed coag-negative Staphylococcus in one bottle only.  Patient was called and requested to return to the [facility] for further follow-up/recheck.  Patient states that he continues to feel very fatigued; with mild dysuria.  He continues with chills; and questionable fevers.   Patient obtained labs.  Once again, which revealed a WBC of 8.3, ANC 5.7, hemoglobin 11.2, platelet count 410.  Urinalysis obtained today revealed a large amount of hematuria, nitrate negative, trace leukocyte esterase, too numerous to count RBCs, WBCs 3-6, and a few bacteria.  Urine culture was once again ordered and pending results.  Also, obtained blood cultures 2, peripheral sticks.  Once again, and these results are pending.  Called and spoke to Dr. Johnnye Sima.  Infectious disease physician regarding the 1 coagulation negative staph.  A culture obtained over the weekend.  He advised we drawing blood cultures and continuing on the Augmentin as previously prescribed.  Patient was advised to continue the Augmentin; and will follow back up with the patient on Thursday morning , 01/01/2015.  Patient was advised to call/return directly to the emergency department for any worsening symptoms whatsoever.    Hypoalbuminemia due to protein-calorie malnutrition ([place]) Albumen continues to decrease from 2.5 down to 2.2.  Patient was encouraged to push protein in his diet as much as possible.   Patient stated understanding of all instructions; and was in agreement with this plan of care. The patient knows to call the clinic with any problems, questions or concerns.   Review/collaboration with Dr. Jana Hakim regarding all aspects of patient's visit today.   Total time spent with patient was 40 minutes;  with greater than 75 percent of that time spent in face to face counseling regarding patient's symptoms,  and coordination of care and follow up.  Disclaimer:This dictation was prepared with Dragon/digital dictation along with [employer]. Any transcriptional errors that result from this process are unintentional.  Drue Second, NP 12/31/2014

## 2014-12-31 NOTE — Assessment & Plan Note (Signed)
Albumen continues to decrease from 2.5 down to 2.2.  Patient was encouraged to push protein in his diet as much as possible.

## 2014-12-31 NOTE — Assessment & Plan Note (Signed)
Patient reports a 10 day history of a lesion to his right upper eyelid.  He complains of slight discomfort to the site; but no burning or stinging.  He also denies any vision changes whatsoever.  He denies any recent fevers or chills.  On exam it appears that pt has a right upper eyelid hordeolum.    Patient was advised to use warm, moist compresses to the site between 3 and 6 times per day.  He was also given a prescription for erythromycin ointment to apply to the site.  He was advised not to let the tip of the ointment tube touch.  The site; and to also wash his hands well following each time he touches his eyes.  Patient was also advised to follow-up with ophthalmologist if the site does not improve.  Engineer, civil (consulting) were also reviewed with the patient and sent home with him.  Patient speaks only moderate English; but stated full understanding of all instructions today.  Also, patient confirmed that his son at home can review all instructions with patient as well.  Patient was advised to call/return or directly to the emergency department for any worsening symptoms whatsoever. _________________________________________________________ Update:  Patient with right eye hordeolum slowly resolving.  There is much less erythema and edema to the site now.  Patient was advised to continue with warm, moist compresses to the site.  As directed.  He was also encouraged to follow-up with the ophthalmologist if symptoms persist or worsen.

## 2014-12-31 NOTE — Assessment & Plan Note (Addendum)
Patient presented to the [facility] last Friday 12/26/14 with increased fatigue.  He denied any recent fever; but does admit to being chilled recently.  He has also noted some night sweats recently as well.  He has had decreased appetite and lost several pounds within the past 5 days.  He is also complaining of some nausea and occasional vomiting as well.  He denies any diarrhea or constipation.  He does complain of some very mild dysuria; but no frequency or flank pain.  Urinalysis obtained at that time did reveal a mild urinary tract infection; and urine cultures were pending results.  Patient's CT obtained on 02/03/2014 revealed a 9 mm cortical lesion of the left kidney, which may be a hemorrhagic cyst or a complex cystic lesion.  Reviewed all findings with on call physician, Dr. Marin Olp- and was advised to prescribe Augmentin antibiotics for treatment of probable urinary tract infection.  Over this past weekend-urine culture returned with no growth; but blood cultures were obtained that same time revealed coag-negative Staphylococcus in one bottle only.  Patient was called and requested to return to the [facility] for further follow-up/recheck.  Patient states that he continues to feel very fatigued; with mild dysuria.  He continues with chills; and questionable fevers.  Patient obtained labs.  Once again, which revealed a WBC of 8.3, ANC 5.7, hemoglobin 11.2, platelet count 410.  Urinalysis obtained today revealed a large amount of hematuria, nitrate negative, trace leukocyte esterase, too numerous to count RBCs, WBCs 3-6, and a few bacteria.  Urine culture was once again ordered and pending results.  Also, obtained blood cultures 2, peripheral sticks.  Once again, and these results are pending.  Called and spoke to Dr. Johnnye Sima.  Infectious disease physician regarding the 1 coagulation negative staph.  A culture obtained over the weekend.  He advised we drawing blood cultures and continuing  on the Augmentin as previously prescribed.  Patient was advised to continue the Augmentin; and will follow back up with the patient on Thursday morning , 01/01/2015.  Patient was advised to call/return directly to the emergency department for any worsening symptoms whatsoever.

## 2014-12-31 NOTE — Assessment & Plan Note (Signed)
Patient continues to take his Revlimid oral therapy as directed.  He last received Zometa infusion on 11/14/2014.  Labs obtained today reveal a WBC of 8.3, ANC 5.7, hemoglobin 11.2, and platelet count 410.  Patient is scheduled to return 02/05/2015 for labs, visit, and his next Zometa infusion.

## 2015-01-01 ENCOUNTER — Other Ambulatory Visit: Payer: Self-pay | Admitting: Oncology

## 2015-01-01 ENCOUNTER — Other Ambulatory Visit (HOSPITAL_COMMUNITY)
Admission: RE | Admit: 2015-01-01 | Discharge: 2015-01-01 | Disposition: A | Discharge status: Home / Self Care | Payer: Self-pay | Source: Ambulatory Visit | Attending: Oncology | Admitting: Oncology

## 2015-01-01 ENCOUNTER — Other Ambulatory Visit (HOSPITAL_BASED_OUTPATIENT_CLINIC_OR_DEPARTMENT_OTHER): Payer: Self-pay

## 2015-01-01 ENCOUNTER — Ambulatory Visit (HOSPITAL_BASED_OUTPATIENT_CLINIC_OR_DEPARTMENT_OTHER): Payer: Self-pay | Admitting: Nurse Practitioner

## 2015-01-01 ENCOUNTER — Ambulatory Visit (HOSPITAL_COMMUNITY)
Admission: RE | Admit: 2015-01-01 | Discharge: 2015-01-01 | Disposition: A | Discharge status: Home / Self Care | Payer: Self-pay | Source: Ambulatory Visit | Attending: Nurse Practitioner | Admitting: Nurse Practitioner

## 2015-01-01 ENCOUNTER — Ambulatory Visit (HOSPITAL_BASED_OUTPATIENT_CLINIC_OR_DEPARTMENT_OTHER): Payer: Self-pay

## 2015-01-01 ENCOUNTER — Telehealth: Payer: Self-pay | Admitting: *Deleted

## 2015-01-01 VITALS — BP 121/76 | HR 92 | Temp 98.1°F | Resp 20 | Ht 63.0 in | Wt 151.3 lb

## 2015-01-01 VITALS — BP 122/52 | HR 97 | Resp 18

## 2015-01-01 DIAGNOSIS — R110 Nausea: Secondary | ICD-10-CM

## 2015-01-01 DIAGNOSIS — N183 Chronic kidney disease, stage 3 unspecified: Secondary | ICD-10-CM

## 2015-01-01 DIAGNOSIS — R319 Hematuria, unspecified: Secondary | ICD-10-CM | POA: Insufficient documentation

## 2015-01-01 DIAGNOSIS — E860 Dehydration: Secondary | ICD-10-CM

## 2015-01-01 DIAGNOSIS — N390 Urinary tract infection, site not specified: Secondary | ICD-10-CM

## 2015-01-01 DIAGNOSIS — D630 Anemia in neoplastic disease: Secondary | ICD-10-CM

## 2015-01-01 DIAGNOSIS — R5383 Other fatigue: Secondary | ICD-10-CM

## 2015-01-01 DIAGNOSIS — H00013 Hordeolum externum right eye, unspecified eyelid: Secondary | ICD-10-CM

## 2015-01-01 DIAGNOSIS — C9000 Multiple myeloma not having achieved remission: Secondary | ICD-10-CM

## 2015-01-01 DIAGNOSIS — M899 Disorder of bone, unspecified: Secondary | ICD-10-CM

## 2015-01-01 DIAGNOSIS — R509 Fever, unspecified: Secondary | ICD-10-CM

## 2015-01-01 DIAGNOSIS — R61 Generalized hyperhidrosis: Secondary | ICD-10-CM

## 2015-01-01 LAB — URINALYSIS, MICROSCOPIC - CHCC
BILIRUBIN (URINE): NEGATIVE
Glucose: NEGATIVE mg/dL
KETONES: NEGATIVE mg/dL
Leukocyte Esterase: NEGATIVE
Nitrite: NEGATIVE
Protein: 300 mg/dL
SPECIFIC GRAVITY, URINE: 1.03 (ref 1.003–1.035)
UROBILINOGEN UR: 0.2 mg/dL (ref 0.2–1)
pH: 6 (ref 4.6–8.0)

## 2015-01-01 LAB — COMPREHENSIVE METABOLIC PANEL (CC13)
ALBUMIN: 2.3 g/dL — AB (ref 3.5–5.0)
ALK PHOS: 129 U/L (ref 40–150)
ALT: 24 U/L (ref 0–55)
ANION GAP: 9 meq/L (ref 3–11)
AST: 22 U/L (ref 5–34)
BUN: 13 mg/dL (ref 7.0–26.0)
CALCIUM: 10.2 mg/dL (ref 8.4–10.4)
CHLORIDE: 101 meq/L (ref 98–109)
CO2: 27 mEq/L (ref 22–29)
Creatinine: 1.4 mg/dL — ABNORMAL HIGH (ref 0.7–1.3)
EGFR: 62 mL/min/{1.73_m2} — AB (ref >90)
Glucose: 119 mg/dl (ref 70–140)
POTASSIUM: 3.5 meq/L (ref 3.5–5.1)
Sodium: 138 mEq/L (ref 136–145)
Total Bilirubin: <0.3 mg/dL (ref 0.20–1.20)
Total Protein: 8.4 g/dL — ABNORMAL HIGH (ref 6.4–8.3)

## 2015-01-01 LAB — CBC WITH DIFFERENTIAL/PLATELET
BASO%: 0.6 % (ref 0.0–2.0)
BASOS ABS: 0 10*3/uL (ref 0.0–0.1)
EOS ABS: 0.2 10*3/uL (ref 0.0–0.5)
EOS%: 2.3 % (ref 0.0–7.0)
HEMATOCRIT: 35.6 % — AB (ref 38.4–49.9)
HGB: 11.9 g/dL — ABNORMAL LOW (ref 13.0–17.1)
LYMPH#: 1.7 10*3/uL (ref 0.9–3.3)
LYMPH%: 21.7 % (ref 14.0–49.0)
MCH: 32.3 pg (ref 27.2–33.4)
MCHC: 33.3 g/dL (ref 32.0–36.0)
MCV: 97 fL (ref 79.3–98.0)
MONO#: 0.7 10*3/uL (ref 0.1–0.9)
MONO%: 9.3 % (ref 0.0–14.0)
NEUT#: 5.1 10*3/uL (ref 1.5–6.5)
NEUT%: 66.1 % (ref 39.0–75.0)
PLATELETS: 435 10*3/uL — AB (ref 140–400)
RBC: 3.67 10*6/uL — AB (ref 4.20–5.82)
RDW: 14.4 % (ref 11.0–14.6)
WBC: 7.7 10*3/uL (ref 4.0–10.3)

## 2015-01-01 LAB — PROTEIN / CREATININE RATIO, URINE
Creatinine, Urine: 252 mg/dL (ref 20–370)
PROTEIN CREATININE RATIO: 1675 mg/g{creat} — AB (ref 22–128)
Total Protein, Urine: 422 mg/dL — ABNORMAL HIGH (ref 5–25)

## 2015-01-01 MED ORDER — SODIUM CHLORIDE 0.9 % IV SOLN
Freq: Once | INTRAVENOUS | Status: AC
  Administered 2015-01-01: 14:00:00 via INTRAVENOUS

## 2015-01-01 MED ORDER — METHYLPREDNISOLONE 4 MG PO TBPK: ORAL_TABLET | ORAL | Status: DC

## 2015-01-01 NOTE — Patient Instructions (Addendum)
Deshidratacin en los adultos (Dehydration, Adult) La deshidratacin es un cuadro clnico que se produce cuando no se tiene la cantidad suficiente de lquido o de agua en el organismo. Se produce cuando se toma menos lquido del que se pierde. Los rganos [employer] riones, el cerebro y el [place], no pueden funcionar sin una cantidad [place] de agua y Press photographer. Cualquier prdida de lquidos del organismo puede causar deshidratacin.  La deshidratacin puede ser leve o grave. Este cuadro clnico se debe tratar de inmediato para evitar que se agrave. CAUSAS  Este cuadro clnico puede deberse a lo siguiente:  Vmitos.  Diarrea.  Exceso de sudoracin, por ejemplo, al realizar actividad fsica cuando hace calor o hay humedad.  No beber la cantidad suficiente de lquido mientras realizan actividad fsica extenuante o cuando estn enfermos.  Excesiva eliminacin de [place].  Cristy Hilts.  Algunos medicamentos. FACTORES DE RIESGO Es ms probable que este cuadro clnico se [employer] en:  Las personas que estn tomando determinados medicamentos que causan la prdida excesiva de lquido del organismo (diurticos).   Las personas que sufren una enfermedad crnica, como diabetes, que puede aumentar la miccin.  Adultos mayores.   Las personas que viven a Engineer, agricultural.   Las personas que practican deportes de resistencia.  SNTOMAS  Deshidratacin leve  Sed.  Labios secos.  Sequedad leve en la boca.  Piel seca y caliente. Deshidratacin [employer].   Calambres musculares.   Elmon Else y disminucin de la produccin de [place].   Disminucin de la produccin de lgrimas.   Dolor de [place].   Sensacin de desvanecimiento, especialmente al ponerse de pie.  Deshidratacin grave  Cambios en la piel.   Piel fra y hmeda.   La piel no vuelve rpidamente a su lugar cuando se la suelta luego de pellizcarla ligeramente.   Cambios en los lquidos  corporales.   Sed extrema.   Falta de lgrimas.   Imposibilidad de transpirar cuando la temperatura corporal es alta, por ejemplo, cuando hace calor.   Mnima produccin de [place].   Cambios en las constantes vitales.   Pulso rpido y dbil (ms de 100pulsaciones por minuto cuando est quieto).   Respiracin rpida.   Presin arterial baja.   Otros cambios.   Ojos hundidos.   Manos y pies fros.   Confusin.  Aletargamiento y dificultad para mantenerse despierto.  Desmayos (sncope).   Prdida de peso a Control and instrumentation engineer.   Prdida del conocimiento. DIAGNSTICO  Este cuadro clnico se puede diagnosticar en funcin de los sntomas. Tambin se pueden hacer estudios para determinar la gravedad de la deshidratacin. Estos estudios pueden [employer] siguientes:   Anlisis de [place].   Anlisis de [place].  TRATAMIENTO  El tratamiento de este cuadro clnico depende de la gravedad. La deshidratacin leve o moderada a menudo puede tratarse en la casa. El tratamiento se debe comenzar de inmediato. No espere hasta que la deshidratacin sea grave. La deshidratacin grave debe ser tratada en el hospital. Tratamiento para la deshidratacin leve  Beber abundante agua para reemplazar el lquido que se perdi.   Reemplazar los minerales en la sangre (electrolitos) que se pueden haber perdido.  Tratamiento para la deshidratacin moderada  Consumir una solucin de rehidratacin oral (SRO). Tratamiento para la deshidratacin grave  Recibir lquidos a travs de una va intravenosa (IV).   Recibir una solucin de Brewing technologist a travs de una sonda de alimentacin que se coloca a travs de la nariz Research scientist (medical) (sonda nasogstrica  o sonda NG).  Corregir las [facility]. INSTRUCCIONES PARA EL CUIDADO EN EL HOGAR   Beba suficiente lquido para mantener la orina clara o de color amarillo plido.   Beba lentamente pequeos sorbos de agua o de  lquido. Tambin puede chupar cubos de hielo.  Consuma alimentos o bebidas que contengan electrolitos. Como por ejemplo, bananas y bebidas deportivas.  Tome los medicamentos de venta libre y los recetados solamente como se lo haya indicado el mdico.   Prepare la solucin de rehidratacin oral de acuerdo con las indicaciones del fabricante. Tome sorbos de la solucin de rehidratacin oral cada 15mnutos hasta que la orina se normalice.  Si tiene vmitos o diarrea, siga tratando de beber agua, una solucin de rehidratacin oral o ambos.   Si tiene dEstero debe evitar lo siguiente:   Las bebidas que contengan cafena.   El jugo de frutas.   [employer]us   Las bebidas gaseosas.  No tome comprimidos de sal. Esto puede causar la acumulacin excesiva de sodio en el organismo (hipernatremia).  SOLICITE ATENCIN MDICA SI:  No puede comer ni beber sin vomitar.  Ha tenido diarrea moderada durante ms de 24horas.  Tiene fiebre. SOLICITE ATENCIN MDICA DE INMEDIATO SI:   Siente sed extrema.  Tiene una diarrea intensa.  No ha orinado durante 6 a 8horas o solo ha orinado una cantidad pequea de o[place]  Tiene la piel arrugada.  Est mareado, confundido o tiene ambos sntomas.   Esta informacin no tiene cMarine scientistel consejo del mdico. Asegrese de hacerle al mdico cualquier pregunta que tenga.   Document Released: 01/17/2005 Document Revised: 10/08/2014 Elsevier Interactive Patient Education 2[employer]

## 2015-01-01 NOTE — Patient Instructions (Signed)
  El medicamento ha sido enviado a la [place].  Volver a la clnica de manejo de sntomas despus de la ecografa y radiografa de trax.  Gregary Signs, el interpeter estar aqu para traducir [employer] a las 4:30 pm.

## 2015-01-01 NOTE — Telephone Encounter (Signed)
Pt reported to [facility] feeling worse today. Pt blood cultures still pending from Monday- Per [place] add lab and [facility] visit.

## 2015-01-02 LAB — CULTURE, BLOOD (SINGLE)

## 2015-01-02 LAB — URINE CULTURE

## 2015-01-04 LAB — CULTURE, BLOOD (SINGLE)

## 2015-01-05 LAB — KAPPA/LAMBDA LIGHT CHAINS
KAPPA FREE LGHT CHN: 10.5 mg/dL — AB (ref 0.33–1.94)
Kappa:Lambda Ratio: 3.1 — ABNORMAL HIGH (ref 0.26–1.65)
LAMBDA FREE LGHT CHN: 3.39 mg/dL — AB (ref 0.57–2.63)

## 2015-01-05 LAB — PROTEIN ELECTROPHORESIS, SERUM
ALPHA-1-GLOBULIN: 0.9 g/dL — AB (ref 0.2–0.3)
Abnormal Protein Band1: 1 g/dL
Albumin ELP: 2.5 g/dL — ABNORMAL LOW (ref 3.8–4.8)
Alpha-2-Globulin: 1.8 g/dL — ABNORMAL HIGH (ref 0.5–0.9)
BETA 2: 0.4 g/dL (ref 0.2–0.5)
BETA GLOBULIN: 0.5 g/dL (ref 0.4–0.6)
GAMMA GLOBULIN: 1.6 g/dL (ref 0.8–1.7)
Total Protein, Serum Electrophoresis: 7.5 g/dL (ref 6.1–8.1)

## 2015-01-06 ENCOUNTER — Ambulatory Visit (HOSPITAL_BASED_OUTPATIENT_CLINIC_OR_DEPARTMENT_OTHER): Payer: Self-pay | Admitting: Nurse Practitioner

## 2015-01-06 ENCOUNTER — Other Ambulatory Visit: Payer: Self-pay | Admitting: Nurse Practitioner

## 2015-01-06 ENCOUNTER — Encounter: Payer: Self-pay | Admitting: Nurse Practitioner

## 2015-01-06 ENCOUNTER — Emergency Department (HOSPITAL_COMMUNITY)
Admission: EM | Admit: 2015-01-06 | Discharge: 2015-01-06 | Disposition: A | Discharge status: Home / Self Care | Payer: Self-pay | Attending: Emergency Medicine | Admitting: Emergency Medicine

## 2015-01-06 ENCOUNTER — Ambulatory Visit (HOSPITAL_BASED_OUTPATIENT_CLINIC_OR_DEPARTMENT_OTHER): Payer: Self-pay

## 2015-01-06 ENCOUNTER — Other Ambulatory Visit: Payer: Self-pay | Admitting: Oncology

## 2015-01-06 ENCOUNTER — Encounter (HOSPITAL_COMMUNITY): Payer: Self-pay

## 2015-01-06 DIAGNOSIS — E860 Dehydration: Secondary | ICD-10-CM

## 2015-01-06 DIAGNOSIS — C9000 Multiple myeloma not having achieved remission: Secondary | ICD-10-CM

## 2015-01-06 DIAGNOSIS — Z8579 Personal history of other malignant neoplasms of lymphoid, hematopoietic and related tissues: Secondary | ICD-10-CM | POA: Insufficient documentation

## 2015-01-06 DIAGNOSIS — Z8639 Personal history of other endocrine, nutritional and metabolic disease: Secondary | ICD-10-CM | POA: Insufficient documentation

## 2015-01-06 DIAGNOSIS — Z792 Long term (current) use of antibiotics: Secondary | ICD-10-CM | POA: Insufficient documentation

## 2015-01-06 DIAGNOSIS — N390 Urinary tract infection, site not specified: Secondary | ICD-10-CM

## 2015-01-06 DIAGNOSIS — Z79899 Other long term (current) drug therapy: Secondary | ICD-10-CM | POA: Insufficient documentation

## 2015-01-06 DIAGNOSIS — R300 Dysuria: Secondary | ICD-10-CM | POA: Insufficient documentation

## 2015-01-06 DIAGNOSIS — R319 Hematuria, unspecified: Secondary | ICD-10-CM | POA: Insufficient documentation

## 2015-01-06 DIAGNOSIS — N183 Chronic kidney disease, stage 3 (moderate): Secondary | ICD-10-CM | POA: Insufficient documentation

## 2015-01-06 DIAGNOSIS — Z8739 Personal history of other diseases of the musculoskeletal system and connective tissue: Secondary | ICD-10-CM | POA: Insufficient documentation

## 2015-01-06 LAB — CBC WITH DIFFERENTIAL/PLATELET
BASOS PCT: 0 %
Basophils Absolute: 0 10*3/uL (ref 0.0–0.1)
EOS ABS: 0.1 10*3/uL (ref 0.0–0.7)
EOS PCT: 1 %
HCT: 30.4 % — ABNORMAL LOW (ref 39.0–52.0)
Hemoglobin: 9.7 g/dL — ABNORMAL LOW (ref 13.0–17.0)
LYMPHS ABS: 1.8 10*3/uL (ref 0.7–4.0)
Lymphocytes Relative: 18 %
MCH: 31.9 pg (ref 26.0–34.0)
MCHC: 31.9 g/dL (ref 30.0–36.0)
MCV: 100 fL (ref 78.0–100.0)
MONO ABS: 1.5 10*3/uL — AB (ref 0.1–1.0)
MONOS PCT: 15 %
NEUTROS PCT: 66 %
Neutro Abs: 6.6 10*3/uL (ref 1.7–7.7)
PLATELETS: 584 10*3/uL — AB (ref 150–400)
RBC: 3.04 MIL/uL — ABNORMAL LOW (ref 4.22–5.81)
RDW: 14.4 % (ref 11.5–15.5)
WBC: 10 10*3/uL (ref 4.0–10.5)

## 2015-01-06 LAB — URINE MICROSCOPIC-ADD ON

## 2015-01-06 LAB — COMPREHENSIVE METABOLIC PANEL
ALBUMIN: 2.2 g/dL — AB (ref 3.5–5.0)
ALT: 20 U/L (ref 17–63)
ANION GAP: 6 (ref 5–15)
AST: 14 U/L — ABNORMAL LOW (ref 15–41)
Alkaline Phosphatase: 80 U/L (ref 38–126)
BUN: 16 mg/dL (ref 6–20)
CALCIUM: 8.2 mg/dL — AB (ref 8.9–10.3)
CO2: 28 mmol/L (ref 22–32)
Chloride: 108 mmol/L (ref 101–111)
Creatinine, Ser: 1.04 mg/dL (ref 0.61–1.24)
GFR calc non Af Amer: >60 mL/min (ref >60)
GLUCOSE: 102 mg/dL — AB (ref 65–99)
POTASSIUM: 3.6 mmol/L (ref 3.5–5.1)
SODIUM: 142 mmol/L (ref 135–145)
Total Bilirubin: 0.1 mg/dL — ABNORMAL LOW (ref 0.3–1.2)
Total Protein: 6.2 g/dL — ABNORMAL LOW (ref 6.5–8.1)

## 2015-01-06 LAB — URINALYSIS, ROUTINE W REFLEX MICROSCOPIC
BILIRUBIN URINE: NEGATIVE
Glucose, UA: NEGATIVE mg/dL
KETONES UR: NEGATIVE mg/dL
NITRITE: NEGATIVE
PH: 6 (ref 5.0–8.0)
Protein, ur: 100 mg/dL — AB
SPECIFIC GRAVITY, URINE: 1.016 (ref 1.005–1.030)

## 2015-01-06 LAB — CYTOLOGY, URINE

## 2015-01-06 LAB — LIPASE, BLOOD: Lipase: 23 U/L (ref 11–51)

## 2015-01-06 MED ORDER — SODIUM CHLORIDE 0.9 % IV SOLN
Freq: Once | INTRAVENOUS | Status: AC
  Administered 2015-01-06: 11:00:00 via INTRAVENOUS
  Filled 2015-01-06: qty 4

## 2015-01-06 MED ORDER — SODIUM CHLORIDE 0.9 % IV SOLN
INTRAVENOUS | Status: AC
  Administered 2015-01-06: 10:00:00 via INTRAVENOUS

## 2015-01-06 NOTE — ED Notes (Signed)
[place] can be reached at [phone] or [phone].

## 2015-01-06 NOTE — Progress Notes (Signed)
SYMPTOM MANAGEMENT CLINIC   HPI: Charles [name] 39 y.o. male diagnosed with multiple myeloma; with bone metastasis.  Currently undergoing Revlimid oral therapy and Zometa infusions.   Patient presented to the [facility] last Friday 12/26/14 with increased fatigue.  He denied any recent fever; but does admit to being chilled recently.  He has also noted some night sweats recently as well.  He has had decreased appetite and lost several pounds within the past 5 days.  He is also complaining of some nausea and occasional vomiting as well.  He denies any diarrhea or constipation.  He does complain of some very mild dysuria; but no frequency or flank pain.  Urinalysis obtained at that time did reveal a mild urinary tract infection; and urine cultures were pending results.  Patient's CT obtained on 02/03/2014 revealed a 9 mm cortical lesion of the left kidney, which may be a hemorrhagic cyst or a complex cystic lesion.  Reviewed all findings with on call physician, Dr. Marin Olp- and was advised to prescribe Augmentin antibiotics for treatment of probable urinary tract infection.  Over this past weekend-urine culture returned with no growth; but blood cultures were obtained that same time revealed coag-negative Staphylococcus in one bottle only.  Patient was called and requested to return to the [facility] for further follow-up/recheck.  Patient states that he continues to feel very fatigued; with mild dysuria.  He continues with chills; and questionable fevers.  Patient obtained labs.  Once again, which revealed a WBC of 8.3, ANC 5.7, hemoglobin 11.2, platelet count 410.  Urinalysis obtained today revealed a large amount of hematuria, nitrate negative, trace leukocyte esterase, too numerous to count RBCs, WBCs 3-6, and a few bacteria.  Urine culture was once again ordered and pending results.  Also, obtained blood cultures 2, peripheral sticks.  Once again, and these results are  pending.  Called and spoke to Dr. Johnnye Sima.  Infectious disease physician regarding the 1 coagulation negative staph.  A culture obtained over the weekend.  He advised we drawing blood cultures and continuing on the Augmentin as previously prescribed.  Patient was advised to continue the Augmentin; and will follow back up with the patient on Thursday morning , 01/01/2015. _______________________________________________________  Update:  Patient presented once again to the [facility] as a walk-in for recheck.  He continues to complain of increased fatigue, intermittent low-grade fevers and chills, and some night sweats.  He also continues to complain of some chronic nausea but no vomiting.  He denies any diarrhea or constipation.  He complains of a vague discomfort to his lower abdominal area.    CT of the abdomen and pelvis obtained last week was essentially negative for any acute findings.  Renal ultrasound obtained today revealed: IMPRESSION: 1. Increased renal cortical echogenicity bilaterally as can be seen with medical renal disease. 2. No nephrolithiasis.  Chest x-ray obtained today was negative for acute findings as well.  Blood cultures obtained.  Again with results pending.  Urinalysis obtained once again today revealed a large amount of blood, nitrate negative, leukocyte esterase negative, RBCs too numerous to count, WBCs 21-50, and many bacteria.  Urine culture results pending.  Patient is almost finished with his Augmentin antibiotic as prescribed.  The plan is for the patient to obtain a urology referral for further evaluation and management as soon as possible.  In the meantime.-Patient was advised to call/return or go directly to the emergency department for any worsening symptoms whatsoever.  Abdominal Pain  Review of Systems  Gastrointestinal: Positive for abdominal pain.    Past Medical History  Diagnosis Date  . 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 CKD (chronic kidney  disease), stage III   . Hypercholesterolemia   . Lytic bone lesions on xray     History reviewed. No pertinent past surgical history.  has Lytic bone lesions on xray; Multiple myeloma (HCC); CKD (chronic kidney disease) stage 3, GFR 30-59 ml/min; Anemia in neoplastic disease; Hordeolum externum of right eye; UTI (urinary tract infection); Hypoalbuminemia due to protein-calorie malnutrition ([place]); and Dehydration on his problem list.    has No Known Allergies.    Medication List       This list is accurate as of: 01/06/15 11:24 AM.  Always use your most recent med list.               acyclovir 400 MG tablet  Commonly known as:  ZOVIRAX  Take 1 tablet (400 mg total) by mouth 2 (two) times daily.     amoxicillin-clavulanate 875-125 MG tablet  Commonly known as:  AUGMENTIN  Take 1 tablet by mouth 2 (two) times daily.     erythromycin ophthalmic ointment  Place 1 application into the right eye 4 (four) times daily.     fluconazole 100 MG tablet  Commonly known as:  DIFLUCAN  Take 1 tablet (100 mg total) by mouth daily.     lenalidomide 10 MG capsule  Commonly known as:  REVLIMID  Take 1 capsule (10 mg total) by mouth daily.     methylPREDNISolone 4 MG Tbpk tablet  Commonly known as:  MEDROL DOSEPAK  Take as directed     ondansetron 8 MG tablet  Commonly known as:  ZOFRAN  Take 1 tablet (8 mg total) by mouth every 8 (eight) hours as needed for nausea or vomiting.     sulfamethoxazole-trimethoprim 800-160 MG tablet  Commonly known as:  BACTRIM DS,SEPTRA DS  Take 1 tablet by mouth 2 (two) times daily.         PHYSICAL EXAMINATION  Oncology Vitals 01/06/2015 01/01/2015  Height - -  Weight - -  Weight (lbs) - -  BMI (kg/m2) - -  Temp 97.5 -  Pulse 80 97  Resp - 18  SpO2 100 100  BSA (m2) - -   BP Readings from Last 2 Encounters:  01/06/15 131/89  01/01/15 121/76    Physical Exam  Constitutional: He is oriented to person, place, and time. Vital signs are  normal. He appears unhealthy.  HENT:  Head: Normocephalic and atraumatic.  Mouth/Throat: Oropharynx is clear and moist.  Eyes: Conjunctivae and EOM are normal. Pupils are equal, round, and reactive to light. Right eye exhibits no discharge. Left eye exhibits no discharge. No scleral icterus.  Neck: Normal range of motion. Neck supple. No JVD present. No tracheal deviation present. No thyromegaly present.  Cardiovascular: Normal rate, regular rhythm, normal heart sounds and intact distal pulses.   Pulmonary/Chest: Effort normal and breath sounds normal. No respiratory distress. He has no wheezes. He has no rales. He exhibits no tenderness.  Abdominal: Soft. Bowel sounds are normal. He exhibits no distension and no mass. There is no tenderness. There is no rebound and no guarding.  Genitourinary:  No flank pain.  Musculoskeletal: Normal range of motion. He exhibits no edema or tenderness.  Lymphadenopathy:    He has no cervical adenopathy.  Neurological: He is alert and oriented to person, place, and time. Gait normal.  Skin: Skin  is warm and dry. No rash noted. No erythema. There is pallor.  Psychiatric: Affect normal.  Nursing note and vitals reviewed.  you should follow along with a will  LABORATORY DATA:. No visits with results within 3 Day(s) from this visit. Latest known visit with results is:  Appointment on 01/01/2015  Component Date Value Ref Range Status  . Creatinine, Urine 01/01/2015 252  20 - 370 mg/dL Final  . Total Protein, Urine 01/01/2015 422* 5 - 25 mg/dL Final   Result repeated and verified.Result confirmed by automatic dilution.  . Protein Creatinine Ratio 01/01/2015 1675* 22 - 128 mg/g creat Final  . Kappa free light chain 01/01/2015 10.50* 0.33 - 1.94 mg/dL Final  . Lambda Free Lght Chn 01/01/2015 3.39* 0.57 - 2.63 mg/dL Final  . Kappa:Lambda Ratio 01/01/2015 3.10* 0.26 - 1.65 Final  . Total Protein, Serum Electrophores* 01/01/2015 7.5  6.1 - 8.1 g/dL Final  .  Albumin ELP 01/01/2015 2.5* 3.8 - 4.8 g/dL Final  . Alpha-1-Globulin 01/01/2015 0.9* 0.2 - 0.3 g/dL Final  . Alpha-2-Globulin 01/01/2015 1.8* 0.5 - 0.9 g/dL Final  . Beta Globulin 01/01/2015 0.5  0.4 - 0.6 g/dL Final  . Beta 2 01/01/2015 0.4  0.2 - 0.5 g/dL Final  . Gamma Globulin 01/01/2015 1.6  0.8 - 1.7 g/dL Final  . Abnormal Protein Band1 01/01/2015 1.0   Final  . SPE Interp. 01/01/2015 *   Final   Comment: A restricted band consistent with monoclonal protein is present.The monoclonal protein peak accounts for 1.0 g/dL of the total1.6 g/dL of protein in the gamma region. The possibility of a faint restricted band(s) cannot be completelyexcluded in the  BETA-1 region.Suggest serum IFE if clinically indicated.                           (Lab will hold sample one week).Results are consistent with SPE performed on 12/15/2014 Reviewed by Luna Fuse C. Mammarappallil MD (Electronic Signature on File)   . COMMENT (PROTEIN ELECTROPHOR) 01/01/2015 *   Final   Comment: ---------------Serum protein electrophoresis is a useful screening procedure in thedetection of various pathophysiologic states such as inflammation,gammopathies, protein loss and other dysproteinemias.  Immunofixationelectrophoresis (IFE) is a more  sensitive technique for theidentification of M-proteins found in patients with monoclonalgammopathy of unknown significance (MGUS), amyloidosis, early ortreated myeloma or macroglobulinemia, solitary plasmacytoma orextramedullary plasmacytoma.   . Abnormal Protein Band2 01/01/2015 NOT DET   Final  . Abnormal Protein Band3 01/01/2015 NOT DET   Final  . WBC 01/01/2015 7.7  4.0 - 10.3 10e3/uL Final  . NEUT# 01/01/2015 5.1  1.5 - 6.5 10e3/uL Final  . HGB 01/01/2015 11.9* 13.0 - 17.1 g/dL Final  . HCT 01/01/2015 35.6* 38.4 - 49.9 % Final  . Platelets 01/01/2015 435* 140 - 400 10e3/uL Final  . MCV 01/01/2015 97.0  79.3 - 98.0 fL Final  . MCH 01/01/2015 32.3  27.2 - 33.4 pg Final  . MCHC 01/01/2015  33.3  32.0 - 36.0 g/dL Final  . RBC 01/01/2015 3.67* 4.20 - 5.82 10e6/uL Final  . RDW 01/01/2015 14.4  11.0 - 14.6 % Final  . lymph# 01/01/2015 1.7  0.9 - 3.3 10e3/uL Final  . MONO# 01/01/2015 0.7  0.1 - 0.9 10e3/uL Final  . Eosinophils Absolute 01/01/2015 0.2  0.0 - 0.5 10e3/uL Final  . Basophils Absolute 01/01/2015 0.0  0.0 - 0.1 10e3/uL Final  . NEUT% 01/01/2015 66.1  39.0 - 75.0 % Final  . LYMPH%  01/01/2015 21.7  14.0 - 49.0 % Final  . MONO% 01/01/2015 9.3  0.0 - 14.0 % Final  . EOS% 01/01/2015 2.3  0.0 - 7.0 % Final  . BASO% 01/01/2015 0.6  0.0 - 2.0 % Final  . Urine Culture, Routine 01/01/2015 Culture, Urine   Final   Comment: Final - ===== COLONY COUNT: ===== NO GROWTH NO GROWTH   . Glucose 01/01/2015 Negative  Negative mg/dL Final  . Bilirubin (Urine) 01/01/2015 Negative  Negative Final  . Ketones 01/01/2015 Negative  Negative mg/dL Final  . Specific Gravity, Urine 01/01/2015 1.030  1.003 - 1.035 Final  . Blood 01/01/2015 Large  Negative Final  . pH 01/01/2015 6.0  4.6 - 8.0 Final  . Protein 01/01/2015 300  Negative- <30 mg/dL Final  . Urobilinogen, UR 01/01/2015 0.2  0.2 - 1 mg/dL Final  . Nitrite 01/01/2015 Negative  Negative Final  . Leukocyte Esterase 01/01/2015 Negative  Negative Final  . RBC / HPF 01/01/2015 TNTC  0 - 2 Final  . WBC, UA 01/01/2015 21-50  0 - 2 Final  . Bacteria, UA 01/01/2015 Many  Negative- Trace Final  . Epithelial Cells 01/01/2015 Few  Negative- Few Final  . Yeast, UA 01/01/2015 Few  Negative Final  . Sodium 01/01/2015 138  136 - 145 mEq/L Final  . Potassium 01/01/2015 3.5  3.5 - 5.1 mEq/L Final  . Chloride 01/01/2015 101  98 - 109 mEq/L Final  . CO2 01/01/2015 27  22 - 29 mEq/L Final  . Glucose 01/01/2015 119  70 - 140 mg/dl Final   Glucose reference range is for nonfasting patients. Fasting glucose reference range is 70- 100.  Marland Kitchen BUN 01/01/2015 13.0  7.0 - 26.0 mg/dL Final  . Creatinine 01/01/2015 1.4* 0.7 - 1.3 mg/dL Final  . Total Bilirubin  01/01/2015 <0.30  0.20 - 1.20 mg/dL Final  . Alkaline Phosphatase 01/01/2015 129  40 - 150 U/L Final  . AST 01/01/2015 22  5 - 34 U/L Final  . ALT 01/01/2015 24  0 - 55 U/L Final  . Total Protein 01/01/2015 8.4* 6.4 - 8.3 g/dL Final  . Albumin 01/01/2015 2.3* 3.5 - 5.0 g/dL Final  . Calcium 01/01/2015 10.2  8.4 - 10.4 mg/dL Final  . Anion Gap 01/01/2015 9  3 - 11 mEq/L Final  . EGFR 01/01/2015 62* >90 ml/min/1.73 m2 Final   eGFR is calculated using the CKD-EPI Creatinine Equation (2009)     RADIOGRAPHIC STUDIES: No results found.  ASSESSMENT/PLAN:    Dehydration Patient complains of increasing nausea; but no vomiting.  He denies any diarrhea or constipation.  He does feel dehydrated today; and will be given IV fluid rehydration while at the cancer Center today.  He was also encouraged to push fluids at home is much as possible.    Multiple myeloma ([place]) Patient continues to take his Revlimid oral therapy as directed.  He last received Zometa infusion on 11/14/2014.  Labs obtained today reveal a WBC of 7.7, ANC 5.1, hemoglobin 11.9, platelet count 435.  Myeloma marker lab results pending.  Patient is scheduled to return 02/05/2015 for labs, visit, and his next Zometa infusion. ---------------------------------------------------------------------------------  Update: Patient presented to the [facility] today as a walk-in patient.  He complains of continued nausea, and only occasional vomiting.  He denies any diarrhea or constipation.  He continues to complain of some low-grade fevers and occasional chills.  He also has been noting some increased night sweats as well.  He also continues  to complain of a vague discomfort to his lower abdominal pelvis region.  He also reports continued gross hematuria.  Of note-multiple myeloma markers obtained last week now resulted; and do appear elevated.  Reviewed all findings with Dr. Jana Hakim today; and he advised patient go directed to the  emergency department for further evaluation and management today.  Dr. Jana Hakim recommends a urology consult for further evaluation as soon as possible.  Brief history report were called to the emergency department charge nurse; prior to the patient being transported to the emergency department for further evaluation and management via wheelchair.  Per the cancer Center nurse.          UTI (urinary tract infection) Patient presented to the [facility] last Friday 12/26/14 with increased fatigue.  He denied any recent fever; but does admit to being chilled recently.  He has also noted some night sweats recently as well.  He has had decreased appetite and lost several pounds within the past 5 days.  He is also complaining of some nausea and occasional vomiting as well.  He denies any diarrhea or constipation.  He does complain of some very mild dysuria; but no frequency or flank pain.  Urinalysis obtained at that time did reveal a mild urinary tract infection; and urine cultures were pending results.  Patient's CT obtained on 02/03/2014 revealed a 9 mm cortical lesion of the left kidney, which may be a hemorrhagic cyst or a complex cystic lesion.  Reviewed all findings with on call physician, Dr. Marin Olp- and was advised to prescribe Augmentin antibiotics for treatment of probable urinary tract infection.  Over this past weekend-urine culture returned with no growth; but blood cultures were obtained that same time revealed coag-negative Staphylococcus in one bottle only.  Patient was called and requested to return to the [facility] for further follow-up/recheck.  Patient states that he continues to feel very fatigued; with mild dysuria.  He continues with chills; and questionable fevers.  Patient obtained labs.  Once again, which revealed a WBC of 8.3, ANC 5.7, hemoglobin 11.2, platelet count 410.  Urinalysis obtained today revealed a large amount of hematuria, nitrate negative, trace  leukocyte esterase, too numerous to count RBCs, WBCs 3-6, and a few bacteria.  Urine culture was once again ordered and pending results.  Also, obtained blood cultures 2, peripheral sticks.  Once again, and these results are pending.  Called and spoke to Dr. Johnnye Sima.  Infectious disease physician regarding the 1 coagulation negative staph.  A culture obtained over the weekend.  He advised we drawing blood cultures and continuing on the Augmentin as previously prescribed.  Patient was advised to continue the Augmentin; and will follow back up with the patient on Thursday morning , 01/01/2015. _______________________________________________________    CT of the abdomen and pelvis obtained last week was essentially negative for any acute findings.  Renal ultrasound obtained today revealed: IMPRESSION: 1. Increased renal cortical echogenicity bilaterally as can be seen with medical renal disease. 2. No nephrolithiasis.  Chest x-ray obtained today was negative for acute findings as well.  Blood cultures obtained.  Again with results pending.  Urinalysis obtained once again today revealed a large amount of blood, nitrate negative, leukocyte esterase negative, RBCs too numerous to count, WBCs 21-50, and many bacteria.  Urine culture results pending.  Patient is almost finished with his Augmentin antibiotic as prescribed.  The plan is for the patient to obtain a urology referral for further evaluation and management as soon as possible.  In the meantime.-Patient was advised to call/return or go directly to the emergency department for any worsening symptoms whatsoever.      Reviewed all with pt in detail prior to transferring pt to ED.  Translator has been called to accompany pt to the ED.   Patient stated understanding of all instructions; and was in agreement with this plan of care. The patient knows to call the clinic with any problems, questions or concerns.   Review/collaboration  with Dr. Jana Hakim regarding all aspects of patient's visit today.   Total time spent with patient was 15 minutes;  with greater than 75 percent of that time spent in face to face counseling regarding patient's symptoms,  and coordination of care and follow up.  Disclaimer:This dictation was prepared with Dragon/digital dictation along with [employer]. Any transcriptional errors that result from this process are unintentional.  Drue Second, NP 01/06/2015

## 2015-01-06 NOTE — Discharge Instructions (Signed)
It is important for you to follow-up with urology, Dr. Tresa Moore, this week in order to schedule an appointment for reevaluation. Return to ED for worsening or new symptoms including fevers, chills, abdominal pain or blood clots in your urine.  Hematuria - Adultos (Hematuria, Adult) La hematuria es la presencia de sangre en la orina. La causa puede ser una infeccin en la vejiga, en los riones, en la prstata, clculos renales o cncer de las vas urinarias. Normalmente las infecciones pueden tratarse con medicamentos, y los clculos renales, por lo general, se eliminarn a travs de la orina. Si ninguna de estas es la causa de su hematuria, quizs se necesiten ms estudios para Education officer, community. Es muy importante que le informe a su mdico si ve sangre en la [place], aunque el sangrado se detenga sin tratamiento o no cause dolor. La sangre que aparece en la orina y luego se detiene y vuelve a aparecer nuevamente puede ser un sntoma de una enfermedad muy grave. Adems, el dolor no es un sntoma en las etapas iniciales de muchos tipos de cncer urinarios. INSTRUCCIONES PARA EL CUIDADO EN EL HOGAR   Beba mucho lquido, entre 3 a Software engineer. Si le han diagnosticado una infeccin, se recomienda especialmente el jugo de arndanos rojos, adems de grandes cantidades de [place].  Evite la cafena, el t y las bebidas con gas porque suelen irritar la vejiga.  Evite el alcohol ya que puede Engineer, manufacturing.  Tome todos los [employer] se lo haya indicado el mdico.  Si le recetaron antibiticos, asegrese de terminarlos, incluso si comienza a sentirse mejor.  Si le han diagnosticado clculos renales, siga las instrucciones de su mdico con respecto a Astronomer la orina para Research scientist (medical) clculo.  Vace la vejiga con frecuencia. Evite retener la orina durante largos perodos.  Despus de defecar, las mujeres deben higienizarse desde adelante hacia atrs. Use solo un papel por vez.  Si es [employer], vace la vejiga antes y despus de Clinical biochemist. SOLICITE ATENCIN MDICA SI:  Siente dolor en la espalda.  Tiene fiebre.  Tiene sensacin de Higher education careers adviser (nuseas) o vmitos.  Los sntomas no mejoran despus de 3 das. Si la condicin empeora, visite al mdico antes de lo previsto. SOLICITE ATENCIN MDICA DE INMEDIATO SI:   Vomita con frecuencia e intensamente y no puede tolerar los medicamentos.  Siente un dolor intenso en la espalda o el abdomen incluso tomando los medicamentos.  Elimina cogulos grandes o sangre con la [place].  Se siente extremadamente dbil, se desmaya o pierde el conocimiento. ASEGRESE DE QUE:   Comprende estas instrucciones.  Controlar su afeccin.  Recibir ayuda de inmediato si no mejora o si empeora.   Esta informacin no tiene Marine scientist el consejo del mdico. Asegrese de hacerle al mdico cualquier pregunta que tenga.   Document Released: 01/17/2005 Document Revised: 02/07/2014 Elsevier Interactive Patient Education [employer].

## 2015-01-06 NOTE — ED Provider Notes (Signed)
CSN: [id number]     Arrival date & time 01/06/15  1126 History   First MD Initiated Contact with Patient 01/06/15 1151     Chief Complaint  Patient presents with  . Hematuria  . Dysuria  . Abdominal Pain     (Consider location/radiation/quality/duration/timing/severity/associated sxs/prior Treatment) HPI Charles [name] is a 39 y.o. male with history of multiple myeloma currently on oral chemotherapy, comes in for evaluation of hematuria and abdominal pain. Patient reports he was seen in the emergency Department 2 weeks ago for similar complaints. Reports he was told by the cancer center he had a urinary infection, was given antibiotics. Reports that he was at [facility] today, receiving IV fluids and they told him to come to the ED because he was still urinating red. He reports that he feels well, denies any fevers, chills, nausea or vomiting, abdominal pain, back pain or dysuria. He does report that his urine is still a light red color.  Past Medical History  Diagnosis Date  . 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 CKD (chronic kidney disease), stage III   . Hypercholesterolemia   . Lytic bone lesions on xray    No past surgical history on file. Family History  Problem Relation Age of Onset  . Diabetes Father    Social History  Substance Use Topics  . Smoking status: Never Smoker   . Smokeless tobacco: Never Used  . Alcohol Use: No    Review of Systems A 10 point review of systems was completed and was negative except for pertinent positives and negatives as mentioned in the history of present illness     Allergies  Review of patient's allergies indicates no known allergies.  Home Medications   Prior to Admission medications   Medication Sig Start Date End Date Taking? Authorizing Provider  acyclovir (ZOVIRAX) 400 MG tablet Take 1 tablet (400 mg total) by mouth 2 (two) times daily. 05/01/14  Yes [name], MD  amoxicillin-clavulanate (AUGMENTIN) 875-125 MG tablet Take 1  tablet by mouth 2 (two) times daily. 12/26/14  Yes [name], NP  lenalidomide (REVLIMID) 10 MG capsule Take 1 capsule (10 mg total) by mouth daily. Patient taking differently: Take 10 mg by mouth daily. Take 1 daily for 21 days, then off for 7 days 12/09/14  Yes [name], MD  methylPREDNISolone (MEDROL DOSEPAK) 4 MG TBPK tablet Take as directed 01/01/15  Yes [name], NP  ondansetron (ZOFRAN) 8 MG tablet Take 1 tablet (8 mg total) by mouth every 8 (eight) hours as needed for nausea or vomiting. 12/26/14  Yes [name], NP  erythromycin ophthalmic ointment Place 1 application into the right eye 4 (four) times daily. Patient not taking: Reported on 12/28/2014 12/18/14   [name], NP  fluconazole (DIFLUCAN) 100 MG tablet Take 1 tablet (100 mg total) by mouth daily. Patient not taking: Reported on 12/28/2014 10/15/14   [name], MD  sulfamethoxazole-trimethoprim (BACTRIM DS,SEPTRA DS) 800-160 MG per tablet Take 1 tablet by mouth 2 (two) times daily. Patient not taking: Reported on 12/28/2014 10/15/14   [name], MD   BP 119/66 mmHg  Pulse 76  Temp(Src) 98.5 F (36.9 C) (Oral)  Resp 16  Ht _0  (1.6 m)  Wt 68.493 kg  BMI 26.76 kg/m2  SpO2 98% Physical Exam  Constitutional: He is oriented to person, place, and time. He appears well-developed and well-nourished.  Overall well-appearing Hispanic male  HENT:  Head: Normocephalic  and atraumatic.  Mouth/Throat: Oropharynx is clear and moist.  Eyes: Conjunctivae are normal. Pupils are equal, round, and reactive to light. Right eye exhibits no discharge. Left eye exhibits no discharge. No scleral icterus.  Neck: Neck supple.  Cardiovascular: Normal rate, regular rhythm and normal heart sounds.   Pulmonary/Chest: Effort normal and breath sounds normal. No respiratory distress. He has no wheezes. He has no rales.  Abdominal: Soft. There is no tenderness.  No CVA tenderness  Musculoskeletal: He  exhibits no tenderness.  Neurological: He is alert and oriented to person, place, and time.  Cranial Nerves II-XII grossly intact  Skin: Skin is warm and dry. No rash noted.  Psychiatric: He has a normal mood and affect.  Nursing note and vitals reviewed.   ED Course  Procedures (including critical care time) Labs Review Labs Reviewed  CBC WITH DIFFERENTIAL/PLATELET - Abnormal; Notable for the following:    RBC 3.04 (*)    Hemoglobin 9.7 (*)    HCT 30.4 (*)    Platelets 584 (*)    Monocytes Absolute 1.5 (*)    All other components within normal limits  COMPREHENSIVE METABOLIC PANEL - Abnormal; Notable for the following:    Glucose, Bld 102 (*)    Calcium 8.2 (*)    Total Protein 6.2 (*)    Albumin 2.2 (*)    AST 14 (*)    Total Bilirubin 0.1 (*)    All other components within normal limits  URINALYSIS, ROUTINE W REFLEX MICROSCOPIC (NOT AT [facility]) - Abnormal; Notable for the following:    Color, Urine RED (*)    APPearance CLOUDY (*)    Hgb urine dipstick LARGE (*)    Protein, ur 100 (*)    Leukocytes, UA TRACE (*)    All other components within normal limits  URINE MICROSCOPIC-ADD ON - Abnormal; Notable for the following:    Squamous Epithelial / LPF 0-5 (*)    Bacteria, UA FEW (*)    Casts HYALINE CASTS (*)    All other components within normal limits  URINE CULTURE  LIPASE, BLOOD    Imaging Review No results found. I have personally reviewed and evaluated these images and lab results as part of my medical decision-making.   EKG Interpretation None     Meds given in ED:  Medications - No data to display  Discharge Medication List as of 01/06/2015  3:13 PM     Filed Vitals:   01/06/15 1128 01/06/15 1339 01/06/15 1520  BP: 128/76 125/63 119/66  Pulse: 78 80 76  Temp: 97.9 F (36.6 C) 98.6 F (37 C) 98.5 F (36.9 C)  TempSrc: Oral Oral Oral  Resp: _0 Height: _1  (1.6 m)    Weight: 68.493 kg    SpO2: 99% 99% 98%    MDM  Charles [name]  is a 39 y.o. male history of multiple myeloma treated with oral chemotherapy. Here for evaluation of hematuria. On 11/25, patient was diagnosed with UTI, started on Augmentin. Since that time, Infection has resolved, but hematuria persists. Patient also had CT of his abdomen on 11/25 that showed 1.2 cm left renal cyst otherwise no acute intra-abdominal pathology. Follow-up ultrasound on 12/1 reveals same pathology. On arrival today, patient is hemodynamically stable, afebrile and overall appears well. He denies any discomfort in the ED, no abdominal pain or back pain. He does have gross hematuria. No clots. His hemoglobin has decreased from 11.9 to 9.7. No other headaches, dizziness, chest pain  or shortness of breath. Discussed with urology, Dr. Tammi Klippel, agrees this can be worked up on an outpatient basis. Patient given referral and instructed to follow-up with urology this week for reevaluation. They verbalize understanding and agree with this plan. Voice no other questions or concerns. Prior to discharge, I discussed and reviewed this case with my attending, Dr. Tomi Bamberger who agrees with above plan. Final diagnoses:  Hematuria        Comer Locket, PA-C 01/06/15 1707  Dorie Rank, MD 01/07/15 1025

## 2015-01-06 NOTE — Progress Notes (Addendum)
  CM discussed and provided written spanish translated information for uninsured accepting pcps, discussed the importance of pcp vs EDP services for f/u care, www.needymeds.org, www.goodrx.com, discounted pharmacies and other [employer] such as [employer], affordable care act, financial assistance, uninsured dental services, [place] med assist, DSS and  health department  Reviewed resources for [employer] uninsured accepting pcps like [facility], family medicine at [employer], [facility], [facility], local urgent care centers, [facility], [facility] family practice, general medical clinics, family services of the [place], [facility] urgent care plus others, medication resources, [facility] out patient pharmacies and housing Pt voiced understanding and appreciation of resources provided   Provided [facility] contact information Pt listed as seeing Dr Doreene Burke, Dr Jana Hakim and NP Berniece Salines at [place] [facility]   Entered in d/c instructions  Por favor, utilice los recursos proporcionados a usted en la sala de emergencia por Careers information officer de casos para ayudar con el mdico para el seguimiento [place], As needed Estos recursos no asegurados del condado de [place] proporcionan posibles proveedores de atencin primaria, recursos para medicinas con descuento, vivienda, recursos dentales, informacin accesible de atencin mdica, adems de otros recursos para el

## 2015-01-06 NOTE — ED Notes (Signed)
NURSE IN ROOM ATTEMPTING TO COLLECT LABS.

## 2015-01-06 NOTE — Assessment & Plan Note (Signed)
Patient presented to the [facility] last Friday 12/26/14 with increased fatigue.  He denied any recent fever; but does admit to being chilled recently.  He has also noted some night sweats recently as well.  He has had decreased appetite and lost several pounds within the past 5 days.  He is also complaining of some nausea and occasional vomiting as well.  He denies any diarrhea or constipation.  He does complain of some very mild dysuria; but no frequency or flank pain.  Urinalysis obtained at that time did reveal a mild urinary tract infection; and urine cultures were pending results.  Patient's CT obtained on 02/03/2014 revealed a 9 mm cortical lesion of the left kidney, which may be a hemorrhagic cyst or a complex cystic lesion.  Reviewed all findings with on call physician, Dr. Marin Olp- and was advised to prescribe Augmentin antibiotics for treatment of probable urinary tract infection.  Over this past weekend-urine culture returned with no growth; but blood cultures were obtained that same time revealed coag-negative Staphylococcus in one bottle only.  Patient was called and requested to return to the [facility] for further follow-up/recheck.  Patient states that he continues to feel very fatigued; with mild dysuria.  He continues with chills; and questionable fevers.  Patient obtained labs.  Once again, which revealed a WBC of 8.3, ANC 5.7, hemoglobin 11.2, platelet count 410.  Urinalysis obtained today revealed a large amount of hematuria, nitrate negative, trace leukocyte esterase, too numerous to count RBCs, WBCs 3-6, and a few bacteria.  Urine culture was once again ordered and pending results.  Also, obtained blood cultures 2, peripheral sticks.  Once again, and these results are pending.  Called and spoke to Dr. Johnnye Sima.  Infectious disease physician regarding the 1 coagulation negative staph.  A culture obtained over the weekend.  He advised we drawing blood cultures and continuing  on the Augmentin as previously prescribed.  Patient was advised to continue the Augmentin; and will follow back up with the patient on Thursday morning , 01/01/2015. _______________________________________________________    CT of the abdomen and pelvis obtained last week was essentially negative for any acute findings.  Renal ultrasound obtained today revealed: IMPRESSION: 1. Increased renal cortical echogenicity bilaterally as can be seen with medical renal disease. 2. No nephrolithiasis.  Chest x-ray obtained today was negative for acute findings as well.  Blood cultures obtained.  Again with results pending.  Urinalysis obtained once again today revealed a large amount of blood, nitrate negative, leukocyte esterase negative, RBCs too numerous to count, WBCs 21-50, and many bacteria.  Urine culture results pending.  Patient is almost finished with his Augmentin antibiotic as prescribed.  The plan is for the patient to obtain a urology referral for further evaluation and management as soon as possible.  In the meantime.-Patient was advised to call/return or go directly to the emergency department for any worsening symptoms whatsoever.

## 2015-01-06 NOTE — Assessment & Plan Note (Signed)
Patient complains of increasing nausea; but no vomiting.  He denies any diarrhea or constipation.  He does feel dehydrated today; and will be given IV fluid rehydration while at the cancer Center today.  He was also encouraged to push fluids at home is much as possible.

## 2015-01-06 NOTE — ED Notes (Signed)
Bed: OR56 Expected date:  Expected time:  Means of arrival:  Comments: Cancer center

## 2015-01-06 NOTE — Assessment & Plan Note (Signed)
Patient continues to take his Revlimid oral therapy as directed.  He last received Zometa infusion on 11/14/2014.  Labs obtained today reveal a WBC of 7.7, ANC 5.1, hemoglobin 11.9, platelet count 435.  Myeloma marker lab results pending.  Patient is scheduled to return 02/05/2015 for labs, visit, and his next Zometa infusion. ---------------------------------------------------------------------------------  Update: Patient presented to the [facility] today as a walk-in patient.  He complains of continued nausea, and only occasional vomiting.  He denies any diarrhea or constipation.  He continues to complain of some low-grade fevers and occasional chills.  He also has been noting some increased night sweats as well.  He also continues to complain of a vague discomfort to his lower abdominal pelvis region.  He also reports continued gross hematuria.  Of note-multiple myeloma markers obtained last week now resulted; and do appear elevated.  Reviewed all findings with Dr. Jana Hakim today; and he advised patient go directed to the emergency department for further evaluation and management today.  Dr. Jana Hakim recommends a urology consult for further evaluation as soon as possible.  Brief history report were called to the emergency department charge nurse; prior to the patient being transported to the emergency department for further evaluation and management via wheelchair.  Per the cancer Center nurse.

## 2015-01-06 NOTE — Progress Notes (Signed)
SYMPTOM MANAGEMENT CLINIC   HPI: Charles [name] 39 y.o. male diagnosed with multiple myeloma; with bone metastasis.  Currently undergoing Revlimid oral therapy and Zometa infusions.   Patient presented to the [facility] last Friday 12/26/14 with increased fatigue.  He denied any recent fever; but does admit to being chilled recently.  He has also noted some night sweats recently as well.  He has had decreased appetite and lost several pounds within the past 5 days.  He is also complaining of some nausea and occasional vomiting as well.  He denies any diarrhea or constipation.  He does complain of some very mild dysuria; but no frequency or flank pain.  Urinalysis obtained at that time did reveal a mild urinary tract infection; and urine cultures were pending results.  Patient's CT obtained on 02/03/2014 revealed a 9 mm cortical lesion of the left kidney, which may be a hemorrhagic cyst or a complex cystic lesion.  Reviewed all findings with on call physician, Dr. Marin Olp- and was advised to prescribe Augmentin antibiotics for treatment of probable urinary tract infection.  Over this past weekend-urine culture returned with no growth; but blood cultures were obtained that same time revealed coag-negative Staphylococcus in one bottle only.  Patient was called and requested to return to the [facility] for further follow-up/recheck.  Patient states that he continues to feel very fatigued; with mild dysuria.  He continues with chills; and questionable fevers.  Patient obtained labs.  Once again, which revealed a WBC of 8.3, ANC 5.7, hemoglobin 11.2, platelet count 410.  Urinalysis obtained today revealed a large amount of hematuria, nitrate negative, trace leukocyte esterase, too numerous to count RBCs, WBCs 3-6, and a few bacteria.  Urine culture was once again ordered and pending results.  Also, obtained blood cultures 2, peripheral sticks.  Once again, and these results are  pending.  Called and spoke to Dr. Johnnye Sima.  Infectious disease physician regarding the 1 coagulation negative staph.  A culture obtained over the weekend.  He advised we drawing blood cultures and continuing on the Augmentin as previously prescribed.  Patient was advised to continue the Augmentin; and will follow back up with the patient on Thursday morning , 01/01/2015. _______________________________________________________  Update:  Patient presented once again to the [facility] as a walk-in for recheck.  He continues to complain of increased fatigue, intermittent low-grade fevers and chills, and some night sweats.  He also continues to complain of some chronic nausea but no vomiting.  He denies any diarrhea or constipation.  He complains of a vague discomfort to his lower abdominal area.    CT of the abdomen and pelvis obtained last week was essentially negative for any acute findings.  Renal ultrasound obtained today revealed: IMPRESSION: 1. Increased renal cortical echogenicity bilaterally as can be seen with medical renal disease. 2. No nephrolithiasis.  Chest x-ray obtained today was negative for acute findings as well.  Blood cultures obtained.  Again with results pending.  Urinalysis obtained once again today revealed a large amount of blood, nitrate negative, leukocyte esterase negative, RBCs too numerous to count, WBCs 21-50, and many bacteria.  Urine culture results pending.  Patient is almost finished with his Augmentin antibiotic as prescribed.  The plan is for the patient to obtain a urology referral for further evaluation and management as soon as possible.  In the meantime.-Patient was advised to call/return or go directly to the emergency department for any worsening symptoms whatsoever.  HPI  ROS  Past  Medical History  Diagnosis Date  . 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 CKD (chronic kidney disease), stage III   . Hypercholesterolemia   . Lytic bone lesions on xray      History reviewed. No pertinent past surgical history.  has Lytic bone lesions on xray; Multiple myeloma (HCC); CKD (chronic kidney disease) stage 3, GFR 30-59 ml/min; Anemia in neoplastic disease; Hordeolum externum of right eye; UTI (urinary tract infection); Hypoalbuminemia due to protein-calorie malnutrition ([place]); and Dehydration on his problem list.    has No Known Allergies.    Medication List       This list is accurate as of: 01/01/15 11:59 PM.  Always use your most recent med list.               acyclovir 400 MG tablet  Commonly known as:  ZOVIRAX  Take 1 tablet (400 mg total) by mouth 2 (two) times daily.     amoxicillin-clavulanate 875-125 MG tablet  Commonly known as:  AUGMENTIN  Take 1 tablet by mouth 2 (two) times daily.     erythromycin ophthalmic ointment  Place 1 application into the right eye 4 (four) times daily.     fluconazole 100 MG tablet  Commonly known as:  DIFLUCAN  Take 1 tablet (100 mg total) by mouth daily.     lenalidomide 10 MG capsule  Commonly known as:  REVLIMID  Take 1 capsule (10 mg total) by mouth daily.     methylPREDNISolone 4 MG Tbpk tablet  Commonly known as:  MEDROL DOSEPAK  Take as directed     ondansetron 8 MG tablet  Commonly known as:  ZOFRAN  Take 1 tablet (8 mg total) by mouth every 8 (eight) hours as needed for nausea or vomiting.     sulfamethoxazole-trimethoprim 800-160 MG tablet  Commonly known as:  BACTRIM DS,SEPTRA DS  Take 1 tablet by mouth 2 (two) times daily.         PHYSICAL EXAMINATION  Oncology Vitals 01/06/2015 01/01/2015  Height - -  Weight - -  Weight (lbs) - -  BMI (kg/m2) - -  Temp 97.5 -  Pulse 80 97  Resp - 18  SpO2 100 100  BSA (m2) - -   BP Readings from Last 2 Encounters:  01/06/15 131/89  01/01/15 121/76    Physical Exam  Constitutional: He is oriented to person, place, and time. Vital signs are normal. He appears unhealthy.  HENT:  Head: Normocephalic and atraumatic.   Mouth/Throat: Oropharynx is clear and moist.  Eyes: Conjunctivae and EOM are normal. Pupils are equal, round, and reactive to light. Right eye exhibits no discharge. Left eye exhibits no discharge. No scleral icterus.  Neck: Normal range of motion. Neck supple. No JVD present. No tracheal deviation present. No thyromegaly present.  Cardiovascular: Normal rate, regular rhythm, normal heart sounds and intact distal pulses.   Pulmonary/Chest: Effort normal and breath sounds normal. No respiratory distress. He has no wheezes. He has no rales. He exhibits no tenderness.  Abdominal: Soft. Bowel sounds are normal. He exhibits no distension and no mass. There is no tenderness. There is no rebound and no guarding.  Genitourinary:  No flank pain.  Musculoskeletal: Normal range of motion. He exhibits no edema or tenderness.  Lymphadenopathy:    He has no cervical adenopathy.  Neurological: He is alert and oriented to person, place, and time. Gait normal.  Skin: Skin is warm and dry. No rash noted. No erythema. There is pallor.  Psychiatric: Affect normal.  Nursing note and vitals reviewed.   LABORATORY DATA:. Appointment on 01/01/2015  Component Date Value Ref Range Status  . Creatinine, Urine 01/01/2015 252  20 - 370 mg/dL Final  . Total Protein, Urine 01/01/2015 422* 5 - 25 mg/dL Final   Result repeated and verified.Result confirmed by automatic dilution.  . Protein Creatinine Ratio 01/01/2015 1675* 22 - 128 mg/g creat Final  . Kappa free light chain 01/01/2015 10.50* 0.33 - 1.94 mg/dL Final  . Lambda Free Lght Chn 01/01/2015 3.39* 0.57 - 2.63 mg/dL Final  . Kappa:Lambda Ratio 01/01/2015 3.10* 0.26 - 1.65 Final  . Total Protein, Serum Electrophores* 01/01/2015 7.5  6.1 - 8.1 g/dL Final  . Albumin ELP 01/01/2015 2.5* 3.8 - 4.8 g/dL Final  . Alpha-1-Globulin 01/01/2015 0.9* 0.2 - 0.3 g/dL Final  . Alpha-2-Globulin 01/01/2015 1.8* 0.5 - 0.9 g/dL Final  . Beta Globulin 01/01/2015 0.5  0.4 - 0.6  g/dL Final  . Beta 2 01/01/2015 0.4  0.2 - 0.5 g/dL Final  . Gamma Globulin 01/01/2015 1.6  0.8 - 1.7 g/dL Final  . Abnormal Protein Band1 01/01/2015 1.0   Final  . SPE Interp. 01/01/2015 *   Final   Comment: A restricted band consistent with monoclonal protein is present.The monoclonal protein peak accounts for 1.0 g/dL of the total1.6 g/dL of protein in the gamma region. The possibility of a faint restricted band(s) cannot be completelyexcluded in the  BETA-1 region.Suggest serum IFE if clinically indicated.                           (Lab will hold sample one week).Results are consistent with SPE performed on 12/15/2014 Reviewed by Luna Fuse C. Mammarappallil MD (Electronic Signature on File)   . COMMENT (PROTEIN ELECTROPHOR) 01/01/2015 *   Final   Comment: ---------------Serum protein electrophoresis is a useful screening procedure in thedetection of various pathophysiologic states such as inflammation,gammopathies, protein loss and other dysproteinemias.  Immunofixationelectrophoresis (IFE) is a more  sensitive technique for theidentification of M-proteins found in patients with monoclonalgammopathy of unknown significance (MGUS), amyloidosis, early ortreated myeloma or macroglobulinemia, solitary plasmacytoma orextramedullary plasmacytoma.   . Abnormal Protein Band2 01/01/2015 NOT DET   Final  . Abnormal Protein Band3 01/01/2015 NOT DET   Final  . WBC 01/01/2015 7.7  4.0 - 10.3 10e3/uL Final  . NEUT# 01/01/2015 5.1  1.5 - 6.5 10e3/uL Final  . HGB 01/01/2015 11.9* 13.0 - 17.1 g/dL Final  . HCT 01/01/2015 35.6* 38.4 - 49.9 % Final  . Platelets 01/01/2015 435* 140 - 400 10e3/uL Final  . MCV 01/01/2015 97.0  79.3 - 98.0 fL Final  . MCH 01/01/2015 32.3  27.2 - 33.4 pg Final  . MCHC 01/01/2015 33.3  32.0 - 36.0 g/dL Final  . RBC 01/01/2015 3.67* 4.20 - 5.82 10e6/uL Final  . RDW 01/01/2015 14.4  11.0 - 14.6 % Final  . lymph# 01/01/2015 1.7  0.9 - 3.3 10e3/uL Final  . MONO# 01/01/2015 0.7  0.1 -  0.9 10e3/uL Final  . Eosinophils Absolute 01/01/2015 0.2  0.0 - 0.5 10e3/uL Final  . Basophils Absolute 01/01/2015 0.0  0.0 - 0.1 10e3/uL Final  . NEUT% 01/01/2015 66.1  39.0 - 75.0 % Final  . LYMPH% 01/01/2015 21.7  14.0 - 49.0 % Final  . MONO% 01/01/2015 9.3  0.0 - 14.0 % Final  . EOS% 01/01/2015 2.3  0.0 - 7.0 % Final  . BASO% 01/01/2015 0.6  0.0 -  2.0 % Final  . Urine Culture, Routine 01/01/2015 Culture, Urine   Final   Comment: Final - ===== COLONY COUNT: ===== NO GROWTH NO GROWTH   . Glucose 01/01/2015 Negative  Negative mg/dL Final  . Bilirubin (Urine) 01/01/2015 Negative  Negative Final  . Ketones 01/01/2015 Negative  Negative mg/dL Final  . Specific Gravity, Urine 01/01/2015 1.030  1.003 - 1.035 Final  . Blood 01/01/2015 Large  Negative Final  . pH 01/01/2015 6.0  4.6 - 8.0 Final  . Protein 01/01/2015 300  Negative- <30 mg/dL Final  . Urobilinogen, UR 01/01/2015 0.2  0.2 - 1 mg/dL Final  . Nitrite 01/01/2015 Negative  Negative Final  . Leukocyte Esterase 01/01/2015 Negative  Negative Final  . RBC / HPF 01/01/2015 TNTC  0 - 2 Final  . WBC, UA 01/01/2015 21-50  0 - 2 Final  . Bacteria, UA 01/01/2015 Many  Negative- Trace Final  . Epithelial Cells 01/01/2015 Few  Negative- Few Final  . Yeast, UA 01/01/2015 Few  Negative Final  . Sodium 01/01/2015 138  136 - 145 mEq/L Final  . Potassium 01/01/2015 3.5  3.5 - 5.1 mEq/L Final  . Chloride 01/01/2015 101  98 - 109 mEq/L Final  . CO2 01/01/2015 27  22 - 29 mEq/L Final  . Glucose 01/01/2015 119  70 - 140 mg/dl Final   Glucose reference range is for nonfasting patients. Fasting glucose reference range is 70- 100.  Marland Kitchen BUN 01/01/2015 13.0  7.0 - 26.0 mg/dL Final  . Creatinine 01/01/2015 1.4* 0.7 - 1.3 mg/dL Final  . Total Bilirubin 01/01/2015 <0.30  0.20 - 1.20 mg/dL Final  . Alkaline Phosphatase 01/01/2015 129  40 - 150 U/L Final  . AST 01/01/2015 22  5 - 34 U/L Final  . ALT 01/01/2015 24  0 - 55 U/L Final  . Total Protein  01/01/2015 8.4* 6.4 - 8.3 g/dL Final  . Albumin 01/01/2015 2.3* 3.5 - 5.0 g/dL Final  . Calcium 01/01/2015 10.2  8.4 - 10.4 mg/dL Final  . Anion Gap 01/01/2015 9  3 - 11 mEq/L Final  . EGFR 01/01/2015 62* >90 ml/min/1.73 m2 Final   eGFR is calculated using the CKD-EPI Creatinine Equation (2009)     RADIOGRAPHIC STUDIES: No results found.  ASSESSMENT/PLAN:    Hordeolum externum of right eye Patient with right eye hordeolum slowly resolving.  Patient continues to use the antibiotic eyedrops he was prescribed last week.  There is much less erythema and edema to the site now.  Patient was advised to continue with warm, moist compresses to the site.  As directed.  He was also encouraged to follow-up with the ophthalmologist if symptoms persist or worsen.      Multiple myeloma ([place]) Patient continues to take his Revlimid oral therapy as directed.  He last received Zometa infusion on 11/14/2014.  Labs obtained today reveal a WBC of 7.7, ANC 5.1, hemoglobin 11.9, platelet count 435.  Myeloma marker lab results pending.  Patient is scheduled to return 02/05/2015 for labs, visit, and his next Zometa infusion.        UTI (urinary tract infection) Patient presented to the [facility] last Friday 12/26/14 with increased fatigue.  He denied any recent fever; but does admit to being chilled recently.  He has also noted some night sweats recently as well.  He has had decreased appetite and lost several pounds within the past 5 days.  He is also complaining of some nausea and occasional vomiting as well.  He denies any diarrhea or constipation.  He does complain of some very mild dysuria; but no frequency or flank pain.  Urinalysis obtained at that time did reveal a mild urinary tract infection; and urine cultures were pending results.  Patient's CT obtained on 02/03/2014 revealed a 9 mm cortical lesion of the left kidney, which may be a hemorrhagic cyst or a complex cystic lesion.  Reviewed  all findings with on call physician, Dr. Marin Olp- and was advised to prescribe Augmentin antibiotics for treatment of probable urinary tract infection.  Over this past weekend-urine culture returned with no growth; but blood cultures were obtained that same time revealed coag-negative Staphylococcus in one bottle only.  Patient was called and requested to return to the [facility] for further follow-up/recheck.  Patient states that he continues to feel very fatigued; with mild dysuria.  He continues with chills; and questionable fevers.  Patient obtained labs.  Once again, which revealed a WBC of 8.3, ANC 5.7, hemoglobin 11.2, platelet count 410.  Urinalysis obtained today revealed a large amount of hematuria, nitrate negative, trace leukocyte esterase, too numerous to count RBCs, WBCs 3-6, and a few bacteria.  Urine culture was once again ordered and pending results.  Also, obtained blood cultures 2, peripheral sticks.  Once again, and these results are pending.  Called and spoke to Dr. Johnnye Sima.  Infectious disease physician regarding the 1 coagulation negative staph.  A culture obtained over the weekend.  He advised we drawing blood cultures and continuing on the Augmentin as previously prescribed.  Patient was advised to continue the Augmentin; and will follow back up with the patient on Thursday morning , 01/01/2015. _______________________________________________________  Update:  Patient presented once again to the [facility] as a walk-in for recheck.  He continues to complain of increased fatigue, intermittent low-grade fevers and chills, and some night sweats.  He also continues to complain of some chronic nausea but no vomiting.  He denies any diarrhea or constipation.  He complains of a vague discomfort to his lower abdominal area.    CT of the abdomen and pelvis obtained last week was essentially negative for any acute findings.  Renal ultrasound obtained today revealed:  IMPRESSION: 1. Increased renal cortical echogenicity bilaterally as can be seen with medical renal disease. 2. No nephrolithiasis.  Chest x-ray obtained today was negative for acute findings as well.  Blood cultures obtained.  Again with results pending.  Urinalysis obtained once again today revealed a large amount of blood, nitrate negative, leukocyte esterase negative, RBCs too numerous to count, WBCs 21-50, and many bacteria.  Urine culture results pending.  Patient is almost finished with his Augmentin antibiotic as prescribed.  The plan is for the patient to obtain a urology referral for further evaluation and management as soon as possible.  In the meantime.-Patient was advised to call/return or go directly to the emergency department for any worsening symptoms whatsoever.    Dehydration Patient complains of increasing nausea; but no vomiting.  He denies any diarrhea or constipation.  He does feel dehydrated today; and will be given IV fluid rehydration while at the cancer Center today.  He was also encouraged to push fluids at home is much as possible.   Translator with pt during exam.   Patient stated understanding of all instructions; and was in agreement with this plan of care. The patient knows to call the clinic with any problems, questions or concerns.   Review/collaboration with Dr. Jana Hakim regarding all aspects of patient's  visit today.   Total time spent with patient was 40 minutes;  with greater than 75 percent of that time spent in face to face counseling regarding patient's symptoms,  and coordination of care and follow up.  Disclaimer:This dictation was prepared with Dragon/digital dictation along with [employer]. Any transcriptional errors that result from this process are unintentional.  Drue Second, NP 01/06/2015

## 2015-01-06 NOTE — Assessment & Plan Note (Signed)
Patient presented to the [facility] last Friday 12/26/14 with increased fatigue.  He denied any recent fever; but does admit to being chilled recently.  He has also noted some night sweats recently as well.  He has had decreased appetite and lost several pounds within the past 5 days.  He is also complaining of some nausea and occasional vomiting as well.  He denies any diarrhea or constipation.  He does complain of some very mild dysuria; but no frequency or flank pain.  Urinalysis obtained at that time did reveal a mild urinary tract infection; and urine cultures were pending results.  Patient's CT obtained on 02/03/2014 revealed a 9 mm cortical lesion of the left kidney, which may be a hemorrhagic cyst or a complex cystic lesion.  Reviewed all findings with on call physician, Dr. Marin Olp- and was advised to prescribe Augmentin antibiotics for treatment of probable urinary tract infection.  Over this past weekend-urine culture returned with no growth; but blood cultures were obtained that same time revealed coag-negative Staphylococcus in one bottle only.  Patient was called and requested to return to the [facility] for further follow-up/recheck.  Patient states that he continues to feel very fatigued; with mild dysuria.  He continues with chills; and questionable fevers.  Patient obtained labs.  Once again, which revealed a WBC of 8.3, ANC 5.7, hemoglobin 11.2, platelet count 410.  Urinalysis obtained today revealed a large amount of hematuria, nitrate negative, trace leukocyte esterase, too numerous to count RBCs, WBCs 3-6, and a few bacteria.  Urine culture was once again ordered and pending results.  Also, obtained blood cultures 2, peripheral sticks.  Once again, and these results are pending.  Called and spoke to Dr. Johnnye Sima.  Infectious disease physician regarding the 1 coagulation negative staph.  A culture obtained over the weekend.  He advised we drawing blood cultures and continuing  on the Augmentin as previously prescribed.  Patient was advised to continue the Augmentin; and will follow back up with the patient on Thursday morning , 01/01/2015. _______________________________________________________  Update:  Patient presented once again to the [facility] as a walk-in for recheck.  He continues to complain of increased fatigue, intermittent low-grade fevers and chills, and some night sweats.  He also continues to complain of some chronic nausea but no vomiting.  He denies any diarrhea or constipation.  He complains of a vague discomfort to his lower abdominal area.    CT of the abdomen and pelvis obtained last week was essentially negative for any acute findings.  Renal ultrasound obtained today revealed: IMPRESSION: 1. Increased renal cortical echogenicity bilaterally as can be seen with medical renal disease. 2. No nephrolithiasis.  Chest x-ray obtained today was negative for acute findings as well.  Blood cultures obtained.  Again with results pending.  Urinalysis obtained once again today revealed a large amount of blood, nitrate negative, leukocyte esterase negative, RBCs too numerous to count, WBCs 21-50, and many bacteria.  Urine culture results pending.  Patient is almost finished with his Augmentin antibiotic as prescribed.  The plan is for the patient to obtain a urology referral for further evaluation and management as soon as possible.  In the meantime.-Patient was advised to call/return or go directly to the emergency department for any worsening symptoms whatsoever.

## 2015-01-06 NOTE — Assessment & Plan Note (Signed)
Patient with right eye hordeolum slowly resolving.  Patient continues to use the antibiotic eyedrops he was prescribed last week.  There is much less erythema and edema to the site now.  Patient was advised to continue with warm, moist compresses to the site.  As directed.  He was also encouraged to follow-up with the ophthalmologist if symptoms persist or worsen.

## 2015-01-06 NOTE — Assessment & Plan Note (Signed)
Patient continues to take his Revlimid oral therapy as directed.  He last received Zometa infusion on 11/14/2014.  Labs obtained today reveal a WBC of 7.7, ANC 5.1, hemoglobin 11.9, platelet count 435.  Myeloma marker lab results pending.  Patient is scheduled to return 02/05/2015 for labs, visit, and his next Zometa infusion.

## 2015-01-06 NOTE — Patient Instructions (Signed)

## 2015-01-06 NOTE — Progress Notes (Signed)
Pt to be transferred to the ED for evaluation of ongoing hematuria per Selena Lesser, NP and Dr. Jana Hakim.  Selena Lesser, NP explained situation to pt and he voiced understanding. Peripheral IV intaxct at time of transfer.

## 2015-01-06 NOTE — ED Notes (Addendum)
Patient came from the [facility] with interpreter. Patient has had fever, chills, dysuria, and hematuria x 3 weeks. patient also c/o right lower abdominal pain.

## 2015-01-07 ENCOUNTER — Other Ambulatory Visit: Payer: Self-pay | Admitting: Oncology

## 2015-01-08 ENCOUNTER — Telehealth: Payer: Self-pay | Admitting: *Deleted

## 2015-01-08 ENCOUNTER — Other Ambulatory Visit: Payer: Self-pay

## 2015-01-08 DIAGNOSIS — C9000 Multiple myeloma not having achieved remission: Secondary | ICD-10-CM

## 2015-01-08 LAB — URINE CULTURE

## 2015-01-08 MED ORDER — LENALIDOMIDE 10 MG PO CAPS: 10.0000 mg | ORAL_CAPSULE | Freq: Every day | ORAL | Status: DC

## 2015-01-08 NOTE — Telephone Encounter (Signed)
Follow up call to [facility] to ensure pt has urgent appt following ED visit. Pt is scheduled for Friday 12/9 at 2:45pm. Advised by scheduler that pt did not request an interpreter. Pt language is primary spanish. Called to request Gregary Signs, Interpreter to accompany pt to appt. Gregary Signs states she will also call to ensure pt is aware of appt.

## 2015-01-14 ENCOUNTER — Encounter: Payer: Self-pay | Admitting: Pharmacist

## 2015-01-14 NOTE — Progress Notes (Signed)
12/13 - [employer] confirmed shipment of Revlimid. Shipment sent out for delivery on 12/13. Patient should receive by 12/14

## 2015-01-20 ENCOUNTER — Ambulatory Visit (HOSPITAL_BASED_OUTPATIENT_CLINIC_OR_DEPARTMENT_OTHER): Payer: Self-pay

## 2015-01-20 ENCOUNTER — Ambulatory Visit (HOSPITAL_BASED_OUTPATIENT_CLINIC_OR_DEPARTMENT_OTHER): Payer: Self-pay | Admitting: Nurse Practitioner

## 2015-01-20 ENCOUNTER — Ambulatory Visit (HOSPITAL_COMMUNITY)
Admission: RE | Admit: 2015-01-20 | Discharge: 2015-01-20 | Disposition: A | Discharge status: Home / Self Care | Payer: Self-pay | Source: Ambulatory Visit | Attending: Nurse Practitioner | Admitting: Nurse Practitioner

## 2015-01-20 ENCOUNTER — Other Ambulatory Visit: Payer: Self-pay

## 2015-01-20 VITALS — BP 125/68 | HR 90 | Temp 97.9°F | Resp 18 | Ht 63.0 in | Wt 150.9 lb

## 2015-01-20 VITALS — BP 116/70 | HR 86 | Temp 98.5°F | Resp 18

## 2015-01-20 DIAGNOSIS — T451X5A Adverse effect of antineoplastic and immunosuppressive drugs, initial encounter: Secondary | ICD-10-CM

## 2015-01-20 DIAGNOSIS — G893 Neoplasm related pain (acute) (chronic): Secondary | ICD-10-CM

## 2015-01-20 DIAGNOSIS — E8809 Other disorders of plasma-protein metabolism, not elsewhere classified: Secondary | ICD-10-CM

## 2015-01-20 DIAGNOSIS — N289 Disorder of kidney and ureter, unspecified: Secondary | ICD-10-CM

## 2015-01-20 DIAGNOSIS — E46 Unspecified protein-calorie malnutrition: Secondary | ICD-10-CM

## 2015-01-20 DIAGNOSIS — N183 Chronic kidney disease, stage 3 unspecified: Secondary | ICD-10-CM

## 2015-01-20 DIAGNOSIS — E860 Dehydration: Secondary | ICD-10-CM

## 2015-01-20 DIAGNOSIS — E876 Hypokalemia: Secondary | ICD-10-CM

## 2015-01-20 DIAGNOSIS — D630 Anemia in neoplastic disease: Secondary | ICD-10-CM

## 2015-01-20 DIAGNOSIS — M899 Disorder of bone, unspecified: Secondary | ICD-10-CM

## 2015-01-20 DIAGNOSIS — C9000 Multiple myeloma not having achieved remission: Secondary | ICD-10-CM

## 2015-01-20 DIAGNOSIS — N419 Inflammatory disease of prostate, unspecified: Secondary | ICD-10-CM

## 2015-01-20 DIAGNOSIS — R112 Nausea with vomiting, unspecified: Secondary | ICD-10-CM

## 2015-01-20 DIAGNOSIS — D6481 Anemia due to antineoplastic chemotherapy: Secondary | ICD-10-CM

## 2015-01-20 DIAGNOSIS — R079 Chest pain, unspecified: Secondary | ICD-10-CM | POA: Insufficient documentation

## 2015-01-20 DIAGNOSIS — R21 Rash and other nonspecific skin eruption: Secondary | ICD-10-CM

## 2015-01-20 LAB — COMPREHENSIVE METABOLIC PANEL
ALBUMIN: 2 g/dL — AB (ref 3.5–5.0)
ALK PHOS: 150 U/L (ref 40–150)
ALT: 22 U/L (ref 0–55)
ANION GAP: 11 meq/L (ref 3–11)
AST: 22 U/L (ref 5–34)
BILIRUBIN TOTAL: 0.42 mg/dL (ref 0.20–1.20)
BUN: 21.9 mg/dL (ref 7.0–26.0)
CALCIUM: 9.5 mg/dL (ref 8.4–10.4)
CO2: 24 mEq/L (ref 22–29)
Chloride: 103 mEq/L (ref 98–109)
Creatinine: 3.1 mg/dL (ref 0.7–1.3)
EGFR: 24 mL/min/{1.73_m2} — AB (ref >90)
GLUCOSE: 99 mg/dL (ref 70–140)
POTASSIUM: 3.4 meq/L — AB (ref 3.5–5.1)
Sodium: 138 mEq/L (ref 136–145)
TOTAL PROTEIN: 7.9 g/dL (ref 6.4–8.3)

## 2015-01-20 LAB — CBC WITH DIFFERENTIAL/PLATELET
BASO%: 0.4 % (ref 0.0–2.0)
BASOS ABS: 0 10*3/uL (ref 0.0–0.1)
EOS ABS: 0.2 10*3/uL (ref 0.0–0.5)
EOS%: 2.6 % (ref 0.0–7.0)
HEMATOCRIT: 27.3 % — AB (ref 38.4–49.9)
HEMOGLOBIN: 8.9 g/dL — AB (ref 13.0–17.1)
LYMPH%: 20.5 % (ref 14.0–49.0)
MCH: 31.6 pg (ref 27.2–33.4)
MCHC: 32.6 g/dL (ref 32.0–36.0)
MCV: 96.8 fL (ref 79.3–98.0)
MONO#: 0.4 10*3/uL (ref 0.1–0.9)
MONO%: 6.1 % (ref 0.0–14.0)
NEUT#: 5.1 10*3/uL (ref 1.5–6.5)
NEUT%: 70.4 % (ref 39.0–75.0)
NRBC: 0 % (ref 0–0)
PLATELETS: 333 10*3/uL (ref 140–400)
RBC: 2.82 10*6/uL — ABNORMAL LOW (ref 4.20–5.82)
RDW: 14.9 % — AB (ref 11.0–14.6)
WBC: 7.2 10*3/uL (ref 4.0–10.3)
lymph#: 1.5 10*3/uL (ref 0.9–3.3)

## 2015-01-20 LAB — PROTEIN / CREATININE RATIO, URINE
CREATININE, URINE: 205 mg/dL (ref 20–370)
PROTEIN CREATININE RATIO: 3268 mg/g{creat} — AB (ref 22–128)
TOTAL PROTEIN, URINE: 670 mg/dL — AB (ref 5–25)

## 2015-01-20 MED ORDER — SODIUM CHLORIDE 0.9 % IV SOLN
INTRAVENOUS | Status: AC
  Administered 2015-01-20: 12:00:00 via INTRAVENOUS

## 2015-01-20 MED ORDER — METHYLPREDNISOLONE 4 MG PO TBPK: ORAL_TABLET | ORAL | Status: DC

## 2015-01-20 MED ORDER — MORPHINE SULFATE 4 MG/ML IJ SOLN
2.0000 mg | Freq: Once | INTRAMUSCULAR | Status: AC
  Administered 2015-01-20: 2 mg via INTRAVENOUS
  Filled 2015-01-20: qty 1

## 2015-01-20 MED ORDER — NITROFURANTOIN MONOHYD MACRO 100 MG PO CAPS: 100.0000 mg | ORAL_CAPSULE | Freq: Two times a day (BID) | ORAL | Status: DC

## 2015-01-20 MED ORDER — MORPHINE SULFATE (PF) 4 MG/ML IV SOLN
INTRAVENOUS | Status: AC
  Filled 2015-01-20: qty 1

## 2015-01-20 MED ORDER — TRAMADOL HCL 50 MG PO TABS: 50.0000 mg | ORAL_TABLET | Freq: Four times a day (QID) | ORAL | Status: DC | PRN

## 2015-01-20 MED ORDER — SODIUM CHLORIDE 0.9 % IV SOLN
Freq: Once | INTRAVENOUS | Status: AC
  Administered 2015-01-20: 12:00:00 via INTRAVENOUS
  Filled 2015-01-20: qty 4

## 2015-01-20 NOTE — Patient Instructions (Signed)

## 2015-01-20 NOTE — Progress Notes (Signed)
Patient has a critical lab today- creatinine is 3.1   Selena Lesser, NP aware.

## 2015-01-21 ENCOUNTER — Telehealth: Payer: Self-pay | Admitting: Oncology

## 2015-01-21 ENCOUNTER — Encounter: Payer: Self-pay | Admitting: Nurse Practitioner

## 2015-01-21 ENCOUNTER — Other Ambulatory Visit: Payer: Self-pay | Admitting: Nurse Practitioner

## 2015-01-21 DIAGNOSIS — E876 Hypokalemia: Secondary | ICD-10-CM | POA: Insufficient documentation

## 2015-01-21 DIAGNOSIS — T451X5A Adverse effect of antineoplastic and immunosuppressive drugs, initial encounter: Secondary | ICD-10-CM

## 2015-01-21 DIAGNOSIS — D6481 Anemia due to antineoplastic chemotherapy: Secondary | ICD-10-CM | POA: Insufficient documentation

## 2015-01-21 DIAGNOSIS — R21 Rash and other nonspecific skin eruption: Secondary | ICD-10-CM | POA: Insufficient documentation

## 2015-01-21 DIAGNOSIS — G893 Neoplasm related pain (acute) (chronic): Secondary | ICD-10-CM | POA: Insufficient documentation

## 2015-01-21 DIAGNOSIS — R112 Nausea with vomiting, unspecified: Secondary | ICD-10-CM | POA: Insufficient documentation

## 2015-01-21 DIAGNOSIS — C9000 Multiple myeloma not having achieved remission: Secondary | ICD-10-CM

## 2015-01-21 NOTE — Assessment & Plan Note (Signed)
Albumen continues decreased at 2.0.  Patient was encouraged push protein in his diet is much as possible.

## 2015-01-21 NOTE — Assessment & Plan Note (Signed)
Patient has had chronic mild dysuria and gross hematuria for the past several weeks.  Patient has been treated with antibiotics on multiple occasions with no improvement.  Patient was recently sent to the emergency department for further evaluation and management of the same issue.  He was then seen by Dr. Valora Corporal urologist on 01/09/2015.  He was apparently diagnosed with chronic prostatitis; and given antibiotics for treatment.  Patient continues to complain of some mild dysuria; and gross hematuria.  He denies any recent fevers or chills.  See further notes regarding apparent allergic reaction to Cipro antibiotics given to the patient for treatment of his prostatitis.  In regards to patient's prostatitis diagnosis-patient was encouraged to follow back up with his urologist as directed.

## 2015-01-21 NOTE — Assessment & Plan Note (Signed)
Patient continues to take his Revlimid oral therapy as directed.  He last received Zometa infusion on 11/14/2014.  Patient has plans to return to the [facility] this Thursday, 01/22/2015 for labs and a follow-up visit.  Patient is scheduled to return 02/05/2015 for labs, visit, and his next Zometa infusion. ---------------------------------------------------------------------------------

## 2015-01-21 NOTE — Assessment & Plan Note (Signed)
Potassium down to 3.4 today.  Most likely, hypokalemia secondary to dehydration.  Patient was encouraged to push potassium in his diet is much as possible.  Will continue to monitor closely.

## 2015-01-21 NOTE — Assessment & Plan Note (Signed)
Patient states that he has had some chronic nausea; and vomited 3 times yesterday.  He feels dehydrated today.  Confirmed the patient does have anti-nausea medications already at home to take as directed.  Patient was given IV fluid rehydration while at the [facility] today; was also given Zofran IV.

## 2015-01-21 NOTE — Progress Notes (Signed)
SYMPTOM MANAGEMENT CLINIC   HPI: Charles [name] 39 y.o. male diagnosed with multiple myeloma; with bone metastasis.  Currently undergoing Revlimid oral therapy and Zometa infusions.  Patient was seen by urologist, Dr. Vikki Ports on 01/09/2015; and diagnosed with apparent prostatitis.  Patient was prescribed Cipro and Mobic.  Patient states that he was instructed to take the Cipro antibiotics for a total of 30 days.  Patient developed a rash after taking the antibiotics for approximately one week.  He states the rash is only occasionally pruritic.  Exam reveals rash/hives to entire anterior neck with some radiation of the rash to the areas behind his ears.  He also has the same rash to his bilateral forearms as well.  Patient has no other allergic-type symptoms whatsoever.  Patient is also reporting a new onset of right lateral rib pain for the past several days.  He denies any chest pain, chest pressure, shortness breath, or pain with inspiration.  He denies any recent fevers or chills.   Urinary Tract Infection   Rash  Abdominal Pain    Review of Systems  Gastrointestinal: Positive for abdominal pain.  Skin: Positive for rash.    Past Medical History  Diagnosis Date  . 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 CKD (chronic kidney disease), stage III   . Hypercholesterolemia   . Lytic bone lesions on xray     History reviewed. No pertinent past surgical history.  has Lytic bone lesions on xray; Multiple myeloma (HCC); CKD (chronic kidney disease) stage 3, GFR 30-59 ml/min; Hordeolum externum of right eye; Prostatitis; Hypoalbuminemia due to protein-calorie malnutrition ([place]); Dehydration; Rash; Cancer associated pain; Nausea with vomiting; Antineoplastic chemotherapy induced anemia; and Hypokalemia on his problem list.    is allergic to ciprofloxacin and mobic.    Medication List       This list is accurate as of: 01/20/15 11:59 PM.  Always use your most recent med list.               acyclovir 400 MG tablet  Commonly known as:  ZOVIRAX  TAKE 1 TABLET BY MOUTH 5 TIMES A DAY     erythromycin ophthalmic ointment  Place 1 application into the right eye 4 (four) times daily.     fluconazole 100 MG tablet  Commonly known as:  DIFLUCAN  Take 1 tablet (100 mg total) by mouth daily.     lenalidomide 10 MG capsule  Commonly known as:  REVLIMID  Take 1 capsule (10 mg total) by mouth daily. Take 1 daily for 21 days, then off for 7 days     methylPREDNISolone 4 MG Tbpk tablet  Commonly known as:  MEDROL DOSEPAK  Medrol dose pak: take as directed.     nitrofurantoin (macrocrystal-monohydrate) 100 MG capsule  Commonly known as:  MACROBID  Take 1 capsule (100 mg total) by mouth 2 (two) times daily.     ondansetron 8 MG tablet  Commonly known as:  ZOFRAN  Take 1 tablet (8 mg total) by mouth every 8 (eight) hours as needed for nausea or vomiting.     traMADol 50 MG tablet  Commonly known as:  ULTRAM  Take 1 tablet (50 mg total) by mouth every 6 (six) hours as needed.         PHYSICAL EXAMINATION  Oncology Vitals 01/20/2015 01/20/2015  Height - 160 cm  Weight - 68.448 kg  Weight (lbs) - 150 lbs 14 oz  BMI (kg/m2) - 26.73 kg/m2  Temp 98.5 97.9  Pulse 86 90  Resp 18 18  SpO2 100 100  BSA (m2) - 1.74 m2   BP Readings from Last 2 Encounters:  01/20/15 116/70  01/20/15 125/68    Physical Exam  Constitutional: He is oriented to person, place, and time. Vital signs are normal. He appears unhealthy.  HENT:  Head: Normocephalic and atraumatic.  Mouth/Throat: Oropharynx is clear and moist.  Eyes: Conjunctivae and EOM are normal. Pupils are equal, round, and reactive to light. Right eye exhibits no discharge. Left eye exhibits no discharge. No scleral icterus.  Neck: Normal range of motion. Neck supple. No JVD present. No tracheal deviation present. No thyromegaly present.  Cardiovascular: Normal rate, regular rhythm, normal heart sounds and intact distal  pulses.   Pulmonary/Chest: Effort normal and breath sounds normal. No respiratory distress. He has no wheezes. He has no rales. He exhibits tenderness.  Right lateral rib tenderness with any palpation.  No obvious injury or trauma to the site.  Abdominal: Soft. Bowel sounds are normal. He exhibits no distension and no mass. There is no tenderness. There is no rebound and no guarding.  Genitourinary:  No flank pain.  Musculoskeletal: Normal range of motion. He exhibits no edema or tenderness.  Lymphadenopathy:    He has no cervical adenopathy.  Neurological: He is alert and oriented to person, place, and time. Gait normal.  Skin: Skin is warm and dry. Rash noted. No erythema. There is pallor.  Patient has rash/hives to his anterior neck region; some radiation of the rash to the areas directly behind his years as well.  Also has the same rash to his bilateral forearms.  Psychiatric: Affect normal.  Nursing note and vitals reviewed.  you should follow along with a will  LABORATORY DATA:. Appointment on 01/20/2015  Component Date Value Ref Range Status  . WBC 01/20/2015 7.2  4.0 - 10.3 10e3/uL Final  . NEUT# 01/20/2015 5.1  1.5 - 6.5 10e3/uL Final  . HGB 01/20/2015 8.9* 13.0 - 17.1 g/dL Final  . HCT 01/20/2015 27.3* 38.4 - 49.9 % Final  . Platelets 01/20/2015 333  140 - 400 10e3/uL Final  . MCV 01/20/2015 96.8  79.3 - 98.0 fL Final  . MCH 01/20/2015 31.6  27.2 - 33.4 pg Final  . MCHC 01/20/2015 32.6  32.0 - 36.0 g/dL Final  . RBC 01/20/2015 2.82* 4.20 - 5.82 10e6/uL Final  . RDW 01/20/2015 14.9* 11.0 - 14.6 % Final  . lymph# 01/20/2015 1.5  0.9 - 3.3 10e3/uL Final  . MONO# 01/20/2015 0.4  0.1 - 0.9 10e3/uL Final  . Eosinophils Absolute 01/20/2015 0.2  0.0 - 0.5 10e3/uL Final  . Basophils Absolute 01/20/2015 0.0  0.0 - 0.1 10e3/uL Final  . NEUT% 01/20/2015 70.4  39.0 - 75.0 % Final  . LYMPH% 01/20/2015 20.5  14.0 - 49.0 % Final  . MONO% 01/20/2015 6.1  0.0 - 14.0 % Final  . EOS%  01/20/2015 2.6  0.0 - 7.0 % Final  . BASO% 01/20/2015 0.4  0.0 - 2.0 % Final  . nRBC 01/20/2015 0  0 - 0 % Final  . Sodium 01/20/2015 138  136 - 145 mEq/L Final  . Potassium 01/20/2015 3.4* 3.5 - 5.1 mEq/L Final  . Chloride 01/20/2015 103  98 - 109 mEq/L Final  . CO2 01/20/2015 24  22 - 29 mEq/L Final  . Glucose 01/20/2015 99  70 - 140 mg/dl Final   Glucose reference range is for nonfasting patients. Fasting glucose reference range is 70-  100.  Marland Kitchen BUN 01/20/2015 21.9  7.0 - 26.0 mg/dL Final  . Creatinine 01/20/2015 3.1* 0.7 - 1.3 mg/dL Final  . Total Bilirubin 01/20/2015 0.42  0.20 - 1.20 mg/dL Final  . Alkaline Phosphatase 01/20/2015 150  40 - 150 U/L Final  . AST 01/20/2015 22  5 - 34 U/L Final  . ALT 01/20/2015 22  0 - 55 U/L Final  . Total Protein 01/20/2015 7.9  6.4 - 8.3 g/dL Final  . Albumin 01/20/2015 2.0* 3.5 - 5.0 g/dL Final  . Calcium 01/20/2015 9.5  8.4 - 10.4 mg/dL Final  . Anion Gap 01/20/2015 11  3 - 11 mEq/L Final  . EGFR 01/20/2015 24* >90 ml/min/1.73 m2 Final   eGFR is calculated using the CKD-EPI Creatinine Equation (2009)  . IgG (Immunoglobin G), Serum 01/20/2015 1680* 650 - 1600 mg/dL Preliminary  . IgA 01/20/2015 164  68 - 379 mg/dL Preliminary  . IgM, Serum 01/20/2015 48  41 - 251 mg/dL Preliminary  . Creatinine, Urine 01/20/2015 205  20 - 370 mg/dL Final  . Total Protein, Urine 01/20/2015 670* 5 - 25 mg/dL Final   Comment: Result repeated and verified. Result confirmed by automatic dilution.   . Protein Creatinine Ratio 01/20/2015 3268* 22 - 128 mg/g creat Final     RADIOGRAPHIC STUDIES: Dg Ribs Unilateral W/chest Right  01/20/2015  CLINICAL DATA:  Multiple myeloma with right-sided chest pain EXAM: RIGHT RIBS AND CHEST - 3+ VIEW COMPARISON:  CT chest 02/03/2014. Chest CT 01/30/2014. Chest x-ray 01/05/2014 FINDINGS: Heart size within normal limits. Negative for heart failure. Lungs are clear without infiltrate or effusion Extrapleural soft tissue thickening in  the right lung apex. This has been present on prior studies and may be due to scarring. There is a chronic healed fracture of the right anterior second rib which was noted on prior studies. The right second rib also shows abnormal infiltrative low density on CT suggestive of myeloma. No definite bony destruction is seen on the current study. Chronic fracture of the right anterior fifth rib unchanged from prior studies. Negative for acute fracture. IMPRESSION: No acute cardiopulmonary disease. Chronic healed fractures right second and fifth ribs anteriorly. Right apical pleural density is similar prior studies and may be due to benign pleural thickening. Based on the prior CT there could be myeloma in the right second rib however no bony destruction is identified on the current study. Electronically Signed   By: Franchot Gallo M.D.   On: 01/20/2015 15:27    ASSESSMENT/PLAN:    Rash Patient was seen by urologist, Dr. Vikki Ports on 01/09/2015; and diagnosed with apparent prostatitis.  Patient was prescribed Cipro and Mobic.  Patient states that he was instructed to take the Cipro antibiotics for a total of 30 days.  Patient developed a rash after taking the antibiotics for approximately one week.  He states the rash is only occasionally pruritic.  Exam reveals rash/hives to entire anterior neck with some radiation of the rash to the areas behind his ears.  He also has the same rash to his bilateral forearms as well.  Patient has no other allergic-type symptoms whatsoever.  After discussing patient's symptoms with Dr. Tresa Moore urologist-patient was instructed to discontinue both the Cipro and the Mobic.   Reviewed all findings with Dr. Jana Hakim as well- and Dr. Jana Hakim.  Advised patient take Macrobid antibiotics instead for a total of 30 days for treatment of the chronic prostatitis issues.  Will place, both Cipro and Modic on patient's allergy  list.  Also, patient was given instructions to take Benadryl 25  mg every 6 hours and Pepcid 20 mg every 12 hours for treatment of rash.  He was also given a prescription for Medrol dose pack as well.  Patient was advised to go directed to the emergency department for any worsening hypersensitivity reactions whatsoever.    Prostatitis Patient has had chronic mild dysuria and gross hematuria for the past several weeks.  Patient has been treated with antibiotics on multiple occasions with no improvement.  Patient was recently sent to the emergency department for further evaluation and management of the same issue.  He was then seen by Dr. Valora Corporal urologist on 01/09/2015.  He was apparently diagnosed with chronic prostatitis; and given antibiotics for treatment.  Patient continues to complain of some mild dysuria; and gross hematuria.  He denies any recent fevers or chills.  See further notes regarding apparent allergic reaction to Cipro antibiotics given to the patient for treatment of his prostatitis.  In regards to patient's prostatitis diagnosis-patient was encouraged to follow back up with his urologist as directed.  Nausea with vomiting Patient states that he has had some chronic nausea; and vomited 3 times yesterday.  He feels dehydrated today.  Confirmed the patient does have anti-nausea medications already at home to take as directed.  Patient was given IV fluid rehydration while at the [facility] today; was also given Zofran IV.  Multiple myeloma ([place]) Patient continues to take his Revlimid oral therapy as directed.  He last received Zometa infusion on 11/14/2014.  Patient has plans to return to the [facility] this Thursday, 01/22/2015 for labs and a follow-up visit.  Patient is scheduled to return 02/05/2015 for labs, visit, and his next Zometa infusion. ---------------------------------------------------------------------------------              Hypokalemia Potassium down to 3.4 today.  Most likely, hypokalemia secondary to  dehydration.  Patient was encouraged to push potassium in his diet is much as possible.  Will continue to monitor closely.  Hypoalbuminemia due to protein-calorie malnutrition ([place]) Albumen continues decreased at 2.0.  Patient was encouraged push protein in his diet is much as possible.  Dehydration Patient reports chronic nausea and intermittent vomiting recently.  He feels dehydrated today.  Patient received IV fluid rehydration while the cancer Center today; as well as Zofran IV.  He was encouraged to push fluids at home as well.  CKD (chronic kidney disease) stage 3, GFR 30-59 ml/min Patient has a history of chronic renal insufficiency; and creatinine has now increased to 3.1.  Most likely, elevated creatinine is secondary to dehydration.  Patient received IV fluid rehydration today; and has plans to return to the [facility] on Thursday, 01/22/2015 for recheck.  Cancer associated pain Patient presents to the [facility] today with severe pain to his right lateral rib area.  He denies any known injury or trauma to the area.  He denies any chest pain, chest pressure, shortness breath, or pain with inspiration.  Exam revealed abdomen was soft and nontender in all quads.  Thousand positive in all 4 quads.  There was no flank pain with palpation.  Patient has increased tenderness to the right lateral rib area with any palpation whatsoever.  However, there is no increased pain with movement.  Chest x-ray and right rib x-ray obtained today revealed: IMPRESSION: No acute cardiopulmonary disease.  Chronic healed fractures right second and fifth ribs anteriorly.  Right apical pleural density is similar prior studies and  may be due to benign pleural thickening.  Based on the prior CT there could be myeloma in the right second rib however no bony destruction is identified on the current study.  Review these findings with Dr. Jana Hakim; and he feels that this right lateral rib pain may very  well be myeloma.  The plan is to review these findings with radiation oncology to see if there is a possibility of radiation treatments to the site.  Also, patient was given a prescription for tramadol to take for his pain.  He was advised to avoid the Mobic, ibuprofen, Advil, aspirin, Aleve, or Naprosyn.    Antineoplastic chemotherapy induced anemia Hemoglobin decreased to 8.9 today.  Most likely, this is Secondary to patient's chemotherapy.  Patient denies any worsening fatigue or shortness of breath with exertion.  We'll continue to monitor closely.     Patient stated understanding of all instructions; and was in agreement with this plan of care. The patient knows to call the clinic with any problems, questions or concerns.   Review/collaboration with Dr. Jana Hakim regarding all aspects of patient's visit today.   Total time spent with patient was 40 minutes;  with greater than 75 percent of that time spent in face to face counseling regarding patient's symptoms,  and coordination of care and follow up.  Disclaimer:This dictation was prepared with Dragon/digital dictation along with [employer]. Any transcriptional errors that result from this process are unintentional.  Drue Second, NP 01/21/2015

## 2015-01-21 NOTE — Telephone Encounter (Signed)
PER 2 ND 12/21 POF REFERRAL TO [facility]. LEFT MESSAGE FOR [facility] RE REFERRAL - [facility] WILL CONTACT PATIENT. NO OTHER ORDERS PER 2 ND 12/21 POF.

## 2015-01-21 NOTE — Assessment & Plan Note (Signed)
Patient reports chronic nausea and intermittent vomiting recently.  He feels dehydrated today.  Patient received IV fluid rehydration while the cancer Center today; as well as Zofran IV.  He was encouraged to push fluids at home as well.

## 2015-01-21 NOTE — Assessment & Plan Note (Signed)
Patient presents to the [facility] today with severe pain to his right lateral rib area.  He denies any known injury or trauma to the area.  He denies any chest pain, chest pressure, shortness breath, or pain with inspiration.  Exam revealed abdomen was soft and nontender in all quads.  Thousand positive in all 4 quads.  There was no flank pain with palpation.  Patient has increased tenderness to the right lateral rib area with any palpation whatsoever.  However, there is no increased pain with movement.  Chest x-ray and right rib x-ray obtained today revealed: IMPRESSION: No acute cardiopulmonary disease.  Chronic healed fractures right second and fifth ribs anteriorly.  Right apical pleural density is similar prior studies and may be due to benign pleural thickening.  Based on the prior CT there could be myeloma in the right second rib however no bony destruction is identified on the current study.  Review these findings with Dr. Jana Hakim; and he feels that this right lateral rib pain may very well be myeloma.  The plan is to review these findings with radiation oncology to see if there is a possibility of radiation treatments to the site.  Also, patient was given a prescription for tramadol to take for his pain.  He was advised to avoid the Mobic, ibuprofen, Advil, aspirin, Aleve, or Naprosyn.

## 2015-01-21 NOTE — Assessment & Plan Note (Signed)
Hemoglobin decreased to 8.9 today.  Most likely, this is Secondary to patient's chemotherapy.  Patient denies any worsening fatigue or shortness of breath with exertion.  We'll continue to monitor closely.

## 2015-01-21 NOTE — Assessment & Plan Note (Signed)
Patient was seen by urologist, Dr. Vikki Ports on 01/09/2015; and diagnosed with apparent prostatitis.  Patient was prescribed Cipro and Mobic.  Patient states that he was instructed to take the Cipro antibiotics for a total of 30 days.  Patient developed a rash after taking the antibiotics for approximately one week.  He states the rash is only occasionally pruritic.  Exam reveals rash/hives to entire anterior neck with some radiation of the rash to the areas behind his ears.  He also has the same rash to his bilateral forearms as well.  Patient has no other allergic-type symptoms whatsoever.  After discussing patient's symptoms with Dr. Tresa Moore urologist-patient was instructed to discontinue both the Cipro and the Mobic.   Reviewed all findings with Dr. Jana Hakim as well- and Dr. Jana Hakim.  Advised patient take Macrobid antibiotics instead for a total of 30 days for treatment of the chronic prostatitis issues.  Will place, both Cipro and Modic on patient's allergy list.  Also, patient was given instructions to take Benadryl 25 mg every 6 hours and Pepcid 20 mg every 12 hours for treatment of rash.  He was also given a prescription for Medrol dose pack as well.  Patient was advised to go directed to the emergency department for any worsening hypersensitivity reactions whatsoever.

## 2015-01-21 NOTE — Assessment & Plan Note (Signed)
Patient has a history of chronic renal insufficiency; and creatinine has now increased to 3.1.  Most likely, elevated creatinine is secondary to dehydration.  Patient received IV fluid rehydration today; and has plans to return to the [facility] on Thursday, 01/22/2015 for recheck.

## 2015-01-22 ENCOUNTER — Other Ambulatory Visit: Payer: Self-pay | Admitting: Oncology

## 2015-01-22 ENCOUNTER — Emergency Department (HOSPITAL_COMMUNITY): Payer: Medicaid Other

## 2015-01-22 ENCOUNTER — Ambulatory Visit (HOSPITAL_BASED_OUTPATIENT_CLINIC_OR_DEPARTMENT_OTHER): Payer: Self-pay | Admitting: Nurse Practitioner

## 2015-01-22 ENCOUNTER — Encounter (HOSPITAL_COMMUNITY): Payer: Self-pay

## 2015-01-22 ENCOUNTER — Other Ambulatory Visit (HOSPITAL_BASED_OUTPATIENT_CLINIC_OR_DEPARTMENT_OTHER): Payer: Self-pay

## 2015-01-22 ENCOUNTER — Inpatient Hospital Stay (HOSPITAL_COMMUNITY)
Admission: EM | Admit: 2015-01-22 | Discharge: 2015-01-27 | DRG: 683 | Disposition: A | Discharge status: Home / Self Care | Payer: Medicaid Other | Attending: Oncology | Admitting: Oncology

## 2015-01-22 VITALS — BP 127/73 | HR 68 | Temp 97.8°F | Resp 18

## 2015-01-22 DIAGNOSIS — Z23 Encounter for immunization: Secondary | ICD-10-CM

## 2015-01-22 DIAGNOSIS — D6481 Anemia due to antineoplastic chemotherapy: Secondary | ICD-10-CM

## 2015-01-22 DIAGNOSIS — G893 Neoplasm related pain (acute) (chronic): Secondary | ICD-10-CM

## 2015-01-22 DIAGNOSIS — Z6830 Body mass index (BMI) 30.0-30.9, adult: Secondary | ICD-10-CM | POA: Diagnosis not present

## 2015-01-22 DIAGNOSIS — C9000 Multiple myeloma not having achieved remission: Secondary | ICD-10-CM

## 2015-01-22 DIAGNOSIS — N183 Chronic kidney disease, stage 3 unspecified: Secondary | ICD-10-CM

## 2015-01-22 DIAGNOSIS — N189 Chronic kidney disease, unspecified: Secondary | ICD-10-CM

## 2015-01-22 DIAGNOSIS — N179 Acute kidney failure, unspecified: Principal | ICD-10-CM | POA: Diagnosis present

## 2015-01-22 DIAGNOSIS — E8809 Other disorders of plasma-protein metabolism, not elsewhere classified: Secondary | ICD-10-CM

## 2015-01-22 DIAGNOSIS — R112 Nausea with vomiting, unspecified: Secondary | ICD-10-CM

## 2015-01-22 DIAGNOSIS — N419 Inflammatory disease of prostate, unspecified: Secondary | ICD-10-CM

## 2015-01-22 DIAGNOSIS — R21 Rash and other nonspecific skin eruption: Secondary | ICD-10-CM

## 2015-01-22 DIAGNOSIS — E860 Dehydration: Secondary | ICD-10-CM | POA: Diagnosis present

## 2015-01-22 DIAGNOSIS — T451X5A Adverse effect of antineoplastic and immunosuppressive drugs, initial encounter: Secondary | ICD-10-CM

## 2015-01-22 DIAGNOSIS — R310 Gross hematuria: Secondary | ICD-10-CM | POA: Diagnosis present

## 2015-01-22 DIAGNOSIS — R809 Proteinuria, unspecified: Secondary | ICD-10-CM | POA: Diagnosis present

## 2015-01-22 DIAGNOSIS — E46 Unspecified protein-calorie malnutrition: Secondary | ICD-10-CM

## 2015-01-22 DIAGNOSIS — L270 Generalized skin eruption due to drugs and medicaments taken internally: Secondary | ICD-10-CM | POA: Diagnosis present

## 2015-01-22 DIAGNOSIS — C9002 Multiple myeloma in relapse: Secondary | ICD-10-CM

## 2015-01-22 LAB — URINALYSIS, MICROSCOPIC - CHCC
Bilirubin (Urine): NEGATIVE
Glucose: NEGATIVE mg/dL
Ketones: NEGATIVE mg/dL
Nitrite: NEGATIVE
Protein: 2000 mg/dL
Specific Gravity, Urine: 1.03 (ref 1.003–1.035)
Urobilinogen, UR: 0.2 mg/dL (ref 0.2–1)
pH: 5 (ref 4.6–8.0)

## 2015-01-22 LAB — COMPREHENSIVE METABOLIC PANEL
ALK PHOS: 191 U/L — AB (ref 40–150)
ALT: 25 U/L (ref 0–55)
ANION GAP: 13 meq/L — AB (ref 3–11)
AST: 20 U/L (ref 5–34)
Albumin: 2 g/dL — ABNORMAL LOW (ref 3.5–5.0)
BUN: 51.2 mg/dL — ABNORMAL HIGH (ref 7.0–26.0)
CALCIUM: 9.3 mg/dL (ref 8.4–10.4)
CHLORIDE: 107 meq/L (ref 98–109)
CO2: 22 mEq/L (ref 22–29)
Creatinine: 5.3 mg/dL (ref 0.7–1.3)
EGFR: 13 mL/min/{1.73_m2} — AB (ref >90)
Glucose: 128 mg/dl (ref 70–140)
POTASSIUM: 3.9 meq/L (ref 3.5–5.1)
Sodium: 142 mEq/L (ref 136–145)
Total Bilirubin: <0.3 mg/dL (ref 0.20–1.20)
Total Protein: 7.6 g/dL (ref 6.4–8.3)

## 2015-01-22 LAB — CBC WITH DIFFERENTIAL/PLATELET
BASO%: 0.2 % (ref 0.0–2.0)
Basophils Absolute: 0 10*3/uL (ref 0.0–0.1)
EOS%: 0.1 % (ref 0.0–7.0)
Eosinophils Absolute: 0 10*3/uL (ref 0.0–0.5)
HCT: 27.7 % — ABNORMAL LOW (ref 38.4–49.9)
HGB: 8.9 g/dL — ABNORMAL LOW (ref 13.0–17.1)
LYMPH%: 10 % — ABNORMAL LOW (ref 14.0–49.0)
MCH: 31.6 pg (ref 27.2–33.4)
MCHC: 32.3 g/dL (ref 32.0–36.0)
MCV: 97.9 fL (ref 79.3–98.0)
MONO#: 0.5 10*3/uL (ref 0.1–0.9)
MONO%: 6.3 % (ref 0.0–14.0)
NEUT#: 7.2 10*3/uL — ABNORMAL HIGH (ref 1.5–6.5)
NEUT%: 83.4 % — ABNORMAL HIGH (ref 39.0–75.0)
Platelets: 333 10*3/uL (ref 140–400)
RBC: 2.83 10*6/uL — ABNORMAL LOW (ref 4.20–5.82)
RDW: 15.4 % — ABNORMAL HIGH (ref 11.0–14.6)
WBC: 8.6 10*3/uL (ref 4.0–10.3)
lymph#: 0.9 10*3/uL (ref 0.9–3.3)

## 2015-01-22 LAB — PROTEIN ELECTROPHORESIS, SERUM
Abnormal Protein Band1: 1.2 g/dL
Albumin ELP: 2.2 g/dL — ABNORMAL LOW (ref 3.8–4.8)
Alpha-1-Globulin: 0.7 g/dL — ABNORMAL HIGH (ref 0.2–0.3)
Alpha-2-Globulin: 1.7 g/dL — ABNORMAL HIGH (ref 0.5–0.9)
Beta 2: 0.3 g/dL (ref 0.2–0.5)
Beta Globulin: 0.4 g/dL (ref 0.4–0.6)
Gamma Globulin: 1.6 g/dL (ref 0.8–1.7)
Total Protein, Serum Electrophoresis: 6.9 g/dL (ref 6.1–8.1)

## 2015-01-22 LAB — KAPPA/LAMBDA LIGHT CHAINS
Kappa free light chain: 12.4 mg/dL — ABNORMAL HIGH (ref 0.33–1.94)
Kappa:Lambda Ratio: 4.41 — ABNORMAL HIGH (ref 0.26–1.65)
Lambda Free Lght Chn: 2.81 mg/dL — ABNORMAL HIGH (ref 0.57–2.63)

## 2015-01-22 LAB — PROTEIN / CREATININE RATIO, URINE
Creatinine, Urine: 88.85 mg/dL
Protein Creatinine Ratio: 2.14 mg/mg{Cre} — ABNORMAL HIGH (ref 0.00–0.15)
Total Protein, Urine: 190 mg/dL

## 2015-01-22 LAB — IGG, IGA, IGM
IgA: 164 mg/dL (ref 68–379)
IgG (Immunoglobin G), Serum: 1680 mg/dL — ABNORMAL HIGH (ref 650–1600)
IgM, Serum: 48 mg/dL (ref 41–251)

## 2015-01-22 LAB — BETA 2 MICROGLOBULIN, SERUM: Beta-2 Microglobulin: 8.91 mg/L — ABNORMAL HIGH (ref <=2.51)

## 2015-01-22 MED ORDER — TRAMADOL HCL 50 MG PO TABS
50.0000 mg | ORAL_TABLET | Freq: Four times a day (QID) | ORAL | Status: DC | PRN
  Administered 2015-01-26: 50 mg via ORAL
  Filled 2015-01-22: qty 1

## 2015-01-22 MED ORDER — SENNOSIDES-DOCUSATE SODIUM 8.6-50 MG PO TABS
1.0000 | ORAL_TABLET | Freq: Every evening | ORAL | Status: DC | PRN
  Administered 2015-01-22: 1 via ORAL
  Filled 2015-01-22: qty 1

## 2015-01-22 MED ORDER — ONDANSETRON HCL 4 MG/2ML IJ SOLN: 4.0000 mg | Freq: Three times a day (TID) | INTRAMUSCULAR | Status: AC | PRN

## 2015-01-22 MED ORDER — HEPARIN SODIUM (PORCINE) 5000 UNIT/ML IJ SOLN
5000.0000 [IU] | Freq: Three times a day (TID) | INTRAMUSCULAR | Status: DC
  Administered 2015-01-22 – 2015-01-27 (×15): 5000 [IU] via SUBCUTANEOUS
  Filled 2015-01-22 (×11): qty 1

## 2015-01-22 MED ORDER — ACETAMINOPHEN 325 MG PO TABS: 650.0000 mg | ORAL_TABLET | Freq: Four times a day (QID) | ORAL | Status: DC | PRN

## 2015-01-22 MED ORDER — INFLUENZA VAC SPLIT QUAD 0.5 ML IM SUSY
0.5000 mL | PREFILLED_SYRINGE | INTRAMUSCULAR | Status: AC
  Administered 2015-01-23: 0.5 mL via INTRAMUSCULAR
  Filled 2015-01-22: qty 0.5

## 2015-01-22 MED ORDER — TRAMADOL HCL 50 MG PO TABS
50.0000 mg | ORAL_TABLET | Freq: Four times a day (QID) | ORAL | Status: DC | PRN
  Administered 2015-01-22: 50 mg via ORAL
  Filled 2015-01-22: qty 1

## 2015-01-22 MED ORDER — PREDNISONE 20 MG PO TABS
40.0000 mg | ORAL_TABLET | Freq: Every day | ORAL | Status: DC
  Administered 2015-01-22 – 2015-01-23 (×2): 40 mg via ORAL
  Filled 2015-01-22 (×2): qty 2

## 2015-01-22 MED ORDER — SODIUM CHLORIDE 0.9 % IV SOLN
Freq: Once | INTRAVENOUS | Status: AC
  Administered 2015-01-22: 14:00:00 via INTRAVENOUS

## 2015-01-22 MED ORDER — SODIUM CHLORIDE 0.9 % IV SOLN
INTRAVENOUS | Status: AC
  Administered 2015-01-22 – 2015-01-23 (×2): via INTRAVENOUS

## 2015-01-22 MED ORDER — ACETAMINOPHEN 650 MG RE SUPP
650.0000 mg | Freq: Four times a day (QID) | RECTAL | Status: DC | PRN
Start: 2015-01-22 — End: 2015-01-27

## 2015-01-22 MED ORDER — TRAMADOL HCL 50 MG PO TABS: 50.0000 mg | ORAL_TABLET | Freq: Four times a day (QID) | ORAL | Status: DC | PRN

## 2015-01-22 NOTE — Assessment & Plan Note (Signed)
Patient was seen by urologist, Dr. Vikki Ports on 01/09/2015; and diagnosed with apparent prostatitis.  Patient was prescribed Cipro and Mobic.  Patient states that he was instructed to take the Cipro antibiotics for a total of 30 days.  Patient developed a rash after taking the antibiotics for approximately one week.  He states the rash is only occasionally pruritic.  Exam reveals rash/hives to entire anterior neck with some radiation of the rash to the areas behind his ears.  He also has the same rash to his bilateral forearms as well.  Patient has no other allergic-type symptoms whatsoever.  After discussing patient's symptoms with Dr. Tresa Moore urologist-patient was instructed to discontinue both the Cipro and the Mobic.   Reviewed all findings with Dr. Jana Hakim as well- and Dr. Jana Hakim.  Advised patient take Macrobid antibiotics instead for a total of 30 days for treatment of the chronic prostatitis issues.  Will place, both Cipro and Modic on patient's allergy list.  Also, patient was given instructions to take Benadryl 25 mg every 6 hours and Pepcid 20 mg every 12 hours for treatment of rash.  He was also given a prescription for Medrol dose pack as well.  Patient was advised to go directed to the emergency department for any worsening hypersensitivity reactions whatsoever.   ______________________________________  Update: Patient's rash to his anterior neck and bilateral forearms has greatly improved within the past 48 hours.  Patient confirmed that he is still taking the Benadryl and Pepcid as directed.  He is also continuing with the Medrol Dosepak .  He was prescribed as well.  He denies any other new hypersensitivity reaction symptoms.

## 2015-01-22 NOTE — ED Notes (Signed)
Pt. Sent for evaluation of kidney function from [facility]. Pt. Has multiple myeloma. Complaint of intermittent nausea, denies vomiting/diarrhea/pain today. Pt. AxO x4, ambulatory.

## 2015-01-22 NOTE — Consult Note (Signed)
Charles [name] is an 39 y.o. [name] referred by Dr Beryle Beams   Chief Complaint: ARF HPI: Charles [name] told to come to ER for rising Scr.  Pt being treated for myeloma with revlimid.  Over the past month he has had gross hematuria and was referred to urology and Dx with prostatitis.  He was given cipro and mobic which he has been taking for last week in a half.  Developed rash a few days ago and both drugs stopped and  he was placed on medrol dose pak.  Has baseline Scr low 1's but on 01/20/15 Scr 3.1 and today 5.3. On review of his UA's, he has had TNTC rbc's in his urine since 1/16 and fewer and variable WBC's.  Renal US unremarkable.  4 urine cultures done in last month neg for UTI.  Rash is improving.   Past Medical History  Diagnosis Date  . IgG myeloma ([name])   . CKD (chronic kidney disease), stage III   . Hypercholesterolemia   . Lytic bone lesions on xray     History reviewed. No pertinent past surgical history.  Family History  Problem Relation Age of Onset  . Diabetes Father   FH neg for renal failure  Social History:  reports that he has never smoked. He has never used smokeless tobacco. He reports that he does not drink alcohol or use illicit drugs. Married, lives with wife.  Has 5 kids.  Worked in [employer] Allergies:  Allergies  Allergen Reactions  . Ciprofloxacin Rash  . Mobic [Meloxicam] Rash     (Not in a hospital admission)   Lab Results: UA: TNTC rbc's, 11-20 wbc's, 2000 protein    Recent Labs  01/20/15 0952 01/22/15 1018  WBC 7.2 8.6  HGB 8.9* 8.9*  HCT 27.3* 27.7*  PLT 333 333   BMET  Recent Labs  01/20/15 0952 01/22/15 1018  NA 138 142  K 3.4* 3.9  CO2 24 22  GLUCOSE 99 128  BUN 21.9 51.2*  CREATININE 3.1* 5.3*  CALCIUM 9.5 9.3   LFT  Recent Labs  01/22/15 1018  PROT 7.6  ALBUMIN 2.0*  AST 20  ALT 25  ALKPHOS 191*  BILITOT <0.30   Dg Ribs Unilateral W/chest Right  01/20/2015  CLINICAL DATA:  Multiple myeloma with  right-sided chest pain EXAM: RIGHT RIBS AND CHEST - 3+ VIEW COMPARISON:  CT chest 02/03/2014. Chest CT 01/30/2014. Chest x-ray 01/05/2014 FINDINGS: Heart size within normal limits. Negative for heart failure. Lungs are clear without infiltrate or effusion Extrapleural soft tissue thickening in the right lung apex. This has been present on prior studies and may be due to scarring. There is a chronic healed fracture of the right anterior second rib which was noted on prior studies. The right second rib also shows abnormal infiltrative low density on CT suggestive of myeloma. No definite bony destruction is seen on the current study. Chronic fracture of the right anterior fifth rib unchanged from prior studies. Negative for acute fracture. IMPRESSION: No acute cardiopulmonary disease. Chronic healed fractures right second and fifth ribs anteriorly. Right apical pleural density is similar prior studies and may be due to benign pleural thickening. Based on the prior CT there could be myeloma in the right second rib however no bony destruction is identified on the current study. Electronically Signed   By: Franchot Gallo M.D.   On: 01/20/2015 15:27   US Renal  01/22/2015  CLINICAL DATA:  Renal failure.  Chronic kidney disease stage  3. EXAM: RENAL / URINARY TRACT ULTRASOUND COMPLETE COMPARISON:  01/01/2015 FINDINGS: Right Kidney: Length: 11.1. Increased parenchymal echogenicity. No mass or hydronephrosis visualized. Left Kidney: Length: 12.2 cm. Increased parenchymal echogenicity. There is a cyst arising from the upper pole measuring 1.1 x 1.2 x 0.9 cm peer no mass or hydronephrosis. Bladder: Appears normal for degree of bladder distention. IMPRESSION: 1. No acute findings 2. Bilateral echogenic kidneys compatible with chronic medical renal disease. 3. Left kidney cyst. Electronically Signed   By: Kerby Moors M.D.   On: 01/22/2015 14:23    ROS: No change in vision No SOB + rash on arms, neck that is  improving No CP CO pain lower Rt rib cage No change in bowels Int numbness hands and feet No dysuria  PHYSICAL EXAM: Blood pressure 123/70, pulse 70, temperature 97.7 F (36.5 C), temperature source Oral, resp. rate 22, SpO2 96 %. HEENT: PERRLA EOMI Skin:  Mild erythema of arms and raised lesions behind both ears NECK:No JVD No bruits LUNGS:Clear CARDIAC:RRR wo MRG ABD:+ BS NTND No HSM.  Mild tenderness Rt lower rib cage, no palpable mass EXT:No edema NEURO:CNI M&SI Ox3 no asterixis  Assessment: 1. ARF  DDx Myeloma, Allergic interstitial nephritis or ? Vasculitis since he has had hematuria for a while (though if vasculitis would have expected renal fx to have worsened months ago) +/- Mobic 2. Rash probably allergic 3. Multiple Myeloma 4. Worsening anemia which raising issue of worsening myeloma PLAN: 1. Quantify proteinuria 2. PO steroids empirically for rash 3. Hematology needs to see pt 4. Will check serologic studies due to hematuria 5. Daily Scr.   6. Discussed HD with pt if renal fx were to worsen.  He would do if needed.   Charles [name] T 01/22/2015, 3:09 PM

## 2015-01-22 NOTE — ED Notes (Signed)
Retta Mac, NP from [facility]  [phone]) can be reached with any questions regarding previous care.

## 2015-01-22 NOTE — Assessment & Plan Note (Addendum)
Patient continues to take his Revlimid oral therapy as directed.  He last received Zometa infusion on 11/14/2014.  Patient was seen earlier this week with complaint of acute onset severe pain to his right lateral rib area.  Chest/rib x-ray revealed healed rib fracture; but no acute issues are bony destruction.  Patient is scheduled to return 02/05/2015 for labs, visit, and his next Zometa infusion. ---------------------------------------------------------------------------------  Update: Blood counts obtained today revealed a WBC of 8.6, ANC 7.2, hemoglobin 8.9, platelet count 333.  Due to patient's continued right lateral rib discomfort and progressive renal insufficiency-patient will be transported to the emergency department for further evaluation and management.  Patient is scheduled to return on 02/05/2015 for labs, visit, and a Zometa infusion.

## 2015-01-22 NOTE — Assessment & Plan Note (Signed)
Patient has had chronic mild dysuria and gross hematuria for the past several weeks.  Patient has been treated with antibiotics on multiple occasions with no improvement.  Patient was recently sent to the emergency department for further evaluation and management of the same issue.  He was then seen by Dr. Valora Corporal urologist on 01/09/2015.  He was apparently diagnosed with chronic prostatitis; and given antibiotics for treatment.  Patient continues to complain of some mild dysuria; and gross hematuria.  He denies any recent fevers or chills.  See further notes regarding apparent allergic reaction to Cipro antibiotics given to the patient for treatment of his prostatitis.  In regards to patient's prostatitis diagnosis-patient was encouraged to follow back up with his urologist as directed. _______________________________________  Update: Patient presented back to the [facility] today for a recheck.  Patient states he is feeling some better; but continues with nausea and intermittent vomiting.  He continues to feel dehydrated.  He also continues to complain of some very mild dysuria and gross hematuria.  Urinalysis obtained today did reveal a large amount of blood, protein 2000, RBCs too numerous to count, WBCs 11-20, and a moderate amount of bacteria.  Also, urine was noted to have positive red cell casts.  Also, creatinine has increased from baseline of 1.0 for up to 5.3.  EGFR today was 13.  Reviewed all of these findings with Dr. Jana Hakim in detail; and he suggested that patient go directly to [facility] Emergency Department for further evaluation and management.  Dr. 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  However, Dr. Jana Hakim advised that it is fairly rare for myeloma to damage the kidney.  He feels that patient will need a full urological consult; and perhaps a urological procedure for further evaluation.  (Of note- CT with contrast abd/pelvis and renal US were obtained within past few weeks with no obvious acute findings).   Due to the continuation/progression of all of these symptoms.- Patient will go directly to [facility] emergency department for further evaluation and management.  Patient was given a printed note instructing him to report to the [facility] emergency department charge nurse.  Also, brief history and report were given to the [facility] emergency department charge nurse as well.  Jamesetta So, RN Charge in ED advised would hold a room for the patient in the emergency department.  Advised the colon emergency department nurse that patient was undergoing active oral chemotherapy at this time; and would need to avoid contact with multiple patients at all possible.  Also, advised the [facility] emergency department charge nurse that patient would need a new translator to assist them as well.

## 2015-01-22 NOTE — H&P (Signed)
Date: 01/22/2015               Patient Name:  Charles [name] MRN: [id number]  DOB: Jun 07, 1975 Age / Sex: 39 y.o., male   PCP: Tresa Garter, MD         Medical Service: Internal Medicine Teaching Service         Attending Physician: Dr. Annia Belt, MD    First Contact: Dr. Jule Ser Pager: [phone]  Second Contact: Dr. Julious Oka Pager: [phone]       After Hours (After 5p/  First Contact Pager: [phone]  weekends / holidays): Second Contact Pager: [phone]   Chief Complaint: Painless hematuria and acute kidney injury  History of Present Illness: [name] is a 39yo Spanish-speaking M with PMH of multiple myeloma (stage IIA diagnosed January 2016 initially on CyBorD, now on lenalidomide and zoledronic acid) who presents to the ED after being sent from his oncology clinic for acutely worsening renal function. Starting one month ago, he started having worsening malaise, fatigue, nausea with occasional vomiting and painless gross hematuria. He was referred to Dr. Vikki Ports in urology on 01/09/2015, where he was diagnosed with prostatitis and started on cipro and mobic. CT A/P and renal ultrasound 1 month ago were After taking both medicines for 1 week, he developed a rash on his neck and forearms without other symptoms, and discontinued both medicines at his oncologist's office yesterday. The rash improved greatly with benadryl, pepcid, and a medrol dose pack. He had been vomiting intermittently over the past couple of days and was felt to be dehydrated, evidenced by a rising creatinine to 3.1 and K+ 3.4. He was given IV fluids and zofran in the office yesterday. He returned to clinic for re-check today and his creatinine rose even further to 5.3 so he was sent to the ED for further evaluation and management.   Over the past 1 month, besides the above symptoms, he also notes occasional night sweats and subjective chills as well as increased urinary urgency  and mild testicular pain only when he lifts heavy objects. He denies headache, vision changes, chest pain, shortness of breath, palpitations, dysuria, perineal pain/pain with sitting, changes in bowel movement, recent travel, or sick contacts. Additionally, he has noted worsening right lateral rib pain, with plain film yesterday showing possibility of myeloma involvement in the R 2nd rib. He also endorses moderate R hip pain that is chronic and unchanged.   Meds: Current Facility-Administered Medications  Medication Dose Route Frequency Provider Last Rate Last Dose  . 0.9 %  sodium chloride infusion   Intravenous STAT Noemi Chapel, MD 100 mL/hr at 01/22/15 1630    . acetaminophen (TYLENOL) tablet 650 mg  650 mg Oral Q6H PRN Jones Bales, MD       Or  . acetaminophen (TYLENOL) suppository 650 mg  650 mg Rectal Q6H PRN Jones Bales, MD      . heparin injection 5,000 Units  5,000 Units Subcutaneous 3 times per day Jones Bales, MD   5,000 Units at 01/22/15 1657  . [START ON 01/23/2015] Influenza vac split quadrivalent PF (FLUARIX) injection 0.5 mL  0.5 mL Intramuscular Tomorrow-1000 Annia Belt, MD      . ondansetron [facility]) injection 4 mg  4 mg Intravenous Q8H PRN Noemi Chapel, MD      . predniSONE (DELTASONE) tablet 40 mg  40 mg Oral Q breakfast Fleet Contras, MD   40 mg at 01/22/15 1657  .  senna-docusate (Senokot-S) tablet 1 tablet  1 tablet Oral QHS PRN Jones Bales, MD      . traMADol Veatrice Bourbon) tablet 50 mg  50 mg Oral Q6H PRN Jones Bales, MD        Allergies: Allergies as of 01/22/2015 - Review Complete 01/22/2015  Allergen Reaction Noted  . Ciprofloxacin Rash 01/21/2015  . Mobic [meloxicam] Rash 01/21/2015   Past Medical History  Diagnosis Date  . 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 CKD (chronic kidney disease), stage III   . Hypercholesterolemia   . Lytic bone lesions on xray    History reviewed. No pertinent past surgical history. Family History  Problem  Relation Age of Onset  . Diabetes Father    Social History   Social History  . Marital Status: Married    Spouse Name: N/A  . Number of Children: 5  . Years of Education: N/A   Occupational History  . Roofer    Social History Main Topics  . Smoking status: Never Smoker   . Smokeless tobacco: Never Used  . Alcohol Use: No  . Drug Use: No  . Sexual Activity:    Partners: Female   Other Topics Concern  . Not on file   Social History Narrative    Review of Systems: Pertinent items noted in HPI and remainder of comprehensive ROS otherwise negative.  Physical Exam: Blood pressure 128/71, pulse 67, temperature 98 F (36.7 C), temperature source Oral, resp. rate 18, height 5' 3"  (1.6 m), weight 158 lb 1.6 oz (71.714 kg), SpO2 99 %.     Gen: Well-appearing, alert and oriented to person, place, and time HEENT: Oropharynx clear without erythema or exudate.  Neck: No cervical LAD, no thyromegaly or nodules, no JVD noted. CV: Normal rate, regular rhythm, no murmurs, rubs, or gallops Pulmonary: Normal effort, CTA bilaterally, no wheezing, rales, or rhonchi Abdominal: Soft, non-tender, non-distended, without rebound, guarding, or masses. No CVA tenderness, no suprapubic tenderness. Extremities: Distal pulses 2+ in upper and lower extremities bilaterally, no tenderness, erythema or edema MSK: Tenderness to palpation over the R lateral rib Skin: No atypical appearing moles. There is mild non-pruritic maculopapular rashes over the bilateral forearms and behind both ears.  Lab results: Basic Metabolic Panel:  Recent Labs  01/20/15 0952 01/22/15 1018  NA 138 142  K 3.4* 3.9  CO2 24 22  GLUCOSE 99 128  BUN 21.9 51.2*  CREATININE 3.1* 5.3*  CALCIUM 9.5 9.3   Liver Function Tests:  Recent Labs  01/20/15 0952 01/22/15 1018  AST 22 20  ALT 22 25  ALKPHOS 150 191*  BILITOT 0.42 <0.30  PROT 7.9 7.6  ALBUMIN 2.0* 2.0*   CBC:  Recent Labs  01/20/15 0952 01/22/15 1018   WBC 7.2 8.6  NEUTROABS 5.1 7.2*  HGB 8.9* 8.9*  HCT 27.3* 27.7*  MCV 96.8 97.9  PLT 333 333   Urinalysis:  Recent Labs  01/22/15 1018  LABSPEC 1.030  PHURINE 5.0  GLUCOSEU Negative  HGBUR Large  BILIRUBINUR Negative  KETONESUR Negative  PROTEINUR 2000  UROBILINOGEN 0.2  NITRITE Negative  LEUKOCYTESUR Color Interference due to blood in urine   Imaging results:  US Renal  01/22/2015  CLINICAL DATA:  Renal failure.  Chronic kidney disease stage 3. EXAM: RENAL / URINARY TRACT ULTRASOUND COMPLETE COMPARISON:  01/01/2015 FINDINGS: Right Kidney: Length: 11.1. Increased parenchymal echogenicity. No mass or hydronephrosis visualized. Left Kidney: Length: 12.2 cm. Increased parenchymal echogenicity. There is a cyst arising from the upper  pole measuring 1.1 x 1.2 x 0.9 cm peer no mass or hydronephrosis. Bladder: Appears normal for degree of bladder distention. IMPRESSION: 1. No acute findings 2. Bilateral echogenic kidneys compatible with chronic medical renal disease. 3. Left kidney cyst. Electronically Signed   By: Kerby Moors M.D.   On: 01/22/2015 14:23   Assessment & Plan by Problem: Principal Problem:   AKI (acute kidney injury) ([place]) Active Problems:   Multiple myeloma ([facility])   Prostatitis   Hypoalbuminemia due to protein-calorie malnutrition ([facility])   Antineoplastic chemotherapy induced anemia 1. Acute kidney injury with worsening hematuria and proteinuria - Acutely worsening creatinine from baseline ~1 to 5.3. Renal ultrasound and CT A/P are unremarkable for an apparent cause at this time. Initially treated for prostatitis with levaquin and mobic, with rash developing 1 week later. It is highly unlikely that these symptoms are entirely explained by an interstitial nephritis, given his worsening hematuria and proteinuria were present well before starting levaquin and mobic. It is more likely that his symptoms and worsening renal function are a result of a combination of dehydration  from nausea/vomiting, NSAID and levofloxacin use, as well as renal involvement of multiple myeloma. The latter is what is most concerning, further supported by his general malaise/fatigue/nausea/vomiting (the same nonspecific symptoms which he had leading to diagnosis), worsening anemia, and new bony lesion on his R rib. -Nephrology consulted; appreciate recs -F/u renal ultrasound -Consider urology consultation - may need retrograde urethrogram and/or cystoscopy -IVF -F/u vasculitis and autoimmune serologies  2. Multiple myeloma - on lenalidomide maintenance currently, followed with Dr. Jana Hakim. Has had recent increasing kappa and IgG as well as worsening kappa:lambda ratio back to levels similar to initial presentation in 01/2014, concerning for progression -Dr. Jana Hakim to see tomorrow; appreciate recs -May consider plain film skeletal survey (NOT bone scan) -Continue acetaminophen and tramadol for bone pain -Zofran PRN  3. Worsening normocytic anemia - baseline 13-14, now 8.9 from 11.9 only 3 weeks ago. Patient is on lenalidomide, which is frequently associated with hematologic toxicity. However, in the context of his other symptoms, there is a concern for worsening multiple myeloma. -Follow-up CBCs  4. Maculopapular bilateral forearm rash - Much improved, likely drug-induced rash, without other symptoms suggestive of systemic allergic reaction -Continue medrol dose pack, benadryl, pepcid  Dispo: Disposition is deferred at this time, awaiting improvement of current medical problems. Anticipated discharge in approximately 1-3 day(s).   The patient does have a current PCP (Tresa Garter, MD) and does need an [facility] hospital follow-up appointment after discharge.  The patient does not have transportation limitations that hinder transportation to clinic appointments.  Signed: Norval Gable, MD 01/22/2015, 6:26 PM

## 2015-01-22 NOTE — ED Provider Notes (Signed)
CSN: [id number]     Arrival date & time 01/22/15  1312 History   First MD Initiated Contact with Patient 01/22/15 1317     Chief Complaint  Patient presents with  . Abnormal Lab     (Consider location/radiation/quality/duration/timing/severity/associated sxs/prior Treatment) HPI  The patient is a 39 year old male, he presents with a complaint of acute renal failure from the oncology office is. 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 He is currently taking Zometa and Revlimid. He has had progressive fatigue and nausea - he is drinking fluids, not taking much solid.  He has no fevers, no chills and no sob or cP.  Sent to ED for admission - Urological consultation and treatment - renal US for possible obstruction / renal disease.  Past Medical History  Diagnosis Date  . 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 CKD (chronic kidney disease), stage III   . Hypercholesterolemia   . Lytic bone lesions on xray    History reviewed. No pertinent past surgical history. Family History  Problem Relation Age of Onset  . Diabetes Father    Social History  Substance Use Topics  . Smoking status: Never Smoker   . Smokeless tobacco: Never Used  . Alcohol Use: No    Review of Systems  All other systems reviewed and are negative.     Allergies  Ciprofloxacin and Mobic  Home Medications   Prior to Admission medications   Medication Sig Start Date End Date Taking? Authorizing Provider  acyclovir (ZOVIRAX) 400 MG tablet TAKE 1 TABLET BY MOUTH 5 TIMES A DAY 01/07/15  Yes [name], MD  diphenhydrAMINE (SOMINEX) 25 MG tablet Take 25 mg by mouth at bedtime as needed for allergies.   Yes Historical Provider, MD  fluconazole (DIFLUCAN) 100 MG  tablet Take 1 tablet (100 mg total) by mouth daily. 10/15/14  Yes [name], MD  lenalidomide (REVLIMID) 10 MG capsule Take 1 capsule (10 mg total) by mouth daily. Take 1 daily for 21 days, then off for 7 days 01/08/15  Yes [name], MD  meloxicam (MOBIC) 15 MG tablet Take 15 mg by mouth at bedtime. 01/09/15  Yes Historical Provider, MD  methylPREDNISolone (MEDROL DOSEPAK) 4 MG TBPK tablet Medrol dose pak: take as directed. Patient taking differently: Take 4 mg by mouth taper from 4 doses each day to 1 dose and stop. Medrol dose pak: take as directed. 01/20/15  Yes [name], NP  nitrofurantoin, macrocrystal-monohydrate, (MACROBID) 100 MG capsule Take 1 capsule (100 mg total) by mouth 2 (two) times daily. 01/20/15  Yes [name], NP  ondansetron (ZOFRAN) 8 MG tablet Take 1 tablet (8 mg total) by mouth every 8 (eight) hours as needed for nausea or vomiting. 12/26/14  Yes [name], NP  traMADol (ULTRAM) 50 MG tablet Take 1 tablet (50 mg total) by mouth every 6 (six) hours as needed. 01/20/15  Yes [name], NP  erythromycin ophthalmic ointment Place 1 application into the right eye 4 (four) times daily. Patient not taking: Reported on 12/28/2014 12/18/14   [name], NP   BP 123/70 mmHg  Pulse 70  Temp(Src) 97.7 F (36.5 C) (Oral)  Resp 22  SpO2 96% Physical Exam  Constitutional:  He appears well-developed and well-nourished. No distress.  HENT:  Head: Normocephalic and atraumatic.  Mouth/Throat: Oropharynx is clear and moist. No oropharyngeal exudate.  Eyes: Conjunctivae and EOM are normal. Pupils are equal, round, and reactive to light. Right eye exhibits no discharge. Left eye exhibits no discharge. No scleral icterus.  Neck: Normal range of motion. Neck supple. No JVD present. No thyromegaly present.  Cardiovascular: Normal rate, regular rhythm, normal heart sounds and intact distal pulses.  Exam reveals no gallop and no friction rub.   No murmur  heard. Pulmonary/Chest: Effort normal and breath sounds normal. No respiratory distress. He has no wheezes. He has no rales.  Abdominal: Soft. Bowel sounds are normal. He exhibits no distension and no mass. There is no tenderness.  Musculoskeletal: Normal range of motion. He exhibits no edema or tenderness.  Lymphadenopathy:    He has no cervical adenopathy.  Neurological: He is alert. Coordination normal.  Skin: Skin is warm and dry. No rash noted. No erythema.  Psychiatric: He has a normal mood and affect. His behavior is normal.  Nursing note and vitals reviewed.   ED Course  Procedures (including critical care time) Labs Review Labs Reviewed - No data to display  Imaging Review Dg Ribs Unilateral W/chest Right  01/20/2015  CLINICAL DATA:  Multiple myeloma with right-sided chest pain EXAM: RIGHT RIBS AND CHEST - 3+ VIEW COMPARISON:  CT chest 02/03/2014. Chest CT 01/30/2014. Chest x-ray 01/05/2014 FINDINGS: Heart size within normal limits. Negative for heart failure. Lungs are clear without infiltrate or effusion Extrapleural soft tissue thickening in the right lung apex. This has been present on prior studies and may be due to scarring. There is a chronic healed fracture of the right anterior second rib which was noted on prior studies. The right second rib also shows abnormal infiltrative low density on CT suggestive of myeloma. No definite bony destruction is seen on the current study. Chronic fracture of the right anterior fifth rib unchanged from prior studies. Negative for acute fracture. IMPRESSION: No acute cardiopulmonary disease. Chronic healed fractures right second and fifth ribs anteriorly. Right apical pleural density is similar prior studies and may be due to benign pleural thickening. Based on the prior CT there could be myeloma in the right second rib however no bony destruction is identified on the current study. Electronically Signed   By: Franchot Gallo M.D.   On: 01/20/2015  15:27   US Renal  01/22/2015  CLINICAL DATA:  Renal failure.  Chronic kidney disease stage 3. EXAM: RENAL / URINARY TRACT ULTRASOUND COMPLETE COMPARISON:  01/01/2015 FINDINGS: Right Kidney: Length: 11.1. Increased parenchymal echogenicity. No mass or hydronephrosis visualized. Left Kidney: Length: 12.2 cm. Increased parenchymal echogenicity. There is a cyst arising from the upper pole measuring 1.1 x 1.2 x 0.9 cm peer no mass or hydronephrosis. Bladder: Appears normal for degree of bladder distention. IMPRESSION: 1. No acute findings 2. Bilateral echogenic kidneys compatible with chronic medical renal disease. 3. Left kidney cyst. Electronically Signed   By: Kerby Moors M.D.   On: 01/22/2015 14:23   I have personally reviewed and evaluated these images and lab results as part of my medical decision-making.    MDM   Final diagnoses:  None    The pt does not appear toxic, he has normla VS - he has abnormal labs Reviewed EMR Has Cr of > 5, BUN > 50, UA showed casts and signs of infection   Macrobid not effective in failure Both chemo meds  could cause failure. Will admit.  Discussed with Dr. Mercy Moore of renal, he will see the patient in consultation, discussed with internal medicine residents who will admit.    Noemi Chapel, MD 01/22/15 [phone]

## 2015-01-22 NOTE — Assessment & Plan Note (Signed)
Patient continues to complain of some mild nausea; and has had some intermittent vomiting as well.  He has anti--nausea medications home to take as directed.

## 2015-01-22 NOTE — ED Notes (Signed)
Attempted report 

## 2015-01-22 NOTE — Progress Notes (Signed)
Admission note:  Arrival Method: Stretcher from ED Mental Orientation: A&OX4 Telemetry: N/A Assessment: See flowsheet Skin: Dry; intact, rash on R forearm IV: R A/C NS 152m/hr Pain: 5/10 R flank pain; refuses medication Tubes: N/A Safety Measures: Bed in lowest position, call light and all belongings within reach, non-skid socks placed. Fall Prevention Safety Plan: Reviewed Admission Screening: Completed 6700 Orientation: Patient has been oriented to the unit, staff and to the room.  Orders have been implement. Will continue to assess and monitor pt.   KCarole Civil RN

## 2015-01-23 ENCOUNTER — Encounter: Payer: Self-pay | Admitting: Nurse Practitioner

## 2015-01-23 ENCOUNTER — Telehealth: Payer: Self-pay | Admitting: Oncology

## 2015-01-23 DIAGNOSIS — N179 Acute kidney failure, unspecified: Principal | ICD-10-CM

## 2015-01-23 DIAGNOSIS — N183 Chronic kidney disease, stage 3 (moderate): Secondary | ICD-10-CM

## 2015-01-23 DIAGNOSIS — C9000 Multiple myeloma not having achieved remission: Secondary | ICD-10-CM

## 2015-01-23 LAB — RENAL FUNCTION PANEL
ALBUMIN: 1.6 g/dL — AB (ref 3.5–5.0)
Anion gap: 10 (ref 5–15)
BUN: 60 mg/dL — AB (ref 6–20)
CHLORIDE: 108 mmol/L (ref 101–111)
CO2: 24 mmol/L (ref 22–32)
Calcium: 7.7 mg/dL — ABNORMAL LOW (ref 8.9–10.3)
Creatinine, Ser: 5.36 mg/dL — ABNORMAL HIGH (ref 0.61–1.24)
GFR calc Af Amer: 14 mL/min — ABNORMAL LOW (ref >60)
GFR calc non Af Amer: 12 mL/min — ABNORMAL LOW (ref >60)
GLUCOSE: 128 mg/dL — AB (ref 65–99)
PHOSPHORUS: 6.9 mg/dL — AB (ref 2.5–4.6)
POTASSIUM: 4.5 mmol/L (ref 3.5–5.1)
Sodium: 142 mmol/L (ref 135–145)

## 2015-01-23 LAB — CBC
HEMATOCRIT: 24.2 % — AB (ref 39.0–52.0)
Hemoglobin: 7.7 g/dL — ABNORMAL LOW (ref 13.0–17.0)
MCH: 31.3 pg (ref 26.0–34.0)
MCHC: 31.8 g/dL (ref 30.0–36.0)
MCV: 98.4 fL (ref 78.0–100.0)
PLATELETS: 346 10*3/uL (ref 150–400)
RBC: 2.46 MIL/uL — ABNORMAL LOW (ref 4.22–5.81)
RDW: 15.1 % (ref 11.5–15.5)
WBC: 7.4 10*3/uL (ref 4.0–10.5)

## 2015-01-23 LAB — C3 COMPLEMENT: C3 Complement: 78 mg/dL — ABNORMAL LOW (ref 82–167)

## 2015-01-23 LAB — GLOMERULAR BASEMENT MEMBRANE ANTIBODIES: GBM AB: 4 U (ref 0–20)

## 2015-01-23 LAB — C4 COMPLEMENT: COMPLEMENT C4, BODY FLUID: 18 mg/dL (ref 14–44)

## 2015-01-23 LAB — URINE CULTURE

## 2015-01-23 LAB — ANTINUCLEAR ANTIBODIES, IFA: ANA Ab, IFA: NEGATIVE

## 2015-01-23 LAB — MPO/PR-3 (ANCA) ANTIBODIES
ANCA Proteinase 3: <3.5 U/mL (ref 0.0–3.5)
Myeloperoxidase Abs: <9 U/mL (ref 0.0–9.0)

## 2015-01-23 LAB — PSA: PSA: 3.48 ng/mL (ref 0.00–4.00)

## 2015-01-23 MED ORDER — SODIUM CHLORIDE 0.9 % IV SOLN
INTRAVENOUS | Status: AC
  Administered 2015-01-23 – 2015-01-24 (×3): via INTRAVENOUS

## 2015-01-23 MED ORDER — ACYCLOVIR 200 MG PO CAPS
400.0000 mg | ORAL_CAPSULE | Freq: Two times a day (BID) | ORAL | Status: DC
  Administered 2015-01-23: 400 mg via ORAL
  Filled 2015-01-23: qty 2

## 2015-01-23 MED ORDER — BOOST / RESOURCE BREEZE PO LIQD
1.0000 | Freq: Two times a day (BID) | ORAL | Status: DC
  Administered 2015-01-23 – 2015-01-27 (×9): 1 via ORAL

## 2015-01-23 MED ORDER — DEXAMETHASONE SODIUM PHOSPHATE 10 MG/ML IJ SOLN
10.0000 mg | Freq: Four times a day (QID) | INTRAMUSCULAR | Status: AC
  Administered 2015-01-23 – 2015-01-24 (×4): 10 mg via INTRAVENOUS
  Filled 2015-01-23: qty 1
  Filled 2015-01-23: qty 2.5
  Filled 2015-01-23 (×2): qty 1
  Filled 2015-01-23 (×3): qty 2.5

## 2015-01-23 MED ORDER — ONDANSETRON HCL 4 MG/2ML IJ SOLN: 4.0000 mg | Freq: Three times a day (TID) | INTRAMUSCULAR | Status: AC | PRN

## 2015-01-23 MED ORDER — ACYCLOVIR 200 MG PO CAPS
400.0000 mg | ORAL_CAPSULE | Freq: Every day | ORAL | Status: DC
  Administered 2015-01-24 – 2015-01-27 (×4): 400 mg via ORAL
  Filled 2015-01-23 (×4): qty 2

## 2015-01-23 NOTE — Assessment & Plan Note (Signed)
Patient presents to the [facility] today with severe pain to his right lateral rib area.  He denies any known injury or trauma to the area.  He denies any chest pain, chest pressure, shortness breath, or pain with inspiration.  Exam revealed abdomen was soft and nontender in all quads.  Thousand positive in all 4 quads.  There was no flank pain with palpation.  Patient has increased tenderness to the right lateral rib area with any palpation whatsoever.  However, there is no increased pain with movement.  Chest x-ray and right rib x-ray obtained today revealed: IMPRESSION: No acute cardiopulmonary disease.  Chronic healed fractures right second and fifth ribs anteriorly.  Right apical pleural density is similar prior studies and may be due to benign pleural thickening.  Based on the prior CT there could be myeloma in the right second rib however no bony destruction is identified on the current study.  Review these findings with Dr. Jana Hakim; and he feels that this right lateral rib pain may very well be myeloma.  The plan is to review these findings with radiation oncology to see if there is a possibility of radiation treatments to the site.  Also, patient was given a prescription for tramadol to take for his pain.  He was advised to avoid the Mobic, ibuprofen, Advil, aspirin, Aleve, or Naprosyn.

## 2015-01-23 NOTE — Assessment & Plan Note (Signed)
Patient reports chronic nausea and intermittent vomiting recently.  He feels dehydrated today.  Patient will be transported to the emergency department for further evaluation and management.

## 2015-01-23 NOTE — Progress Notes (Signed)
SYMPTOM MANAGEMENT CLINIC   HPI: Charles [name] 39 y.o. male diagnosed with multiple myeloma; with bone metastasis.  Currently undergoing Revlimid oral therapy and Zometa infusions.  Patient was seen by urologist, Dr. Vikki Ports on 01/09/2015; and diagnosed with apparent prostatitis.  Patient was prescribed Cipro and Mobic.  Patient states that he was instructed to take the Cipro antibiotics for a total of 30 days.  Patient developed a rash after taking the antibiotics for approximately one week.  He states the rash is only occasionally pruritic.  Exam reveals rash/hives to entire anterior neck with some radiation of the rash to the areas behind his ears.  He also has the same rash to his bilateral forearms as well.  Patient has no other allergic-type symptoms whatsoever.  Patient is also reporting a new onset of right lateral rib pain for the past several days.  He denies any chest pain, chest pressure, shortness breath, or pain with inspiration.  He denies any recent fevers or chills.   Urinary Tract Infection   Rash  Abdominal Pain    Review of Systems  Gastrointestinal: Positive for abdominal pain.  Skin: Positive for rash.    Past Medical History  Diagnosis Date  . 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 CKD (chronic kidney disease), stage III   . Hypercholesterolemia   . Lytic bone lesions on xray     History reviewed. No pertinent past surgical history.  has Lytic bone lesions on xray; Multiple myeloma (HCC); CKD (chronic kidney disease) stage 3, GFR 30-59 ml/min; Hordeolum externum of right eye; Prostatitis; Hypoalbuminemia due to protein-calorie malnutrition ([place]); Dehydration; Rash; Cancer associated pain; Nausea with vomiting; Antineoplastic chemotherapy induced anemia; ARF (acute renal failure) (HCC); and AKI (acute kidney injury) ([place]) on his problem list.    is allergic to ciprofloxacin and mobic.    Medication List       This list is accurate as of: 01/22/15  1:13 PM.   Always use your most recent med list.               acyclovir 400 MG tablet  Commonly known as:  ZOVIRAX  TAKE 1 TABLET BY MOUTH 5 TIMES A DAY     diphenhydrAMINE 25 MG tablet  Commonly known as:  SOMINEX  Take 25 mg by mouth at bedtime as needed for allergies.     erythromycin ophthalmic ointment  Place 1 application into the right eye 4 (four) times daily.     fluconazole 100 MG tablet  Commonly known as:  DIFLUCAN  Take 1 tablet (100 mg total) by mouth daily.     lenalidomide 10 MG capsule  Commonly known as:  REVLIMID  Take 1 capsule (10 mg total) by mouth daily. Take 1 daily for 21 days, then off for 7 days     meloxicam 15 MG tablet  Commonly known as:  MOBIC  Take 15 mg by mouth at bedtime.     methylPREDNISolone 4 MG Tbpk tablet  Commonly known as:  MEDROL DOSEPAK  Medrol dose pak: take as directed.     nitrofurantoin (macrocrystal-monohydrate) 100 MG capsule  Commonly known as:  MACROBID  Take 1 capsule (100 mg total) by mouth 2 (two) times daily.     ondansetron 8 MG tablet  Commonly known as:  ZOFRAN  Take 1 tablet (8 mg total) by mouth every 8 (eight) hours as needed for nausea or vomiting.     traMADol 50 MG tablet  Commonly known as:  Veatrice Bourbon  Take 1 tablet (50 mg total) by mouth every 6 (six) hours as needed.         PHYSICAL EXAMINATION  Oncology Vitals 01/23/2015 01/23/2015  Height - -  Weight - -  Weight (lbs) - -  BMI (kg/m2) - -  Temp 98 98.1  Pulse 71 67  Resp 20 18  SpO2 99 97  BSA (m2) - -   BP Readings from Last 2 Encounters:  01/23/15 119/64  01/22/15 127/73    Physical Exam  Constitutional: He is oriented to person, place, and time. Vital signs are normal. He appears unhealthy.  HENT:  Head: Normocephalic and atraumatic.  Mouth/Throat: Oropharynx is clear and moist.  Eyes: Conjunctivae and EOM are normal. Pupils are equal, round, and reactive to light. Right eye exhibits no discharge. Left eye exhibits no discharge. No  scleral icterus.  Neck: Normal range of motion. Neck supple. No JVD present. No tracheal deviation present. No thyromegaly present.  Cardiovascular: Normal rate, regular rhythm, normal heart sounds and intact distal pulses.   Pulmonary/Chest: Effort normal and breath sounds normal. No respiratory distress. He has no wheezes. He has no rales. He exhibits tenderness.  Right lateral rib tenderness with any palpation.  No obvious injury or trauma to the site.  Abdominal: Soft. Bowel sounds are normal. He exhibits no distension and no mass. There is no tenderness. There is no rebound and no guarding.  Genitourinary:  No flank pain.  Musculoskeletal: Normal range of motion. He exhibits no edema or tenderness.  Lymphadenopathy:    He has no cervical adenopathy.  Neurological: He is alert and oriented to person, place, and time. Gait normal.  Skin: Skin is warm and dry. Rash noted. No erythema. There is pallor.  Patient has rash/hives to his anterior neck region; some radiation of the rash to the areas directly behind his years as well.  Also has the same rash to his bilateral forearms.  Psychiatric: Affect normal.  Nursing note and vitals reviewed.  you should follow along with a will  LABORATORY DATA:. Admission on 01/22/2015  Component Date Value Ref Range Status  . ANA Ab, IFA 01/22/2015 Negative   Final   Comment: (NOTE)                                     Negative   <1:80                                     Borderline  1:80                                     Positive   >1:80 Performed At: [facility] [place], [place] [id number] Lindon Romp MD WU:[id number]   . Myeloperoxidase Abs 01/22/2015 <9.0  0.0 - 9.0 U/mL Final  . ANCA Proteinase 3 01/22/2015 <3.5  0.0 - 3.5 U/mL Corrected   Comment: (NOTE) Performed At: [facility] [place], [place] [id number] Lindon Romp MD VH:[id number]   . C3 Complement 01/22/2015 78* 82 - 167  mg/dL Final   Comment: (NOTE) Performed At: [facility] [place], [place] [id number] Lindon Romp MD UV:[id number]   . Complement C4, Body Fluid 01/22/2015 18  14 - 44 mg/dL Final   Comment: (NOTE) Performed At: [facility] [place], [place] [id number] Lindon Romp MD [id number]   . GBM Ab 01/22/2015 4  0 - 20 units Final   Comment: (NOTE)                   Negative                   0 - 20                   Weak Positive             21 - 30                   Moderate to Strong Positive   >30 Performed At: [facility] [place], [place] [id number] Lindon Romp MD [id number]   . WBC 01/23/2015 7.4  4.0 - 10.5 K/uL Final  . RBC 01/23/2015 2.46* 4.22 - 5.81 MIL/uL Final  . Hemoglobin 01/23/2015 7.7* 13.0 - 17.0 g/dL Final  . HCT 01/23/2015 24.2* 39.0 - 52.0 % Final  . MCV 01/23/2015 98.4  78.0 - 100.0 fL Final  . MCH 01/23/2015 31.3  26.0 - 34.0 pg Final  . MCHC 01/23/2015 31.8  30.0 - 36.0 g/dL Final  . RDW 01/23/2015 15.1  11.5 - 15.5 % Final  . Platelets 01/23/2015 346  150 - 400 K/uL Final  . Creatinine, Urine 01/22/2015 88.85   Final  . Total Protein, Urine 01/22/2015 190   Final   Comment: RESULTS CONFIRMED BY MANUAL DILUTION NO NORMAL RANGE ESTABLISHED FOR THIS TEST   . Protein Creatinine Ratio 01/22/2015 2.14* 0.00 - 0.15 mg/mg[Cre] Final  . Sodium 01/23/2015 142  135 - 145 mmol/L Final  . Potassium 01/23/2015 4.5  3.5 - 5.1 mmol/L Final  . Chloride 01/23/2015 108  101 - 111 mmol/L Final  . CO2 01/23/2015 24  22 - 32 mmol/L Final  . Glucose, Bld 01/23/2015 128* 65 - 99 mg/dL Final  . BUN 01/23/2015 60* 6 - 20 mg/dL Final  . Creatinine, Ser 01/23/2015 5.36* 0.61 - 1.24 mg/dL Final  . Calcium 01/23/2015 7.7* 8.9 - 10.3 mg/dL Final  . Phosphorus 01/23/2015 6.9* 2.5 - 4.6 mg/dL Final  . Albumin 01/23/2015 1.6* 3.5 - 5.0 g/dL Final  . GFR calc non Af Amer 01/23/2015 12* >60 mL/min Final   . GFR calc Af Amer 01/23/2015 14* >60 mL/min Final   Comment: (NOTE) The eGFR has been calculated using the CKD EPI equation. This calculation has not been validated in all clinical situations. eGFR's persistently <60 mL/min signify possible Chronic Kidney Disease.   . Anion gap 01/23/2015 10  5 - 15 Final  . PSA 01/23/2015 3.48  0.00 - 4.00 ng/mL Final   Comment: (NOTE) While PSA levels of <=4.0 ng/ml are reported as reference range, some men with levels below 4.0 ng/ml can have prostate cancer and many men with PSA above 4.0 ng/ml do not have prostate cancer.  Other tests such as free PSA, age specific reference ranges, PSA velocity and PSA doubling time may be helpful especially in men less than 90 years old.   Appointment on 01/22/2015  Component Date Value Ref Range Status  . WBC 01/22/2015 8.6  4.0 - 10.3 10e3/uL Final  . NEUT# 01/22/2015 7.2* 1.5 - 6.5 10e3/uL Final  . HGB 01/22/2015 8.9* 13.0 - 17.1 g/dL Final  .  HCT 01/22/2015 27.7* 38.4 - 49.9 % Final  . Platelets 01/22/2015 333  140 - 400 10e3/uL Final  . MCV 01/22/2015 97.9  79.3 - 98.0 fL Final  . MCH 01/22/2015 31.6  27.2 - 33.4 pg Final  . MCHC 01/22/2015 32.3  32.0 - 36.0 g/dL Final  . RBC 01/22/2015 2.83* 4.20 - 5.82 10e6/uL Final  . RDW 01/22/2015 15.4* 11.0 - 14.6 % Final  . lymph# 01/22/2015 0.9  0.9 - 3.3 10e3/uL Final  . MONO# 01/22/2015 0.5  0.1 - 0.9 10e3/uL Final  . Eosinophils Absolute 01/22/2015 0.0  0.0 - 0.5 10e3/uL Final  . Basophils Absolute 01/22/2015 0.0  0.0 - 0.1 10e3/uL Final  . NEUT% 01/22/2015 83.4* 39.0 - 75.0 % Final  . LYMPH% 01/22/2015 10.0* 14.0 - 49.0 % Final  . MONO% 01/22/2015 6.3  0.0 - 14.0 % Final  . EOS% 01/22/2015 0.1  0.0 - 7.0 % Final  . BASO% 01/22/2015 0.2  0.0 - 2.0 % Final  . Sodium 01/22/2015 142  136 - 145 mEq/L Final  . Potassium 01/22/2015 3.9  3.5 - 5.1 mEq/L Final  . Chloride 01/22/2015 107  98 - 109 mEq/L Final  . CO2 01/22/2015 22  22 - 29 mEq/L Final  .  Glucose 01/22/2015 128  70 - 140 mg/dl Final   Glucose reference range is for nonfasting patients. Fasting glucose reference range is 70- 100.  Marland Kitchen BUN 01/22/2015 51.2* 7.0 - 26.0 mg/dL Final  . Creatinine 01/22/2015 5.3* 0.7 - 1.3 mg/dL Final  . Total Bilirubin 01/22/2015 <0.30  0.20 - 1.20 mg/dL Final  . Alkaline Phosphatase 01/22/2015 191* 40 - 150 U/L Final  . AST 01/22/2015 20  5 - 34 U/L Final  . ALT 01/22/2015 25  0 - 55 U/L Final  . Total Protein 01/22/2015 7.6  6.4 - 8.3 g/dL Final  . Albumin 01/22/2015 2.0* 3.5 - 5.0 g/dL Final  . Calcium 01/22/2015 9.3  8.4 - 10.4 mg/dL Final  . Anion Gap 01/22/2015 13* 3 - 11 mEq/L Final  . EGFR 01/22/2015 13* >90 ml/min/1.73 m2 Final   eGFR is calculated using the CKD-EPI Creatinine Equation (2009)  . Glucose 01/22/2015 Negative  Negative mg/dL Final  . Bilirubin (Urine) 01/22/2015 Negative  Negative Final  . Ketones 01/22/2015 Negative  Negative mg/dL Final  . Specific Gravity, Urine 01/22/2015 1.030  1.003 - 1.035 Final  . Blood 01/22/2015 Large  Negative Final  . pH 01/22/2015 5.0  4.6 - 8.0 Final  . Protein 01/22/2015 2000  Negative- <30 mg/dL Final  . Urobilinogen, UR 01/22/2015 0.2  0.2 - 1 mg/dL Final  . Nitrite 01/22/2015 Negative  Negative Final  . Leukocyte Esterase 01/22/2015 Color Interference due to blood in urine  Negative Final  . RBC / HPF 01/22/2015 TNTC  0 - 2 Final  . WBC, UA 01/22/2015 11-20  0 - 2 Final  . Bacteria, UA 01/22/2015 Moderate  Negative- Trace Final  . Casts 01/22/2015 mod casts, some granular, some RBC casts  Negative Final  . Urine Culture, Routine 01/22/2015 Culture, Urine   Final   Comment: Final - ===== COLONY COUNT: ===== NO GROWTH NO GROWTH   Appointment on 01/20/2015  Component Date Value Ref Range Status  . WBC 01/20/2015 7.2  4.0 - 10.3 10e3/uL Final  . NEUT# 01/20/2015 5.1  1.5 - 6.5 10e3/uL Final  . HGB 01/20/2015 8.9* 13.0 - 17.1 g/dL Final  . HCT 01/20/2015 27.3* 38.4 - 49.9 % Final  .  Platelets 01/20/2015 333  140 - 400 10e3/uL Final  . MCV 01/20/2015 96.8  79.3 - 98.0 fL Final  . MCH 01/20/2015 31.6  27.2 - 33.4 pg Final  . MCHC 01/20/2015 32.6  32.0 - 36.0 g/dL Final  . RBC 01/20/2015 2.82* 4.20 - 5.82 10e6/uL Final  . RDW 01/20/2015 14.9* 11.0 - 14.6 % Final  . lymph# 01/20/2015 1.5  0.9 - 3.3 10e3/uL Final  . MONO# 01/20/2015 0.4  0.1 - 0.9 10e3/uL Final  . Eosinophils Absolute 01/20/2015 0.2  0.0 - 0.5 10e3/uL Final  . Basophils Absolute 01/20/2015 0.0  0.0 - 0.1 10e3/uL Final  . NEUT% 01/20/2015 70.4  39.0 - 75.0 % Final  . LYMPH% 01/20/2015 20.5  14.0 - 49.0 % Final  . MONO% 01/20/2015 6.1  0.0 - 14.0 % Final  . EOS% 01/20/2015 2.6  0.0 - 7.0 % Final  . BASO% 01/20/2015 0.4  0.0 - 2.0 % Final  . nRBC 01/20/2015 0  0 - 0 % Final  . Sodium 01/20/2015 138  136 - 145 mEq/L Final  . Potassium 01/20/2015 3.4* 3.5 - 5.1 mEq/L Final  . Chloride 01/20/2015 103  98 - 109 mEq/L Final  . CO2 01/20/2015 24  22 - 29 mEq/L Final  . Glucose 01/20/2015 99  70 - 140 mg/dl Final   Glucose reference range is for nonfasting patients. Fasting glucose reference range is 70- 100.  Marland Kitchen BUN 01/20/2015 21.9  7.0 - 26.0 mg/dL Final  . Creatinine 01/20/2015 3.1* 0.7 - 1.3 mg/dL Final  . Total Bilirubin 01/20/2015 0.42  0.20 - 1.20 mg/dL Final  . Alkaline Phosphatase 01/20/2015 150  40 - 150 U/L Final  . AST 01/20/2015 22  5 - 34 U/L Final  . ALT 01/20/2015 22  0 - 55 U/L Final  . Total Protein 01/20/2015 7.9  6.4 - 8.3 g/dL Final  . Albumin 01/20/2015 2.0* 3.5 - 5.0 g/dL Final  . Calcium 01/20/2015 9.5  8.4 - 10.4 mg/dL Final  . Anion Gap 01/20/2015 11  3 - 11 mEq/L Final  . EGFR 01/20/2015 24* >90 ml/min/1.73 m2 Final   eGFR is calculated using the CKD-EPI Creatinine Equation (2009)  . IgG (Immunoglobin G), Serum 01/20/2015 1680* 650 - 1600 mg/dL Final  . IgA 01/20/2015 164  68 - 379 mg/dL Final  . IgM, Serum 01/20/2015 48  41 - 251 mg/dL Final  . Kappa free light chain 01/20/2015  12.40* 0.33 - 1.94 mg/dL Final  . Lambda Free Lght Chn 01/20/2015 2.81* 0.57 - 2.63 mg/dL Final  . Kappa:Lambda Ratio 01/20/2015 4.41* 0.26 - 1.65 Final  . Creatinine, Urine 01/20/2015 205  20 - 370 mg/dL Final  . Total Protein, Urine 01/20/2015 670* 5 - 25 mg/dL Final   Comment: Result repeated and verified. Result confirmed by automatic dilution.   . Protein Creatinine Ratio 01/20/2015 3268* 22 - 128 mg/g creat Final  . Total Protein, Serum Electrophores* 01/20/2015 6.9  6.1 - 8.1 g/dL Final  . Albumin ELP 01/20/2015 2.2* 3.8 - 4.8 g/dL Final  . Alpha-1-Globulin 01/20/2015 0.7* 0.2 - 0.3 g/dL Final  . Alpha-2-Globulin 01/20/2015 1.7* 0.5 - 0.9 g/dL Final  . Beta Globulin 01/20/2015 0.4  0.4 - 0.6 g/dL Final  . Beta 2 01/20/2015 0.3  0.2 - 0.5 g/dL Final  . Gamma Globulin 01/20/2015 1.6  0.8 - 1.7 g/dL Final  . Abnormal Protein Band1 01/20/2015 1.2   Final  . SPE Interp. 01/20/2015 *   Final  Comment: A restricted band consistent with monoclonal protein is present. The monoclonal protein peak accounts for 1.2 g/dL of the total 1.5 g/dL of protein in the gamma region.  Results are consistent with SPE performed on 01/02/15  The possibility of another faint restricted band (s) cannot be completely excluded in the gamma region.  Suggest serum IFE to evaluate possibility, if clinically indicated. (Lab will hold sample one week.)  Reviewed by Odis Hollingshead, MD, PhD, FCAP (Electronic Signature on File)   . COMMENT (PROTEIN ELECTROPHOR) 01/20/2015 *   Final   Comment: --------------- Serum protein electrophoresis is a useful screening procedure in the detection of various pathophysiologic states such as inflammation, gammopathies, protein loss and other dysproteinemias.  Immunofixation electrophoresis (IFE) is a more sensitive technique for the identification of M-proteins found in patients with monoclonal gammopathy of unknown significance (MGUS), amyloidosis, early or treated  myeloma or macroglobulinemia, solitary plasmacytoma or extramedullary plasmacytoma.   . Abnormal Protein Band2 01/20/2015 NOT DET   Final  . Abnormal Protein Band3 01/20/2015 NOT DET   Final  . Beta-2 Microglobulin 01/20/2015 8.91* <=2.51 mg/L Final     RADIOGRAPHIC STUDIES: Dg Ribs Unilateral W/chest Right  01/20/2015  CLINICAL DATA:  Multiple myeloma with right-sided chest pain EXAM: RIGHT RIBS AND CHEST - 3+ VIEW COMPARISON:  CT chest 02/03/2014. Chest CT 01/30/2014. Chest x-ray 01/05/2014 FINDINGS: Heart size within normal limits. Negative for heart failure. Lungs are clear without infiltrate or effusion Extrapleural soft tissue thickening in the right lung apex. This has been present on prior studies and may be due to scarring. There is a chronic healed fracture of the right anterior second rib which was noted on prior studies. The right second rib also shows abnormal infiltrative low density on CT suggestive of myeloma. No definite bony destruction is seen on the current study. Chronic fracture of the right anterior fifth rib unchanged from prior studies. Negative for acute fracture. IMPRESSION: No acute cardiopulmonary disease. Chronic healed fractures right second and fifth ribs anteriorly. Right apical pleural density is similar prior studies and may be due to benign pleural thickening. Based on the prior CT there could be myeloma in the right second rib however no bony destruction is identified on the current study. Electronically Signed   By: Franchot Gallo M.D.   On: 01/20/2015 15:27   US Renal  01/22/2015  CLINICAL DATA:  Renal failure.  Chronic kidney disease stage 3. EXAM: RENAL / URINARY TRACT ULTRASOUND COMPLETE COMPARISON:  01/01/2015 FINDINGS: Right Kidney: Length: 11.1. Increased parenchymal echogenicity. No mass or hydronephrosis visualized. Left Kidney: Length: 12.2 cm. Increased parenchymal echogenicity. There is a cyst arising from the upper pole measuring 1.1 x 1.2 x 0.9 cm  peer no mass or hydronephrosis. Bladder: Appears normal for degree of bladder distention. IMPRESSION: 1. No acute findings 2. Bilateral echogenic kidneys compatible with chronic medical renal disease. 3. Left kidney cyst. Electronically Signed   By: Kerby Moors M.D.   On: 01/22/2015 14:23    ASSESSMENT/PLAN:    Rash Patient was seen by urologist, Dr. Vikki Ports on 01/09/2015; and diagnosed with apparent prostatitis.  Patient was prescribed Cipro and Mobic.  Patient states that he was instructed to take the Cipro antibiotics for a total of 30 days.  Patient developed a rash after taking the antibiotics for approximately one week.  He states the rash is only occasionally pruritic.  Exam reveals rash/hives to entire anterior neck with some radiation of the rash to the areas behind his  ears.  He also has the same rash to his bilateral forearms as well.  Patient has no other allergic-type symptoms whatsoever.  After discussing patient's symptoms with Dr. Tresa Moore urologist-patient was instructed to discontinue both the Cipro and the Mobic.   Reviewed all findings with Dr. Jana Hakim as well- and Dr. Jana Hakim.  Advised patient take Macrobid antibiotics instead for a total of 30 days for treatment of the chronic prostatitis issues.  Will place, both Cipro and Modic on patient's allergy list.  Also, patient was given instructions to take Benadryl 25 mg every 6 hours and Pepcid 20 mg every 12 hours for treatment of rash.  He was also given a prescription for Medrol dose pack as well.  Patient was advised to go directed to the emergency department for any worsening hypersensitivity reactions whatsoever.   ______________________________________  Update: Patient's rash to his anterior neck and bilateral forearms has greatly improved within the past 48 hours.  Patient confirmed that he is still taking the Benadryl and Pepcid as directed.  He is also continuing with the Medrol Dosepak .  He was prescribed as well.   He denies any other new hypersensitivity reaction symptoms.    Prostatitis Patient has had chronic mild dysuria and gross hematuria for the past several weeks.  Patient has been treated with antibiotics on multiple occasions with no improvement.  Patient was recently sent to the emergency department for further evaluation and management of the same issue.  He was then seen by Dr. Valora Corporal urologist on 01/09/2015.  He was apparently diagnosed with chronic prostatitis; and given antibiotics for treatment.  Patient continues to complain of some mild dysuria; and gross hematuria.  He denies any recent fevers or chills.  See further notes regarding apparent allergic reaction to Cipro antibiotics given to the patient for treatment of his prostatitis.  In regards to patient's prostatitis diagnosis-patient was encouraged to follow back up with his urologist as directed. _______________________________________  Update: Patient presented back to the [facility] today for a recheck.  Patient states he is feeling some better; but continues with nausea and intermittent vomiting.  He continues to feel dehydrated.  He also continues to complain of some very mild dysuria and gross hematuria.  Urinalysis obtained today did reveal a large amount of blood, protein 2000, RBCs too numerous to count, WBCs 11-20, and a moderate amount of bacteria.  Also, urine was noted to have positive red cell casts.  Also, creatinine has increased from baseline of 1.0 for up to 5.3.  EGFR today was 13.  Reviewed all of these findings with Dr. Jana Hakim in detail; and he suggested that patient go directly to [facility] Emergency Department for further evaluation and management.  Dr. 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  However, Dr. Jana Hakim advised that it is fairly rare for myeloma to damage the kidney.  He feels that patient will need a  full urological consult; and perhaps a urological procedure for further evaluation. (Of note- CT with contrast abd/pelvis and renal US were obtained within past few weeks with no obvious acute findings).   Due to the continuation/progression of all of these symptoms.- Patient will go directly to [facility] emergency department for further evaluation and management.  Patient was given a printed note instructing him to report to the [facility] emergency department charge nurse.  Also, brief history and report were given to the [facility] emergency department charge  nurse as well.  Jamesetta So, RN Charge in ED advised would hold a room for the patient in the emergency department.  Advised the colon emergency department nurse that patient was undergoing active oral chemotherapy at this time; and would need to avoid contact with multiple patients at all possible.  Also, advised the [facility] emergency department charge nurse that patient would need a new translator to assist them as well.   Nausea with vomiting Patient continues to complain of some mild nausea; and has had some intermittent vomiting as well.  He has anti--nausea medications home to take as directed.  Multiple myeloma ([place]) Patient continues to take his Revlimid oral therapy as directed.  He last received Zometa infusion on 11/14/2014.  Patient was seen earlier this week with complaint of acute onset severe pain to his right lateral rib area.  Chest/rib x-ray revealed healed rib fracture; but no acute issues are bony destruction.  Patient is scheduled to return 02/05/2015 for labs, visit, and his next Zometa infusion. ---------------------------------------------------------------------------------  Update: Blood counts obtained today revealed a WBC of 8.6, ANC 7.2, hemoglobin 8.9, platelet count 333.  Due to patient's continued right lateral rib discomfort and progressive renal insufficiency-patient will be transported to the emergency department for  further evaluation and management.  Patient is scheduled to return on 02/05/2015 for labs, visit, and a Zometa infusion.                Hypoalbuminemia due to protein-calorie malnutrition (HCC) Albumin has decreased down to 2.0.  Patient was encouraged to push protein in his diet is much as possible.  Dehydration Patient reports chronic nausea and intermittent vomiting recently.  He feels dehydrated today.  Patient will be transported to the emergency department for further evaluation and management.  CKD (chronic kidney disease) stage 3, GFR 30-59 ml/min Patient has a history of chronic renal insufficiency; and creatinine has now increased to 5.3.  Patient will be transported to the emergency department for further evaluation and management.  Cancer associated pain Patient presents to the [facility] today with severe pain to his right lateral rib area.  He denies any known injury or trauma to the area.  He denies any chest pain, chest pressure, shortness breath, or pain with inspiration.  Exam revealed abdomen was soft and nontender in all quads.  Thousand positive in all 4 quads.  There was no flank pain with palpation.  Patient has increased tenderness to the right lateral rib area with any palpation whatsoever.  However, there is no increased pain with movement.  Chest x-ray and right rib x-ray obtained today revealed: IMPRESSION: No acute cardiopulmonary disease.  Chronic healed fractures right second and fifth ribs anteriorly.  Right apical pleural density is similar prior studies and may be due to benign pleural thickening.  Based on the prior CT there could be myeloma in the right second rib however no bony destruction is identified on the current study.  Review these findings with Dr. Jana Hakim; and he feels that this right lateral rib pain may very well be myeloma.  The plan is to review these findings with radiation oncology to see if there is a  possibility of radiation treatments to the site.  Also, patient was given a prescription for tramadol to take for his pain.  He was advised to avoid the Mobic, ibuprofen, Advil, aspirin, Aleve, or Naprosyn.      Antineoplastic chemotherapy induced anemia Hemoglobin decreased to 8.9 today.  Most likely, this is Secondary to  patient's chemotherapy.  Patient denies any worsening fatigue or shortness of breath with exertion.  We'll continue to monitor closely.       Patient stated understanding of all instructions; and was in agreement with this plan of care. The patient knows to call the clinic with any problems, questions or concerns.   Review/collaboration with Dr. Jana Hakim regarding all aspects of patient's visit today.   Total time spent with patient was 40 minutes;  with greater than 75 percent of that time spent in face to face counseling regarding patient's symptoms,  and coordination of care and follow up.  Disclaimer:This dictation was prepared with Dragon/digital dictation along with [employer]. Any transcriptional errors that result from this process are unintentional.  Drue Second, NP 01/23/2015

## 2015-01-23 NOTE — Assessment & Plan Note (Addendum)
Albumin has decreased down to 2.0.  Patient was encouraged to push protein in his diet is much as possible.

## 2015-01-23 NOTE — Progress Notes (Signed)
Subjective:  800 of UOP this AM- creatinine stable- not uremic- asking about going home Objective Vital signs in last 24 hours: Filed Vitals:   01/22/15 1606 01/22/15 2111 01/23/15 0519 01/23/15 1005  BP: 128/71 111/63 109/63 119/64  Pulse: 67 66 67 71  Temp: 98 F (36.7 C) 97.6 F (36.4 C) 98.1 F (36.7 C) 98 F (36.7 C)  TempSrc: Oral Oral Oral Oral  Resp: '18 18 18 20  '$ Height: '5\' 3"'$  (1.6 m)     Weight: 71.714 kg (158 lb 1.6 oz) 71.668 kg (158 lb)    SpO2: 99% 99% 97% 99%   Weight change:   Intake/Output Summary (Last 24 hours) at 01/23/15 1314 Last data filed at 01/23/15 1153  Gross per 24 hour  Intake    740 ml  Output    900 ml  Net   -160 ml    Assessment/ Plan: Pt is a 39 y.o. yo male who was admitted on 01/22/2015 with worsening renal function (crt 1.04 on 12/6, then 3.1 on 12/20) and  and hematuria in the setting of undergoing treatment for myeloma - being started on cipro and mobic---> rash Assessment/Plan: 1. Renal-  AKI- differential thought to be AIN- analgesic nephropathy vs worsening myeloma- less likely to be a vasculitis as hematuria predated worsening of renal function- so far neg ANCA, ANA- comp low normal for c4 and low for c3- protein to creatinine ratio is 2.1 actually better than before- urology has been asked to see to look at bladder cause of hematuria- getting hydrated- U/S on 12/22 neg for obstruction- been placed on steroids- cipro and mobic stopped.  Creatinine stable - good UOP- may eventually need kidney biopsy if does not improve but hope it does improve 2. HTN/volume- stable, no meds 3. Anemia- low likely mostly due to malignancy- per hem 4. Meds- will lower acyclovir for renal dosing    Koji Niehoff A    Labs: Basic Metabolic Panel:  Recent Labs Lab 01/20/15 0952 01/22/15 1018 01/23/15 0554  NA 138 142 142  K 3.4* 3.9 4.5  CL  --   --  108  CO2 '24 22 24  '$ GLUCOSE 99 128 128*  BUN 21.9 51.2* 60*  CREATININE 3.1* 5.3* 5.36*   CALCIUM 9.5 9.3 7.7*  PHOS  --   --  6.9*   Liver Function Tests:  Recent Labs Lab 01/20/15 0952 01/22/15 1018 01/23/15 0554  AST 22 20  --   ALT 22 25  --   ALKPHOS 150 191*  --   BILITOT 0.42 <0.30  --   PROT 7.9 7.6  --   ALBUMIN 2.0* 2.0* 1.6*   No results for input(s): LIPASE, AMYLASE in the last 168 hours. No results for input(s): AMMONIA in the last 168 hours. CBC:  Recent Labs Lab 01/20/15 0952 01/22/15 1018 01/23/15 0554  WBC 7.2 8.6 7.4  NEUTROABS 5.1 7.2*  --   HGB 8.9* 8.9* 7.7*  HCT 27.3* 27.7* 24.2*  MCV 96.8 97.9 98.4  PLT 333 333 346   Cardiac Enzymes: No results for input(s): CKTOTAL, CKMB, CKMBINDEX, TROPONINI in the last 168 hours. CBG: No results for input(s): GLUCAP in the last 168 hours.  Iron Studies: No results for input(s): IRON, TIBC, TRANSFERRIN, FERRITIN in the last 72 hours. Studies/Results: US Renal  01/22/2015  CLINICAL DATA:  Renal failure.  Chronic kidney disease stage 3. EXAM: RENAL / URINARY TRACT ULTRASOUND COMPLETE COMPARISON:  01/01/2015 FINDINGS: Right Kidney: Length: 11.1. Increased parenchymal echogenicity. No  mass or hydronephrosis visualized. Left Kidney: Length: 12.2 cm. Increased parenchymal echogenicity. There is a cyst arising from the upper pole measuring 1.1 x 1.2 x 0.9 cm peer no mass or hydronephrosis. Bladder: Appears normal for degree of bladder distention. IMPRESSION: 1. No acute findings 2. Bilateral echogenic kidneys compatible with chronic medical renal disease. 3. Left kidney cyst. Electronically Signed   By: Kerby Moors M.D.   On: 01/22/2015 14:23   Medications: Infusions: . sodium chloride 150 mL/hr at 01/23/15 1150    Scheduled Medications: . acyclovir  400 mg Oral BID  . dexamethasone  10 mg Intravenous 4 times per day  . feeding supplement  1 Container Oral BID BM  . heparin  5,000 Units Subcutaneous 3 times per day    have reviewed scheduled and prn medications.  Physical Exam: General:  NAD Heart: RRR Lungs: clear Abdomen: soft, non tender Extremities: no edema    01/23/2015,1:14 PM  LOS: 1 day

## 2015-01-23 NOTE — Assessment & Plan Note (Signed)
Patient has a history of chronic renal insufficiency; and creatinine has now increased to 5.3.  Patient will be transported to the emergency department for further evaluation and management.

## 2015-01-23 NOTE — Progress Notes (Signed)
Initial Nutrition Assessment  INTERVENTION:   Boost Breeze po BID, each supplement provides 250 kcal and 9 grams of protein  NUTRITION DIAGNOSIS:   Inadequate oral intake related to poor appetite as evidenced by per patient/family report.  GOAL:   Patient will meet greater than or equal to 90% of their needs  MONITOR:   PO intake, Supplement acceptance, Labs, Weight trends  REASON FOR ASSESSMENT:   Malnutrition Screening Tool   ASSESSMENT:   [name] is a 39yo Spanish-speaking M with PMH of multiple myeloma (stage IIA diagnosed January 2016 initially on CyBorD, now on lenalidomide and zoledronic acid) who presents to the ED after being sent from his oncology clinic for acutely worsening renal function. Starting one month ago, he started having worsening malaise, fatigue, nausea with occasional vomiting and painless gross hematuria. He was referred to Dr. Vikki Ports in urology on 01/09/2015, where he was diagnosed with prostatitis   Medications reviewed and include: decadron Labs reviewed: BUN/Cr elevated (60/5.36), phosphorus elevated 6.9 CBG: 128 Meal completion 100% Per pt he has lost 10 lb in the last month, having some pain and decreased appetite. Chart review reveals weight range of 150-160 lb.  Ate well this am. Would be willing to try Breeze.  Exam WNL except some mild depletion in calf.   Diet Order:  Diet regular Room service appropriate?: Yes; Fluid consistency:: Thin  Skin:  Reviewed, no issues  Last BM:  12/22  Height:   Ht Readings from Last 1 Encounters:  01/22/15 _0  (1.6 m)   Weight:   Wt Readings from Last 1 Encounters:  01/22/15 158 lb (71.668 kg)   Ideal Body Weight:  56.3 kg  BMI:  Body mass index is 28 kg/(m^2).  Estimated Nutritional Needs:   Kcal:  1800-2000  Protein:  90-100 grams  Fluid:  > 1.8 L/day  EDUCATION NEEDS:   No education needs identified at this time  [place], [place], [place]  Pager [phone] After Hours Pager

## 2015-01-23 NOTE — Assessment & Plan Note (Signed)
Hemoglobin decreased to 8.9 today.  Most likely, this is Secondary to patient's chemotherapy.  Patient denies any worsening fatigue or shortness of breath with exertion.  We'll continue to monitor closely.

## 2015-01-23 NOTE — Progress Notes (Signed)
Subjective: Patient seen and examined this morning on rounds.  No acute events overnight.  Patient's wife present in the room with patient.  Interval history in part obtained with her assistance.  Patient with no new complaints this morning.  Still with hematuria, rib cage pain present but better.  Objective: Vital signs in last 24 hours: Filed Vitals:   01/22/15 1606 01/22/15 2111 01/23/15 0519 01/23/15 1005  BP: 128/71 111/63 109/63 119/64  Pulse: 67 66 67 71  Temp: 98 F (36.7 C) 97.6 F (36.4 C) 98.1 F (36.7 C) 98 F (36.7 C)  TempSrc: Oral Oral Oral Oral  Resp: 18 18 18 20   Height: 5' 3"  (1.6 m)     Weight: 158 lb 1.6 oz (71.714 kg) 158 lb (71.668 kg)    SpO2: 99% 99% 97% 99%   Weight change:   Intake/Output Summary (Last 24 hours) at 01/23/15 1208 Last data filed at 01/23/15 1153  Gross per 24 hour  Intake    740 ml  Output    900 ml  Net   -160 ml   General: sitting up in bed, no distress HEENT: EOMI, no scleral icterus Cardiac: RRR, no rubs, murmurs or gallops Pulm: clear to auscultation bilaterally, moving normal volumes of air Abd: soft, nontender, nondistended MSK: pain ellicited with CVA tenderness testing on lateral right ribs Ext: warm and well perfused, no pedal edema, mild maculopapular rash on bilateral upper extremities Neuro: alert and oriented X3, no focal deficits  Lab Results: Basic Metabolic Panel:  Recent Labs Lab 01/22/15 1018 01/23/15 0554  NA 142 142  K 3.9 4.5  CL  --  108  CO2 22 24  GLUCOSE 128 128*  BUN 51.2* 60*  CREATININE 5.3* 5.36*  CALCIUM 9.3 7.7*  PHOS  --  6.9*   Liver Function Tests:  Recent Labs Lab 01/20/15 0952 01/22/15 1018 01/23/15 0554  AST 22 20  --   ALT 22 25  --   ALKPHOS 150 191*  --   BILITOT 0.42 <0.30  --   PROT 7.9 7.6  --   ALBUMIN 2.0* 2.0* 1.6*   No results for input(s): LIPASE, AMYLASE in the last 168 hours. No results for input(s): AMMONIA in the last 168 hours. CBC:  Recent  Labs Lab 01/20/15 0952 01/22/15 1018 01/23/15 0554  WBC 7.2 8.6 7.4  NEUTROABS 5.1 7.2*  --   HGB 8.9* 8.9* 7.7*  HCT 27.3* 27.7* 24.2*  MCV 96.8 97.9 98.4  PLT 333 333 346   Cardiac Enzymes: No results for input(s): CKTOTAL, CKMB, CKMBINDEX, TROPONINI in the last 168 hours. BNP: No results for input(s): PROBNP in the last 168 hours. D-Dimer: No results for input(s): DDIMER in the last 168 hours. CBG: No results for input(s): GLUCAP in the last 168 hours. Hemoglobin A1C: No results for input(s): HGBA1C in the last 168 hours. Fasting Lipid Panel: No results for input(s): CHOL, HDL, LDLCALC, TRIG, CHOLHDL, LDLDIRECT in the last 168 hours. Thyroid Function Tests: No results for input(s): TSH, T4TOTAL, FREET4, T3FREE, THYROIDAB in the last 168 hours. Coagulation: No results for input(s): LABPROT, INR in the last 168 hours. Anemia Panel: No results for input(s): VITAMINB12, FOLATE, FERRITIN, TIBC, IRON, RETICCTPCT in the last 168 hours. Urine Drug Screen: Drugs of Abuse  No results found for: LABOPIA, COCAINSCRNUR, LABBENZ, AMPHETMU, THCU, LABBARB  Alcohol Level: No results for input(s): ETH in the last 168 hours. Urinalysis:  Recent Labs Lab 01/22/15 1018  LABSPEC 1.030  PHURINE  5.0  GLUCOSEU Negative  HGBUR Large  BILIRUBINUR Negative  KETONESUR Negative  PROTEINUR 2000  UROBILINOGEN 0.2  NITRITE Negative  LEUKOCYTESUR Color Interference due to blood in urine    Micro Results: Recent Results (from the past 240 hour(s))  Urine Culture     Status: None   Collection Time: 01/22/15 10:18 AM  Result Value Ref Range Status   Urine Culture, Routine Culture, Urine  Final    Comment: Final - ===== COLONY COUNT: ===== NO GROWTH NO GROWTH    Studies/Results: US Renal  01/22/2015  CLINICAL DATA:  Renal failure.  Chronic kidney disease stage 3. EXAM: RENAL / URINARY TRACT ULTRASOUND COMPLETE COMPARISON:  01/01/2015 FINDINGS: Right Kidney: Length: 11.1. Increased  parenchymal echogenicity. No mass or hydronephrosis visualized. Left Kidney: Length: 12.2 cm. Increased parenchymal echogenicity. There is a cyst arising from the upper pole measuring 1.1 x 1.2 x 0.9 cm peer no mass or hydronephrosis. Bladder: Appears normal for degree of bladder distention. IMPRESSION: 1. No acute findings 2. Bilateral echogenic kidneys compatible with chronic medical renal disease. 3. Left kidney cyst. Electronically Signed   By: Kerby Moors M.D.   On: 01/22/2015 14:23   Medications: Scheduled Meds: . acyclovir  400 mg Oral BID  . dexamethasone  10 mg Intravenous 4 times per day  . feeding supplement  1 Container Oral BID BM  . heparin  5,000 Units Subcutaneous 3 times per day   Continuous Infusions: . sodium chloride 150 mL/hr at 01/23/15 1150   PRN Meds:.acetaminophen **OR** acetaminophen, senna-docusate, traMADol Assessment/Plan: Principal Problem:   AKI (acute kidney injury) ([place]) Active Problems:   Multiple myeloma (HCC)   Prostatitis   Hypoalbuminemia due to protein-calorie malnutrition (HCC)   Antineoplastic chemotherapy induced anemia  Acute Kidney Injury: worsening hematuria and proteinuria with baseline creatine of approximately 1 to 5.3 on admission - renal ultrasound and CT abdomen and pelvis unremarkable for apparent cause showing no evidence of obstruction - Levaquin and Mobic may be contributing as well as contrast received recently for CT - Nephrology following, appreciate their recommendations.  Differential includes myeloma, allergic interstitial nephritis, or possible vasculitis as he has had hematuria for a while, possible Mobic contributing - Discussed with urology who will perform bedside cystoscopy possibly tommorw, appreciate their assistance - Consider kidney biopsy - IV fluids - GBM Ab negative - C4 normal, C3 low at 78 (normal is 82-167) - ANA, Anca pending - Creatine elevated to 5.36 this AM from 5.3 yesterday - Follow serum  creatine  Multiple Myeloma: on Lenalidomide maintenance currently, followed with Dr. Jana Hakim. Has had recent increasing Kappa and IgG as well as worsening Kappa:Lambda ratio back to levels similar to initial presentation in 01/2014, concerning for progression - PSA normal - Check 24 hour urine protein, urine IFE - Dr. Jana Hakim following, appreciate his assistance - Consider renal biopsy if no significant improvement on supportive care - Dexamethasone 55m weekly starting 12/23 - Stop Lenalidomide - Continue Acyclovir - Tramadol [id number] prn for pain - Zofran prn nausea  Normocytic Anemia - baseline 13-14, now 8.9 from 11.9 only 3 weeks ago. Patient is on Lenalidomide prior to admission, which is frequently associated with hematologic toxicity. However, in the context of his other symptoms, there is a concern for worsening multiple myeloma. -Follow-up CBCs -Stop Lenalidomide  Maculopapular Bilateral Forearm Rash: this is improved, likely drug-induced rash, without other symptoms suggestive of systemic allergic reaction -Continue to monitor  Dispo: Disposition is deferred at this time, awaiting improvement of  current medical problems.  Anticipated discharge in approximately 2-3 day(s).   The patient does have a current PCP (Tresa Garter, MD) and does not need an [facility] hospital follow-up appointment after discharge.  The patient does not have transportation limitations that hinder transportation to clinic appointments.  .Services Needed at time of discharge: Y = Yes, Blank = No PT:   OT:   RN:   Equipment:   Other:     LOS: 1 day   Jule Ser, DO 01/23/2015, 12:08 PM

## 2015-01-23 NOTE — Progress Notes (Signed)
Charles [name]   DOB:[birth year]   MA#:[id number]   [id number]  Subjective: Charles [name] still has hematuria. Right rib cage pain is better.He stopped his mobic and cipro Tuesday. In general he feels "better." Wife in room   Objective middle aged Spanish speaker examined in bed:  Filed Vitals:   01/22/15 2111 01/23/15 0519  BP: 111/63 109/63  Pulse: 66 67  Temp: 97.6 F (36.4 C) 98.1 F (36.7 C)  Resp: 18 18    Body mass index is 28 kg/(m^2).  Intake/Output Summary (Last 24 hours) at 01/23/15 0716 Last data filed at 01/23/15 0600  Gross per 24 hour  Intake    200 ml  Output    100 ml  Net    100 ml     Sclerae unicteric  No peripheral adenopathy  Lungs clear -- auscultated anterolaterallyi  Heart regular rate and rhythm  Abdomen benign  no peripheral edema  Neuro nonfocal     CBG (last 3)  No results for input(s): GLUCAP in the last 72 hours.   Labs:  Lab Results  Component Value Date   WBC 7.4 01/23/2015   HGB 7.7* 01/23/2015   HCT 24.2* 01/23/2015   MCV 98.4 01/23/2015   PLT 346 01/23/2015   NEUTROABS 7.2* 01/22/2015    _0 @  Urine Studies No results for input(s): UHGB, CRYS in the last 72 hours.  Invalid input(s): UACOL, UAPR, USPG, UPH, UTP, UGL, UKET, UBIL, UNIT, UROB, ULEU, UEPI, UWBC, URBC, UBAC, CAST, UCOM, BILUA  Basic Metabolic Panel:  Recent Labs Lab 01/20/15 0952 01/22/15 1018  NA 138 142  K 3.4* 3.9  CO2 24 22  GLUCOSE 99 128  BUN 21.9 51.2*  CREATININE 3.1* 5.3*  CALCIUM 9.5 9.3   GFR Estimated Creatinine Clearance: 16.5 mL/min (by C-G formula based on Cr of 5.3). Liver Function Tests:  Recent Labs Lab 01/20/15 0952 01/22/15 1018  AST 22 20  ALT 22 25  ALKPHOS 150 191*  BILITOT 0.42 <0.30  PROT 7.9 7.6  ALBUMIN 2.0* 2.0*   No results for input(s): LIPASE, AMYLASE in the last 168 hours. No results for input(s): AMMONIA in the last 168 hours. Coagulation profile No results for input(s): INR, PROTIME in the  last 168 hours.  CBC:  Recent Labs Lab 01/20/15 0952 01/22/15 1018 01/23/15 0554  WBC 7.2 8.6 7.4  NEUTROABS 5.1 7.2*  --   HGB 8.9* 8.9* 7.7*  HCT 27.3* 27.7* 24.2*  MCV 96.8 97.9 98.4  PLT 333 333 346   Cardiac Enzymes: No results for input(s): CKTOTAL, CKMB, CKMBINDEX, TROPONINI in the last 168 hours. BNP: Invalid input(s): POCBNP CBG: No results for input(s): GLUCAP in the last 168 hours. D-Dimer No results for input(s): DDIMER in the last 72 hours. Hgb A1c No results for input(s): HGBA1C in the last 72 hours. Lipid Profile No results for input(s): CHOL, HDL, LDLCALC, TRIG, CHOLHDL, LDLDIRECT in the last 72 hours. Thyroid function studies No results for input(s): TSH, T4TOTAL, T3FREE, THYROIDAB in the last 72 hours.  Invalid input(s): FREET3 Anemia work up No results for input(s): VITAMINB12, FOLATE, FERRITIN, TIBC, IRON, RETICCTPCT in the last 72 hours. Microbiology No results found for this or any previous visit (from the past 240 hour(s)).    Studies:  US Renal  01/22/2015  CLINICAL DATA:  Renal failure.  Chronic kidney disease stage 3. EXAM: RENAL / URINARY TRACT ULTRASOUND COMPLETE COMPARISON:  01/01/2015 FINDINGS: Right Kidney: Length: 11.1. Increased parenchymal echogenicity. No mass or hydronephrosis visualized. Left Kidney:  Length: 12.2 cm. Increased parenchymal echogenicity. There is a cyst arising from the upper pole measuring 1.1 x 1.2 x 0.9 cm peer no mass or hydronephrosis. Bladder: Appears normal for degree of bladder distention. IMPRESSION: 1. No acute findings 2. Bilateral echogenic kidneys compatible with chronic medical renal disease. 3. Left kidney cyst. Electronically Signed   By: Kerby Moors M.D.   On: 01/22/2015 14:23    Assessment: 39 y.o. Spanish speaker now admitted with ARF, presenting January 2016 with bony pain, multiple lytic lesions, and monoclonal IgG kappa paraprotein in serum (2.22 g/dL) and urine (145 mg/dL), bone marrow biopsy  02/03/2014 showing an average 12% plasmacytosis, with some sheets of abnormal plasma cells noted, cytogenetics showing multiple abnormalities including 13 del (high risk); baseline beta 2 microglobulin was 7.36, baseline creatinine 1.64, with GFR 51, calcium on general 05/21/2014 was 10.8, LDH was normal  (1) Multiple myeloma stage IIA diagnosed January 2016;   I: CyBorD started January 2016, discontinued 06/12/2014 with excellent response (a) dexamethasone 20 mg/d every TU/WED started 02/04/2014 (b) bortezomib sQ days 1,4,8,11 of every 21 day cycle started 02/10/2014 (c) cyclophosphamide 300 mg/M2 IV weekly started 02/13/2014  2: Lenalidomide 27m daily started 07/26/2014 for maintenance  (2) chronic kidney disease stage III at baseline  (3) hypercalcemia: zolendronate started 02/10/2014-- repeat every 4 weeks initially, then every 12 weeks, most recent dose 11/14/2014  (4) ID prophylaxis: (a) influenza and Pneumovax [PV13] vaccines 02/07/2014 (b) acyclovir started 02/07/2014 (c) HIV negative 01/31/2014 (d) Pneumovax P23 ro be given after March 2016  (5). The patient is not a transplant candidate for financial reasons  Plan: renal [place]oreashows no hydronephrosis. It could be we are seeing temporary renal damage from the recent treatment with cipro and NSAIDs (last dose 01/20/2015) but we could also be seeing myeloma kidney. If there is not significant improvement with supportive measures may need renal biopsy.  He has an appointment 01/28/2015 with radiation oncology to consider local radiation to painful bony area in right rib cade. We have scheduled him in out office to resume chemotherapy for his myeloma 02/04/2014.  Will start dexamethasone 40 mg weekly today. Will stop lenalidomide. Needs to stay on acyclovir. Pain management at your discretion  I will be out of town until [place]. I will ask one of  my partners to follow up on Charles [name] in my absence.  Greatly appreciate youor help to this patient!   MChauncey Cruel MD 01/23/2015  7:16 AM Medical Oncology and Hematology [facility] [address]Tel. [phone]   Fax. [phone]

## 2015-01-23 NOTE — Telephone Encounter (Signed)
Chemo added per pof and patient will get a new avs 02/05/15

## 2015-01-23 NOTE — Progress Notes (Signed)
Interpreter Lesle Chris for [place]

## 2015-01-23 NOTE — Consult Note (Signed)
Urology Consult  Referring physician: Dr. Murriel Hopper Cc: Dr. Bjorn Loser  Reason for referral:   Gross Hematuria, Acute kidney Injury  Chief Complaint:  Gross Hematuria  History of Present Illness:   [name] is a 39yo Spanish-speaking Male,  With  multiple myeloma, Stage IIA,  diagnosed January 2016. He was initially treated with CyBorD, but now on lenalidomide and zoledronic acid.    He is admitted from the [facility]- ED after being sent from his oncology clinic for acutely worsening renal function. He complains of one month of worsening malaise, fatigue, nausea with occasional vomiting,  and painless gross hematuria.    He was seen by  Dr. Wendy Poet  on 01/09/2015, with urinary frequency, urgency, and rectal exam showing 40g, benign prostate with normal rectal sphincter tone. He was  diagnosed with prostatitis,  And, noting that pt had previously improved on antibiotic for similar symptoms, he was treated with cipro and mobic, and  Given an appointment to [facility] for cystoscopy in 1 month. He reports a rash developing, and was was seen at his Oncologist' office.     He has stopped both medicines and  rash improved greatly with benadryl, pepcid, and a medrol dose pack. He had been vomiting intermittently over the past couple of days and was felt to be dehydrated, evidenced by a rising creatinine to 3.1 and K+ 3.4. He was given IV fluids and zofran in the office yesterday. He returned to clinic for re-check today and his creatinine rose even further to 5.3 so he was sent to the ED for further evaluation and management.   Over the past 1 month, besides the above symptoms, he also notes occasional night sweats and subjective chills as well as increased urinary urgency and mild testicular pain only when he lifts heavy objects. He denies headache, vision changes, chest pain, shortness of breath, palpitations, dysuria, perineal pain/pain with sitting, changes in bowel movement, recent  travel, or sick contacts. Additionally, he has noted worsening right lateral rib pain, with plain film yesterday showing possibility of myeloma involvement in the R 2nd rib. He also endorses moderate R hip pain that is chronic and unchanged.    Past Medical History  Diagnosis Date  . IgG myeloma ([place])   . CKD (chronic kidney disease), stage III   . Hypercholesterolemia   . Lytic bone lesions on xray    History reviewed. No pertinent past surgical history.  Medications: I have reviewed the patient's current medications. Allergies:  Allergies  Allergen Reactions  . Ciprofloxacin Rash  . Mobic [Meloxicam] Rash    Family History  Problem Relation Age of Onset  . Diabetes Father    Social History:  reports that he has never smoked. He has never used smokeless tobacco. He reports that he does not drink alcohol or use illicit drugs.  ROS: All systems are reviewed and negative except as noted.  Physical Exam:  Vital signs in last 24 hours: Temp:  [97.6 F (36.4 C)-98.1 F (36.7 C)] 98 F (36.7 C) (12/23 1005) Pulse Rate:  [66-88] 71 (12/23 1005) Resp:  [18-27] 20 (12/23 1005) BP: (109-128)/(63-79) 119/64 mmHg (12/23 1005) SpO2:  [96 %-100 %] 99 % (12/23 1005) Weight:  [71.668 kg (158 lb)-71.714 kg (158 lb 1.6 oz)] 71.668 kg (158 lb) (12/22 2111)  Cardiovascular: Skin warm; not flushed Respiratory: Breaths quiet; no shortness of breath Abdomen: No masses Neurological: Normal sensation to touch Musculoskeletal: Normal motor function arms and legs Lymphatics: No inguinal adenopathy Skin:  No rashes Genitourinary: normal penis and scrotum  Laboratory Data:  Results for orders placed or performed during the hospital encounter of 01/22/15 (from the past 72 hour(s))  C3 complement     Status: Abnormal   Collection Time: 01/22/15  4:22 PM  Result Value Ref Range   C3 Complement 78 (L) 82 - 167 mg/dL    Comment: (NOTE) Performed At: [facility] [place], [place] [id number] Lindon Romp MD [id number]   C4 complement     Status: None   Collection Time: 01/22/15  4:22 PM  Result Value Ref Range   Complement C4, Body Fluid 18 14 - 44 mg/dL    Comment: (NOTE) Performed At: [facility] [place], [place] [id number] Lindon Romp MD [id number]   Glomerular basement membrane antibodies     Status: None   Collection Time: 01/22/15  4:22 PM  Result Value Ref Range   GBM Ab 4 0 - 20 units    Comment: (NOTE)                   Negative                   0 - 20                   Weak Positive             21 - 30                   Moderate to Strong Positive   >30 Performed At: [facility] [place], [place] [id number] Lindon Romp MD [id number]   Protein / creatinine ratio, urine     Status: Abnormal   Collection Time: 01/22/15  5:02 PM  Result Value Ref Range   Creatinine, Urine 88.85 mg/dL   Total Protein, Urine 190 mg/dL    Comment: RESULTS CONFIRMED BY MANUAL DILUTION NO NORMAL RANGE ESTABLISHED FOR THIS TEST    Protein Creatinine Ratio 2.14 (H) 0.00 - 0.15 mg/mg[Cre]  CBC     Status: Abnormal   Collection Time: 01/23/15  5:54 AM  Result Value Ref Range   WBC 7.4 4.0 - 10.5 K/uL   RBC 2.46 (L) 4.22 - 5.81 MIL/uL   Hemoglobin 7.7 (L) 13.0 - 17.0 g/dL   HCT 24.2 (L) 39.0 - 52.0 %   MCV 98.4 78.0 - 100.0 fL   MCH 31.3 26.0 - 34.0 pg   MCHC 31.8 30.0 - 36.0 g/dL   RDW 15.1 11.5 - 15.5 %   Platelets 346 150 - 400 K/uL  Renal function panel     Status: Abnormal   Collection Time: 01/23/15  5:54 AM  Result Value Ref Range   Sodium 142 135 - 145 mmol/L   Potassium 4.5 3.5 - 5.1 mmol/L   Chloride 108 101 - 111 mmol/L   CO2 24 22 - 32 mmol/L   Glucose, Bld 128 (H) 65 - 99 mg/dL   BUN 60 (H) 6 - 20 mg/dL   Creatinine, Ser 5.36 (H) 0.61 - 1.24 mg/dL   Calcium 7.7 (L) 8.9 - 10.3 mg/dL   Phosphorus 6.9 (H) 2.5 - 4.6 mg/dL   Albumin 1.6 (L) 3.5 - 5.0 g/dL   GFR  calc non Af Amer 12 (L) >60 mL/min   GFR calc Af Amer 14 (L) >60 mL/min    Comment: (NOTE) The eGFR has been calculated using the  CKD EPI equation. This calculation has not been validated in all clinical situations. eGFR's persistently <60 mL/min signify possible Chronic Kidney Disease.    Anion gap 10 5 - 15  PSA     Status: None   Collection Time: 01/23/15  9:50 AM  Result Value Ref Range   PSA 3.48 0.00 - 4.00 ng/mL    Comment: (NOTE) While PSA levels of <=4.0 ng/ml are reported as reference range, some men with levels below 4.0 ng/ml can have prostate cancer and many men with PSA above 4.0 ng/ml do not have prostate cancer.  Other tests such as free PSA, age specific reference ranges, PSA velocity and PSA doubling time may be helpful especially in men less than 91 years old.    Recent Results (from the past 240 hour(s))  Urine Culture     Status: None   Collection Time: 01/22/15 10:18 AM  Result Value Ref Range Status   Urine Culture, Routine Culture, Urine  Final    Comment: Final - ===== COLONY COUNT: ===== NO GROWTH NO GROWTH    Creatinine:  Recent Labs  01/20/15 0952 01/22/15 1018 01/23/15 0554  CREATININE 3.1* 5.3* 5.36*    Xrays: CLINICAL DATA: Acute onset of right lower quadrant abdominal pain and hematuria. Fever, nausea, chills and diaphoresis. Initial encounter.  EXAM: CT ABDOMEN AND PELVIS WITH CONTRAST  TECHNIQUE: Multidetector CT imaging of the abdomen and pelvis was performed using the standard protocol following bolus administration of intravenous contrast.  CONTRAST: 111m OMNIPAQUE IOHEXOL 300 MG/ML SOLN  COMPARISON: CT of the abdomen and pelvis performed 02/03/2014  FINDINGS: Mild bibasilar atelectasis is noted.  The liver and spleen are unremarkable in appearance. The gallbladder is within normal limits. The pancreas and adrenal glands are unremarkable.  A 1.2 cm cyst is noted at the interpole region of the left  kidney. The kidneys are otherwise unremarkable. There is no evidence of hydronephrosis. No renal or ureteral stones are seen. No perinephric stranding is appreciated.  No free fluid is identified. The small bowel is unremarkable in appearance. The stomach is within normal limits. No acute vascular abnormalities are seen.  The appendix is normal in caliber, without evidence of appendicitis. Contrast progresses to the level of the rectum. The colon is unremarkable in appearance.  The bladder is mildly distended and grossly unremarkable. The prostate remains normal in size. No inguinal lymphadenopathy is seen.  No acute osseous abnormalities are identified.  IMPRESSION: 1. No acute abnormality seen in the abdomen or pelvis. 2. Small left renal cyst noted. 3. Mild bibasilar atelectasis noted.   Electronically Signed  By: JGarald BaldingM.D.  On: 12/28/2014 07:02      CLINICAL DATA: Multiple myeloma with right-sided chest pain  EXAM: RIGHT RIBS AND CHEST - 3+ VIEW  COMPARISON: CT chest 02/03/2014. Chest CT 01/30/2014. Chest x-ray 01/05/2014  FINDINGS: Heart size within normal limits. Negative for heart failure. Lungs are clear without infiltrate or effusion  Extrapleural soft tissue thickening in the right lung apex. This has been present on prior studies and may be due to scarring. There is a chronic healed fracture of the right anterior second rib which was noted on prior studies. The right second rib also shows abnormal infiltrative low density on CT suggestive of myeloma. No definite bony destruction is seen on the current study. Chronic fracture of the right anterior fifth rib unchanged from prior studies. Negative for acute fracture.  IMPRESSION: No acute cardiopulmonary disease.  Chronic healed fractures right second and  fifth ribs anteriorly.  Right apical pleural density is similar prior studies and may be due to benign pleural  thickening.  Based on the prior CT there could be myeloma in the right second rib however no bony destruction is identified on the current study.   Electronically Signed  By: Franchot Gallo M.D.  On: 01/20/2015 15:27      CLINICAL DATA: Renal failure. Chronic kidney disease stage 3.  EXAM: RENAL / URINARY TRACT ULTRASOUND COMPLETE  COMPARISON: 01/01/2015  FINDINGS: Right Kidney:  Length: 11.1. Increased parenchymal echogenicity. No mass or hydronephrosis visualized.  Left Kidney:  Length: 12.2 cm. Increased parenchymal echogenicity. There is a cyst arising from the upper pole measuring 1.1 x 1.2 x 0.9 cm peer no mass or hydronephrosis.  Bladder:  Appears normal for degree of bladder distention.  IMPRESSION: 1. No acute findings 2. Bilateral echogenic kidneys compatible with chronic medical renal disease. 3. Left kidney cyst.   Electronically Signed  By: Kerby Moors M.D.  On: 01/22/2015 14:23   Impression/Assessment:  Discussed with Dr. Beryle Beams. 1. Gross hematuria                                                      2. Multiple Myeloma ( IgG                                                      3. CKD III                                                      4. Lytic bone lesions      Note :no hydronephrosis on ultrasound, and possible myeloma in ribs, and possible myeloma in kidneys. However, unusual for gross hematuria in this situation per Dr. Beryle Beams. Agree that pt will need cystoscopy, and note that he has been scheduled for office cysto per Dr. Matilde Sprang. However, in view of his acute downhill course, we will pursue bedside cysto to see if any other reason for hematuria can be established.  Pt does not speak [place], but wife translates. I have discussed case with both Nurses and OR, so will have bedside cysto ready for AM.   Plan:  1. Urine cytology 2, PSA-done 3. Bedside cysto-probably tomorrow.  4. Consider bx  kidney. Celsa Nordahl I Chela Sutphen 01/23/2015, 11:34 AM

## 2015-01-24 LAB — CBC WITH DIFFERENTIAL/PLATELET
BASOS ABS: 0 10*3/uL (ref 0.0–0.1)
Basophils Relative: 0 %
EOS ABS: 0 10*3/uL (ref 0.0–0.7)
EOS PCT: 0 %
HCT: 23.3 % — ABNORMAL LOW (ref 39.0–52.0)
Hemoglobin: 7.5 g/dL — ABNORMAL LOW (ref 13.0–17.0)
LYMPHS ABS: 0.9 10*3/uL (ref 0.7–4.0)
LYMPHS PCT: 10 %
MCH: 31.6 pg (ref 26.0–34.0)
MCHC: 32.2 g/dL (ref 30.0–36.0)
MCV: 98.3 fL (ref 78.0–100.0)
MONO ABS: 0.8 10*3/uL (ref 0.1–1.0)
Monocytes Relative: 9 %
Neutro Abs: 7.2 10*3/uL (ref 1.7–7.7)
Neutrophils Relative %: 81 %
PLATELETS: 341 10*3/uL (ref 150–400)
RBC: 2.37 MIL/uL — ABNORMAL LOW (ref 4.22–5.81)
RDW: 15.5 % (ref 11.5–15.5)
WBC: 8.9 10*3/uL (ref 4.0–10.5)

## 2015-01-24 LAB — BASIC METABOLIC PANEL
Anion gap: 10 (ref 5–15)
BUN: 69 mg/dL — AB (ref 6–20)
CO2: 20 mmol/L — ABNORMAL LOW (ref 22–32)
CREATININE: 5.17 mg/dL — AB (ref 0.61–1.24)
Calcium: 6.7 mg/dL — ABNORMAL LOW (ref 8.9–10.3)
Chloride: 113 mmol/L — ABNORMAL HIGH (ref 101–111)
GFR calc Af Amer: 15 mL/min — ABNORMAL LOW (ref >60)
GFR, EST NON AFRICAN AMERICAN: 13 mL/min — AB (ref >60)
GLUCOSE: 129 mg/dL — AB (ref 65–99)
POTASSIUM: 4.4 mmol/L (ref 3.5–5.1)
SODIUM: 143 mmol/L (ref 135–145)

## 2015-01-24 LAB — PROTEIN, URINE, 24 HOUR
COLLECTION INTERVAL-UPROT: 24 h
PROTEIN, 24H URINE: 1840 mg/d — AB (ref 50–100)
Protein, Urine: 92 mg/dL
URINE TOTAL VOLUME-UPROT: 2000 mL

## 2015-01-24 MED ORDER — CEPHALEXIN 500 MG PO CAPS
500.0000 mg | ORAL_CAPSULE | Freq: Three times a day (TID) | ORAL | Status: DC
  Administered 2015-01-24 – 2015-01-25 (×5): 500 mg via ORAL
  Filled 2015-01-24 (×6): qty 1

## 2015-01-24 MED ORDER — PANTOPRAZOLE SODIUM 20 MG PO TBEC
20.0000 mg | DELAYED_RELEASE_TABLET | Freq: Two times a day (BID) | ORAL | Status: DC
  Administered 2015-01-24 – 2015-01-27 (×7): 20 mg via ORAL
  Filled 2015-01-24 (×7): qty 1

## 2015-01-24 MED ORDER — DEXAMETHASONE SODIUM PHOSPHATE 10 MG/ML IJ SOLN
20.0000 mg | Freq: Two times a day (BID) | INTRAMUSCULAR | Status: AC
  Administered 2015-01-24 – 2015-01-26 (×6): 20 mg via INTRAVENOUS
  Filled 2015-01-24 (×8): qty 2

## 2015-01-24 NOTE — Op Note (Signed)
Pre-operative diagnosis :   Gross hematuria  Postoperative diagnosis:  same  Operation:  Flexible Bedside Cystoscopy  Surgeon:  S. Gaynelle Arabian, MD  First assistant:  none  Anesthesia:  Xylocaine, transurethral gel, 20cc  Preparation: The history is reviewed, and, after discussion with the patient and his wife as Optometrist, and son as Optometrist, the family ( except wife) is excused; and the penis is prepped with betadine and draped in the usual fashion. 20cc of Xylocaine gel is placed in the urethra.   Review history: [name] is a 39yo Spanish-speaking Male, With multiple myeloma, Stage IIA, diagnosed January 2016. He was initially treated with CyBorD, but now on lenalidomide and zoledronic acid.  He is admitted from the [facility]- ED after being sent from his oncology clinic for acutely worsening renal function. He complains of one month of worsening malaise, fatigue, nausea with occasional vomiting, and painless gross hematuria.  He was seen by Dr. Wendy Poet on 01/09/2015, with urinary frequency, urgency, and rectal exam showing 40g, benign prostate with normal rectal sphincter tone. He was diagnosed with prostatitis, And, noting that pt had previously improved on antibiotic for similar symptoms, he was treated with cipro and mobic, and Given an appointment to [facility] for cystoscopy in 1 month. He reports a rash developing, and was was seen at his Oncologist' office.    After discussion with  Dr. Beryle Beams, the pt is now scheduled for cystoscopy.   Statement of  Likelihood of Success: Excellent. TIME-OUT observed.:  Procedure:  flexable bedside cystoscopy is accomplished in the supine position. The urethral meatus is normal. The anterior urethra is normal, and the posterior urethra is normal. No stricture. The prostate is bilobar. The bladder shows normal trigone, and no trabeculation, and no cellules, and no stone or tumor. Clear efflux is seen from both ureteral  orifices.    The scope is withdrawn, and the patient is counseled on normal findings. He will be covered with single dose Cipro 579m.

## 2015-01-24 NOTE — Progress Notes (Signed)
Subjective: Patient seen and examined this morning.  No acute events overnight.  Wife not present this morning.  Complains today of right rib cage pain, no other pain, no other complaints.  Good urine output.  Objective: Vital signs in last 24 hours: Filed Vitals:   01/23/15 1749 01/23/15 2007 01/24/15 0512 01/24/15 0800  BP: 119/62 102/45 117/68 123/80  Pulse: 70 66 63 63  Temp: 98.2 F (36.8 C) 98.1 F (36.7 C) 97.9 F (36.6 C) 98.2 F (36.8 C)  TempSrc: Oral Oral Oral Oral  Resp: 18 16 18 17   Height:      Weight:  157 lb 13.4 oz (71.594 kg)    SpO2: 96% 96% 98% 99%   Weight change: -4.2 oz (-0.12 kg)  Intake/Output Summary (Last 24 hours) at 01/24/15 0959 Last data filed at 01/24/15 0859  Gross per 24 hour  Intake   1905 ml  Output   1250 ml  Net    655 ml   General: sleeping in bed, easily aroused HEENT: EOMI, no scleral icterus Cardiac: RRR, no rubs, murmurs or gallops Pulm: clear to auscultation bilaterally, moving normal volumes of air Abd: soft, nontender, nondistended MSK: tender to palpation on right rib Ext: warm and well perfused, no pedal edema, mild maculopapular rash on bilateral upper extremities improved Neuro: alert and oriented X3, no focal deficits  Lab Results: Basic Metabolic Panel:  Recent Labs Lab 01/23/15 0554 01/24/15 0538  NA 142 143  K 4.5 4.4  CL 108 113*  CO2 24 20*  GLUCOSE 128* 129*  BUN 60* 69*  CREATININE 5.36* 5.17*  CALCIUM 7.7* 6.7*  PHOS 6.9*  --    Liver Function Tests:  Recent Labs Lab 01/20/15 0952 01/22/15 1018 01/23/15 0554  AST 22 20  --   ALT 22 25  --   ALKPHOS 150 191*  --   BILITOT 0.42 <0.30  --   PROT 7.9 7.6  --   ALBUMIN 2.0* 2.0* 1.6*   No results for input(s): LIPASE, AMYLASE in the last 168 hours. No results for input(s): AMMONIA in the last 168 hours. CBC:  Recent Labs Lab 01/22/15 1018 01/23/15 0554 01/24/15 0538  WBC 8.6 7.4 8.9  NEUTROABS 7.2*  --  7.2  HGB 8.9* 7.7* 7.5*    HCT 27.7* 24.2* 23.3*  MCV 97.9 98.4 98.3  PLT 333 346 341   Cardiac Enzymes: No results for input(s): CKTOTAL, CKMB, CKMBINDEX, TROPONINI in the last 168 hours. BNP: No results for input(s): PROBNP in the last 168 hours. D-Dimer: No results for input(s): DDIMER in the last 168 hours. CBG: No results for input(s): GLUCAP in the last 168 hours. Hemoglobin A1C: No results for input(s): HGBA1C in the last 168 hours. Fasting Lipid Panel: No results for input(s): CHOL, HDL, LDLCALC, TRIG, CHOLHDL, LDLDIRECT in the last 168 hours. Thyroid Function Tests: No results for input(s): TSH, T4TOTAL, FREET4, T3FREE, THYROIDAB in the last 168 hours. Coagulation: No results for input(s): LABPROT, INR in the last 168 hours. Anemia Panel: No results for input(s): VITAMINB12, FOLATE, FERRITIN, TIBC, IRON, RETICCTPCT in the last 168 hours. Urine Drug Screen: Drugs of Abuse  No results found for: LABOPIA, COCAINSCRNUR, LABBENZ, AMPHETMU, THCU, LABBARB  Alcohol Level: No results for input(s): ETH in the last 168 hours. Urinalysis:  Recent Labs Lab 01/22/15 1018  LABSPEC 1.030  PHURINE 5.0  GLUCOSEU Negative  HGBUR Large  BILIRUBINUR Negative  KETONESUR Negative  PROTEINUR 2000  UROBILINOGEN 0.2  NITRITE Negative  LEUKOCYTESUR Color Interference due to blood in urine    Micro Results: Recent Results (from the past 240 hour(s))  Urine Culture     Status: None   Collection Time: 01/22/15 10:18 AM  Result Value Ref Range Status   Urine Culture, Routine Culture, Urine  Final    Comment: Final - ===== COLONY COUNT: ===== NO GROWTH NO GROWTH    Studies/Results: US Renal  01/22/2015  CLINICAL DATA:  Renal failure.  Chronic kidney disease stage 3. EXAM: RENAL / URINARY TRACT ULTRASOUND COMPLETE COMPARISON:  01/01/2015 FINDINGS: Right Kidney: Length: 11.1. Increased parenchymal echogenicity. No mass or hydronephrosis visualized. Left Kidney: Length: 12.2 cm. Increased parenchymal  echogenicity. There is a cyst arising from the upper pole measuring 1.1 x 1.2 x 0.9 cm peer no mass or hydronephrosis. Bladder: Appears normal for degree of bladder distention. IMPRESSION: 1. No acute findings 2. Bilateral echogenic kidneys compatible with chronic medical renal disease. 3. Left kidney cyst. Electronically Signed   By: Kerby Moors M.D.   On: 01/22/2015 14:23   Medications: Scheduled Meds: . acyclovir  400 mg Oral Daily  . dexamethasone  20 mg Intravenous Q12H  . feeding supplement  1 Container Oral BID BM  . heparin  5,000 Units Subcutaneous 3 times per day  . pantoprazole  20 mg Oral BID   Continuous Infusions: . sodium chloride 150 mL/hr at 01/24/15 0838   PRN Meds:.acetaminophen **OR** acetaminophen, senna-docusate, traMADol Assessment/Plan: Principal Problem:   AKI (acute kidney injury) ([place]) Active Problems:   Multiple myeloma ([place])   Prostatitis   Hypoalbuminemia due to protein-calorie malnutrition ([place])   Antineoplastic chemotherapy induced anemia  Acute Kidney Injury: worsening hematuria and proteinuria with baseline creatine of approximately 1 to 5.3 on admission.  Creatine today 5.17 - renal ultrasound and CT abdomen and pelvis unremarkable for apparent cause showing no evidence of obstruction - Levaquin and Mobic likely contributing as well as contrast received recently for CT that caused kidneys to reach fever pitch - Nephrology following, appreciate their recommendations.  Differential includes acute interstitial nephritis, progression of myleoma.  Less likely vasculitis as hematuria predated worsening of renal function - Discussed with urology and plan to perform bedside cystoscopy today, appreciate their assistance - Consider kidney biopsy - IV fluids at 462m/hr - GBM Ab negative - C4 normal, C3 low at 78 (normal is 82-167) - ANA, Anca negative - Urine output good with 14562myesterday (0.62m74mg/hr) - Creatine improved slightly to 5.17 this  morning - Follow serum creatine  Multiple Myeloma: on Lenalidomide maintenance prior to admission, followed with Dr. MagJana Hakimas had recent increasing Kappa and IgG as well as worsening Kappa:Lambda ratio back to levels similar to initial presentation in 01/2014, concerning for progression - PSA normal - Check 24 hour urine protein, urine IFE - Dr. MagJana Hakimllowing, appreciate his assistance - Consider renal biopsy if no significant improvement on supportive care - Dexamethasone 77m61mD x 3 more days starting today - Pantoprazole 77mg67m - Dexamethasone 40mg 862mly starting 12/23 as well as above - Stop Lenalidomide - Continue Acyclovir - Tramadol 50mg q61mrn for pain - Zofran prn nausea  Normocytic Anemia - baseline 13-14, now 7.5 from 11.9 only 3 weeks ago. Patient is on Lenalidomide prior to admission, which is frequently associated with hematologic toxicity. However, in the context of his other symptoms, there is a concern for worsening multiple myeloma. -Follow-up CBCs -Stop Lenalidomide  Maculopapular Bilateral Forearm Rash: this is improved, likely drug-induced rash, without other symptoms  suggestive of systemic allergic reaction -Continue to monitor  Dispo: Disposition is deferred at this time, awaiting improvement of current medical problems.  Anticipated discharge in approximately 2-3 day(s).   The patient does have a current PCP (Tresa Garter, MD) and does not need an [facility] hospital follow-up appointment after discharge.  The patient does not have transportation limitations that hinder transportation to clinic appointments.  .Services Needed at time of discharge: Y = Yes, Blank = No PT:   OT:   RN:   Equipment:   Other:     LOS: 2 days   Jule Ser, DO 01/24/2015, 9:59 AM

## 2015-01-24 NOTE — Progress Notes (Signed)
Subjective:  1500 of UOP creatinine down Charles [name] little- BUN up but likely from the steroids- he has no c/o's- urine used to be red now more brown Objective Vital signs in last 24 hours: Filed Vitals:   01/23/15 1749 01/23/15 2007 01/24/15 0512 01/24/15 0800  BP: 119/62 102/45 117/68 123/80  Pulse: 70 66 63 63  Temp: 98.2 F (36.8 C) 98.1 F (36.7 C) 97.9 F (36.6 C) 98.2 F (36.8 C)  TempSrc: Oral Oral Oral Oral  Resp: '18 16 18 17  '$ Height:      Weight:  71.594 kg (157 lb 13.4 oz)    SpO2: 96% 96% 98% 99%   Weight change: -0.12 kg (-4.2 oz)  Intake/Output Summary (Last 24 hours) at 01/24/15 1058 Last data filed at 01/24/15 0859  Gross per 24 hour  Intake   1665 ml  Output   1250 ml  Net    415 ml    Assessment/ Plan: Pt is Charles [name] 39 y.o. yo male who was admitted on 01/22/2015 with worsening renal function (crt 1.04 on 12/6, then 3.1 on 12/20) and  and hematuria in the setting of undergoing treatment for myeloma - being started on cipro and mobic---> rash Assessment/Plan: 1. Renal-  AKI- differential thought to be AIN- analgesic nephropathy vs worsening myeloma- less likely to be Charles [name] vasculitis as hematuria predated worsening of renal function- so far neg ANCA, ANA and anti GBM- comp low normal for c4 and low for c3- protein to creatinine ratio is 2.1 actually better than before- urology has been asked to see to look at bladder cause of hematuria- getting hydrated- U/S on 12/22 neg for obstruction- been placed on steroids- cipro and mobic stopped.  Creatinine improved but not Charles [name] huge amount - good UOP- may eventually need kidney biopsy (Mon?) if does not improve but hope it does continue to improve.  For bedside cysto today  2. HTN/volume- stable, no meds 3. Anemia- low likely mostly due to malignancy- per hem 4. Meds-  lower acyclovir for renal dosing    Charles Charles [name]    Labs: Basic Metabolic Panel:  Recent Labs Lab 01/22/15 1018 01/23/15 0554 01/24/15 0538  NA 142 142 143   K 3.9 4.5 4.4  CL  --  108 113*  CO2 22 24 20*  GLUCOSE 128 128* 129*  BUN 51.2* 60* 69*  CREATININE 5.3* 5.36* 5.17*  CALCIUM 9.3 7.7* 6.7*  PHOS  --  6.9*  --    Liver Function Tests:  Recent Labs Lab 01/20/15 0952 01/22/15 1018 01/23/15 0554  AST 22 20  --   ALT 22 25  --   ALKPHOS 150 191*  --   BILITOT 0.42 <0.30  --   PROT 7.9 7.6  --   ALBUMIN 2.0* 2.0* 1.6*   No results for input(s): LIPASE, AMYLASE in the last 168 hours. No results for input(s): AMMONIA in the last 168 hours. CBC:  Recent Labs Lab 01/20/15 0952 01/22/15 1018 01/23/15 0554 01/24/15 0538  WBC 7.2 8.6 7.4 8.9  NEUTROABS 5.1 7.2*  --  7.2  HGB 8.9* 8.9* 7.7* 7.5*  HCT 27.3* 27.7* 24.2* 23.3*  MCV 96.8 97.9 98.4 98.3  PLT 333 333 346 341   Cardiac Enzymes: No results for input(s): CKTOTAL, CKMB, CKMBINDEX, TROPONINI in the last 168 hours. CBG: No results for input(s): GLUCAP in the last 168 hours.  Iron Studies: No results for input(s): IRON, TIBC, TRANSFERRIN, FERRITIN in the last 72 hours. Studies/Results: US Renal  01/22/2015  CLINICAL DATA:  Renal failure.  Chronic kidney disease stage 3. EXAM: RENAL / URINARY TRACT ULTRASOUND COMPLETE COMPARISON:  01/01/2015 FINDINGS: Right Kidney: Length: 11.1. Increased parenchymal echogenicity. No mass or hydronephrosis visualized. Left Kidney: Length: 12.2 cm. Increased parenchymal echogenicity. There is Charles [name] cyst arising from the upper pole measuring 1.1 x 1.2 x 0.9 cm peer no mass or hydronephrosis. Bladder: Appears normal for degree of bladder distention. IMPRESSION: 1. No acute findings 2. Bilateral echogenic kidneys compatible with chronic medical renal disease. 3. Left kidney cyst. Electronically Signed   By: Kerby Moors M.D.   On: 01/22/2015 14:23   Medications: Infusions: . sodium chloride 150 mL/hr at 01/24/15 6815    Scheduled Medications: . acyclovir  400 mg Oral Daily  . dexamethasone  20 mg Intravenous Q12H  . feeding supplement   1 Container Oral BID BM  . heparin  5,000 Units Subcutaneous 3 times per day  . pantoprazole  20 mg Oral BID    have reviewed scheduled and prn medications.  Physical Exam: General: NAD Heart: RRR Lungs: clear Abdomen: soft, non tender Extremities: no edema    01/24/2015,10:58 AM  LOS: 2 days

## 2015-01-25 LAB — HEPATIC FUNCTION PANEL
ALBUMIN: 1.5 g/dL — AB (ref 3.5–5.0)
ALT: 32 U/L (ref 17–63)
AST: 18 U/L (ref 15–41)
Alkaline Phosphatase: 107 U/L (ref 38–126)
BILIRUBIN TOTAL: 0.1 mg/dL — AB (ref 0.3–1.2)
Bilirubin, Direct: <0.1 mg/dL — ABNORMAL LOW (ref 0.1–0.5)
Total Protein: 4.8 g/dL — ABNORMAL LOW (ref 6.5–8.1)

## 2015-01-25 LAB — CBC
HCT: 23.2 % — ABNORMAL LOW (ref 39.0–52.0)
Hemoglobin: 7.6 g/dL — ABNORMAL LOW (ref 13.0–17.0)
MCH: 32.2 pg (ref 26.0–34.0)
MCHC: 32.8 g/dL (ref 30.0–36.0)
MCV: 98.3 fL (ref 78.0–100.0)
PLATELETS: 334 10*3/uL (ref 150–400)
RBC: 2.36 MIL/uL — ABNORMAL LOW (ref 4.22–5.81)
RDW: 15.5 % (ref 11.5–15.5)
WBC: 7.3 10*3/uL (ref 4.0–10.5)

## 2015-01-25 LAB — BASIC METABOLIC PANEL
Anion gap: 9 (ref 5–15)
BUN: 72 mg/dL — ABNORMAL HIGH (ref 6–20)
CALCIUM: 6.4 mg/dL — AB (ref 8.9–10.3)
CO2: 18 mmol/L — AB (ref 22–32)
CREATININE: 4.59 mg/dL — AB (ref 0.61–1.24)
Chloride: 117 mmol/L — ABNORMAL HIGH (ref 101–111)
GFR calc non Af Amer: 15 mL/min — ABNORMAL LOW (ref >60)
GFR, EST AFRICAN AMERICAN: 17 mL/min — AB (ref >60)
Glucose, Bld: 147 mg/dL — ABNORMAL HIGH (ref 65–99)
Potassium: 4.5 mmol/L (ref 3.5–5.1)
SODIUM: 144 mmol/L (ref 135–145)

## 2015-01-25 MED ORDER — SODIUM CHLORIDE 0.9 % IV SOLN
INTRAVENOUS | Status: AC
  Administered 2015-01-25: 23:00:00 via INTRAVENOUS

## 2015-01-25 NOTE — Progress Notes (Signed)
Subjective: Patient seen and examined this morning.  No acute events overnight.  Bedside cystoscopy unremarkable.  Patient with some abdominal discomfort today maybe secondary to steroids.  Continues to have some right rib pain, no other pain.  No urinary complaints, no chest pain, no shortness of breath.  Objective: Vital signs in last 24 hours: Filed Vitals:   01/24/15 1540 01/24/15 2036 01/25/15 0456 01/25/15 0840  BP: 119/62 121/64 120/83 113/61  Pulse: 61 60 49 65  Temp: 98.2 F (36.8 C) 98 F (36.7 C) 98 F (36.7 C) 98 F (36.7 C)  TempSrc: Oral Oral Oral Oral  Resp: 17 18 16 17   Height:      Weight:  168 lb 1.6 oz (76.25 kg)    SpO2: 96% 97% 97% 100%   Weight change: 10 lb 4.2 oz (4.656 kg)  Intake/Output Summary (Last 24 hours) at 01/25/15 1100 Last data filed at 01/25/15 1047  Gross per 24 hour  Intake   1245 ml  Output    600 ml  Net    645 ml   General: sleeping in bed, easily aroused, no distress HEENT: EOMI, no scleral icterus Cardiac: RRR, no rubs, murmurs or gallops Pulm: clear to auscultation bilaterally, moving normal volumes of air Abd: soft, nontender, nondistended MSK: tender to palpation on right rib Ext: warm and well perfused, no pedal edema, mild maculopapular rash on bilateral upper extremities resolved Neuro: alert and oriented X3, no focal deficits  Lab Results: Basic Metabolic Panel:  Recent Labs Lab 01/23/15 0554 01/24/15 0538 01/25/15 0511  NA 142 143 144  K 4.5 4.4 4.5  CL 108 113* 117*  CO2 24 20* 18*  GLUCOSE 128* 129* 147*  BUN 60* 69* 72*  CREATININE 5.36* 5.17* 4.59*  CALCIUM 7.7* 6.7* 6.4*  PHOS 6.9*  --   --    Liver Function Tests:  Recent Labs Lab 01/22/15 1018 01/23/15 0554 01/25/15 0740  AST 20  --  18  ALT 25  --  32  ALKPHOS 191*  --  107  BILITOT <0.30  --  0.1*  PROT 7.6  --  4.8*  ALBUMIN 2.0* 1.6* 1.5*   No results for input(s): LIPASE, AMYLASE in the last 168 hours. No results for input(s):  AMMONIA in the last 168 hours. CBC:  Recent Labs Lab 01/22/15 1018  01/24/15 0538 01/25/15 0511  WBC 8.6  < > 8.9 7.3  NEUTROABS 7.2*  --  7.2  --   HGB 8.9*  < > 7.5* 7.6*  HCT 27.7*  < > 23.3* 23.2*  MCV 97.9  < > 98.3 98.3  PLT 333  < > 341 334  < > = values in this interval not displayed. Cardiac Enzymes: No results for input(s): CKTOTAL, CKMB, CKMBINDEX, TROPONINI in the last 168 hours. BNP: No results for input(s): PROBNP in the last 168 hours. D-Dimer: No results for input(s): DDIMER in the last 168 hours. CBG: No results for input(s): GLUCAP in the last 168 hours. Hemoglobin A1C: No results for input(s): HGBA1C in the last 168 hours. Fasting Lipid Panel: No results for input(s): CHOL, HDL, LDLCALC, TRIG, CHOLHDL, LDLDIRECT in the last 168 hours. Thyroid Function Tests: No results for input(s): TSH, T4TOTAL, FREET4, T3FREE, THYROIDAB in the last 168 hours. Coagulation: No results for input(s): LABPROT, INR in the last 168 hours. Anemia Panel: No results for input(s): VITAMINB12, FOLATE, FERRITIN, TIBC, IRON, RETICCTPCT in the last 168 hours. Urine Drug Screen: Drugs of Abuse  No  results found for: LABOPIA, COCAINSCRNUR, LABBENZ, AMPHETMU, THCU, LABBARB  Alcohol Level: No results for input(s): ETH in the last 168 hours. Urinalysis:  Recent Labs Lab 01/22/15 1018  LABSPEC 1.030  PHURINE 5.0  GLUCOSEU Negative  HGBUR Large  BILIRUBINUR Negative  KETONESUR Negative  PROTEINUR 2000  UROBILINOGEN 0.2  NITRITE Negative  LEUKOCYTESUR Color Interference due to blood in urine    Micro Results: Recent Results (from the past 240 hour(s))  Urine Culture     Status: None   Collection Time: 01/22/15 10:18 AM  Result Value Ref Range Status   Urine Culture, Routine Culture, Urine  Final    Comment: Final - ===== COLONY COUNT: ===== NO GROWTH NO GROWTH    Studies/Results: No results found. Medications: Scheduled Meds: . acyclovir  400 mg Oral Daily  .  cephALEXin  500 mg Oral TID  . dexamethasone  20 mg Intravenous Q12H  . feeding supplement  1 Container Oral BID BM  . heparin  5,000 Units Subcutaneous 3 times per day  . pantoprazole  20 mg Oral BID   Continuous Infusions:   PRN Meds:.acetaminophen **OR** acetaminophen, senna-docusate, traMADol Assessment/Plan: Principal Problem:   AKI (acute kidney injury) ([facility]) Active Problems:   Multiple myeloma ([facility])   Prostatitis   Hypoalbuminemia due to protein-calorie malnutrition ([facility])   Antineoplastic chemotherapy induced anemia  Acute Kidney Injury: worsening hematuria and proteinuria with baseline creatine of approximately 1 to 5.3 on admission.  Creatine today 4.5 today - renal ultrasound and CT abdomen and pelvis unremarkable for apparent cause showing no evidence of obstruction - Levaquin and Mobic likely contributing as well as contrast received recently for CT that caused kidneys to reach fever pitch - Nephrology following, appreciate their recommendations.  Differential includes acute interstitial nephritis, progression of myleoma.  Less likely vasculitis as hematuria predated worsening of renal function.  Consider kidney biopsy - Bedside cystoscopy yesterday unremarkable.  Appreciate the assistance of urology. - continue IV fluids at 26m/hr - GBM Ab, ANA, Anca negative - C4 normal, C3 low at 78 (normal is 82-167) - Urine output down a little with 702myesterday (0.52m46mg/hr) - Creatine improved to 4.5 this morning - Follow serum creatine, repeat urinalysis to see if hematuria clearing - Now on Cephalexin 500m49mD  Multiple Myeloma: on Lenalidomide maintenance prior to admission, followed with Dr. MagrJana Hakims had recent increasing Kappa and IgG as well as worsening Kappa:Lambda ratio back to levels similar to initial presentation in 01/2014, concerning for progression - 24 hour urine protein elevated to 1840mg58m  - Urine IFE pending - Dr. MagriJana Hakimowing, appreciate his  assistance - Consider renal biopsy if no significant improvement on supportive care - Dexamethasone 20mg 53mx 2 more days (4 days total) - Pantoprazole 20mg B452m Dexamethasone 40mg we552m starting 12/23 as well as above - Stop Lenalidomide - Continue Acyclovir at renal adjusted dose - Tramadol 50mg q6h45m for pain - Zofran prn nausea  Normocytic Anemia - baseline 13-14, now 8.9 on admission from 11.9 only 3 weeks ago. Patient was on Lenalidomide prior to admission, which is frequently associated with hematologic toxicity. However, in the context of his other symptoms, there is a concern for worsening multiple myeloma. -Follow-up CBCs (7.6 today up from 7.5) -Stop Lenalidomide  Maculopapular Bilateral Forearm Rash: this is resolved, likely drug-induced rash, without other symptoms suggestive of systemic allergic reaction -Continue to monitor  Dispo: Disposition is deferred at this time, awaiting improvement of current medical problems.  Anticipated discharge in  approximately 2-3 day(s).   The patient does have a current PCP (Tresa Garter, MD) and does not need an [facility] hospital follow-up appointment after discharge.  The patient does not have transportation limitations that hinder transportation to clinic appointments.  .Services Needed at time of discharge: Y = Yes, Blank = No PT:   OT:   RN:   Equipment:   Other:     LOS: 3 days   Jule Ser, DO 01/25/2015, 11:00 AM

## 2015-01-25 NOTE — Progress Notes (Signed)
Subjective:  Only 700  of UOP - recorded- likely missed a shift- creatinine down more from 5.2 to 4.6. - BUN up but likely from the steroids- cysto unremarkable  Objective Vital signs in last 24 hours: Filed Vitals:   01/24/15 1540 01/24/15 2036 01/25/15 0456 01/25/15 0840  BP: 119/62 121/64 120/83 113/61  Pulse: 61 60 49 65  Temp: 98.2 F (36.8 C) 98 F (36.7 C) 98 F (36.7 C) 98 F (36.7 C)  TempSrc: Oral Oral Oral Oral  Resp: '17 18 16 17  '$ Height:      Weight:  76.25 kg (168 lb 1.6 oz)    SpO2: 96% 97% 97% 100%   Weight change: 4.656 kg (10 lb 4.2 oz)  Intake/Output Summary (Last 24 hours) at 01/25/15 1032 Last data filed at 01/25/15 0840  Gross per 24 hour  Intake   1530 ml  Output    600 ml  Net    930 ml    Assessment/ Plan: Pt is a 39 y.o. yo male who was admitted on 01/22/2015 with worsening renal function (crt 1.04 on 12/6, then 3.1 on 12/20- then over 5 on admit) and hematuria in the setting of undergoing treatment for myeloma - being started on cipro and mobic---> rash Assessment/Plan: 1. Renal-  AKI- differential thought to be AIN- analgesic nephropathy vs worsening myeloma- less likely to be a vasculitis as hematuria predated worsening of renal function- so far neg ANCA, ANA and anti GBM- comp low normal for c4 and low for c3- protein to creatinine ratio is 2.1, 24 hour urine protein is 1800-  actually better than before- not sure why albumin is in the 1's- Creatinine improved now a good amount - good UOP- may eventually need kidney biopsy if does not improve but creatinine today good move in the right direction.  2. Hematuria- unknown etiology- urology did cysto to look at bladder cause of hematuria, was neg- getting hydrated- U/S on 12/22 neg for obstruction- been placed on steroids- cipro and mobic stopped.   I will check another U/A to see is hematuria has cleared. Now on keflex  3. HTN/volume- stable, no meds 4. Anemia- low likely mostly due to myeloma- per  hem 5. Meds-  lower acyclovir for renal dosing    Emily Massar A    Labs: Basic Metabolic Panel:  Recent Labs Lab 01/23/15 0554 01/24/15 0538 01/25/15 0511  NA 142 143 144  K 4.5 4.4 4.5  CL 108 113* 117*  CO2 24 20* 18*  GLUCOSE 128* 129* 147*  BUN 60* 69* 72*  CREATININE 5.36* 5.17* 4.59*  CALCIUM 7.7* 6.7* 6.4*  PHOS 6.9*  --   --    Liver Function Tests:  Recent Labs Lab 01/20/15 0952 01/22/15 1018 01/23/15 0554 01/25/15 0740  AST 22 20  --  18  ALT 22 25  --  32  ALKPHOS 150 191*  --  107  BILITOT 0.42 <0.30  --  0.1*  PROT 7.9 7.6  --  4.8*  ALBUMIN 2.0* 2.0* 1.6* 1.5*   No results for input(s): LIPASE, AMYLASE in the last 168 hours. No results for input(s): AMMONIA in the last 168 hours. CBC:  Recent Labs Lab 01/20/15 0952 01/22/15 1018 01/23/15 0554 01/24/15 0538 01/25/15 0511  WBC 7.2 8.6 7.4 8.9 7.3  NEUTROABS 5.1 7.2*  --  7.2  --   HGB 8.9* 8.9* 7.7* 7.5* 7.6*  HCT 27.3* 27.7* 24.2* 23.3* 23.2*  MCV 96.8 97.9 98.4 98.3 98.3  PLT 333 333 346 341 334   Cardiac Enzymes: No results for input(s): CKTOTAL, CKMB, CKMBINDEX, TROPONINI in the last 168 hours. CBG: No results for input(s): GLUCAP in the last 168 hours.  Iron Studies: No results for input(s): IRON, TIBC, TRANSFERRIN, FERRITIN in the last 72 hours. Studies/Results: No results found. Medications: Infusions:    Scheduled Medications: . acyclovir  400 mg Oral Daily  . cephALEXin  500 mg Oral TID  . dexamethasone  20 mg Intravenous Q12H  . feeding supplement  1 Container Oral BID BM  . heparin  5,000 Units Subcutaneous 3 times per day  . pantoprazole  20 mg Oral BID    have reviewed scheduled and prn medications.  Physical Exam: General: NAD Heart: RRR Lungs: clear Abdomen: soft, non tender Extremities: no edema    01/25/2015,10:32 AM  LOS: 3 days

## 2015-01-26 LAB — RENAL FUNCTION PANEL
ANION GAP: 10 (ref 5–15)
Albumin: 1.6 g/dL — ABNORMAL LOW (ref 3.5–5.0)
BUN: 69 mg/dL — ABNORMAL HIGH (ref 6–20)
CALCIUM: 6.2 mg/dL — AB (ref 8.9–10.3)
CO2: 18 mmol/L — AB (ref 22–32)
CREATININE: 3.67 mg/dL — AB (ref 0.61–1.24)
Chloride: 117 mmol/L — ABNORMAL HIGH (ref 101–111)
GFR calc Af Amer: 22 mL/min — ABNORMAL LOW (ref >60)
GFR calc non Af Amer: 19 mL/min — ABNORMAL LOW (ref >60)
GLUCOSE: 126 mg/dL — AB (ref 65–99)
Phosphorus: 5.7 mg/dL — ABNORMAL HIGH (ref 2.5–4.6)
Potassium: 4.5 mmol/L (ref 3.5–5.1)
SODIUM: 145 mmol/L (ref 135–145)

## 2015-01-26 LAB — URINALYSIS, ROUTINE W REFLEX MICROSCOPIC
Bilirubin Urine: NEGATIVE
GLUCOSE, UA: NEGATIVE mg/dL
KETONES UR: NEGATIVE mg/dL
LEUKOCYTES UA: NEGATIVE
Nitrite: NEGATIVE
PH: 5 (ref 5.0–8.0)
Protein, ur: 30 mg/dL — AB
Specific Gravity, Urine: 1.014 (ref 1.005–1.030)

## 2015-01-26 LAB — URINE MICROSCOPIC-ADD ON

## 2015-01-26 MED ORDER — SODIUM CHLORIDE 0.9 % IV SOLN
INTRAVENOUS | Status: AC
  Administered 2015-01-26: 13:00:00 via INTRAVENOUS

## 2015-01-26 NOTE — Progress Notes (Signed)
Subjective:  Only 725  of UOP - recorded- likely missed some- creatinine down more from 4.6 to 3.7. - BUN down as well-   Objective Vital signs in last 24 hours: Filed Vitals:   01/25/15 0840 01/25/15 1714 01/25/15 2036 01/26/15 0506  BP: 113/61 130/62 126/66 120/71  Pulse: 65 60 51 44  Temp: 98 F (36.7 C) 98.2 F (36.8 C) 98.3 F (36.8 C) 97.9 F (36.6 C)  TempSrc: Oral Oral Oral Oral  Resp: '17 16 16 17  '$ Height:      Weight:   78 kg (171 lb 15.3 oz)   SpO2: 100% 100% 98% 99%   Weight change: 1.75 kg (3 lb 13.7 oz)  Intake/Output Summary (Last 24 hours) at 01/26/15 1001 Last data filed at 01/26/15 0506  Gross per 24 hour  Intake    720 ml  Output    425 ml  Net    295 ml    Assessment/ Plan: Pt is a 39 y.o. yo male who was admitted on 01/22/2015 with worsening renal function (crt 1.04 on 12/6, then 3.1 on 12/20- then over 5 on admit) and hematuria in the setting of undergoing treatment for myeloma - being started on cipro and mobic---> rash Assessment/Plan: 1. Renal-  AKI- differential thought to be AIN- analgesic nephropathy vs worsening myeloma- less likely to be a vasculitis as hematuria predated worsening of renal function- so far neg ANCA, ANA and anti GBM- comp low normal for c4 and low for c3- protein to creatinine ratio is 2.1, 24 hour urine protein is 1800-  actually better than before- not sure why albumin is in the 1's as this amount of proteinuria is not usually associated with nephrotic syndrome- Creatinine improved now a good amount - good UOP- think we may be able to avoid need for renal biopsy even though the cause of hematuria not entirely known.  I would say if crt continues to improve and gets into the 2's this could be followed as an OP 2. Hematuria- unknown etiology- urology did cysto to look at bladder cause of hematuria, was neg- getting hydrated- U/S on 12/22 neg for obstruction- been placed on steroids- cipro and mobic stopped.  Now on keflex.  Still with  hematuria presumably from glomerular source but as long as renal function continues to improve no further action needed to determine cause  3. HTN/volume- stable, no meds 4. Anemia- low likely mostly due to myeloma- per hem 5. Meds-  lower acyclovir for renal dosing 6. Hypocalc- corrected is OK 7. Myeloma- may need more aggressive tx by onc per Dr. Harland German A    Labs: Basic Metabolic Panel:  Recent Labs Lab 01/23/15 0554 01/24/15 0538 01/25/15 0511 01/26/15 0630  NA 142 143 144 145  K 4.5 4.4 4.5 4.5  CL 108 113* 117* 117*  CO2 24 20* 18* 18*  GLUCOSE 128* 129* 147* 126*  BUN 60* 69* 72* 69*  CREATININE 5.36* 5.17* 4.59* 3.67*  CALCIUM 7.7* 6.7* 6.4* 6.2*  PHOS 6.9*  --   --  5.7*   Liver Function Tests:  Recent Labs Lab 01/20/15 0952 01/22/15 1018 01/23/15 0554 01/25/15 0740 01/26/15 0630  AST 22 20  --  18  --   ALT 22 25  --  32  --   ALKPHOS 150 191*  --  107  --   BILITOT 0.42 <0.30  --  0.1*  --   PROT 7.9 7.6  --  4.8*  --  ALBUMIN 2.0* 2.0* 1.6* 1.5* 1.6*   No results for input(s): LIPASE, AMYLASE in the last 168 hours. No results for input(s): AMMONIA in the last 168 hours. CBC:  Recent Labs Lab 01/20/15 0952 01/22/15 1018 01/23/15 0554 01/24/15 0538 01/25/15 0511  WBC 7.2 8.6 7.4 8.9 7.3  NEUTROABS 5.1 7.2*  --  7.2  --   HGB 8.9* 8.9* 7.7* 7.5* 7.6*  HCT 27.3* 27.7* 24.2* 23.3* 23.2*  MCV 96.8 97.9 98.4 98.3 98.3  PLT 333 333 346 341 334   Cardiac Enzymes: No results for input(s): CKTOTAL, CKMB, CKMBINDEX, TROPONINI in the last 168 hours. CBG: No results for input(s): GLUCAP in the last 168 hours.  Iron Studies: No results for input(s): IRON, TIBC, TRANSFERRIN, FERRITIN in the last 72 hours. Studies/Results: No results found. Medications: Infusions: . sodium chloride 75 mL/hr at 01/25/15 2253    Scheduled Medications: . acyclovir  400 mg Oral Daily  . cephALEXin  500 mg Oral TID  . dexamethasone   20 mg Intravenous Q12H  . feeding supplement  1 Container Oral BID BM  . heparin  5,000 Units Subcutaneous 3 times per day  . pantoprazole  20 mg Oral BID    have reviewed scheduled and prn medications.  Physical Exam: General: NAD Heart: RRR Lungs: clear Abdomen: soft, non tender Extremities: no edema    01/26/2015,10:01 AM  LOS: 4 days

## 2015-01-26 NOTE — Progress Notes (Signed)
Patient ID: Rayne Loiseau, male   DOB: 1975/07/20, 39 y.o.   MRN: [id number] Hematology oncology: Definite trend for improvement in his renal function at this time with creatinine down to 3.7. Good urine output. He completes a four-day course of high-dose dexamethasone today. In view of continued improvement, kidney biopsy will be deferred at this time per nephrology. I agree, if renal function continues to improve, he could potentially be discharged tomorrow. He has a follow-up with Dr. Virgie Dad nurse practitioner on January 5. Dr. Jana Hakim will be back in town. I would recommend a second four-day course of Decadron 20 mg twice a day to begin Friday, December 30 to tide him over until a more definitive chemotherapy plan is outlined by Dr. Jana Hakim. Continue PPI, high-dose steroids. If we are dealing with an atypical glomerulonephritis, hopefully the steroids will also treat that. Dr. Lindi Adie will be covering for Dr. Jana Hakim until he returns next week. I am available for any questions or concerns as well.

## 2015-01-26 NOTE — Progress Notes (Signed)
Subjective: Patient seen and examined this morning on rounds.  No acute events overnight.  Patient states feeling better overall, rib pain better, only complaint was some abdominal pain that felt like gas pain last night that has since resolved.  Objective: Vital signs in last 24 hours: Filed Vitals:   01/25/15 0840 01/25/15 1714 01/25/15 2036 01/26/15 0506  BP: 113/61 130/62 126/66 120/71  Pulse: 65 60 51 44  Temp: 98 F (36.7 C) 98.2 F (36.8 C) 98.3 F (36.8 C) 97.9 F (36.6 C)  TempSrc: Oral Oral Oral Oral  Resp: 17 16 16 17   Height:      Weight:   171 lb 15.3 oz (78 kg)   SpO2: 100% 100% 98% 99%   Weight change: 3 lb 13.7 oz (1.75 kg)  Intake/Output Summary (Last 24 hours) at 01/26/15 0948 Last data filed at 01/26/15 0506  Gross per 24 hour  Intake    720 ml  Output    425 ml  Net    295 ml   General: pleasant gentleman, resting in bed, no distress HEENT: EOMI, no scleral icterus Cardiac: RRR, no rubs, murmurs or gallops Pulm: clear to auscultation bilaterally, moving normal volumes of air Abd: soft, nontender, nondistended MSK: tender to palpation on right rib Ext: warm and well perfused, no pedal edema Skin: no rash noted Neuro: alert and oriented X3, no focal deficits  Lab Results: Basic Metabolic Panel:  Recent Labs Lab 01/23/15 0554  01/25/15 0511 01/26/15 0630  NA 142  < > 144 145  K 4.5  < > 4.5 4.5  CL 108  < > 117* 117*  CO2 24  < > 18* 18*  GLUCOSE 128*  < > 147* 126*  BUN 60*  < > 72* 69*  CREATININE 5.36*  < > 4.59* 3.67*  CALCIUM 7.7*  < > 6.4* 6.2*  PHOS 6.9*  --   --  5.7*  < > = values in this interval not displayed. Liver Function Tests:  Recent Labs Lab 01/22/15 1018  01/25/15 0740 01/26/15 0630  AST 20  --  18  --   ALT 25  --  32  --   ALKPHOS 191*  --  107  --   BILITOT <0.30  --  0.1*  --   PROT 7.6  --  4.8*  --   ALBUMIN 2.0*  < > 1.5* 1.6*  < > = values in this interval not displayed. No results for input(s):  LIPASE, AMYLASE in the last 168 hours. No results for input(s): AMMONIA in the last 168 hours. CBC:  Recent Labs Lab 01/22/15 1018  01/24/15 0538 01/25/15 0511  WBC 8.6  < > 8.9 7.3  NEUTROABS 7.2*  --  7.2  --   HGB 8.9*  < > 7.5* 7.6*  HCT 27.7*  < > 23.3* 23.2*  MCV 97.9  < > 98.3 98.3  PLT 333  < > 341 334  < > = values in this interval not displayed. Cardiac Enzymes: No results for input(s): CKTOTAL, CKMB, CKMBINDEX, TROPONINI in the last 168 hours. BNP: No results for input(s): PROBNP in the last 168 hours. D-Dimer: No results for input(s): DDIMER in the last 168 hours. CBG: No results for input(s): GLUCAP in the last 168 hours. Hemoglobin A1C: No results for input(s): HGBA1C in the last 168 hours. Fasting Lipid Panel: No results for input(s): CHOL, HDL, LDLCALC, TRIG, CHOLHDL, LDLDIRECT in the last 168 hours. Thyroid Function Tests: No results  for input(s): TSH, T4TOTAL, FREET4, T3FREE, THYROIDAB in the last 168 hours. Coagulation: No results for input(s): LABPROT, INR in the last 168 hours. Anemia Panel: No results for input(s): VITAMINB12, FOLATE, FERRITIN, TIBC, IRON, RETICCTPCT in the last 168 hours. Urine Drug Screen: Drugs of Abuse  No results found for: LABOPIA, COCAINSCRNUR, LABBENZ, AMPHETMU, THCU, LABBARB  Alcohol Level: No results for input(s): ETH in the last 168 hours. Urinalysis:  Recent Labs Lab 01/22/15 1018 01/26/15 0742  COLORURINE  --  YELLOW  LABSPEC 1.030 1.014  PHURINE 5.0 5.0  GLUCOSEU Negative NEGATIVE  HGBUR Large LARGE*  BILIRUBINUR Negative NEGATIVE  KETONESUR Negative NEGATIVE  PROTEINUR 2000 30*  UROBILINOGEN 0.2  --   NITRITE Negative NEGATIVE  LEUKOCYTESUR Color Interference due to blood in urine NEGATIVE    Micro Results: Recent Results (from the past 240 hour(s))  Urine Culture     Status: None   Collection Time: 01/22/15 10:18 AM  Result Value Ref Range Status   Urine Culture, Routine Culture, Urine  Final     Comment: Final - ===== COLONY COUNT: ===== NO GROWTH NO GROWTH    Studies/Results: No results found. Medications: Scheduled Meds: . acyclovir  400 mg Oral Daily  . cephALEXin  500 mg Oral TID  . dexamethasone  20 mg Intravenous Q12H  . feeding supplement  1 Container Oral BID BM  . heparin  5,000 Units Subcutaneous 3 times per day  . pantoprazole  20 mg Oral BID   Continuous Infusions: . sodium chloride 75 mL/hr at 01/25/15 2253   PRN Meds:.acetaminophen **OR** acetaminophen, senna-docusate, traMADol Assessment/Plan: Principal Problem:   AKI (acute kidney injury) ([place]) Active Problems:   Multiple myeloma ([place])   Prostatitis   Hypoalbuminemia due to protein-calorie malnutrition ([place])   Antineoplastic chemotherapy induced anemia  Acute Kidney Injury: worsening hematuria and proteinuria with baseline creatine of approximately 1 to 5.3 on admission.  Creatine today 3.6 today - renal ultrasound and CT abdomen and pelvis unremarkable for apparent cause showing no evidence of obstruction - Levaquin and Mobic likely contributing as well as contrast received recently for CT that caused kidneys to reach fever pitch - Nephrology following, appreciate their recommendations.  Differential includes acute interstitial nephritis, progression of myleoma. Considering kidney biopsy, however, creatine continues to improve on supportive care - Bedside cystoscopy yesterday unremarkable.  Appreciate the assistance of urology. - continue IV fluids at 78m/hr - GBM Ab, ANA, Anca negative - C4 normal, C3 low at 78 (normal is 82-167) - Urine output 725 mL yesterday (0.476mkg/hr) - Creatine improved to 3.6 this morning.  Was 4.5 yesterday - Follow serum creatine - Repeat urinalysis was yellow color, cloudy appearance, large amount hemoglobin, 308mL protein.  Microscopy revealed too numerous to count RBC/HPF - will discontinue Cephalexin 500m53mD  Multiple Myeloma: on Lenalidomide maintenance prior  to admission, followed with Dr. MagrJana Hakims had recent increasing Kappa and IgG as well as worsening Kappa:Lambda ratio back to levels similar to initial presentation in 01/2014, concerning for progression - 24 hour urine protein elevated to 1840mg70m may be in part due to hemoglobinuria.  Hypoalbuminemia also due to non-selective proteinuria and suppression of albumin production by progressive myleoma - Calcium 6.2, but corrected to 8.2  - Urine IFE pending - Dr. MagriJana Hakimowing, appreciate his assistance - Dexamethasone 20mg 74mx 1 more days (4 days total) - Pantoprazole 20mg B36m Dexamethasone 40mg we64m starting 12/23 as well as above - Stop Lenalidomide - Continue Acyclovir at renal adjusted dose -  Tramadol 70m q6h prn for pain - Zofran prn nausea  Normocytic Anemia - baseline 13-14, now 8.9 on admission from 11.9 only 3 weeks ago. Patient was on Lenalidomide prior to admission, which is frequently associated with hematologic toxicity. However, in the context of his other symptoms, there is a concern for worsening multiple myeloma. -Follow-up CBCs (7.6 yesterday up from 7.5) -Stop Lenalidomide  Maculopapular Bilateral Forearm Rash: this is resolved, likely drug-induced rash, without other symptoms suggestive of systemic allergic reaction -Continue to monitor  Dispo: possible discharge home today pending nephrology recommendations  The patient does have a current PCP (OTresa Garter MD) and [facility].  The patient does not have transportation limitations that hinder transportation to clinic appointments.  .Services Needed at time of discharge: Y = Yes, Blank = No PT:   OT:   RN:   Equipment:   Other:     LOS: 4 days   AJule Ser DO 01/26/2015, 9:48 AM

## 2015-01-26 NOTE — Progress Notes (Signed)
CRITICAL VALUE ALERT  Critical value received:  Ca 6.2  Date of notification:  01/26/15  Time of notification:  0755  Critical value read back:Yes.    Nurse who received alert:  K.Lamia Mariner, RN  MD notified (1st page):  Dr. Juleen China  Time of first page:  0802  Responding MD:  Dr. Juleen China  Time MD responded:  [phone]

## 2015-01-27 LAB — CBC
HEMATOCRIT: 24.7 % — AB (ref 39.0–52.0)
HEMOGLOBIN: 8.2 g/dL — AB (ref 13.0–17.0)
MCH: 32.3 pg (ref 26.0–34.0)
MCHC: 33.2 g/dL (ref 30.0–36.0)
MCV: 97.2 fL (ref 78.0–100.0)
Platelets: 389 10*3/uL (ref 150–400)
RBC: 2.54 MIL/uL — ABNORMAL LOW (ref 4.22–5.81)
RDW: 15.6 % — AB (ref 11.5–15.5)
WBC: 7.9 10*3/uL (ref 4.0–10.5)

## 2015-01-27 LAB — RENAL FUNCTION PANEL
ANION GAP: 9 (ref 5–15)
Albumin: 1.6 g/dL — ABNORMAL LOW (ref 3.5–5.0)
BUN: 64 mg/dL — AB (ref 6–20)
CALCIUM: 6.3 mg/dL — AB (ref 8.9–10.3)
CO2: 18 mmol/L — AB (ref 22–32)
Chloride: 117 mmol/L — ABNORMAL HIGH (ref 101–111)
Creatinine, Ser: 3.03 mg/dL — ABNORMAL HIGH (ref 0.61–1.24)
GFR calc Af Amer: 28 mL/min — ABNORMAL LOW (ref >60)
GFR calc non Af Amer: 24 mL/min — ABNORMAL LOW (ref >60)
GLUCOSE: 116 mg/dL — AB (ref 65–99)
POTASSIUM: 4.8 mmol/L (ref 3.5–5.1)
Phosphorus: 5.5 mg/dL — ABNORMAL HIGH (ref 2.5–4.6)
SODIUM: 144 mmol/L (ref 135–145)

## 2015-01-27 MED ORDER — DEXAMETHASONE 4 MG PO TABS: 20.0000 mg | ORAL_TABLET | Freq: Two times a day (BID) | ORAL | Status: DC

## 2015-01-27 MED ORDER — ACYCLOVIR 200 MG PO CAPS: 400.0000 mg | ORAL_CAPSULE | Freq: Every day | ORAL | Status: DC

## 2015-01-27 MED ORDER — PANTOPRAZOLE SODIUM 20 MG PO TBEC: 20.0000 mg | DELAYED_RELEASE_TABLET | Freq: Two times a day (BID) | ORAL | Status: DC

## 2015-01-27 NOTE — Progress Notes (Signed)
Subjective: Patient seen and examined this morning on rounds.  No acute events overnight.  Patient states feeling better with no complaints.  Denies pain, chest pain, abdominal pain, SOB.  Objective: Vital signs in last 24 hours: Filed Vitals:   01/26/15 1724 01/26/15 2030 01/27/15 0523 01/27/15 0850  BP: 123/69 132/62 144/68 119/62  Pulse: 51 43 44 57  Temp: 97.4 F (36.3 C) 97.6 F (36.4 C) 98 F (36.7 C) 98.1 F (36.7 C)  TempSrc: Oral Oral Oral Oral  Resp: 18 18 18 16   Height:      Weight:   169 lb 15.6 oz (77.1 kg)   SpO2: 98% 99% 99% 98%   Weight change: -1 lb 15.8 oz (-0.9 kg)  Intake/Output Summary (Last 24 hours) at 01/27/15 1311 Last data filed at 01/27/15 0851  Gross per 24 hour  Intake    720 ml  Output   1400 ml  Net   -680 ml   General: pleasant Spanish speaking gentleman, resting in bed, no distress HEENT: EOMI, no scleral icterus Cardiac: RRR, no rubs, murmurs or gallops Pulm: clear to auscultation bilaterally, moving normal volumes of air Abd: soft, nontender, nondistended Ext: warm and well perfused, no pedal edema Skin: no rash noted Neuro: alert and oriented X3, no focal deficits  Lab Results: Basic Metabolic Panel:  Recent Labs Lab 01/26/15 0630 01/27/15 0605  NA 145 144  K 4.5 4.8  CL 117* 117*  CO2 18* 18*  GLUCOSE 126* 116*  BUN 69* 64*  CREATININE 3.67* 3.03*  CALCIUM 6.2* 6.3*  PHOS 5.7* 5.5*   Liver Function Tests:  Recent Labs Lab 01/22/15 1018  01/25/15 0740 01/26/15 0630 01/27/15 0605  AST 20  --  18  --   --   ALT 25  --  32  --   --   ALKPHOS 191*  --  107  --   --   BILITOT <0.30  --  0.1*  --   --   PROT 7.6  --  4.8*  --   --   ALBUMIN 2.0*  < > 1.5* 1.6* 1.6*  < > = values in this interval not displayed. No results for input(s): LIPASE, AMYLASE in the last 168 hours. No results for input(s): AMMONIA in the last 168 hours. CBC:  Recent Labs Lab 01/22/15 1018  01/24/15 0538 01/25/15 0511 01/27/15 0605   WBC 8.6  < > 8.9 7.3 7.9  NEUTROABS 7.2*  --  7.2  --   --   HGB 8.9*  < > 7.5* 7.6* 8.2*  HCT 27.7*  < > 23.3* 23.2* 24.7*  MCV 97.9  < > 98.3 98.3 97.2  PLT 333  < > 341 334 389  < > = values in this interval not displayed. Cardiac Enzymes: No results for input(s): CKTOTAL, CKMB, CKMBINDEX, TROPONINI in the last 168 hours. BNP: No results for input(s): PROBNP in the last 168 hours. D-Dimer: No results for input(s): DDIMER in the last 168 hours. CBG: No results for input(s): GLUCAP in the last 168 hours. Hemoglobin A1C: No results for input(s): HGBA1C in the last 168 hours. Fasting Lipid Panel: No results for input(s): CHOL, HDL, LDLCALC, TRIG, CHOLHDL, LDLDIRECT in the last 168 hours. Thyroid Function Tests: No results for input(s): TSH, T4TOTAL, FREET4, T3FREE, THYROIDAB in the last 168 hours. Coagulation: No results for input(s): LABPROT, INR in the last 168 hours. Anemia Panel: No results for input(s): VITAMINB12, FOLATE, FERRITIN, TIBC, IRON, RETICCTPCT in the  last 168 hours. Urine Drug Screen: Drugs of Abuse  No results found for: LABOPIA, COCAINSCRNUR, LABBENZ, AMPHETMU, THCU, LABBARB  Alcohol Level: No results for input(s): ETH in the last 168 hours. Urinalysis:  Recent Labs Lab 01/22/15 1018 01/26/15 0742  COLORURINE  --  YELLOW  LABSPEC 1.030 1.014  PHURINE 5.0 5.0  GLUCOSEU Negative NEGATIVE  HGBUR Large LARGE*  BILIRUBINUR Negative NEGATIVE  KETONESUR Negative NEGATIVE  PROTEINUR 2000 30*  UROBILINOGEN 0.2  --   NITRITE Negative NEGATIVE  LEUKOCYTESUR Color Interference due to blood in urine NEGATIVE    Micro Results: Recent Results (from the past 240 hour(s))  Urine Culture     Status: None   Collection Time: 01/22/15 10:18 AM  Result Value Ref Range Status   Urine Culture, Routine Culture, Urine  Final    Comment: Final - ===== COLONY COUNT: ===== NO GROWTH NO GROWTH    Studies/Results: No results found. Medications: Scheduled Meds: .  acyclovir  400 mg Oral Daily  . feeding supplement  1 Container Oral BID BM  . heparin  5,000 Units Subcutaneous 3 times per day  . pantoprazole  20 mg Oral BID   Continuous Infusions:   PRN Meds:.acetaminophen **OR** acetaminophen, senna-docusate, traMADol Assessment/Plan: Principal Problem:   AKI (acute kidney injury) ([place]) Active Problems:   Multiple myeloma ([facility])   Prostatitis   Hypoalbuminemia due to protein-calorie malnutrition ([facility])   Antineoplastic chemotherapy induced anemia  Acute Kidney Injury: worsening hematuria and proteinuria with baseline creatine of approximately 1 to 5.3 on admission.  Creatine today 3.0 today - renal ultrasound and CT abdomen and pelvis unremarkable for apparent cause showing no evidence of obstruction - Levaquin and Mobic likely contributed as well as contrast received recently for CT that caused kidneys to reach fever pitch - Nephrology following, appreciate their recommendations.  They are okay with discharge and follow up labs.  They will also arrange close follow up at their office.   Multiple Myeloma: on Lenalidomide maintenance prior to admission, followed with Dr. Jana Hakim. Has had recent increasing Kappa and IgG as well as worsening Kappa:Lambda ratio back to levels similar to initial presentation in 01/2014, concerning for progression - 24 hour urine protein elevated to 1852m/day may be in part due to hemoglobinuria.  Hypoalbuminemia also due to non-selective proteinuria and suppression of albumin production by progressive myleoma - will discharge home today with a 2nd four day course of Decadron 240mBID to begin 01/30/2015 - patient to follow up with Dr. MaJana Hakimanuary 5 - Urine IFE pending - continue PPI at discharge - Stop Lenalidomide - Continue Acyclovir at renal adjusted dose  Normocytic Anemia - baseline 13-14, now 8.9 on admission from 11.9 only 3 weeks ago. Patient was on Lenalidomide prior to admission, which is frequently  associated with hematologic toxicity. However, in the context of his other symptoms, there is a concern for worsening multiple myeloma. - Stable  - Stop Lenalidomide  Maculopapular Bilateral Forearm Rash: this is resolved, likely drug-induced rash, without other symptoms suggestive of systemic allergic reaction - Continue to monitor  Dispo: possible discharge home today pending nephrology recommendations  The patient does have a current PCP (OlTresa GarterMD) and [facility].  The patient does not have transportation limitations that hinder transportation to clinic appointments.  .Services Needed at time of discharge: Y = Yes, Blank = No PT:   OT:   RN:   Equipment:   Other:  LOS: 5 days   Jule Ser, DO 01/27/2015, 1:11 PM

## 2015-01-27 NOTE — Discharge Instructions (Signed)
Lesin renal aguda (Acute Kidney Injury) La lesin renal aguda es cualquier afeccin en la que haya un dao repentino (agudo) en los riones. La lesin renal aguda anteriormente se conoca como insuficiencia renal aguda. Los riones son dos rganos que se encuentran a cada lado de la columna vertebral entre el centro de la espalda y la parte de adelante del abdomen. Los riones:  [employer] y el exceso de agua de la [place].  Producen hormonas importantes. Ayudan a [employer], a regular la presin arterial y a formar glbulos rojos.  Regulan los lquidos y los productos qumicos en la sangre y los tejidos. Un dao pequeo puede no causar problemas, pero un dao mayor puede impedir que los riones funcionen de la [place]. La lesin renal aguda puede progresar y causar una enfermedad renal crnica. Tambin puede dar lugar a una enfermedad potencialmente mortal llamada enfermedad renal en etapa terminal. La lesin renal aguda puede empeorar muy rpidamente, por lo que debe tratarse de inmediato. El tratamiento inicial puede prevenir el desarrollo de otras enfermedades renales. CAUSAS   Problemas con el flujo sanguneo a los riones. Las [employer] ser las siguientes:  [place].  Enfermedades cardacas.  Quemaduras graves.  Enfermedad heptica.  Lesin directa en el rin. Las causas pueden ser las siguientes:  Algunos medicamentos.  Infeccin renal.  Intoxicacin o consumo de sustancias txicas.  Una herida quirrgica.  Golpe en la zona renal.  Problemas en el flujo de orina. Las causas pueden ser las siguientes:  Hotel manager.  Clculos en el rin.  Aumento del tamao de la prstata. SIGNOS Y SNTOMAS   Hinchazn (edema) de las piernas, los tobillos o los pies.  Cansancio (letargo).  Nuseas o vmitos.  Confusin.  Problemas en la miccin, tales como:  Sensacin de Social research officer, government o ardor al Garment/textile technologist.  Disminucin de la produccin de  [place].  Accidentes frecuentes en los nios que saben ir al bao solos (controlan los esfnteres).  Sangre en la orina.  Contracciones o calambres musculares.  Falta de aire.  Convulsiones.  Dolor u opresin en el pecho En algunos casos no hay sntomas.  DIAGNSTICO La lesin renal aguda se Emergency planning/management officer y diagnosticar mediante estudios, que incluyen anlisis de [place] o de [place], diagnstico por imgenes o una biopsia de rin.  TRATAMIENTO El tratamiento de la lesin renal aguda vara segn la causa y la gravedad del dao renal. En los casos leves, puede no ser necesario el [place]. Los riones pueden curarse por s solos. Si la lesin renal aguda es ms grave, el mdico tratar la causa, le dar un tratamiento para reparar los riones y Product/process development scientist las complicaciones. Los [employer] graves pueden requerir un procedimiento para [employer] desechos txicos del cuerpo (dilisis) o una ciruga para reparar el dao renal. [place] consiste en lo siguiente:   Reparar un rin daado.  Eliminar una obstruccin. INSTRUCCIONES PARA EL CUIDADO EN EL HOGAR  Siga la dieta que le han indicado.  Tome los medicamentos solamente como se lo haya indicado el mdico.  No utilice ningn medicamento nuevo (recetado, de venta libre o suplementos nutricionales), excepto si el mdico lo aprueba. Muchos medicamentos pueden empeorar la enfermedad renal o necesitar un ajuste de la dosis.  Concurra a todas las visitas de control como se lo haya indicado el mdico. Esto es importante.  Controle la afeccin para asegurarse de que est evolucionando segn lo previsto. SOLICITE ATENCIN MDICA DE INMEDIATO SI:  Se siente enfermo o tiene  un dolor intenso en el costado del cuerpo o en la espalda.  Los sntomas regresan o tiene sntomas nuevos.  Tiene cualquiera de los sntomas de la enfermedad renal en etapa terminal. Estos incluyen los siguientes:  Picazn persistente.  Prdida del apetito.  Dolores de  [place].  La piel se oscurece o se aclara de manera anormal.  Entumecimiento en las manos o en los pies.  Aparecen hematomas con facilidad.  Hipo frecuente.  Se detiene la menstruacin.  Tiene fiebre.  Tiene mayor produccin de [place].  Siente dolor o tiene sangrado al [employer]. ASEGRESE DE QUE:   Comprende estas instrucciones.  Controlar su afeccin.  Recibir ayuda de inmediato si no mejora o si empeora.   Esta informacin no tiene Marine scientist el consejo del mdico. Asegrese de hacerle al mdico cualquier pregunta que tenga.   Document Released: 01/04/2012 Document Revised: 02/07/2014 Elsevier Interactive Patient Education [employer].

## 2015-01-27 NOTE — Discharge Summary (Signed)
Name: Charles [name] MRN: [id number] DOB: [birth year] 39 y.o. PCP: Charles Garter, MD  Date of Admission: 01/22/2015  1:13 PM Date of Discharge: 01/27/2015 Attending Physician: No att. providers found  Discharge Diagnosis:  Principal Problem:   AKI (acute kidney injury) ([place]) Active Problems:   Multiple myeloma ([place])   Prostatitis   Hypoalbuminemia due to protein-calorie malnutrition ([place])   Antineoplastic chemotherapy induced anemia  Discharge Medications:   Medication List    STOP taking these medications        acyclovir 400 MG tablet  Commonly known as:  ZOVIRAX  Replaced by:  acyclovir 200 MG capsule     lenalidomide 10 MG capsule  Commonly known as:  REVLIMID     meloxicam 15 MG tablet  Commonly known as:  MOBIC     methylPREDNISolone 4 MG Tbpk tablet  Commonly known as:  MEDROL DOSEPAK     nitrofurantoin (macrocrystal-monohydrate) 100 MG capsule  Commonly known as:  MACROBID      TAKE these medications        acyclovir 200 MG capsule  Commonly known as:  ZOVIRAX  Take 2 capsules (400 mg total) by mouth daily.     dexamethasone 4 MG tablet  Commonly known as:  DECADRON  Take 5 tablets (20 mg total) by mouth 2 (two) times daily. Start taking on January 30, 2015     diphenhydrAMINE 25 MG tablet  Commonly known as:  SOMINEX  Take 25 mg by mouth at bedtime as needed for allergies.     erythromycin ophthalmic ointment  Place 1 application into the right eye 4 (four) times daily.     fluconazole 100 MG tablet  Commonly known as:  DIFLUCAN  Take 1 tablet (100 mg total) by mouth daily.     ondansetron 8 MG tablet  Commonly known as:  ZOFRAN  Take 1 tablet (8 mg total) by mouth every 8 (eight) hours as needed for nausea or vomiting.     pantoprazole 20 MG tablet  Commonly known as:  PROTONIX  Take 1 tablet (20 mg total) by mouth 2 (two) times daily.     traMADol 50 MG tablet  Commonly known as:  ULTRAM  Take 1 tablet (50 mg total) by  mouth every 6 (six) hours as needed.        Disposition and follow-up:   Charles [name] was discharged from [facility] in Stable condition.    1.  At the hospital follow up visit please address:  - patient needs repeat BMET to ensure resolution of acute kidney injury - patient to complete 4 day course of Decadron 29m BID starting 01/30/2015 - patient to follow up with Dr. MJana Hakimon 02/05/2015  2.  Labs / imaging needed at time of follow-up: BMET  3.  Pending labs/ test needing follow-up: urine IFE  Follow-up Appointments:   Discharge Instructions:   Consultations: Treatment Team:  MFleet Contras MD SBjorn Loser MD  Procedures Performed:  Dg Chest 2 View  01/01/2015  CLINICAL DATA:  Nausea and vomiting, multiple myeloma EXAM: CHEST  2 VIEW COMPARISON:  12/28/2014 FINDINGS: The heart size and mediastinal contours are within normal limits. Both lungs are clear. The visualized skeletal structures are unremarkable. IMPRESSION: No active cardiopulmonary disease. Electronically Signed   By: Charles [name]  Shick M.D.   On: 01/01/2015 16:40   Dg Ribs Unilateral W/chest Right  01/20/2015  CLINICAL DATA:  Multiple myeloma with right-sided chest pain EXAM: RIGHT RIBS AND CHEST -  3+ VIEW COMPARISON:  CT chest 02/03/2014. Chest CT 01/30/2014. Chest x-ray 01/05/2014 FINDINGS: Heart size within normal limits. Negative for heart failure. Lungs are clear without infiltrate or effusion Extrapleural soft tissue thickening in the right lung apex. This has been present on prior studies and may be due to scarring. There is a chronic healed fracture of the right anterior second rib which was noted on prior studies. The right second rib also shows abnormal infiltrative low density on CT suggestive of myeloma. No definite bony destruction is seen on the current study. Chronic fracture of the right anterior fifth rib unchanged from prior studies. Negative for acute fracture.  IMPRESSION: No acute cardiopulmonary disease. Chronic healed fractures right second and fifth ribs anteriorly. Right apical pleural density is similar prior studies and may be due to benign pleural thickening. Based on the prior CT there could be myeloma in the right second rib however no bony destruction is identified on the current study. Electronically Signed   By: Charles [name] M.D.   On: 01/20/2015 15:27   US Renal  01/22/2015  CLINICAL DATA:  Renal failure.  Chronic kidney disease stage 3. EXAM: RENAL / URINARY TRACT ULTRASOUND COMPLETE COMPARISON:  01/01/2015 FINDINGS: Right Kidney: Length: 11.1. Increased parenchymal echogenicity. No mass or hydronephrosis visualized. Left Kidney: Length: 12.2 cm. Increased parenchymal echogenicity. There is a cyst arising from the upper pole measuring 1.1 x 1.2 x 0.9 cm peer no mass or hydronephrosis. Bladder: Appears normal for degree of bladder distention. IMPRESSION: 1. No acute findings 2. Bilateral echogenic kidneys compatible with chronic medical renal disease. 3. Left kidney cyst. Electronically Signed   By: Kerby Moors M.D.   On: 01/22/2015 14:23   US Renal  01/01/2015  CLINICAL DATA:  Hematuria EXAM: RENAL / URINARY TRACT ULTRASOUND COMPLETE COMPARISON:  12/28/2014 CT abdomen FINDINGS: Right Kidney: Length: 11.5 cm. Increased renal cortical echogenicity. No mass or hydronephrosis visualized. Left Kidney: Length: 12.0 cm. Increased renal cortical echogenicity. 1.1 x 0.8 x 1.2 cm anechoic left interpolar renal mass most consistent with a small cyst. No solid mass or hydronephrosis visualized. Bladder: Appears normal for degree of bladder distention. IMPRESSION: 1. Increased renal cortical echogenicity bilaterally as can be seen with medical renal disease. 2. No nephrolithiasis. Electronically Signed   By: Kathreen Devoid   On: 01/01/2015 16:08    2D Echo: none  Cardiac Cath: none  Admission HPI: Charles [name] is a 39yo Spanish-speaking M  with PMH of multiple myeloma (stage IIA diagnosed January 2016 initially on CyBorD, now on lenalidomide and zoledronic acid) who presents to the ED after being sent from his oncology clinic for acutely worsening renal function. Starting one month ago, he started having worsening malaise, fatigue, nausea with occasional vomiting and painless gross hematuria. He was referred to Dr. Vikki Ports in urology on 01/09/2015, where he was diagnosed with prostatitis and started on cipro and mobic. CT A/P and renal ultrasound 1 month ago were After taking both medicines for 1 week, he developed a rash on his neck and forearms without other symptoms, and discontinued both medicines at his oncologist's office yesterday. The rash improved greatly with benadryl, pepcid, and a medrol dose pack. He had been vomiting intermittently over the past couple of days and was felt to be dehydrated, evidenced by a rising creatinine to 3.1 and K+ 3.4. He was given IV fluids and zofran in the office yesterday. He returned to clinic for re-check today and his creatinine rose even further to 5.3  so he was sent to the ED for further evaluation and management.   Over the past 1 month, besides the above symptoms, he also notes occasional night sweats and subjective chills as well as increased urinary urgency and mild testicular pain only when he lifts heavy objects. He denies headache, vision changes, chest pain, shortness of breath, palpitations, dysuria, perineal pain/pain with sitting, changes in bowel movement, recent travel, or sick contacts. Additionally, he has noted worsening right lateral rib pain, with plain film yesterday showing possibility of myeloma involvement in the R 2nd rib. He also endorses moderate R hip pain that is chronic and unchanged.   Hospital Course by problem list: Principal Problem:   AKI (acute kidney injury) ([place]) Active Problems:   Multiple myeloma ([place])   Prostatitis   Hypoalbuminemia due to protein-calorie  malnutrition ([place])   Antineoplastic chemotherapy induced anemia   Acute Kidney Injury: worsening hematuria and proteinuria with baseline creatine of approximately 1 to 5.3 on admission. Creatine 3.0 on day of discharge and has steadily improved on a daily basis with supportive care.  A renal ultrasound and CT abdomen and pelvis unremarkable for apparent cause showing no evidence of obstruction.  Levaquin and Mobic likely contributed as well as contrast received recently for CT that caused kidneys to reach fever pitch and cause this acute kidney injury.  Additionally, progression of myeloma may have contributed as well as patient had renal dysfunction at diagnosis which normalized when he responded to myeloma treatment.  His creatine just started to rise again in late November when he developed the gross hematuria and had contrast CT and subsequent NSAID and antibitoics for presumed prostatitis.  Nephrology followed throughout hospital course and we appreciate their recommendations. They will arrange for close follow up in their office as well patient has follow up in oncology office.   Multiple Myeloma: on Lenalidomide maintenance prior to admission, followed with Dr. Jana Hakim. Has had recent increasing Kappa and IgG as well as worsening Kappa:Lambda ratio back to levels similar to initial presentation in 01/2014, concerning for progression.  A 24 hour urine protein elevated to 1863m/day may be in part due to hemoglobinuria. Hypoalbuminemia also due to non-selective proteinuria and suppression of albumin production by progressive myleoma.  Will discharge home with a 2nd four day course of Decadron 249mBID to begin 01/30/2015.  Patient to follow up with Dr. MaJana Hakimanuary 5.  Urine IFE pending, continue PPI at discharge. Stop Lenalidomide.  Continue Acyclovir at renal adjusted dose  Normocytic Anemia - baseline 13-14, now 8.9 on admission from 11.9 only 3 weeks ago. Patient was on Lenalidomide prior to  admission, which is frequently associated with hematologic toxicity. However, in the context of his other symptoms, there is a concern for worsening multiple myeloma.  Stable.  Stop Lenalidomide  Maculopapular Bilateral Forearm Rash: this is resolved, likely drug-induced rash, without other symptoms suggestive of systemic allergic reaction   Discharge Vitals:   BP 119/62 mmHg  Pulse 57  Temp(Src) 98.1 F (36.7 C) (Oral)  Resp 16  Ht _0  (1.6 m)  Wt 169 lb 15.6 oz (77.1 kg)  BMI 30.12 kg/m2  SpO2 98%  Discharge Labs:  Results for orders placed or performed during the hospital encounter of 01/22/15 (from the past 24 hour(s))  Renal function panel     Status: Abnormal   Collection Time: 01/27/15  6:05 AM  Result Value Ref Range   Sodium 144 135 - 145 mmol/L   Potassium 4.8 3.5 - 5.1  mmol/L   Chloride 117 (H) 101 - 111 mmol/L   CO2 18 (L) 22 - 32 mmol/L   Glucose, Bld 116 (H) 65 - 99 mg/dL   BUN 64 (H) 6 - 20 mg/dL   Creatinine, Ser 3.03 (H) 0.61 - 1.24 mg/dL   Calcium 6.3 (LL) 8.9 - 10.3 mg/dL   Phosphorus 5.5 (H) 2.5 - 4.6 mg/dL   Albumin 1.6 (L) 3.5 - 5.0 g/dL   GFR calc non Af Amer 24 (L) >60 mL/min   GFR calc Af Amer 28 (L) >60 mL/min   Anion gap 9 5 - 15  CBC     Status: Abnormal   Collection Time: 01/27/15  6:05 AM  Result Value Ref Range   WBC 7.9 4.0 - 10.5 K/uL   RBC 2.54 (L) 4.22 - 5.81 MIL/uL   Hemoglobin 8.2 (L) 13.0 - 17.0 g/dL   HCT 24.7 (L) 39.0 - 52.0 %   MCV 97.2 78.0 - 100.0 fL   MCH 32.3 26.0 - 34.0 pg   MCHC 33.2 30.0 - 36.0 g/dL   RDW 15.6 (H) 11.5 - 15.5 %   Platelets 389 150 - 400 K/uL    Signed: Jule Ser, DO 01/27/2015, 4:38 PM    Services Ordered on Discharge: none Equipment Ordered on Discharge: none

## 2015-01-27 NOTE — Progress Notes (Signed)
Subjective:  SCr further improved.   UOP adequate.  Electrolytes stable.  Feels well, no complaints.    Objective Vital signs in last 24 hours: Filed Vitals:   01/26/15 1724 01/26/15 2030 01/27/15 0523 01/27/15 0850  BP: 123/69 132/62 144/68 119/62  Pulse: 51 43 44 57  Temp: 97.4 F (36.3 C) 97.6 F (36.4 C) 98 F (36.7 C) 98.1 F (36.7 C)  TempSrc: Oral Oral Oral Oral  Resp: '18 18 18 16  '$ Height:      Weight:   77.1 kg (169 lb 15.6 oz)   SpO2: 98% 99% 99% 98%   Weight change: -0.9 kg (-1 lb 15.8 oz)  Intake/Output Summary (Last 24 hours) at 01/27/15 1128 Last data filed at 01/27/15 0851  Gross per 24 hour  Intake    720 ml  Output   1400 ml  Net   -680 ml    Assessment/ Plan: Pt is a 39 y.o. yo male who was admitted on 01/22/2015 with worsening renal function (crt 1.04 on 12/6, then 3.1 on 12/20- then over 5 on admit) and hematuria in the setting of undergoing treatment for myeloma - being started on cipro and mobic---> rash Assessment/Plan: 1. Renal-  AKI- differential thought to be AIN- analgesic nephropathy vs worsening myeloma- less likely to be a vasculitis as hematuria predated worsening of renal function- so far neg ANCA, ANA and anti GBM- comp low normal for c4 and low for c3- protein to creatinine ratio is 2.1, 24 hour urine protein is 1800-  actually better than before- not sure why albumin is in the 1's as this amount of proteinuria is not usually associated with nephrotic syndrome-  Pt with ongoing improvement today.  OK with discharge and follow labs (can be done from our office, but might be better if closely followed at [facility]).  Will also arrange close f/u at our office.   2. Hematuria- unknown etiology- urology did cysto to look at bladder cause of hematuria, was neg- getting hydrated- U/S on 12/22 neg for obstruction- been placed on steroids- cipro and mobic stopped.  Now on keflex.  Still with hematuria presumably from glomerular source but as long as renal  function continues to improve no further action needed to determine cause 3. HTN/volume- stable, no meds 4. Anemia- low likely mostly due to myeloma- per hem 5. Meds-  lower acyclovir for renal dosing 6. Hypocalc- corrected is OK 7. Myeloma- may need more aggressive tx by onc per Dr. Lavone Neri, [facility] Butts B    Labs: Basic Metabolic Panel:  Recent Labs Lab 01/23/15 0554  01/25/15 0511 01/26/15 0630 01/27/15 0605  NA 142  < > 144 145 144  K 4.5  < > 4.5 4.5 4.8  CL 108  < > 117* 117* 117*  CO2 24  < > 18* 18* 18*  GLUCOSE 128*  < > 147* 126* 116*  BUN 60*  < > 72* 69* 64*  CREATININE 5.36*  < > 4.59* 3.67* 3.03*  CALCIUM 7.7*  < > 6.4* 6.2* 6.3*  PHOS 6.9*  --   --  5.7* 5.5*  < > = values in this interval not displayed. Liver Function Tests:  Recent Labs Lab 01/22/15 1018  01/25/15 0740 01/26/15 0630 01/27/15 0605  AST 20  --  18  --   --   ALT 25  --  32  --   --   ALKPHOS 191*  --  107  --   --  BILITOT <0.30  --  0.1*  --   --   PROT 7.6  --  4.8*  --   --   ALBUMIN 2.0*  < > 1.5* 1.6* 1.6*  < > = values in this interval not displayed. No results for input(s): LIPASE, AMYLASE in the last 168 hours. No results for input(s): AMMONIA in the last 168 hours. CBC:  Recent Labs Lab 01/22/15 1018  01/23/15 0554 01/24/15 0538 01/25/15 0511 01/27/15 0605  WBC 8.6  < > 7.4 8.9 7.3 7.9  NEUTROABS 7.2*  --   --  7.2  --   --   HGB 8.9*  < > 7.7* 7.5* 7.6* 8.2*  HCT 27.7*  < > 24.2* 23.3* 23.2* 24.7*  MCV 97.9  --  98.4 98.3 98.3 97.2  PLT 333  < > 346 341 334 389  < > = values in this interval not displayed. Cardiac Enzymes: No results for input(s): CKTOTAL, CKMB, CKMBINDEX, TROPONINI in the last 168 hours. CBG: No results for input(s): GLUCAP in the last 168 hours.  Iron Studies: No results for input(s): IRON, TIBC, TRANSFERRIN, FERRITIN in the last 72 hours. Studies/Results: No results found. Medications: Infusions: . sodium chloride Stopped  (01/27/15 1044)    Scheduled Medications: . acyclovir  400 mg Oral Daily  . feeding supplement  1 Container Oral BID BM  . heparin  5,000 Units Subcutaneous 3 times per day  . pantoprazole  20 mg Oral BID    have reviewed scheduled and prn medications.  Physical Exam: General: NAD Heart: RRR Lungs: clear Abdomen: soft, non tender Extremities: no edema   01/27/2015,11:28 AM  LOS: 5 days

## 2015-01-27 NOTE — Progress Notes (Signed)
CRITICAL VALUE ALERT  Critical value received: Ca 6.3  Date of notification: 01/27/15  Time of notification: 7:00am  Critical value read back: Yes  Nurse who received alert: Izic Stfort  MD notified (1st page): Yes  Time of first page: 7:02am  MD notified (2nd page):  Time of second page:  Responding MD:    Time MD responded:

## 2015-01-27 NOTE — Progress Notes (Signed)
Discharge instructions given, pt and wife verbalized understanding. VSS. Denies pain.  Prescriptions sent to [facility].

## 2015-01-28 ENCOUNTER — Ambulatory Visit
Admission: RE | Admit: 2015-01-28 | Discharge: 2015-01-28 | Disposition: A | Discharge status: Home / Self Care | Payer: Self-pay | Source: Ambulatory Visit | Attending: Radiation Oncology | Admitting: Radiation Oncology

## 2015-01-28 DIAGNOSIS — C9002 Multiple myeloma in relapse: Secondary | ICD-10-CM

## 2015-01-28 DIAGNOSIS — C9000 Multiple myeloma not having achieved remission: Secondary | ICD-10-CM | POA: Insufficient documentation

## 2015-01-28 LAB — UIFE/LIGHT CHAINS/TP QN, 24-HR UR
% BETA, Urine: 18.9 %
ALBUMIN, U: 43.2 %
ALPHA 1 URINE: 0.5 %
ALPHA 2 UR: 4.4 %
FREE LAMBDA LT CHAINS, UR: 7.39 mg/L — AB (ref 0.24–6.66)
FREE LT CHN EXCR RATE: 250 mg/L — AB (ref 1.35–24.19)
Free Kappa/Lambda Ratio: 33.83 — ABNORMAL HIGH (ref 2.04–10.37)
GAMMA GLOBULIN URINE: 33 %
M-SPIKE %, Urine: 8 % — ABNORMAL HIGH
M-Spike, Mg/24 Hr: 48 mg/24 hr — ABNORMAL HIGH
TIME-UPE24: 24 h
TOTAL PROTEIN, URINE-UPE24: 45.1 mg/dL
Total Protein, Urine-Ur/day: 599.8 mg/24 hr — ABNORMAL HIGH (ref 30.0–150.0)
VOLUME, URINE-UPE24: 2000 mL

## 2015-01-28 NOTE — Progress Notes (Signed)
[name] here for consult has a history of multiple myeloma.  Reports pain level of 7 this afternoon to the lower right side.  He has a decreased appetite of one week.  Energy level is low. Recent UTI, Rash and abdominal pain was treated with ABT  01-22-15.  BP 126/75 mmHg  Pulse 80  Temp(Src) 97.6 F (36.4 C) (Oral)  Ht _0  (1.6 m)  Wt 163 lb 12.8 oz (74.299 kg)  BMI 29.02 kg/m2  SpO2 100%

## 2015-01-28 NOTE — Progress Notes (Signed)
  Radiation Oncology         [id number]) [phone] ________________________________  Initial Outpatient Consultation - Date: 01/28/2015   Name: [name] MRN: [id number]   DOB: [birth year]     REFERRING PHYSICIAN: Curt Bears, MD  DIAGNOSIS AND STAGE: Multiple myeloma [facility])   Staging form: Multiple Myeloma, AJCC 6th Edition     Clinical: Stage IIIA - Unsigned  HISTORY OF PRESENT ILLNESS::Charles [name] is a 39 y.o. male  presented to urgent care in December of last year with chest and back pain. He was treated with anti-inflammatories initially and later presented to the ER with pain and weight loss. Bone scan of Janurary 3rd of this year showed numerous legions in the skull, ribs, spine,sternum and pelvis.A right iliac bone marrow biopsy on April 20th, showed multiple myeloma. He has been treated with multiple medical therapies and was recently admitted to the hospital on December 24th with complaints of renal of failure and worsening myeloma. He was started on Decadron and discharged. He was originally scheduled to see me earlier this month but was admitted. A CT of the abdomen/pelvis on Nov 27th showed no osseus abnormalties. Plain film of ribs on December 20th showed no definitive fracture of the right rib.  He has a follow up with medical oncology on Janurary 5th, 2017.   Reports pain level of 7 this afternoon to the lower right side.  The patient denies any recent physical trauma to his ribs.  Patient states he is able to sleep on his right side, patient states that his pain tolerable today.  The patient denies taking pain medication today  PREVIOUS RADIATION THERAPY: No  Past medical, social and family history were reviewed in the electronic chart. Review of symptoms was reviewed in the electronic chart. Medications were reviewed in the electronic chart.   PHYSICAL EXAM:  Vitals with Age-Percentiles 01/28/2015  Length 053 cm  Systolic 976  Diastolic 75  MAP   Pulse  80  Respiration   Weight 74.299 kg  BMI 29.1   Alert and Oriented   IMPRESSION: The patient has been diagnosed with multiple myeloma with right rib pain now resolved.   PLAN: We discussed the possible side effects and risks of treatment in addition to the possible benefits of treatment. We discussed the protocol for radiation treatment.  All of the patient's questions were answered.  It appears he had a rib fracture that is healed. He feels his pain is improving and is not taking pain medication on a regular basis. I don't think he would benefit from radiation at this time. We discussed differences between local and systemic treatment. I have given the patient my card and instructions to call if he has more significant pain in the future. I have asked social workers to meet with him and his wife. I will see him back PRN.   The patient will continue follow up with medical oncology.   I spent 40 minutes  face to face with the patient and more than 50% of that time was spent in counseling and/or coordination of care.   ------------------------------------------------  Thea Silversmith, MD  This document serves as a record of services personally performed by Thea Silversmith, MD. It was created on her behalf by Derek Mound, a trained medical scribe. The creation of this record is based on the scribe's personal observations and the provider's statements to them. This document has been checked and approved by the attending provider.

## 2015-02-02 ENCOUNTER — Other Ambulatory Visit: Payer: Self-pay | Admitting: Oncology

## 2015-02-03 ENCOUNTER — Encounter: Payer: Self-pay | Admitting: *Deleted

## 2015-02-03 NOTE — Progress Notes (Signed)
[place] Psychosocial Distress Screening Clinical Social Work  Clinical Social Work was referred by radiation oncologist and distress screening protocol.  The patient scored a 7 on the Psychosocial Distress Thermometer which indicates moderate distress. Clinical Social Worker met with patient at [facility] to assess for distress and other psychosocial needs.  Patient did not express any urgent needs or concerns at this time.  CSW offered support and reviewed support services available at [facility].  CSW provided contact information and encouraged patient to call with needs or concerns.       ONCBCN DISTRESS SCREENING 01/28/2015  Screening Type Initial Screening  Distress experienced in past week (1-10) 7  Practical problem type Childcare  Spiritual/Religous concerns type Relating to God  Physical Problem type Nausea/vomiting;Pain;Sleep/insomnia;Loss of appetitie;Constipation/diarrhea;Changes in urination;Tingling hands/feet   Johnnye Lana, MSW, LCSW, OSW-C Clinical Social Worker [facility] [phone]

## 2015-02-05 ENCOUNTER — Ambulatory Visit (HOSPITAL_BASED_OUTPATIENT_CLINIC_OR_DEPARTMENT_OTHER): Payer: Self-pay | Admitting: Nurse Practitioner

## 2015-02-05 ENCOUNTER — Encounter: Payer: Self-pay | Admitting: Nurse Practitioner

## 2015-02-05 ENCOUNTER — Other Ambulatory Visit: Payer: Self-pay | Admitting: *Deleted

## 2015-02-05 ENCOUNTER — Other Ambulatory Visit (HOSPITAL_BASED_OUTPATIENT_CLINIC_OR_DEPARTMENT_OTHER): Payer: Self-pay

## 2015-02-05 ENCOUNTER — Ambulatory Visit (HOSPITAL_BASED_OUTPATIENT_CLINIC_OR_DEPARTMENT_OTHER): Payer: Self-pay

## 2015-02-05 ENCOUNTER — Other Ambulatory Visit: Payer: Self-pay | Admitting: Oncology

## 2015-02-05 ENCOUNTER — Telehealth: Payer: Self-pay | Admitting: Oncology

## 2015-02-05 VITALS — BP 128/63 | HR 86 | Temp 98.8°F | Resp 18 | Wt 157.1 lb

## 2015-02-05 DIAGNOSIS — N183 Chronic kidney disease, stage 3 (moderate): Secondary | ICD-10-CM

## 2015-02-05 DIAGNOSIS — Z5112 Encounter for antineoplastic immunotherapy: Secondary | ICD-10-CM

## 2015-02-05 DIAGNOSIS — R110 Nausea: Secondary | ICD-10-CM

## 2015-02-05 DIAGNOSIS — M899 Disorder of bone, unspecified: Secondary | ICD-10-CM

## 2015-02-05 DIAGNOSIS — C9002 Multiple myeloma in relapse: Secondary | ICD-10-CM

## 2015-02-05 DIAGNOSIS — C9000 Multiple myeloma not having achieved remission: Secondary | ICD-10-CM

## 2015-02-05 DIAGNOSIS — M898X9 Other specified disorders of bone, unspecified site: Secondary | ICD-10-CM

## 2015-02-05 LAB — CBC WITH DIFFERENTIAL/PLATELET
BASO%: 0 % (ref 0.0–2.0)
Basophils Absolute: 0 10*3/uL (ref 0.0–0.1)
EOS%: 1 % (ref 0.0–7.0)
Eosinophils Absolute: 0.1 10*3/uL (ref 0.0–0.5)
HEMATOCRIT: 25.8 % — AB (ref 38.4–49.9)
HGB: 8.3 g/dL — ABNORMAL LOW (ref 13.0–17.1)
LYMPH#: 1.8 10*3/uL (ref 0.9–3.3)
LYMPH%: 21.6 % (ref 14.0–49.0)
MCH: 31.4 pg (ref 27.2–33.4)
MCHC: 32.2 g/dL (ref 32.0–36.0)
MCV: 97.7 fL (ref 79.3–98.0)
MONO#: 0.5 10*3/uL (ref 0.1–0.9)
MONO%: 5.9 % (ref 0.0–14.0)
NEUT%: 71.5 % (ref 39.0–75.0)
NEUTROS ABS: 6 10*3/uL (ref 1.5–6.5)
Platelets: 342 10*3/uL (ref 140–400)
RBC: 2.64 10*6/uL — ABNORMAL LOW (ref 4.20–5.82)
RDW: 16.2 % — AB (ref 11.0–14.6)
WBC: 8.3 10*3/uL (ref 4.0–10.3)

## 2015-02-05 LAB — COMPREHENSIVE METABOLIC PANEL
ALT: 28 U/L (ref 0–55)
AST: 9 U/L (ref 5–34)
Albumin: 1.9 g/dL — ABNORMAL LOW (ref 3.5–5.0)
Alkaline Phosphatase: 111 U/L (ref 40–150)
Anion Gap: 9 mEq/L (ref 3–11)
BUN: 29.6 mg/dL — AB (ref 7.0–26.0)
CALCIUM: 7 mg/dL — AB (ref 8.4–10.4)
CHLORIDE: 113 meq/L — AB (ref 98–109)
CO2: 20 mEq/L — ABNORMAL LOW (ref 22–29)
Creatinine: 1.3 mg/dL (ref 0.7–1.3)
EGFR: 67 mL/min/{1.73_m2} — ABNORMAL LOW (ref >90)
Glucose: 123 mg/dl (ref 70–140)
Potassium: 3.8 mEq/L (ref 3.5–5.1)
Sodium: 142 mEq/L (ref 136–145)
TOTAL PROTEIN: 5 g/dL — AB (ref 6.4–8.3)
Total Bilirubin: <0.3 mg/dL (ref 0.20–1.20)

## 2015-02-05 MED ORDER — SODIUM CHLORIDE 0.9 % IV SOLN
Freq: Once | INTRAVENOUS | Status: AC
  Administered 2015-02-05: 17:00:00 via INTRAVENOUS
  Filled 2015-02-05: qty 4

## 2015-02-05 MED ORDER — BORTEZOMIB CHEMO SQ INJECTION 3.5 MG (2.5MG/ML)
1.3000 mg/m2 | Freq: Once | INTRAMUSCULAR | Status: AC
  Administered 2015-02-05: 2.25 mg via SUBCUTANEOUS
  Filled 2015-02-05: qty 2.25

## 2015-02-05 MED ORDER — SODIUM CHLORIDE 0.9 % IV SOLN
Freq: Once | INTRAVENOUS | Status: AC
  Administered 2015-02-05: 17:00:00 via INTRAVENOUS

## 2015-02-05 MED ORDER — DEXAMETHASONE 4 MG PO TABS: ORAL_TABLET | ORAL | Status: DC

## 2015-02-05 MED ORDER — TRAMADOL HCL 50 MG PO TABS: 50.0000 mg | ORAL_TABLET | Freq: Four times a day (QID) | ORAL | Status: DC | PRN

## 2015-02-05 MED ORDER — ZOLEDRONIC ACID 4 MG/100ML IV SOLN
4.0000 mg | Freq: Once | INTRAVENOUS | Status: DC
  Filled 2015-02-05: qty 100

## 2015-02-05 MED ORDER — SULFAMETHOXAZOLE-TRIMETHOPRIM 800-160 MG PO TABS: 1.0000 | ORAL_TABLET | Freq: Two times a day (BID) | ORAL | Status: DC

## 2015-02-05 MED ORDER — BORTEZOMIB CHEMO IV INJECTION 3.5 MG: 2.2500 mg | Freq: Once | INTRAMUSCULAR | Status: DC

## 2015-02-05 MED ORDER — SODIUM CHLORIDE 0.9 % IV SOLN
500.0000 mg/m2 | Freq: Once | INTRAVENOUS | Status: DC
  Filled 2015-02-05: qty 44

## 2015-02-05 MED FILL — DEXAMETHASONE 4 MG TABLET: 4 | 35 days supply | Qty: 50 | Fill #0

## 2015-02-05 MED FILL — traMADol HCL 50 MG TABS: 50 | 15 days supply | Qty: 60 | Fill #0

## 2015-02-05 MED FILL — SULFAMETHOXAZOLE/TMP DS TAB: 800-160 | 30 days supply | Qty: 60 | Fill #0

## 2015-02-05 NOTE — Progress Notes (Signed)
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 The pain was worse with coughing. Exam was unremarkable, and he was treated with Mobic. No labs were obtained. On 01/30/2014 he presented to the emergency room at [facility] with similar complaints and also reported a 10 pound weight loss over the past month. A chest x-ray was significant only for mild atelectasis, but ACT NGO of the chest 01/30/2014, while it showed no pulmonary emboli, showed an osteolytic lesion destroying the manubrium of the sternum. There was extra osseous extension into the anterior mediastinum.  The patient was admitted and found to have a creatinine of 1.64 with a GFR of 51. Bilateral renal ultrasound showed no hydronephrosis or masses, and normal size and cortical thickness of both kidneys, though there was mild diffuse echogenicity. Urine total protein was 145 mg/dL and immunofixation showed a monoclonal IgG kappa protein as well as monoclonal free kappa light chains. On 01/31/2014 serum protein electrophoresis showed an M spike of 2.22 g/dL with a total protein of 8.3 and albumin 3.4. Kappa lambda light chains from the serum obtained 02/04/2014 showed 10.50 mg/dL on the, 1.74 in the lambda, with an elevated ratio at 6.03.  Beta-2 microglobulin was 7.36. LDH was normal at 184 and HIV antibody was nonreactive.  Biopsy of the manubrial mass 02/03/2014 showed extensive infiltration of the bone by sheets of atypical plasma cells, positive for CD138, kappa restricted (SZA 16-23). Right iliac bone marrow biopsy [name] 05/21/2014 (FZB 16-1) showed an average of 12% plasma cells with numerous aggregates and sheets of atypical plasma cells, which were kappa restricted. Cytogenetic analysis is pending  The patient's subsequent history is as detailed below  INTERVAL HISTORY: Charles [name] returns today for follow-up of his multiple myeloma, accompanied by his wife. In the interval history it was determined that his multiple myeloma has relapsed, and he will begin treatment with dexamethasone 46m every Tuesday and Wednesday; bortezomib sQ weekly, cyclophosphamide 500 mg/m2 every 3 weeks.  REVIEW OF SYSTEMS: Charles [name] complains of chills, but no fevers. He has right flank and hip pain. He visited with radiation oncology, and the plan is to hold off on therapy to see if restarting treatments here will help. He has some numbness to his hands and feel that come and go. His bowels are good. He still has discomfort with urination and occasional hematuria, but has already been evaluated by Dr. MVikki Ports This is said to be prostatitis and he has completed his course of antibiotics. He is nauseous on occasion, managed with zoftran. He has headaches, but does not take anything for them. His appetite and energy level are fair. A detailed review of systems is otherwise stable.   PAST MEDICAL HISTORY: Past Medical History  Diagnosis Date  . IgG myeloma ([facility]   . CKD (chronic kidney disease), stage III   . Hypercholesterolemia   .  Lytic bone lesions on xray     PAST SURGICAL HISTORY: No past surgical history on file.  FAMILY HISTORY Family History  Problem Relation Age of Onset  . Diabetes Father    The patient's parents are still  living, in their late 49's. The patient has 2 brothers, 3 sisters. There is noc cancer in the fmaily to his knowledge.  SOCIAL HISTORY:  Originally from [facility], moved here 19 years ago. He works in Nurse, adult. Married (wife's name is [name]), lives in [place]. He has 5 children ages 81, 43, 32, 51 and 38 months [name], [name], [name] and [name]) in good health. Best number to call him is [phone]    ADVANCED DIRECTIVES: not  In place   HEALTH MAINTENANCE: Social History  Substance Use Topics  . Smoking status: Never Smoker   . Smokeless tobacco: Never Used  . Alcohol Use: No     Colonoscopy:  PSA:  Bone density:  Lipid panel:  Allergies  Allergen Reactions  . Ciprofloxacin Rash  . Mobic [Meloxicam] Rash    Current Outpatient Prescriptions  Medication Sig Dispense Refill  . acyclovir (ZOVIRAX) 200 MG capsule Take 2 capsules (400 mg total) by mouth daily. 30 capsule 3  . ondansetron (ZOFRAN) 8 MG tablet Take 1 tablet (8 mg total) by mouth every 8 (eight) hours as needed for nausea or vomiting. 20 tablet 1  . pantoprazole (PROTONIX) 20 MG tablet Take 1 tablet (20 mg total) by mouth 2 (two) times daily. 60 tablet 3  . dexamethasone (DECADRON) 4 MG tablet Tome 5 pastillas al desayuno cada martes y miercoles (take 5 tablets with breakfast every Tues and Wed) 50 tablet 12  . diphenhydrAMINE (SOMINEX) 25 MG tablet Take 25 mg by mouth at bedtime as needed for allergies. Reported on 02/05/2015    . erythromycin ophthalmic ointment Place 1 application into the right eye 4 (four) times daily. (Patient not taking: Reported on 12/28/2014) 3.5 g 1  . fluconazole (DIFLUCAN) 100 MG tablet Take 1 tablet (100 mg total) by mouth daily. (Patient not taking: Reported on 01/28/2015) 10 tablet 0  . sulfamethoxazole-trimethoprim (BACTRIM DS,SEPTRA DS) 800-160 MG tablet Take 1 tablet by mouth 2 (two) times daily. 60 tablet 1  . traMADol (ULTRAM) 50 MG tablet Take  1 tablet (50 mg total) by mouth every 6 (six) hours as needed. 60 tablet 0   No current facility-administered medications for this visit.   Facility-Administered Medications Ordered in Other Visits  Medication Dose Route Frequency Provider Last Rate Last Dose  . 0.9 %  sodium chloride infusion   Intravenous Once Laurie Panda, NP      . bortezomib IV (VELCADE) chemo injection 2.3 mg  2.3 mg Intravenous Once [name], MD      . cyclophosphamide (CYTOXAN) 880 mg in sodium chloride 0.9 % 250 mL chemo infusion  500 mg/m2 (Treatment Plan Actual) Intravenous Once [name], MD      . ondansetron [facility]) 8 mg in sodium chloride 0.9 % 50 mL IVPB   Intravenous Once [name], MD      . Zoledronic Acid (ZOMETA) 4 mg IVPB  4 mg Intravenous Once [name], MD        OBJECTIVE: [name] man in no acute distress Filed Vitals:   02/05/15 1526  BP: 128/63  Pulse: 86  Temp: 98.8 F (37.1 C)  Resp: 18     Body mass index is 27.84 kg/(m^2).  ECOG FS:0 - Asymptomatic   Sclerae unicteric, pupils round and equal Oropharynx clear and moist-- no thrush or other lesions No cervical or supraclavicular adenopathy Lungs no rales or rhonchi Heart regular rate and rhythm Abd soft, nontender, positive bowel sounds MSK no focal spinal tenderness, no joint edema Neuro: nonfocal, well oriented, appropriate affect Skin: Peeling rash over the dorsum of both feet, without erythema or swelling.     LAB RESULTS: A restricted band consistent with monoclonal protein is present.The monoclonal protein peak accounts for 0.2 g/dL of the total0.9 g/dL of protein in the gamma region. Results are consistent with SPE performed on 08/25/2014  Results for KHALON, CANSLER (MRN [id number]) as of 10/15/2014 08:53  Ref. Range 06/23/2014 14:47 07/25/2014 15:50 08/08/2014 14:56 08/22/2014 15:36 09/19/2014 15:09  Kappa free light chain Latest Ref Range: 0.33-1.94 mg/dL 2.09 (H) 3.17 (H)  4.94  (H) 3.44 (H)  Lambda Free Lght Chn Latest Ref Range: 0.57-2.63 mg/dL 1.67 1.94  3.03 (H) 2.24  Kappa:Lambda Ratio Latest Ref Range: 0.26-1.65  1.25 1.63  1.63 1.54    CMP     Component Value Date/Time   NA 142 02/05/2015 1516   NA 144 01/27/2015 0605   K 3.8 02/05/2015 1516   K 4.8 01/27/2015 0605   CL 117* 01/27/2015 0605   CO2 20* 02/05/2015 1516   CO2 18* 01/27/2015 0605   GLUCOSE 123 02/05/2015 1516   GLUCOSE 116* 01/27/2015 0605   BUN 29.6* 02/05/2015 1516   BUN 64* 01/27/2015 0605   CREATININE 1.3 02/05/2015 1516   CREATININE 3.03* 01/27/2015 0605   CALCIUM 7.0* 02/05/2015 1516   CALCIUM 6.3* 01/27/2015 0605   PROT 5.0* 02/05/2015 1516   PROT 4.8* 01/25/2015 0740   ALBUMIN 1.9* 02/05/2015 1516   ALBUMIN 1.6* 01/27/2015 0605   AST 9 02/05/2015 1516   AST 18 01/25/2015 0740   ALT 28 02/05/2015 1516   ALT 32 01/25/2015 0740   ALKPHOS 111 02/05/2015 1516   ALKPHOS 107 01/25/2015 0740   BILITOT <0.30 02/05/2015 1516   BILITOT 0.1* 01/25/2015 0740   GFRNONAA 24* 01/27/2015 0605   GFRAA 28* 01/27/2015 0605    INo results found for: SPEP, UPEP  Lab Results  Component Value Date   WBC 8.3 02/05/2015   NEUTROABS 6.0 02/05/2015   HGB 8.3* 02/05/2015   HCT 25.8* 02/05/2015   MCV 97.7 02/05/2015   PLT 342 02/05/2015      Chemistry      Component Value Date/Time   NA 142 02/05/2015 1516   NA 144 01/27/2015 0605   K 3.8 02/05/2015 1516   K 4.8 01/27/2015 0605   CL 117* 01/27/2015 0605   CO2 20* 02/05/2015 1516   CO2 18* 01/27/2015 0605   BUN 29.6* 02/05/2015 1516   BUN 64* 01/27/2015 0605   CREATININE 1.3 02/05/2015 1516   CREATININE 3.03* 01/27/2015 0605      Component Value Date/Time   CALCIUM 7.0* 02/05/2015 1516   CALCIUM 6.3* 01/27/2015 0605   ALKPHOS 111 02/05/2015 1516   ALKPHOS 107 01/25/2015 0740   AST 9 02/05/2015 1516   AST 18 01/25/2015 0740   ALT 28 02/05/2015 1516   ALT 32 01/25/2015 0740   BILITOT <0.30 02/05/2015 1516   BILITOT 0.1*  01/25/2015 0740       No results found for: LABCA2  No components found for: PQDIY641  No results for input(s): INR in the last 168 hours.  Urinalysis    Component Value Date/Time  COLORURINE YELLOW 01/26/2015 0742   APPEARANCEUR CLOUDY* 01/26/2015 0742   LABSPEC 1.014 01/26/2015 0742   LABSPEC 1.030 01/22/2015 1018   PHURINE 5.0 01/26/2015 0742   PHURINE 5.0 01/22/2015 [place] 01/26/2015 0742   GLUCOSEU Negative 01/22/2015 1018   HGBUR LARGE* 01/26/2015 0742   HGBUR Large 01/22/2015 1018   BILIRUBINUR NEGATIVE 01/26/2015 0742   BILIRUBINUR Negative 01/22/2015 [place] 01/26/2015 0742   KETONESUR Negative 01/22/2015 1018   PROTEINUR 30* 01/26/2015 0742   PROTEINUR 2000 01/22/2015 1018   UROBILINOGEN 0.2 01/22/2015 1018   UROBILINOGEN 0.2 01/31/2014 1050   NITRITE NEGATIVE 01/26/2015 0742   NITRITE Negative 01/22/2015 1018   LEUKOCYTESUR NEGATIVE 01/26/2015 0742   LEUKOCYTESUR Color Interference due to blood in urine 01/22/2015 1018    STUDIES: Dg Ribs Unilateral W/chest Right  01/20/2015  CLINICAL DATA:  Multiple myeloma with right-sided chest pain EXAM: RIGHT RIBS AND CHEST - 3+ VIEW COMPARISON:  CT chest 02/03/2014. Chest CT 01/30/2014. Chest x-ray 01/05/2014 FINDINGS: Heart size within normal limits. Negative for heart failure. Lungs are clear without infiltrate or effusion Extrapleural soft tissue thickening in the right lung apex. This has been present on prior studies and may be due to scarring. There is a chronic healed fracture of the right anterior second rib which was noted on prior studies. The right second rib also shows abnormal infiltrative low density on CT suggestive of myeloma. No definite bony destruction is seen on the current study. Chronic fracture of the right anterior fifth rib unchanged from prior studies. Negative for acute fracture. IMPRESSION: No acute cardiopulmonary disease. Chronic healed fractures right second  and fifth ribs anteriorly. Right apical pleural density is similar prior studies and may be due to benign pleural thickening. Based on the prior CT there could be myeloma in the right second rib however no bony destruction is identified on the current study. Electronically Signed   By: Franchot Gallo M.D.   On: 01/20/2015 15:27   US Renal  01/22/2015  CLINICAL DATA:  Renal failure.  Chronic kidney disease stage 3. EXAM: RENAL / URINARY TRACT ULTRASOUND COMPLETE COMPARISON:  01/01/2015 FINDINGS: Right Kidney: Length: 11.1. Increased parenchymal echogenicity. No mass or hydronephrosis visualized. Left Kidney: Length: 12.2 cm. Increased parenchymal echogenicity. There is a cyst arising from the upper pole measuring 1.1 x 1.2 x 0.9 cm peer no mass or hydronephrosis. Bladder: Appears normal for degree of bladder distention. IMPRESSION: 1. No acute findings 2. Bilateral echogenic kidneys compatible with chronic medical renal disease. 3. Left kidney cyst. Electronically Signed   By: Kerby Moors M.D.   On: 01/22/2015 14:23    ASSESSMENT: 40 y.o. 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;        I: CyBorD started January 2016  (a) dexamethasone 20 mg/d every TU/WED started 02/04/2014  (b) bortezomib sQ days 1,4,8,11 of every 21 day cycle started 02/10/2014  (c) cyclophosphamide 300 mg/M2 IV weekly started 02/13/2014       2: Lenalidomide 18m daily started 07/26/2014  (2) chronic kidney disease stage III  (3) hypercalcemia: zolendronate started  02/10/2014-- repeat  every 4 weeks initially, then every 12 weeks  (4) ID prophylaxis:  (a) influenza and Pneumovax [PV13] vaccines 02/07/2014  (b) acyclovir started 02/07/2014  (c) HIV negative 01/31/2014  (d) Pneumovax P23 too be given after March 2016  (5). The patient is not a transplant candidate for financial reasons  (6) bilateral foot rash: Started on Septra DS and Diflucan 09/03/2014, to be continued for 10 days.  (7) Multiple Myeloma relapse in December 2016.   (a) starting 02/05/15: dexamethasone 76m every Tuesday and Wednesday; bortezomib sQ weekly, cyclophosphamide 500 mg/m2 every 3 weeks  (b) Septra DS twice daily  (c) acyclovir 4060mdaily    PLAN: Sara is stable today. The labs were reviewed in detail and were stable for treatment. His creatinine is 1.3. He will proceed with his first cycle of bortezomib and cyclophosphamide as planned. I have sent in a prescription for dexamethasone 2030mo start twice weekly. He will also restart septra DS and continue taking the acyclovir prophylactically. I discussed every drug in detail and both the patient and his wife are aware of the dosing schedule.  We have reminded him that tramadol is available for his pain.  Calven will return in 1 week for his next dose of bortezomib and a follow up visit. He understands and agree with this plan. He knows the goal of treatment in his case is control. He has been encouraged to call with any issues that might arise before his next visit here.  HeaLaurie PandaP   02/05/2015 4:35 PM

## 2015-02-05 NOTE — Telephone Encounter (Signed)
Appointments made and avs printed for patient °

## 2015-02-05 NOTE — Patient Instructions (Addendum)
[place] Discharge Instructions for Patients Receiving Chemotherapy  Today you received the following chemotherapy agents   and Velcade.Zoledronic Acid injection (Hypercalcemia, Oncology) What is this medicine? ZOLEDRONIC ACID (ZOE le dron ik AS id) lowers the amount of calcium loss from bone. It is used to treat too much calcium in your blood from cancer. It is also used to prevent complications of cancer that has spread to the bone. This medicine may be used for other purposes; ask your health care provider or pharmacist if you have questions. What should I tell my health care provider before I take this medicine? They need to know if you have any of these conditions: -aspirin-sensitive asthma -cancer, especially if you are receiving medicines used to treat cancer -dental disease or wear dentures -infection -kidney disease -receiving corticosteroids like dexamethasone or prednisone -an unusual or allergic reaction to zoledronic acid, other medicines, foods, dyes, or preservatives -pregnant or trying to get pregnant -breast-feeding How should I use this medicine? This medicine is for infusion into a vein. It is given by a health care professional in a hospital or clinic setting. Talk to your pediatrician regarding the use of this medicine in children. Special care may be needed. Overdosage: If you think you have taken too much of this medicine contact a poison control center or emergency room at once. NOTE: This medicine is only for you. Do not share this medicine with others. What if I miss a dose? It is important not to miss your dose. Call your doctor or health care professional if you are unable to keep an appointment. What may interact with this medicine? -certain antibiotics given by injection -NSAIDs, medicines for pain and inflammation, like ibuprofen or naproxen -some diuretics like bumetanide, furosemide -teriparatide -thalidomide This list may not describe  all possible interactions. Give your health care provider a list of all the medicines, herbs, non-prescription drugs, or dietary supplements you use. Also tell them if you smoke, drink alcohol, or use illegal drugs. Some items may interact with your medicine. What should I watch for while using this medicine? Visit your doctor or health care professional for regular checkups. It may be some time before you see the benefit from this medicine. Do not stop taking your medicine unless your doctor tells you to. Your doctor may order blood tests or other tests to see how you are doing. Women should inform their doctor if they wish to become pregnant or think they might be pregnant. There is a potential for serious side effects to an unborn child. Talk to your health care professional or pharmacist for more information. You should make sure that you get enough calcium and vitamin D while you are taking this medicine. Discuss the foods you eat and the vitamins you take with your health care professional. Some people who take this medicine have severe bone, joint, and/or muscle pain. This medicine may also increase your risk for jaw problems or a broken thigh bone. Tell your doctor right away if you have severe pain in your jaw, bones, joints, or muscles. Tell your doctor if you have any pain that does not go away or that gets worse. Tell your dentist and dental surgeon that you are taking this medicine. You should not have major dental surgery while on this medicine. See your dentist to have a dental exam and fix any dental problems before starting this medicine. Take good care of your teeth while on this medicine. Make sure you see your dentist  for regular follow-up appointments. What side effects may I notice from receiving this medicine? Side effects that you should report to your doctor or health care professional as soon as possible: -allergic reactions like skin rash, itching or hives, swelling of the face,  lips, or tongue -anxiety, confusion, or depression -breathing problems -changes in vision -eye pain -feeling faint or lightheaded, falls -jaw pain, especially after dental work -mouth sores -muscle cramps, stiffness, or weakness -redness, blistering, peeling or loosening of the skin, including inside the mouth -trouble passing urine or change in the amount of urine Side effects that usually do not require medical attention (report to your doctor or health care professional if they continue or are bothersome): -bone, joint, or muscle pain -constipation -diarrhea -fever -hair loss -irritation at site where injected -loss of appetite -nausea, vomiting -stomach upset -trouble sleeping -trouble swallowing -weak or tired This list may not describe all possible side effects. Call your doctor for medical advice about side effects. You may report side effects to FDA at 1-800-FDA-1088. Where should I keep my medicine? This drug is given in a hospital or clinic and will not be stored at home. NOTE: This sheet is a summary. It may not cover all possible information. If you have questions about this medicine, talk to your doctor, pharmacist, or health care provider.    2016, Elsevier/Gold Standard. (2013-06-15 14:19:39)   To help prevent nausea and vomiting after your treatment, we encourage you to take your nausea medication as directed.   If you develop nausea and vomiting that is not controlled by your nausea medication, call the clinic.   BELOW ARE SYMPTOMS THAT SHOULD BE REPORTED IMMEDIATELY:  *FEVER GREATER THAN 100.5 F  *CHILLS WITH OR WITHOUT FEVER  NAUSEA AND VOMITING THAT IS NOT CONTROLLED WITH YOUR NAUSEA MEDICATION  *UNUSUAL SHORTNESS OF BREATH  *UNUSUAL BRUISING OR BLEEDING  TENDERNESS IN MOUTH AND THROAT WITH OR WITHOUT PRESENCE OF ULCERS  *URINARY PROBLEMS  *BOWEL PROBLEMS  UNUSUAL RASH Items with * indicate a potential emergency and should be followed up as  soon as possible.  Feel free to call the clinic you have any questions or concerns. The clinic phone number is [phone].  Please show the [facility] at check-in to the Emergency Department and triage nurse.

## 2015-02-05 NOTE — Progress Notes (Deleted)
SYMPTOM MANAGEMENT CLINIC   HPI: Charles [name] 40 y.o. male diagnosed with multiple myeloma; with bone metastasis.  Currently undergoing Revlimid oral therapy and Zometa infusions.  Patient was seen by urologist, Dr. Vikki Ports on 01/09/2015; and diagnosed with apparent prostatitis.  Patient was prescribed Cipro and Mobic.  Patient states that he was instructed to take the Cipro antibiotics for a total of 30 days.  Patient developed a rash after taking the antibiotics for approximately one week.  He states the rash is only occasionally pruritic.  Exam reveals rash/hives to entire anterior neck with some radiation of the rash to the areas behind his ears.  He also has the same rash to his bilateral forearms as well.  Patient has no other allergic-type symptoms whatsoever.  Patient is also reporting a new onset of right lateral rib pain for the past several days.  He denies any chest pain, chest pressure, shortness breath, or pain with inspiration.  He denies any recent fevers or chills.   Urinary Tract Infection   Rash  Abdominal Pain    Review of Systems  Gastrointestinal: Positive for abdominal pain.  Skin: Positive for rash.    Past Medical History  Diagnosis Date  . IgG myeloma ([place])   . CKD (chronic kidney disease), stage III   . Hypercholesterolemia   . Lytic bone lesions on xray     No past surgical history on file.  has Lytic bone lesions on xray; Multiple myeloma (HCC); CKD (chronic kidney disease) stage 3, GFR 30-59 ml/min; Hordeolum externum of right eye; Prostatitis; Hypoalbuminemia due to protein-calorie malnutrition ([place]); Dehydration; Rash; Cancer associated pain; Nausea with vomiting; Antineoplastic chemotherapy induced anemia; ARF (acute renal failure) (HCC); and AKI (acute kidney injury) ([place]) on his problem list.    is allergic to ciprofloxacin and mobic.    Medication List       This list is accurate as of: 02/05/15  4:33 PM.  Always use your  most recent med list.               acyclovir 200 MG capsule  Commonly known as:  ZOVIRAX  Take 2 capsules (400 mg total) by mouth daily.     dexamethasone 4 MG tablet  Commonly known as:  DECADRON  Tome 5 pastillas al desayuno cada martes y miercoles (take 5 tablets with breakfast every Tues and Wed)     diphenhydrAMINE 25 MG tablet  Commonly known as:  SOMINEX  Take 25 mg by mouth at bedtime as needed for allergies. Reported on 02/05/2015     erythromycin ophthalmic ointment  Place 1 application into the right eye 4 (four) times daily.     fluconazole 100 MG tablet  Commonly known as:  DIFLUCAN  Take 1 tablet (100 mg total) by mouth daily.     ondansetron 8 MG tablet  Commonly known as:  ZOFRAN  Take 1 tablet (8 mg total) by mouth every 8 (eight) hours as needed for nausea or vomiting.     pantoprazole 20 MG tablet  Commonly known as:  PROTONIX  Take 1 tablet (20 mg total) by mouth 2 (two) times daily.     sulfamethoxazole-trimethoprim 800-160 MG tablet  Commonly known as:  BACTRIM DS,SEPTRA DS  Take 1 tablet by mouth 2 (two) times daily.     traMADol 50 MG tablet  Commonly known as:  ULTRAM  Take 1 tablet (50 mg total) by mouth every 6 (six) hours as needed.  PHYSICAL EXAMINATION  Oncology Vitals 02/05/2015 01/28/2015  Height - 160 cm  Weight 71.26 kg 74.299 kg  Weight (lbs) 157 lbs 2 oz 163 lbs 13 oz  BMI (kg/m2) - 29.02 kg/m2  Temp 98.8 97.6  Pulse 86 80  Resp 18 -  SpO2 100 100  BSA (m2) - 1.82 m2   BP Readings from Last 2 Encounters:  02/05/15 128/63  01/28/15 126/75    Physical Exam  Constitutional: He is oriented to person, place, and time. Vital signs are normal. He appears unhealthy.  HENT:  Head: Normocephalic and atraumatic.  Mouth/Throat: Oropharynx is clear and moist.  Eyes: Conjunctivae and EOM are normal. Pupils are equal, round, and reactive to light. Right eye exhibits no discharge. Left eye exhibits no discharge. No scleral  icterus.  Neck: Normal range of motion. Neck supple. No JVD present. No tracheal deviation present. No thyromegaly present.  Cardiovascular: Normal rate, regular rhythm, normal heart sounds and intact distal pulses.   Pulmonary/Chest: Effort normal and breath sounds normal. No respiratory distress. He has no wheezes. He has no rales. He exhibits tenderness.  Right lateral rib tenderness with any palpation.  No obvious injury or trauma to the site.  Abdominal: Soft. Bowel sounds are normal. He exhibits no distension and no mass. There is no tenderness. There is no rebound and no guarding.  Genitourinary:  No flank pain.  Musculoskeletal: Normal range of motion. He exhibits no edema or tenderness.  Lymphadenopathy:    He has no cervical adenopathy.  Neurological: He is alert and oriented to person, place, and time. Gait normal.  Skin: Skin is warm and dry. Rash noted. No erythema. There is pallor.  Patient has rash/hives to his anterior neck region; some radiation of the rash to the areas directly behind his years as well.  Also has the same rash to his bilateral forearms.  Psychiatric: Affect normal.  Nursing note and vitals reviewed.  you should follow along with a will  LABORATORY DATA:. Appointment on 02/05/2015  Component Date Value Ref Range Status  . WBC 02/05/2015 8.3  4.0 - 10.3 10e3/uL Final  . NEUT# 02/05/2015 6.0  1.5 - 6.5 10e3/uL Final  . HGB 02/05/2015 8.3* 13.0 - 17.1 g/dL Final  . HCT 02/05/2015 25.8* 38.4 - 49.9 % Final  . Platelets 02/05/2015 342  140 - 400 10e3/uL Final  . MCV 02/05/2015 97.7  79.3 - 98.0 fL Final  . MCH 02/05/2015 31.4  27.2 - 33.4 pg Final  . MCHC 02/05/2015 32.2  32.0 - 36.0 g/dL Final  . RBC 02/05/2015 2.64* 4.20 - 5.82 10e6/uL Final  . RDW 02/05/2015 16.2* 11.0 - 14.6 % Final  . lymph# 02/05/2015 1.8  0.9 - 3.3 10e3/uL Final  . MONO# 02/05/2015 0.5  0.1 - 0.9 10e3/uL Final  . Eosinophils Absolute 02/05/2015 0.1  0.0 - 0.5 10e3/uL Final  .  Basophils Absolute 02/05/2015 0.0  0.0 - 0.1 10e3/uL Final  . NEUT% 02/05/2015 71.5  39.0 - 75.0 % Final  . LYMPH% 02/05/2015 21.6  14.0 - 49.0 % Final  . MONO% 02/05/2015 5.9  0.0 - 14.0 % Final  . EOS% 02/05/2015 1.0  0.0 - 7.0 % Final  . BASO% 02/05/2015 0.0  0.0 - 2.0 % Final  . Sodium 02/05/2015 142  136 - 145 mEq/L Final  . Potassium 02/05/2015 3.8  3.5 - 5.1 mEq/L Final  . Chloride 02/05/2015 113* 98 - 109 mEq/L Final  . CO2 02/05/2015 20* 22 - 29  mEq/L Final  . Glucose 02/05/2015 123  70 - 140 mg/dl Final   Glucose reference range is for nonfasting patients. Fasting glucose reference range is 70- 100.  Marland Kitchen BUN 02/05/2015 29.6* 7.0 - 26.0 mg/dL Final  . Creatinine 02/05/2015 1.3  0.7 - 1.3 mg/dL Final  . Total Bilirubin 02/05/2015 <0.30  0.20 - 1.20 mg/dL Final  . Alkaline Phosphatase 02/05/2015 111  40 - 150 U/L Final  . AST 02/05/2015 9  5 - 34 U/L Final  . ALT 02/05/2015 28  0 - 55 U/L Final  . Total Protein 02/05/2015 5.0* 6.4 - 8.3 g/dL Final  . Albumin 02/05/2015 1.9* 3.5 - 5.0 g/dL Final  . Calcium 02/05/2015 7.0* 8.4 - 10.4 mg/dL Final  . Anion Gap 02/05/2015 9  3 - 11 mEq/L Final  . EGFR 02/05/2015 67* >90 ml/min/1.73 m2 Final   eGFR is calculated using the CKD-EPI Creatinine Equation (2009)     RADIOGRAPHIC STUDIES: No results found.  ASSESSMENT/PLAN:    No problem-specific assessment & plan notes found for this encounter.    Patient stated understanding of all instructions; and was in agreement with this plan of care. The patient knows to call the clinic with any problems, questions or concerns.   Review/collaboration with Dr. Jana Hakim regarding all aspects of patient's visit today.   Total time spent with patient was 40 minutes;  with greater than 75 percent of that time spent in face to face counseling regarding patient's symptoms,  and coordination of care and follow up.  Disclaimer:This dictation was prepared with Dragon/digital dictation along with  [employer]. Any transcriptional errors that result from this process are unintentional.  Laurie Panda, NP 02/05/2015

## 2015-02-09 ENCOUNTER — Other Ambulatory Visit: Payer: Self-pay | Admitting: *Deleted

## 2015-02-12 ENCOUNTER — Other Ambulatory Visit (HOSPITAL_BASED_OUTPATIENT_CLINIC_OR_DEPARTMENT_OTHER): Payer: Self-pay

## 2015-02-12 ENCOUNTER — Ambulatory Visit (HOSPITAL_BASED_OUTPATIENT_CLINIC_OR_DEPARTMENT_OTHER): Payer: Self-pay

## 2015-02-12 ENCOUNTER — Telehealth: Payer: Self-pay | Admitting: *Deleted

## 2015-02-12 ENCOUNTER — Ambulatory Visit (HOSPITAL_BASED_OUTPATIENT_CLINIC_OR_DEPARTMENT_OTHER): Payer: Self-pay | Admitting: Nurse Practitioner

## 2015-02-12 ENCOUNTER — Ambulatory Visit: Payer: Self-pay

## 2015-02-12 ENCOUNTER — Telehealth: Payer: Self-pay | Admitting: Hematology and Oncology

## 2015-02-12 ENCOUNTER — Encounter: Payer: Self-pay | Admitting: Nurse Practitioner

## 2015-02-12 ENCOUNTER — Other Ambulatory Visit: Payer: Self-pay | Admitting: *Deleted

## 2015-02-12 VITALS — BP 122/61 | HR 85 | Temp 98.0°F | Resp 18 | Ht 63.0 in | Wt 151.6 lb

## 2015-02-12 DIAGNOSIS — Z5112 Encounter for antineoplastic immunotherapy: Secondary | ICD-10-CM

## 2015-02-12 DIAGNOSIS — C9002 Multiple myeloma in relapse: Secondary | ICD-10-CM

## 2015-02-12 DIAGNOSIS — N183 Chronic kidney disease, stage 3 (moderate): Secondary | ICD-10-CM

## 2015-02-12 DIAGNOSIS — D6481 Anemia due to antineoplastic chemotherapy: Secondary | ICD-10-CM

## 2015-02-12 DIAGNOSIS — C9000 Multiple myeloma not having achieved remission: Secondary | ICD-10-CM

## 2015-02-12 DIAGNOSIS — D649 Anemia, unspecified: Secondary | ICD-10-CM

## 2015-02-12 DIAGNOSIS — T451X5A Adverse effect of antineoplastic and immunosuppressive drugs, initial encounter: Secondary | ICD-10-CM

## 2015-02-12 DIAGNOSIS — M899 Disorder of bone, unspecified: Secondary | ICD-10-CM

## 2015-02-12 LAB — COMPREHENSIVE METABOLIC PANEL
ALT: 27 U/L (ref 0–55)
ANION GAP: 10 meq/L (ref 3–11)
AST: 16 U/L (ref 5–34)
Albumin: 2.5 g/dL — ABNORMAL LOW (ref 3.5–5.0)
Alkaline Phosphatase: 136 U/L (ref 40–150)
BUN: 40.1 mg/dL — ABNORMAL HIGH (ref 7.0–26.0)
CHLORIDE: 116 meq/L — AB (ref 98–109)
Calcium: 7.3 mg/dL — ABNORMAL LOW (ref 8.4–10.4)
Creatinine: 2.2 mg/dL — ABNORMAL HIGH (ref 0.7–1.3)
EGFR: 37 mL/min/{1.73_m2} — AB (ref >90)
Glucose: 112 mg/dl (ref 70–140)
POTASSIUM: 4.9 meq/L (ref 3.5–5.1)
Sodium: 140 mEq/L (ref 136–145)
Total Bilirubin: <0.3 mg/dL (ref 0.20–1.20)
Total Protein: 6.4 g/dL (ref 6.4–8.3)

## 2015-02-12 LAB — CBC WITH DIFFERENTIAL/PLATELET
BASO%: 0 % (ref 0.0–2.0)
Basophils Absolute: 0 10*3/uL (ref 0.0–0.1)
EOS%: 0 % (ref 0.0–7.0)
Eosinophils Absolute: 0 10*3/uL (ref 0.0–0.5)
HCT: 22.3 % — ABNORMAL LOW (ref 38.4–49.9)
HGB: 7.4 g/dL — ABNORMAL LOW (ref 13.0–17.1)
LYMPH%: 14.5 % (ref 14.0–49.0)
MCH: 31.8 pg (ref 27.2–33.4)
MCHC: 33.2 g/dL (ref 32.0–36.0)
MCV: 95.7 fL (ref 79.3–98.0)
MONO#: 0.9 10*3/uL (ref 0.1–0.9)
MONO%: 6.8 % (ref 0.0–14.0)
NEUT#: 10.3 10*3/uL — ABNORMAL HIGH (ref 1.5–6.5)
NEUT%: 78.7 % — AB (ref 39.0–75.0)
PLATELETS: 484 10*3/uL — AB (ref 140–400)
RBC: 2.33 10*6/uL — AB (ref 4.20–5.82)
RDW: 17.1 % — ABNORMAL HIGH (ref 11.0–14.6)
WBC: 13.2 10*3/uL — ABNORMAL HIGH (ref 4.0–10.3)
lymph#: 1.9 10*3/uL (ref 0.9–3.3)

## 2015-02-12 MED ORDER — ACYCLOVIR 200 MG PO CAPS: 400.0000 mg | ORAL_CAPSULE | Freq: Every day | ORAL | Status: DC

## 2015-02-12 MED ORDER — ONDANSETRON HCL 8 MG PO TABS: 8.0000 mg | ORAL_TABLET | Freq: Three times a day (TID) | ORAL | Status: DC | PRN

## 2015-02-12 MED ORDER — ONDANSETRON HCL 8 MG PO TABS
ORAL_TABLET | ORAL | Status: AC
  Filled 2015-02-12: qty 1

## 2015-02-12 MED ORDER — BORTEZOMIB CHEMO SQ INJECTION 3.5 MG (2.5MG/ML)
1.3000 mg/m2 | Freq: Once | INTRAMUSCULAR | Status: AC
  Administered 2015-02-12: 2.25 mg via SUBCUTANEOUS
  Filled 2015-02-12: qty 2.25

## 2015-02-12 MED ORDER — ONDANSETRON HCL 8 MG PO TABS
8.0000 mg | ORAL_TABLET | Freq: Once | ORAL | Status: AC
  Administered 2015-02-12: 8 mg via ORAL

## 2015-02-12 MED FILL — ACYCLOVIR 200 MG CAPSULE: 200 | 15 days supply | Qty: 30 | Fill #0

## 2015-02-12 MED FILL — ONDANSETRON HCL 8 MG TABLET: 8 | 6 days supply | Qty: 20 | Fill #0

## 2015-02-12 NOTE — Telephone Encounter (Signed)
Labs added for 1/13 and cancelled for 1/16 per pof and patient will get a new avs in chemo

## 2015-02-12 NOTE — Progress Notes (Signed)
Dr. Jana Hakim made aware of creatinine 2.2. OK to treat today. Pharmacy and [place]ie, Infusion RN made aware.

## 2015-02-12 NOTE — Progress Notes (Signed)
Spoke with [employer] LPN.  VO Gentry Fitz NP  Ok to treat with Hgb 7.4 today.  Patient will be transfused on Monday 02/15/15.

## 2015-02-12 NOTE — Telephone Encounter (Signed)
Per desk RN I have rescheduled appts for 1/16 and cancelled 1/13

## 2015-02-12 NOTE — Progress Notes (Signed)
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 The pain was worse with coughing. Exam was unremarkable, and he was treated with Mobic. No labs were obtained. On 01/30/2014 he presented to the emergency room at [name] with similar complaints and also reported a 10 pound weight loss over the past month. A chest x-ray was significant only for mild atelectasis, but ACT NGO of the chest 01/30/2014, while it showed no pulmonary emboli, showed an osteolytic lesion destroying the manubrium of the sternum. There was extra osseous extension into the anterior mediastinum.  The patient was admitted and found to have a creatinine of 1.64 with a GFR of 51. Bilateral renal ultrasound showed no hydronephrosis or masses, and normal size and cortical thickness of both kidneys, though there was mild diffuse echogenicity. Urine total protein was 145 mg/dL and immunofixation showed a monoclonal IgG kappa protein as well as monoclonal free kappa light chains. On 01/31/2014 serum protein electrophoresis showed an M spike of 2.22 g/dL with a total protein of 8.3 and albumin 3.4. Kappa lambda light chains from the serum obtained 02/04/2014 showed 10.50 mg/dL on the, 1.74 in the lambda, with an elevated ratio at 6.03. Beta-2  microglobulin was 7.36. LDH was normal at 184 and HIV antibody was nonreactive.  Biopsy of the manubrial mass 02/03/2014 showed extensive infiltration of the bone by sheets of atypical plasma cells, positive for CD138, kappa restricted (SZA 16-23). Right iliac bone marrow biopsy Charles [name] 05/21/2014 (FZB 16-1) showed an average of 12% plasma cells with numerous aggregates and sheets of atypical plasma cells, which were kappa restricted. Cytogenetic analysis is pending  The patient's subsequent history is as detailed below  INTERVAL HISTORY: Charles [name] returns today for follow-up of his multiple myeloma, aclone. Today he is due for his second cycle of weekly sQ bortezomib. Cyclophosphamide will be due again in 2 weeks, and of course he continues on dexamethasone 42m every Tuesday and Wednesday.   REVIEW OF SYSTEMS: Manly tolerated treatment well for the most part. He did rely on zofran for his waves of nausea, but denied vomiting. He is moving his bowels well. His pain to the right flank and hip have improved since he started using the tramadol. His discomfort with urination is no worse, with less hematuria. He has some numbness to his hands and feel that come and go. He denies fevers or chills. He is somewhat fatigued. His appetite is fair. A detailed review of systems is otherwise stable.  PAST MEDICAL HISTORY: Past Medical History  Diagnosis Date  . IgG myeloma ([place]   . CKD (chronic kidney disease), stage III   . Hypercholesterolemia   . Lytic bone lesions on xray     PAST SURGICAL HISTORY: No past surgical history on file.  FAMILY HISTORY Family History  Problem Relation Age of Onset  . Diabetes Father  The patient's parents are still living, in their late 50's. The patient has 2 brothers, 3 sisters. There is noc cancer in the fmaily to his knowledge.  SOCIAL HISTORY:  Originally from [facility], moved here 19 years ago. He works in Nurse, adult. Married  (wife's name is Charles [name]), lives in [place]. He has 5 children ages 46, 1, 34, 27 and 33 months Charles [name], Charles [name], Charles [name] and Charles [name]) in good health. Best number to call him is [phone]    ADVANCED DIRECTIVES: not  In place   HEALTH MAINTENANCE: Social History  Substance Use Topics  . Smoking status: Never Smoker   . Smokeless tobacco: Never Used  . Alcohol Use: No     Colonoscopy:  PSA:  Bone density:  Lipid panel:  Allergies  Allergen Reactions  . Ciprofloxacin Rash  . Mobic [Meloxicam] Rash    Current Outpatient Prescriptions  Medication Sig Dispense Refill  . dexamethasone (DECADRON) 4 MG tablet Tome 5 pastillas al desayuno cada martes y miercoles (take 5 tablets with breakfast every Tues and Wed) 50 tablet 12  . pantoprazole (PROTONIX) 20 MG tablet Take 1 tablet (20 mg total) by mouth 2 (two) times daily. 60 tablet 3  . sulfamethoxazole-trimethoprim (BACTRIM DS,SEPTRA DS) 800-160 MG tablet Take 1 tablet by mouth 2 (two) times daily. 60 tablet 1  . traMADol (ULTRAM) 50 MG tablet Take 1 tablet (50 mg total) by mouth every 6 (six) hours as needed. 60 tablet 0  . acyclovir (ZOVIRAX) 200 MG capsule Take 2 capsules (400 mg total) by mouth daily. 30 capsule 2  . diphenhydrAMINE (SOMINEX) 25 MG tablet Take 25 mg by mouth at bedtime as needed for allergies. Reported on 02/12/2015    . erythromycin ophthalmic ointment Place 1 application into the right eye 4 (four) times daily. (Patient not taking: Reported on 12/28/2014) 3.5 g 1  . fluconazole (DIFLUCAN) 100 MG tablet Take 1 tablet (100 mg total) by mouth daily. (Patient not taking: Reported on 01/28/2015) 10 tablet 0  . ondansetron (ZOFRAN) 8 MG tablet Take 1 tablet (8 mg total) by mouth every 8 (eight) hours as needed for nausea or vomiting. 20 tablet 1   No current facility-administered medications for this visit.    OBJECTIVE: Charles [name] man in no acute distress Filed Vitals:   02/12/15 1334  BP: 122/61    Pulse: 85  Temp: 98 F (36.7 C)  Resp: 18     Body mass index is 26.86 kg/(m^2).    ECOG FS:0 - Asymptomatic .  Skin: warm, dry  HEENT: sclerae anicteric, conjunctivae pink, oropharynx clear. No thrush or mucositis.  Lymph Nodes: No cervical or supraclavicular lymphadenopathy  Lungs: clear to auscultation bilaterally, no rales, wheezes, or rhonci  Heart: regular rate and rhythm  Abdomen: round, soft, non tender, positive bowel sounds  Musculoskeletal: No focal spinal tenderness, no peripheral edema  Neuro: non focal, well oriented, positive affect    LAB RESULTS: Results for ADAM, SANJUAN (MRN [id number]) as of 02/12/2015 16:54  Ref. Range 11/14/2014 15:27 12/12/2014 15:06 01/01/2015 12:03 01/20/2015 09:52  Kappa free light chain Latest Ref Range: 0.33-1.94 mg/dL 4.19 (H) 5.15 (H) 10.50 (H) 12.40 (H)  Lambda Free Lght Chn Latest Ref Range: 0.57-2.63 mg/dL 2.60 2.45 3.39 (H) 2.81 (H)  Kappa:Lambda Ratio Latest Ref Range: 0.26-1.65  1.61 2.10 (H) 3.10 (H) 4.41 (H)     CMP     Component Value Date/Time   NA 140 02/12/2015 1316   NA 144 01/27/2015 6503  K 4.9 02/12/2015 1316   K 4.8 01/27/2015 0605   CL 117* 01/27/2015 0605   CO2 15 Recollected to verify results* 02/12/2015 1316   CO2 18* 01/27/2015 0605   GLUCOSE 112 02/12/2015 1316   GLUCOSE 116* 01/27/2015 0605   BUN 40.1* 02/12/2015 1316   BUN 64* 01/27/2015 0605   CREATININE 2.2* 02/12/2015 1316   CREATININE 3.03* 01/27/2015 0605   CALCIUM 7.3* 02/12/2015 1316   CALCIUM 6.3* 01/27/2015 0605   PROT 6.4 02/12/2015 1316   PROT 4.8* 01/25/2015 0740   ALBUMIN 2.5* 02/12/2015 1316   ALBUMIN 1.6* 01/27/2015 0605   AST 16 02/12/2015 1316   AST 18 01/25/2015 0740   ALT 27 02/12/2015 1316   ALT 32 01/25/2015 0740   ALKPHOS 136 02/12/2015 1316   ALKPHOS 107 01/25/2015 0740   BILITOT <0.30 02/12/2015 1316   BILITOT 0.1* 01/25/2015 0740   GFRNONAA 24* 01/27/2015 0605   GFRAA 28* 01/27/2015 0605    INo results  found for: SPEP, UPEP  Lab Results  Component Value Date   WBC 13.2* 02/12/2015   NEUTROABS 10.3* 02/12/2015   HGB 7.4* 02/12/2015   HCT 22.3* 02/12/2015   MCV 95.7 02/12/2015   PLT 484* 02/12/2015      Chemistry      Component Value Date/Time   NA 140 02/12/2015 1316   NA 144 01/27/2015 0605   K 4.9 02/12/2015 1316   K 4.8 01/27/2015 0605   CL 117* 01/27/2015 0605   CO2 15 Recollected to verify results* 02/12/2015 1316   CO2 18* 01/27/2015 0605   BUN 40.1* 02/12/2015 1316   BUN 64* 01/27/2015 0605   CREATININE 2.2* 02/12/2015 1316   CREATININE 3.03* 01/27/2015 0605      Component Value Date/Time   CALCIUM 7.3* 02/12/2015 1316   CALCIUM 6.3* 01/27/2015 0605   ALKPHOS 136 02/12/2015 1316   ALKPHOS 107 01/25/2015 0740   AST 16 02/12/2015 1316   AST 18 01/25/2015 0740   ALT 27 02/12/2015 1316   ALT 32 01/25/2015 0740   BILITOT <0.30 02/12/2015 1316   BILITOT 0.1* 01/25/2015 0740       No results found for: LABCA2  No components found for: LABCA125  No results for input(s): INR in the last 168 hours.  Urinalysis    Component Value Date/Time   COLORURINE YELLOW 01/26/2015 0742   APPEARANCEUR CLOUDY* 01/26/2015 0742   LABSPEC 1.014 01/26/2015 0742   LABSPEC 1.030 01/22/2015 1018   PHURINE 5.0 01/26/2015 0742   PHURINE 5.0 01/22/2015 [place] 01/26/2015 0742   GLUCOSEU Negative 01/22/2015 1018   HGBUR LARGE* 01/26/2015 0742   HGBUR Large 01/22/2015 1018   BILIRUBINUR NEGATIVE 01/26/2015 0742   BILIRUBINUR Negative 01/22/2015 1018   KETONESUR NEGATIVE 01/26/2015 0742   KETONESUR Negative 01/22/2015 1018   PROTEINUR 30* 01/26/2015 0742   PROTEINUR 2000 01/22/2015 1018   UROBILINOGEN 0.2 01/22/2015 1018   UROBILINOGEN 0.2 01/31/2014 1050   NITRITE NEGATIVE 01/26/2015 0742   NITRITE Negative 01/22/2015 1018   LEUKOCYTESUR NEGATIVE 01/26/2015 0742   LEUKOCYTESUR Color Interference due to blood in urine 01/22/2015 1018    STUDIES: Dg Ribs  Unilateral W/chest Right  01/20/2015  CLINICAL DATA:  Multiple myeloma with right-sided chest pain EXAM: RIGHT RIBS AND CHEST - 3+ VIEW COMPARISON:  CT chest 02/03/2014. Chest CT 01/30/2014. Chest x-ray 01/05/2014 FINDINGS: Heart size within normal limits. Negative for heart failure. Lungs are clear without infiltrate or effusion Extrapleural soft tissue thickening in  the right lung apex. This has been present on prior studies and may be due to scarring. There is a chronic healed fracture of the right anterior second rib which was noted on prior studies. The right second rib also shows abnormal infiltrative low density on CT suggestive of myeloma. No definite bony destruction is seen on the current study. Chronic fracture of the right anterior fifth rib unchanged from prior studies. Negative for acute fracture. IMPRESSION: No acute cardiopulmonary disease. Chronic healed fractures right second and fifth ribs anteriorly. Right apical pleural density is similar prior studies and may be due to benign pleural thickening. Based on the prior CT there could be myeloma in the right second rib however no bony destruction is identified on the current study. Electronically Signed   By: Franchot Gallo M.D.   On: 01/20/2015 15:27   US Renal  01/22/2015  CLINICAL DATA:  Renal failure.  Chronic kidney disease stage 3. EXAM: RENAL / URINARY TRACT ULTRASOUND COMPLETE COMPARISON:  01/01/2015 FINDINGS: Right Kidney: Length: 11.1. Increased parenchymal echogenicity. No mass or hydronephrosis visualized. Left Kidney: Length: 12.2 cm. Increased parenchymal echogenicity. There is a cyst arising from the upper pole measuring 1.1 x 1.2 x 0.9 cm peer no mass or hydronephrosis. Bladder: Appears normal for degree of bladder distention. IMPRESSION: 1. No acute findings 2. Bilateral echogenic kidneys compatible with chronic medical renal disease. 3. Left kidney cyst. Electronically Signed   By: Kerby Moors M.D.   On: 01/22/2015 14:23     ASSESSMENT: 40 y.o. 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;        I: CyBorD started January 2016  (a) dexamethasone 20 mg/d every TU/WED started 02/04/2014  (b) bortezomib sQ days 1,4,8,11 of every 21 day cycle started 02/10/2014  (c) cyclophosphamide 300 mg/M2 IV weekly started 02/13/2014       2: Lenalidomide 29m daily started 07/26/2014  (2) chronic kidney disease stage III  (3) hypercalcemia: zolendronate started 02/10/2014-- repeat every 4 weeks initially, then every 12 weeks  (4) ID prophylaxis:  (a) influenza and Pneumovax [PV13] vaccines 02/07/2014  (b) acyclovir started 02/07/2014  (c) HIV negative 01/31/2014  (d) Pneumovax P23 too be given after March 2016  (5). The patient is not a transplant candidate for financial reasons  (6) bilateral foot rash: Started on Septra DS and Diflucan 09/03/2014, to be continued for 10 days.  (7) Multiple Myeloma relapse in December 2016.   (a) starting 02/05/15: dexamethasone 217mevery Tuesday and Wednesday; bortezomib sQ weekly, cyclophosphamide 500 mg/m2 every 3 weeks  (b) Septra DS twice daily  (c) acyclovir 40059maily    PLAN: Charles [name] is doing well so far since we have reinitiated bortezomib and cyclophosphamide. He will continue to use tramadol for pain, which has been effective and zofran for his nausea. The labs were reviewed in detail and his hgb is down to 7.4. I have made arrangements for him to receive 2 units of blood on Monday. We will continue to monitor his kidney function and  acidosis. He is ok to treat with a creatinine of 2.2 today.   Charles [name] will  return in 1 week for his next dose of bortezomib and a follow up visit with Dr. Jana Hakim. He understands and agree with this plan. He knows the goal of treatment in his case is control. He has been encouraged to call with any issues that might arise before his next visit here.  Charles Panda, NP   02/12/2015 4:50 PM

## 2015-02-13 LAB — KAPPA/LAMBDA LIGHT CHAINS
IG KAPPA FREE LIGHT CHAIN: 26.08 mg/L — AB (ref 3.30–19.40)
Ig Lambda Free Light Chain: 10.15 mg/L (ref 5.71–26.30)
KAPPA/LAMBDA FLC RATIO: 2.57 — AB (ref 0.26–1.65)

## 2015-02-13 LAB — BETA 2 MICROGLOBULIN, SERUM: BETA 2: 3.9 mg/L — AB (ref 0.6–2.4)

## 2015-02-16 ENCOUNTER — Other Ambulatory Visit (HOSPITAL_BASED_OUTPATIENT_CLINIC_OR_DEPARTMENT_OTHER): Payer: Self-pay

## 2015-02-16 ENCOUNTER — Ambulatory Visit (HOSPITAL_COMMUNITY)
Admission: RE | Admit: 2015-02-16 | Discharge: 2015-02-16 | Disposition: A | Discharge status: Home / Self Care | Payer: Self-pay | Source: Ambulatory Visit | Attending: Oncology | Admitting: Oncology

## 2015-02-16 ENCOUNTER — Other Ambulatory Visit: Payer: Self-pay | Admitting: Nurse Practitioner

## 2015-02-16 ENCOUNTER — Ambulatory Visit (HOSPITAL_BASED_OUTPATIENT_CLINIC_OR_DEPARTMENT_OTHER): Payer: Self-pay

## 2015-02-16 ENCOUNTER — Other Ambulatory Visit: Payer: Self-pay

## 2015-02-16 VITALS — BP 127/65 | HR 75 | Temp 98.3°F | Resp 18

## 2015-02-16 DIAGNOSIS — D6481 Anemia due to antineoplastic chemotherapy: Secondary | ICD-10-CM

## 2015-02-16 DIAGNOSIS — C9002 Multiple myeloma in relapse: Secondary | ICD-10-CM

## 2015-02-16 DIAGNOSIS — C649 Malignant neoplasm of unspecified kidney, except renal pelvis: Secondary | ICD-10-CM

## 2015-02-16 DIAGNOSIS — D649 Anemia, unspecified: Secondary | ICD-10-CM | POA: Insufficient documentation

## 2015-02-16 DIAGNOSIS — T451X5A Adverse effect of antineoplastic and immunosuppressive drugs, initial encounter: Principal | ICD-10-CM

## 2015-02-16 LAB — MULTIPLE MYELOMA PANEL, SERUM
ALBUMIN/GLOB SERPL: 0.7 (ref 0.7–1.7)
Albumin SerPl Elph-Mcnc: 2.3 g/dL — ABNORMAL LOW (ref 2.9–4.4)
Alpha 1: 0.4 g/dL (ref 0.0–0.4)
Alpha2 Glob SerPl Elph-Mcnc: 1.3 g/dL — ABNORMAL HIGH (ref 0.4–1.0)
B-Globulin SerPl Elph-Mcnc: 0.8 g/dL (ref 0.7–1.3)
GAMMA GLOB SERPL ELPH-MCNC: 0.7 g/dL (ref 0.4–1.8)
GLOBULIN, TOTAL: 3.3 g/dL (ref 2.2–3.9)
IGA/IMMUNOGLOBULIN A, SERUM: 82 mg/dL — AB (ref 90–386)
IGM (IMMUNOGLOBIN M), SRM: 43 mg/dL (ref 20–172)
IgG, Qn, Serum: 701 mg/dL (ref 700–1600)
M Protein SerPl Elph-Mcnc: 0.5 g/dL — ABNORMAL HIGH
Total Protein: 5.6 g/dL — ABNORMAL LOW (ref 6.0–8.5)

## 2015-02-16 LAB — CBC WITH DIFFERENTIAL/PLATELET
BASO%: 0 % (ref 0.0–2.0)
Basophils Absolute: 0 10*3/uL (ref 0.0–0.1)
EOS ABS: 0.2 10*3/uL (ref 0.0–0.5)
EOS%: 2.5 % (ref 0.0–7.0)
HEMATOCRIT: 20.4 % — AB (ref 38.4–49.9)
HEMOGLOBIN: 6.5 g/dL — AB (ref 13.0–17.1)
LYMPH%: 26.8 % (ref 14.0–49.0)
MCH: 30.8 pg (ref 27.2–33.4)
MCHC: 31.9 g/dL — ABNORMAL LOW (ref 32.0–36.0)
MCV: 96.7 fL (ref 79.3–98.0)
MONO#: 0.5 10*3/uL (ref 0.1–0.9)
MONO%: 7 % (ref 0.0–14.0)
NEUT#: 4.6 10*3/uL (ref 1.5–6.5)
NEUT%: 63.7 % (ref 39.0–75.0)
PLATELETS: 506 10*3/uL — AB (ref 140–400)
RBC: 2.11 10*6/uL — ABNORMAL LOW (ref 4.20–5.82)
RDW: 17.2 % — ABNORMAL HIGH (ref 11.0–14.6)
WBC: 7.3 10*3/uL (ref 4.0–10.3)
lymph#: 2 10*3/uL (ref 0.9–3.3)
nRBC: 0 % (ref 0–0)

## 2015-02-16 LAB — COMPREHENSIVE METABOLIC PANEL
ALBUMIN: 2.3 g/dL — AB (ref 3.5–5.0)
ALK PHOS: 142 U/L (ref 40–150)
ALT: 17 U/L (ref 0–55)
ANION GAP: 7 meq/L (ref 3–11)
AST: 11 U/L (ref 5–34)
BUN: 24.2 mg/dL (ref 7.0–26.0)
CALCIUM: 8.4 mg/dL (ref 8.4–10.4)
CO2: 18 mEq/L — ABNORMAL LOW (ref 22–29)
Chloride: 115 mEq/L — ABNORMAL HIGH (ref 98–109)
Creatinine: 1.6 mg/dL — ABNORMAL HIGH (ref 0.7–1.3)
EGFR: 53 mL/min/{1.73_m2} — ABNORMAL LOW (ref >90)
Glucose: 114 mg/dl (ref 70–140)
POTASSIUM: 4.7 meq/L (ref 3.5–5.1)
Sodium: 140 mEq/L (ref 136–145)
Total Bilirubin: <0.3 mg/dL (ref 0.20–1.20)
Total Protein: 6.1 g/dL — ABNORMAL LOW (ref 6.4–8.3)

## 2015-02-16 LAB — PREPARE RBC (CROSSMATCH)

## 2015-02-16 MED ORDER — SODIUM CHLORIDE 0.9 % IJ SOLN
3.0000 mL | INTRAMUSCULAR | Status: DC | PRN
  Filled 2015-02-16: qty 10

## 2015-02-16 MED ORDER — DIPHENHYDRAMINE HCL 25 MG PO CAPS
25.0000 mg | ORAL_CAPSULE | Freq: Once | ORAL | Status: AC
  Administered 2015-02-16: 25 mg via ORAL

## 2015-02-16 MED ORDER — SODIUM CHLORIDE 0.9 % IJ SOLN
10.0000 mL | INTRAMUSCULAR | Status: DC | PRN
  Filled 2015-02-16: qty 10

## 2015-02-16 MED ORDER — SODIUM CHLORIDE 0.9 % IV SOLN
250.0000 mL | Freq: Once | INTRAVENOUS | Status: AC
  Administered 2015-02-16: 250 mL via INTRAVENOUS

## 2015-02-16 MED ORDER — ACETAMINOPHEN 325 MG PO TABS
ORAL_TABLET | ORAL | Status: AC
  Filled 2015-02-16: qty 2

## 2015-02-16 MED ORDER — HEPARIN SOD (PORK) LOCK FLUSH 100 UNIT/ML IV SOLN
250.0000 [IU] | INTRAVENOUS | Status: DC | PRN
  Filled 2015-02-16: qty 5

## 2015-02-16 MED ORDER — ACETAMINOPHEN 325 MG PO TABS
650.0000 mg | ORAL_TABLET | Freq: Once | ORAL | Status: AC
  Administered 2015-02-16: 650 mg via ORAL

## 2015-02-16 MED ORDER — SODIUM CHLORIDE 0.9 % IV SOLN: 250.0000 mL | Freq: Once | INTRAVENOUS | Status: DC

## 2015-02-16 MED ORDER — HEPARIN SOD (PORK) LOCK FLUSH 100 UNIT/ML IV SOLN
500.0000 [IU] | Freq: Every day | INTRAVENOUS | Status: DC | PRN
  Filled 2015-02-16: qty 5

## 2015-02-16 NOTE — Patient Instructions (Signed)
Transfusin de sangre  (Blood Transfusion) Charles [name] transfusin de sangre es un procedimiento en el cual se recibe sangre a travs de una va intravenosa. Puede necesitar una transfusin de sangre por una enfermedad, [place] o lesin. La sangre puede provenir de un donante o puede ser su propia sangre donada previamente. La sangre administrada en una transfusin se compone de diferentes tipos de clulas. Puede recibir lo siguiente:  Glbulos rojos. Estos transportan oxgeno y Optician, dispensing perdida.  Plaquetas. Estas controlan el sangrado.  Plasma. Este ayuda a la coagulacin sangunea. Si tiene hemofilia u otro trastorno de [place], tambin puede recibir otro tipo de hemoderivados. INFORME A SU MDICO:  Cualquier alergia que tenga.  Todos los [employer], incluidos vitaminas, hierbas, gotas oftlmicas, cremas y medicamentos de venta libre.  Problemas previos que usted o los [employer] de su familia hayan tenido con el uso de anestsicos.  Enfermedades de la sangre que tenga.  Si tiene cirugas previas.  Si tiene [employer].  Cualquier reaccin previa que haya tenido durante una transfusin sangunea.  RIESGOS Y COMPLICACIONES En general, se trata de un procedimiento seguro. Sin embargo, pueden presentarse problemas, por ejemplo:  Nurse, mental health a algn componente de la sangre donada.  Cristy Hilts. Esta puede ser Charles [name] reaccin a los glbulos blancos de la sangre transfundida.  Sobrecarga de hierro. Esto puede suceder por haber recibido muchas transfusiones.  Lesin pulmonar aguda relacionada con la transfusin (LPART). Esta es una reaccin poco frecuente que causa dao pulmonar. La causa no se conoce.Esta lesin puede ocurrir unas horas o varios das despus de la transfusin.  Reacciones hemolticas repentinas (agudas) o lentas. Esto sucede si la sangre no es compatible con las clulas de la transfusin. El sistema de defensa del cuerpo (sistema  inmunitario) puede tratar de atacar a las clulas nuevas. Esta complicacin es poco frecuente.  Infeccin. Esto es raro. ANTES DEL PROCEDIMIENTO  Es posible que le realicen un anlisis de sangre para determinar su tipo de [place]. Esto es necesario para saber qu clase de sangre aceptar el cuerpo.  Si planifica someterse a [place], puede donar su propia sangre. Esto es en caso de que necesite una transfusin.  Si tuvo una reaccin alrgica a una transfusin en el pasado, tal vez le administren un medicamento que ayude a evitarla. Tome este medicamento solamente como se lo haya indicado el mdico.  Le controlarn la temperatura, la presin arterial y el pulso antes de la transfusin. PROCEDIMIENTO   Le insertarn una va intravenosa en el brazo o la [place].  La bolsa de sangre donada se conectar a la va intravenosa y Pharmacist, community en la vena.  Le controlarn con frecuencia la temperatura, la presin arterial y el pulso durante de la transfusin. Este control se realiza para detectar signos tempranos de una reaccin a la transfusin.  Si tiene signos o sntomas de Hospital doctor, se suspender la transfusin y tal vez le administren un medicamento.  Cuando finalice la transfusin, le retirarn la va intravenosa.  Se puede aplicar presin en el lugar donde se coloc la va intravenosa.  Le colocarn una venda (vendaje). Este procedimiento puede variar segn el mdico y el hospital. DESPUS DEL PROCEDIMIENTO  Le controlarn la presin arterial, la temperatura y el pulso peridicamente.   Esta informacin no tiene Marine scientist el consejo del mdico. Asegrese de hacerle al mdico cualquier pregunta que tenga.   Document Released: 01/17/2005 Document Revised: 02/07/2014 Elsevier Interactive Patient Education [employer].

## 2015-02-17 LAB — TYPE AND SCREEN
ABO/RH(D): O POS
Antibody Screen: NEGATIVE
Unit division: 0
Unit division: 0

## 2015-02-19 ENCOUNTER — Ambulatory Visit (HOSPITAL_BASED_OUTPATIENT_CLINIC_OR_DEPARTMENT_OTHER): Payer: Self-pay

## 2015-02-19 ENCOUNTER — Ambulatory Visit (HOSPITAL_BASED_OUTPATIENT_CLINIC_OR_DEPARTMENT_OTHER): Payer: Self-pay | Admitting: Oncology

## 2015-02-19 ENCOUNTER — Other Ambulatory Visit: Payer: Self-pay | Admitting: Oncology

## 2015-02-19 ENCOUNTER — Other Ambulatory Visit (HOSPITAL_BASED_OUTPATIENT_CLINIC_OR_DEPARTMENT_OTHER): Payer: Self-pay

## 2015-02-19 ENCOUNTER — Ambulatory Visit: Payer: Self-pay

## 2015-02-19 VITALS — BP 139/78 | HR 76 | Temp 97.6°F | Resp 18 | Ht 63.0 in | Wt 155.0 lb

## 2015-02-19 DIAGNOSIS — Z5112 Encounter for antineoplastic immunotherapy: Secondary | ICD-10-CM

## 2015-02-19 DIAGNOSIS — N179 Acute kidney failure, unspecified: Secondary | ICD-10-CM

## 2015-02-19 DIAGNOSIS — M899 Disorder of bone, unspecified: Secondary | ICD-10-CM

## 2015-02-19 DIAGNOSIS — N183 Chronic kidney disease, stage 3 (moderate): Secondary | ICD-10-CM

## 2015-02-19 DIAGNOSIS — C9000 Multiple myeloma not having achieved remission: Secondary | ICD-10-CM

## 2015-02-19 DIAGNOSIS — C7951 Secondary malignant neoplasm of bone: Secondary | ICD-10-CM | POA: Insufficient documentation

## 2015-02-19 DIAGNOSIS — C9002 Multiple myeloma in relapse: Secondary | ICD-10-CM

## 2015-02-19 DIAGNOSIS — G893 Neoplasm related pain (acute) (chronic): Secondary | ICD-10-CM

## 2015-02-19 LAB — CBC WITH DIFFERENTIAL/PLATELET
BASO%: 0 % (ref 0.0–2.0)
BASOS ABS: 0 10*3/uL (ref 0.0–0.1)
EOS ABS: 0 10*3/uL (ref 0.0–0.5)
EOS%: 0 % (ref 0.0–7.0)
HEMATOCRIT: 28.9 % — AB (ref 38.4–49.9)
HEMOGLOBIN: 9.7 g/dL — AB (ref 13.0–17.1)
LYMPH#: 2.2 10*3/uL (ref 0.9–3.3)
LYMPH%: 17.5 % (ref 14.0–49.0)
MCH: 31.7 pg (ref 27.2–33.4)
MCHC: 33.6 g/dL (ref 32.0–36.0)
MCV: 94.4 fL (ref 79.3–98.0)
MONO#: 1.4 10*3/uL — AB (ref 0.1–0.9)
MONO%: 11.2 % (ref 0.0–14.0)
NEUT#: 8.7 10*3/uL — ABNORMAL HIGH (ref 1.5–6.5)
NEUT%: 71.3 % (ref 39.0–75.0)
PLATELETS: 555 10*3/uL — AB (ref 140–400)
RBC: 3.06 10*6/uL — ABNORMAL LOW (ref 4.20–5.82)
RDW: 16.7 % — AB (ref 11.0–14.6)
WBC: 12.3 10*3/uL — ABNORMAL HIGH (ref 4.0–10.3)

## 2015-02-19 LAB — COMPREHENSIVE METABOLIC PANEL
ALBUMIN: 2.6 g/dL — AB (ref 3.5–5.0)
ALK PHOS: 143 U/L (ref 40–150)
ALT: 18 U/L (ref 0–55)
ANION GAP: 8 meq/L (ref 3–11)
AST: 14 U/L (ref 5–34)
BUN: 36.5 mg/dL — AB (ref 7.0–26.0)
CALCIUM: 7.7 mg/dL — AB (ref 8.4–10.4)
CHLORIDE: 115 meq/L — AB (ref 98–109)
CO2: 15 mEq/L — ABNORMAL LOW (ref 22–29)
Creatinine: 1.8 mg/dL — ABNORMAL HIGH (ref 0.7–1.3)
EGFR: 47 mL/min/{1.73_m2} — ABNORMAL LOW (ref >90)
Glucose: 94 mg/dl (ref 70–140)
POTASSIUM: 4.4 meq/L (ref 3.5–5.1)
Sodium: 138 mEq/L (ref 136–145)
Total Bilirubin: <0.3 mg/dL (ref 0.20–1.20)
Total Protein: 6.4 g/dL (ref 6.4–8.3)

## 2015-02-19 MED ORDER — ONDANSETRON HCL 8 MG PO TABS
8.0000 mg | ORAL_TABLET | Freq: Once | ORAL | Status: AC
  Administered 2015-02-19: 8 mg via ORAL

## 2015-02-19 MED ORDER — TRAMADOL HCL 50 MG PO TABS: 50.0000 mg | ORAL_TABLET | Freq: Four times a day (QID) | ORAL | Status: DC | PRN

## 2015-02-19 MED ORDER — BORTEZOMIB CHEMO SQ INJECTION 3.5 MG (2.5MG/ML)
1.3000 mg/m2 | Freq: Once | INTRAMUSCULAR | Status: AC
  Administered 2015-02-19: 2.25 mg via SUBCUTANEOUS
  Filled 2015-02-19: qty 2.25

## 2015-02-19 MED ORDER — LORAZEPAM 0.5 MG PO TABS: 0.5000 mg | ORAL_TABLET | Freq: Every evening | ORAL | Status: DC | PRN

## 2015-02-19 MED ORDER — ONDANSETRON HCL 8 MG PO TABS
ORAL_TABLET | ORAL | Status: AC
  Filled 2015-02-19: qty 1

## 2015-02-19 NOTE — Patient Instructions (Signed)
[facility] Discharge Instructions for Patients Receiving Chemotherapy  Today you received the following chemotherapy agents velcade.  To help prevent nausea and vomiting after your treatment, we encourage you to take your nausea medication .   If you develop nausea and vomiting that is not controlled by your nausea medication, call the clinic.   BELOW ARE SYMPTOMS THAT SHOULD BE REPORTED IMMEDIATELY:  *FEVER GREATER THAN 100.5 F  *CHILLS WITH OR WITHOUT FEVER  NAUSEA AND VOMITING THAT IS NOT CONTROLLED WITH YOUR NAUSEA MEDICATION  *UNUSUAL SHORTNESS OF BREATH  *UNUSUAL BRUISING OR BLEEDING  TENDERNESS IN MOUTH AND THROAT WITH OR WITHOUT PRESENCE OF ULCERS  *URINARY PROBLEMS  *BOWEL PROBLEMS  UNUSUAL RASH Items with * indicate a potential emergency and should be followed up as soon as possible.  Feel free to call the clinic you have any questions or concerns. The clinic phone number is [phone].  Please show the [place] at check-in to the Emergency Department and triage nurse.

## 2015-02-19 NOTE — Progress Notes (Signed)
VO OK to treat with creatinine today of 1.8  Heather Boelter NP/Jan Doyle Askew RN VO OK to treat per Dr. Jana Hakim.

## 2015-02-19 NOTE — Progress Notes (Signed)
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 The pain was worse with coughing. Exam was unremarkable, and he was treated with Mobic. No labs were obtained. On 01/30/2014 he presented to the emergency room at [facility] with similar complaints and also reported a 10 pound weight loss over the past month. A chest x-ray was significant only for mild atelectasis, but ACT NGO of the chest 01/30/2014, while it showed no pulmonary emboli, showed an osteolytic lesion destroying the manubrium of the sternum. There was extra osseous extension into the anterior mediastinum.  The patient was admitted and found to have a creatinine of 1.64 with a GFR of 51. Bilateral renal ultrasound showed no hydronephrosis or masses, and normal size and cortical thickness of both kidneys, though there was mild diffuse echogenicity. Urine total protein was 145 mg/dL and immunofixation showed a monoclonal IgG kappa protein as well as monoclonal free kappa light chains. On 01/31/2014 serum protein electrophoresis showed an M spike of 2.22 g/dL with a total protein of 8.3 and albumin 3.4. Kappa lambda light chains from the serum obtained 02/04/2014 showed 10.50 mg/dL on the, 1.74 in the lambda, with an elevated ratio at  6.03. Beta-2 microglobulin was 7.36. LDH was normal at 184 and HIV antibody was nonreactive.  Biopsy of the manubrial mass 02/03/2014 showed extensive infiltration of the bone by sheets of atypical plasma cells, positive for CD138, kappa restricted (SZA 16-23). Right iliac bone marrow biopsy [name] 05/21/2014 (FZB 16-1) showed an average of 12% plasma cells with numerous aggregates and sheets of atypical plasma cells, which were kappa restricted. Cytogenetic analysis is pending  The patient's subsequent history is as detailed below  INTERVAL HISTORY: Charles [name] returns today for follow-up of his relapsed multiple myeloma , accompanied by his wife and young son. Today is day 1 cycle 3 of weekly bortezomib, which he is receiving together with dexamethasone 20 mg on Tuesdays and Wednesdays. --the original plan called for him also to receive cyclophosphamide but through a mix up in orders that was never initiated. Despite that he is showing signs of response. Specifically his kappa/lambda ratio is trending down and his M spike has dropped by factor of 16.   REVIEW OF SYSTEMS: Charles [name] is also improved clinically. He has much less pain than before. He still cannot function normally and is not able to work. This is very worrisome to him because it might compromise his work permanent. He requested a letter today stating his diagnosis and the fact that he is receiving active treatment at present. The nights when he receives dexamethasone he can't sleep. He is having a little bit of swelling from the steroids. Aside from this a detailed review of systems today was stable  PAST MEDICAL HISTORY: Past Medical History  Diagnosis Date  . IgG myeloma ([place])   . CKD (chronic kidney  disease), stage III   . Hypercholesterolemia   . Lytic bone lesions on xray     PAST SURGICAL HISTORY: No past surgical history on file.  FAMILY HISTORY Family History  Problem Relation Age of Onset  . Diabetes Father     The patient's parents are still living, in their late 24's. The patient has 2 brothers, 3 sisters. There is noc cancer in the fmaily to his knowledge.  SOCIAL HISTORY:  Originally from [facility], moved here 19 years ago. He works in Nurse, adult. Married (wife's name is [name]), lives in [place]. He has 5 children ages 53, 80, 5, 44 and 29 months [name], [name], [name] and Osino) in good health. Best number to call him is [phone]    ADVANCED DIRECTIVES: not  In place   HEALTH MAINTENANCE: Social History  Substance Use Topics  . Smoking status: Never Smoker   . Smokeless tobacco: Never Used  . Alcohol Use: No     Colonoscopy:  PSA:  Bone density:  Lipid panel:  Allergies  Allergen Reactions  . Ciprofloxacin Rash  . Mobic [Meloxicam] Rash    Current Outpatient Prescriptions  Medication Sig Dispense Refill  . acyclovir (ZOVIRAX) 200 MG capsule Take 2 capsules (400 mg total) by mouth daily. 30 capsule 2  . dexamethasone (DECADRON) 4 MG tablet Tome 5 pastillas al desayuno cada martes y miercoles (take 5 tablets with breakfast every Tues and Wed) 50 tablet 12  . diphenhydrAMINE (SOMINEX) 25 MG tablet Take 25 mg by mouth at bedtime as needed for allergies. Reported on 02/12/2015    . fluconazole (DIFLUCAN) 100 MG tablet Take 1 tablet (100 mg total) by mouth daily. (Patient not taking: Reported on 01/28/2015) 10 tablet 0  . LORazepam (ATIVAN) 0.5 MG tablet Take 1 tablet (0.5 mg total) by mouth at bedtime as needed for anxiety. 30 tablet 0  . ondansetron (ZOFRAN) 8 MG tablet Take 1 tablet (8 mg total) by mouth every 8 (eight) hours as needed for nausea or vomiting. 20 tablet 1  . pantoprazole (PROTONIX) 20 MG tablet Take 1 tablet (20 mg total) by mouth 2 (two) times daily. 60 tablet 3  . sulfamethoxazole-trimethoprim (BACTRIM DS,SEPTRA DS) 800-160 MG tablet Take 1 tablet by mouth 2 (two) times daily. 60 tablet 1  . traMADol (ULTRAM) 50 MG  tablet Take 1 tablet (50 mg total) by mouth every 6 (six) hours as needed. 60 tablet 0   No current facility-administered medications for this visit.   Facility-Administered Medications Ordered in Other Visits  Medication Dose Route Frequency Provider Last Rate Last Dose  . bortezomib SQ (VELCADE) chemo injection 2.25 mg  1.3 mg/m2 (Treatment Plan Actual) Subcutaneous Once [name], MD        OBJECTIVE: Charles [name] man who appears stated age 23 Vitals:   02/19/15 1233  BP: 139/78  Pulse: 76  Temp: 97.6 F (36.4 C)  Resp: 18     Body mass index is 27.46 kg/(m^2).    ECOG FS:2 - Symptomatic, <50% confined to bed   Sclerae unicteric, pupils round and equal Oropharynx clear and moist-- no thrush or other lesions No cervical or supraclavicular adenopathy Lungs no rales or rhonchi Heart regular rate and rhythm Abd soft, nontender, positive bowel sounds MSK no focal spinal tenderness, no  Chest wall tenderness to AP or lateral chest wall compression Neuro: nonfocal, well oriented, appropriate affect   LAB RESULTS:   Ref Range 7d ago    IgG, Qn,  Serum 700 - 1600 mg/dL 701   IgA, Qn, Serum 90 - 386 mg/dL 82 (L)   IgM, Qn, Serum 20 - 172 mg/dL 43   Total Protein 6.0 - 8.5 g/dL 5.6 (L)   Albumin SerPl Elph-Mcnc 2.9 - 4.4 g/dL 2.3 (L)   Alpha 1 0.0 - 0.4 g/dL 0.4   Alpha2 Glob SerPl Elph-Mcnc 0.4 - 1.0 g/dL 1.3 (H)   B-Globulin SerPl Elph-Mcnc 0.7 - 1.3 g/dL 0.8   Gamma Glob SerPl Elph-Mcnc 0.4 - 1.8 g/dL 0.7   M Protein SerPl Elph-Mcnc Not Observed g/dL 0.5 (H)   Globulin, Total 2.2 - 3.9 g/dL 3.3   Albumin/Glob SerPl 0.7 - 1.7  0.7   IFE 1  Comment   Comments: Immunofixation shows IgG monoclonal protein with kappa light chain  specificity.     Please Note  Comment   Comments: Protein electrophoresis scan will follow via computer, mail, or  courier delivery.     Resulting Agency [facility]     Narrative     Testing performed at: [BN]  [facility], [address], [place], [place], [address], Phone: [phone], Laboratory Director: Lindon Romp, MD       Specimen Collected: 02/12/15 2:33 PM Last Resulted: 02/16/15 2:38 PM                     CMP     Component Value Date/Time   NA 138 02/19/2015 1217   NA 144 01/27/2015 0605   K 4.4 02/19/2015 1217   K 4.8 01/27/2015 0605   CL 117* 01/27/2015 0605   CO2 15* 02/19/2015 1217   CO2 18* 01/27/2015 0605   GLUCOSE 94 02/19/2015 1217   GLUCOSE 116* 01/27/2015 0605   BUN 36.5* 02/19/2015 1217   BUN 64* 01/27/2015 0605   CREATININE 1.8* 02/19/2015 1217   CREATININE 3.03* 01/27/2015 0605   CALCIUM 7.7* 02/19/2015 1217   CALCIUM 6.3* 01/27/2015 0605   PROT 6.4 02/19/2015 1217   PROT 5.6* 02/12/2015 1433   PROT 4.8* 01/25/2015 0740   ALBUMIN 2.6* 02/19/2015 1217   ALBUMIN 1.6* 01/27/2015 0605   AST 14 02/19/2015 1217   AST 18 01/25/2015 0740   ALT 18 02/19/2015 1217   ALT 32 01/25/2015 0740   ALKPHOS 143 02/19/2015 1217   ALKPHOS 107 01/25/2015 0740   BILITOT <0.30 02/19/2015 1217   BILITOT 0.1* 01/25/2015 0740   GFRNONAA 24* 01/27/2015 0605   GFRAA 28* 01/27/2015 0605    INo results found for: SPEP, UPEP  Lab Results  Component Value Date   WBC 12.3* 02/19/2015   NEUTROABS 8.7* 02/19/2015   HGB 9.7* 02/19/2015   HCT 28.9* 02/19/2015   MCV 94.4 02/19/2015   PLT 555* 02/19/2015      Chemistry      Component Value Date/Time   NA 138 02/19/2015 1217   NA 144 01/27/2015 0605   K 4.4 02/19/2015 1217   K 4.8 01/27/2015 0605   CL 117* 01/27/2015 0605   CO2 15* 02/19/2015 1217   CO2 18* 01/27/2015 0605   BUN 36.5* 02/19/2015 1217   BUN 64* 01/27/2015 0605   CREATININE 1.8* 02/19/2015 1217   CREATININE 3.03* 01/27/2015 0605      Component Value Date/Time   CALCIUM 7.7* 02/19/2015 1217   CALCIUM 6.3* 01/27/2015 0605   ALKPHOS 143 02/19/2015 1217   ALKPHOS 107 01/25/2015 0740   AST 14 02/19/2015 1217   AST 18 01/25/2015 0740    ALT 18 02/19/2015  1217   ALT 32 01/25/2015 0740   BILITOT <0.30 02/19/2015 1217   BILITOT 0.1* 01/25/2015 0740       No results found for: LABCA2  No components found for: LABCA125  No results for input(s): INR in the last 168 hours.  Urinalysis    Component Value Date/Time   COLORURINE YELLOW 01/26/2015 0742   APPEARANCEUR CLOUDY* 01/26/2015 0742   LABSPEC 1.014 01/26/2015 0742   LABSPEC 1.030 01/22/2015 1018   PHURINE 5.0 01/26/2015 0742   PHURINE 5.0 01/22/2015 [place] 01/26/2015 0742   GLUCOSEU Negative 01/22/2015 1018   HGBUR LARGE* 01/26/2015 0742   HGBUR Large 01/22/2015 1018   BILIRUBINUR NEGATIVE 01/26/2015 0742   BILIRUBINUR Negative 01/22/2015 1018   KETONESUR NEGATIVE 01/26/2015 0742   KETONESUR Negative 01/22/2015 1018   PROTEINUR 30* 01/26/2015 0742   PROTEINUR 2000 01/22/2015 1018   UROBILINOGEN 0.2 01/22/2015 1018   UROBILINOGEN 0.2 01/31/2014 1050   NITRITE NEGATIVE 01/26/2015 0742   NITRITE Negative 01/22/2015 1018   LEUKOCYTESUR NEGATIVE 01/26/2015 0742   LEUKOCYTESUR Color Interference due to blood in urine 01/22/2015 1018    STUDIES: Dg Ribs Unilateral W/chest Right  01/20/2015  CLINICAL DATA:  Multiple myeloma with right-sided chest pain EXAM: RIGHT RIBS AND CHEST - 3+ VIEW COMPARISON:  CT chest 02/03/2014. Chest CT 01/30/2014. Chest x-ray 01/05/2014 FINDINGS: Heart size within normal limits. Negative for heart failure. Lungs are clear without infiltrate or effusion Extrapleural soft tissue thickening in the right lung apex. This has been present on prior studies and may be due to scarring. There is a chronic healed fracture of the right anterior second rib which was noted on prior studies. The right second rib also shows abnormal infiltrative low density on CT suggestive of myeloma. No definite bony destruction is seen on the current study. Chronic fracture of the right anterior fifth rib unchanged from prior studies. Negative for  acute fracture. IMPRESSION: No acute cardiopulmonary disease. Chronic healed fractures right second and fifth ribs anteriorly. Right apical pleural density is similar prior studies and may be due to benign pleural thickening. Based on the prior CT there could be myeloma in the right second rib however no bony destruction is identified on the current study. Electronically Signed   By: Franchot Gallo M.D.   On: 01/20/2015 15:27   US Renal  01/22/2015  CLINICAL DATA:  Renal failure.  Chronic kidney disease stage 3. EXAM: RENAL / URINARY TRACT ULTRASOUND COMPLETE COMPARISON:  01/01/2015 FINDINGS: Right Kidney: Length: 11.1. Increased parenchymal echogenicity. No mass or hydronephrosis visualized. Left Kidney: Length: 12.2 cm. Increased parenchymal echogenicity. There is a cyst arising from the upper pole measuring 1.1 x 1.2 x 0.9 cm peer no mass or hydronephrosis. Bladder: Appears normal for degree of bladder distention. IMPRESSION: 1. No acute findings 2. Bilateral echogenic kidneys compatible with chronic medical renal disease. 3. Left kidney cyst. Electronically Signed   By: Kerby Moors M.D.   On: 01/22/2015 14:23    ASSESSMENT: 40 y.o. 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;        I: CyBorD started  January 2016  (a) dexamethasone 20 mg/d every TU/WED started 02/04/2014  (b) bortezomib sQ days 1,4,8,11 of every 21 day cycle started 02/10/2014  (c) cyclophosphamide 300 mg/M2 IV weekly started 02/13/2014       2: Lenalidomide 48m daily started  07/26/2014  (2) chronic kidney disease stage III  (3) hypercalcemia: zolendronate started 02/10/2014-- repeat every 4 weeks initially, then every 12 weeks  (4) ID prophylaxis:  (a) influenza and Pneumovax [PV13] vaccines 02/07/2014  (b) acyclovir started 02/07/2014  (c) HIV negative 01/31/2014  (d) Pneumovax P23 too be given after March 2016  (5). The patient is not a transplant candidate for financial reasons  (6) bilateral foot rash: Started on Septra DS and Diflucan 09/03/2014, to be continued for 10 days.  (7) Multiple Myeloma relapse in December 2016.   (a) starting 02/05/15: dexamethasone 28mevery Tuesday and Wednesday; bortezomib sQ weekly,   (b) Septra DS twice daily  (c) acyclovir 4005maily    PLAN: Charles [name] is tolerating the bortezomib and dexamethasone well. Clinically he is clearly improved. He has considerably less pain. He is more active and there has been improvement although not resolution of his kidney dysfunction. His serum proteins are trending down( the M spike has dropped from 8-0.5 and the/lambda ratio is also decreasing).   I would like to get him a transplant but I will not be able to do that unless he has some form of insurance. Today he requested a letter stating that he is a patient here and currently is unable to work. I was glad to provide that for him.    the biggest problem he is having his insomnia from the dexamethasone. I wrote him for lorazepam today hopefully that will help him sleep on the 2 days a week where he receives the dexamethasone.    once we reach plateau , I will discontinue the dexamethasone and start every 3 week cyclophosphamide, initially with a bortezomib, and later cyclophosphamide alone. I am hopeful that this will allow him a prolongation of the remission without problems regarding neuropathy or other side effects related to steroids.    they wanted to know how long I thought he might live. I do not have the answer but I thought 2  years might be a reasonable period of time for them to plan around.    because of the kidney dysfunction I am stopping the zolendronate and switching him to Denosumab. That will start next week.   Otherwise maximal return to see me a week from now. He knows to call for any problems that may develop before that visit..  Marland KitchenAGChauncey CruelD   02/19/2015 2:26 PM

## 2015-02-20 ENCOUNTER — Telehealth: Payer: Self-pay | Admitting: Oncology

## 2015-02-20 MED FILL — LORazepam 0.5 MG TABS: 0.5 | 30 days supply | Qty: 30 | Fill #0

## 2015-02-20 MED FILL — traMADol HCL 50 MG TABS: 50 | 15 days supply | Qty: 60 | Fill #0

## 2015-02-20 NOTE — Telephone Encounter (Signed)
Appointments added to feb lab and tx and patient will get a new avs on 1/26

## 2015-02-24 ENCOUNTER — Encounter: Payer: Self-pay | Admitting: Oncology

## 2015-02-24 NOTE — Progress Notes (Signed)
I placed form for dr. Jana Hakim to sign for velcade asst.

## 2015-02-25 ENCOUNTER — Other Ambulatory Visit: Payer: Self-pay | Admitting: *Deleted

## 2015-02-26 ENCOUNTER — Ambulatory Visit (HOSPITAL_BASED_OUTPATIENT_CLINIC_OR_DEPARTMENT_OTHER): Payer: Self-pay | Admitting: Nurse Practitioner

## 2015-02-26 ENCOUNTER — Other Ambulatory Visit: Payer: Self-pay | Admitting: *Deleted

## 2015-02-26 ENCOUNTER — Ambulatory Visit: Payer: Self-pay

## 2015-02-26 ENCOUNTER — Ambulatory Visit (HOSPITAL_BASED_OUTPATIENT_CLINIC_OR_DEPARTMENT_OTHER): Payer: Self-pay

## 2015-02-26 ENCOUNTER — Other Ambulatory Visit (HOSPITAL_BASED_OUTPATIENT_CLINIC_OR_DEPARTMENT_OTHER): Payer: Self-pay

## 2015-02-26 ENCOUNTER — Encounter: Payer: Self-pay | Admitting: Nurse Practitioner

## 2015-02-26 ENCOUNTER — Other Ambulatory Visit: Payer: Self-pay | Admitting: Nurse Practitioner

## 2015-02-26 VITALS — BP 118/66 | HR 71 | Temp 97.9°F | Resp 18 | Ht 63.0 in | Wt 156.2 lb

## 2015-02-26 DIAGNOSIS — N183 Chronic kidney disease, stage 3 (moderate): Secondary | ICD-10-CM

## 2015-02-26 DIAGNOSIS — C9002 Multiple myeloma in relapse: Secondary | ICD-10-CM

## 2015-02-26 DIAGNOSIS — M899 Disorder of bone, unspecified: Secondary | ICD-10-CM

## 2015-02-26 DIAGNOSIS — Z5112 Encounter for antineoplastic immunotherapy: Secondary | ICD-10-CM

## 2015-02-26 DIAGNOSIS — C9000 Multiple myeloma not having achieved remission: Secondary | ICD-10-CM

## 2015-02-26 LAB — COMPREHENSIVE METABOLIC PANEL
ALT: 13 U/L (ref 0–55)
AST: 15 U/L (ref 5–34)
Albumin: 2.7 g/dL — ABNORMAL LOW (ref 3.5–5.0)
Alkaline Phosphatase: 128 U/L (ref 40–150)
Anion Gap: 8 mEq/L (ref 3–11)
BUN: 28 mg/dL — AB (ref 7.0–26.0)
CALCIUM: 8.6 mg/dL (ref 8.4–10.4)
CHLORIDE: 114 meq/L — AB (ref 98–109)
CO2: 17 mEq/L — ABNORMAL LOW (ref 22–29)
CREATININE: 1.5 mg/dL — AB (ref 0.7–1.3)
EGFR: 59 mL/min/{1.73_m2} — ABNORMAL LOW (ref >90)
GLUCOSE: 88 mg/dL (ref 70–140)
POTASSIUM: 4 meq/L (ref 3.5–5.1)
SODIUM: 140 meq/L (ref 136–145)
Total Bilirubin: <0.3 mg/dL (ref 0.20–1.20)
Total Protein: 6.5 g/dL (ref 6.4–8.3)

## 2015-02-26 LAB — CBC WITH DIFFERENTIAL/PLATELET
BASO%: 0.2 % (ref 0.0–2.0)
Basophils Absolute: 0 10*3/uL (ref 0.0–0.1)
EOS%: 0.2 % (ref 0.0–7.0)
Eosinophils Absolute: 0 10*3/uL (ref 0.0–0.5)
HEMATOCRIT: 28.1 % — AB (ref 38.4–49.9)
HGB: 9.4 g/dL — ABNORMAL LOW (ref 13.0–17.1)
LYMPH#: 2.7 10*3/uL (ref 0.9–3.3)
LYMPH%: 29.7 % (ref 14.0–49.0)
MCH: 31.9 pg (ref 27.2–33.4)
MCHC: 33.3 g/dL (ref 32.0–36.0)
MCV: 95.7 fL (ref 79.3–98.0)
MONO#: 1.5 10*3/uL — ABNORMAL HIGH (ref 0.1–0.9)
MONO%: 16.4 % — AB (ref 0.0–14.0)
NEUT#: 4.8 10*3/uL (ref 1.5–6.5)
NEUT%: 53.5 % (ref 39.0–75.0)
Platelets: 442 10*3/uL — ABNORMAL HIGH (ref 140–400)
RBC: 2.94 10*6/uL — ABNORMAL LOW (ref 4.20–5.82)
RDW: 16.9 % — AB (ref 11.0–14.6)
WBC: 9 10*3/uL (ref 4.0–10.3)

## 2015-02-26 MED ORDER — BORTEZOMIB CHEMO SQ INJECTION 3.5 MG (2.5MG/ML)
1.3000 mg/m2 | Freq: Once | INTRAMUSCULAR | Status: AC
  Administered 2015-02-26: 2.25 mg via SUBCUTANEOUS
  Filled 2015-02-26: qty 2.25

## 2015-02-26 MED ORDER — ONDANSETRON HCL 8 MG PO TABS
8.0000 mg | ORAL_TABLET | Freq: Once | ORAL | Status: AC
  Administered 2015-02-26: 8 mg via ORAL

## 2015-02-26 MED ORDER — DENOSUMAB 120 MG/1.7ML ~~LOC~~ SOLN
120.0000 mg | Freq: Once | SUBCUTANEOUS | Status: AC
  Administered 2015-02-26: 120 mg via SUBCUTANEOUS
  Filled 2015-02-26: qty 1.7

## 2015-02-26 MED ORDER — ONDANSETRON HCL 8 MG PO TABS
ORAL_TABLET | ORAL | Status: AC
  Filled 2015-02-26: qty 1

## 2015-02-26 MED ORDER — ACYCLOVIR 200 MG PO CAPS: 400.0000 mg | ORAL_CAPSULE | Freq: Every day | ORAL | Status: DC

## 2015-02-26 MED ORDER — ONDANSETRON HCL 8 MG PO TABS: 8.0000 mg | ORAL_TABLET | Freq: Three times a day (TID) | ORAL | Status: DC | PRN

## 2015-02-26 MED FILL — ACYCLOVIR 200 MG CAPSULE: 200 | 30 days supply | Qty: 60 | Fill #0

## 2015-02-26 MED FILL — ONDANSETRON HCL 8 MG TABLET: 8 | 6 days supply | Qty: 20 | Fill #0

## 2015-02-26 MED FILL — MELOXICAM 15 MG TABLET: 15 | 30 days supply | Qty: 30 | Fill #1

## 2015-02-26 NOTE — Progress Notes (Signed)
[facility]  [phone]) [phone] Fax:(336) [phone]    ID: Charles [name] DOB: 01/26/1976  MR#: [id number]  BBC#:[id number]  Patient Care Team: Tresa Garter, MD as PCP - General (Internal Medicine) [name], NP as Nurse Practitioner (Hematology and Oncology) [name], MD as Consulting Physician (Oncology) PCP: [name], MD GYN: SU:  OTHER MD:  CHIEF COMPLAINT: multiple myeloma  CURRENT TREATMENT:  Bortezomib, dexamethasone, Denosumab  HISTORY  OF MULTIPLE MYELOMA: From the original intake note January 2016:  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 The pain was worse with coughing. Exam was unremarkable, and he was treated with Mobic. No labs were obtained. On 01/30/2014 he presented to the emergency room at [facility] with similar complaints and also reported a 10 pound weight loss over the past month. A chest x-ray was significant only for mild atelectasis, but ACT NGO of the chest 01/30/2014, while it showed no pulmonary emboli, showed an osteolytic lesion destroying the manubrium of the sternum. There was extra osseous extension into the anterior mediastinum.  The patient was admitted and found to have a creatinine of 1.64 with a GFR of 51. Bilateral renal ultrasound showed no hydronephrosis or masses, and normal size and cortical thickness of both kidneys, though there was mild diffuse echogenicity. Urine total protein was 145 mg/dL and immunofixation showed a monoclonal IgG kappa protein as well as monoclonal free kappa light chains. On 01/31/2014 serum protein electrophoresis showed an M spike of 2.22 g/dL with a total protein of 8.3 and albumin 3.4. Kappa lambda light chains from the serum obtained 02/04/2014 showed 10.50 mg/dL on the, 1.74 in the lambda, with an elevated ratio at  6.03. Beta-2 microglobulin was 7.36. LDH was normal at 184 and HIV antibody was nonreactive.  Biopsy of the manubrial mass 02/03/2014 showed extensive infiltration of the bone by sheets of atypical plasma cells, positive for CD138, kappa restricted (SZA 16-23). Right iliac bone marrow biopsy Charles [name] 05/21/2014 (FZB 16-1) showed an average of 12% plasma cells with numerous aggregates and sheets of atypical plasma cells, which were kappa restricted. Cytogenetic analysis is pending  The patient's subsequent history is as detailed below  INTERVAL HISTORY: Charles [name] returns today for follow-up of his relapsed multiple myeloma , accompanied by his wife. Today is day 1 cycle 4 of weekly bortezomib, which he is receiving together with dexamethasone 20 mg on Tuesdays and Wednesdays.  REVIEW OF SYSTEMS: Charles [name] is sleeping better with lorazepam on the days he has high dose steroid due. New since just yesterday is right hip pain. It is aggravated when walking or standing. Sitting down, he feels nothing. The tramadol is helpful. The pain to his right lower back is no worse. He denies fevers, chills, nausea, or vomiting. He has no mouth sores, rashes, or neuropathy symptoms. He is eating well and has gained a pound. He denies shortness of breath, chest pain, cough, or palpitations. A detailed review of system is otherwise stable.  PAST MEDICAL HISTORY: Past Medical History  Diagnosis Date  . IgG myeloma ([place])   . CKD (chronic kidney disease), stage III   . Hypercholesterolemia   . Lytic bone lesions on xray     PAST SURGICAL HISTORY: No past surgical history on file.  FAMILY HISTORY Family History  Problem Relation Age of Onset  . Diabetes Father  The patient's parents are still living, in their late 40's. The patient has 2 brothers, 3 sisters. There is noc cancer in the fmaily to his knowledge.  SOCIAL HISTORY:  Originally from [facility], moved here 19 years ago. He works in  Nurse, adult. Married (wife's name is Charles [name]), lives in [place]. He has 5 children ages 29, 66, 29, 40 and 64 months Charles [name], Charles [name], Charles [name]) in good health. Best number to call him is [phone]    ADVANCED DIRECTIVES: not  In place   HEALTH MAINTENANCE: Social History  Substance Use Topics  . Smoking status: Never Smoker   . Smokeless tobacco: Never Used  . Alcohol Use: No     Colonoscopy:  PSA:  Bone density:  Lipid panel:  Allergies  Allergen Reactions  . Ciprofloxacin Rash  . Mobic [Meloxicam] Rash    Current Outpatient Prescriptions  Medication Sig Dispense Refill  . dexamethasone (DECADRON) 4 MG tablet Tome 5 pastillas al desayuno cada martes y miercoles (take 5 tablets with breakfast every Tues and Wed) 50 tablet 12  . diphenhydrAMINE (SOMINEX) 25 MG tablet Take 25 mg by mouth at bedtime as needed for allergies. Reported on 02/12/2015    . LORazepam (ATIVAN) 0.5 MG tablet Take 1 tablet (0.5 mg total) by mouth at bedtime as needed for anxiety. 30 tablet 0  . pantoprazole (PROTONIX) 20 MG tablet Take 1 tablet (20 mg total) by mouth 2 (two) times daily. 60 tablet 3  . sulfamethoxazole-trimethoprim (BACTRIM DS,SEPTRA DS) 800-160 MG tablet Take 1 tablet by mouth 2 (two) times daily. 60 tablet 1  . traMADol (ULTRAM) 50 MG tablet Take 1 tablet (50 mg total) by mouth every 6 (six) hours as needed. 60 tablet 0  . acyclovir (ZOVIRAX) 200 MG capsule Take 2 capsules (400 mg total) by mouth daily. 60 capsule 1  . fluconazole (DIFLUCAN) 100 MG tablet Take 1 tablet (100 mg total) by mouth daily. (Patient not taking: Reported on 01/28/2015) 10 tablet 0  . ondansetron (ZOFRAN) 8 MG tablet Take 1 tablet (8 mg total) by mouth every 8 (eight) hours as needed for nausea or vomiting. 20 tablet 1   No current facility-administered medications for this visit.   Facility-Administered Medications Ordered in Other Visits  Medication Dose Route Frequency  Provider Last Rate Last Dose  . bortezomib SQ (VELCADE) chemo injection 2.25 mg  1.3 mg/m2 (Treatment Plan Actual) Subcutaneous Once [name], MD      . denosumab (XGEVA) injection 120 mg  120 mg Subcutaneous Once [name], MD        OBJECTIVE: Charles [name] man who appears stated age 40 Vitals:   02/26/15 1352  BP: 118/66  Pulse: 71  Temp: 97.9 F (36.6 C)  Resp: 18     Body mass index is 27.68 kg/(m^2).    ECOG FS:2 - Symptomatic, <50% confined to bed   Skin: warm, dry  HEENT: sclerae anicteric, conjunctivae pink, oropharynx clear. No thrush or mucositis.  Lymph Nodes: No cervical or supraclavicular lymphadenopathy  Lungs: clear to auscultation bilaterally, no rales, wheezes, or rhonci  Heart: regular rate and rhythm  Abdomen: round, soft, non tender, positive bowel sounds  Musculoskeletal: No focal spinal tenderness, no peripheral edema  Neuro: non focal, well oriented, positive affect    LAB RESULTS:   Ref Range 7d ago    IgG, Qn, Serum 700 - 1600 mg/dL 701   IgA, Qn, Serum 90 - 386 mg/dL 82 (L)  IgM, Qn, Serum 20 - 172 mg/dL 43   Total Protein 6.0 - 8.5 g/dL 5.6 (L)   Albumin SerPl Elph-Mcnc 2.9 - 4.4 g/dL 2.3 (L)   Alpha 1 0.0 - 0.4 g/dL 0.4   Alpha2 Glob SerPl Elph-Mcnc 0.4 - 1.0 g/dL 1.3 (H)   B-Globulin SerPl Elph-Mcnc 0.7 - 1.3 g/dL 0.8   Gamma Glob SerPl Elph-Mcnc 0.4 - 1.8 g/dL 0.7   M Protein SerPl Elph-Mcnc Not Observed g/dL 0.5 (H)   Globulin, Total 2.2 - 3.9 g/dL 3.3   Albumin/Glob SerPl 0.7 - 1.7  0.7   IFE 1  Comment   Comments: Immunofixation shows IgG monoclonal protein with kappa light chain  specificity.     Please Note  Comment   Comments: Protein electrophoresis scan will follow via computer, mail, or  courier delivery.     Resulting Agency [place] [place]     Narrative     Testing performed at: [BN] [facility], [address], [place], [place], [address], Phone: [phone], Laboratory Director:  Lindon Romp, MD       Specimen Collected: 02/12/15 2:33 PM Last Resulted: 02/16/15 2:38 PM                     CMP     Component Value Date/Time   NA 140 02/26/2015 1328   NA 144 01/27/2015 0605   K 4.0 02/26/2015 1328   K 4.8 01/27/2015 0605   CL 117* 01/27/2015 0605   CO2 17* 02/26/2015 1328   CO2 18* 01/27/2015 0605   GLUCOSE 88 02/26/2015 1328   GLUCOSE 116* 01/27/2015 0605   BUN 28.0* 02/26/2015 1328   BUN 64* 01/27/2015 0605   CREATININE 1.5* 02/26/2015 1328   CREATININE 3.03* 01/27/2015 0605   CALCIUM 8.6 02/26/2015 1328   CALCIUM 6.3* 01/27/2015 0605   PROT 6.5 02/26/2015 1328   PROT 5.6* 02/12/2015 1433   PROT 4.8* 01/25/2015 0740   ALBUMIN 2.7* 02/26/2015 1328   ALBUMIN 1.6* 01/27/2015 0605   AST 15 02/26/2015 1328   AST 18 01/25/2015 0740   ALT 13 02/26/2015 1328   ALT 32 01/25/2015 0740   ALKPHOS 128 02/26/2015 1328   ALKPHOS 107 01/25/2015 0740   BILITOT <0.30 02/26/2015 1328   BILITOT 0.1* 01/25/2015 0740   GFRNONAA 24* 01/27/2015 0605   GFRAA 28* 01/27/2015 0605    INo results found for: SPEP, UPEP  Lab Results  Component Value Date   WBC 9.0 02/26/2015   NEUTROABS 4.8 02/26/2015   HGB 9.4* 02/26/2015   HCT 28.1* 02/26/2015   MCV 95.7 02/26/2015   PLT 442* 02/26/2015      Chemistry      Component Value Date/Time   NA 140 02/26/2015 1328   NA 144 01/27/2015 0605   K 4.0 02/26/2015 1328   K 4.8 01/27/2015 0605   CL 117* 01/27/2015 0605   CO2 17* 02/26/2015 1328   CO2 18* 01/27/2015 0605   BUN 28.0* 02/26/2015 1328   BUN 64* 01/27/2015 0605   CREATININE 1.5* 02/26/2015 1328   CREATININE 3.03* 01/27/2015 0605      Component Value Date/Time   CALCIUM 8.6 02/26/2015 1328   CALCIUM 6.3* 01/27/2015 0605   ALKPHOS 128 02/26/2015 1328   ALKPHOS 107 01/25/2015 0740   AST 15 02/26/2015 1328   AST 18 01/25/2015 0740   ALT 13 02/26/2015 1328   ALT 32 01/25/2015 0740   BILITOT <0.30 02/26/2015 1328   BILITOT 0.1* 01/25/2015  0740  No results found for: LABCA2  No components found for: OLMBE675  No results for input(s): INR in the last 168 hours.  Urinalysis    Component Value Date/Time   COLORURINE YELLOW 01/26/2015 0742   APPEARANCEUR CLOUDY* 01/26/2015 0742   LABSPEC 1.014 01/26/2015 0742   LABSPEC 1.030 01/22/2015 1018   PHURINE 5.0 01/26/2015 0742   PHURINE 5.0 01/22/2015 1018   GLUCOSEU NEGATIVE 01/26/2015 0742   GLUCOSEU Negative 01/22/2015 1018   HGBUR LARGE* 01/26/2015 0742   HGBUR Large 01/22/2015 1018   BILIRUBINUR NEGATIVE 01/26/2015 0742   BILIRUBINUR Negative 01/22/2015 1018   KETONESUR NEGATIVE 01/26/2015 0742   KETONESUR Negative 01/22/2015 1018   PROTEINUR 30* 01/26/2015 0742   PROTEINUR 2000 01/22/2015 1018   UROBILINOGEN 0.2 01/22/2015 1018   UROBILINOGEN 0.2 01/31/2014 1050   NITRITE NEGATIVE 01/26/2015 0742   NITRITE Negative 01/22/2015 1018   LEUKOCYTESUR NEGATIVE 01/26/2015 0742   LEUKOCYTESUR Color Interference due to blood in urine 01/22/2015 1018    STUDIES: No results found.  ASSESSMENT: 40 y.o. 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;        I: CyBorD started January 2016  (a) dexamethasone 20 mg/d every TU/WED started 02/04/2014  (b) bortezomib sQ days 1,4,8,11 of every 21 day cycle started 02/10/2014  (c) cyclophosphamide 300 mg/M2 IV weekly started 02/13/2014       2: Lenalidomide 59m daily started 07/26/2014  (2) chronic kidney disease stage III  (3) hypercalcemia: zolendronate started 02/10/2014-- repeat  every 4 weeks initially, then every 12 weeks  (4) ID prophylaxis:  (a) influenza and Pneumovax [PV13] vaccines 02/07/2014  (b) acyclovir started 02/07/2014  (c) HIV negative 01/31/2014  (d) Pneumovax P23 too be given after March 2016  (5). The patient is not a transplant candidate for financial reasons  (6) bilateral foot rash: Started on Septra DS and Diflucan 09/03/2014, to be continued for 10 days.  (7) Multiple Myeloma relapse in December 2016.   (a) starting 02/05/15: dexamethasone 23mevery Tuesday and Wednesday; bortezomib sQ weekly,   (b) Septra DS twice daily  (c) acyclovir 40040maily    PLAN: Corin is doing well today. He is tolerating treatment well with few complaints. The labs were reviewed in detail. His creatinine continues to improve, now at 1.5. His right hip pain is less than 24 hours old, I advised him to let us [place] if this were to suddenly worsen, but his overall mobility and pain level is better than it was last month. He will proceed with cycle 4 of bortezomib as planned. He will also receive his first dose of denosumab today, which is being used in place of zolendronate due to his increased risk of kidney dysfunction.   Prashant will return in 1 week for cycle 5 of treatment. Per Dr. MagJana Hakim will discontinue the steroids and start q3week cyclophosphamide when Dameer's response begins to plateau. He understands and agrees with this plan. He knows the goal of treatment in his case is control. He has been encouraged to call with any issues that might arise before his next visit here.   HeaGenelle Gather  Boelter, NP   02/26/2015 2:44 PM

## 2015-02-26 NOTE — Patient Instructions (Addendum)
Bortezomib injection Qu es este medicamento? El BORTEZOMIB es un medicamento que acta sobre una protena en las clulas cancerosas y detiene su crecimiento. [place] para tratar el mieloma mltiple y el linfoma de clulas del [place]. Este medicamento puede ser utilizado para otros usos; si tiene alguna pregunta consulte con su proveedor de atencin mdica o con su farmacutico. Qu le debo informar a mi profesional de la salud antes de tomar este medicamento? Necesita saber si usted presenta alguno de los [employer] o situaciones: -diabetes -enfermedad cardiaca -latidos cardacos irregulares -enfermedad heptica -en hemodialisis -bajos conteos sanguneos, incluyendo bajos conteos de glbulos blancos, plaquetas o hemoglobina -neuropata perifrica -si toma medicamentos para la presin sangunea -una reaccin alrgica o inusual al bortezomib, al manitol, al boro, a otros medicamentos, alimentos, colorantes o conservantes -si est embarazada o buscando quedar embarazada -si est amamantando a un beb Cmo debo utilizar este medicamento? Este medicamento se administra mediante inyeccin por va intravenosa o inyeccin por debajo de la piel. Lo administra un profesional de la salud calificado en un hospital o en un entorno clnico. Hable con su pediatra para informarse acerca del uso de este medicamento en nios. Puede requerir atencin especial. Sobredosis: Pngase en contacto inmediatamente con un centro toxicolgico o una sala de urgencia si usted cree que haya tomado demasiado medicamento. ATENCIN: [employer] es solo para usted. No comparta este medicamento con nadie. Qu sucede si me olvido de una dosis? Es importante no olvidar ninguna dosis. Informe a su mdico o a su profesional de la salud si no puede asistir a Photographer. Qu puede interactuar con este medicamento? Esta medicina puede interactuar con los siguientes  medicamentos: -quetoprofeno -rifampicina -ritonavir -hierba de [place] Puede ser que esta lista no menciona todas las posibles interacciones. Informe a su profesional de [employer] Home	[place] de [employer] productos a base de hierbas, medicamentos de [place] o suplementos nutritivos que est tomando. Si usted fuma, consume bebidas alcohlicas o si utiliza drogas ilegales, indqueselo tambin a su profesional de KB Home	[place]. Algunas sustancias pueden interactuar con su medicamento. A qu debo estar atento al usar Coca[employer]Cola? Visite a su mdico para chequear su evolucin peridicamente. Este medicamento puede hacerle sentir un Nurse, mental health. Esto es normal ya que la quimioterapia afecta tanto a las clulas sanas como a las clulas cancerosas. Si presenta alguno de los [employer], infrmelos. Sin embargo, contine con el tratamiento aun si se siente enfermo, a menos que su mdico le indique que lo suspenda. Puede experimentar mareos o somnolencia. No conduzca ni utilice maquinaria ni haga nada que Associate Professor en estado de alerta hasta que sepa cmo le afecta este medicamento. No se siente ni se ponga de pie con rapidez, especialmente si es un paciente de edad avanzada. Esto reduce el riesgo de mareos o [employer]. En algunos casos, podr recibir [employer] para ayudarle con los efectos secundarios. Siga las instrucciones para usarlos. Consulte a su mdico o a su profesional de la salud por asesoramiento si tiene fiebre, escalofros, dolor de garganta o cualquier otro sntoma de resfro o gripe. No se trate usted mismo. Este medicamento puede reducir la capacidad del cuerpo para combatir infecciones. Trate de no acercarse a personas que estn enfermas. [employer] puede aumentar el riesgo de magulladuras o sangrado. Consulte a su mdico o a su profesional de la salud si observa sangrados inusuales. Usted podr Runner, broadcasting/film/video de sangre mientras est usando este  medicamento. En algunos pacientes, este medicamento puede causar  una infeccin cerebral grave que puede causar muerte. Si usted tiene [employer], pensar, Electrical engineer, caminar o estar de pie, informe a su mdico inmediatamente. Si no puede comunicarse con su mdico, buscar urgentemente otra fuente de asistencia mdica. No se debe quedar embarazada mientras recibe este medicamento. Las mujeres deben informar a su mdico si estn buscando quedar embarazadas o si creen que estn embarazadas. Existe la posibilidad de efectos secundarios graves a un beb sin nacer. Para ms informacin hable con su profesional de la salud o su farmacutico. No debe Economist a un beb mientras est usando este medicamento. Consulte con su mdico o profesional de la salud si tiene un ataque de diarrea severa, nuseas y vmitos, o si suda mucho. La prdida de demasiado lquido del cuerpo puede hacerlo peligroso para que usted toma [employer]a. Qu efectos secundarios puedo tener al [employer] este medicamento? Efectos secundarios que debe informar a su mdico o a Barrister's clerk de la salud tan pronto como sea posible: -Chief of Staff como erupcin cutnea, picazn o urticarias, hinchazn de la cara, labios o lengua -problemas respiratorios -cambios de audicin -cambios en la visin -pulso cardiaco rpido, irregular -sensacin de desmayos o aturdimiento, cadas -dolor, hormigueo o entumecimiento de manos o pies -dolor en la regin abdominal superior derecha -convulsiones -hinchazn de los tobillos, pies, manos -sangrado o magulladuras inusuales -cansancio o debilidad inusual -vmito -color amarillento de los ojos o la piel Efectos secundarios que, por lo general, no requieren atencin mdica (debe informarlos a su mdico o a su profesional de la salud si persisten o si son molestos): -cambios de emociones o de humor -estreimiento -diarrea -prdida del apetito -dolor de cabeza -Actor de  la inyeccin -nuseas Puede ser que esta lista no menciona todos los posibles efectos secundarios. Comunquese a su mdico por asesoramiento mdico [employer]. Usted puede informar los efectos secundarios a la FDA por telfono al 1-800-FDA-1088. Dnde debo guardar mi medicina? Este medicamento se administra en hospitales o clnicas y no necesitar guardarlo en su domicilio. ATENCIN: Este folleto es un resumen. Puede ser que no cubra toda la posible informacin. Si usted tiene preguntas acerca de esta medicina, consulte con su mdico, su farmacutico o su profesional de Technical sales engineer.    2016, Elsevier/Gold Standard. (2014-05-30 00:00:00)   Denosumab injection Qu es este medicamento? El DENOSUMAB desacelera la descomposicin de los [place]. Prolia se utiliza para tratar la osteoporosis en hombres y en mujeres despus de la menopausia. Del[place] See se [place] para prevenir fracturas de huesos y otros problemas de huesos provocados por la metstasis en el cncer de huesos. Delton See se utiliza tambin para tratar tumor de clulas gigantes del hueso. Este medicamento puede ser utilizado para otros usos; si tiene alguna pregunta consulte con su proveedor de atencin mdica o con su farmacutico. Qu le debo informar a mi profesional de la salud antes de tomar este medicamento? Necesita saber si usted presenta alguno de los siguientes problemas o situaciones: enfermedad dental eccema infeccin o antecedentes de infecciones enfermedad renal o si est bajo tratamiento de dilisis bajo nivel de calcio o vitamina D en la sangre sndrome de malabsorcin una ciruga programada o extracciones dentales si toma medicamentos que contienen denosumab enfermedad tiroidea o de las paratiroides una reaccin alrgica o inusual al denosumab, a otros medicamentos, alimentos, colorantes o conservantes si est embarazada o buscando quedar embarazada si est amamantando a un beb Cmo debo utilizar este  medicamento? Este medicamento se administra mediante inyeccin por va subcutnea.  Lo administra un profesional de Technical sales engineer en un hospital o en un entorno clnico. Si recibe Prolia, su farmacutico le dar una [place] del medicamento especial con cada receta y relleno. Asegrese de leer esta informacin cada vez cuidadosamente. Para Prolia, consulte con su pediatra para informarse acerca del uso de este medicamento en nios. Puede requerir atencin especial. Jacklyn Shell, consulte con su pediatra para informase acerca del uso de este medicamento en nios. Aunque este medicamento se puede recetar a nios tan menores como de 13 aos de edad para condiciones selectivas, las precauciones se aplican. Sobredosis: Pngase en contacto inmediatamente con un centro toxicolgico o una sala de urgencia si usted cree que haya tomado demasiado medicamento. ATENCIN: [employer] es solo para usted. No comparta este medicamento con nadie. Qu sucede si me olvido de una dosis? Es importante no olvidar ninguna dosis. Informe a su mdico o a su profesional de la salud si no puede asistir a Photographer. Qu puede interactuar con este medicamento? No tome esta medicina con ninguno de los siguientes medicamentos: otros medicamentos que contienen denosumab Esta medicina tambin puede Counselling psychologist con los siguientes medicamentos: medicamentos que suprimen el sistema inmunolgico medicamentos para tratar Risk analyst esteroideos, como la prednisona o la cortisona Puede ser que esta lista no menciona todas las posibles interacciones. Informe a su profesional de KB Home	[place] de [employer] productos a base de hierbas, medicamentos de [place] o suplementos nutritivos que est tomando. Si usted fuma, consume bebidas alcohlicas o si utiliza drogas ilegales, indqueselo tambin a su profesional de KB Home	[place]. Algunas sustancias pueden interactuar con su medicamento. A qu debo estar atento al usar Coca-Cola? Visite a  su mdico o a su profesional de la salud para chequeos peridicos. Su mdico o su profesional de la salud puede pedirle anlisis de [place] u otras pruebas para Chief Technology Officer su evolucin. Si desarrolla un resfro u otra infeccin mientras est recibiendo Coca-Cola, consulte a su mdico o su profesional de Technical sales engineer. No se trate usted mismo. Este medicamento puede reducir la capacidad del cuerpo para combatir infecciones. Debe asegurarse de que su dieta incluya la cantidad necesaria de calcio y vitamina D mientras est tomando este medicamento, a menos que su mdico indique lo contrario. Hable con su profesional de la salud acerca de sus alimentos y las vitaminas que est tomando. Visite a su dentista habitualmente. [place] sus dientes o use hilo dental para los dientes como indicado. Antes de someterse a Academic librarian, informe a su dentista que est [employer]. No se debe quedar embarazada mientras est tomando este medicamento o por 5 meses despus de terminarlo. Las mujeres deben informar a su mdico si estn buscando quedar embarazadas o si creen que estn embarazadas. Existe la posibilidad de efectos secundarios graves a un beb sin nacer. Para ms informacin hable con su profesional de la salud o su farmacutico. Qu efectos secundarios puedo tener al utilizar este medicamento? Efectos secundarios que debe informar a su mdico o a Barrister's clerk de la salud tan pronto como sea posible: Chief of Staff como erupcin cutnea, picazn o urticarias, hinchazn de la cara, labios o lengua problemas respiratorios dolor en el pecho pulso cardiaco rpido, irregular sensacin de desmayos o mareos, cadas fiebre, escalofros o cualquier otro signo de infeccin espasmos o rigidez de los msculos hormigueo o entumecimiento formacin de bultos o ampollas, o piel seca, roja o descamacin de la piel cicatrizacin lenta o dolor de mandbula o boca sin explicacin sangrado o  magulladuras  inusuales Efectos secundarios que, por lo general, no requieren atencin mdica (debe informarlos a su mdico o a su profesional de la salud si persisten o si son molestos): Geneticist, molecular o gases estomacales Puede ser que esta lista no menciona todos los posibles efectos secundarios. Comunquese a su mdico por asesoramiento mdico [employer]. Usted puede informar los efectos secundarios a la FDA por telfono al 1-800-FDA-1088. Dnde debo guardar mi medicina? Este medicamento se administra en clnicas, consultorio del mdico u otro establecimiento de atencin mdica y no necesitar guardarlo en su domicilio. ATENCIN: Este folleto es un resumen. Puede ser que no cubra toda la posible informacin. Si usted tiene preguntas acerca de esta medicina, consulte con su mdico, su farmacutico o su profesional de Technical sales engineer.    2016, Elsevier/Gold Standard. (2014-03-12 00:00:00)

## 2015-03-05 ENCOUNTER — Ambulatory Visit (HOSPITAL_BASED_OUTPATIENT_CLINIC_OR_DEPARTMENT_OTHER): Payer: Self-pay | Admitting: Oncology

## 2015-03-05 ENCOUNTER — Telehealth: Payer: Self-pay | Admitting: Oncology

## 2015-03-05 ENCOUNTER — Ambulatory Visit: Payer: Self-pay

## 2015-03-05 ENCOUNTER — Other Ambulatory Visit (HOSPITAL_BASED_OUTPATIENT_CLINIC_OR_DEPARTMENT_OTHER): Payer: Self-pay

## 2015-03-05 ENCOUNTER — Ambulatory Visit (HOSPITAL_BASED_OUTPATIENT_CLINIC_OR_DEPARTMENT_OTHER): Payer: Self-pay

## 2015-03-05 VITALS — BP 145/71 | HR 94 | Temp 98.1°F | Resp 18 | Ht 63.0 in | Wt 155.5 lb

## 2015-03-05 DIAGNOSIS — Z5112 Encounter for antineoplastic immunotherapy: Secondary | ICD-10-CM

## 2015-03-05 DIAGNOSIS — N183 Chronic kidney disease, stage 3 (moderate): Secondary | ICD-10-CM

## 2015-03-05 DIAGNOSIS — C9002 Multiple myeloma in relapse: Secondary | ICD-10-CM

## 2015-03-05 DIAGNOSIS — M899 Disorder of bone, unspecified: Secondary | ICD-10-CM

## 2015-03-05 DIAGNOSIS — G4700 Insomnia, unspecified: Secondary | ICD-10-CM

## 2015-03-05 DIAGNOSIS — C9000 Multiple myeloma not having achieved remission: Secondary | ICD-10-CM

## 2015-03-05 LAB — CBC WITH DIFFERENTIAL/PLATELET
BASO%: 0 % (ref 0.0–2.0)
BASOS ABS: 0 10*3/uL (ref 0.0–0.1)
EOS ABS: 0 10*3/uL (ref 0.0–0.5)
EOS%: 0 % (ref 0.0–7.0)
HCT: 28.2 % — ABNORMAL LOW (ref 38.4–49.9)
HEMOGLOBIN: 9.2 g/dL — AB (ref 13.0–17.1)
LYMPH#: 2.5 10*3/uL (ref 0.9–3.3)
LYMPH%: 30.2 % (ref 14.0–49.0)
MCH: 31.1 pg (ref 27.2–33.4)
MCHC: 32.6 g/dL (ref 32.0–36.0)
MCV: 95.3 fL (ref 79.3–98.0)
MONO#: 0.9 10*3/uL (ref 0.1–0.9)
MONO%: 10.5 % (ref 0.0–14.0)
NEUT%: 59.3 % (ref 39.0–75.0)
NEUTROS ABS: 5 10*3/uL (ref 1.5–6.5)
NRBC: 0 % (ref 0–0)
PLATELETS: 405 10*3/uL — AB (ref 140–400)
RBC: 2.96 10*6/uL — AB (ref 4.20–5.82)
RDW: 17 % — AB (ref 11.0–14.6)
WBC: 8.4 10*3/uL (ref 4.0–10.3)

## 2015-03-05 LAB — COMPREHENSIVE METABOLIC PANEL
ALBUMIN: 3 g/dL — AB (ref 3.5–5.0)
ALK PHOS: 115 U/L (ref 40–150)
ALT: 14 U/L (ref 0–55)
ANION GAP: 13 meq/L — AB (ref 3–11)
AST: 15 U/L (ref 5–34)
BUN: 32.8 mg/dL — ABNORMAL HIGH (ref 7.0–26.0)
CO2: 15 meq/L — AB (ref 22–29)
CREATININE: 2.1 mg/dL — AB (ref 0.7–1.3)
Calcium: 8.2 mg/dL — ABNORMAL LOW (ref 8.4–10.4)
Chloride: 114 mEq/L — ABNORMAL HIGH (ref 98–109)
EGFR: 39 mL/min/{1.73_m2} — ABNORMAL LOW (ref >90)
GLUCOSE: 106 mg/dL (ref 70–140)
Potassium: 3.7 mEq/L (ref 3.5–5.1)
Sodium: 142 mEq/L (ref 136–145)
TOTAL PROTEIN: 6.7 g/dL (ref 6.4–8.3)

## 2015-03-05 MED ORDER — ONDANSETRON HCL 8 MG PO TABS
ORAL_TABLET | ORAL | Status: AC
  Filled 2015-03-05: qty 1

## 2015-03-05 MED ORDER — PANTOPRAZOLE SODIUM 20 MG PO TBEC: 20.0000 mg | DELAYED_RELEASE_TABLET | Freq: Two times a day (BID) | ORAL | Status: DC

## 2015-03-05 MED ORDER — DEXAMETHASONE 4 MG PO TABS: ORAL_TABLET | ORAL | Status: DC

## 2015-03-05 MED ORDER — ONDANSETRON HCL 8 MG PO TABS: 8.0000 mg | ORAL_TABLET | Freq: Three times a day (TID) | ORAL | Status: DC | PRN

## 2015-03-05 MED ORDER — ONDANSETRON HCL 8 MG PO TABS
8.0000 mg | ORAL_TABLET | Freq: Once | ORAL | Status: AC
  Administered 2015-03-05: 8 mg via ORAL

## 2015-03-05 MED ORDER — BORTEZOMIB CHEMO SQ INJECTION 3.5 MG (2.5MG/ML)
1.3000 mg/m2 | Freq: Once | INTRAMUSCULAR | Status: AC
  Administered 2015-03-05: 2.25 mg via SUBCUTANEOUS
  Filled 2015-03-05: qty 2.25

## 2015-03-05 MED FILL — DEXAMETHASONE 4 MG TABLET: 4 | 35 days supply | Qty: 50 | Fill #0

## 2015-03-05 MED FILL — ONDANSETRON HCL 8 MG TABLET: 8 | 6 days supply | Qty: 20 | Fill #0

## 2015-03-05 MED FILL — PANTOPRAZOLE SOD DR 20 MG T: 20 | 30 days supply | Qty: 60 | Fill #0

## 2015-03-05 NOTE — Progress Notes (Signed)
Charles [name]  [phone] [phone]    ID: [name] DOB: Mar 11, 1975  MR#: [id number]  YYF#:[id number]  Patient Care Team: Tresa Garter, MD as PCP - General (Internal Medicine) [name], NP as Nurse Practitioner (Hematology and Oncology) [name], MD as Consulting Physician (Oncology) PCP: [name], MD GYN: SU:  OTHER MD:  CHIEF COMPLAINT: multiple myeloma  CURRENT TREATMENT:  Bortezomib, dexamethasone, Denosumab/Xgeva  HISTORY  OF MULTIPLE MYELOMA: From the original intake note January 2016:  Mr. Alphia [name] ([name] is not his first name; 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 The pain was worse with coughing. Exam was unremarkable, and he was treated with Mobic. No labs were obtained. On 01/30/2014 he presented to the emergency room at [facility] with similar complaints and also reported a 10 pound weight loss over the past month. A chest x-ray was significant only for mild atelectasis, but ACT NGO of the chest 01/30/2014, while it showed no pulmonary emboli, showed an osteolytic lesion destroying the manubrium of the sternum. There was extra osseous extension into the anterior mediastinum.  The patient was admitted and found to have a creatinine of 1.64 with a GFR of 51. Bilateral renal ultrasound showed no hydronephrosis or masses, and normal size and cortical thickness of both kidneys, though there was mild diffuse echogenicity. Urine total protein was 145 mg/dL and immunofixation showed a monoclonal IgG kappa protein as well as monoclonal free kappa light chains. On 01/31/2014 serum protein electrophoresis showed an M spike of 2.22 g/dL with a total protein of 8.3 and albumin 3.4. Kappa lambda light chains from the serum obtained 02/04/2014 showed 10.50 mg/dL on the, 1.74 in the lambda, with an elevated ratio  at 6.03. Beta-2 microglobulin was 7.36. LDH was normal at 184 and HIV antibody was nonreactive.  Biopsy of the manubrial mass 02/03/2014 showed extensive infiltration of the bone by sheets of atypical plasma cells, positive for CD138, kappa restricted (SZA 16-23). Right iliac bone marrow biopsy [name] 05/21/2014 (FZB 16-1) showed an average of 12% plasma cells with numerous aggregates and sheets of atypical plasma cells, which were kappa restricted. Cytogenetic analysis is pending  The patient's subsequent history is as detailed below  INTERVAL HISTORY: Charles [name] returns today for follow-up of his relapsed multiple myeloma. Today is day 1 cycle 5 of his weekly bortezomib. He also receives oral dexamethasone every Tuesday and Wednesday, 20 mg daily.  REVIEW OF SYSTEMS: [name] still has significant insomnia from the dexamethasone. He takes Benadryl and lorazepam and that does help some. He has not had any swelling, gastritis, or thrush. He is taking Diflucan pretty much every day and he also takes prophylactic Septra and acyclovir. He feels a little tired for a day or 2 after each Velcade dose. He is not able to work, but he tries to do a few things around the house keep himself busy. A detailed review of systems today was otherwise noncontributory  PAST MEDICAL HISTORY: Past Medical History  Diagnosis Date  . IgG myeloma ([place])   . CKD (chronic kidney disease), stage III   . Hypercholesterolemia   . Lytic bone lesions on xray     PAST SURGICAL HISTORY: No past surgical history on file.  FAMILY HISTORY Family History  Problem Relation Age of Onset  . Diabetes Father    The patient's parents are still living, in their late  70's. The patient has 2 brothers, 3 sisters. There is noc cancer in the fmaily to his knowledge.  SOCIAL HISTORY:  Originally from [facility], moved here 19 years ago. He works in Nurse, adult. Married (wife's name is Charles [name]), lives in [place]. He has  5 children ages 79, 86, 72, 54 and 44 months [name], [name], [name] and [name]) in good health. Best number to call him is [phone]    ADVANCED DIRECTIVES: not  In place   HEALTH MAINTENANCE: Social History  Substance Use Topics  . Smoking status: Never Smoker   . Smokeless tobacco: Never Used  . Alcohol Use: No     Colonoscopy:  PSA:  Bone density:  Lipid panel:  Allergies  Allergen Reactions  . Ciprofloxacin Rash  . Mobic [Meloxicam] Rash    Current Outpatient Prescriptions  Medication Sig Dispense Refill  . fluconazole (DIFLUCAN) 100 MG tablet Take 1 tablet (100 mg total) by mouth daily. 10 tablet 0  . acyclovir (ZOVIRAX) 200 MG capsule Take 2 capsules (400 mg total) by mouth daily. 60 capsule 1  . dexamethasone (DECADRON) 4 MG tablet Tome 5 pastillas al desayuno cada martes y miercoles (take 5 tablets with breakfast every Tues and Wed) 50 tablet 12  . diphenhydrAMINE (SOMINEX) 25 MG tablet Take 25 mg by mouth at bedtime as needed for allergies. Reported on 02/12/2015    . LORazepam (ATIVAN) 0.5 MG tablet Take 1 tablet (0.5 mg total) by mouth at bedtime as needed for anxiety. 30 tablet 0  . ondansetron (ZOFRAN) 8 MG tablet Take 1 tablet (8 mg total) by mouth every 8 (eight) hours as needed for nausea or vomiting. 20 tablet 1  . pantoprazole (PROTONIX) 20 MG tablet Take 1 tablet (20 mg total) by mouth 2 (two) times daily. 60 tablet 3  . sulfamethoxazole-trimethoprim (BACTRIM DS,SEPTRA DS) 800-160 MG tablet Take 1 tablet by mouth 2 (two) times daily. 60 tablet 1  . traMADol (ULTRAM) 50 MG tablet Take 1 tablet (50 mg total) by mouth every 6 (six) hours as needed. 60 tablet 0   No current facility-administered medications for this visit.    OBJECTIVE: [name] man in no acute distress Filed Vitals:   03/05/15 1340  BP: 145/71  Pulse: 94  Temp: 98.1 F (36.7 C)  Resp: 18     Body mass index is 27.55 kg/(m^2).    ECOG FS:1 - Symptomatic but completely  ambulatory   Weight is back to baseline Sclerae unicteric, pupils round and equal Oropharynx clear and moist-- no thrush or other lesions No cervical or supraclavicular adenopathy Lungs no rales or rhonchi Heart regular rate and rhythm Abd soft, nontender, positive bowel sounds MSK no focal spinal tenderness, no upper extremity lymphedema Neuro: nonfocal, well oriented, appropriate affect   LAB RESULTS:   Ref Range 7d ago    IgG, Qn, Serum 700 - 1600 mg/dL 701   IgA, Qn, Serum 90 - 386 mg/dL 82 (L)   IgM, Qn, Serum 20 - 172 mg/dL 43   Total Protein 6.0 - 8.5 g/dL 5.6 (L)   Albumin SerPl Elph-Mcnc 2.9 - 4.4 g/dL 2.3 (L)   Alpha 1 0.0 - 0.4 g/dL 0.4   Alpha2 Glob SerPl Elph-Mcnc 0.4 - 1.0 g/dL 1.3 (H)   B-Globulin SerPl Elph-Mcnc 0.7 - 1.3 g/dL 0.8   Gamma Glob SerPl Elph-Mcnc 0.4 - 1.8 g/dL 0.7   M Protein SerPl Elph-Mcnc Not Observed g/dL 0.5 (H)   Globulin, Total 2.2 - 3.9  g/dL 3.3   Albumin/Glob SerPl 0.7 - 1.7  0.7   IFE 1  Comment   Comments: Immunofixation shows IgG monoclonal protein with kappa light chain  specificity.     Please Note  Comment   Comments: Protein electrophoresis scan will follow via computer, mail, or  courier delivery.     Resulting Agency [facility]     Narrative     Testing performed at: [BN] [facility], [address] [place], [place], [address], Phone: [phone], Laboratory Director: Lindon Romp, MD       Specimen Collected: 02/12/15 2:33 PM Last Resulted: 02/16/15 2:38 PM                     CMP     Component Value Date/Time   NA 140 02/26/2015 1328   NA 144 01/27/2015 0605   K 4.0 02/26/2015 1328   K 4.8 01/27/2015 0605   CL 117* 01/27/2015 0605   CO2 17* 02/26/2015 1328   CO2 18* 01/27/2015 0605   GLUCOSE 88 02/26/2015 1328   GLUCOSE 116* 01/27/2015 0605   BUN 28.0* 02/26/2015 1328   BUN 64* 01/27/2015 0605   CREATININE 1.5* 02/26/2015 1328   CREATININE 3.03* 01/27/2015 0605    CALCIUM 8.6 02/26/2015 1328   CALCIUM 6.3* 01/27/2015 0605   PROT 6.5 02/26/2015 1328   PROT 5.6* 02/12/2015 1433   PROT 4.8* 01/25/2015 0740   ALBUMIN 2.7* 02/26/2015 1328   ALBUMIN 1.6* 01/27/2015 0605   AST 15 02/26/2015 1328   AST 18 01/25/2015 0740   ALT 13 02/26/2015 1328   ALT 32 01/25/2015 0740   ALKPHOS 128 02/26/2015 1328   ALKPHOS 107 01/25/2015 0740   BILITOT <0.30 02/26/2015 1328   BILITOT 0.1* 01/25/2015 0740   GFRNONAA 24* 01/27/2015 0605   GFRAA 28* 01/27/2015 0605    INo results found for: SPEP, UPEP  Lab Results  Component Value Date   WBC 8.4 03/05/2015   NEUTROABS 5.0 03/05/2015   HGB 9.2* 03/05/2015   HCT 28.2* 03/05/2015   MCV 95.3 03/05/2015   PLT 405* 03/05/2015      Chemistry      Component Value Date/Time   NA 140 02/26/2015 1328   NA 144 01/27/2015 0605   K 4.0 02/26/2015 1328   K 4.8 01/27/2015 0605   CL 117* 01/27/2015 0605   CO2 17* 02/26/2015 1328   CO2 18* 01/27/2015 0605   BUN 28.0* 02/26/2015 1328   BUN 64* 01/27/2015 0605   CREATININE 1.5* 02/26/2015 1328   CREATININE 3.03* 01/27/2015 0605      Component Value Date/Time   CALCIUM 8.6 02/26/2015 1328   CALCIUM 6.3* 01/27/2015 0605   ALKPHOS 128 02/26/2015 1328   ALKPHOS 107 01/25/2015 0740   AST 15 02/26/2015 1328   AST 18 01/25/2015 0740   ALT 13 02/26/2015 1328   ALT 32 01/25/2015 0740   BILITOT <0.30 02/26/2015 1328   BILITOT 0.1* 01/25/2015 0740       No results found for: LABCA2  No components found for: LABCA125  No results for input(s): INR in the last 168 hours.  Urinalysis    Component Value Date/Time   COLORURINE YELLOW 01/26/2015 0742   APPEARANCEUR CLOUDY* 01/26/2015 0742   LABSPEC 1.014 01/26/2015 0742   LABSPEC 1.030 01/22/2015 1018   PHURINE 5.0 01/26/2015 0742   PHURINE 5.0 01/22/2015 1018   GLUCOSEU NEGATIVE 01/26/2015 0742   GLUCOSEU Negative 01/22/2015 1018   HGBUR LARGE* 01/26/2015 0742  HGBUR Large 01/22/2015 1018   [place] 01/26/2015 0742   BILIRUBINUR Negative 01/22/2015 [place] 01/26/2015 0742   KETONESUR Negative 01/22/2015 1018   PROTEINUR 30* 01/26/2015 0742   PROTEINUR 2000 01/22/2015 1018   UROBILINOGEN 0.2 01/22/2015 1018   UROBILINOGEN 0.2 01/31/2014 1050   NITRITE NEGATIVE 01/26/2015 0742   NITRITE Negative 01/22/2015 1018   LEUKOCYTESUR NEGATIVE 01/26/2015 0742   LEUKOCYTESUR Color Interference due to blood in urine 01/22/2015 1018    STUDIES: No results found.  ASSESSMENT: 40 y.o. 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;        I: CyBorD started January 2016  (a) dexamethasone 20 mg/d every TU/WED started 02/04/2014  (b) bortezomib sQ days 1,4,8,11 of every 21 day cycle started 02/10/2014  (c) cyclophosphamide 300 mg/M2 IV weekly started 02/13/2014       2: Lenalidomide 61m daily started 07/26/2014  (2) chronic kidney disease stage III  (3) hypercalcemia: zolendronate started 02/10/2014-- repeat every 4 weeks initially, then every 12 weeks  (a) changed to denosumab/Xgeva starting 02/26/2015 because of CKD  (4) ID prophylaxis:  (a) influenza and Pneumovax [PV13] vaccines 02/07/2014  (b) acyclovir started 02/07/2014  (c) HIV negative 01/31/2014  (d) Pneumovax P23 too be given after March 2016  (5). The patient is not a transplant candidate for financial reasons  (6) bilateral foot rash: Started on Septra DS and Diflucan 09/03/2014, to be continued for 10 days.  (7) Multiple Myeloma relapse in December 2016.   (a)  starting 02/05/15: dexamethasone 236mx2 consecutive days weekly; bortezomib sQ weekly,   (b) Septra DS twice daily  (c) acyclovir 40048maily    PLAN: William is clinically much improved and is tolerating the Velcade well. He also tolerated his first dose of denosumab without any complications.  He gets insomnia from the dexamethasone which he takes on Tuesdays and Wednesdays. The diphenhydramine and lorazepam help some. He then gets treated on Thursdays and feels tired for a day or 2. Part of this may be coming off the steroids, so beginning with the treatment February 9, I have asked him to take his dexamethasone on Thursdays and Fridays weekly. I think this will take care of the fatigue issue.  His next SPEP will be febrile in ninth and we will see him a week later to discuss results. After that we will continue to check his myeloma labs on a monthly basis with visits to week later, since it takes several days to get the results. Specifically he will see me again in mid May. At that time depending on how he is doing we will consider switching him to a simpler regimen, namely cyclophosphamide every 3 weeks with continuing steroids.  Today I refilled his Protonix, dexamethasone, and ondansetron.  He knows to call for any problems that may develop before his next visit here.    MAGChauncey CruelD   03/05/2015 2:01 PM

## 2015-03-05 NOTE — Telephone Encounter (Signed)
Appointments added and avs will be printed in chemo for pt

## 2015-03-05 NOTE — Patient Instructions (Signed)
[facility] Discharge Instructions for Patients Receiving Chemotherapy  Today you received the following chemotherapy agents velcade injection.   If you develop nausea and vomiting that is not controlled by your nausea medication, call the clinic.   BELOW ARE SYMPTOMS THAT SHOULD BE REPORTED IMMEDIATELY:  *FEVER GREATER THAN 100.5 F  *CHILLS WITH OR WITHOUT FEVER  NAUSEA AND VOMITING THAT IS NOT CONTROLLED WITH YOUR NAUSEA MEDICATION  *UNUSUAL SHORTNESS OF BREATH  *UNUSUAL BRUISING OR BLEEDING  TENDERNESS IN MOUTH AND THROAT WITH OR WITHOUT PRESENCE OF ULCERS  *URINARY PROBLEMS  *BOWEL PROBLEMS  UNUSUAL RASH Items with * indicate a potential emergency and should be followed up as soon as possible.  Feel free to call the clinic you have any questions or concerns. The clinic phone number is [phone].  Please show the [facility] at check-in to the Emergency Department and triage nurse.

## 2015-03-12 ENCOUNTER — Other Ambulatory Visit (HOSPITAL_BASED_OUTPATIENT_CLINIC_OR_DEPARTMENT_OTHER): Payer: Self-pay

## 2015-03-12 ENCOUNTER — Ambulatory Visit (HOSPITAL_BASED_OUTPATIENT_CLINIC_OR_DEPARTMENT_OTHER): Payer: Self-pay | Admitting: Nurse Practitioner

## 2015-03-12 ENCOUNTER — Ambulatory Visit (HOSPITAL_BASED_OUTPATIENT_CLINIC_OR_DEPARTMENT_OTHER): Payer: Self-pay

## 2015-03-12 ENCOUNTER — Other Ambulatory Visit: Payer: Self-pay | Admitting: *Deleted

## 2015-03-12 ENCOUNTER — Encounter: Payer: Self-pay | Admitting: Nurse Practitioner

## 2015-03-12 ENCOUNTER — Ambulatory Visit: Payer: Self-pay

## 2015-03-12 VITALS — BP 127/61 | HR 82 | Temp 98.0°F | Resp 18 | Ht 63.0 in | Wt 157.6 lb

## 2015-03-12 DIAGNOSIS — M899 Disorder of bone, unspecified: Secondary | ICD-10-CM

## 2015-03-12 DIAGNOSIS — C9000 Multiple myeloma not having achieved remission: Secondary | ICD-10-CM

## 2015-03-12 DIAGNOSIS — IMO0001 Reserved for inherently not codable concepts without codable children: Secondary | ICD-10-CM

## 2015-03-12 DIAGNOSIS — K219 Gastro-esophageal reflux disease without esophagitis: Secondary | ICD-10-CM

## 2015-03-12 DIAGNOSIS — C9002 Multiple myeloma in relapse: Secondary | ICD-10-CM

## 2015-03-12 DIAGNOSIS — Z5112 Encounter for antineoplastic immunotherapy: Secondary | ICD-10-CM

## 2015-03-12 DIAGNOSIS — N183 Chronic kidney disease, stage 3 (moderate): Secondary | ICD-10-CM

## 2015-03-12 LAB — COMPREHENSIVE METABOLIC PANEL
ALBUMIN: 2.8 g/dL — AB (ref 3.5–5.0)
ALK PHOS: 94 U/L (ref 40–150)
ALT: 16 U/L (ref 0–55)
ANION GAP: 9 meq/L (ref 3–11)
AST: 19 U/L (ref 5–34)
BUN: 20.1 mg/dL (ref 7.0–26.0)
CO2: 16 meq/L — AB (ref 22–29)
CREATININE: 1.4 mg/dL — AB (ref 0.7–1.3)
Calcium: 8.8 mg/dL (ref 8.4–10.4)
Chloride: 113 mEq/L — ABNORMAL HIGH (ref 98–109)
EGFR: 63 mL/min/{1.73_m2} — AB (ref >90)
Glucose: 122 mg/dl (ref 70–140)
Potassium: 4.8 mEq/L (ref 3.5–5.1)
Sodium: 138 mEq/L (ref 136–145)
TOTAL PROTEIN: 6.8 g/dL (ref 6.4–8.3)

## 2015-03-12 LAB — CBC WITH DIFFERENTIAL/PLATELET
BASO%: 0.1 % (ref 0.0–2.0)
Basophils Absolute: 0 10*3/uL (ref 0.0–0.1)
EOS ABS: 0 10*3/uL (ref 0.0–0.5)
EOS%: 0 % (ref 0.0–7.0)
HEMATOCRIT: 28.6 % — AB (ref 38.4–49.9)
HEMOGLOBIN: 9.4 g/dL — AB (ref 13.0–17.1)
LYMPH#: 0.6 10*3/uL — AB (ref 0.9–3.3)
LYMPH%: 5.7 % — AB (ref 14.0–49.0)
MCH: 31.8 pg (ref 27.2–33.4)
MCHC: 32.9 g/dL (ref 32.0–36.0)
MCV: 96.6 fL (ref 79.3–98.0)
MONO#: 0.1 10*3/uL (ref 0.1–0.9)
MONO%: 0.9 % (ref 0.0–14.0)
NEUT%: 93.3 % — ABNORMAL HIGH (ref 39.0–75.0)
NEUTROS ABS: 9.1 10*3/uL — AB (ref 1.5–6.5)
PLATELETS: 422 10*3/uL — AB (ref 140–400)
RBC: 2.96 10*6/uL — AB (ref 4.20–5.82)
RDW: 16.3 % — AB (ref 11.0–14.6)
WBC: 9.8 10*3/uL (ref 4.0–10.3)

## 2015-03-12 MED ORDER — ONDANSETRON HCL 8 MG PO TABS
ORAL_TABLET | ORAL | Status: AC
  Filled 2015-03-12: qty 1

## 2015-03-12 MED ORDER — BORTEZOMIB CHEMO SQ INJECTION 3.5 MG (2.5MG/ML)
1.3000 mg/m2 | Freq: Once | INTRAMUSCULAR | Status: AC
  Administered 2015-03-12: 2.25 mg via SUBCUTANEOUS
  Filled 2015-03-12: qty 2.25

## 2015-03-12 MED ORDER — ONDANSETRON HCL 8 MG PO TABS
8.0000 mg | ORAL_TABLET | Freq: Once | ORAL | Status: AC
  Administered 2015-03-12: 8 mg via ORAL

## 2015-03-12 MED ORDER — FLUCONAZOLE 100 MG PO TABS: 100.0000 mg | ORAL_TABLET | Freq: Every day | ORAL | Status: DC

## 2015-03-12 MED ORDER — SULFAMETHOXAZOLE-TRIMETHOPRIM 800-160 MG PO TABS: 1.0000 | ORAL_TABLET | Freq: Two times a day (BID) | ORAL | Status: DC

## 2015-03-12 MED FILL — SULFAMETHOXAZOLE/TMP DS TAB: 800-160 | 30 days supply | Qty: 60 | Fill #0

## 2015-03-12 MED FILL — FLUCONAZOLE 100 MG TABLET: 100 | 10 days supply | Qty: 10 | Fill #0

## 2015-03-12 NOTE — Patient Instructions (Signed)
[facility] Discharge Instructions for Patients Receiving Chemotherapy  Today you received the following chemotherapy agents Velcade. To help prevent nausea and vomiting after your treatment, we encourage you to take your nausea medication as directed.  If you develop nausea and vomiting that is not controlled by your nausea medication, call the clinic.   BELOW ARE SYMPTOMS THAT SHOULD BE REPORTED IMMEDIATELY:  *FEVER GREATER THAN 100.5 F  *CHILLS WITH OR WITHOUT FEVER  NAUSEA AND VOMITING THAT IS NOT CONTROLLED WITH YOUR NAUSEA MEDICATION  *UNUSUAL SHORTNESS OF BREATH  *UNUSUAL BRUISING OR BLEEDING  TENDERNESS IN MOUTH AND THROAT WITH OR WITHOUT PRESENCE OF ULCERS  *URINARY PROBLEMS  *BOWEL PROBLEMS  UNUSUAL RASH Items with * indicate a potential emergency and should be followed up as soon as possible.  Feel free to call the clinic you have any questions or concerns. The clinic phone number is [phone].  Please show the CHEMO ALERT CARD at check-in to the Emergency Department and triage nurse.    

## 2015-03-12 NOTE — Progress Notes (Signed)
Charles [name]  [phone] [phone]    ID: Charles [name] DOB: [birth year]  MR#: [id number]  ZYY#:[id number]  Patient Care Team: Tresa Garter, MD as PCP - General (Internal Medicine) [name], NP as Nurse Practitioner (Hematology and Oncology) [name], MD as Consulting Physician (Oncology) PCP: [name], MD GYN: SU:  OTHER MD:  CHIEF COMPLAINT: multiple myeloma  CURRENT TREATMENT:  Bortezomib, dexamethasone, Denosumab/Xgeva  HISTORY  OF MULTIPLE MYELOMA: From the original intake note January 2016:  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 The pain was worse with coughing. Exam was unremarkable, and he was treated with Mobic. No labs were obtained. On 01/30/2014 he presented to the emergency room at [facility] with similar complaints and also reported a 10 pound weight loss over the past month. A chest x-ray was significant only for mild atelectasis, but ACT NGO of the chest 01/30/2014, while it showed no pulmonary emboli, showed an osteolytic lesion destroying the manubrium of the sternum. There was extra osseous extension into the anterior mediastinum.  The patient was admitted and found to have a creatinine of 1.64 with a GFR of 51. Bilateral renal ultrasound showed no hydronephrosis or masses, and normal size and cortical thickness of both kidneys, though there was mild diffuse echogenicity. Urine total protein was 145 mg/dL and immunofixation showed a monoclonal IgG kappa protein as well as monoclonal free kappa light chains. On 01/31/2014 serum protein electrophoresis showed an M spike of 2.22 g/dL with a total protein of 8.3 and albumin 3.4. Kappa lambda light chains from the serum obtained 02/04/2014 showed 10.50 mg/dL on the, 1.74 in the lambda, with an elevated ratio  at 6.03. Beta-2 microglobulin was 7.36. LDH was normal at 184 and HIV antibody was nonreactive.  Biopsy of the manubrial mass 02/03/2014 showed extensive infiltration of the bone by sheets of atypical plasma cells, positive for CD138, kappa restricted (SZA 16-23). Right iliac bone marrow biopsy [name] 05/21/2014 (FZB 16-1) showed an average of 12% plasma cells with numerous aggregates and sheets of atypical plasma cells, which were kappa restricted. Cytogenetic analysis is pending  The patient's subsequent history is as detailed below  INTERVAL HISTORY: [name] returns today for follow-up of his relapsed multiple myeloma. Today is day 1, cycle 6 of his weekly bortezomib. He also receives 65m oral dexamethasone twice week, now being taken on every Thursday and Friday.   REVIEW OF SYSTEMS: Tyheim denies fevers or chills. He is nauseous almost randomly, but zofran is helpful. He has some back pain, and takes tramadol for this. He continues on diflucan, septra, and acyclovir prophylactically daily. He denies mouth sores or rashes. He is urinating well. His stools are firm but not hard. He does not sleep well and uses lorazepam PRN. He has occasional headaches. He has had heartburn a few times this past week. A detailed review of systems is otherwise stable.  PAST MEDICAL HISTORY: Past Medical History  Diagnosis Date  . IgG myeloma ([place]   . CKD (chronic kidney disease), stage III   . Hypercholesterolemia   . Lytic bone lesions on xray     PAST SURGICAL HISTORY: No past surgical history on file.  FAMILY HISTORY Family History  Problem Relation Age of Onset  . Diabetes Father    The patient's parents are still living, in their late  70's. The patient has 2 brothers, 3 sisters. There is noc cancer in the fmaily to his knowledge.  SOCIAL HISTORY:  Originally from [facility], moved here 19 years ago. He works in Nurse, adult. Married (wife's name is [name]), lives in  [place]. He has 5 children ages 41, 32, 15, 43 and 28 months [name], [name], [name] and [place]) in good health. Best number to call him is [phone]    ADVANCED DIRECTIVES: not  In place   HEALTH MAINTENANCE: Social History  Substance Use Topics  . Smoking status: Never Smoker   . Smokeless tobacco: Never Used  . Alcohol Use: No     Colonoscopy:  PSA:  Bone density:  Lipid panel:  Allergies  Allergen Reactions  . Ciprofloxacin Rash  . Mobic [Meloxicam] Rash    Current Outpatient Prescriptions  Medication Sig Dispense Refill  . acyclovir (ZOVIRAX) 200 MG capsule Take 2 capsules (400 mg total) by mouth daily. 60 capsule 1  . dexamethasone (DECADRON) 4 MG tablet Tome 5 pastillas al desayuno cada martes y miercoles (take 5 tablets with breakfast every Tues and Wed) 50 tablet 12  . diphenhydrAMINE (SOMINEX) 25 MG tablet Take 25 mg by mouth at bedtime as needed for allergies. Reported on 02/12/2015    . LORazepam (ATIVAN) 0.5 MG tablet Take 1 tablet (0.5 mg total) by mouth at bedtime as needed for anxiety. 30 tablet 0  . ondansetron (ZOFRAN) 8 MG tablet Take 1 tablet (8 mg total) by mouth every 8 (eight) hours as needed for nausea or vomiting. 20 tablet 1  . pantoprazole (PROTONIX) 20 MG tablet Take 1 tablet (20 mg total) by mouth 2 (two) times daily. 60 tablet 3  . traMADol (ULTRAM) 50 MG tablet Take 1 tablet (50 mg total) by mouth every 6 (six) hours as needed. 60 tablet 0  . fluconazole (DIFLUCAN) 100 MG tablet Take 1 tablet (100 mg total) by mouth daily. 10 tablet 0  . sulfamethoxazole-trimethoprim (BACTRIM DS,SEPTRA DS) 800-160 MG tablet Take 1 tablet by mouth 2 (two) times daily. 60 tablet 1   No current facility-administered medications for this visit.    OBJECTIVE: [name] man in no acute distress Filed Vitals:   03/12/15 1308  BP: 127/61  Pulse: 82  Temp: 98 F (36.7 C)  Resp: 18     Body mass index is 27.92 kg/(m^2).    ECOG FS:1 - Symptomatic  but completely ambulatory   Weight is back to baseline  Skin: warm, dry  HEENT: sclerae anicteric, conjunctivae pink, oropharynx clear. No thrush or mucositis.  Lymph Nodes: No cervical or supraclavicular lymphadenopathy  Lungs: clear to auscultation bilaterally, no rales, wheezes, or rhonci  Heart: regular rate and rhythm  Abdomen: round, soft, non tender, positive bowel sounds  Musculoskeletal: No focal spinal tenderness, no peripheral edema  Neuro: non focal, well oriented, positive affect    LAB RESULTS:   Ref Range 7d ago    IgG, Qn, Serum 700 - 1600 mg/dL 701   IgA, Qn, Serum 90 - 386 mg/dL 82 (L)   IgM, Qn, Serum 20 - 172 mg/dL 43   Total Protein 6.0 - 8.5 g/dL 5.6 (L)   Albumin SerPl Elph-Mcnc 2.9 - 4.4 g/dL 2.3 (L)   Alpha 1 0.0 - 0.4 g/dL 0.4   Alpha2 Glob SerPl Elph-Mcnc 0.4 - 1.0 g/dL 1.3 (H)   B-Globulin SerPl Elph-Mcnc 0.7 - 1.3 g/dL 0.8   Gamma Glob SerPl Elph-Mcnc 0.4 - 1.8 g/dL 0.7  M Protein SerPl Elph-Mcnc Not Observed g/dL 0.5 (H)   Globulin, Total 2.2 - 3.9 g/dL 3.3   Albumin/Glob SerPl 0.7 - 1.7  0.7   IFE 1  Comment   Comments: Immunofixation shows IgG monoclonal protein with kappa light chain  specificity.     Please Note  Comment   Comments: Protein electrophoresis scan will follow via computer, mail, or  courier delivery.     Resulting Agency [place]     Narrative     Testing performed at: [BN] [facility], [address] [place], [place], [address], Phone: [phone], Laboratory Director: Lindon Romp, MD       Specimen Collected: 02/12/15 2:33 PM Last Resulted: 02/16/15 2:38 PM                     CMP     Component Value Date/Time   NA 142 03/05/2015 1329   NA 144 01/27/2015 0605   K 3.7 03/05/2015 1329   K 4.8 01/27/2015 0605   CL 117* 01/27/2015 0605   CO2 15* 03/05/2015 1329   CO2 18* 01/27/2015 0605   GLUCOSE 106 03/05/2015 1329   GLUCOSE 116* 01/27/2015 0605   BUN 32.8*  03/05/2015 1329   BUN 64* 01/27/2015 0605   CREATININE 2.1* 03/05/2015 1329   CREATININE 3.03* 01/27/2015 0605   CALCIUM 8.2* 03/05/2015 1329   CALCIUM 6.3* 01/27/2015 0605   PROT 6.7 03/05/2015 1329   PROT 5.6* 02/12/2015 1433   PROT 4.8* 01/25/2015 0740   ALBUMIN 3.0* 03/05/2015 1329   ALBUMIN 1.6* 01/27/2015 0605   AST 15 03/05/2015 1329   AST 18 01/25/2015 0740   ALT 14 03/05/2015 1329   ALT 32 01/25/2015 0740   ALKPHOS 115 03/05/2015 1329   ALKPHOS 107 01/25/2015 0740   BILITOT <0.30 03/05/2015 1329   BILITOT 0.1* 01/25/2015 0740   GFRNONAA 24* 01/27/2015 0605   GFRAA 28* 01/27/2015 0605    INo results found for: SPEP, UPEP  Lab Results  Component Value Date   WBC 9.8 03/12/2015   NEUTROABS 9.1* 03/12/2015   HGB 9.4* 03/12/2015   HCT 28.6* 03/12/2015   MCV 96.6 03/12/2015   PLT 422* 03/12/2015      Chemistry      Component Value Date/Time   NA 142 03/05/2015 1329   NA 144 01/27/2015 0605   K 3.7 03/05/2015 1329   K 4.8 01/27/2015 0605   CL 117* 01/27/2015 0605   CO2 15* 03/05/2015 1329   CO2 18* 01/27/2015 0605   BUN 32.8* 03/05/2015 1329   BUN 64* 01/27/2015 0605   CREATININE 2.1* 03/05/2015 1329   CREATININE 3.03* 01/27/2015 0605      Component Value Date/Time   CALCIUM 8.2* 03/05/2015 1329   CALCIUM 6.3* 01/27/2015 0605   ALKPHOS 115 03/05/2015 1329   ALKPHOS 107 01/25/2015 0740   AST 15 03/05/2015 1329   AST 18 01/25/2015 0740   ALT 14 03/05/2015 1329   ALT 32 01/25/2015 0740   BILITOT <0.30 03/05/2015 1329   BILITOT 0.1* 01/25/2015 0740       No results found for: LABCA2  No components found for: LABCA125  No results for input(s): INR in the last 168 hours.  Urinalysis    Component Value Date/Time   COLORURINE YELLOW 01/26/2015 0742   APPEARANCEUR CLOUDY* 01/26/2015 0742   LABSPEC 1.014 01/26/2015 0742   LABSPEC 1.030 01/22/2015 1018   PHURINE 5.0 01/26/2015 0742   PHURINE 5.0 01/22/2015 1018  GLUCOSEU NEGATIVE 01/26/2015 0742    GLUCOSEU Negative 01/22/2015 1018   HGBUR LARGE* 01/26/2015 0742   HGBUR Large 01/22/2015 1018   BILIRUBINUR NEGATIVE 01/26/2015 0742   BILIRUBINUR Negative 01/22/2015 [place] 01/26/2015 0742   KETONESUR Negative 01/22/2015 1018   PROTEINUR 30* 01/26/2015 0742   PROTEINUR 2000 01/22/2015 1018   UROBILINOGEN 0.2 01/22/2015 1018   UROBILINOGEN 0.2 01/31/2014 1050   NITRITE NEGATIVE 01/26/2015 0742   NITRITE Negative 01/22/2015 1018   LEUKOCYTESUR NEGATIVE 01/26/2015 0742   LEUKOCYTESUR Color Interference due to blood in urine 01/22/2015 1018    STUDIES: No results found.  ASSESSMENT: 40 y.o. 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;        I: CyBorD started January 2016  (a) dexamethasone 20 mg/d every TU/WED started 02/04/2014  (b) bortezomib sQ days 1,4,8,11 of every 21 day cycle started 02/10/2014  (c) cyclophosphamide 300 mg/M2 IV weekly started 02/13/2014       2: Lenalidomide 57m daily started 07/26/2014  (2) chronic kidney disease stage III  (3) hypercalcemia: zolendronate started 02/10/2014-- repeat every 4 weeks initially, then every 12 weeks  (a) changed to denosumab/Xgeva starting 02/26/2015 because of CKD  (4) ID prophylaxis:  (a) influenza and Pneumovax [PV13] vaccines 02/07/2014  (b) acyclovir started 02/07/2014  (c) HIV negative 01/31/2014  (d) Pneumovax P23 too be given after March 2016  (5). The patient is not a transplant candidate for financial reasons  (6) bilateral foot rash: Started on Septra  DS and Diflucan 09/03/2014, to be continued for 10 days.  (7) Multiple Myeloma relapse in December 2016.   (a) starting 02/05/15: dexamethasone 216mx2 consecutive days weekly; bortezomib sQ weekly,   (b) Septra DS twice daily  (c) acyclovir 40072maily    PLAN: Jhoel continues to tolerate treatment well. The labs were reviewed in detail and were stable. His multiple myeloma panel is still processing. He will proceed with cycle 6 of weekly bortezomib as planned.   We discussed something for heartburn, likely caused by the high dose of dexamethasone, such as prilosec 54m108mily. He declined. If this worsens, he may be willing to start next week.   Jeray will return in 1 week for cycle 7 of bortezomib. He understands and agrees with this plan. He has been encouraged to call with any issues that might arise before his next visit here.    HeatLaurie Panda   03/12/2015 1:35 PM

## 2015-03-13 ENCOUNTER — Encounter: Payer: Self-pay | Admitting: Oncology

## 2015-03-13 LAB — KAPPA/LAMBDA LIGHT CHAINS
Ig Kappa Free Light Chain: 38.97 mg/L — ABNORMAL HIGH (ref 3.30–19.40)
Ig Lambda Free Light Chain: 6.3 mg/L (ref 5.71–26.30)
Kappa/Lambda FluidC Ratio: 6.19 — ABNORMAL HIGH (ref 0.26–1.65)

## 2015-03-13 LAB — BETA 2 MICROGLOBULIN, SERUM: Beta-2: 3.1 mg/L — ABNORMAL HIGH (ref 0.6–2.4)

## 2015-03-13 NOTE — Progress Notes (Signed)
Verified coverage with [facility], advised at this time Medicaid pending ([place]).

## 2015-03-16 LAB — MULTIPLE MYELOMA PANEL, SERUM
ALBUMIN/GLOB SERPL: 0.9 (ref 0.7–1.7)
ALPHA2 GLOB SERPL ELPH-MCNC: 1.2 g/dL — AB (ref 0.4–1.0)
Albumin SerPl Elph-Mcnc: 2.8 g/dL — ABNORMAL LOW (ref 2.9–4.4)
Alpha 1: 0.3 g/dL (ref 0.0–0.4)
B-GLOBULIN SERPL ELPH-MCNC: 0.7 g/dL (ref 0.7–1.3)
Gamma Glob SerPl Elph-Mcnc: 1.2 g/dL (ref 0.4–1.8)
Globulin, Total: 3.5 g/dL (ref 2.2–3.9)
IgA, Qn, Serum: 41 mg/dL — ABNORMAL LOW (ref 90–386)
IgG, Qn, Serum: 1209 mg/dL (ref 700–1600)
IgM, Qn, Serum: 32 mg/dL (ref 20–172)
M Protein SerPl Elph-Mcnc: 1.1 g/dL — ABNORMAL HIGH
Total Protein: 6.3 g/dL (ref 6.0–8.5)

## 2015-03-19 ENCOUNTER — Other Ambulatory Visit: Payer: Self-pay | Admitting: *Deleted

## 2015-03-19 ENCOUNTER — Encounter: Payer: Self-pay | Admitting: Nurse Practitioner

## 2015-03-19 ENCOUNTER — Other Ambulatory Visit (HOSPITAL_BASED_OUTPATIENT_CLINIC_OR_DEPARTMENT_OTHER): Payer: Self-pay

## 2015-03-19 ENCOUNTER — Other Ambulatory Visit: Payer: Self-pay | Admitting: Oncology

## 2015-03-19 ENCOUNTER — Ambulatory Visit (HOSPITAL_BASED_OUTPATIENT_CLINIC_OR_DEPARTMENT_OTHER): Payer: Self-pay

## 2015-03-19 ENCOUNTER — Ambulatory Visit: Payer: Self-pay

## 2015-03-19 ENCOUNTER — Ambulatory Visit (HOSPITAL_BASED_OUTPATIENT_CLINIC_OR_DEPARTMENT_OTHER): Payer: Self-pay | Admitting: Nurse Practitioner

## 2015-03-19 VITALS — BP 143/73 | HR 98 | Temp 98.1°F | Resp 18 | Wt 151.2 lb

## 2015-03-19 DIAGNOSIS — C9002 Multiple myeloma in relapse: Secondary | ICD-10-CM

## 2015-03-19 DIAGNOSIS — M898X9 Other specified disorders of bone, unspecified site: Secondary | ICD-10-CM

## 2015-03-19 DIAGNOSIS — N183 Chronic kidney disease, stage 3 (moderate): Secondary | ICD-10-CM

## 2015-03-19 DIAGNOSIS — Z5112 Encounter for antineoplastic immunotherapy: Secondary | ICD-10-CM

## 2015-03-19 DIAGNOSIS — M899 Disorder of bone, unspecified: Secondary | ICD-10-CM

## 2015-03-19 LAB — COMPREHENSIVE METABOLIC PANEL
ALT: 14 U/L (ref 0–55)
ANION GAP: 10 meq/L (ref 3–11)
AST: 17 U/L (ref 5–34)
Albumin: 2.9 g/dL — ABNORMAL LOW (ref 3.5–5.0)
Alkaline Phosphatase: 97 U/L (ref 40–150)
BUN: 26.2 mg/dL — AB (ref 7.0–26.0)
CALCIUM: 8.8 mg/dL (ref 8.4–10.4)
CHLORIDE: 111 meq/L — AB (ref 98–109)
CO2: 17 meq/L — AB (ref 22–29)
CREATININE: 1.9 mg/dL — AB (ref 0.7–1.3)
EGFR: 42 mL/min/{1.73_m2} — ABNORMAL LOW (ref >90)
Glucose: 190 mg/dl — ABNORMAL HIGH (ref 70–140)
Potassium: 4.8 mEq/L (ref 3.5–5.1)
Sodium: 138 mEq/L (ref 136–145)
TOTAL PROTEIN: 7.1 g/dL (ref 6.4–8.3)

## 2015-03-19 LAB — CBC WITH DIFFERENTIAL/PLATELET
BASO%: 0 % (ref 0.0–2.0)
Basophils Absolute: 0 10*3/uL (ref 0.0–0.1)
EOS%: 0 % (ref 0.0–7.0)
Eosinophils Absolute: 0 10*3/uL (ref 0.0–0.5)
HEMATOCRIT: 30.2 % — AB (ref 38.4–49.9)
HGB: 9.9 g/dL — ABNORMAL LOW (ref 13.0–17.1)
LYMPH#: 0.5 10*3/uL — AB (ref 0.9–3.3)
LYMPH%: 4.6 % — ABNORMAL LOW (ref 14.0–49.0)
MCH: 32.1 pg (ref 27.2–33.4)
MCHC: 32.9 g/dL (ref 32.0–36.0)
MCV: 97.7 fL (ref 79.3–98.0)
MONO#: 0.1 10*3/uL (ref 0.1–0.9)
MONO%: 0.6 % (ref 0.0–14.0)
NEUT%: 94.8 % — ABNORMAL HIGH (ref 39.0–75.0)
NEUTROS ABS: 10 10*3/uL — AB (ref 1.5–6.5)
PLATELETS: 416 10*3/uL — AB (ref 140–400)
RBC: 3.09 10*6/uL — AB (ref 4.20–5.82)
RDW: 17.8 % — ABNORMAL HIGH (ref 11.0–14.6)
WBC: 10.6 10*3/uL — AB (ref 4.0–10.3)

## 2015-03-19 MED ORDER — LORAZEPAM 0.5 MG PO TABS: 0.5000 mg | ORAL_TABLET | Freq: Every evening | ORAL | Status: DC | PRN

## 2015-03-19 MED ORDER — BORTEZOMIB CHEMO SQ INJECTION 3.5 MG (2.5MG/ML)
1.3000 mg/m2 | Freq: Once | INTRAMUSCULAR | Status: AC
  Administered 2015-03-19: 2.25 mg via SUBCUTANEOUS
  Filled 2015-03-19: qty 2.25

## 2015-03-19 MED ORDER — SODIUM CHLORIDE 0.9 % IV SOLN: Freq: Once | INTRAVENOUS | Status: DC

## 2015-03-19 MED ORDER — ONDANSETRON HCL 8 MG PO TABS
ORAL_TABLET | ORAL | Status: AC
  Filled 2015-03-19: qty 1

## 2015-03-19 MED ORDER — ONDANSETRON HCL 8 MG PO TABS
8.0000 mg | ORAL_TABLET | Freq: Once | ORAL | Status: AC
  Administered 2015-03-19: 8 mg via ORAL

## 2015-03-19 MED FILL — LORazepam 0.5 MG TABS: 0.5 | 30 days supply | Qty: 30 | Fill #0

## 2015-03-19 NOTE — Progress Notes (Signed)
[place]  [phone] Fax:([phone]) [phone]    ID: Patsy Varma DOB: 1975-05-09  MR#: [id number]  NKN#:[id number]  Patient Care Team: Tresa Garter, MD as PCP - General (Internal Medicine) [name], NP as Nurse Practitioner (Hematology and Oncology) [name], MD as Consulting Physician (Oncology) PCP: [name], MD GYN: SU:  OTHER MD:  CHIEF COMPLAINT: multiple myeloma  CURRENT TREATMENT:  Bortezomib, dexamethasone, Denosumab/Xgeva  HISTORY  OF MULTIPLE MYELOMA: From the original intake note January 2016:  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 The pain was worse with coughing. Exam was unremarkable, and he was treated with Mobic. No labs were obtained. On 01/30/2014 he presented to the emergency room at [facility] with similar complaints and also reported a 10 pound weight loss over the past month. A chest x-ray was significant only for mild atelectasis, but ACT NGO of the chest 01/30/2014, while it showed no pulmonary emboli, showed an osteolytic lesion destroying the manubrium of the sternum. There was extra osseous extension into the anterior mediastinum.  The patient was admitted and found to have a creatinine of 1.64 with a GFR of 51. Bilateral renal ultrasound showed no hydronephrosis or masses, and normal size and cortical thickness of both kidneys, though there was mild diffuse echogenicity. Urine total protein was 145 mg/dL and immunofixation showed a monoclonal IgG kappa protein as well as monoclonal free kappa light chains. On 01/31/2014 serum protein electrophoresis showed an M spike of 2.22 g/dL with a total protein of 8.3 and albumin 3.4. Kappa lambda light chains from the serum obtained 02/04/2014 showed 10.50 mg/dL on the, 1.74 in the lambda, with an elevated ratio  at 6.03. Beta-2 microglobulin was 7.36. LDH was normal at 184 and HIV antibody was nonreactive.  Biopsy of the manubrial mass 02/03/2014 showed extensive infiltration of the bone by sheets of atypical plasma cells, positive for CD138, kappa restricted (SZA 16-23). Right iliac bone marrow biopsy [name] 05/21/2014 (FZB 16-1) showed an average of 12% plasma cells with numerous aggregates and sheets of atypical plasma cells, which were kappa restricted. Cytogenetic analysis is pending  The patient's subsequent history is as detailed below  INTERVAL HISTORY: Kaesyn returns today for follow-up of his relapsed multiple myeloma. Today is day 1, cycle 7 of his weekly bortezomib. He also receives 10m oral dexamethasone twice week, taken on every Thursday and Friday.   REVIEW OF SYSTEMS: Zhion is doing well this week. He denies fevers or chills. His minimal nausea is managed with zofran. He continues to move his bowels well and has no issue urinating. He is using tramadol as needed for pain, but this has not been a concern this week. He denies mouth sores or rashes. He continues to use lorazepam for sleep. He denies heartburn. A detailed review of systems is otherwise stable.  PAST MEDICAL HISTORY: Past Medical History  Diagnosis Date  . IgG myeloma ([place]   . CKD (chronic kidney disease), stage III   . Hypercholesterolemia   . Lytic bone lesions on xray     PAST SURGICAL HISTORY: No past surgical history on file.  FAMILY HISTORY Family History  Problem Relation Age of Onset  . Diabetes Father    The patient's parents are still living, in their late 743's The patient has 2 brothers, 3 sisters. There is noc cancer  in the fmaily to his knowledge.  SOCIAL HISTORY:  Originally from [facility], moved here 19 years ago. He works in Nurse, adult. Married (wife's name is [name]), lives in [place]. He has 5 children ages 19, 22, 85, 34 and 59 months [name], [name],  [name] and [place]) in good health. Best number to call him is [phone]    ADVANCED DIRECTIVES: not  In place   HEALTH MAINTENANCE: Social History  Substance Use Topics  . Smoking status: Never Smoker   . Smokeless tobacco: Never Used  . Alcohol Use: No     Colonoscopy:  PSA:  Bone density:  Lipid panel:  Allergies  Allergen Reactions  . Ciprofloxacin Rash  . Mobic [Meloxicam] Rash    Current Outpatient Prescriptions  Medication Sig Dispense Refill  . acyclovir (ZOVIRAX) 200 MG capsule Take 2 capsules (400 mg total) by mouth daily. 60 capsule 1  . dexamethasone (DECADRON) 4 MG tablet Tome 5 pastillas al desayuno cada martes y miercoles (take 5 tablets with breakfast every Tues and Wed) 50 tablet 12  . fluconazole (DIFLUCAN) 100 MG tablet Take 1 tablet (100 mg total) by mouth daily. 10 tablet 0  . ondansetron (ZOFRAN) 8 MG tablet Take 1 tablet (8 mg total) by mouth every 8 (eight) hours as needed for nausea or vomiting. 20 tablet 1  . pantoprazole (PROTONIX) 20 MG tablet Take 1 tablet (20 mg total) by mouth 2 (two) times daily. 60 tablet 3  . sulfamethoxazole-trimethoprim (BACTRIM DS,SEPTRA DS) 800-160 MG tablet Take 1 tablet by mouth 2 (two) times daily. 60 tablet 1  . diphenhydrAMINE (SOMINEX) 25 MG tablet Take 25 mg by mouth at bedtime as needed for allergies. Reported on 03/19/2015    . LORazepam (ATIVAN) 0.5 MG tablet Take 1 tablet (0.5 mg total) by mouth at bedtime as needed for anxiety. 30 tablet 0  . traMADol (ULTRAM) 50 MG tablet Take 1 tablet (50 mg total) by mouth every 6 (six) hours as needed. (Patient not taking: Reported on 03/19/2015) 60 tablet 0   No current facility-administered medications for this visit.    OBJECTIVE: [name] man in no acute distress Filed Vitals:   03/19/15 1501  BP: 143/73  Pulse: 98  Temp: 98.1 F (36.7 C)  Resp: 18     Body mass index is 26.79 kg/(m^2).    ECOG FS:1 - Symptomatic but completely ambulatory   Weight is back  to baseline  Sclerae unicteric, pupils round and equal Oropharynx clear and moist-- no thrush or other lesions No cervical or supraclavicular adenopathy Lungs no rales or rhonchi Heart regular rate and rhythm Abd soft, nontender, positive bowel sounds MSK no focal spinal tenderness, no upper extremity lymphedema Neuro: nonfocal, well oriented, appropriate affect   LAB RESULTS:   Ref Range 7d ago    IgG, Qn, Serum 700 - 1600 mg/dL 701   IgA, Qn, Serum 90 - 386 mg/dL 82 (L)   IgM, Qn, Serum 20 - 172 mg/dL 43   Total Protein 6.0 - 8.5 g/dL 5.6 (L)   Albumin SerPl Elph-Mcnc 2.9 - 4.4 g/dL 2.3 (L)   Alpha 1 0.0 - 0.4 g/dL 0.4   Alpha2 Glob SerPl Elph-Mcnc 0.4 - 1.0 g/dL 1.3 (H)   B-Globulin SerPl Elph-Mcnc 0.7 - 1.3 g/dL 0.8   Gamma Glob SerPl Elph-Mcnc 0.4 - 1.8 g/dL 0.7   M Protein SerPl Elph-Mcnc Not Observed g/dL 0.5 (H)   Globulin, Total 2.2 - 3.9 g/dL 3.3   Albumin/Glob  SerPl 0.7 - 1.7  0.7   IFE 1  Comment   Comments: Immunofixation shows IgG monoclonal protein with kappa light chain  specificity.     Please Note  Comment   Comments: Protein electrophoresis scan will follow via computer, mail, or  courier delivery.     Resulting Agency [facility]     Narrative     Testing performed at: [BN] [facility], [address], [place], [place], [address], Phone: [phone], Laboratory Director: Lindon Romp, MD       Specimen Collected: 02/12/15 2:33 PM Last Resulted: 02/16/15 2:38 PM                     CMP     Component Value Date/Time   NA 138 03/12/2015 1253   NA 144 01/27/2015 0605   K 4.8 03/12/2015 1253   K 4.8 01/27/2015 0605   CL 117* 01/27/2015 0605   CO2 16* 03/12/2015 1253   CO2 18* 01/27/2015 0605   GLUCOSE 122 03/12/2015 1253   GLUCOSE 116* 01/27/2015 0605   BUN 20.1 03/12/2015 1253   BUN 64* 01/27/2015 0605   CREATININE 1.4* 03/12/2015 1253   CREATININE 3.03* 01/27/2015 0605   CALCIUM 8.8 03/12/2015 1253    CALCIUM 6.3* 01/27/2015 0605   PROT 6.8 03/12/2015 1253   PROT 6.3 03/12/2015 1253   PROT 4.8* 01/25/2015 0740   ALBUMIN 2.8* 03/12/2015 1253   ALBUMIN 1.6* 01/27/2015 0605   AST 19 03/12/2015 1253   AST 18 01/25/2015 0740   ALT 16 03/12/2015 1253   ALT 32 01/25/2015 0740   ALKPHOS 94 03/12/2015 1253   ALKPHOS 107 01/25/2015 0740   BILITOT <0.30 03/12/2015 1253   BILITOT 0.1* 01/25/2015 0740   GFRNONAA 24* 01/27/2015 0605   GFRAA 28* 01/27/2015 0605    INo results found for: SPEP, UPEP  Lab Results  Component Value Date   WBC 10.6* 03/19/2015   NEUTROABS 10.0* 03/19/2015   HGB 9.9* 03/19/2015   HCT 30.2* 03/19/2015   MCV 97.7 03/19/2015   PLT 416* 03/19/2015      Chemistry      Component Value Date/Time   NA 138 03/12/2015 1253   NA 144 01/27/2015 0605   K 4.8 03/12/2015 1253   K 4.8 01/27/2015 0605   CL 117* 01/27/2015 0605   CO2 16* 03/12/2015 1253   CO2 18* 01/27/2015 0605   BUN 20.1 03/12/2015 1253   BUN 64* 01/27/2015 0605   CREATININE 1.4* 03/12/2015 1253   CREATININE 3.03* 01/27/2015 0605      Component Value Date/Time   CALCIUM 8.8 03/12/2015 1253   CALCIUM 6.3* 01/27/2015 0605   ALKPHOS 94 03/12/2015 1253   ALKPHOS 107 01/25/2015 0740   AST 19 03/12/2015 1253   AST 18 01/25/2015 0740   ALT 16 03/12/2015 1253   ALT 32 01/25/2015 0740   BILITOT <0.30 03/12/2015 1253   BILITOT 0.1* 01/25/2015 0740       No results found for: LABCA2  No components found for: EUMPN361  No results for input(s): INR in the last 168 hours.  Urinalysis    Component Value Date/Time   COLORURINE YELLOW 01/26/2015 0742   APPEARANCEUR CLOUDY* 01/26/2015 0742   LABSPEC 1.014 01/26/2015 0742   LABSPEC 1.030 01/22/2015 1018   PHURINE 5.0 01/26/2015 0742   PHURINE 5.0 01/22/2015 1018   GLUCOSEU NEGATIVE 01/26/2015 0742   GLUCOSEU Negative 01/22/2015 1018   HGBUR LARGE* 01/26/2015 0742   HGBUR Large 01/22/2015  [place] 01/26/2015 0742    BILIRUBINUR Negative 01/22/2015 [place] 01/26/2015 0742   KETONESUR Negative 01/22/2015 1018   PROTEINUR 30* 01/26/2015 0742   PROTEINUR 2000 01/22/2015 1018   UROBILINOGEN 0.2 01/22/2015 1018   UROBILINOGEN 0.2 01/31/2014 1050   NITRITE NEGATIVE 01/26/2015 0742   NITRITE Negative 01/22/2015 1018   LEUKOCYTESUR NEGATIVE 01/26/2015 0742   LEUKOCYTESUR Color Interference due to blood in urine 01/22/2015 1018    STUDIES: No results found.  ASSESSMENT: 40 y.o. 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;        I: CyBorD started January 2016  (a) dexamethasone 20 mg/d every TU/WED started 02/04/2014  (b) bortezomib sQ days 1,4,8,11 of every 21 day cycle started 02/10/2014  (c) cyclophosphamide 300 mg/M2 IV weekly started 02/13/2014       2: Lenalidomide 3m daily started 07/26/2014  (2) chronic kidney disease stage III  (3) hypercalcemia: zolendronate started 02/10/2014-- repeat every 4 weeks initially, then every 12 weeks  (a) changed to denosumab/Xgeva starting 02/26/2015 because of CKD  (4) ID prophylaxis:  (a) influenza and Pneumovax [PV13] vaccines 02/07/2014  (b) acyclovir started 02/07/2014  (c) HIV negative 01/31/2014  (d) Pneumovax P23 too be given after March 2016  (5). The patient is not a transplant candidate for financial reasons  (6) bilateral foot rash: Started on Septra DS and Diflucan 09/03/2014, to be continued for 10 days.  (7) Multiple Myeloma relapse in December 2016.   (a) starting 02/05/15: dexamethasone  244mx2 consecutive days weekly; bortezomib sQ weekly,   (b) Septra DS twice daily  (c) acyclovir 40014maily    PLAN: Elzie has no new issues or complaints today. The labs were reviewed in detail and were stable. He will proceed with cycle 7 of weekly bortezomib as planned.   We discussed use of septra and acyclovir as prophylactic treatment. He is only to use the fluconazole as needed for thrush.   I have refilled his lorazepam for sleep today.  Daltyn will return in 1 week for cycle 8 of bortezomib. He have follow up visits monthly at this point. He understands and agrees with this plan. He has been encouraged to call with any issues that might arise before his next visit here.   HeaLaurie PandaP   03/19/2015 3:27 PM

## 2015-03-21 ENCOUNTER — Telehealth: Payer: Self-pay | Admitting: Oncology

## 2015-03-21 NOTE — Telephone Encounter (Signed)
Per 2/16 pof cxd 2/23 f/u with HB and adjusted lab/tx times. Not able to reach patient or leave message. Left message for patient wife (cell) re change and mailed schedule.

## 2015-03-26 ENCOUNTER — Other Ambulatory Visit (HOSPITAL_BASED_OUTPATIENT_CLINIC_OR_DEPARTMENT_OTHER): Payer: Self-pay

## 2015-03-26 ENCOUNTER — Ambulatory Visit (HOSPITAL_BASED_OUTPATIENT_CLINIC_OR_DEPARTMENT_OTHER): Payer: Self-pay

## 2015-03-26 ENCOUNTER — Ambulatory Visit: Payer: Self-pay

## 2015-03-26 ENCOUNTER — Other Ambulatory Visit: Payer: Self-pay

## 2015-03-26 ENCOUNTER — Ambulatory Visit: Payer: Self-pay | Admitting: Nurse Practitioner

## 2015-03-26 VITALS — BP 154/85 | HR 99 | Temp 98.6°F | Resp 18

## 2015-03-26 DIAGNOSIS — M899 Disorder of bone, unspecified: Secondary | ICD-10-CM

## 2015-03-26 DIAGNOSIS — C9002 Multiple myeloma in relapse: Secondary | ICD-10-CM

## 2015-03-26 DIAGNOSIS — Z5112 Encounter for antineoplastic immunotherapy: Secondary | ICD-10-CM

## 2015-03-26 LAB — COMPREHENSIVE METABOLIC PANEL
ALBUMIN: 2.7 g/dL — AB (ref 3.5–5.0)
ALK PHOS: 104 U/L (ref 40–150)
ALT: 11 U/L (ref 0–55)
AST: 15 U/L (ref 5–34)
Anion Gap: 8 mEq/L (ref 3–11)
BUN: 19.5 mg/dL (ref 7.0–26.0)
CALCIUM: 9.1 mg/dL (ref 8.4–10.4)
CO2: 19 mEq/L — ABNORMAL LOW (ref 22–29)
CREATININE: 1.4 mg/dL — AB (ref 0.7–1.3)
Chloride: 111 mEq/L — ABNORMAL HIGH (ref 98–109)
EGFR: 62 mL/min/{1.73_m2} — ABNORMAL LOW (ref >90)
GLUCOSE: 172 mg/dL — AB (ref 70–140)
POTASSIUM: 4.3 meq/L (ref 3.5–5.1)
SODIUM: 138 meq/L (ref 136–145)
Total Bilirubin: <0.3 mg/dL (ref 0.20–1.20)
Total Protein: 6.7 g/dL (ref 6.4–8.3)

## 2015-03-26 LAB — CBC WITH DIFFERENTIAL/PLATELET
BASO%: 0 % (ref 0.0–2.0)
Basophils Absolute: 0 10*3/uL (ref 0.0–0.1)
EOS%: 0 % (ref 0.0–7.0)
Eosinophils Absolute: 0 10*3/uL (ref 0.0–0.5)
HCT: 30.5 % — ABNORMAL LOW (ref 38.4–49.9)
HGB: 10.1 g/dL — ABNORMAL LOW (ref 13.0–17.1)
LYMPH%: 4.3 % — AB (ref 14.0–49.0)
MCH: 32 pg (ref 27.2–33.4)
MCHC: 33.1 g/dL (ref 32.0–36.0)
MCV: 96.6 fL (ref 79.3–98.0)
MONO#: 0.1 10*3/uL (ref 0.1–0.9)
MONO%: 0.4 % (ref 0.0–14.0)
NEUT#: 13 10*3/uL — ABNORMAL HIGH (ref 1.5–6.5)
NEUT%: 95.3 % — AB (ref 39.0–75.0)
Platelets: 388 10*3/uL (ref 140–400)
RBC: 3.15 10*6/uL — AB (ref 4.20–5.82)
RDW: 17.4 % — ABNORMAL HIGH (ref 11.0–14.6)
WBC: 13.6 10*3/uL — ABNORMAL HIGH (ref 4.0–10.3)
lymph#: 0.6 10*3/uL — ABNORMAL LOW (ref 0.9–3.3)

## 2015-03-26 MED ORDER — DENOSUMAB 120 MG/1.7ML ~~LOC~~ SOLN
120.0000 mg | Freq: Once | SUBCUTANEOUS | Status: AC
  Administered 2015-03-26: 120 mg via SUBCUTANEOUS
  Filled 2015-03-26: qty 1.7

## 2015-03-26 MED ORDER — BORTEZOMIB CHEMO SQ INJECTION 3.5 MG (2.5MG/ML)
1.3000 mg/m2 | Freq: Once | INTRAMUSCULAR | Status: AC
  Administered 2015-03-26: 2.25 mg via SUBCUTANEOUS
  Filled 2015-03-26: qty 2.25

## 2015-03-26 MED ORDER — ONDANSETRON HCL 8 MG PO TABS
8.0000 mg | ORAL_TABLET | Freq: Once | ORAL | Status: AC
  Administered 2015-03-26: 8 mg via ORAL

## 2015-03-26 MED ORDER — ONDANSETRON HCL 8 MG PO TABS
ORAL_TABLET | ORAL | Status: AC
  Filled 2015-03-26: qty 1

## 2015-03-26 MED FILL — ACYCLOVIR 200 MG CAPSULE: 200 | 30 days supply | Qty: 60 | Fill #1

## 2015-03-26 NOTE — Patient Instructions (Addendum)
[facility] Discharge Instructions for Patients Receiving Chemotherapy  Today you received the following chemotherapy agents Velcade and Xgeva  To help prevent nausea and vomiting after your treatment, we encourage you to take your nausea medication as directed by your doctor.  If you develop nausea and vomiting that is not controlled by your nausea medication, call the clinic.   BELOW ARE SYMPTOMS THAT SHOULD BE REPORTED IMMEDIATELY:  *FEVER GREATER THAN 100.5 F  *CHILLS WITH OR WITHOUT FEVER  NAUSEA AND VOMITING THAT IS NOT CONTROLLED WITH YOUR NAUSEA MEDICATION  *UNUSUAL SHORTNESS OF BREATH  *UNUSUAL BRUISING OR BLEEDING  TENDERNESS IN MOUTH AND THROAT WITH OR WITHOUT PRESENCE OF ULCERS  *URINARY PROBLEMS  *BOWEL PROBLEMS  UNUSUAL RASH Items with * indicate a potential emergency and should be followed up as soon as possible.  Feel free to call the clinic you have any questions or concerns. The clinic phone number is [phone].  Please show the [place] at check-in to the Emergency Department and triage nurse.   Bortezomib injection (Velcade) Qu es este medicamento? El BORTEZOMIB es un medicamento que acta sobre una protena en las clulas cancerosas y detiene su crecimiento. [place] para tratar el mieloma mltiple y el linfoma de clulas del [place]. Este medicamento puede ser utilizado para otros usos; si tiene alguna pregunta consulte con su proveedor de atencin mdica o con su farmacutico. Qu le debo informar a mi profesional de la salud antes de tomar este medicamento? Necesita saber si usted presenta alguno de los [employer] o situaciones: -diabetes -enfermedad cardiaca -latidos cardacos irregulares -enfermedad heptica -en hemodialisis -bajos conteos sanguneos, incluyendo bajos conteos de glbulos blancos, plaquetas o hemoglobina -neuropata perifrica -si toma medicamentos para la presin sangunea -una reaccin  alrgica o inusual al bortezomib, al manitol, al boro, a otros medicamentos, alimentos, colorantes o conservantes -si est embarazada o buscando quedar embarazada -si est amamantando a un beb Cmo debo utilizar este medicamento? Este medicamento se administra mediante inyeccin por va intravenosa o inyeccin por debajo de la piel. Lo administra un profesional de la salud calificado en un hospital o en un entorno clnico. Hable con su pediatra para informarse acerca del uso de este medicamento en nios. Puede requerir atencin especial. Sobredosis: Pngase en contacto inmediatamente con un centro toxicolgico o una sala de urgencia si usted cree que haya tomado demasiado medicamento. ATENCIN: [employer] es solo para usted. No comparta este medicamento con nadie. Qu sucede si me olvido de una dosis? Es importante no olvidar ninguna dosis. Informe a su mdico o a su profesional de la salud si no puede asistir a Photographer. Qu puede interactuar con este medicamento? Esta medicina puede interactuar con los siguientes medicamentos: -quetoprofeno -rifampicina -ritonavir -hierba de [place] Puede ser que esta lista no menciona todas las posibles interacciones. Informe a su profesional de KB Home	[place] de [employer] productos a base de hierbas, medicamentos de [place] o suplementos nutritivos que est tomando. Si usted fuma, consume bebidas alcohlicas o si utiliza drogas ilegales, indqueselo tambin a su profesional de KB Home	[place]. Algunas sustancias pueden interactuar con su medicamento. A qu debo estar atento al usar Coca-Cola? Visite a su mdico para chequear su evolucin peridicamente. Este medicamento puede hacerle sentir un Nurse, mental health. Esto es normal ya que la quimioterapia afecta tanto a las clulas sanas como a las clulas cancerosas. Si presenta alguno de los [employer], infrmelos. Sin embargo, contine con el tratamiento aun si se siente  enfermo, a menos que su  mdico le indique que lo suspenda. Puede experimentar mareos o somnolencia. No conduzca ni utilice maquinaria ni haga nada que Associate Professor en estado de alerta hasta que sepa cmo le afecta este medicamento. No se siente ni se ponga de pie con rapidez, especialmente si es un paciente de edad avanzada. Esto reduce el riesgo de mareos o [employer]. En algunos casos, podr recibir [employer] para ayudarle con los efectos secundarios. Siga las instrucciones para usarlos. Consulte a su mdico o a su profesional de la salud por asesoramiento si tiene fiebre, escalofros, dolor de garganta o cualquier otro sntoma de resfro o gripe. No se trate usted mismo. Este medicamento puede reducir la capacidad del cuerpo para combatir infecciones. Trate de no acercarse a personas que estn enfermas. [employer] puede aumentar el riesgo de magulladuras o sangrado. Consulte a su mdico o a su profesional de la salud si observa sangrados inusuales. Usted podr Runner, broadcasting/film/video de sangre mientras est usando este medicamento. En algunos pacientes, este medicamento puede causar una infeccin cerebral grave que puede causar muerte. Si usted tiene [employer], pensar, Electrical engineer, caminar o estar de pie, informe a su mdico inmediatamente. Si no puede comunicarse con su mdico, buscar urgentemente otra fuente de asistencia mdica. No se debe quedar embarazada mientras recibe este medicamento. Las mujeres deben informar a su mdico si estn buscando quedar embarazadas o si creen que estn embarazadas. Existe la posibilidad de efectos secundarios graves a un beb sin nacer. Para ms informacin hable con su profesional de la salud o su farmacutico. No debe Economist a un beb mientras est usando este medicamento. Consulte con su mdico o profesional de la salud si tiene un ataque de diarrea severa, nuseas y vmitos, o si suda mucho. La prdida de demasiado lquido del cuerpo puede hacerlo  peligroso para que usted toma Coca[employer]a. Qu efectos secundarios puedo tener al [employer] este medicamento? Efectos secundarios que debe informar a su mdico o a Barrister's clerk de la salud tan pronto como sea posible: -Chief of Staff como erupcin cutnea, picazn o urticarias, hinchazn de la cara, labios o lengua -problemas respiratorios -cambios de audicin -cambios en la visin -pulso cardiaco rpido, irregular -sensacin de desmayos o aturdimiento, cadas -dolor, hormigueo o entumecimiento de manos o pies -dolor en la regin abdominal superior derecha -convulsiones -hinchazn de los tobillos, pies, manos -sangrado o magulladuras inusuales -cansancio o debilidad inusual -vmito -color amarillento de los ojos o la piel Efectos secundarios que, por lo general, no requieren atencin mdica (debe informarlos a su mdico o a su profesional de la salud si persisten o si son molestos): -cambios de emociones o de humor -estreimiento -diarrea -prdida del apetito -dolor de cabeza -Actor de la inyeccin -nuseas Puede ser que esta lista no menciona todos los posibles efectos secundarios. Comunquese a su mdico por asesoramiento mdico [employer]. Usted puede informar los efectos secundarios a la FDA por telfono al 1-800-FDA-1088. Dnde debo guardar mi medicina? Este medicamento se administra en hospitales o clnicas y no necesitar guardarlo en su domicilio. ATENCIN: Este folleto es un resumen. Puede ser que no cubra toda la posible informacin. Si usted tiene preguntas acerca de esta medicina, consulte con su mdico, su farmacutico o su profesional de Technical sales engineer.    2016, Elsevier/Gold Standard. (2014-05-30 00:00:00)   Denosumab injection Charles [name] es este medicamento? El DENOSUMAB desacelera la descomposicin de los [place]. Prolia se utiliza para tratar la osteoporosis  en hombres y en mujeres despus de la menopausia. Delton See se  [place] para prevenir fracturas de huesos y otros problemas de huesos provocados por la metstasis en el cncer de huesos. Delton See se utiliza tambin para tratar tumor de clulas gigantes del hueso. Este medicamento puede ser utilizado para otros usos; si tiene alguna pregunta consulte con su proveedor de atencin mdica o con su farmacutico. Qu le debo informar a mi profesional de la salud antes de tomar este medicamento? Necesita saber si usted presenta alguno de los siguientes problemas o situaciones: enfermedad dental eccema infeccin o antecedentes de infecciones enfermedad renal o si est bajo tratamiento de dilisis bajo nivel de calcio o vitamina D en la sangre sndrome de malabsorcin una ciruga programada o extracciones dentales si toma medicamentos que contienen denosumab enfermedad tiroidea o de las paratiroides una reaccin alrgica o inusual al denosumab, a otros medicamentos, alimentos, colorantes o conservantes si est embarazada o buscando quedar embarazada si est amamantando a un beb Cmo debo utilizar este medicamento? Este medicamento se administra mediante inyeccin por va subcutnea. Lo administra un profesional de Technical sales engineer en un hospital o en un entorno clnico. Si recibe Prolia, su farmacutico le dar una [place] del medicamento especial con cada receta y relleno. Asegrese de leer esta informacin cada vez cuidadosamente. Para Prolia, consulte con su pediatra para informarse acerca del uso de este medicamento en nios. Puede requerir atencin especial. Jacklyn Shell, consulte con su pediatra para informase acerca del uso de este medicamento en nios. Aunque este medicamento se puede recetar a nios tan menores como de 13 aos de edad para condiciones selectivas, las precauciones se aplican. Sobredosis: Pngase en contacto inmediatamente con un centro toxicolgico o una sala de urgencia si usted cree que haya tomado demasiado medicamento. ATENCIN: [employer] es solo para  usted. No comparta este medicamento con nadie. Qu sucede si me olvido de una dosis? Es importante no olvidar ninguna dosis. Informe a su mdico o a su profesional de la salud si no puede asistir a Photographer. Qu puede interactuar con este medicamento? No tome esta medicina con ninguno de los siguientes medicamentos: otros medicamentos que contienen denosumab Esta medicina tambin puede Counselling psychologist con los siguientes medicamentos: medicamentos que suprimen el sistema inmunolgico medicamentos para tratar Risk analyst esteroideos, como la prednisona o la cortisona Puede ser que esta lista no menciona todas las posibles interacciones. Informe a su profesional de KB Home	[place] de [employer] productos a base de hierbas, medicamentos de [place] o suplementos nutritivos que est tomando. Si usted fuma, consume bebidas alcohlicas o si utiliza drogas ilegales, indqueselo tambin a su profesional de KB Home	[place]. Algunas sustancias pueden interactuar con su medicamento. A qu debo estar atento al usar Coca-Cola? Visite a su mdico o a su profesional de la salud para chequeos peridicos. Su mdico o su profesional de la salud puede pedirle anlisis de [place] u otras pruebas para Chief Technology Officer su evolucin. Si desarrolla un resfro u otra infeccin mientras est recibiendo Coca-Cola, consulte a su mdico o su profesional de Technical sales engineer. No se trate usted mismo. Este medicamento puede reducir la capacidad del cuerpo para combatir infecciones. Debe asegurarse de que su dieta incluya la cantidad necesaria de calcio y vitamina D mientras est tomando este medicamento, a menos que su mdico indique lo contrario. Hable con su profesional de la salud acerca de sus alimentos y las vitaminas que est tomando. Visite a su dentista habitualmente. [place] sus dientes o use hilo dental para  los dientes como indicado. Antes de someterse a Academic librarian, informe a su dentista que est [employer]. No se debe quedar embarazada mientras est tomando este medicamento o por 5 meses despus de terminarlo. Las mujeres deben informar a su mdico si estn buscando quedar embarazadas o si creen que estn embarazadas. Existe la posibilidad de efectos secundarios graves a un beb sin nacer. Para ms informacin hable con su profesional de la salud o su farmacutico. Qu efectos secundarios puedo tener al utilizar este medicamento? Efectos secundarios que debe informar a su mdico o a Barrister's clerk de la salud tan pronto como sea posible: Chief of Staff como erupcin cutnea, picazn o urticarias, hinchazn de la cara, labios o lengua problemas respiratorios dolor en el pecho pulso cardiaco rpido, irregular sensacin de desmayos o mareos, cadas fiebre, escalofros o cualquier otro signo de infeccin espasmos o rigidez de los msculos hormigueo o entumecimiento formacin de bultos o ampollas, o piel seca, roja o descamacin de la piel cicatrizacin lenta o dolor de mandbula o boca sin explicacin sangrado o magulladuras inusuales Efectos secundarios que, por lo general, no requieren atencin mdica (debe informarlos a su mdico o a su profesional de la salud si persisten o si son molestos): Geneticist, molecular o gases estomacales Puede ser que esta lista no menciona todos los posibles efectos secundarios. Comunquese a su mdico por asesoramiento mdico [employer]. Usted puede informar los efectos secundarios a la FDA por telfono al 1-800-FDA-1088. Dnde debo guardar mi medicina? Este medicamento se administra en clnicas, consultorio del mdico u otro establecimiento de atencin mdica y no necesitar guardarlo en su domicilio. ATENCIN: Este folleto es un resumen. Puede ser que no cubra toda la posible informacin. Si usted tiene preguntas acerca de esta medicina, consulte con su mdico, su farmacutico o su profesional de Technical sales engineer.    2016, Elsevier/Gold  Standard. (2014-03-12 00:00:00)

## 2015-04-02 ENCOUNTER — Other Ambulatory Visit (HOSPITAL_BASED_OUTPATIENT_CLINIC_OR_DEPARTMENT_OTHER): Payer: Self-pay

## 2015-04-02 ENCOUNTER — Ambulatory Visit (HOSPITAL_BASED_OUTPATIENT_CLINIC_OR_DEPARTMENT_OTHER): Payer: Self-pay

## 2015-04-02 VITALS — BP 120/60 | HR 102 | Temp 98.7°F | Resp 19

## 2015-04-02 DIAGNOSIS — M899 Disorder of bone, unspecified: Secondary | ICD-10-CM

## 2015-04-02 DIAGNOSIS — C9002 Multiple myeloma in relapse: Secondary | ICD-10-CM

## 2015-04-02 DIAGNOSIS — Z5112 Encounter for antineoplastic immunotherapy: Secondary | ICD-10-CM

## 2015-04-02 LAB — COMPREHENSIVE METABOLIC PANEL
ALBUMIN: 2.7 g/dL — AB (ref 3.5–5.0)
ALK PHOS: 112 U/L (ref 40–150)
ALT: 16 U/L (ref 0–55)
AST: 19 U/L (ref 5–34)
Anion Gap: 10 mEq/L (ref 3–11)
BUN: 23.3 mg/dL (ref 7.0–26.0)
CALCIUM: 8.8 mg/dL (ref 8.4–10.4)
CO2: 17 mEq/L — ABNORMAL LOW (ref 22–29)
CREATININE: 1.7 mg/dL — AB (ref 0.7–1.3)
Chloride: 112 mEq/L — ABNORMAL HIGH (ref 98–109)
EGFR: 50 mL/min/{1.73_m2} — ABNORMAL LOW (ref >90)
GLUCOSE: 139 mg/dL (ref 70–140)
POTASSIUM: 4.8 meq/L (ref 3.5–5.1)
SODIUM: 139 meq/L (ref 136–145)
TOTAL PROTEIN: 6.7 g/dL (ref 6.4–8.3)
Total Bilirubin: <0.3 mg/dL (ref 0.20–1.20)

## 2015-04-02 LAB — CBC WITH DIFFERENTIAL/PLATELET
BASO%: 0 % (ref 0.0–2.0)
Basophils Absolute: 0 10*3/uL (ref 0.0–0.1)
EOS%: 0 % (ref 0.0–7.0)
Eosinophils Absolute: 0 10*3/uL (ref 0.0–0.5)
HEMATOCRIT: 28 % — AB (ref 38.4–49.9)
HEMOGLOBIN: 9.3 g/dL — AB (ref 13.0–17.1)
LYMPH#: 0.7 10*3/uL — AB (ref 0.9–3.3)
LYMPH%: 6.6 % — ABNORMAL LOW (ref 14.0–49.0)
MCH: 32.6 pg (ref 27.2–33.4)
MCHC: 33.3 g/dL (ref 32.0–36.0)
MCV: 98 fL (ref 79.3–98.0)
MONO#: 0.1 10*3/uL (ref 0.1–0.9)
MONO%: 0.6 % (ref 0.0–14.0)
NEUT%: 92.8 % — ABNORMAL HIGH (ref 39.0–75.0)
NEUTROS ABS: 9.1 10*3/uL — AB (ref 1.5–6.5)
Platelets: 404 10*3/uL — ABNORMAL HIGH (ref 140–400)
RBC: 2.85 10*6/uL — ABNORMAL LOW (ref 4.20–5.82)
RDW: 17.4 % — AB (ref 11.0–14.6)
WBC: 9.9 10*3/uL (ref 4.0–10.3)

## 2015-04-02 MED ORDER — BORTEZOMIB CHEMO SQ INJECTION 3.5 MG (2.5MG/ML)
1.3000 mg/m2 | Freq: Once | INTRAMUSCULAR | Status: AC
  Administered 2015-04-02: 2.25 mg via SUBCUTANEOUS
  Filled 2015-04-02: qty 2.25

## 2015-04-02 MED ORDER — ONDANSETRON HCL 8 MG PO TABS
8.0000 mg | ORAL_TABLET | Freq: Once | ORAL | Status: AC
  Administered 2015-04-02: 8 mg via ORAL

## 2015-04-02 MED ORDER — SODIUM CHLORIDE 0.9 % IV SOLN: Freq: Once | INTRAVENOUS | Status: DC

## 2015-04-02 MED ORDER — ONDANSETRON HCL 8 MG PO TABS
ORAL_TABLET | ORAL | Status: AC
  Filled 2015-04-02: qty 1

## 2015-04-02 MED FILL — ONDANSETRON HCL 8 MG TABLET: 8 | 6 days supply | Qty: 20 | Fill #1

## 2015-04-02 NOTE — Patient Instructions (Signed)
[facility] Discharge Instructions for Patients Receiving Chemotherapy  Today you received the following chemotherapy agents Velcade. To help prevent nausea and vomiting after your treatment, we encourage you to take your nausea medication as directed.  If you develop nausea and vomiting that is not controlled by your nausea medication, call the clinic.   BELOW ARE SYMPTOMS THAT SHOULD BE REPORTED IMMEDIATELY:  *FEVER GREATER THAN 100.5 F  *CHILLS WITH OR WITHOUT FEVER  NAUSEA AND VOMITING THAT IS NOT CONTROLLED WITH YOUR NAUSEA MEDICATION  *UNUSUAL SHORTNESS OF BREATH  *UNUSUAL BRUISING OR BLEEDING  TENDERNESS IN MOUTH AND THROAT WITH OR WITHOUT PRESENCE OF ULCERS  *URINARY PROBLEMS  *BOWEL PROBLEMS  UNUSUAL RASH Items with * indicate a potential emergency and should be followed up as soon as possible.  Feel free to call the clinic you have any questions or concerns. The clinic phone number is [phone].  Please show the CHEMO ALERT CARD at check-in to the Emergency Department and triage nurse.    

## 2015-04-02 NOTE — Progress Notes (Signed)
OK to treat with to days labs per Dr. Jana Hakim.Marlon Pel RN

## 2015-04-03 ENCOUNTER — Other Ambulatory Visit: Payer: Self-pay | Admitting: *Deleted

## 2015-04-03 DIAGNOSIS — C9000 Multiple myeloma not having achieved remission: Secondary | ICD-10-CM

## 2015-04-03 MED ORDER — ONDANSETRON HCL 8 MG PO TABS: 8.0000 mg | ORAL_TABLET | Freq: Three times a day (TID) | ORAL | Status: DC | PRN

## 2015-04-03 MED FILL — ONDANSETRON HCL 8 MG TABLET: 8 | 6 days supply | Qty: 20 | Fill #0

## 2015-04-09 ENCOUNTER — Other Ambulatory Visit (HOSPITAL_BASED_OUTPATIENT_CLINIC_OR_DEPARTMENT_OTHER): Payer: Medicaid Other

## 2015-04-09 ENCOUNTER — Other Ambulatory Visit: Payer: Self-pay | Admitting: Oncology

## 2015-04-09 ENCOUNTER — Ambulatory Visit (HOSPITAL_BASED_OUTPATIENT_CLINIC_OR_DEPARTMENT_OTHER): Payer: Medicaid Other

## 2015-04-09 VITALS — BP 137/72 | HR 92 | Temp 98.4°F | Resp 18

## 2015-04-09 DIAGNOSIS — C9002 Multiple myeloma in relapse: Secondary | ICD-10-CM

## 2015-04-09 DIAGNOSIS — M899 Disorder of bone, unspecified: Secondary | ICD-10-CM

## 2015-04-09 DIAGNOSIS — Z5112 Encounter for antineoplastic immunotherapy: Secondary | ICD-10-CM

## 2015-04-09 LAB — CBC WITH DIFFERENTIAL/PLATELET
BASO%: 0 % (ref 0.0–2.0)
BASOS ABS: 0 10*3/uL (ref 0.0–0.1)
EOS ABS: 0 10*3/uL (ref 0.0–0.5)
EOS%: 0 % (ref 0.0–7.0)
HCT: 29.9 % — ABNORMAL LOW (ref 38.4–49.9)
HEMOGLOBIN: 9.8 g/dL — AB (ref 13.0–17.1)
LYMPH%: 5.2 % — AB (ref 14.0–49.0)
MCH: 32.2 pg (ref 27.2–33.4)
MCHC: 32.9 g/dL (ref 32.0–36.0)
MCV: 98 fL (ref 79.3–98.0)
MONO#: 0.1 10*3/uL (ref 0.1–0.9)
MONO%: 0.8 % (ref 0.0–14.0)
NEUT#: 8.9 10*3/uL — ABNORMAL HIGH (ref 1.5–6.5)
NEUT%: 94 % — AB (ref 39.0–75.0)
Platelets: 464 10*3/uL — ABNORMAL HIGH (ref 140–400)
RBC: 3.05 10*6/uL — AB (ref 4.20–5.82)
RDW: 17.2 % — AB (ref 11.0–14.6)
WBC: 9.4 10*3/uL (ref 4.0–10.3)
lymph#: 0.5 10*3/uL — ABNORMAL LOW (ref 0.9–3.3)

## 2015-04-09 LAB — COMPREHENSIVE METABOLIC PANEL
ALK PHOS: 100 U/L (ref 40–150)
ALT: 13 U/L (ref 0–55)
AST: 20 U/L (ref 5–34)
Albumin: 2.8 g/dL — ABNORMAL LOW (ref 3.5–5.0)
Anion Gap: 10 mEq/L (ref 3–11)
BUN: 23.5 mg/dL (ref 7.0–26.0)
CHLORIDE: 110 meq/L — AB (ref 98–109)
CO2: 20 meq/L — AB (ref 22–29)
Calcium: 9.5 mg/dL (ref 8.4–10.4)
Creatinine: 1.6 mg/dL — ABNORMAL HIGH (ref 0.7–1.3)
EGFR: 55 mL/min/{1.73_m2} — AB (ref >90)
GLUCOSE: 145 mg/dL — AB (ref 70–140)
POTASSIUM: 4.5 meq/L (ref 3.5–5.1)
SODIUM: 139 meq/L (ref 136–145)
Total Bilirubin: <0.3 mg/dL (ref 0.20–1.20)
Total Protein: 7.4 g/dL (ref 6.4–8.3)

## 2015-04-09 MED ORDER — ONDANSETRON HCL 8 MG PO TABS
8.0000 mg | ORAL_TABLET | Freq: Once | ORAL | Status: AC
  Administered 2015-04-09: 8 mg via ORAL

## 2015-04-09 MED ORDER — ONDANSETRON HCL 8 MG PO TABS
ORAL_TABLET | ORAL | Status: AC
  Filled 2015-04-09: qty 1

## 2015-04-09 MED ORDER — BORTEZOMIB CHEMO SQ INJECTION 3.5 MG (2.5MG/ML)
1.3000 mg/m2 | Freq: Once | INTRAMUSCULAR | Status: AC
  Administered 2015-04-09: 2.25 mg via SUBCUTANEOUS
  Filled 2015-04-09: qty 2.25

## 2015-04-09 MED FILL — PANTOPRAZOLE SOD DR 20 MG T: 20 | 30 days supply | Qty: 60 | Fill #1

## 2015-04-09 NOTE — Telephone Encounter (Signed)
Chart reviewed.

## 2015-04-09 NOTE — Patient Instructions (Signed)
[facility] Discharge Instructions for Patients Receiving Chemotherapy  Today you received the following chemotherapy agents Velcade. To help prevent nausea and vomiting after your treatment, we encourage you to take your nausea medication as directed.  If you develop nausea and vomiting that is not controlled by your nausea medication, call the clinic.   BELOW ARE SYMPTOMS THAT SHOULD BE REPORTED IMMEDIATELY:  *FEVER GREATER THAN 100.5 F  *CHILLS WITH OR WITHOUT FEVER  NAUSEA AND VOMITING THAT IS NOT CONTROLLED WITH YOUR NAUSEA MEDICATION  *UNUSUAL SHORTNESS OF BREATH  *UNUSUAL BRUISING OR BLEEDING  TENDERNESS IN MOUTH AND THROAT WITH OR WITHOUT PRESENCE OF ULCERS  *URINARY PROBLEMS  *BOWEL PROBLEMS  UNUSUAL RASH Items with * indicate a potential emergency and should be followed up as soon as possible.  Feel free to call the clinic you have any questions or concerns. The clinic phone number is [phone].  Please show the CHEMO ALERT CARD at check-in to the Emergency Department and triage nurse.    

## 2015-04-09 NOTE — Progress Notes (Signed)
Per Dr. Jana Hakim, okay to treat with creatinine of 1.6 today.

## 2015-04-10 LAB — KAPPA/LAMBDA LIGHT CHAINS
IG LAMBDA FREE LIGHT CHAIN: 5.94 mg/L (ref 5.71–26.30)
Ig Kappa Free Light Chain: 40.58 mg/L — ABNORMAL HIGH (ref 3.30–19.40)
KAPPA/LAMBDA FLC RATIO: 6.83 — AB (ref 0.26–1.65)

## 2015-04-10 LAB — BETA 2 MICROGLOBULIN, SERUM: Beta-2: 3.3 mg/L — ABNORMAL HIGH (ref 0.6–2.4)

## 2015-04-13 LAB — MULTIPLE MYELOMA PANEL, SERUM
ALBUMIN SERPL ELPH-MCNC: 2.8 g/dL — AB (ref 2.9–4.4)
ALPHA 1: 0.3 g/dL (ref 0.0–0.4)
ALPHA2 GLOB SERPL ELPH-MCNC: 1.4 g/dL — AB (ref 0.4–1.0)
Albumin/Glob SerPl: 0.8 (ref 0.7–1.7)
B-GLOBULIN SERPL ELPH-MCNC: 0.8 g/dL (ref 0.7–1.3)
GAMMA GLOB SERPL ELPH-MCNC: 1.4 g/dL (ref 0.4–1.8)
GLOBULIN, TOTAL: 3.8 g/dL (ref 2.2–3.9)
IGA/IMMUNOGLOBULIN A, SERUM: 32 mg/dL — AB (ref 90–386)
IgM, Qn, Serum: 28 mg/dL (ref 20–172)
M PROTEIN SERPL ELPH-MCNC: 1.2 g/dL — AB
Total Protein: 6.6 g/dL (ref 6.0–8.5)

## 2015-04-16 ENCOUNTER — Ambulatory Visit (HOSPITAL_BASED_OUTPATIENT_CLINIC_OR_DEPARTMENT_OTHER): Payer: Medicaid Other

## 2015-04-16 ENCOUNTER — Telehealth: Payer: Self-pay | Admitting: Oncology

## 2015-04-16 ENCOUNTER — Other Ambulatory Visit (HOSPITAL_BASED_OUTPATIENT_CLINIC_OR_DEPARTMENT_OTHER): Payer: Medicaid Other

## 2015-04-16 ENCOUNTER — Encounter: Payer: Self-pay | Admitting: Nurse Practitioner

## 2015-04-16 ENCOUNTER — Ambulatory Visit (HOSPITAL_BASED_OUTPATIENT_CLINIC_OR_DEPARTMENT_OTHER): Payer: Medicaid Other | Admitting: Nurse Practitioner

## 2015-04-16 ENCOUNTER — Other Ambulatory Visit: Payer: Self-pay | Admitting: *Deleted

## 2015-04-16 VITALS — BP 129/79 | HR 72 | Temp 97.9°F | Resp 18 | Ht 63.0 in | Wt 156.1 lb

## 2015-04-16 DIAGNOSIS — M899 Disorder of bone, unspecified: Secondary | ICD-10-CM

## 2015-04-16 DIAGNOSIS — C9002 Multiple myeloma in relapse: Secondary | ICD-10-CM

## 2015-04-16 DIAGNOSIS — C9000 Multiple myeloma not having achieved remission: Secondary | ICD-10-CM

## 2015-04-16 DIAGNOSIS — Z5112 Encounter for antineoplastic immunotherapy: Secondary | ICD-10-CM

## 2015-04-16 DIAGNOSIS — R310 Gross hematuria: Secondary | ICD-10-CM

## 2015-04-16 DIAGNOSIS — N183 Chronic kidney disease, stage 3 (moderate): Secondary | ICD-10-CM

## 2015-04-16 DIAGNOSIS — R319 Hematuria, unspecified: Secondary | ICD-10-CM

## 2015-04-16 LAB — CBC WITH DIFFERENTIAL/PLATELET
BASO%: 0.1 % (ref 0.0–2.0)
Basophils Absolute: 0 10*3/uL (ref 0.0–0.1)
EOS%: 0 % (ref 0.0–7.0)
Eosinophils Absolute: 0 10*3/uL (ref 0.0–0.5)
HCT: 29.4 % — ABNORMAL LOW (ref 38.4–49.9)
HEMOGLOBIN: 9.5 g/dL — AB (ref 13.0–17.1)
LYMPH%: 5.4 % — ABNORMAL LOW (ref 14.0–49.0)
MCH: 32.2 pg (ref 27.2–33.4)
MCHC: 32.3 g/dL (ref 32.0–36.0)
MCV: 99.7 fL — ABNORMAL HIGH (ref 79.3–98.0)
MONO#: 0.1 10*3/uL (ref 0.1–0.9)
MONO%: 1.1 % (ref 0.0–14.0)
NEUT%: 93.4 % — ABNORMAL HIGH (ref 39.0–75.0)
NEUTROS ABS: 11.4 10*3/uL — AB (ref 1.5–6.5)
Platelets: 454 10*3/uL — ABNORMAL HIGH (ref 140–400)
RBC: 2.95 10*6/uL — ABNORMAL LOW (ref 4.20–5.82)
RDW: 15.6 % — AB (ref 11.0–14.6)
WBC: 12.2 10*3/uL — AB (ref 4.0–10.3)
lymph#: 0.7 10*3/uL — ABNORMAL LOW (ref 0.9–3.3)

## 2015-04-16 LAB — COMPREHENSIVE METABOLIC PANEL
ALBUMIN: 2.6 g/dL — AB (ref 3.5–5.0)
ALK PHOS: 85 U/L (ref 40–150)
ALT: 11 U/L (ref 0–55)
AST: 18 U/L (ref 5–34)
Anion Gap: 7 mEq/L (ref 3–11)
BUN: 20 mg/dL (ref 7.0–26.0)
CO2: 24 mEq/L (ref 22–29)
Calcium: 9.3 mg/dL (ref 8.4–10.4)
Chloride: 111 mEq/L — ABNORMAL HIGH (ref 98–109)
Creatinine: 1.3 mg/dL (ref 0.7–1.3)
EGFR: 68 mL/min/{1.73_m2} — ABNORMAL LOW (ref >90)
GLUCOSE: 132 mg/dL (ref 70–140)
POTASSIUM: 4.2 meq/L (ref 3.5–5.1)
SODIUM: 143 meq/L (ref 136–145)
TOTAL PROTEIN: 7.1 g/dL (ref 6.4–8.3)
Total Bilirubin: <0.3 mg/dL (ref 0.20–1.20)

## 2015-04-16 MED ORDER — TRAMADOL HCL 50 MG PO TABS: 50.0000 mg | ORAL_TABLET | Freq: Four times a day (QID) | ORAL | Status: DC | PRN

## 2015-04-16 MED ORDER — ONDANSETRON HCL 8 MG PO TABS
8.0000 mg | ORAL_TABLET | Freq: Once | ORAL | Status: AC
  Administered 2015-04-16: 8 mg via ORAL

## 2015-04-16 MED ORDER — ONDANSETRON HCL 8 MG PO TABS
ORAL_TABLET | ORAL | Status: AC
  Filled 2015-04-16: qty 1

## 2015-04-16 MED ORDER — SULFAMETHOXAZOLE-TRIMETHOPRIM 800-160 MG PO TABS: 1.0000 | ORAL_TABLET | Freq: Two times a day (BID) | ORAL | Status: DC

## 2015-04-16 MED ORDER — LORAZEPAM 0.5 MG PO TABS: ORAL_TABLET | ORAL | Status: DC

## 2015-04-16 MED ORDER — FLUCONAZOLE 100 MG PO TABS: 100.0000 mg | ORAL_TABLET | Freq: Every day | ORAL | Status: DC

## 2015-04-16 MED ORDER — DEXAMETHASONE 4 MG PO TABS: ORAL_TABLET | ORAL | Status: DC

## 2015-04-16 MED ORDER — BORTEZOMIB CHEMO SQ INJECTION 3.5 MG (2.5MG/ML)
1.3000 mg/m2 | Freq: Once | INTRAMUSCULAR | Status: AC
  Administered 2015-04-16: 2.25 mg via SUBCUTANEOUS
  Filled 2015-04-16: qty 2.25

## 2015-04-16 MED FILL — DEXAMETHASONE 4 MG TABLET: 4 | 35 days supply | Qty: 50 | Fill #0

## 2015-04-16 MED FILL — LORazepam 0.5 MG TABS: 0.5 | 30 days supply | Qty: 30 | Fill #0

## 2015-04-16 MED FILL — traMADol HCL 50 MG TABS: 50 | 15 days supply | Qty: 60 | Fill #0

## 2015-04-16 MED FILL — SULFAMETHOXAZOLE/TMP DS TAB: 800-160 | 30 days supply | Qty: 60 | Fill #0

## 2015-04-16 MED FILL — FLUCONAZOLE 100 MG TABLET: 100 | 10 days supply | Qty: 10 | Fill #0

## 2015-04-16 NOTE — Progress Notes (Signed)
[place]  Telephone:([phone]) [phone] Fax:(336) [phone]    ID: Charles [name] DOB: 1975-09-12  MR#: [id number]  STM#:[id number]  Patient Care Team: Tresa Garter, MD as PCP - General (Internal Medicine) [name], NP as Nurse Practitioner (Hematology and Oncology) [name], MD as Consulting Physician (Oncology) PCP: [name], MD GYN: SU:  OTHER MD:  CHIEF COMPLAINT: multiple myeloma  CURRENT TREATMENT:  Bortezomib, dexamethasone, Denosumab/Xgeva  HISTORY  OF MULTIPLE MYELOMA: From the original intake note January 2016:  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 The pain was worse with coughing. Exam was unremarkable, and he was treated with Mobic. No labs were obtained. On 01/30/2014 he presented to the emergency room at [facility] with similar complaints and also reported a 10 pound weight loss over the past month. A chest x-ray was significant only for mild atelectasis, but ACT NGO of the chest 01/30/2014, while it showed no pulmonary emboli, showed an osteolytic lesion destroying the manubrium of the sternum. There was extra osseous extension into the anterior mediastinum.  The patient was admitted and found to have a creatinine of 1.64 with a GFR of 51. Bilateral renal ultrasound showed no hydronephrosis or masses, and normal size and cortical thickness of both kidneys, though there was mild diffuse echogenicity. Urine total protein was 145 mg/dL and immunofixation showed a monoclonal IgG kappa protein as well as monoclonal free kappa light chains. On 01/31/2014 serum protein electrophoresis showed an M spike of 2.22 g/dL with a total protein of 8.3 and albumin 3.4. Kappa lambda light chains from the serum obtained 02/04/2014 showed 10.50 mg/dL on the, 1.74 in the lambda, with an elevated ratio  at 6.03. Beta-2 microglobulin was 7.36. LDH was normal at 184 and HIV antibody was nonreactive.  Biopsy of the manubrial mass 02/03/2014 showed extensive infiltration of the bone by sheets of atypical plasma cells, positive for CD138, kappa restricted (SZA 16-23). Right iliac bone marrow biopsy [name] 05/21/2014 (FZB 16-1) showed an average of 12% plasma cells with numerous aggregates and sheets of atypical plasma cells, which were kappa restricted. Cytogenetic analysis is pending  The patient's subsequent history is as detailed below  INTERVAL HISTORY: Charles [name] returns today for follow-up of his relapsed multiple myeloma. Today is day 1, cycle 11 of his weekly bortezomib. He also receives 36m oral dexamethasone twice week, taken on every Thursday and Friday.   REVIEW OF SYSTEMS: Charles [name] has informed me that his hematuria has returned starting last Friday. He denies dsyuria, frequency, urgency, difficulty voiding, or change in the strength of his stream. Otherwise he is doing well. He denies fevers or chills. His minimal nausea is managed with zofran. He continues to move his bowels well. He is using tramadol as needed for pain. He denies mouth sores or rashes. He continues to use lorazepam for sleep. He denies heartburn. A detailed review of systems is otherwise stable.  PAST MEDICAL HISTORY: Past Medical History  Diagnosis Date  . IgG myeloma ([place]   . CKD (chronic kidney disease), stage III   . Hypercholesterolemia   . Lytic bone lesions on xray     PAST SURGICAL HISTORY: No past surgical history on file.  FAMILY HISTORY Family History  Problem Relation Age of Onset  . Diabetes Father    The patient's parents are still living, in their late  70's. The patient has 2 brothers, 3 sisters. There is noc cancer in the fmaily to his knowledge.  SOCIAL HISTORY:  Originally from [facility], moved here 19 years ago. He works in Nurse, adult. Married (wife's name is Charles [name]),  lives in [place]. He has 5 children ages 18, 41, 4, 46 and 23 months Charles [name], Charles [name], Charles [name] and Charles [name]) in good health. Best number to call him is [phone]    ADVANCED DIRECTIVES: not  In place   HEALTH MAINTENANCE: Social History  Substance Use Topics  . Smoking status: Never Smoker   . Smokeless tobacco: Never Used  . Alcohol Use: No     Colonoscopy:  PSA:  Bone density:  Lipid panel:  Allergies  Allergen Reactions  . Ciprofloxacin Rash  . Mobic [Meloxicam] Rash    Current Outpatient Prescriptions  Medication Sig Dispense Refill  . acyclovir (ZOVIRAX) 200 MG capsule Take 2 capsules (400 mg total) by mouth daily. 60 capsule 1  . ondansetron (ZOFRAN) 8 MG tablet Take 1 tablet (8 mg total) by mouth every 8 (eight) hours as needed for nausea or vomiting. 20 tablet 1  . pantoprazole (PROTONIX) 20 MG tablet Take 1 tablet (20 mg total) by mouth 2 (two) times daily. 60 tablet 3  . dexamethasone (DECADRON) 4 MG tablet Tome 5 pastillas al desayuno cada martes y miercoles (take 5 tablets with breakfast every Tues and Wed) 50 tablet 11  . diphenhydrAMINE (SOMINEX) 25 MG tablet Take 25 mg by mouth at bedtime as needed for allergies. Reported on 04/16/2015    . fluconazole (DIFLUCAN) 100 MG tablet Take 1 tablet (100 mg total) by mouth daily. 10 tablet 0  . LORazepam (ATIVAN) 0.5 MG tablet TAKE 1 TABLET BY MOUTH AT BEDTIME AS NEEDED FOR ANXIETY 30 tablet 1  . sulfamethoxazole-trimethoprim (BACTRIM DS,SEPTRA DS) 800-160 MG tablet Take 1 tablet by mouth 2 (two) times daily. 60 tablet 0  . traMADol (ULTRAM) 50 MG tablet Take 1 tablet (50 mg total) by mouth every 6 (six) hours as needed. 60 tablet 0   No current facility-administered medications for this visit.    OBJECTIVE: Charles [name] man in no acute distress Filed Vitals:   04/16/15 1355  BP: 129/79  Pulse: 72  Temp: 97.9 F (36.6 C)  Resp: 18     Body mass index is 27.66 kg/(m^2).    ECOG FS:1 - Symptomatic but  completely ambulatory   Weight is back to baseline  Skin: warm, dry  HEENT: sclerae anicteric, conjunctivae pink, oropharynx clear. No thrush or mucositis.  Lymph Nodes: No cervical or supraclavicular lymphadenopathy  Lungs: clear to auscultation bilaterally, no rales, wheezes, or rhonci  Heart: regular rate and rhythm  Abdomen: round, soft, non tender, positive bowel sounds  Musculoskeletal: No focal spinal tenderness, no peripheral edema  Neuro: non focal, well oriented, positive affect   Results for CARLIS, BLANCHARD (MRN [id number]) as of 04/16/2015 17:09  Ref. Range 02/12/2015 14:33 03/12/2015 12:53 04/09/2015 15:34  Ig Kappa Free Light Chain Latest Ref Range: 3.30-19.40 mg/L 26.08 (H) 38.97 (H) 40.58 (H)  Ig Lambda Free Light Chain Latest Ref Range: 5.71-26.30 mg/L 10.15 6.30 5.94  Kappa/Lambda FluidC Ratio Latest Ref Range: 0.26-1.65  2.57 (H) 6.19 (H) 6.83 (H)     CMP     Component Value Date/Time   NA 143 04/16/2015 1347   NA 144 01/27/2015 0605   K 4.2 04/16/2015 1347   K 4.8 01/27/2015 0605   CL 117* 01/27/2015 2229  CO2 24 04/16/2015 1347   CO2 18* 01/27/2015 0605   GLUCOSE 132 04/16/2015 1347   GLUCOSE 116* 01/27/2015 0605   BUN 20.0 04/16/2015 1347   BUN 64* 01/27/2015 0605   CREATININE 1.3 04/16/2015 1347   CREATININE 3.03* 01/27/2015 0605   CALCIUM 9.3 04/16/2015 1347   CALCIUM 6.3* 01/27/2015 0605   PROT 7.1 04/16/2015 1347   PROT 6.6 04/09/2015 1534   PROT 4.8* 01/25/2015 0740   ALBUMIN 2.6* 04/16/2015 1347   ALBUMIN 1.6* 01/27/2015 0605   AST 18 04/16/2015 1347   AST 18 01/25/2015 0740   ALT 11 04/16/2015 1347   ALT 32 01/25/2015 0740   ALKPHOS 85 04/16/2015 1347   ALKPHOS 107 01/25/2015 0740   BILITOT <0.30 04/16/2015 1347   BILITOT 0.1* 01/25/2015 0740   GFRNONAA 24* 01/27/2015 0605   GFRAA 28* 01/27/2015 0605    INo results found for: SPEP, UPEP  Lab Results  Component Value Date   WBC 12.2* 04/16/2015   NEUTROABS 11.4* 04/16/2015   HGB  9.5* 04/16/2015   HCT 29.4* 04/16/2015   MCV 99.7* 04/16/2015   PLT 454* 04/16/2015      Chemistry      Component Value Date/Time   NA 143 04/16/2015 1347   NA 144 01/27/2015 0605   K 4.2 04/16/2015 1347   K 4.8 01/27/2015 0605   CL 117* 01/27/2015 0605   CO2 24 04/16/2015 1347   CO2 18* 01/27/2015 0605   BUN 20.0 04/16/2015 1347   BUN 64* 01/27/2015 0605   CREATININE 1.3 04/16/2015 1347   CREATININE 3.03* 01/27/2015 0605      Component Value Date/Time   CALCIUM 9.3 04/16/2015 1347   CALCIUM 6.3* 01/27/2015 0605   ALKPHOS 85 04/16/2015 1347   ALKPHOS 107 01/25/2015 0740   AST 18 04/16/2015 1347   AST 18 01/25/2015 0740   ALT 11 04/16/2015 1347   ALT 32 01/25/2015 0740   BILITOT <0.30 04/16/2015 1347   BILITOT 0.1* 01/25/2015 0740       No results found for: LABCA2  No components found for: HYIFO277  No results for input(s): INR in the last 168 hours.  Urinalysis    Component Value Date/Time   COLORURINE YELLOW 01/26/2015 0742   APPEARANCEUR CLOUDY* 01/26/2015 0742   LABSPEC 1.014 01/26/2015 0742   LABSPEC 1.030 01/22/2015 1018   PHURINE 5.0 01/26/2015 0742   PHURINE 5.0 01/22/2015 1018   GLUCOSEU NEGATIVE 01/26/2015 0742   GLUCOSEU Negative 01/22/2015 1018   HGBUR LARGE* 01/26/2015 0742   HGBUR Large 01/22/2015 1018   BILIRUBINUR NEGATIVE 01/26/2015 0742   BILIRUBINUR Negative 01/22/2015 1018   KETONESUR NEGATIVE 01/26/2015 0742   KETONESUR Negative 01/22/2015 1018   PROTEINUR 30* 01/26/2015 0742   PROTEINUR 2000 01/22/2015 1018   UROBILINOGEN 0.2 01/22/2015 1018   UROBILINOGEN 0.2 01/31/2014 1050   NITRITE NEGATIVE 01/26/2015 0742   NITRITE Negative 01/22/2015 1018   LEUKOCYTESUR NEGATIVE 01/26/2015 0742   LEUKOCYTESUR Color Interference due to blood in urine 01/22/2015 1018    STUDIES: No results found.  ASSESSMENT: 40 y.o. Spanish speaker presenting January 2016 with bony pain, multiple lytic lesions, and monoclonal IgG kappa paraprotein in  serum (2.22 g/dL) and urine (145 mg/dL), bone marrow biopsy 02/03/2014 showing an average 12% plasmacytosis, with some sheets of abnormal plasma cells noted, cytogenetics pending; baseline beta 2 microglobulin was 7.36, baseline creatinine 1.64, with GFR 51, calcium on general 05/21/2014 was 10.8, LDH was normal  (1) Multiple myeloma stage IIA diagnosed  January 2016;        I: CyBorD started January 2016  (a) dexamethasone 20 mg/d every TU/WED started 02/04/2014  (b) bortezomib sQ days 1,4,8,11 of every 21 day cycle started 02/10/2014  (c) cyclophosphamide 300 mg/M2 IV weekly started 02/13/2014       2: Lenalidomide 81m daily started 07/26/2014  (2) chronic kidney disease stage III  (3) hypercalcemia: zolendronate started 02/10/2014-- repeat every 4 weeks initially, then every 12 weeks  (a) changed to denosumab/Xgeva starting 02/26/2015 because of CKD  (4) ID prophylaxis:  (a) influenza and Pneumovax [PV13] vaccines 02/07/2014  (b) acyclovir started 02/07/2014  (c) HIV negative 01/31/2014  (d) Pneumovax P23 too be given after March 2016  (5). The patient is not a transplant candidate for financial reasons  (6) bilateral foot rash: Started on Septra DS and Diflucan 09/03/2014, to be continued for 10 days.  (7) Multiple Myeloma relapse in December 2016.   (a) starting 02/05/15: dexamethasone 215mx2 consecutive days weekly; bortezomib sQ weekly,   (b) Septra DS twice daily  (c) acyclovir 40078maily    PLAN: I reviewed several notes in Cevin's chart from his previous hospital admission. The cause of his hematuria was never found. Theories ranged from prostatitis, to acute kidney injury. Courses of antibiotics did not resolve this issue and renal ultrasounds showed no abnormal results. I called [facility] and the patient never scheduled a cystoscopy with that office. I have made him an appointment with them on 4/7 at 2p to discuss this and investigate his hematuria further.   The  labs were reviewed in detail and his creatinine is actually down to the lowest it has been in months at 1.3. He will proceed with cycle 11 of bortezomib as planned today. I will discuss his myeloma panel with Dr. MagJana Hakimxt week. It seems that his M-spike and light chain ratio has been climbing again.   We refilled several medications today.  Jahsir will return in 1 week for cycle 12 of bortezomib. He have follow up visits monthly at this point. He understands and agrees with this plan. He has been encouraged to call with any issues that might arise before his next visit here.   HeaLaurie PandaP   04/16/2015 5:02 PM

## 2015-04-16 NOTE — Telephone Encounter (Signed)
Gave and printed appt sched and avs for pt for March and April °

## 2015-04-17 ENCOUNTER — Other Ambulatory Visit: Payer: Self-pay | Admitting: *Deleted

## 2015-04-23 ENCOUNTER — Ambulatory Visit: Payer: Medicaid Other

## 2015-04-23 ENCOUNTER — Other Ambulatory Visit: Payer: Self-pay

## 2015-04-23 ENCOUNTER — Ambulatory Visit: Payer: Self-pay

## 2015-04-23 ENCOUNTER — Ambulatory Visit (HOSPITAL_BASED_OUTPATIENT_CLINIC_OR_DEPARTMENT_OTHER): Payer: Medicaid Other | Admitting: Nurse Practitioner

## 2015-04-23 ENCOUNTER — Other Ambulatory Visit (HOSPITAL_BASED_OUTPATIENT_CLINIC_OR_DEPARTMENT_OTHER): Payer: Medicaid Other

## 2015-04-23 ENCOUNTER — Encounter: Payer: Self-pay | Admitting: Nurse Practitioner

## 2015-04-23 ENCOUNTER — Telehealth: Payer: Self-pay | Admitting: Oncology

## 2015-04-23 VITALS — BP 125/80 | HR 106 | Temp 98.5°F | Resp 18 | Wt 149.5 lb

## 2015-04-23 DIAGNOSIS — C9002 Multiple myeloma in relapse: Secondary | ICD-10-CM

## 2015-04-23 DIAGNOSIS — N183 Chronic kidney disease, stage 3 (moderate): Secondary | ICD-10-CM

## 2015-04-23 DIAGNOSIS — R5383 Other fatigue: Secondary | ICD-10-CM

## 2015-04-23 LAB — CBC WITH DIFFERENTIAL/PLATELET
BASO%: 0.5 % (ref 0.0–2.0)
BASOS ABS: 0.1 10*3/uL (ref 0.0–0.1)
EOS%: 0.4 % (ref 0.0–7.0)
Eosinophils Absolute: 0 10*3/uL (ref 0.0–0.5)
HCT: 32.5 % — ABNORMAL LOW (ref 38.4–49.9)
HGB: 10.8 g/dL — ABNORMAL LOW (ref 13.0–17.1)
LYMPH%: 16.4 % (ref 14.0–49.0)
MCH: 32.7 pg (ref 27.2–33.4)
MCHC: 33.3 g/dL (ref 32.0–36.0)
MCV: 98.1 fL — ABNORMAL HIGH (ref 79.3–98.0)
MONO#: 0.9 10*3/uL (ref 0.1–0.9)
MONO%: 8.1 % (ref 0.0–14.0)
NEUT#: 8.4 10*3/uL — ABNORMAL HIGH (ref 1.5–6.5)
NEUT%: 74.6 % (ref 39.0–75.0)
PLATELETS: 464 10*3/uL — AB (ref 140–400)
RBC: 3.31 10*6/uL — AB (ref 4.20–5.82)
RDW: 16.2 % — ABNORMAL HIGH (ref 11.0–14.6)
WBC: 11.2 10*3/uL — ABNORMAL HIGH (ref 4.0–10.3)
lymph#: 1.8 10*3/uL (ref 0.9–3.3)

## 2015-04-23 MED ORDER — ACYCLOVIR 200 MG PO CAPS: 400.0000 mg | ORAL_CAPSULE | Freq: Every day | ORAL | Status: DC

## 2015-04-23 MED FILL — ACYCLOVIR 200 MG CAPSULE: 200 | 30 days supply | Qty: 60 | Fill #0

## 2015-04-23 NOTE — Progress Notes (Signed)
Charles [name]  [phone]) [phone] [phone]) [phone]    ID: Charles [name] DOB: [birth year]  MR#: [id number]  TDD#:[id number]  Patient Care Team: Charles Garter, MD as PCP - General (Internal Medicine) Charles Borders, NP as Nurse Practitioner (Hematology and Oncology) Charles Cruel, MD as Consulting Physician (Oncology) PCP: Charles Chessman, MD GYN: SU:  OTHER MD:  CHIEF COMPLAINT: multiple myeloma  CURRENT TREATMENT:  Bortezomib, dexamethasone, Denosumab/Xgeva  HISTORY  OF MULTIPLE MYELOMA: From the original intake note January 2016:  Charles [name] (Charles [name] is not his first name; 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 The pain was worse with coughing. Exam was unremarkable, and he was treated with Mobic. No labs were obtained. On 01/30/2014 he presented to the emergency room at [facility] with similar complaints and also reported a 10 pound weight loss over the past month. A chest x-ray was significant only for mild atelectasis, but ACT NGO of the chest 01/30/2014, while it showed no pulmonary emboli, showed an osteolytic lesion destroying the manubrium of the sternum. There was extra osseous extension into the anterior mediastinum.  The patient was admitted and found to have a creatinine of 1.64 with a GFR of 51. Bilateral renal ultrasound showed no hydronephrosis or masses, and normal size and cortical thickness of both kidneys, though there was mild diffuse echogenicity. Urine total protein was 145 mg/dL and immunofixation showed a monoclonal IgG kappa protein as well as monoclonal free kappa light chains. On 01/31/2014 serum protein electrophoresis showed an M spike of 2.22 g/dL with a total protein of 8.3 and albumin 3.4. Kappa lambda light chains from the serum obtained 02/04/2014 showed 10.50 mg/dL on the, 1.74 in the lambda, with an elevated ratio  at 6.03. Beta-2 microglobulin was 7.36. LDH was normal at 184 and HIV antibody was nonreactive.  Biopsy of the manubrial mass 02/03/2014 showed extensive infiltration of the bone by sheets of atypical plasma cells, positive for CD138, kappa restricted (SZA 16-23). Right iliac bone marrow biopsy Charles [name] 05/21/2014 (FZB 16-1) showed an average of 12% plasma cells with numerous aggregates and sheets of atypical plasma cells, which were kappa restricted. Cytogenetic analysis is pending  The patient's subsequent history is as detailed below  INTERVAL HISTORY: Charles [name] returns today for follow-up of his relapsed multiple myeloma, accompanied by his wife, Charles [name]. Today is day 1, cycle 12 of his weekly bortezomib. He also takes 58m oral dexamethasone twice week, taken on every Thursday and Friday.   REVIEW OF SYSTEMS: Charles [name] is not feeling well today. His appetite is down. He has been increasingly fatigued and has slept for the most of the past 3 days. He is weaker than normal. He denies pain beyond a few headaches. These resolve with tramadol use. He denies fevers, chills, nausea, or vomiting. He is moving his bowel well. He still has small amounts of blood in his urine. He denies dsyuria, frequency, urgency, difficulty voiding, or change in the strength of his stream. He did feel a little better this morning since his high dose steroids were due today. A detailed review of systems is otherwise stable.  PAST MEDICAL HISTORY: Past Medical History  Diagnosis Date  . [place]   . CKD (chronic kidney disease), stage III   . Hypercholesterolemia   . Lytic bone lesions on xray     PAST SURGICAL HISTORY: No past surgical history on file.  FAMILY HISTORY Family History  Problem Relation Age of Onset  . Diabetes Father    The patient's parents are still living, in their late 26's. The patient has 2 brothers, 3 sisters. There is noc cancer in the fmaily to his knowledge.  SOCIAL HISTORY:    Originally from [facility] [place], moved here 19 years ago. He works in Nurse, adult. Married (wife's name is Charles [name]), lives in [place]. He has 5 children ages 33, 17, 15, 63 and 59 months Charles [name], Charles [name], Charles [name] and Charles [name]) in good health. Best number to call him is [phone]    ADVANCED DIRECTIVES: not  In place   HEALTH MAINTENANCE: Social History  Substance Use Topics  . Smoking status: Never Smoker   . Smokeless tobacco: Never Used  . Alcohol Use: No     Colonoscopy:  PSA:  Bone density:  Lipid panel:  Allergies  Allergen Reactions  . Ciprofloxacin Rash  . Mobic [Meloxicam] Rash    Current Outpatient Prescriptions  Medication Sig Dispense Refill  . acyclovir (ZOVIRAX) 200 MG capsule Take 2 capsules (400 mg total) by mouth daily. 60 capsule 1  . dexamethasone (DECADRON) 4 MG tablet Tome 5 pastillas al desayuno cada martes y miercoles (take 5 tablets with breakfast every Tues and Wed) 50 tablet 11  . fluconazole (DIFLUCAN) 100 MG tablet Take 1 tablet (100 mg total) by mouth daily. 10 tablet 0  . pantoprazole (PROTONIX) 20 MG tablet Take 1 tablet (20 mg total) by mouth 2 (two) times daily. 60 tablet 3  . sulfamethoxazole-trimethoprim (BACTRIM DS,SEPTRA DS) 800-160 MG tablet Take 1 tablet by mouth 2 (two) times daily. 60 tablet 0  . traMADol (ULTRAM) 50 MG tablet Take 1 tablet (50 mg total) by mouth every 6 (six) hours as needed. 60 tablet 0  . diphenhydrAMINE (SOMINEX) 25 MG tablet Take 25 mg by mouth at bedtime as needed for allergies. Reported on 04/23/2015    . LORazepam (ATIVAN) 0.5 MG tablet TAKE 1 TABLET BY MOUTH AT BEDTIME AS NEEDED FOR ANXIETY (Patient not taking: Reported on 04/23/2015) 30 tablet 1  . ondansetron (ZOFRAN) 8 MG tablet Take 1 tablet (8 mg total) by mouth every 8 (eight) hours as needed for nausea or vomiting. (Patient not taking: Reported on 04/23/2015) 20 tablet 1   No current facility-administered medications for this visit.     OBJECTIVE: Charles [name] man in no acute distress Filed Vitals:   04/23/15 1323  BP: 125/80  Pulse: 106  Temp: 98.5 F (36.9 C)  Resp: 18     Body mass index is 26.49 kg/(m^2).    ECOG FS:1 - Symptomatic but completely ambulatory   Weight is back to baseline  Skin: warm, dry  HEENT: sclerae anicteric, conjunctivae pink, oropharynx clear. No thrush or mucositis.  Lymph Nodes: No cervical or supraclavicular lymphadenopathy  Lungs: clear to auscultation bilaterally, no rales, wheezes, or rhonci  Heart: regular rate and rhythm  Abdomen: round, soft, non tender, positive bowel sounds  Musculoskeletal: No focal spinal tenderness, no peripheral edema  Neuro: non focal, well oriented, positive affect   Results for MOMEN, HAM (MRN [id number]) as of 04/16/2015 17:09  Ref. Range 02/12/2015 14:33 03/12/2015 12:53 04/09/2015 15:34  Ig Kappa Free Light Chain Latest Ref Range: 3.30-19.40 mg/L 26.08 (H) 38.97 (H) 40.58 (H)  Ig Lambda Free Light Chain Latest Ref Range: 5.71-26.30 mg/L 10.15 6.30 5.94  Kappa/Lambda FluidC Ratio Latest Ref Range: 0.26-1.65  2.57 (H) 6.19 (H) 6.83 (H)  CMP     Component Value Date/Time   NA 143 04/16/2015 1347   NA 144 01/27/2015 0605   K 4.2 04/16/2015 1347   K 4.8 01/27/2015 0605   CL 117* 01/27/2015 0605   CO2 24 04/16/2015 1347   CO2 18* 01/27/2015 0605   GLUCOSE 132 04/16/2015 1347   GLUCOSE 116* 01/27/2015 0605   BUN 20.0 04/16/2015 1347   BUN 64* 01/27/2015 0605   CREATININE 1.3 04/16/2015 1347   CREATININE 3.03* 01/27/2015 0605   CALCIUM 9.3 04/16/2015 1347   CALCIUM 6.3* 01/27/2015 0605   PROT 7.1 04/16/2015 1347   PROT 6.6 04/09/2015 1534   PROT 4.8* 01/25/2015 0740   ALBUMIN 2.6* 04/16/2015 1347   ALBUMIN 1.6* 01/27/2015 0605   AST 18 04/16/2015 1347   AST 18 01/25/2015 0740   ALT 11 04/16/2015 1347   ALT 32 01/25/2015 0740   ALKPHOS 85 04/16/2015 1347   ALKPHOS 107 01/25/2015 0740   BILITOT <0.30 04/16/2015 1347   BILITOT  0.1* 01/25/2015 0740   GFRNONAA 24* 01/27/2015 0605   GFRAA 28* 01/27/2015 0605    INo results found for: SPEP, UPEP  Lab Results  Component Value Date   WBC 11.2* 04/23/2015   NEUTROABS 8.4* 04/23/2015   HGB 10.8* 04/23/2015   HCT 32.5* 04/23/2015   MCV 98.1* 04/23/2015   PLT 464* 04/23/2015      Chemistry      Component Value Date/Time   NA 143 04/16/2015 1347   NA 144 01/27/2015 0605   K 4.2 04/16/2015 1347   K 4.8 01/27/2015 0605   CL 117* 01/27/2015 0605   CO2 24 04/16/2015 1347   CO2 18* 01/27/2015 0605   BUN 20.0 04/16/2015 1347   BUN 64* 01/27/2015 0605   CREATININE 1.3 04/16/2015 1347   CREATININE 3.03* 01/27/2015 0605      Component Value Date/Time   CALCIUM 9.3 04/16/2015 1347   CALCIUM 6.3* 01/27/2015 0605   ALKPHOS 85 04/16/2015 1347   ALKPHOS 107 01/25/2015 0740   AST 18 04/16/2015 1347   AST 18 01/25/2015 0740   ALT 11 04/16/2015 1347   ALT 32 01/25/2015 0740   BILITOT <0.30 04/16/2015 1347   BILITOT 0.1* 01/25/2015 0740       No results found for: LABCA2  No components found for: [id number]  No results for input(s): INR in the last 168 hours.  Urinalysis    Component Value Date/Time   COLORURINE YELLOW 01/26/2015 0742   APPEARANCEUR CLOUDY* 01/26/2015 0742   LABSPEC 1.014 01/26/2015 0742   LABSPEC 1.030 01/22/2015 1018   PHURINE 5.0 01/26/2015 0742   PHURINE 5.0 01/22/2015 1018   GLUCOSEU NEGATIVE 01/26/2015 0742   GLUCOSEU Negative 01/22/2015 1018   HGBUR LARGE* 01/26/2015 0742   HGBUR Large 01/22/2015 1018   BILIRUBINUR NEGATIVE 01/26/2015 0742   BILIRUBINUR Negative 01/22/2015 1018   KETONESUR NEGATIVE 01/26/2015 0742   KETONESUR Negative 01/22/2015 1018   PROTEINUR 30* 01/26/2015 0742   PROTEINUR 2000 01/22/2015 1018   UROBILINOGEN 0.2 01/22/2015 1018   UROBILINOGEN 0.2 01/31/2014 1050   NITRITE NEGATIVE 01/26/2015 0742   NITRITE Negative 01/22/2015 1018   LEUKOCYTESUR NEGATIVE 01/26/2015 0742   LEUKOCYTESUR Color  Interference due to blood in urine 01/22/2015 1018    STUDIES: No results found.  ASSESSMENT: 40 y.o. 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 (2.22 g/dL) and urine (145 mg/dL), bone marrow biopsy 02/03/2014 showing an average  12% plasmacytosis, with some sheets of abnormal plasma cells noted, cytogenetics pending; baseline beta 2 microglobulin was 7.36, baseline creatinine 1.64, with GFR 51, calcium on general 05/21/2014 was 10.8, LDH was normal  (1) Multiple myeloma stage IIA diagnosed January 2016;        I: CyBorD started January 2016  (a) dexamethasone 20 mg/d every TU/WED started 02/04/2014  (b) bortezomib sQ days 1,4,8,11 of every 21 day cycle started 02/10/2014  (c) cyclophosphamide 300 mg/M2 IV weekly started 02/13/2014       2: Lenalidomide 1m daily started 07/26/2014  (2) chronic kidney disease stage III  (3) hypercalcemia: zolendronate started 02/10/2014-- repeat every 4 weeks initially, then every 12 weeks  (a) changed to denosumab/Xgeva starting 02/26/2015 because of CKD  (4) ID prophylaxis:  (a) influenza and Pneumovax [PV13] vaccines 02/07/2014  (b) acyclovir started 02/07/2014  (c) HIV negative 01/31/2014  (d) Pneumovax P23 too be given after March 2016  (5). The patient is not a transplant candidate for financial reasons  (6) bilateral foot rash: Started on Septra DS and Diflucan 09/03/2014, to be continued for 10 days.  (7) Multiple Myeloma relapse in December 2016.   (a) starting 02/05/15: dexamethasone 239mx2 consecutive days weekly; bortezomib sQ weekly,   (b) Septra DS twice daily  (c) acyclovir 40036maily    PLAN: Iver is feeling poorly this week. His light chains have been trending upwards, but kidney function is normal. I discussed his case with Dr. MagJana Hakime suggested holding treatment this week and reevaluating in the future.   I reminded him about the  appointment at [facility] at 4/7 at 2pm to discuss and investigate his hematuria.  Elick will return on 4/5 for follow up with Dr. MagJana Hakimn that day they will discuss a treatment plan. He understands and agrees with this. He knows the goal of treatment in his case is remission. He has been encouraged to call with any issues that might arise before his next visit here.  HeaLaurie PandaP   04/23/2015 2:17 PM

## 2015-04-23 NOTE — Telephone Encounter (Signed)
appt made and avs printed °

## 2015-04-30 ENCOUNTER — Ambulatory Visit: Payer: Self-pay

## 2015-04-30 ENCOUNTER — Other Ambulatory Visit: Payer: Self-pay

## 2015-05-06 ENCOUNTER — Ambulatory Visit: Payer: Self-pay

## 2015-05-06 ENCOUNTER — Ambulatory Visit (HOSPITAL_BASED_OUTPATIENT_CLINIC_OR_DEPARTMENT_OTHER): Payer: Self-pay

## 2015-05-06 ENCOUNTER — Telehealth: Payer: Self-pay | Admitting: Oncology

## 2015-05-06 ENCOUNTER — Ambulatory Visit (HOSPITAL_BASED_OUTPATIENT_CLINIC_OR_DEPARTMENT_OTHER): Payer: Self-pay | Admitting: Oncology

## 2015-05-06 ENCOUNTER — Other Ambulatory Visit (HOSPITAL_BASED_OUTPATIENT_CLINIC_OR_DEPARTMENT_OTHER): Payer: Self-pay

## 2015-05-06 VITALS — BP 150/93 | HR 86 | Temp 98.2°F | Resp 18 | Ht 63.0 in | Wt 147.8 lb

## 2015-05-06 DIAGNOSIS — C9002 Multiple myeloma in relapse: Secondary | ICD-10-CM

## 2015-05-06 DIAGNOSIS — D649 Anemia, unspecified: Secondary | ICD-10-CM

## 2015-05-06 DIAGNOSIS — M899 Disorder of bone, unspecified: Secondary | ICD-10-CM

## 2015-05-06 DIAGNOSIS — C9000 Multiple myeloma not having achieved remission: Secondary | ICD-10-CM

## 2015-05-06 DIAGNOSIS — G893 Neoplasm related pain (acute) (chronic): Secondary | ICD-10-CM

## 2015-05-06 DIAGNOSIS — C7951 Secondary malignant neoplasm of bone: Secondary | ICD-10-CM

## 2015-05-06 DIAGNOSIS — Z5112 Encounter for antineoplastic immunotherapy: Secondary | ICD-10-CM

## 2015-05-06 DIAGNOSIS — M549 Dorsalgia, unspecified: Secondary | ICD-10-CM

## 2015-05-06 DIAGNOSIS — R0781 Pleurodynia: Secondary | ICD-10-CM

## 2015-05-06 DIAGNOSIS — R51 Headache: Secondary | ICD-10-CM

## 2015-05-06 LAB — CBC WITH DIFFERENTIAL/PLATELET
BASO%: 0.1 % (ref 0.0–2.0)
BASOS ABS: 0 10*3/uL (ref 0.0–0.1)
EOS%: 0.6 % (ref 0.0–7.0)
Eosinophils Absolute: 0.1 10*3/uL (ref 0.0–0.5)
HEMATOCRIT: 28.5 % — AB (ref 38.4–49.9)
HEMOGLOBIN: 9.4 g/dL — AB (ref 13.0–17.1)
LYMPH#: 2.4 10*3/uL (ref 0.9–3.3)
LYMPH%: 20.9 % (ref 14.0–49.0)
MCH: 32.2 pg (ref 27.2–33.4)
MCHC: 33 g/dL (ref 32.0–36.0)
MCV: 97.6 fL (ref 79.3–98.0)
MONO#: 0.7 10*3/uL (ref 0.1–0.9)
MONO%: 5.8 % (ref 0.0–14.0)
NEUT%: 72.6 % (ref 39.0–75.0)
NEUTROS ABS: 8.3 10*3/uL — AB (ref 1.5–6.5)
Platelets: 505 10*3/uL — ABNORMAL HIGH (ref 140–400)
RBC: 2.92 10*6/uL — ABNORMAL LOW (ref 4.20–5.82)
RDW: 14.4 % (ref 11.0–14.6)
WBC: 11.5 10*3/uL — AB (ref 4.0–10.3)

## 2015-05-06 LAB — COMPREHENSIVE METABOLIC PANEL
ALBUMIN: 2.1 g/dL — AB (ref 3.5–5.0)
ALK PHOS: 68 U/L (ref 40–150)
ALT: 10 U/L (ref 0–55)
AST: 13 U/L (ref 5–34)
Anion Gap: 8 mEq/L (ref 3–11)
BUN: 21.1 mg/dL (ref 7.0–26.0)
CALCIUM: 10.1 mg/dL (ref 8.4–10.4)
CO2: 22 mEq/L (ref 22–29)
CREATININE: 1.7 mg/dL — AB (ref 0.7–1.3)
Chloride: 107 mEq/L (ref 98–109)
EGFR: 48 mL/min/{1.73_m2} — ABNORMAL LOW (ref >90)
GLUCOSE: 120 mg/dL (ref 70–140)
POTASSIUM: 4.1 meq/L (ref 3.5–5.1)
SODIUM: 137 meq/L (ref 136–145)
Total Bilirubin: <0.3 mg/dL (ref 0.20–1.20)
Total Protein: 7.6 g/dL (ref 6.4–8.3)

## 2015-05-06 MED ORDER — DEXAMETHASONE 4 MG PO TABS: 20.0000 mg | ORAL_TABLET | ORAL | Status: DC

## 2015-05-06 MED ORDER — BORTEZOMIB CHEMO SQ INJECTION 3.5 MG (2.5MG/ML)
1.3000 mg/m2 | Freq: Once | INTRAMUSCULAR | Status: AC
  Administered 2015-05-06: 2.25 mg via SUBCUTANEOUS
  Filled 2015-05-06: qty 2.25

## 2015-05-06 MED ORDER — ONDANSETRON HCL 8 MG PO TABS: 8.0000 mg | ORAL_TABLET | Freq: Three times a day (TID) | ORAL | Status: DC | PRN

## 2015-05-06 MED ORDER — DENOSUMAB 120 MG/1.7ML ~~LOC~~ SOLN
120.0000 mg | Freq: Once | SUBCUTANEOUS | Status: AC
  Administered 2015-05-06: 120 mg via SUBCUTANEOUS
  Filled 2015-05-06: qty 1.7

## 2015-05-06 MED ORDER — ONDANSETRON HCL 8 MG PO TABS
ORAL_TABLET | ORAL | Status: AC
  Filled 2015-05-06: qty 1

## 2015-05-06 MED ORDER — ONDANSETRON HCL 8 MG PO TABS
8.0000 mg | ORAL_TABLET | Freq: Once | ORAL | Status: AC
Start: 2015-05-06 — End: 2015-05-06
  Administered 2015-05-06: 8 mg via ORAL

## 2015-05-06 MED FILL — ONDANSETRON HCL 8 MG TABLET: 8 | 6 days supply | Qty: 20 | Fill #0

## 2015-05-06 NOTE — Patient Instructions (Signed)
[facility] Discharge Instructions for Patients Receiving Chemotherapy  Today you received the following chemotherapy agents Velcade. To help prevent nausea and vomiting after your treatment, we encourage you to take your nausea medication as directed.  If you develop nausea and vomiting that is not controlled by your nausea medication, call the clinic.   BELOW ARE SYMPTOMS THAT SHOULD BE REPORTED IMMEDIATELY:  *FEVER GREATER THAN 100.5 F  *CHILLS WITH OR WITHOUT FEVER  NAUSEA AND VOMITING THAT IS NOT CONTROLLED WITH YOUR NAUSEA MEDICATION  *UNUSUAL SHORTNESS OF BREATH  *UNUSUAL BRUISING OR BLEEDING  TENDERNESS IN MOUTH AND THROAT WITH OR WITHOUT PRESENCE OF ULCERS  *URINARY PROBLEMS  *BOWEL PROBLEMS  UNUSUAL RASH Items with * indicate a potential emergency and should be followed up as soon as possible.  Feel free to call the clinic you have any questions or concerns. The clinic phone number is [phone].  Please show the CHEMO ALERT CARD at check-in to the Emergency Department and triage nurse.    

## 2015-05-06 NOTE — Progress Notes (Signed)
OK to tx per Dr. Burr Medico with today's labs.

## 2015-05-06 NOTE — Telephone Encounter (Signed)
appt made and avs printed °

## 2015-05-06 NOTE — Progress Notes (Signed)
Here his progress   [place]  [phone] [phone]) [phone]    ID: Hartley Urton DOB: 02/20/1975  MR#: [id number]  BTD#:[id number]  Patient Care Team: Tresa Garter, MD as PCP - General (Internal Medicine) [name], NP as Nurse Practitioner (Hematology and Oncology) [name], MD as Consulting Physician (Oncology) PCP: [name], MD GYN: SU:  OTHER MD:  CHIEF COMPLAINT: multiple myeloma  CURRENT TREATMENT:   Denosumab/Xgeva; [place]field summated; Cytoxan; dexamethasone  HISTORY  OF MULTIPLE MYELOMA: From the original intake note January 2016:  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 The pain was worse with coughing. Exam was unremarkable, and he was treated with Mobic. No labs were obtained. On 01/30/2014 he presented to the emergency room at [facility] with similar complaints and also reported a 10 pound weight loss over the past month. A chest x-ray was significant only for mild atelectasis, but ACT NGO of the chest 01/30/2014, while it showed no pulmonary emboli, showed an osteolytic lesion destroying the manubrium of the sternum. There was extra osseous extension into the anterior mediastinum.  The patient was admitted and found to have a creatinine of 1.64 with a GFR of 51. Bilateral renal ultrasound showed no hydronephrosis or masses, and normal size and cortical thickness of both kidneys, though there was mild diffuse echogenicity. Urine total protein was 145 mg/dL and immunofixation showed a monoclonal IgG kappa protein as well as monoclonal free kappa light chains. On 01/31/2014 serum protein electrophoresis showed an M spike of 2.22 g/dL with a total protein of 8.3 and albumin 3.4. Kappa lambda light chains from the serum obtained 02/04/2014 showed 10.50 mg/dL on the, 1.74 in  the lambda, with an elevated ratio at 6.03. Beta-2 microglobulin was 7.36. LDH was normal at 184 and HIV antibody was nonreactive.  Biopsy of the manubrial mass 02/03/2014 showed extensive infiltration of the bone by sheets of atypical plasma cells, positive for CD138, kappa restricted (SZA 16-23). Right iliac bone marrow biopsy [name] 05/21/2014 (FZB 16-1) showed an average of 12% plasma cells with numerous aggregates and sheets of atypical plasma cells, which were kappa restricted. Cytogenetic analysis is pending  The patient's subsequent history is as detailed below  INTERVAL HISTORY: Tylerjames returns today for follow-up of his multiple myeloma, accompanied by his wife, [name]. Today is day 1, cycle 13 of his weekly bortezomib. He continues on dexamethasone weekly (20 mg daily 2 days in a row)  REVIEW OF SYSTEMS: Jarold is not doing well. He complains of more pain, including headaches, backaches, and pains in his rib. He has lost some weight, he thinks. His appetite is poor. He is food does not taste right. He feels weak. He is sleeping a lot. He has had no fever, rash, or bleeding problems. He denies visual changes, vomiting (though he does have mild nausea), falls, or gait imbalance. This been no cough or phlegm production. He is taking tramadol twice daily. There has been no constipation. A detailed review of systems today was otherwise stable  PAST MEDICAL HISTORY: Past Medical History  Diagnosis Date  . IgG myeloma ([facility] [place])   . CKD (chronic kidney disease), stage III   . Hypercholesterolemia   . Lytic bone lesions on xray     PAST SURGICAL HISTORY: No past surgical history on file.  FAMILY HISTORY Family History  Problem Relation Age of Onset  . Diabetes Father    The patient's parents are still living, in their late 53's. The patient has 2 brothers, 3 sisters. There is noc cancer in the fmaily to his knowledge.  SOCIAL HISTORY:  Originally from [facility], moved here 19  years ago. He works in Nurse, adult. Married (wife's name is [name]), lives in [place]. He has 5 children ages 93, 40, 23, 107 and 29 months [name], [name], [name] and [place]) in good health. Best number to call him is [phone]    ADVANCED DIRECTIVES: not  In place   HEALTH MAINTENANCE: Social History  Substance Use Topics  . Smoking status: Never Smoker   . Smokeless tobacco: Never Used  . Alcohol Use: No     Colonoscopy:  PSA:  Bone density:  Lipid panel:  Allergies  Allergen Reactions  . Ciprofloxacin Rash  . Mobic [Meloxicam] Rash    Current Outpatient Prescriptions  Medication Sig Dispense Refill  . acyclovir (ZOVIRAX) 200 MG capsule Take 2 capsules (400 mg total) by mouth daily. 60 capsule 1  . dexamethasone (DECADRON) 4 MG tablet Tome 5 pastillas al desayuno cada martes y miercoles (take 5 tablets with breakfast every Tues and Wed) 50 tablet 11  . diphenhydrAMINE (SOMINEX) 25 MG tablet Take 25 mg by mouth at bedtime as needed for allergies. Reported on 04/23/2015    . fluconazole (DIFLUCAN) 100 MG tablet Take 1 tablet (100 mg total) by mouth daily. 10 tablet 0  . LORazepam (ATIVAN) 0.5 MG tablet TAKE 1 TABLET BY MOUTH AT BEDTIME AS NEEDED FOR ANXIETY (Patient not taking: Reported on 04/23/2015) 30 tablet 1  . ondansetron (ZOFRAN) 8 MG tablet Take 1 tablet (8 mg total) by mouth every 8 (eight) hours as needed for nausea or vomiting. (Patient not taking: Reported on 04/23/2015) 20 tablet 1  . pantoprazole (PROTONIX) 20 MG tablet Take 1 tablet (20 mg total) by mouth 2 (two) times daily. 60 tablet 3  . sulfamethoxazole-trimethoprim (BACTRIM DS,SEPTRA DS) 800-160 MG tablet Take 1 tablet by mouth 2 (two) times daily. 60 tablet 0  . traMADol (ULTRAM) 50 MG tablet Take 1 tablet (50 mg total) by mouth every 6 (six) hours as needed. 60 tablet 0   No current facility-administered medications for this visit.    OBJECTIVE: Young Latin man who appears  fatigued  Filed Vitals:   05/06/15 0947  BP: 150/93  Pulse: 86  Temp: 98.2 F (36.8 C)  Resp: 18     Body mass index is 26.19 kg/(m^2).    ECOG FS:2 - Symptomatic, <50% confined to bed   Sclerae unicteric, pupils round and equal Oropharynx clear and moist-- no thrush or other lesions No cervical or supraclavicular adenopathy Lungs no rales or rhonchi Heart regular rate and rhythm Abd soft, nontender, positive bowel sounds MSK mild diffuse spinal tenderness, no upper extremity lymphedema Neuro: nonfocal, well oriented, appropriate affect  Results for KOLTAN, PORTOCARRERO (MRN [id number]) as of 04/16/2015 17:09  Ref. Range 02/12/2015 14:33 03/12/2015 12:53 04/09/2015 15:34  Ig Kappa Free Light Chain Latest Ref Range: 3.30-19.40 mg/L 26.08 (H) 38.97 (H) 40.58 (H)  Ig Lambda Free Light Chain Latest Ref Range: 5.71-26.30 mg/L 10.15 6.30 5.94  Kappa/Lambda FluidC Ratio Latest Ref Range: 0.26-1.65  2.57 (H) 6.19 (H) 6.83 (H)     CMP     Component Value Date/Time   NA 143 04/16/2015 1347   NA 144 01/27/2015 0605   K 4.2 04/16/2015 1347  K 4.8 01/27/2015 0605   CL 117* 01/27/2015 0605   CO2 24 04/16/2015 1347   CO2 18* 01/27/2015 0605   GLUCOSE 132 04/16/2015 1347   GLUCOSE 116* 01/27/2015 0605   BUN 20.0 04/16/2015 1347   BUN 64* 01/27/2015 0605   CREATININE 1.3 04/16/2015 1347   CREATININE 3.03* 01/27/2015 0605   CALCIUM 9.3 04/16/2015 1347   CALCIUM 6.3* 01/27/2015 0605   PROT 7.1 04/16/2015 1347   PROT 6.6 04/09/2015 1534   PROT 4.8* 01/25/2015 0740   ALBUMIN 2.6* 04/16/2015 1347   ALBUMIN 1.6* 01/27/2015 0605   AST 18 04/16/2015 1347   AST 18 01/25/2015 0740   ALT 11 04/16/2015 1347   ALT 32 01/25/2015 0740   ALKPHOS 85 04/16/2015 1347   ALKPHOS 107 01/25/2015 0740   BILITOT <0.30 04/16/2015 1347   BILITOT 0.1* 01/25/2015 0740   GFRNONAA 24* 01/27/2015 0605   GFRAA 28* 01/27/2015 0605    INo results found for: SPEP, UPEP  Lab Results  Component Value Date   WBC  11.5* 05/06/2015   NEUTROABS 8.3* 05/06/2015   HGB 9.4* 05/06/2015   HCT 28.5* 05/06/2015   MCV 97.6 05/06/2015   PLT 505* 05/06/2015      Chemistry      Component Value Date/Time   NA 143 04/16/2015 1347   NA 144 01/27/2015 0605   K 4.2 04/16/2015 1347   K 4.8 01/27/2015 0605   CL 117* 01/27/2015 0605   CO2 24 04/16/2015 1347   CO2 18* 01/27/2015 0605   BUN 20.0 04/16/2015 1347   BUN 64* 01/27/2015 0605   CREATININE 1.3 04/16/2015 1347   CREATININE 3.03* 01/27/2015 0605      Component Value Date/Time   CALCIUM 9.3 04/16/2015 1347   CALCIUM 6.3* 01/27/2015 0605   ALKPHOS 85 04/16/2015 1347   ALKPHOS 107 01/25/2015 0740   AST 18 04/16/2015 1347   AST 18 01/25/2015 0740   ALT 11 04/16/2015 1347   ALT 32 01/25/2015 0740   BILITOT <0.30 04/16/2015 1347   BILITOT 0.1* 01/25/2015 0740       No results found for: LABCA2  No components found for: TDHRC163  No results for input(s): INR in the last 168 hours.  Urinalysis    Component Value Date/Time   COLORURINE YELLOW 01/26/2015 0742   APPEARANCEUR CLOUDY* 01/26/2015 0742   LABSPEC 1.014 01/26/2015 0742   LABSPEC 1.030 01/22/2015 1018   PHURINE 5.0 01/26/2015 0742   PHURINE 5.0 01/22/2015 1018   GLUCOSEU NEGATIVE 01/26/2015 0742   GLUCOSEU Negative 01/22/2015 1018   HGBUR LARGE* 01/26/2015 0742   HGBUR Large 01/22/2015 1018   BILIRUBINUR NEGATIVE 01/26/2015 0742   BILIRUBINUR Negative 01/22/2015 1018   KETONESUR NEGATIVE 01/26/2015 0742   KETONESUR Negative 01/22/2015 1018   PROTEINUR 30* 01/26/2015 0742   PROTEINUR 2000 01/22/2015 1018   UROBILINOGEN 0.2 01/22/2015 1018   UROBILINOGEN 0.2 01/31/2014 1050   NITRITE NEGATIVE 01/26/2015 0742   NITRITE Negative 01/22/2015 1018   LEUKOCYTESUR NEGATIVE 01/26/2015 0742   LEUKOCYTESUR Color Interference due to blood in urine 01/22/2015 1018    STUDIES: No results found.  ASSESSMENT: 40 y.o. Spanish speaker presenting January 2016 with bony pain, multiple  lytic lesions, and monoclonal IgG kappa paraprotein in serum (2.22 g/dL) and urine (145 mg/dL), bone marrow biopsy 02/03/2014 showing an average 12% plasmacytosis, with some sheets of abnormal plasma cells noted, cytogenetics pending; baseline beta 2 microglobulin was 7.36, baseline creatinine 1.64, with GFR 51, calcium on general 05/21/2014  was 10.8, LDH was normal  (1) Multiple myeloma stage IIA diagnosed January 2016;        I: CyBorD started January 2016  (a) dexamethasone 20 mg/d every TU/WED started 02/04/2014  (b) bortezomib sQ days 1,4,8,11 of every 21 day cycle started 02/10/2014  (c) cyclophosphamide 300 mg/M2 IV weekly started 02/13/2014       2: Lenalidomide 23m daily started 07/26/2014  (2) chronic kidney disease stage III  (3) hypercalcemia: zolendronate started 02/10/2014-- repeat every 4 weeks initially, then every 12 weeks  (a) changed to denosumab/Xgeva starting 02/26/2015 because of CKD  (4) ID prophylaxis:  (a) influenza and Pneumovax [PV13] vaccines 02/07/2014  (b) acyclovir started 02/07/2014  (c) HIV negative 01/31/2014  (d) Pneumovax P23 too be given after March 2016  (5). The patient is not a transplant candidate for financial reasons  (6) bilateral foot rash: Started on Septra DS and Diflucan 09/03/2014, to be continued for 10 days.  (7) Multiple Myeloma relapse in December 2016.   (a) starting 02/05/15: dexamethasone 276mx2 consecutive days weekly; bortezomib sQ weekly,   (b) Septra DS twice daily  (c) acyclovir 40011maily   (d) bortezomib/dexamethasone discontinued after 05/06/2015 dose with evidence of progression  (8) to start carfilzomib 05/14/2015, given with monthly low dose Cytoxan and weekly low dose dexamethasone   PLAN: Ova's myeloma is at best moderately controlled on the current treatment. However clinically he is declining. He has more pain and more anemia. I think we need to change and specifically intensify his treatment.  I'm going to  switch him to car feel Zometa which is given 2 days in a row for 3 weeks and then off the fourth week. I'm going to add low-dose Cytoxan to the day 1 treatment of each cycle and this will be repeated every 28 days. He will be on dexamethasone 20 mg once a week.  I'm hoping with this regimen he will be able to obtain better control of his disease. At some point we will try again to put him on maintenance therapy.  His wife is beginning to think that it might be a good idea for him to go to [place]. She understands they will have to take-there but she tells me she believes it is less expensive than here. That may be the case. I suggest that it would take him a good 3 months to get their get evaluated get all the pretransplant stuff done, get the actual transplant, and then recovered sufficiently [place]. They do have some family not too far away from [place] apparently and they can start these arrangements. I do not expect anything soon however.  As far as his pain is concerned I suggested he increase the tramadol 250 mg 4 times a day. We can go to 100 mg 4 times a day if necessary.  A note of call  for any problems that may develop before the next visit here.  MAGChauncey CruelD   05/06/2015 10:04 AM

## 2015-05-07 ENCOUNTER — Ambulatory Visit: Payer: Self-pay

## 2015-05-07 ENCOUNTER — Other Ambulatory Visit: Payer: Self-pay

## 2015-05-07 MED FILL — DEXAMETHASONE 4 MG TABLET: 4 | 56 days supply | Qty: 40 | Fill #0

## 2015-05-14 ENCOUNTER — Telehealth: Payer: Self-pay | Admitting: Nurse Practitioner

## 2015-05-14 ENCOUNTER — Encounter: Payer: Self-pay | Admitting: Nurse Practitioner

## 2015-05-14 ENCOUNTER — Other Ambulatory Visit (HOSPITAL_BASED_OUTPATIENT_CLINIC_OR_DEPARTMENT_OTHER): Payer: Self-pay

## 2015-05-14 ENCOUNTER — Ambulatory Visit (HOSPITAL_BASED_OUTPATIENT_CLINIC_OR_DEPARTMENT_OTHER): Payer: Self-pay

## 2015-05-14 ENCOUNTER — Ambulatory Visit (HOSPITAL_BASED_OUTPATIENT_CLINIC_OR_DEPARTMENT_OTHER): Payer: Self-pay | Admitting: Nurse Practitioner

## 2015-05-14 ENCOUNTER — Other Ambulatory Visit: Payer: Self-pay | Admitting: Oncology

## 2015-05-14 VITALS — BP 133/69 | HR 65 | Temp 97.7°F | Resp 18 | Ht 63.0 in | Wt 151.9 lb

## 2015-05-14 DIAGNOSIS — E860 Dehydration: Secondary | ICD-10-CM

## 2015-05-14 DIAGNOSIS — C7951 Secondary malignant neoplasm of bone: Secondary | ICD-10-CM

## 2015-05-14 DIAGNOSIS — M899 Disorder of bone, unspecified: Secondary | ICD-10-CM

## 2015-05-14 DIAGNOSIS — C9002 Multiple myeloma in relapse: Secondary | ICD-10-CM

## 2015-05-14 DIAGNOSIS — C9000 Multiple myeloma not having achieved remission: Secondary | ICD-10-CM

## 2015-05-14 DIAGNOSIS — G893 Neoplasm related pain (acute) (chronic): Secondary | ICD-10-CM

## 2015-05-14 DIAGNOSIS — M549 Dorsalgia, unspecified: Secondary | ICD-10-CM

## 2015-05-14 DIAGNOSIS — N183 Chronic kidney disease, stage 3 (moderate): Secondary | ICD-10-CM

## 2015-05-14 DIAGNOSIS — Z5111 Encounter for antineoplastic chemotherapy: Secondary | ICD-10-CM

## 2015-05-14 DIAGNOSIS — R51 Headache: Secondary | ICD-10-CM

## 2015-05-14 LAB — CBC WITH DIFFERENTIAL/PLATELET
BASO%: 0 % (ref 0.0–2.0)
Basophils Absolute: 0 10*3/uL (ref 0.0–0.1)
EOS%: 0 % (ref 0.0–7.0)
Eosinophils Absolute: 0 10*3/uL (ref 0.0–0.5)
HCT: 25.4 % — ABNORMAL LOW (ref 38.4–49.9)
HGB: 8.3 g/dL — ABNORMAL LOW (ref 13.0–17.1)
LYMPH%: 3.6 % — AB (ref 14.0–49.0)
MCH: 31.9 pg (ref 27.2–33.4)
MCHC: 32.7 g/dL (ref 32.0–36.0)
MCV: 97.7 fL (ref 79.3–98.0)
MONO#: 0.5 10*3/uL (ref 0.1–0.9)
MONO%: 2.6 % (ref 0.0–14.0)
NEUT#: 18.4 10*3/uL — ABNORMAL HIGH (ref 1.5–6.5)
NEUT%: 93.8 % — AB (ref 39.0–75.0)
PLATELETS: 516 10*3/uL — AB (ref 140–400)
RBC: 2.6 10*6/uL — ABNORMAL LOW (ref 4.20–5.82)
RDW: 14.1 % (ref 11.0–14.6)
WBC: 19.6 10*3/uL — ABNORMAL HIGH (ref 4.0–10.3)
lymph#: 0.7 10*3/uL — ABNORMAL LOW (ref 0.9–3.3)

## 2015-05-14 LAB — COMPREHENSIVE METABOLIC PANEL
ALT: 12 U/L (ref 0–55)
ANION GAP: 10 meq/L (ref 3–11)
AST: 15 U/L (ref 5–34)
Albumin: 2 g/dL — ABNORMAL LOW (ref 3.5–5.0)
Alkaline Phosphatase: 91 U/L (ref 40–150)
BUN: 29.6 mg/dL — ABNORMAL HIGH (ref 7.0–26.0)
CHLORIDE: 112 meq/L — AB (ref 98–109)
CO2: 17 meq/L — AB (ref 22–29)
CREATININE: 2.4 mg/dL — AB (ref 0.7–1.3)
Calcium: 8.5 mg/dL (ref 8.4–10.4)
EGFR: 32 mL/min/{1.73_m2} — ABNORMAL LOW (ref >90)
Glucose: 133 mg/dl (ref 70–140)
POTASSIUM: 4.4 meq/L (ref 3.5–5.1)
Sodium: 140 mEq/L (ref 136–145)
Total Bilirubin: <0.3 mg/dL (ref 0.20–1.20)
Total Protein: 7.8 g/dL (ref 6.4–8.3)

## 2015-05-14 MED ORDER — SODIUM CHLORIDE 0.9 % IV SOLN
Freq: Once | INTRAVENOUS | Status: AC
  Administered 2015-05-14: 16:00:00 via INTRAVENOUS

## 2015-05-14 MED ORDER — SODIUM CHLORIDE 0.9 % IV SOLN
Freq: Once | INTRAVENOUS | Status: AC
  Administered 2015-05-14: 15:00:00 via INTRAVENOUS

## 2015-05-14 MED ORDER — SODIUM CHLORIDE 0.9 % IV SOLN
400.0000 mg/m2 | Freq: Once | INTRAVENOUS | Status: AC
  Administered 2015-05-14: 700 mg via INTRAVENOUS
  Filled 2015-05-14: qty 35

## 2015-05-14 MED ORDER — PROCHLORPERAZINE MALEATE 10 MG PO TABS
10.0000 mg | ORAL_TABLET | Freq: Once | ORAL | Status: AC
  Administered 2015-05-14: 10 mg via ORAL

## 2015-05-14 MED ORDER — PROCHLORPERAZINE MALEATE 10 MG PO TABS
ORAL_TABLET | ORAL | Status: AC
  Filled 2015-05-14: qty 1

## 2015-05-14 NOTE — Patient Instructions (Signed)
[facility] Discharge Instructions for Patients Receiving Chemotherapy  Today you received the following chemotherapy agents Cytoxan.Dehydration, Adult Dehydration is a condition in which you do not have enough fluid or water in your body. It happens when you take in less fluid than you lose. Vital organs such as the kidneys, brain, and heart cannot function without a proper amount of fluids. Any loss of fluids from the body can cause dehydration.  Dehydration can range from mild to severe. This condition should be treated right away to help prevent it from becoming severe. CAUSES  This condition may be caused by:  Vomiting.  Diarrhea.  Excessive sweating, such as when exercising in hot or humid weather.  Not drinking enough fluid during strenuous exercise or during an illness.  Excessive urine output.  Fever.  Certain medicines. RISK FACTORS This condition is more likely to develop in:  People who are taking certain medicines that cause the body to lose excess fluid (diuretics).   People who have a chronic illness, such as diabetes, that may increase urination.  Older adults.   People who live at high altitudes.   People who participate in endurance sports.  SYMPTOMS  Mild Dehydration  Thirst.  Dry lips.  Slightly dry mouth.  Dry, warm skin. Moderate Dehydration  Very dry mouth.   Muscle cramps.   Dark urine and decreased urine production.   Decreased tear production.   Headache.   Light-headedness, especially when you stand up from a sitting position.  Severe Dehydration  Changes in skin.   Cold and clammy skin.   Skin does not spring back quickly when lightly pinched and released.   Changes in body fluids.   Extreme thirst.   No tears.   Not able to sweat when body temperature is high, such as in hot weather.   Minimal urine production.   Changes in vital signs.   Rapid, weak pulse (more than 100 beats  per minute when you are sitting still).   Rapid breathing.   Low blood pressure.   Other changes.   Sunken eyes.   Cold hands and feet.   Confusion.  Lethargy and difficulty being awakened.  Fainting (syncope).   Short-term weight loss.   Unconsciousness. DIAGNOSIS  This condition may be diagnosed based on your symptoms. You may also have tests to determine how severe your dehydration is. These tests may include:   Urine tests.   Blood tests.  TREATMENT  Treatment for this condition depends on the severity. Mild or moderate dehydration can often be treated at home. Treatment should be started right away. Do not wait until dehydration becomes severe. Severe dehydration needs to be treated at the hospital. Treatment for Mild Dehydration  Drinking plenty of water to replace the fluid you have lost.   Replacing minerals in your blood (electrolytes) that you may have lost.  Treatment for Moderate Dehydration  Consuming oral rehydration solution (ORS). Treatment for Severe Dehydration  Receiving fluid through an IV tube.   Receiving electrolyte solution through a feeding tube that is passed through your nose and into your stomach (nasogastric tube or NG tube).  Correcting any abnormalities in electrolytes. HOME CARE INSTRUCTIONS   Drink enough fluid to keep your urine clear or pale yellow.   Drink water or fluid slowly by taking small sips. You can also try sucking on ice cubes.  Have food or beverages that contain electrolytes. Examples include bananas and sports drinks.  Take over-the-counter and prescription medicines only  as told by your health care provider.   Prepare ORS according to the manufacturer's instructions. Take sips of ORS every 5 minutes until your urine returns to normal.  If you have vomiting or diarrhea, continue to try to drink water, ORS, or both.   If you have diarrhea, avoid:   Beverages that contain caffeine.   Fruit  juice.   Milk.   Carbonated soft drinks.  Do not take salt tablets. This can lead to the condition of having too much sodium in your body (hypernatremia).  SEEK MEDICAL CARE IF:  You cannot eat or drink without vomiting.  You have had moderate diarrhea during a period of more than 24 hours.  You have a fever. SEEK IMMEDIATE MEDICAL CARE IF:   You have extreme thirst.  You have severe diarrhea.  You have not urinated in 6-8 hours, or you have urinated only a small amount of very dark urine.  You have shriveled skin.  You are dizzy, confused, or both.   This information is not intended to replace advice given to you by your health care provider. Make sure you discuss any questions you have with your health care provider.   Document Released: 01/17/2005 Document Revised: 10/08/2014 Document Reviewed: 06/04/2014 Elsevier Interactive Patient Education 2016 [employer].   To help prevent nausea and vomiting after your treatment, we encourage you to take your nausea medication as directed.   If you develop nausea and vomiting that is not controlled by your nausea medication, call the clinic.   BELOW ARE SYMPTOMS THAT SHOULD BE REPORTED IMMEDIATELY:  *FEVER GREATER THAN 100.5 F  *CHILLS WITH OR WITHOUT FEVER  NAUSEA AND VOMITING THAT IS NOT CONTROLLED WITH YOUR NAUSEA MEDICATION  *UNUSUAL SHORTNESS OF BREATH  *UNUSUAL BRUISING OR BLEEDING  TENDERNESS IN MOUTH AND THROAT WITH OR WITHOUT PRESENCE OF ULCERS  *URINARY PROBLEMS  *BOWEL PROBLEMS  UNUSUAL RASH Items with * indicate a potential emergency and should be followed up as soon as possible.  Feel free to call the clinic you have any questions or concerns. The clinic phone number is [phone].  Please show the [facility] at check-in to the Emergency Department and triage nurse.

## 2015-05-14 NOTE — Telephone Encounter (Signed)
appt made and avs printed °

## 2015-05-14 NOTE — Progress Notes (Signed)
Here his progress   [place]  [phone]) [phone] [phone]) [phone]    ID: Nakai Pollio DOB: 06/21/1975  MR#: [id number]  UXL#:[id number]  Patient Care Team: Tresa Garter, MD as PCP - General (Internal Medicine) [name], NP as Nurse Practitioner (Hematology and Oncology) [name], MD as Consulting Physician (Oncology) PCP: [name], MD GYN: SU:  OTHER MD:  CHIEF COMPLAINT: multiple myeloma  CURRENT TREATMENT:   Denosumab/Xgeva; carfilzomib, cyclophosphamide, dexamethasone  HISTORY  OF MULTIPLE MYELOMA: From the original intake note January 2016:  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 The pain was worse with coughing. Exam was unremarkable, and he was treated with Mobic. No labs were obtained. On 01/30/2014 he presented to the emergency room at [facility] with similar complaints and also reported a 10 pound weight loss over the past month. A chest x-ray was significant only for mild atelectasis, but ACT NGO of the chest 01/30/2014, while it showed no pulmonary emboli, showed an osteolytic lesion destroying the manubrium of the sternum. There was extra osseous extension into the anterior mediastinum.  The patient was admitted and found to have a creatinine of 1.64 with a GFR of 51. Bilateral renal ultrasound showed no hydronephrosis or masses, and normal size and cortical thickness of both kidneys, though there was mild diffuse echogenicity. Urine total protein was 145 mg/dL and immunofixation showed a monoclonal IgG kappa protein as well as monoclonal free kappa light chains. On 01/31/2014 serum protein electrophoresis showed an M spike of 2.22 g/dL with a total protein of 8.3 and albumin 3.4. Kappa lambda light chains from the serum obtained 02/04/2014 showed 10.50 mg/dL on the, 1.74  in the lambda, with an elevated ratio at 6.03. Beta-2 microglobulin was 7.36. LDH was normal at 184 and HIV antibody was nonreactive.  Biopsy of the manubrial mass 02/03/2014 showed extensive infiltration of the bone by sheets of atypical plasma cells, positive for CD138, kappa restricted (SZA 16-23). Right iliac bone marrow biopsy [name] 05/21/2014 (FZB 16-1) showed an average of 12% plasma cells with numerous aggregates and sheets of atypical plasma cells, which were kappa restricted. Cytogenetic analysis is pending  The patient's subsequent history is as detailed below  INTERVAL HISTORY: Danna returns today for follow-up of his multiple myeloma, alone. Today he begins a new treatment, where cyclophosphamide is given every 4 weeks, and carfilzomib twice weekly for 3 weeks with 1 week off. He is also on oral dexamethasone 66m once weekly.  REVIEW OF SYSTEMS: Fitzgerald denies fevers or chills. He has mild nausea managed with zofran PRN. He moves his bowels every day. He has noticed that his urine is darker than usual, occasionally a light tea color. The pain to his upper back is increased. He is taking 52mtramadol twice daily. He has more frequent headaches. His appetite is good on the day he takes dexamethasone, but it quickly goes down after that. His energy is low and sometimes can feel weak. He denies shortness of breath, chest pain, cough, or palpitations. A detailed review of systems is otherwise stable.   PAST MEDICAL HISTORY: Past Medical History  Diagnosis Date  . IgG myeloma ([place]  . CKD (chronic kidney disease), stage III   . Hypercholesterolemia   . Lytic bone lesions on xray     PAST SURGICAL HISTORY: No past surgical  history on file.  FAMILY HISTORY Family History  Problem Relation Age of Onset  . Diabetes Father    The patient's parents are still living, in their late 77's. The patient has 2 brothers, 3 sisters. There is noc cancer in the fmaily to his  knowledge.  SOCIAL HISTORY:  Originally from [facility], moved here 19 years ago. He works in Nurse, adult. Married (wife's name is [name]), lives in [place]. He has 5 children ages 48, 18, 47, 46 and 51 months [name], [name], [name] and [place]) in good health. Best number to call him is [phone]    ADVANCED DIRECTIVES: not  In place   HEALTH MAINTENANCE: Social History  Substance Use Topics  . Smoking status: Never Smoker   . Smokeless tobacco: Never Used  . Alcohol Use: No     Colonoscopy:  PSA:  Bone density:  Lipid panel:  Allergies  Allergen Reactions  . Ciprofloxacin Rash  . Mobic [Meloxicam] Rash    Current Outpatient Prescriptions  Medication Sig Dispense Refill  . acyclovir (ZOVIRAX) 200 MG capsule Take 2 capsules (400 mg total) by mouth daily. 60 capsule 1  . dexamethasone (DECADRON) 4 MG tablet Tome 5 pastillas al desayuno cada martes y miercoles (take 5 tablets with breakfast every Tues and Wed) 50 tablet 11  . dexamethasone (DECADRON) 4 MG tablet Take 5 tablets (20 mg total) by mouth once a week. Take 5 tablets (62m) on days 1,8,15 and 22 of chemo. cycle 40 tablet 4  . diphenhydrAMINE (SOMINEX) 25 MG tablet Take 25 mg by mouth at bedtime as needed for allergies. Reported on 04/23/2015    . ondansetron (ZOFRAN) 8 MG tablet Take 1 tablet (8 mg total) by mouth every 8 (eight) hours as needed for nausea or vomiting. 20 tablet 1  . traMADol (ULTRAM) 50 MG tablet Take 1 tablet (50 mg total) by mouth every 6 (six) hours as needed. 60 tablet 0  . fluconazole (DIFLUCAN) 100 MG tablet Take 1 tablet (100 mg total) by mouth daily. (Patient not taking: Reported on 05/14/2015) 10 tablet 0  . pantoprazole (PROTONIX) 20 MG tablet Take 1 tablet (20 mg total) by mouth 2 (two) times daily. (Patient not taking: Reported on 05/14/2015) 60 tablet 3   No current facility-administered medications for this visit.    OBJECTIVE: Young Latin man who  appears fatigued  Filed Vitals:   05/14/15 1344  BP: 133/69  Pulse: 65  Temp: 97.7 F (36.5 C)  Resp: 18     Body mass index is 26.91 kg/(m^2).    ECOG FS:2 - Symptomatic, <50% confined to bed   Skin: warm, dry  HEENT: sclerae anicteric, conjunctivae pink, oropharynx clear. No thrush or mucositis.  Lymph Nodes: No cervical or supraclavicular lymphadenopathy  Lungs: clear to auscultation bilaterally, no rales, wheezes, or rhonci  Heart: regular rate and rhythm  Abdomen: round, soft, non tender, positive bowel sounds  Musculoskeletal: No focal spinal tenderness, no peripheral edema  Neuro: non focal, well oriented, positive affect   Results for JKAELUM, KISSICK(MRN [id number] as of 04/16/2015 17:09  Ref. Range 02/12/2015 14:33 03/12/2015 12:53 04/09/2015 15:34  Ig Kappa Free Light Chain Latest Ref Range: 3.30-19.40 mg/L 26.08 (H) 38.97 (H) 40.58 (H)  Ig Lambda Free Light Chain Latest Ref Range: 5.71-26.30 mg/L 10.15 6.30 5.94  Kappa/Lambda FluidC Ratio Latest Ref Range: 0.26-1.65  2.57 (H) 6.19 (H) 6.83 (H)     CMP     Component Value Date/Time   NA  140 05/14/2015 1325   NA 144 01/27/2015 0605   K 4.4 05/14/2015 1325   K 4.8 01/27/2015 0605   CL 117* 01/27/2015 0605   CO2 17* 05/14/2015 1325   CO2 18* 01/27/2015 0605   GLUCOSE 133 05/14/2015 1325   GLUCOSE 116* 01/27/2015 0605   BUN 29.6* 05/14/2015 1325   BUN 64* 01/27/2015 0605   CREATININE 2.4* 05/14/2015 1325   CREATININE 3.03* 01/27/2015 0605   CALCIUM 8.5 05/14/2015 1325   CALCIUM 6.3* 01/27/2015 0605   PROT 7.8 05/14/2015 1325   PROT 6.6 04/09/2015 1534   PROT 4.8* 01/25/2015 0740   ALBUMIN 2.0* 05/14/2015 1325   ALBUMIN 1.6* 01/27/2015 0605   AST 15 05/14/2015 1325   AST 18 01/25/2015 0740   ALT 12 05/14/2015 1325   ALT 32 01/25/2015 0740   ALKPHOS 91 05/14/2015 1325   ALKPHOS 107 01/25/2015 0740   BILITOT <0.30 05/14/2015 1325   BILITOT 0.1* 01/25/2015 0740   GFRNONAA 24* 01/27/2015 0605   GFRAA 28*  01/27/2015 0605    INo results found for: SPEP, UPEP  Lab Results  Component Value Date   WBC 19.6* 05/14/2015   NEUTROABS 18.4* 05/14/2015   HGB 8.3* 05/14/2015   HCT 25.4* 05/14/2015   MCV 97.7 05/14/2015   PLT 516* 05/14/2015      Chemistry      Component Value Date/Time   NA 140 05/14/2015 1325   NA 144 01/27/2015 0605   K 4.4 05/14/2015 1325   K 4.8 01/27/2015 0605   CL 117* 01/27/2015 0605   CO2 17* 05/14/2015 1325   CO2 18* 01/27/2015 0605   BUN 29.6* 05/14/2015 1325   BUN 64* 01/27/2015 0605   CREATININE 2.4* 05/14/2015 1325   CREATININE 3.03* 01/27/2015 0605      Component Value Date/Time   CALCIUM 8.5 05/14/2015 1325   CALCIUM 6.3* 01/27/2015 0605   ALKPHOS 91 05/14/2015 1325   ALKPHOS 107 01/25/2015 0740   AST 15 05/14/2015 1325   AST 18 01/25/2015 0740   ALT 12 05/14/2015 1325   ALT 32 01/25/2015 0740   BILITOT <0.30 05/14/2015 1325   BILITOT 0.1* 01/25/2015 0740       No results found for: LABCA2  No components found for: XBLTJ030  No results for input(s): INR in the last 168 hours.  Urinalysis    Component Value Date/Time   COLORURINE YELLOW 01/26/2015 0742   APPEARANCEUR CLOUDY* 01/26/2015 0742   LABSPEC 1.014 01/26/2015 0742   LABSPEC 1.030 01/22/2015 1018   PHURINE 5.0 01/26/2015 0742   PHURINE 5.0 01/22/2015 1018   GLUCOSEU NEGATIVE 01/26/2015 0742   GLUCOSEU Negative 01/22/2015 1018   HGBUR LARGE* 01/26/2015 0742   HGBUR Large 01/22/2015 1018   BILIRUBINUR NEGATIVE 01/26/2015 0742   BILIRUBINUR Negative 01/22/2015 1018   KETONESUR NEGATIVE 01/26/2015 0742   KETONESUR Negative 01/22/2015 1018   PROTEINUR 30* 01/26/2015 0742   PROTEINUR 2000 01/22/2015 1018   UROBILINOGEN 0.2 01/22/2015 1018   UROBILINOGEN 0.2 01/31/2014 1050   NITRITE NEGATIVE 01/26/2015 0742   NITRITE Negative 01/22/2015 1018   LEUKOCYTESUR NEGATIVE 01/26/2015 0742   LEUKOCYTESUR Color Interference due to blood in urine 01/22/2015 1018    STUDIES: No  results found.  ASSESSMENT: 40 y.o. 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 (2.22 g/dL) and urine (145 mg/dL), bone marrow biopsy 02/03/2014 showing an average 12% plasmacytosis, with some sheets of abnormal plasma cells noted, cytogenetics  pending; baseline beta 2 microglobulin was 7.36, baseline creatinine 1.64, with GFR 51, calcium on general 05/21/2014 was 10.8, LDH was normal  (1) Multiple myeloma stage IIA diagnosed January 2016;        I: CyBorD started January 2016  (a) dexamethasone 20 mg/d every TU/WED started 02/04/2014  (b) bortezomib sQ days 1,4,8,11 of every 21 day cycle started 02/10/2014  (c) cyclophosphamide 300 mg/M2 IV weekly started 02/13/2014       2: Lenalidomide 63m daily started 07/26/2014  (2) chronic kidney disease stage III  (3) hypercalcemia: zolendronate started 02/10/2014-- repeat every 4 weeks initially, then every 12 weeks  (a) changed to denosumab/Xgeva starting 02/26/2015 because of CKD  (4) ID prophylaxis:  (a) influenza and Pneumovax [PV13] vaccines 02/07/2014  (b) acyclovir started 02/07/2014  (c) HIV negative 01/31/2014  (d) Pneumovax P23 too be given after March 2016  (5). The patient is not a transplant candidate for financial reasons  (6) bilateral foot rash: Started on Septra DS and Diflucan 09/03/2014, to be continued for 10 days.  (7) Multiple Myeloma relapse in December 2016.   (a) starting 02/05/15: dexamethasone 24mx2 consecutive days weekly; bortezomib sQ weekly,   (b) Septra DS twice daily  (c) acyclovir 4009maily   (d) bortezomib/dexamethasone discontinued after 05/06/2015 dose with evidence of progression  (8) to start carfilzomib 05/14/2015, given with monthly low dose cyclophosphamide and weekly low dose dexamethasone   PLAN: The labs were reviewed in detail, and Masen's creatinine is elevated to 2.4. I consulted with Dr. MagJana Hakimd he  suggested holding the carfilzomib today and tomorrow. He will proceed with day 1 cyclophosphamide alone with 1L NS today instead.   I reminded [place] that tramadol is available for use every 6 hours if he wanted. So far he is only taking it twice daily, but his pain has been increased for the past few weeks.   Michaeljohn will return in 1 week with day 8 of treatment and follow up with Dr. MagJana Hakime understands and agrees with this plan. He has been encouraged to call with any issues that might arise before his next visit here.     HeaLaurie PandaP   05/14/2015 5:05 PM

## 2015-05-15 ENCOUNTER — Encounter: Payer: Self-pay | Admitting: Oncology

## 2015-05-15 LAB — BETA 2 MICROGLOBULIN, SERUM: BETA 2: 4.9 mg/L — AB (ref 0.6–2.4)

## 2015-05-15 LAB — KAPPA/LAMBDA LIGHT CHAINS
IG LAMBDA FREE LIGHT CHAIN: 5.8 mg/L (ref 5.71–26.30)
Ig Kappa Free Light Chain: 79.6 mg/L — ABNORMAL HIGH (ref 3.30–19.40)
KAPPA/LAMBDA FLC RATIO: 13.72 — AB (ref 0.26–1.65)

## 2015-05-15 NOTE — Progress Notes (Signed)
Left amgen asst app for kyprolis for dr to sign

## 2015-05-18 LAB — MULTIPLE MYELOMA PANEL, SERUM
ALBUMIN/GLOB SERPL: 0.5 — AB (ref 0.7–1.7)
ALPHA2 GLOB SERPL ELPH-MCNC: 1.7 g/dL — AB (ref 0.4–1.0)
Albumin SerPl Elph-Mcnc: 2.2 g/dL — ABNORMAL LOW (ref 2.9–4.4)
Alpha 1: 0.4 g/dL (ref 0.0–0.4)
B-GLOBULIN SERPL ELPH-MCNC: 0.7 g/dL (ref 0.7–1.3)
GAMMA GLOB SERPL ELPH-MCNC: 1.9 g/dL — AB (ref 0.4–1.8)
GLOBULIN, TOTAL: 4.7 g/dL — AB (ref 2.2–3.9)
IgA, Qn, Serum: 20 mg/dL — ABNORMAL LOW (ref 90–386)
IgM, Qn, Serum: 21 mg/dL (ref 20–172)
M PROTEIN SERPL ELPH-MCNC: 1.8 g/dL — AB
TOTAL PROTEIN: 6.9 g/dL (ref 6.0–8.5)

## 2015-05-20 ENCOUNTER — Other Ambulatory Visit: Payer: Self-pay | Admitting: *Deleted

## 2015-05-21 ENCOUNTER — Ambulatory Visit (HOSPITAL_BASED_OUTPATIENT_CLINIC_OR_DEPARTMENT_OTHER): Payer: Self-pay | Admitting: Oncology

## 2015-05-21 ENCOUNTER — Other Ambulatory Visit (HOSPITAL_BASED_OUTPATIENT_CLINIC_OR_DEPARTMENT_OTHER): Payer: Self-pay

## 2015-05-21 ENCOUNTER — Ambulatory Visit (HOSPITAL_BASED_OUTPATIENT_CLINIC_OR_DEPARTMENT_OTHER): Payer: Self-pay

## 2015-05-21 VITALS — BP 115/76 | HR 77 | Temp 98.4°F | Resp 18 | Wt 149.1 lb

## 2015-05-21 DIAGNOSIS — C9000 Multiple myeloma not having achieved remission: Secondary | ICD-10-CM

## 2015-05-21 DIAGNOSIS — C9002 Multiple myeloma in relapse: Secondary | ICD-10-CM

## 2015-05-21 DIAGNOSIS — M899 Disorder of bone, unspecified: Secondary | ICD-10-CM

## 2015-05-21 DIAGNOSIS — G893 Neoplasm related pain (acute) (chronic): Secondary | ICD-10-CM

## 2015-05-21 DIAGNOSIS — C7951 Secondary malignant neoplasm of bone: Secondary | ICD-10-CM

## 2015-05-21 DIAGNOSIS — N183 Chronic kidney disease, stage 3 (moderate): Secondary | ICD-10-CM

## 2015-05-21 LAB — COMPREHENSIVE METABOLIC PANEL
ANION GAP: 8 meq/L (ref 3–11)
AST: 14 U/L (ref 5–34)
Albumin: 1.9 g/dL — ABNORMAL LOW (ref 3.5–5.0)
Alkaline Phosphatase: 91 U/L (ref 40–150)
BUN: 30.3 mg/dL — ABNORMAL HIGH (ref 7.0–26.0)
CALCIUM: 8.5 mg/dL (ref 8.4–10.4)
CHLORIDE: 109 meq/L (ref 98–109)
CO2: 21 mEq/L — ABNORMAL LOW (ref 22–29)
CREATININE: 2.3 mg/dL — AB (ref 0.7–1.3)
EGFR: 34 mL/min/{1.73_m2} — ABNORMAL LOW (ref >90)
Glucose: 136 mg/dl (ref 70–140)
Potassium: 4.5 mEq/L (ref 3.5–5.1)
Sodium: 139 mEq/L (ref 136–145)
Total Bilirubin: <0.3 mg/dL (ref 0.20–1.20)
Total Protein: 7.6 g/dL (ref 6.4–8.3)

## 2015-05-21 LAB — CBC WITH DIFFERENTIAL/PLATELET
BASO%: 0.1 % (ref 0.0–2.0)
BASOS ABS: 0 10*3/uL (ref 0.0–0.1)
EOS ABS: 0 10*3/uL (ref 0.0–0.5)
EOS%: 0 % (ref 0.0–7.0)
HEMATOCRIT: 25.2 % — AB (ref 38.4–49.9)
HGB: 8.1 g/dL — ABNORMAL LOW (ref 13.0–17.1)
LYMPH#: 0.6 10*3/uL — AB (ref 0.9–3.3)
LYMPH%: 4.2 % — ABNORMAL LOW (ref 14.0–49.0)
MCH: 32 pg (ref 27.2–33.4)
MCHC: 32.1 g/dL (ref 32.0–36.0)
MCV: 99.8 fL — AB (ref 79.3–98.0)
MONO#: 0.3 10*3/uL (ref 0.1–0.9)
MONO%: 2.1 % (ref 0.0–14.0)
NEUT#: 13.3 10*3/uL — ABNORMAL HIGH (ref 1.5–6.5)
NEUT%: 93.6 % — AB (ref 39.0–75.0)
PLATELETS: 560 10*3/uL — AB (ref 140–400)
RBC: 2.52 10*6/uL — ABNORMAL LOW (ref 4.20–5.82)
RDW: 14.5 % (ref 11.0–14.6)
WBC: 14.2 10*3/uL — ABNORMAL HIGH (ref 4.0–10.3)

## 2015-05-21 MED ORDER — PROCHLORPERAZINE MALEATE 10 MG PO TABS: 10.0000 mg | ORAL_TABLET | Freq: Four times a day (QID) | ORAL | Status: DC | PRN

## 2015-05-21 MED ORDER — TRAMADOL HCL 50 MG PO TABS: 50.0000 mg | ORAL_TABLET | Freq: Four times a day (QID) | ORAL | Status: DC | PRN

## 2015-05-21 MED ORDER — CARFILZOMIB CHEMO INJECTION 60 MG: 20.0000 mg/m2 | Freq: Once | INTRAVENOUS | Status: DC

## 2015-05-21 MED ORDER — SODIUM CHLORIDE 0.9 % IV SOLN
Freq: Once | INTRAVENOUS | Status: DC
  Administered 2015-05-21: 15:00:00 via INTRAVENOUS

## 2015-05-21 MED ORDER — PROCHLORPERAZINE MALEATE 10 MG PO TABS: 10.0000 mg | ORAL_TABLET | Freq: Once | ORAL | Status: DC

## 2015-05-21 MED ORDER — SODIUM CHLORIDE 0.9 % IV SOLN: 10.0000 mg | Freq: Once | INTRAVENOUS | Status: DC

## 2015-05-21 MED ORDER — SODIUM CHLORIDE 0.9 % IV SOLN: Freq: Once | INTRAVENOUS | Status: DC

## 2015-05-21 MED ORDER — ACYCLOVIR 200 MG PO CAPS: 400.0000 mg | ORAL_CAPSULE | Freq: Every day | ORAL | Status: DC

## 2015-05-21 MED FILL — ACYCLOVIR 400 MG TABLET: 400 | 30 days supply | Qty: 30 | Fill #0

## 2015-05-21 MED FILL — PROCHLORPERAZINE 10 MG TAB: 10 | 15 days supply | Qty: 60 | Fill #0

## 2015-05-21 MED FILL — traMADol HCL 50 MG TABS: 50 | 15 days supply | Qty: 60 | Fill #0

## 2015-05-21 NOTE — Progress Notes (Signed)
Pt saw Dr. Jana Hakim prior to chemo today.  OK to treat with all lab results today as per md. Pt did not receive Kyprolis today due to chemo regimen not approved yet.  Raquel, care management notified and is working on approval status with drug company assistance for pt.   Alonna Minium, pharmacists notifed.  Explanations given to pt.  Instructed pt not to come in on Fri 4/21.  Informed pt that Dr. Virgie Dad nurse will follow up with care management for approval status.  Pt understood that he will be rescheduled for chemo when pt's assistance status is finalized. Ubaldo Glassing, clinical nurse supervisor notified Dr. Jana Hakim of above.  No new order received.

## 2015-05-21 NOTE — Progress Notes (Signed)
Here his progress   [place]  [phone] [phone]) [phone]    ID: Charles [name] DOB: 1975-06-11  MR#: [id number]  KGY#:[id number]  Patient Care Team: Tresa Garter, MD as PCP - General (Internal Medicine) [name], NP as Nurse Practitioner (Hematology and Oncology) [name], MD as Consulting Physician (Oncology) PCP: [name], MD GYN: SU:  OTHER MD:  CHIEF COMPLAINT: multiple myeloma  CURRENT TREATMENT:   Denosumab/Xgeva; carfilzomib, cyclophosphamide, dexamethasone  HISTORY  OF MULTIPLE MYELOMA: From the original intake note January 2016:  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 The pain was worse with coughing. Exam was unremarkable, and he was treated with Mobic. No labs were obtained. On 01/30/2014 he presented to the emergency room at [facility] with similar complaints and also reported a 10 pound weight loss over the past month. A chest x-ray was significant only for mild atelectasis, but ACT NGO of the chest 01/30/2014, while it showed no pulmonary emboli, showed an osteolytic lesion destroying the manubrium of the sternum. There was extra osseous extension into the anterior mediastinum.  The patient was admitted and found to have a creatinine of 1.64 with a GFR of 51. Bilateral renal ultrasound showed no hydronephrosis or masses, and normal size and cortical thickness of both kidneys, though there was mild diffuse echogenicity. Urine total protein was 145 mg/dL and immunofixation showed a monoclonal IgG kappa protein as well as monoclonal free kappa light chains. On 01/31/2014 serum protein electrophoresis showed an M spike of 2.22 g/dL with a total protein of 8.3 and albumin 3.4. Kappa lambda light chains from the serum obtained 02/04/2014 showed 10.50 mg/dL on the, 1.74  in the lambda, with an elevated ratio at 6.03. Beta-2 microglobulin was 7.36. LDH was normal at 184 and HIV antibody was nonreactive.  Biopsy of the manubrial mass 02/03/2014 showed extensive infiltration of the bone by sheets of atypical plasma cells, positive for CD138, kappa restricted (SZA 16-23). Right iliac bone marrow biopsy [name] 05/21/2014 (FZB 16-1) showed an average of 12% plasma cells with numerous aggregates and sheets of atypical plasma cells, which were kappa restricted. Cytogenetic analysis is pending  The patient's subsequent history is as detailed below  INTERVAL HISTORY: Charles [name] was supposed to have started his car fills omitted last week but for a variety of reasons that did not happen. Instead he received Cytoxan alone. Perhaps it is a lucky thing that that was the case because he tolerated the Cytoxan very poorly. He was severely fatigued, had nausea and vomiting for at least 3 days, and lost his appetite. He did not have problems with fever or rash or bleeding and did not have any hematuria or problems with his urine. Today he feels better partly because he took steroids yesterday and today. He is also making sure to hydrate himself.  REVIEW OF SYSTEMS: Today he denies bony pain. He denies cough phlegm production or pleurisy. There are no mouth sores or pain on swallowing area that been no unusual headaches, visual changes, dizziness, or gait imbalance. A detailed review of systems was otherwise stable   PAST MEDICAL HISTORY: Past Medical History  Diagnosis Date  . IgG myeloma ([place])   . CKD (chronic kidney disease), stage III   . Hypercholesterolemia   . Lytic bone lesions on xray     PAST SURGICAL  HISTORY: No past surgical history on file.  FAMILY HISTORY Family History  Problem Relation Age of Onset  . Diabetes Father    The patient's parents are still living, in their late 12's. The patient has 2 brothers, 3 sisters. There is noc cancer in the fmaily to his  knowledge.  SOCIAL HISTORY:  Originally from [facility], moved here 19 years ago. He works in Nurse, adult. Married (wife's name is Charles [name]), lives in [place]. He has 5 children ages 19, 35, 34, 43 and 73 months [name], [name], [name] and [place]) in good health. Best number to call him is [phone]    ADVANCED DIRECTIVES: not  In place   HEALTH MAINTENANCE: Social History  Substance Use Topics  . Smoking status: Never Smoker   . Smokeless tobacco: Never Used  . Alcohol Use: No     Colonoscopy:  PSA:  Bone density:  Lipid panel:  Allergies  Allergen Reactions  . Ciprofloxacin Rash  . Mobic [Meloxicam] Rash    Current Outpatient Prescriptions  Medication Sig Dispense Refill  . acyclovir (ZOVIRAX) 200 MG capsule Take 2 capsules (400 mg total) by mouth daily. 60 capsule 6  . dexamethasone (DECADRON) 4 MG tablet Take 5 tablets (20 mg total) by mouth once a week. Take 5 tablets (27m) on days 1,8,15 and 22 of chemo. cycle 40 tablet 4  . prochlorperazine (COMPAZINE) 10 MG tablet Take 1 tablet (10 mg total) by mouth every 6 (six) hours as needed for nausea or vomiting. 60 tablet 6  . traMADol (ULTRAM) 50 MG tablet Take 1 tablet (50 mg total) by mouth every 6 (six) hours as needed. 60 tablet 5   No current facility-administered medications for this visit.    OBJECTIVE: Young Latin man who appearsStated age F56Vitals:   05/21/15 1331  BP: 115/76  Pulse: 77  Temp: 98.4 F (36.9 C)  Resp: 18     Body mass index is 26.42 kg/(m^2).    ECOG FS:2 - Symptomatic, <50% confined to bed   Sclerae unicteric, pupils round and equal Oropharynx clear and moist-- no thrush or other lesions No cervical or supraclavicular adenopathy Lungs no rales or rhonchi Heart regular rate and rhythm Abd soft, nontender, positive bowel sounds MSK no focal spinal tenderness, no upper extremity lymphedema Neuro: nonfocal, well oriented, appropriate  affect   Results for JALAMIN, MCCUISTON(MRN [id number] as of 05/21/2015 14:11  Ref. Range 01/23/2015 13:30 02/12/2015 14:33 03/12/2015 12:53 04/09/2015 15:34 05/14/2015 13:25  M Protein SerPl Elph-Mcnc Latest Ref Range: Not Observed g/dL  0.5 (H) 1.1 (H) 1.2 (H) 1.8 (H)    Kappa/Lambda FluidC Ratio 0.26 - 1.65  13.72 (H) 6.83 (H) 6.19 (H)          CMP     Component Value Date/Time   NA 139 05/21/2015 1259   NA 144 01/27/2015 0605   K 4.5 05/21/2015 1259   K 4.8 01/27/2015 0605   CL 117* 01/27/2015 0605   CO2 21* 05/21/2015 1259   CO2 18* 01/27/2015 0605   GLUCOSE 136 05/21/2015 1259   GLUCOSE 116* 01/27/2015 0605   BUN 30.3* 05/21/2015 1259   BUN 64* 01/27/2015 0605   CREATININE 2.3* 05/21/2015 1259   CREATININE 3.03* 01/27/2015 0605   CALCIUM 8.5 05/21/2015 1259   CALCIUM 6.3* 01/27/2015 0605   PROT 7.6 05/21/2015 1259   PROT 6.9 05/14/2015 1325   PROT 4.8* 01/25/2015 0740   ALBUMIN 1.9* 05/21/2015 1259   ALBUMIN 1.6* 01/27/2015 06754  AST 14 05/21/2015 1259   AST 18 01/25/2015 0740   ALT <9 05/21/2015 1259   ALT 32 01/25/2015 0740   ALKPHOS 91 05/21/2015 1259   ALKPHOS 107 01/25/2015 0740   BILITOT <0.30 05/21/2015 1259   BILITOT 0.1* 01/25/2015 0740   GFRNONAA 24* 01/27/2015 0605   GFRAA 28* 01/27/2015 0605    INo results found for: SPEP, UPEP  Lab Results  Component Value Date   WBC 14.2* 05/21/2015   NEUTROABS 13.3* 05/21/2015   HGB 8.1* 05/21/2015   HCT 25.2* 05/21/2015   MCV 99.8* 05/21/2015   PLT 560* 05/21/2015      Chemistry      Component Value Date/Time   NA 139 05/21/2015 1259   NA 144 01/27/2015 0605   K 4.5 05/21/2015 1259   K 4.8 01/27/2015 0605   CL 117* 01/27/2015 0605   CO2 21* 05/21/2015 1259   CO2 18* 01/27/2015 0605   BUN 30.3* 05/21/2015 1259   BUN 64* 01/27/2015 0605   CREATININE 2.3* 05/21/2015 1259   CREATININE 3.03* 01/27/2015 0605      Component Value Date/Time   CALCIUM 8.5 05/21/2015 1259   CALCIUM 6.3* 01/27/2015  0605   ALKPHOS 91 05/21/2015 1259   ALKPHOS 107 01/25/2015 0740   AST 14 05/21/2015 1259   AST 18 01/25/2015 0740   ALT <9 05/21/2015 1259   ALT 32 01/25/2015 0740   BILITOT <0.30 05/21/2015 1259   BILITOT 0.1* 01/25/2015 0740       No results found for: LABCA2  No components found for: UKGUR427  No results for input(s): INR in the last 168 hours.  Urinalysis    Component Value Date/Time   COLORURINE YELLOW 01/26/2015 0742   APPEARANCEUR CLOUDY* 01/26/2015 0742   LABSPEC 1.014 01/26/2015 0742   LABSPEC 1.030 01/22/2015 1018   PHURINE 5.0 01/26/2015 0742   PHURINE 5.0 01/22/2015 1018   GLUCOSEU NEGATIVE 01/26/2015 0742   GLUCOSEU Negative 01/22/2015 1018   HGBUR LARGE* 01/26/2015 0742   HGBUR Large 01/22/2015 1018   BILIRUBINUR NEGATIVE 01/26/2015 0742   BILIRUBINUR Negative 01/22/2015 1018   KETONESUR NEGATIVE 01/26/2015 0742   KETONESUR Negative 01/22/2015 1018   PROTEINUR 30* 01/26/2015 0742   PROTEINUR 2000 01/22/2015 1018   UROBILINOGEN 0.2 01/22/2015 1018   UROBILINOGEN 0.2 01/31/2014 1050   NITRITE NEGATIVE 01/26/2015 0742   NITRITE Negative 01/22/2015 1018   LEUKOCYTESUR NEGATIVE 01/26/2015 0742   LEUKOCYTESUR Color Interference due to blood in urine 01/22/2015 1018    STUDIES: No results found.  ASSESSMENT: 40 y.o. 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;        I: CyBorD started January 2016  (a) dexamethasone 20 mg/d every TU/WED started 02/04/2014  (b) bortezomib sQ days 1,4,8,11 of every 21 day cycle started  02/10/2014  (c) cyclophosphamide 300 mg/M2 IV weekly started 02/13/2014       2: Lenalidomide 19m daily started 07/26/2014  (2) chronic kidney disease stage III  (3) hypercalcemia: zolendronate started 02/10/2014-- repeat every 4 weeks initially, then every 12 weeks  (a) changed to denosumab/Xgeva starting 02/26/2015 because of CKD  (  4) ID prophylaxis:  (a) influenza and Pneumovax [PV13] vaccines 02/07/2014  (b) acyclovir started 02/07/2014  (c) HIV negative 01/31/2014  (d) Pneumovax P23 too be given after March 2016  (5). The patient is not a transplant candidate for financial reasons  (6) bilateral foot rash: Started on Septra DS and Diflucan 09/03/2014, to be continued for 10 days.  (7) Multiple Myeloma relapse in December 2016.   (a) starting 02/05/15: dexamethasone 75m x2 consecutive days weekly; bortezomib sQ weekly,   (b) Septra DS twice daily  (c) acyclovir 4017mdaily   (d) bortezomib/ dexamethasone discontinued after 05/06/2015 dose with evidence of progression  (8) to start carfilzomib 05/14/2015, given with monthly low dose cyclophosphamide and weekly low dose dexamethasone  (a) tolerated cyclophosphamide poorly, discontinued after cycle 1  (b) carfilzomib started 05/21/2015  PLAN: I reviewed the labs and the overall situation with maximum. He understands that his disease progressed through the past treatments and we are going to start carfilzomib today. We were going to appear this with Cytoxan but he did so poorly with a low dose of Cytoxan that I think we will try to carfilzomib alone.  We will be treated this week and next and then have a week off. He will then see me on May 11 and then we will start 3 weeks on and one-week off as per the usual protocol.  I have encouraged him to continue to hydrate himself aggressively and he is drinking at least 2 quarts of fluid daily, preferably 3.  Today I refilled her his medications including tramadol and I prescribed  Compazine since the ondansetron was not working for nausea.  He knows to let usKoreanow if he develops a fever or any other problems.     MAChauncey CruelMD   05/21/2015 2:09 PM

## 2015-05-22 ENCOUNTER — Other Ambulatory Visit: Payer: Self-pay

## 2015-05-22 ENCOUNTER — Ambulatory Visit: Payer: Self-pay

## 2015-05-22 ENCOUNTER — Encounter: Payer: Self-pay | Admitting: Oncology

## 2015-05-22 DIAGNOSIS — C9002 Multiple myeloma in relapse: Secondary | ICD-10-CM

## 2015-05-22 NOTE — Progress Notes (Signed)
amgen approved kyprolis. I let Kay-nurse know to get him scheduled.   05/22/15-05/21/16 he is approved.

## 2015-05-25 ENCOUNTER — Ambulatory Visit: Payer: Self-pay

## 2015-05-27 ENCOUNTER — Other Ambulatory Visit: Payer: Self-pay | Admitting: *Deleted

## 2015-05-27 ENCOUNTER — Encounter: Payer: Self-pay | Admitting: Oncology

## 2015-05-27 NOTE — Progress Notes (Signed)
Sent [employer] form for kyprolis  [phone]. I let Jenna know. See prev notes, they rescheduled his kyprolis

## 2015-05-28 ENCOUNTER — Other Ambulatory Visit: Payer: Self-pay | Admitting: *Deleted

## 2015-05-28 ENCOUNTER — Ambulatory Visit (HOSPITAL_COMMUNITY)
Admission: RE | Admit: 2015-05-28 | Discharge: 2015-05-28 | Disposition: A | Discharge status: Home / Self Care | Payer: Self-pay | Source: Ambulatory Visit | Attending: Oncology | Admitting: Oncology

## 2015-05-28 ENCOUNTER — Other Ambulatory Visit (HOSPITAL_BASED_OUTPATIENT_CLINIC_OR_DEPARTMENT_OTHER): Payer: Self-pay

## 2015-05-28 ENCOUNTER — Ambulatory Visit (HOSPITAL_BASED_OUTPATIENT_CLINIC_OR_DEPARTMENT_OTHER): Payer: Self-pay

## 2015-05-28 VITALS — BP 140/89 | HR 85 | Temp 98.4°F | Resp 18

## 2015-05-28 DIAGNOSIS — D649 Anemia, unspecified: Secondary | ICD-10-CM | POA: Insufficient documentation

## 2015-05-28 DIAGNOSIS — M899 Disorder of bone, unspecified: Secondary | ICD-10-CM

## 2015-05-28 DIAGNOSIS — C9002 Multiple myeloma in relapse: Secondary | ICD-10-CM

## 2015-05-28 DIAGNOSIS — G893 Neoplasm related pain (acute) (chronic): Principal | ICD-10-CM

## 2015-05-28 DIAGNOSIS — Z5112 Encounter for antineoplastic immunotherapy: Secondary | ICD-10-CM

## 2015-05-28 DIAGNOSIS — M898X9 Other specified disorders of bone, unspecified site: Secondary | ICD-10-CM

## 2015-05-28 DIAGNOSIS — C9000 Multiple myeloma not having achieved remission: Secondary | ICD-10-CM

## 2015-05-28 DIAGNOSIS — C7951 Secondary malignant neoplasm of bone: Secondary | ICD-10-CM

## 2015-05-28 LAB — CBC WITH DIFFERENTIAL/PLATELET
BASO%: 0 % (ref 0.0–2.0)
BASOS ABS: 0 10*3/uL (ref 0.0–0.1)
EOS%: 0 % (ref 0.0–7.0)
Eosinophils Absolute: 0 10*3/uL (ref 0.0–0.5)
HCT: 24.1 % — ABNORMAL LOW (ref 38.4–49.9)
HGB: 7.9 g/dL — ABNORMAL LOW (ref 13.0–17.1)
LYMPH%: 6 % — AB (ref 14.0–49.0)
MCH: 32.1 pg (ref 27.2–33.4)
MCHC: 32.8 g/dL (ref 32.0–36.0)
MCV: 98 fL (ref 79.3–98.0)
MONO#: 0.2 10*3/uL (ref 0.1–0.9)
MONO%: 1.7 % (ref 0.0–14.0)
NEUT#: 12.3 10*3/uL — ABNORMAL HIGH (ref 1.5–6.5)
NEUT%: 92.3 % — AB (ref 39.0–75.0)
PLATELETS: 583 10*3/uL — AB (ref 140–400)
RBC: 2.46 10*6/uL — ABNORMAL LOW (ref 4.20–5.82)
RDW: 13.6 % (ref 11.0–14.6)
WBC: 13.3 10*3/uL — ABNORMAL HIGH (ref 4.0–10.3)
lymph#: 0.8 10*3/uL — ABNORMAL LOW (ref 0.9–3.3)

## 2015-05-28 LAB — COMPREHENSIVE METABOLIC PANEL
ALT: 13 U/L (ref 0–55)
AST: 16 U/L (ref 5–34)
Albumin: 2 g/dL — ABNORMAL LOW (ref 3.5–5.0)
Alkaline Phosphatase: 90 U/L (ref 40–150)
Anion Gap: 11 mEq/L (ref 3–11)
BUN: 33.2 mg/dL — AB (ref 7.0–26.0)
CHLORIDE: 108 meq/L (ref 98–109)
CO2: 22 meq/L (ref 22–29)
CREATININE: 2.5 mg/dL — AB (ref 0.7–1.3)
Calcium: 8.2 mg/dL — ABNORMAL LOW (ref 8.4–10.4)
EGFR: 31 mL/min/{1.73_m2} — ABNORMAL LOW (ref >90)
GLUCOSE: 129 mg/dL (ref 70–140)
Potassium: 4 mEq/L (ref 3.5–5.1)
Sodium: 140 mEq/L (ref 136–145)
Total Bilirubin: <0.3 mg/dL (ref 0.20–1.20)
Total Protein: 8.1 g/dL (ref 6.4–8.3)

## 2015-05-28 LAB — PREPARE RBC (CROSSMATCH)

## 2015-05-28 MED ORDER — DEXTROSE 5 % IV SOLN
20.0000 mg/m2 | Freq: Once | INTRAVENOUS | Status: AC
  Administered 2015-05-28: 34 mg via INTRAVENOUS
  Filled 2015-05-28: qty 17

## 2015-05-28 MED ORDER — SODIUM CHLORIDE 0.9 % IV SOLN
Freq: Once | INTRAVENOUS | Status: AC
  Administered 2015-05-28: 15:00:00 via INTRAVENOUS

## 2015-05-28 MED ORDER — PROCHLORPERAZINE MALEATE 10 MG PO TABS
ORAL_TABLET | ORAL | Status: AC
  Filled 2015-05-28: qty 1

## 2015-05-28 MED ORDER — PROCHLORPERAZINE MALEATE 10 MG PO TABS
10.0000 mg | ORAL_TABLET | Freq: Once | ORAL | Status: AC
  Administered 2015-05-28: 10 mg via ORAL

## 2015-05-28 NOTE — Progress Notes (Signed)
Okay to treat today, despite labs, per Dr. Jana Hakim.  Patient to receive 1 unit of PRBCs today or tomorrow.  Patient to receive 1st dosgae of Kyprolis today;  Patient only to receive Compazine 10 mg as a pre-med before treatment, per Dr. Jana Hakim.

## 2015-05-28 NOTE — Patient Instructions (Addendum)
[place] Discharge Instructions for Patients Receiving Chemotherapy  Today you received the following chemotherapy agents Kyprolis  To help prevent nausea and vomiting after your treatment, we encourage you to take your nausea medication   If you develop nausea and vomiting that is not controlled by your nausea medication, call the clinic.   BELOW ARE SYMPTOMS THAT SHOULD BE REPORTED IMMEDIATELY:  *FEVER GREATER THAN 100.5 F  *CHILLS WITH OR WITHOUT FEVER  NAUSEA AND VOMITING THAT IS NOT CONTROLLED WITH YOUR NAUSEA MEDICATION  *UNUSUAL SHORTNESS OF BREATH  *UNUSUAL BRUISING OR BLEEDING  TENDERNESS IN MOUTH AND THROAT WITH OR WITHOUT PRESENCE OF ULCERS  *URINARY PROBLEMS  *BOWEL PROBLEMS  UNUSUAL RASH Items with * indicate a potential emergency and should be followed up as soon as possible.  Feel free to call the clinic you have any questions or concerns. The clinic phone number is [phone].  Please show the [place] at check-in to the Emergency Department and triage nurse.   Carfilzomib injection What is this medicine? CARFILZOMIB (kar FILZ oh mib) targets a specific protein within cancer cells and stops the cancer cells from growing. It is used to treat multiple myeloma. This medicine may be used for other purposes; ask your health care provider or pharmacist if you have questions. What should I tell my health care provider before I take this medicine? They need to know if you have any of these conditions: -heart disease -history of blood clots -irregular heartbeat -kidney disease -liver disease -lung or breathing disease -an unusual or allergic reaction to carfilzomib, or other medicines, foods, dyes, or preservatives -pregnant or trying to get pregnant -breast-feeding How should I use this medicine? This medicine is for injection or infusion into a vein. It is given by a health care professional in a hospital or clinic  setting. Talk to your pediatrician regarding the use of this medicine in children. Special care may be needed. Overdosage: If you think you have taken too much of this medicine contact a poison control center or emergency room at once. NOTE: This medicine is only for you. Do not share this medicine with others. What if I miss a dose? It is important not to miss your dose. Call your doctor or health care professional if you are unable to keep an appointment. What may interact with this medicine? Interactions are not expected. Give your health care provider a list of all the medicines, herbs, non-prescription drugs, or dietary supplements you use. Also tell them if you smoke, drink alcohol, or use illegal drugs. Some items may interact with your medicine. This list may not describe all possible interactions. Give your health care provider a list of all the medicines, herbs, non-prescription drugs, or dietary supplements you use. Also tell them if you smoke, drink alcohol, or use illegal drugs. Some items may interact with your medicine. What should I watch for while using this medicine? Your condition will be monitored carefully while you are receiving this medicine. Report any side effects. Continue your course of treatment even though you feel ill unless your doctor tells you to stop. You may need blood work done while you are taking this medicine. Do not become pregnant while taking this medicine or for at least 30 days after stopping it. Women should inform their doctor if they wish to become pregnant or think they might be pregnant. There is a potential for serious side effects to an unborn child. Men should not father a child  while taking this medicine and for 90 days after stopping it. Talk to your health care professional or pharmacist for more information. Do not breast-feed an infant while taking this medicine. Check with your doctor or health care professional if you get an attack of severe  diarrhea, nausea and vomiting, or if you sweat a lot. The loss of too much body fluid can make it dangerous for you to take this medicine. You may get dizzy. Do not drive, use machinery, or do anything that needs mental alertness until you know how this medicine affects you. Do not stand or sit up quickly, especially if you are an older patient. This reduces the risk of dizzy or fainting spells. What side effects may I notice from receiving this medicine? Side effects that you should report to your doctor or health care professional as soon as possible: -allergic reactions like skin rash, itching or hives, swelling of the face, lips, or tongue -confusion -dizziness -feeling faint or lightheaded -fever or chills -palpitations -seizures -signs and symptoms of bleeding such as bloody or black, tarry stools; red or dark-brown urine; spitting up blood or brown material that looks like coffee grounds; red spots on the skin; unusual bruising or bleeding including from the eye, gums, or nose -signs and symptoms of a blood clot such as breathing problems; changes in vision; chest pain; severe, sudden headache; pain, swelling, warmth in the leg; trouble speaking; sudden numbness or weakness of the face, arm or leg -signs and symptoms of kidney injury like trouble passing urine or change in the amount of urine -signs and symptoms of liver injury like dark yellow or brown urine; general ill feeling or flu-like symptoms; light-colored stools; loss of appetite; nausea; right upper belly pain; unusually weak or tired; yellowing of the eyes or skin Side effects that usually do not require medical attention (report to your doctor or health care professional if they continue or are bothersome): -back pain -cough -diarrhea -headache -muscle cramps -vomiting This list may not describe all possible side effects. Call your doctor for medical advice about side effects. You may report side effects to FDA at  1-800-FDA-1088. Where should I keep my medicine? This drug is given in a hospital or clinic and will not be stored at home. NOTE: This sheet is a summary. It may not cover all possible information. If you have questions about this medicine, talk to your doctor, pharmacist, or health care provider.    2016, Elsevier/Gold Standard. (2014-09-09 16:16:00)

## 2015-05-29 ENCOUNTER — Ambulatory Visit (HOSPITAL_BASED_OUTPATIENT_CLINIC_OR_DEPARTMENT_OTHER): Payer: Self-pay

## 2015-05-29 ENCOUNTER — Other Ambulatory Visit: Payer: Self-pay

## 2015-05-29 VITALS — BP 151/75 | HR 69 | Temp 97.5°F | Resp 20

## 2015-05-29 DIAGNOSIS — D649 Anemia, unspecified: Secondary | ICD-10-CM

## 2015-05-29 DIAGNOSIS — Z5112 Encounter for antineoplastic immunotherapy: Secondary | ICD-10-CM

## 2015-05-29 DIAGNOSIS — C7951 Secondary malignant neoplasm of bone: Secondary | ICD-10-CM

## 2015-05-29 DIAGNOSIS — G893 Neoplasm related pain (acute) (chronic): Principal | ICD-10-CM

## 2015-05-29 DIAGNOSIS — C9000 Multiple myeloma not having achieved remission: Secondary | ICD-10-CM

## 2015-05-29 DIAGNOSIS — M899 Disorder of bone, unspecified: Secondary | ICD-10-CM

## 2015-05-29 DIAGNOSIS — C9002 Multiple myeloma in relapse: Secondary | ICD-10-CM

## 2015-05-29 MED ORDER — PROCHLORPERAZINE MALEATE 10 MG PO TABS
ORAL_TABLET | ORAL | Status: AC
  Filled 2015-05-29: qty 1

## 2015-05-29 MED ORDER — PROCHLORPERAZINE MALEATE 10 MG PO TABS
10.0000 mg | ORAL_TABLET | Freq: Once | ORAL | Status: AC
  Administered 2015-05-29: 10 mg via ORAL

## 2015-05-29 MED ORDER — SODIUM CHLORIDE 0.9 % IV SOLN
Freq: Once | INTRAVENOUS | Status: AC
  Administered 2015-05-29: 10:00:00 via INTRAVENOUS

## 2015-05-29 MED ORDER — DEXTROSE 5 % IV SOLN
20.0000 mg/m2 | Freq: Once | INTRAVENOUS | Status: AC
  Administered 2015-05-29: 34 mg via INTRAVENOUS
  Filled 2015-05-29: qty 17

## 2015-05-29 MED ORDER — SODIUM CHLORIDE 0.9 % IV SOLN
Freq: Once | INTRAVENOUS | Status: AC
  Administered 2015-05-29: 09:00:00 via INTRAVENOUS

## 2015-05-29 NOTE — Patient Instructions (Signed)
Transfusin de sangre, cuidados posteriores (Blood Transfusion, Care After) Estas indicaciones le proporcionan informacin acerca de cmo deber cuidarse despus del procedimiento. El mdico tambin podr darle instrucciones especficas. Comunquese con el mdico si tiene algn problema o tiene preguntas despus del procedimiento.  CUIDADOS EN EL HOGAR 1. Tome los medicamentos solamente como se lo haya indicado el mdico. Pregntele al mdico si puede tomar un analgsico de venta libre si tiene fiebre o dolor de [place] despus del procedimiento. 2. Retome sus actividades habituales como se lo haya indicado el mdico. SOLICITE AYUDA SI:  1. Presenta enrojecimiento o irritacin en el lugar donde se coloc la va intravenosa. 2. Jaclynn Guarneri, escalofros o dolor de cabeza que no se [place]. 3. La orina es ms oscura que lo normal. 4. La orina se vuelve de color: 1. Rosa. Aten. 3. Marrn. 5. La parte blanca de los ojos se vuelve amarilla (ictericia). 6. Se siente dbil despus de realizar sus actividades habituales. SOLICITE AYUDA DE INMEDIATO SI:   Tiene dificultad para respirar.  Tiene fiebre y escalofros, y tambin siente los siguientes sntomas:  Ansiedad.  Dolor de pecho o de espalda.  Piel rosada o irritada.  Collins.  Latidos cardacos acelerados.  Ganas de vomitar (nuseas).   Esta informacin no tiene Marine scientist el consejo del mdico. Asegrese de hacerle al mdico cualquier pregunta que tenga.   Document Released: 02/07/2014 Elsevier Interactive Patient Education 2016 [place] Discharge Instructions for Patients Receiving Chemotherapy  Today you received the following chemotherapy agents KYPROLIS  To help prevent nausea and vomiting after your treatment, we encourage you to take your nausea medication if needed   If you develop nausea and vomiting that is not controlled by your nausea  medication, call the clinic.   BELOW ARE SYMPTOMS THAT SHOULD BE REPORTED IMMEDIATELY:  *FEVER GREATER THAN 100.5 F  *CHILLS WITH OR WITHOUT FEVER  NAUSEA AND VOMITING THAT IS NOT CONTROLLED WITH YOUR NAUSEA MEDICATION  *UNUSUAL SHORTNESS OF BREATH  *UNUSUAL BRUISING OR BLEEDING  TENDERNESS IN MOUTH AND THROAT WITH OR WITHOUT PRESENCE OF ULCERS  *URINARY PROBLEMS  *BOWEL PROBLEMS  UNUSUAL RASH Items with * indicate a potential emergency and should be followed up as soon as possible.  Feel free to call the clinic you have any questions or concerns. The clinic phone number is [phone].  Please show the [place] at check-in to the Emergency Department and triage nurse.

## 2015-06-01 ENCOUNTER — Telehealth: Payer: Self-pay

## 2015-06-01 LAB — TYPE AND SCREEN
ABO/RH(D): O POS
Antibody Screen: NEGATIVE
UNIT DIVISION: 0
UNIT DIVISION: 0

## 2015-06-01 NOTE — Telephone Encounter (Signed)
Unable to leave message d/t patient not having voice mail set up

## 2015-06-01 NOTE — Telephone Encounter (Signed)
-----   Message from Azzie Glatter, RN sent at 05/28/2015  4:17 PM EDT ----- Regarding: "1st time chemotherapy---per Dr. Jana Hakim" Patient received Kyprolis for the 1st time today.  Patient tolerated treatment well.  Patient has Hgb of 7.9 today and will receive Kyprolils and 2 units of PRBCs tomorrow (05/29/15).

## 2015-06-10 NOTE — Progress Notes (Signed)
Pt stated he was taking his dexamethasone the day before and the day of chemo. Sent message to Dr Jana Hakim and he confirmed this is correct. It states differently on MAR.

## 2015-06-11 ENCOUNTER — Telehealth: Payer: Self-pay | Admitting: Oncology

## 2015-06-11 ENCOUNTER — Other Ambulatory Visit (HOSPITAL_BASED_OUTPATIENT_CLINIC_OR_DEPARTMENT_OTHER): Payer: Self-pay

## 2015-06-11 ENCOUNTER — Ambulatory Visit (HOSPITAL_BASED_OUTPATIENT_CLINIC_OR_DEPARTMENT_OTHER): Payer: Self-pay

## 2015-06-11 ENCOUNTER — Ambulatory Visit (HOSPITAL_BASED_OUTPATIENT_CLINIC_OR_DEPARTMENT_OTHER): Payer: Self-pay | Admitting: Oncology

## 2015-06-11 VITALS — BP 131/89 | HR 76 | Temp 97.7°F | Resp 18 | Ht 63.0 in | Wt 148.2 lb

## 2015-06-11 DIAGNOSIS — C9002 Multiple myeloma in relapse: Secondary | ICD-10-CM

## 2015-06-11 DIAGNOSIS — Z5112 Encounter for antineoplastic immunotherapy: Secondary | ICD-10-CM

## 2015-06-11 DIAGNOSIS — C7951 Secondary malignant neoplasm of bone: Secondary | ICD-10-CM

## 2015-06-11 DIAGNOSIS — G893 Neoplasm related pain (acute) (chronic): Secondary | ICD-10-CM

## 2015-06-11 DIAGNOSIS — M899 Disorder of bone, unspecified: Secondary | ICD-10-CM

## 2015-06-11 DIAGNOSIS — C9000 Multiple myeloma not having achieved remission: Secondary | ICD-10-CM

## 2015-06-11 DIAGNOSIS — N183 Chronic kidney disease, stage 3 (moderate): Secondary | ICD-10-CM

## 2015-06-11 DIAGNOSIS — N179 Acute kidney failure, unspecified: Secondary | ICD-10-CM

## 2015-06-11 LAB — COMPREHENSIVE METABOLIC PANEL
ALBUMIN: 1.7 g/dL — AB (ref 3.5–5.0)
ALK PHOS: 150 U/L (ref 40–150)
ALT: 25 U/L (ref 0–55)
ANION GAP: 13 meq/L — AB (ref 3–11)
AST: 27 U/L (ref 5–34)
BUN: 51.5 mg/dL — ABNORMAL HIGH (ref 7.0–26.0)
CALCIUM: 8.3 mg/dL — AB (ref 8.4–10.4)
CO2: 19 mEq/L — ABNORMAL LOW (ref 22–29)
Chloride: 108 mEq/L (ref 98–109)
Creatinine: 3.3 mg/dL (ref 0.7–1.3)
EGFR: 22 mL/min/{1.73_m2} — AB (ref >90)
GLUCOSE: 153 mg/dL — AB (ref 70–140)
POTASSIUM: 3.7 meq/L (ref 3.5–5.1)
SODIUM: 140 meq/L (ref 136–145)
Total Protein: 7.5 g/dL (ref 6.4–8.3)

## 2015-06-11 LAB — CBC WITH DIFFERENTIAL/PLATELET
BASO%: 0 % (ref 0.0–2.0)
Basophils Absolute: 0 10*3/uL (ref 0.0–0.1)
EOS%: 0 % (ref 0.0–7.0)
Eosinophils Absolute: 0 10*3/uL (ref 0.0–0.5)
HEMATOCRIT: 25.9 % — AB (ref 38.4–49.9)
HEMOGLOBIN: 8.4 g/dL — AB (ref 13.0–17.1)
LYMPH#: 0.9 10*3/uL (ref 0.9–3.3)
LYMPH%: 5.8 % — ABNORMAL LOW (ref 14.0–49.0)
MCH: 31 pg (ref 27.2–33.4)
MCHC: 32.4 g/dL (ref 32.0–36.0)
MCV: 95.6 fL (ref 79.3–98.0)
MONO#: 0.4 10*3/uL (ref 0.1–0.9)
MONO%: 3 % (ref 0.0–14.0)
NEUT%: 91.2 % — ABNORMAL HIGH (ref 39.0–75.0)
NEUTROS ABS: 13.5 10*3/uL — AB (ref 1.5–6.5)
PLATELETS: 603 10*3/uL — AB (ref 140–400)
RBC: 2.71 10*6/uL — ABNORMAL LOW (ref 4.20–5.82)
RDW: 14.3 % (ref 11.0–14.6)
WBC: 14.8 10*3/uL — AB (ref 4.0–10.3)

## 2015-06-11 MED ORDER — DEXAMETHASONE 4 MG PO TABS: ORAL_TABLET | ORAL | Status: DC

## 2015-06-11 MED ORDER — SODIUM CHLORIDE 0.9 % IV SOLN
Freq: Once | INTRAVENOUS | Status: AC
  Administered 2015-06-11: 12:00:00 via INTRAVENOUS

## 2015-06-11 MED ORDER — PROCHLORPERAZINE MALEATE 10 MG PO TABS
10.0000 mg | ORAL_TABLET | Freq: Once | ORAL | Status: AC
  Administered 2015-06-11: 10 mg via ORAL

## 2015-06-11 MED ORDER — ACYCLOVIR 200 MG PO CAPS: 400.0000 mg | ORAL_CAPSULE | Freq: Every day | ORAL | Status: DC

## 2015-06-11 MED ORDER — PROCHLORPERAZINE MALEATE 10 MG PO TABS
ORAL_TABLET | ORAL | Status: AC
  Filled 2015-06-11: qty 1

## 2015-06-11 MED ORDER — TRAMADOL HCL 50 MG PO TABS: 50.0000 mg | ORAL_TABLET | Freq: Four times a day (QID) | ORAL | Status: DC | PRN

## 2015-06-11 MED ORDER — PROCHLORPERAZINE MALEATE 10 MG PO TABS: 10.0000 mg | ORAL_TABLET | Freq: Four times a day (QID) | ORAL | Status: DC | PRN

## 2015-06-11 MED ORDER — SODIUM CHLORIDE 0.9 % IV SOLN
10.0000 mg | Freq: Once | INTRAVENOUS | Status: AC
  Administered 2015-06-11: 10 mg via INTRAVENOUS
  Filled 2015-06-11: qty 1

## 2015-06-11 MED ORDER — DEXTROSE 5 % IV SOLN
20.0000 mg/m2 | Freq: Once | INTRAVENOUS | Status: AC
  Administered 2015-06-11: 34 mg via INTRAVENOUS
  Filled 2015-06-11: qty 17

## 2015-06-11 MED FILL — PROCHLORPERAZINE 10 MG TAB: 10 | 15 days supply | Qty: 60 | Fill #0

## 2015-06-11 NOTE — Progress Notes (Signed)
Here his progress   Charles [name]  [phone] [phone]) [phone]    ID: Charles [name] DOB: Mar 06, 1975  MR#: [id number]  LKT#:[id number]  Patient Care Team: Charles Garter, MD as PCP - General (Internal Medicine) Charles Borders, NP as Nurse Practitioner (Hematology and Oncology) Charles Cruel, MD as Consulting Physician (Oncology) PCP: Charles Chessman, MD GYN: SU:  OTHER MD:  CHIEF COMPLAINT: multiple myeloma  CURRENT TREATMENT:   Denosumab/Xgeva; carfilzomib, cyclophosphamide, dexamethasone  HISTORY  OF MULTIPLE MYELOMA: From the original intake note January 2016:  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 The pain was worse with coughing. Exam was unremarkable, and he was treated with Mobic. No labs were obtained. On 01/30/2014 he presented to the emergency room at [facility] with similar complaints and also reported a 10 pound weight loss over the past month. A chest x-ray was significant only for mild atelectasis, but ACT NGO of the chest 01/30/2014, while it showed no pulmonary emboli, showed an osteolytic lesion destroying the manubrium of the sternum. There was extra osseous extension into the anterior mediastinum.  The patient was admitted and found to have a creatinine of 1.64 with a GFR of 51. Bilateral renal ultrasound showed no hydronephrosis or masses, and normal size and cortical thickness of both kidneys, though there was mild diffuse echogenicity. Urine total protein was 145 mg/dL and immunofixation showed a monoclonal IgG kappa protein as well as monoclonal free kappa light chains. On 01/31/2014 serum protein electrophoresis showed an M spike of 2.22 g/dL with a total protein of 8.3 and albumin 3.4. Kappa lambda light chains from the serum obtained 02/04/2014 showed 10.50 mg/dL on the, 1.74  in the lambda, with an elevated ratio at 6.03. Beta-2 microglobulin was 7.36. LDH was normal at 184 and HIV antibody was nonreactive.  Biopsy of the manubrial mass 02/03/2014 showed extensive infiltration of the bone by sheets of atypical plasma cells, positive for CD138, kappa restricted (SZA 16-23). Right iliac bone marrow biopsy Charles [name] 05/21/2014 (FZB 16-1) showed an average of 12% plasma cells with numerous aggregates and sheets of atypical plasma cells, which were kappa restricted. Cytogenetic analysis is pending  The patient's subsequent history is as detailed below  INTERVAL HISTORY: Charles [name] returns today for follow-up of his multiple myeloma. After some delay we were able to obtain carfilzomib for him and he received his first dose April 27. He did well with that. He then received a blood transfusion on May 1 and that did not go so well. He had nausea and back pain. He felt very tired for several days and had a low-grade fever. He may have had a transfusion reaction. He had some vomiting. All that thankfully has cleared. He is here today to receive his third dose of carfilzomib actually the day 8 cycle 1 dose.  REVIEW OF SYSTEMS: He is feeling "okay". When he takes the dexamethasone he can't sleep. He is tried Tylenol PM without success. He is really not getting out of the house much. He feels very vulnerable. There have been no unusual headaches, visual changes, cough, phlegm production, pleurisy, or change in bowel or bladder habits. He denies rash or bleeding. A detailed review of systems today was otherwise stable  PAST MEDICAL HISTORY: Past Medical History  Diagnosis Date  . IgG myeloma ([place])   . CKD (chronic kidney  disease), stage III   . Hypercholesterolemia   . Lytic bone lesions on xray     PAST SURGICAL HISTORY: No past surgical history on file.  FAMILY HISTORY Family History  Problem Relation Age of Onset  . Diabetes Father    The patient's parents are still living, in  their late 24's. The patient has 2 brothers, 3 sisters. There is noc cancer in the fmaily to his knowledge.  SOCIAL HISTORY:  Originally from [facility] [place], moved here 19 years ago. He works in Nurse, adult. Married (wife's name is Charles [name]), lives in [place]. He has 5 children ages 33, 44, 24, 49 and 62 months Charles [name], Charles [name], Charles [name] and Charles [name]) in good health. Best number to call him is [phone]    ADVANCED DIRECTIVES: not  In place   HEALTH MAINTENANCE: Social History  Substance Use Topics  . Smoking status: Never Smoker   . Smokeless tobacco: Never Used  . Alcohol Use: No     Colonoscopy:  PSA:  Bone density:  Lipid panel:  Allergies  Allergen Reactions  . Ciprofloxacin Rash  . Mobic [Meloxicam] Rash    Current Outpatient Prescriptions  Medication Sig Dispense Refill  . acyclovir (ZOVIRAX) 200 MG capsule Take 2 capsules (400 mg total) by mouth daily. 60 capsule 6  . dexamethasone (DECADRON) 4 MG tablet Take 5 tablets (61m) twice a week, not on treatment day (tome 5 pastillas dos veces a la semana, no en el dia de tratamiento) 40 tablet 4  . prochlorperazine (COMPAZINE) 10 MG tablet Take 1 tablet (10 mg total) by mouth every 6 (six) hours as needed for nausea or vomiting. 60 tablet 6  . traMADol (ULTRAM) 50 MG tablet Take 1 tablet (50 mg total) by mouth every 6 (six) hours as needed. 60 tablet 5   No current facility-administered medications for this visit.    OBJECTIVE: Young Latin man In no acute distress Filed Vitals:   06/11/15 1027  BP: 131/89  Pulse: 76  Temp: 97.7 F (36.5 C)  Resp: 18     Body mass index is 26.26 kg/(m^2).    ECOG FS:2 - Symptomatic, <50% confined to bed   Sclerae unicteric, EOMs intact Oropharynx clear, dentition in good repair No cervical or supraclavicular adenopathy Lungs no rales or rhonchi Heart regular rate and rhythm Abd soft, nontender, positive bowel sounds MSK no focal spinal tenderness, no  upper extremity lymphedema Neuro: nonfocal, well oriented, appropriate affect    Results for Charles [name](MRN [id number] as of 05/21/2015 14:11  Ref. Range 01/23/2015 13:30 02/12/2015 14:33 03/12/2015 12:53 04/09/2015 15:34 05/14/2015 13:25  M Protein SerPl Elph-Mcnc Latest Ref Range: Not Observed g/dL  0.5 (H) 1.1 (H) 1.2 (H) 1.8 (H)    Kappa/Lambda FluidC Ratio 0.26 - 1.65  13.72 (H) 6.83 (H) 6.19 (H)        Results for JZAILYN, ROWSER(MRN [id number] as of 06/11/2015 10:56  Ref. Range 05/06/2015 09:29 05/14/2015 13:25 05/21/2015 12:59 05/28/2015 13:27 06/11/2015 10:10  Creatinine Latest Ref Range: 0.7-1.3 mg/dL 1.7 (H) 2.4 (H) 2.3 (H) 2.5 (H) 3.3 (HH)    CMP     Component Value Date/Time   NA 140 06/11/2015 1010   NA 144 01/27/2015 0605   K 3.7 06/11/2015 1010   K 4.8 01/27/2015 0605   CL 117* 01/27/2015 0605   CO2 19* 06/11/2015 1010   CO2 18* 01/27/2015 0605   GLUCOSE 153* 06/11/2015 1010   GLUCOSE 116* 01/27/2015 0605   BUN 51.5* 06/11/2015  1010   BUN 64* 01/27/2015 0605   CREATININE 3.3* 06/11/2015 1010   CREATININE 3.03* 01/27/2015 0605   CALCIUM 8.3* 06/11/2015 1010   CALCIUM 6.3* 01/27/2015 0605   PROT 7.5 06/11/2015 1010   PROT 6.9 05/14/2015 1325   PROT 4.8* 01/25/2015 0740   ALBUMIN 1.7* 06/11/2015 1010   ALBUMIN 1.6* 01/27/2015 0605   AST 27 06/11/2015 1010   AST 18 01/25/2015 0740   ALT 25 06/11/2015 1010   ALT 32 01/25/2015 0740   ALKPHOS 150 06/11/2015 1010   ALKPHOS 107 01/25/2015 0740   BILITOT <0.30 06/11/2015 1010   BILITOT 0.1* 01/25/2015 0740   GFRNONAA 24* 01/27/2015 0605   GFRAA 28* 01/27/2015 0605    INo results found for: SPEP, UPEP  Lab Results  Component Value Date   WBC 14.8* 06/11/2015   NEUTROABS 13.5* 06/11/2015   HGB 8.4* 06/11/2015   HCT 25.9* 06/11/2015   MCV 95.6 06/11/2015   PLT 603* 06/11/2015      Chemistry      Component Value Date/Time   NA 140 06/11/2015 1010   NA 144 01/27/2015 0605   K 3.7 06/11/2015  1010   K 4.8 01/27/2015 0605   CL 117* 01/27/2015 0605   CO2 19* 06/11/2015 1010   CO2 18* 01/27/2015 0605   BUN 51.5* 06/11/2015 1010   BUN 64* 01/27/2015 0605   CREATININE 3.3* 06/11/2015 1010   CREATININE 3.03* 01/27/2015 0605      Component Value Date/Time   CALCIUM 8.3* 06/11/2015 1010   CALCIUM 6.3* 01/27/2015 0605   ALKPHOS 150 06/11/2015 1010   ALKPHOS 107 01/25/2015 0740   AST 27 06/11/2015 1010   AST 18 01/25/2015 0740   ALT 25 06/11/2015 1010   ALT 32 01/25/2015 0740   BILITOT <0.30 06/11/2015 1010   BILITOT 0.1* 01/25/2015 0740       No results found for: LABCA2  No components found for: LABCA125  No results for input(s): INR in the last 168 hours.  Urinalysis    Component Value Date/Time   COLORURINE YELLOW 01/26/2015 0742   APPEARANCEUR CLOUDY* 01/26/2015 0742   LABSPEC 1.014 01/26/2015 0742   LABSPEC 1.030 01/22/2015 1018   PHURINE 5.0 01/26/2015 0742   PHURINE 5.0 01/22/2015 1018   GLUCOSEU NEGATIVE 01/26/2015 0742   GLUCOSEU Negative 01/22/2015 1018   HGBUR LARGE* 01/26/2015 0742   HGBUR Large 01/22/2015 1018   BILIRUBINUR NEGATIVE 01/26/2015 0742   BILIRUBINUR Negative 01/22/2015 1018   KETONESUR NEGATIVE 01/26/2015 0742   KETONESUR Negative 01/22/2015 1018   PROTEINUR 30* 01/26/2015 0742   PROTEINUR 2000 01/22/2015 1018   UROBILINOGEN 0.2 01/22/2015 1018   UROBILINOGEN 0.2 01/31/2014 1050   NITRITE NEGATIVE 01/26/2015 0742   NITRITE Negative 01/22/2015 1018   LEUKOCYTESUR NEGATIVE 01/26/2015 0742   LEUKOCYTESUR Color Interference due to blood in urine 01/22/2015 1018    STUDIES: No results found.  ASSESSMENT: 40 y.o. 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 (2.22 g/dL) and urine (145 mg/dL), bone marrow biopsy 02/03/2014 showing an average 12% plasmacytosis, with some sheets of abnormal plasma cells noted, cytogenetics pending; baseline beta 2  microglobulin was 7.36, baseline creatinine 1.64, with GFR 51, calcium on general 05/21/2014 was 10.8, LDH was normal  (1) Multiple myeloma stage IIA diagnosed January 2016;        I: CyBorD started January 2016  (a) dexamethasone 20 mg/d every TU/WED started 02/04/2014  (b) bortezomib sQ  days 1,4,8,11 of every 21 day cycle started 02/10/2014  (c) cyclophosphamide 300 mg/M2 IV weekly started 02/13/2014       2: Lenalidomide 18m daily started 07/26/2014, discontinued December 2016 with progression  (2) chronic kidney disease stage III  (3) hypercalcemia: zolendronate started 02/10/2014-- repeat every 4 weeks initially, then every 12 weeks  (a) changed to denosumab/Xgeva starting 02/26/2015 because of CKD, repeated every 12 weeks  (4) ID prophylaxis:  (a) influenza and Pneumovax [PV13] vaccines 02/07/2014  (b) acyclovir started 02/07/2014  (c) HIV negative 01/31/2014  (d) Pneumovax P23 too be given after March 2016  (5). The patient is not a transplant candidate for financial reasons  (6) bilateral foot rash: Started on Septra DS and Diflucan 09/03/2014, resolved  (7) Multiple Myeloma relapse in December 2016.   (a) starting 02/05/15: dexamethasone 230mx2 consecutive days weekly; bortezomib sQ weekly,   (b) Septra DS twice daily  (c) acyclovir 40035maily   (d) bortezomib/ dexamethasone discontinued after 05/06/2015 dose with evidence of progression  (8) started carfilzomib 05/28/2015, given with weekly low dose dexamethasone  (a) tolerated cyclophosphamide poorly, discontinued after cycle 1  (b) carfilzomib started 05/28/2015  (c) taking dexamethasone 20 mg daily 2 days a week, not on treatment days)   PLAN: Deran is clinically stable. He is handling his pain well with tramadol once or twice daily and he is not constipated from that. He appears to have had a minor transfusion reaction with his blood transfusion 06/01/2015.  I am hopeful we can control his myeloma on the current  treatment. We do have to watch his creatinine which is creeping up. He has been encouraged to hydrate himself with at least 2 quarts of water daily.  He is going to start his second cycle on May 23. We are going to see him at the beginning of his third cycle June 20, but of course he knows to call us Kore[place] any problems that may develop before then.     MAGChauncey CruelD   06/11/2015 10:55 AM

## 2015-06-11 NOTE — Telephone Encounter (Signed)
appt made and avs printed °

## 2015-06-11 NOTE — Progress Notes (Signed)
The current carfilzomib cycle has been irregular-- it is best to consider today's dose as day 15 cycle 1. He will be treated tomorrow, be off next week, then resume a more normal cycle 2 06/23/2015  We are leaving the dose as is given the concerns regarding his rising creatinine

## 2015-06-11 NOTE — Progress Notes (Signed)
Pt saw Dr. Jana Hakim today prior to chemo.  OK to treat with Crea 3.3 as per Dr. Jana Hakim.

## 2015-06-11 NOTE — Patient Instructions (Signed)
[facility] Discharge Instructions for Patients Receiving Chemotherapy  Today you received the following chemotherapy agents Kyprolis.  To help prevent nausea and vomiting after your treatment, we encourage you to take your nausea medication as prescribed.   If you develop nausea and vomiting that is not controlled by your nausea medication, call the clinic.   BELOW ARE SYMPTOMS THAT SHOULD BE REPORTED IMMEDIATELY:  *FEVER GREATER THAN 100.5 F  *CHILLS WITH OR WITHOUT FEVER  NAUSEA AND VOMITING THAT IS NOT CONTROLLED WITH YOUR NAUSEA MEDICATION  *UNUSUAL SHORTNESS OF BREATH  *UNUSUAL BRUISING OR BLEEDING  TENDERNESS IN MOUTH AND THROAT WITH OR WITHOUT PRESENCE OF ULCERS  *URINARY PROBLEMS  *BOWEL PROBLEMS  UNUSUAL RASH Items with * indicate a potential emergency and should be followed up as soon as possible.  Feel free to call the clinic you have any questions or concerns. The clinic phone number is [phone].  Please show the CHEMO ALERT CARD at check-in to the Emergency Department and triage nurse.   

## 2015-06-12 ENCOUNTER — Other Ambulatory Visit: Payer: Self-pay

## 2015-06-12 ENCOUNTER — Other Ambulatory Visit: Payer: Self-pay | Admitting: Oncology

## 2015-06-12 ENCOUNTER — Ambulatory Visit (HOSPITAL_BASED_OUTPATIENT_CLINIC_OR_DEPARTMENT_OTHER): Payer: Self-pay

## 2015-06-12 VITALS — BP 134/75 | HR 52 | Temp 97.7°F | Resp 18

## 2015-06-12 DIAGNOSIS — M899 Disorder of bone, unspecified: Secondary | ICD-10-CM

## 2015-06-12 DIAGNOSIS — N179 Acute kidney failure, unspecified: Secondary | ICD-10-CM

## 2015-06-12 DIAGNOSIS — Z5112 Encounter for antineoplastic immunotherapy: Secondary | ICD-10-CM

## 2015-06-12 DIAGNOSIS — G893 Neoplasm related pain (acute) (chronic): Principal | ICD-10-CM

## 2015-06-12 DIAGNOSIS — C9000 Multiple myeloma not having achieved remission: Secondary | ICD-10-CM

## 2015-06-12 DIAGNOSIS — C9002 Multiple myeloma in relapse: Secondary | ICD-10-CM

## 2015-06-12 DIAGNOSIS — C7951 Secondary malignant neoplasm of bone: Secondary | ICD-10-CM

## 2015-06-12 LAB — KAPPA/LAMBDA LIGHT CHAINS
IG KAPPA FREE LIGHT CHAIN: 226.1 mg/L — AB (ref 3.30–19.40)
IG LAMBDA FREE LIGHT CHAIN: 7.58 mg/L (ref 5.71–26.30)
Kappa/Lambda FluidC Ratio: 29.83 — ABNORMAL HIGH (ref 0.26–1.65)

## 2015-06-12 LAB — BETA 2 MICROGLOBULIN, SERUM: BETA 2: 8.7 mg/L — AB (ref 0.6–2.4)

## 2015-06-12 MED ORDER — SODIUM CHLORIDE 0.9 % IV SOLN
Freq: Once | INTRAVENOUS | Status: AC
  Administered 2015-06-12: 12:00:00 via INTRAVENOUS

## 2015-06-12 MED ORDER — DEXAMETHASONE SODIUM PHOSPHATE 100 MG/10ML IJ SOLN
10.0000 mg | Freq: Once | INTRAMUSCULAR | Status: AC
  Administered 2015-06-12: 10 mg via INTRAVENOUS
  Filled 2015-06-12: qty 1

## 2015-06-12 MED ORDER — DEXTROSE 5 % IV SOLN
20.0000 mg/m2 | Freq: Once | INTRAVENOUS | Status: AC
  Administered 2015-06-12: 34 mg via INTRAVENOUS
  Filled 2015-06-12: qty 17

## 2015-06-12 MED ORDER — PROCHLORPERAZINE MALEATE 10 MG PO TABS
ORAL_TABLET | ORAL | Status: AC
  Filled 2015-06-12: qty 1

## 2015-06-12 MED ORDER — PROCHLORPERAZINE MALEATE 10 MG PO TABS
10.0000 mg | ORAL_TABLET | Freq: Once | ORAL | Status: AC
  Administered 2015-06-12: 10 mg via ORAL

## 2015-06-12 MED FILL — DEXAMETHASONE 4 MG TABLET: 4 | 28 days supply | Qty: 40 | Fill #0

## 2015-06-12 MED FILL — ACYCLOVIR 200 MG CAPSULE: 200 | 30 days supply | Qty: 60 | Fill #0

## 2015-06-12 MED FILL — traMADol HCL 50 MG TABS: 50 | 15 days supply | Qty: 60 | Fill #0

## 2015-06-12 NOTE — Patient Instructions (Signed)
Carfilzomib injection Qu es este medicamento? CARFILZOMIB acta sobre una protena especfica dentro de las clulas cancerosas y detiene el crecimiento de las clulas cancerosas. Este medicamento se [place] para tratar [employer]. Este medicamento puede ser utilizado para otros usos; si tiene alguna pregunta consulte con su proveedor de atencin mdica o con su farmacutico. Qu le debo informar a mi profesional de la salud antes de tomar este medicamento? Necesita saber si usted presenta alguno de los siguientes problemas o situaciones: enfermedad cardiaca antecedentes de cogulos sanguneos latidos cardacos irregulares enfermedad heptica enfermedad renal enfermedad pulmonar o respiratoria una reaccin alrgica o inusual al carfilzomib, a otros medicamentos, alimentos, colorantes o conservantes si est embarazada o buscando quedar embarazada si est amamantando a un beb Cmo debo utilizar este medicamento? Este medicamento se administra mediante inyeccin o infusin por va intravenosa. Lo administra un profesional de Technical sales engineer en un hospital o en un entorno clnico. Hable con su pediatra para informarse acerca del uso de este medicamento en nios. Puede requerir atencin especial. Sobredosis: Pngase en contacto inmediatamente con un centro toxicolgico o una sala de urgencia si usted cree que haya tomado demasiado medicamento. ATENCIN: [employer] es solo para usted. No comparta este medicamento con nadie. Qu sucede si me olvido de una dosis? Es importante no olvidar ninguna dosis. Informe a su mdico o a su profesional de la salud si no puede asistir a Photographer. Qu puede interactuar con este medicamento? No se esperan interacciones. Puede ser que esta lista no menciona todas las posibles interacciones. Informe a su profesional de [employer]	[place] de [employer] productos a base de hierbas, medicamentos de Nutrioso o suplementos nutritivos que est tomando. Si usted fuma,  consume bebidas alcohlicas o si utiliza drogas ilegales, indqueselo tambin a su profesional de [employer]	[place]. Algunas sustancias pueden interactuar con su medicamento. A qu debo estar atento al usar [employer]? Se supervisar su estado de salud atentamente mientras reciba este medicamento. Si presenta alguno de los [employer], infrmelos. Contine con el tratamiento aun si se siente enfermo, a menos que su mdico le indique que lo suspenda. Usted podr Runner, broadcasting/film/video de sangre mientras est usando este medicamento. No se debe quedar embarazada mientras est tomando este medicamento o durante 2 semanas despus de dejar de tomarlo. Las mujeres deben informar a su mdico si estn buscando quedar embarazadas o si creen que estn embarazadas. Existe la posibilidad de efectos secundarios graves a un beb sin nacer. Para ms informacin hable con su profesional de la salud o su farmacutico. No debe amamantar a un beb mientras est tomando este medicamento. Consulte a su mdico o su profesional de la salud si tiene un ataque grave de diarrea, nuseas y vmito o si suda demasiado. La prdida demasiado de lquido del cuerpo puede ser peligroso para que usted toma [employer]. Puede experimentar mareos. No conduzca ni utilice maquinaria ni haga nada que Associate Professor en estado de alerta hasta que sepa cmo le afecta este medicamento. No se siente ni se ponga de pie con rapidez, especialmente si es un paciente de edad avanzada. Esto reduce el riesgo de mareos o [employer]. Qu efectos secundarios puedo tener al [employer] este medicamento? Efectos secundarios que debe informar a su mdico o a Barrister's clerk de la salud tan pronto como sea posible: Chief of Staff como erupcin cutnea, picazn o urticarias, hinchazn de la cara, labios o lengua confusin mareos sensacin de desmayos o aturdimiento fiebre o escalofros palpitaciones signos y sntomas  de un cogulo sanguneo  tales como problemas respiratorios; cambios en la visin; dolor en el pecho; dolor de cabeza severo, repentino; dolor, hinchazn, clida en la pierna; dificultad para hablar; entumecimiento o debilidad repentina de la cara, brazo o pierna signos y sntomas de lesin al rin como dificultad para Garment/textile technologist o cambios en la cantidad de orina signos y sntomas de lesin al hgado como orina amarillo oscuro o Forensic psychologist; sensacin general de estar enfermo o sntomas gripales; heces claras; prdida de apetito; nuseas; dolor en la regin abdominal superior derecha; cansancio o debilidad inusual; color amarillento de los ojos o la piel sangrado, magulladuras inusuales Efectos secundarios que, por lo general, no requieren Geophysical data processor (debe informarlos a su mdico o a Barrister's clerk de la salud si persisten o si son molestos): dolor de espalda tos diarrea dolor de cabeza calambres musculares vmito Puede ser que esta lista no menciona todos los posibles efectos secundarios. Comunquese a su mdico por asesoramiento mdico [employer]. Usted puede informar los efectos secundarios a la FDA por telfono al 1-800-FDA-1088. Dnde debo guardar mi medicina? Este medicamento se administra en hospitales o clnicas y no necesitar guardarlo en su domicilio. ATENCIN: Este folleto es un resumen. Puede ser que no cubra toda la posible informacin. Si usted tiene preguntas acerca de esta medicina, consulte con su mdico, su farmacutico o su profesional de Technical sales engineer.    2016, Elsevier/Gold Standard. (2014-03-12 00:00:00)   [facility] Discharge Instructions for Patients Receiving Chemotherapy  Today you received the following chemotherapy agents:  Kyprolis  To help prevent nausea and vomiting after your treatment, we encourage you to take your nausea medication as prescribed.   If you develop nausea and vomiting that is not controlled by your nausea medication, call the clinic.   BELOW  ARE SYMPTOMS THAT SHOULD BE REPORTED IMMEDIATELY:  *FEVER GREATER THAN 100.5 F  *CHILLS WITH OR WITHOUT FEVER  NAUSEA AND VOMITING THAT IS NOT CONTROLLED WITH YOUR NAUSEA MEDICATION  *UNUSUAL SHORTNESS OF BREATH  *UNUSUAL BRUISING OR BLEEDING  TENDERNESS IN MOUTH AND THROAT WITH OR WITHOUT PRESENCE OF ULCERS  *URINARY PROBLEMS  *BOWEL PROBLEMS  UNUSUAL RASH Items with * indicate a potential emergency and should be followed up as soon as possible.  Feel free to call the clinic you have any questions or concerns. The clinic phone number is [phone].  Please show the [place] at check-in to the Emergency Department and triage nurse.

## 2015-06-15 ENCOUNTER — Encounter: Payer: Self-pay | Admitting: Oncology

## 2015-06-15 NOTE — Progress Notes (Signed)
I faxed form for kyprolis drug replacement amgen [phone] for 06/12/15 dos

## 2015-06-15 NOTE — Progress Notes (Signed)
Sent bobbi-billing April -- [id number] kyprolis for drug replacement

## 2015-06-16 LAB — MULTIPLE MYELOMA PANEL, SERUM
ALBUMIN/GLOB SERPL: 0.4 — AB (ref 0.7–1.7)
ALPHA2 GLOB SERPL ELPH-MCNC: 1.8 g/dL — AB (ref 0.4–1.0)
Albumin SerPl Elph-Mcnc: 1.8 g/dL — ABNORMAL LOW (ref 2.9–4.4)
Alpha 1: 0.5 g/dL — ABNORMAL HIGH (ref 0.0–0.4)
B-GLOBULIN SERPL ELPH-MCNC: 0.7 g/dL (ref 0.7–1.3)
Gamma Glob SerPl Elph-Mcnc: 1.8 g/dL (ref 0.4–1.8)
Globulin, Total: 4.8 g/dL — ABNORMAL HIGH (ref 2.2–3.9)
IgA, Qn, Serum: 19 mg/dL — ABNORMAL LOW (ref 90–386)
IgM, Qn, Serum: 18 mg/dL — ABNORMAL LOW (ref 20–172)
M PROTEIN SERPL ELPH-MCNC: 1.7 g/dL — AB
Total Protein: 6.6 g/dL (ref 6.0–8.5)

## 2015-06-23 ENCOUNTER — Other Ambulatory Visit (HOSPITAL_BASED_OUTPATIENT_CLINIC_OR_DEPARTMENT_OTHER): Payer: Self-pay

## 2015-06-23 ENCOUNTER — Encounter (HOSPITAL_COMMUNITY): Payer: Self-pay | Admitting: Emergency Medicine

## 2015-06-23 ENCOUNTER — Ambulatory Visit (HOSPITAL_BASED_OUTPATIENT_CLINIC_OR_DEPARTMENT_OTHER): Payer: Self-pay | Admitting: Nurse Practitioner

## 2015-06-23 ENCOUNTER — Inpatient Hospital Stay (HOSPITAL_COMMUNITY): Payer: Medicaid Other

## 2015-06-23 ENCOUNTER — Ambulatory Visit (HOSPITAL_BASED_OUTPATIENT_CLINIC_OR_DEPARTMENT_OTHER): Payer: Self-pay

## 2015-06-23 ENCOUNTER — Inpatient Hospital Stay (HOSPITAL_COMMUNITY)
Admission: EM | Admit: 2015-06-23 | Discharge: 2015-07-10 | DRG: 840 | Disposition: A | Discharge status: Home / Self Care | Payer: Medicaid Other | Attending: Internal Medicine | Admitting: Internal Medicine

## 2015-06-23 VITALS — BP 178/102 | HR 100 | Temp 97.8°F | Resp 18

## 2015-06-23 DIAGNOSIS — E7800 Pure hypercholesterolemia, unspecified: Secondary | ICD-10-CM | POA: Diagnosis present

## 2015-06-23 DIAGNOSIS — N179 Acute kidney failure, unspecified: Secondary | ICD-10-CM | POA: Diagnosis present

## 2015-06-23 DIAGNOSIS — C9002 Multiple myeloma in relapse: Secondary | ICD-10-CM

## 2015-06-23 DIAGNOSIS — C7951 Secondary malignant neoplasm of bone: Secondary | ICD-10-CM

## 2015-06-23 DIAGNOSIS — Z992 Dependence on renal dialysis: Secondary | ICD-10-CM | POA: Diagnosis not present

## 2015-06-23 DIAGNOSIS — E860 Dehydration: Secondary | ICD-10-CM | POA: Diagnosis present

## 2015-06-23 DIAGNOSIS — I120 Hypertensive chronic kidney disease with stage 5 chronic kidney disease or end stage renal disease: Secondary | ICD-10-CM | POA: Diagnosis present

## 2015-06-23 DIAGNOSIS — N186 End stage renal disease: Secondary | ICD-10-CM | POA: Diagnosis present

## 2015-06-23 DIAGNOSIS — Z833 Family history of diabetes mellitus: Secondary | ICD-10-CM

## 2015-06-23 DIAGNOSIS — M899 Disorder of bone, unspecified: Secondary | ICD-10-CM

## 2015-06-23 DIAGNOSIS — I160 Hypertensive urgency: Secondary | ICD-10-CM | POA: Diagnosis present

## 2015-06-23 DIAGNOSIS — Z79899 Other long term (current) drug therapy: Secondary | ICD-10-CM | POA: Diagnosis not present

## 2015-06-23 DIAGNOSIS — C9000 Multiple myeloma not having achieved remission: Secondary | ICD-10-CM

## 2015-06-23 DIAGNOSIS — I6783 Posterior reversible encephalopathy syndrome: Secondary | ICD-10-CM

## 2015-06-23 DIAGNOSIS — R197 Diarrhea, unspecified: Secondary | ICD-10-CM

## 2015-06-23 DIAGNOSIS — H538 Other visual disturbances: Secondary | ICD-10-CM

## 2015-06-23 DIAGNOSIS — R509 Fever, unspecified: Secondary | ICD-10-CM | POA: Insufficient documentation

## 2015-06-23 DIAGNOSIS — N183 Chronic kidney disease, stage 3 unspecified: Secondary | ICD-10-CM

## 2015-06-23 DIAGNOSIS — M898X9 Other specified disorders of bone, unspecified site: Secondary | ICD-10-CM

## 2015-06-23 DIAGNOSIS — G893 Neoplasm related pain (acute) (chronic): Secondary | ICD-10-CM

## 2015-06-23 DIAGNOSIS — R112 Nausea with vomiting, unspecified: Secondary | ICD-10-CM

## 2015-06-23 DIAGNOSIS — R29898 Other symptoms and signs involving the musculoskeletal system: Secondary | ICD-10-CM

## 2015-06-23 DIAGNOSIS — E042 Nontoxic multinodular goiter: Secondary | ICD-10-CM | POA: Diagnosis present

## 2015-06-23 DIAGNOSIS — D649 Anemia, unspecified: Secondary | ICD-10-CM | POA: Diagnosis present

## 2015-06-23 DIAGNOSIS — R1110 Vomiting, unspecified: Secondary | ICD-10-CM

## 2015-06-23 DIAGNOSIS — N189 Chronic kidney disease, unspecified: Secondary | ICD-10-CM

## 2015-06-23 DIAGNOSIS — M549 Dorsalgia, unspecified: Secondary | ICD-10-CM | POA: Insufficient documentation

## 2015-06-23 DIAGNOSIS — I10 Essential (primary) hypertension: Secondary | ICD-10-CM

## 2015-06-23 DIAGNOSIS — Z4931 Encounter for adequacy testing for hemodialysis: Secondary | ICD-10-CM

## 2015-06-23 DIAGNOSIS — N390 Urinary tract infection, site not specified: Secondary | ICD-10-CM

## 2015-06-23 DIAGNOSIS — R531 Weakness: Secondary | ICD-10-CM | POA: Diagnosis present

## 2015-06-23 DIAGNOSIS — D72829 Elevated white blood cell count, unspecified: Secondary | ICD-10-CM

## 2015-06-23 DIAGNOSIS — R21 Rash and other nonspecific skin eruption: Secondary | ICD-10-CM | POA: Diagnosis not present

## 2015-06-23 LAB — COMPREHENSIVE METABOLIC PANEL
ALK PHOS: 152 U/L — AB (ref 38–126)
ALT: 23 U/L (ref 17–63)
ANION GAP: 11 (ref 5–15)
AST: 12 U/L — ABNORMAL LOW (ref 15–41)
Albumin: 1.9 g/dL — ABNORMAL LOW (ref 3.5–5.0)
BUN: 66 mg/dL — ABNORMAL HIGH (ref 6–20)
CALCIUM: 12.2 mg/dL — AB (ref 8.9–10.3)
CO2: 21 mmol/L — AB (ref 22–32)
Chloride: 101 mmol/L (ref 101–111)
Creatinine, Ser: 4.25 mg/dL — ABNORMAL HIGH (ref 0.61–1.24)
GFR calc non Af Amer: 16 mL/min — ABNORMAL LOW (ref >60)
GFR, EST AFRICAN AMERICAN: 19 mL/min — AB (ref >60)
Glucose, Bld: 115 mg/dL — ABNORMAL HIGH (ref 65–99)
POTASSIUM: 4 mmol/L (ref 3.5–5.1)
SODIUM: 133 mmol/L — AB (ref 135–145)
Total Bilirubin: 0.4 mg/dL (ref 0.3–1.2)
Total Protein: 7.9 g/dL (ref 6.5–8.1)

## 2015-06-23 LAB — CBC WITH DIFFERENTIAL/PLATELET
BASO%: 0.1 % (ref 0.0–2.0)
BASOS ABS: 0 10*3/uL (ref 0.0–0.1)
BASOS PCT: 0 %
Basophils Absolute: 0 10*3/uL (ref 0.0–0.1)
EOS ABS: 0 10*3/uL (ref 0.0–0.7)
EOS%: 0.1 % (ref 0.0–7.0)
Eosinophils Absolute: 0 10*3/uL (ref 0.0–0.5)
Eosinophils Relative: 0 %
HCT: 29 % — ABNORMAL LOW (ref 38.4–49.9)
HCT: 27.6 % — ABNORMAL LOW (ref 39.0–52.0)
HEMOGLOBIN: 9.1 g/dL — AB (ref 13.0–17.0)
HGB: 9.6 g/dL — ABNORMAL LOW (ref 13.0–17.1)
LYMPH%: 8.5 % — ABNORMAL LOW (ref 14.0–49.0)
LYMPHS PCT: 14 %
Lymphs Abs: 3 10*3/uL (ref 0.7–4.0)
MCH: 30.8 pg (ref 27.2–33.4)
MCH: 30.8 pg (ref 26.0–34.0)
MCHC: 33.1 g/dL (ref 32.0–36.0)
MCHC: 33 g/dL (ref 30.0–36.0)
MCV: 93.3 fL (ref 79.3–98.0)
MCV: 93.6 fL (ref 78.0–100.0)
MONO#: 1.3 10*3/uL — ABNORMAL HIGH (ref 0.1–0.9)
MONO%: 6.4 % (ref 0.0–14.0)
MONOS PCT: 8 %
Monocytes Absolute: 1.7 10*3/uL — ABNORMAL HIGH (ref 0.1–1.0)
NEUT#: 16.8 10*3/uL — ABNORMAL HIGH (ref 1.5–6.5)
NEUT%: 84.9 % — ABNORMAL HIGH (ref 39.0–75.0)
NEUTROS PCT: 78 %
Neutro Abs: 16.5 10*3/uL — ABNORMAL HIGH (ref 1.7–7.7)
Platelets: 785 10*3/uL — ABNORMAL HIGH (ref 140–400)
Platelets: 852 10*3/uL — ABNORMAL HIGH (ref 150–400)
RBC: 3.11 10*6/uL — ABNORMAL LOW (ref 4.20–5.82)
RBC: 2.95 MIL/uL — ABNORMAL LOW (ref 4.22–5.81)
RDW: 14.5 % (ref 11.0–14.6)
RDW: 14.1 % (ref 11.5–15.5)
WBC: 19.8 10*3/uL — ABNORMAL HIGH (ref 4.0–10.3)
WBC: 21.2 10*3/uL — ABNORMAL HIGH (ref 4.0–10.5)
lymph#: 1.7 10*3/uL (ref 0.9–3.3)

## 2015-06-23 LAB — BASIC METABOLIC PANEL
Anion Gap: 11 mEq/L (ref 3–11)
BUN: 63.9 mg/dL — ABNORMAL HIGH (ref 7.0–26.0)
CHLORIDE: 104 meq/L (ref 98–109)
CO2: 20 meq/L — AB (ref 22–29)
Calcium: 13.4 mg/dL (ref 8.4–10.4)
Creatinine: 4.3 mg/dL (ref 0.7–1.3)
EGFR: 16 mL/min/{1.73_m2} — AB (ref >90)
Glucose: 139 mg/dl (ref 70–140)
POTASSIUM: 4.2 meq/L (ref 3.5–5.1)
SODIUM: 134 meq/L — AB (ref 136–145)

## 2015-06-23 LAB — URINALYSIS, ROUTINE W REFLEX MICROSCOPIC
Bilirubin Urine: NEGATIVE
Glucose, UA: NEGATIVE mg/dL
Ketones, ur: NEGATIVE mg/dL
NITRITE: NEGATIVE
Protein, ur: >300 mg/dL — AB
SPECIFIC GRAVITY, URINE: 1.013 (ref 1.005–1.030)
pH: 5.5 (ref 5.0–8.0)

## 2015-06-23 LAB — URINE MICROSCOPIC-ADD ON

## 2015-06-23 LAB — I-STAT CG4 LACTIC ACID, ED: LACTIC ACID, VENOUS: 0.78 mmol/L (ref 0.5–2.0)

## 2015-06-23 MED ORDER — SODIUM CHLORIDE 0.9% FLUSH: 3.0000 mL | INTRAVENOUS | Status: DC | PRN

## 2015-06-23 MED ORDER — ONDANSETRON HCL 4 MG/2ML IJ SOLN: 4.0000 mg | Freq: Four times a day (QID) | INTRAMUSCULAR | Status: DC | PRN

## 2015-06-23 MED ORDER — SODIUM CHLORIDE 0.9 % IV BOLUS (SEPSIS)
250.0000 mL | Freq: Once | INTRAVENOUS | Status: AC
  Administered 2015-06-23: 250 mL via INTRAVENOUS

## 2015-06-23 MED ORDER — SODIUM CHLORIDE 0.9 % IV SOLN: 250.0000 mL | Freq: Once | INTRAVENOUS | Status: DC

## 2015-06-23 MED ORDER — HEPARIN SOD (PORK) LOCK FLUSH 100 UNIT/ML IV SOLN
500.0000 [IU] | Freq: Every day | INTRAVENOUS | Status: DC | PRN
  Filled 2015-06-23: qty 5

## 2015-06-23 MED ORDER — SODIUM CHLORIDE 0.9 % IV BOLUS (SEPSIS)
1000.0000 mL | Freq: Once | INTRAVENOUS | Status: AC
  Administered 2015-06-23: 1000 mL via INTRAVENOUS

## 2015-06-23 MED ORDER — ONDANSETRON HCL 4 MG/2ML IJ SOLN
4.0000 mg | Freq: Once | INTRAMUSCULAR | Status: AC
  Administered 2015-06-23: 4 mg via INTRAVENOUS
  Filled 2015-06-23: qty 2

## 2015-06-23 MED ORDER — DEXTROSE 5 % IV SOLN
1.0000 g | Freq: Once | INTRAVENOUS | Status: AC
  Administered 2015-06-23: 1 g via INTRAVENOUS
  Filled 2015-06-23: qty 10

## 2015-06-23 MED ORDER — MORPHINE SULFATE (PF) 4 MG/ML IV SOLN
4.0000 mg | Freq: Once | INTRAVENOUS | Status: AC
  Administered 2015-06-23: 4 mg via INTRAVENOUS
  Filled 2015-06-23: qty 1

## 2015-06-23 MED ORDER — SODIUM CHLORIDE 0.9% FLUSH: 10.0000 mL | INTRAVENOUS | Status: DC | PRN

## 2015-06-23 MED ORDER — HEPARIN SOD (PORK) LOCK FLUSH 100 UNIT/ML IV SOLN
250.0000 [IU] | INTRAVENOUS | Status: DC | PRN
  Filled 2015-06-23: qty 3

## 2015-06-23 MED ORDER — ENOXAPARIN SODIUM 30 MG/0.3ML ~~LOC~~ SOLN
30.0000 mg | SUBCUTANEOUS | Status: DC
  Administered 2015-06-23 – 2015-07-04 (×10): 30 mg via SUBCUTANEOUS
  Filled 2015-06-23 (×10): qty 0.3

## 2015-06-23 MED ORDER — SODIUM CHLORIDE 0.9 % IV SOLN
INTRAVENOUS | Status: DC
  Administered 2015-06-23 – 2015-06-26 (×4): via INTRAVENOUS

## 2015-06-23 MED ORDER — ACETAMINOPHEN 500 MG PO TABS: 500.0000 mg | ORAL_TABLET | Freq: Four times a day (QID) | ORAL | Status: DC | PRN

## 2015-06-23 MED ORDER — ONDANSETRON HCL 4 MG/2ML IJ SOLN: 4.0000 mg | Freq: Three times a day (TID) | INTRAMUSCULAR | Status: DC | PRN

## 2015-06-23 MED ORDER — ACYCLOVIR 200 MG PO CAPS
400.0000 mg | ORAL_CAPSULE | Freq: Every day | ORAL | Status: DC
  Administered 2015-06-24 – 2015-06-26 (×3): 400 mg via ORAL
  Filled 2015-06-23 (×4): qty 2

## 2015-06-23 MED ORDER — ONDANSETRON HCL 4 MG PO TABS: 4.0000 mg | ORAL_TABLET | Freq: Four times a day (QID) | ORAL | Status: DC | PRN

## 2015-06-23 MED ORDER — MORPHINE SULFATE (PF) 2 MG/ML IV SOLN
2.0000 mg | INTRAVENOUS | Status: DC | PRN
  Administered 2015-06-25 (×2): 2 mg via INTRAVENOUS
  Filled 2015-06-23 (×2): qty 1

## 2015-06-23 MED ORDER — DEXTROSE 5 % IV SOLN
1.0000 g | INTRAVENOUS | Status: DC
  Administered 2015-06-24 – 2015-06-25 (×2): 1 g via INTRAVENOUS
  Filled 2015-06-23 (×2): qty 10

## 2015-06-23 MED ORDER — TRAMADOL HCL 50 MG PO TABS: 50.0000 mg | ORAL_TABLET | Freq: Four times a day (QID) | ORAL | Status: DC | PRN

## 2015-06-23 MED ORDER — SODIUM CHLORIDE 0.9 % IV SOLN
INTRAVENOUS | Status: DC
  Administered 2015-06-23: 13:00:00 via INTRAVENOUS

## 2015-06-23 NOTE — ED Notes (Signed)
Attempted to call primary nurse for room 1338 to give report. She is unable to take report and will call back to the ED.

## 2015-06-23 NOTE — ED Notes (Addendum)
Pt from [facility], has N/V/D x4 days. Was scheduled for chemo today. Last treatment was on Friday, 5/19.

## 2015-06-23 NOTE — ED Notes (Signed)
Admitting physician would like to hold on the CT til she exams patient. Stacy RN (Camera operator) has notified CT of the hold for the scan.

## 2015-06-23 NOTE — ED Provider Notes (Signed)
CSN: [id number]     Arrival date & time 06/23/15  1341 History   First MD Initiated Contact with Patient 06/23/15 1404     Chief Complaint  Patient presents with  . Emesis     (Consider location/radiation/quality/duration/timing/severity/associated sxs/prior Treatment) HPI   Pt with hx multiple myeloma (relapse, stage IIA) on chemotherapy (last 5/19), CKD III p/w N/V/Dx 4 days.  Is having equal amounts of vomiting and diarrhea, both nonbloody.  Has had chills, generalized abdominal pain, generalized weakness, dysuria, dark urine.  Has had mid-back pain since the treatment.  Has taken tramadol without relief.  His family was sick with similar last week.  The symptoms began the day of his last chemotherapy.      Past Medical History  Diagnosis Date  . IgG myeloma ([place])   . CKD (chronic kidney disease), stage III   . Hypercholesterolemia   . Lytic bone lesions on xray    History reviewed. No pertinent past surgical history. Family History  Problem Relation Age of Onset  . Diabetes Father    Social History  Substance Use Topics  . Smoking status: Never Smoker   . Smokeless tobacco: Never Used  . Alcohol Use: No    Review of Systems  All other systems reviewed and are negative.     Allergies  Ciprofloxacin and Mobic  Home Medications   Prior to Admission medications   Medication Sig Start Date End Date Taking? Authorizing Provider  acyclovir (ZOVIRAX) 200 MG capsule Take 2 capsules (400 mg total) by mouth daily. 06/11/15   [name], MD  dexamethasone (DECADRON) 4 MG tablet Take 5 tablets (5m) twice a week, not on treatment day (tome 5[place]veces a la semana, no en el dia de tratamiento) 06/11/15   GChauncey Cruel MD  prochlorperazine (COMPAZINE) 10 MG tablet Take 1 tablet (10 mg total) by mouth every 6 (six) hours as needed for nausea or vomiting. 06/11/15   GChauncey Cruel MD  traMADol (ULTRAM) 50 MG tablet Take 1 tablet (50 mg total) by mouth  every 6 (six) hours as needed. 06/11/15   GVirgie DadMagrinat, MD   BP 193/105 mmHg  Pulse 96  Temp(Src) 97.7 F (36.5 C) (Oral)  Resp 24  SpO2 97% Physical Exam  Constitutional: He appears well-developed and well-nourished. No distress.  Ill appearing  HENT:  Head: Normocephalic and atraumatic.  Neck: Neck supple.  Cardiovascular: Normal rate and regular rhythm.   Pulmonary/Chest: Effort normal and breath sounds normal. No respiratory distress. He has no wheezes. He has no rales.  Abdominal: Soft. He exhibits no distension and no mass. There is tenderness (diffuse). There is no rebound and no guarding.  Neurological: He is alert. He exhibits normal muscle tone.  Skin: Skin is warm. He is not diaphoretic.  Nursing note and vitals reviewed.   ED Course  Procedures (including critical care time) Labs Review Labs Reviewed  COMPREHENSIVE METABOLIC PANEL - Abnormal; Notable for the following:    Sodium 133 (*)    CO2 21 (*)    Glucose, Bld 115 (*)    BUN 66 (*)    Creatinine, Ser 4.25 (*)    Calcium 12.2 (*)    Albumin 1.9 (*)    AST 12 (*)    Alkaline Phosphatase 152 (*)    GFR calc non Af Amer 16 (*)    GFR calc Af Amer 19 (*)    All other components within normal limits  CBC  WITH DIFFERENTIAL/PLATELET - Abnormal; Notable for the following:    WBC 21.2 (*)    RBC 2.95 (*)    Hemoglobin 9.1 (*)    HCT 27.6 (*)    Platelets 852 (*)    Neutro Abs 16.5 (*)    Monocytes Absolute 1.7 (*)    All other components within normal limits  URINALYSIS, ROUTINE W REFLEX MICROSCOPIC (NOT AT [facility]) - Abnormal; Notable for the following:    Color, Urine RED (*)    APPearance CLOUDY (*)    Hgb urine dipstick LARGE (*)    Protein, ur >300 (*)    Leukocytes, UA SMALL (*)    All other components within normal limits  URINE MICROSCOPIC-ADD ON - Abnormal; Notable for the following:    Squamous Epithelial / LPF 0-5 (*)    Bacteria, UA FEW (*)    Casts GRANULAR CAST (*)    All other  components within normal limits  CULTURE, BLOOD (ROUTINE X 2)  CULTURE, BLOOD (ROUTINE X 2)  URINE CULTURE  I-STAT CG4 LACTIC ACID, ED    Imaging Review No results found. I have personally reviewed and evaluated these images and lab results as part of my medical decision-making.   EKG Interpretation None       4:00 PM Reexamination of the abdomen is unchanged.  Mild diffuse tenderness.  No distension, guarding, or rebound.    MDM   Final diagnoses:  Acute on chronic renal failure (HCC)  UTI (lower urinary tract infection)  Vomiting and diarrhea    Ill-appearing pt with multiple myeloma on chemotherapy and CKD p/w N/V/D x 4 days.  Has had sick contacts, also developed symptoms the last day of chemotherapy.  Labs concerning for acute on chronic renal failure.  UA appears infected.  Culture pending.  Rocephin started.  CT renal study added given back pain and hematuria, though I have low suspicion for kidney stone.  Back pain more likely related to bone lesions for multiple myeloma.  Discussed pt, workup, and plan with Dr Venora Maples.  Admitted to [facility], Dr Broadus John accepting.      Clayton Bibles, PA-C 06/23/15 [place], MD 06/24/15 1004

## 2015-06-23 NOTE — Progress Notes (Signed)
At [facility], NP called and reported bp- 178/102 and pulse of 100. Reported Diarrhea for the last 4 day with last episode today. Has been unable to eat for the last 4 days due to nausea and vomiting. Last episode of vomiting yesterday. Selena Lesser, NP to see and to hold treatment today.  At [place], NP to bedside to see and review today's lab results.  At 1333 via wheelchair with wife at side, taken to emergency department.  Report given to RN.

## 2015-06-23 NOTE — H&P (Signed)
History and Physical    Charles [name] [id number]:[id number] DOB: 1975/09/15 DOA: 06/23/2015  Referring MD/NP/PA: EDP PCP: [name], MD  Outpatient Specialists: Dr.Magrinat Patient coming from: Home  Chief Complaint: nausea, vomiting, weakness  HPI: Charles [name] is a 40 y.o. male with medical history significant of Multiple Myeloma, CKD 3, back pain, currently undergoing chemotherapy, reports he was started on a new chemotherapeutic agent recently, last chemo was last week, went to the cancer center with Nausea, Vomiting, diarrhea, fever and chills x4days and was sent to the ER. She reports feeling feverish for last couple of day and chills too. ED Course: Temp of 99.1, labs notable for creatinine of 4.2 up from 3.3 10days ago  Review of Systems: As per HPI otherwise 10 point review of systems negative.    Past Medical History  Diagnosis Date  . IgG myeloma ([place])   . CKD (chronic kidney disease), stage III   . Hypercholesterolemia   . Lytic bone lesions on xray     History reviewed. No pertinent past surgical history.   reports that he has never smoked. He has never used smokeless tobacco. He reports that he does not drink alcohol or use illicit drugs.  Allergies  Allergen Reactions  . Ciprofloxacin Rash  . Mobic [Meloxicam] Rash    Family History  Problem Relation Age of Onset  . Diabetes Father     Prior to Admission medications   Medication Sig Start Date End Date Taking? Authorizing Provider  acetaminophen (TYLENOL) 500 MG tablet Take 500 mg by mouth every 6 (six) hours as needed for mild pain, moderate pain, fever or headache.   Yes Historical Provider, MD  acyclovir (ZOVIRAX) 200 MG capsule Take 2 capsules (400 mg total) by mouth daily. Patient taking differently: Take 400 mg by mouth daily. Scheduled everyday 06/11/15  Yes [name], MD  Cholecalciferol (VITAMIN D PO) Take 500 Units by mouth daily.   Yes Historical Provider, MD    dexamethasone (DECADRON) 4 MG tablet Take 5 tablets (25m) twice a week, not on treatment day (tome 5 pastillas dos veces a la semana, no en el dia de tratamiento) 06/11/15  Yes GChauncey Cruel MD  prochlorperazine (COMPAZINE) 10 MG tablet Take 1 tablet (10 mg total) by mouth every 6 (six) hours as needed for nausea or vomiting. 06/11/15  Yes GChauncey Cruel MD  traMADol (ULTRAM) 50 MG tablet Take 1 tablet (50 mg total) by mouth every 6 (six) hours as needed. Patient taking differently: Take 50 mg by mouth every 6 (six) hours as needed for moderate pain.  06/11/15  Yes GChauncey Cruel MD    Physical Exam: Filed Vitals:   06/23/15 1433 06/23/15 1436 06/23/15 1532 06/23/15 1600  BP:   157/94 173/93  Pulse:   97 100  Temp:  99.1 F (37.3 C) 98.3 F (36.8 C)   TempSrc:  Rectal Oral   Resp:   20 20  Height: 5' 2"  (1.575 m)     Weight: 67.132 kg (148 lb)     SpO2:   98% 98%       Filed Vitals:   06/23/15 1433 06/23/15 1436 06/23/15 1532 06/23/15 1600  BP:   157/94 173/93  Pulse:   97 100  Temp:  99.1 F (37.3 C) 98.3 F (36.8 C)   TempSrc:  Rectal Oral   Resp:   20 20  Height: 5' 2"  (1.575 m)     Weight: 67.132 kg (148 lb)  SpO2:   98% 98%  Constitutional: NAD, calm, comfortable, AAOx3  Eyes: PERRL, lids and conjunctivae normal ENMT: Mucous membranes are moist. Posterior pharynx clear of any exudate or lesions.Normal dentition.  Neck: normal, supple, no masses, no thyromegaly Respiratory: clear to auscultation bilaterally, no wheezing, no crackles. Normal respiratory effort. No accessory muscle use.  Cardiovascular: Regular rate and rhythm, no murmurs / rubs / gallops. No extremity edema. 2+ pedal pulses. No carotid bruits.  Abdomen: no tenderness, no masses palpated. No hepatosplenomegaly. Bowel sounds positive.  Musculoskeletal: no clubbing / cyanosis. No joint deformity upper and lower extremities. Good ROM, no contractures. Normal muscle tone.  Skin: no rashes,  lesions, ulcers. No induration Neurologic: CN 2-12 grossly intact. Sensation intact, DTR normal. Strength 5/5 in all 4.  Psychiatric: Normal judgment and insight. Alert and oriented x 3. Normal mood.   Labs on Admission: I have personally reviewed following labs and imaging studies  CBC:  Recent Labs Lab 06/23/15 1215 06/23/15 1450  WBC 19.8* 21.2*  NEUTROABS 16.8* 16.5*  HGB 9.6* 9.1*  HCT 29.0* 27.6*  MCV 93.3 93.6  PLT 785* 573*   Basic Metabolic Panel:  Recent Labs Lab 06/23/15 1215 06/23/15 1450  NA 134* 133*  K 4.2 4.0  CL  --  101  CO2 20* 21*  GLUCOSE 139 115*  BUN 63.9* 66*  CREATININE 4.3 Repeated and Verified* 4.25*  CALCIUM 13.4 Repeated and Verified* 12.2*   GFR: Estimated Creatinine Clearance: 19.5 mL/min (by C-G formula based on Cr of 4.25). Liver Function Tests:  Recent Labs Lab 06/23/15 1450  AST 12*  ALT 23  ALKPHOS 152*  BILITOT 0.4  PROT 7.9  ALBUMIN 1.9*   No results for input(s): LIPASE, AMYLASE in the last 168 hours. No results for input(s): AMMONIA in the last 168 hours. Coagulation Profile: No results for input(s): INR, PROTIME in the last 168 hours. Cardiac Enzymes: No results for input(s): CKTOTAL, CKMB, CKMBINDEX, TROPONINI in the last 168 hours. BNP (last 3 results) No results for input(s): PROBNP in the last 8760 hours. HbA1C: No results for input(s): HGBA1C in the last 72 hours. CBG: No results for input(s): GLUCAP in the last 168 hours. Lipid Profile: No results for input(s): CHOL, HDL, LDLCALC, TRIG, CHOLHDL, LDLDIRECT in the last 72 hours. Thyroid Function Tests: No results for input(s): TSH, T4TOTAL, FREET4, T3FREE, THYROIDAB in the last 72 hours. Anemia Panel: No results for input(s): VITAMINB12, FOLATE, FERRITIN, TIBC, IRON, RETICCTPCT in the last 72 hours. Urine analysis:    Component Value Date/Time   COLORURINE RED* 06/23/2015 1430   APPEARANCEUR CLOUDY* 06/23/2015 1430   LABSPEC 1.013 06/23/2015 1430    LABSPEC 1.030 01/22/2015 1018   PHURINE 5.5 06/23/2015 1430   PHURINE 5.0 01/22/2015 1018   GLUCOSEU NEGATIVE 06/23/2015 1430   GLUCOSEU Negative 01/22/2015 1018   HGBUR LARGE* 06/23/2015 1430   HGBUR Large 01/22/2015 1018   BILIRUBINUR NEGATIVE 06/23/2015 1430   BILIRUBINUR Negative 01/22/2015 1018   KETONESUR NEGATIVE 06/23/2015 1430   KETONESUR Negative 01/22/2015 1018   PROTEINUR >300* 06/23/2015 1430   PROTEINUR 2000 01/22/2015 1018   UROBILINOGEN 0.2 01/22/2015 1018   UROBILINOGEN 0.2 01/31/2014 1050   NITRITE NEGATIVE 06/23/2015 1430   NITRITE Negative 01/22/2015 1018   LEUKOCYTESUR SMALL* 06/23/2015 1430   LEUKOCYTESUR Color Interference due to blood in urine 01/22/2015 1018   Sepsis Labs: @LABRCNTIP (procalcitonin:4,lacticidven:4) )No results found for this or any previous visit (from the past 240 hour(s)).   Radiological Exams on Admission: No  results found.  EKG: Independently reviewed. NSR< no acute St t wave changes  Assessment/Plan    AKI (acute kidney injury) ([place]) -on CKD 3, baseline is 3.3 -due to GI losses and poor Po intake -hydrate, monitor -FU [place]    Multiple myeloma ([place]) -on chemo, followed by Dr.Magrinat, will notify him of admission -elevated WBC could be due to hemoconcentration, decadron with chemo +/- infection   Abnormal UA -some symptoms with urination reported -start IV ceftriaxone, FU cultures, monitor    Nausea with vomiting -could be from chemo, ? Infection -supportive care, hydrate, Abx, monitor -clears, advance as tolerated  DVT prophylaxis: Lovenox  Code Status:Full Code Family Communication: None at bedside Disposition Plan: Home in 1-2days  Admission status: inpatient  Domenic Polite MD [facility] Pager [phone]  If 7PM-7AM, please contact night-coverage [email or web address] Password [facility]  06/23/2015, 4:44 PM

## 2015-06-23 NOTE — ED Notes (Signed)
Notified CT that patient is ready for scan.

## 2015-06-23 NOTE — ED Notes (Signed)
Bed: KM62 Expected date:  Expected time:  Means of arrival:  Comments: Cancer center

## 2015-06-23 NOTE — ED Notes (Signed)
Provider at bedside

## 2015-06-24 ENCOUNTER — Encounter: Payer: Self-pay | Admitting: Nurse Practitioner

## 2015-06-24 ENCOUNTER — Inpatient Hospital Stay (HOSPITAL_COMMUNITY): Payer: Medicaid Other

## 2015-06-24 ENCOUNTER — Ambulatory Visit: Payer: Self-pay

## 2015-06-24 DIAGNOSIS — N179 Acute kidney failure, unspecified: Secondary | ICD-10-CM

## 2015-06-24 DIAGNOSIS — N183 Chronic kidney disease, stage 3 (moderate): Secondary | ICD-10-CM

## 2015-06-24 DIAGNOSIS — R112 Nausea with vomiting, unspecified: Secondary | ICD-10-CM

## 2015-06-24 DIAGNOSIS — R197 Diarrhea, unspecified: Secondary | ICD-10-CM

## 2015-06-24 DIAGNOSIS — C9002 Multiple myeloma in relapse: Principal | ICD-10-CM

## 2015-06-24 LAB — CBC
HCT: 24.6 % — ABNORMAL LOW (ref 39.0–52.0)
Hemoglobin: 8 g/dL — ABNORMAL LOW (ref 13.0–17.0)
MCH: 30.9 pg (ref 26.0–34.0)
MCHC: 32.5 g/dL (ref 30.0–36.0)
MCV: 95 fL (ref 78.0–100.0)
PLATELETS: 762 10*3/uL — AB (ref 150–400)
RBC: 2.59 MIL/uL — AB (ref 4.22–5.81)
RDW: 14.2 % (ref 11.5–15.5)
WBC: 15 10*3/uL — AB (ref 4.0–10.5)

## 2015-06-24 LAB — COMPREHENSIVE METABOLIC PANEL
ALT: 18 U/L (ref 17–63)
AST: 10 U/L — AB (ref 15–41)
Albumin: 1.6 g/dL — ABNORMAL LOW (ref 3.5–5.0)
Alkaline Phosphatase: 118 U/L (ref 38–126)
Anion gap: 9 (ref 5–15)
BUN: 60 mg/dL — AB (ref 6–20)
CHLORIDE: 108 mmol/L (ref 101–111)
CO2: 20 mmol/L — AB (ref 22–32)
CREATININE: 3.85 mg/dL — AB (ref 0.61–1.24)
Calcium: 11.4 mg/dL — ABNORMAL HIGH (ref 8.9–10.3)
GFR calc Af Amer: 21 mL/min — ABNORMAL LOW (ref >60)
GFR calc non Af Amer: 18 mL/min — ABNORMAL LOW (ref >60)
GLUCOSE: 95 mg/dL (ref 65–99)
Potassium: 4 mmol/L (ref 3.5–5.1)
SODIUM: 137 mmol/L (ref 135–145)
Total Bilirubin: 0.3 mg/dL (ref 0.3–1.2)
Total Protein: 6.7 g/dL (ref 6.5–8.1)

## 2015-06-24 MED ORDER — PROCHLORPERAZINE EDISYLATE 5 MG/ML IJ SOLN
10.0000 mg | Freq: Three times a day (TID) | INTRAMUSCULAR | Status: DC
  Administered 2015-06-24 – 2015-06-25 (×4): 10 mg via INTRAVENOUS
  Filled 2015-06-24 (×4): qty 2

## 2015-06-24 MED ORDER — CHOLESTYRAMINE 4 G PO PACK
4.0000 g | PACK | Freq: Two times a day (BID) | ORAL | Status: DC
  Administered 2015-06-24 – 2015-06-25 (×3): 4 g via ORAL
  Filled 2015-06-24 (×7): qty 1

## 2015-06-24 MED ORDER — SODIUM CHLORIDE 0.9 % IV SOLN
60.0000 mg | Freq: Once | INTRAVENOUS | Status: AC
  Administered 2015-06-24: 60 mg via INTRAVENOUS
  Filled 2015-06-24: qty 6.67
  Filled 2015-06-24: qty 20

## 2015-06-24 MED ORDER — HYDROCODONE-ACETAMINOPHEN 10-325 MG PO TABS
1.0000 | ORAL_TABLET | Freq: Four times a day (QID) | ORAL | Status: DC
  Administered 2015-06-24 (×4): 1 via ORAL
  Filled 2015-06-24 (×4): qty 1

## 2015-06-24 MED ORDER — MIRTAZAPINE 15 MG PO TABS
15.0000 mg | ORAL_TABLET | Freq: Every day | ORAL | Status: DC
  Administered 2015-06-24 – 2015-07-09 (×13): 15 mg via ORAL
  Filled 2015-06-24 (×14): qty 1

## 2015-06-24 MED ORDER — ENSURE ENLIVE PO LIQD
237.0000 mL | Freq: Three times a day (TID) | ORAL | Status: DC
  Administered 2015-06-24 – 2015-07-07 (×25): 237 mL via ORAL

## 2015-06-24 NOTE — Progress Notes (Signed)
PROGRESS NOTE    Charles [name]  OJJ:[id number] DOB: January 16, 1976 DOA: 06/23/2015 PCP: [name], MD   Brief Narrative: Charles [name] is a 40 y.o. male with medical history significant of Multiple Myeloma, CKD 3, back pain, currently undergoing chemotherapy, reports he was started on a new chemotherapeutic agent recently, last chemo was last week, went to the cancer center with Nausea, Vomiting, diarrhea, fever and chills x 4days and was sent to the ER. ED Course: Temp of 99.1, labs notable for creatinine of 4.2 up from 3.3 10 days ago   Assessment & Plan:   Principal Problem:   AKI (acute kidney injury) ([place]) Active Problems:   Multiple myeloma (HCC)   CKD (chronic kidney disease) stage 3, GFR 30-59 ml/min   Nausea with vomiting   AKI (acute kidney injury) ([place]) -on CKD 3, baseline is 3.3 -due to GI losses and poor Po intake -hydrate, monitor urine out put.  - [place] negative for hydronephrosis.  -cr trending down. Continue with IV fluids.    Multiple myeloma ([facility]CC) -on chemo, followed by Dr.Magrinat, = -elevated WBC could be due to hemoconcentration, decadron with chemo +/- infection -Appreciate Dr Jana Hakim Helps.   UTI;  Abnormal UA, some symptoms with urination reported -continue with  IV ceftriaxone, FU cultures, monitor   Nausea with vomiting -could be from chemo, ? Infection -supportive care, hydrate, Abx, monitor -improved. Advance diet as tolerated.     DVT prophylaxis; Lovenox.  Code Status: full code.  Family Communication: care discussed with wife.  Disposition Plan: remain inpatient, home tolerates diet, and renal function improved.    Consultants:   Dr Jana Hakim.   Procedures;   Renal US; medical renal disease, no hydronephrosis.   Antimicrobials:  Ceftriaxone 5-24   Subjective: Sleepy, per wife he just got pain medications.  He relates no significant abdominal pain. He denies further vomiting. No significant diarrhea,    Objective: Filed Vitals:   06/23/15 1855 06/23/15 2230 06/24/15 0525 06/24/15 1427  BP: 170/94 177/94 164/89 174/92  Pulse: 100 88 86 93  Temp: 98.7 F (37.1 C) 97.5 F (36.4 C) 98.2 F (36.8 C) 98.8 F (37.1 C)  TempSrc: Oral Oral Oral Oral  Resp: _0 Height:      Weight:      SpO2: 99% 97% 98% 99%    Intake/Output Summary (Last 24 hours) at 06/24/15 1449 Last data filed at 06/24/15 1232  Gross per 24 hour  Intake      0 ml  Output    600 ml  Net   -600 ml   Filed Weights   06/23/15 1430 06/23/15 1433  Weight: 67.132 kg (148 lb) 67.132 kg (148 lb)    Examination:  General exam: Appears calm and comfortable  Respiratory system: Clear to auscultation. Respiratory effort normal. Cardiovascular system: S1 & S2 heard, RRR. No JVD, murmurs, rubs, gallops or clicks. No pedal edema. Gastrointestinal system: Abdomen is nondistended, soft and nontender. No organomegaly or masses felt. Normal bowel sounds heard. Central nervous system: Alert and oriented. No focal neurological deficits. Extremities: Symmetric 5 x 5 power. Skin: No rashes, lesions or ulcers    Data Reviewed: I have personally reviewed following labs and imaging studies  CBC:  Recent Labs Lab 06/23/15 1215 06/23/15 1450 06/24/15 0338  WBC 19.8* 21.2* 15.0*  NEUTROABS 16.8* 16.5*  --   HGB 9.6* 9.1* 8.0*  HCT 29.0* 27.6* 24.6*  MCV 93.3 93.6 95.0  PLT 785* 852* 762*  Basic Metabolic Panel:  Recent Labs Lab 06/23/15 1215 06/23/15 1450 06/24/15 0338  NA 134* 133* 137  K 4.2 4.0 4.0  CL  --  101 108  CO2 20* 21* 20*  GLUCOSE 139 115* 95  BUN 63.9* 66* 60*  CREATININE 4.3 Repeated and Verified* 4.25* 3.85*  CALCIUM 13.4 Repeated and Verified* 12.2* 11.4*   GFR: Estimated Creatinine Clearance: 21.5 mL/min (by C-G formula based on Cr of 3.85). Liver Function Tests:  Recent Labs Lab 06/23/15 1450 06/24/15 0338  AST 12* 10*  ALT 23 18  ALKPHOS 152* 118  BILITOT 0.4 0.3   PROT 7.9 6.7  ALBUMIN 1.9* 1.6*   No results for input(s): LIPASE, AMYLASE in the last 168 hours. No results for input(s): AMMONIA in the last 168 hours. Coagulation Profile: No results for input(s): INR, PROTIME in the last 168 hours. Cardiac Enzymes: No results for input(s): CKTOTAL, CKMB, CKMBINDEX, TROPONINI in the last 168 hours. BNP (last 3 results) No results for input(s): PROBNP in the last 8760 hours. HbA1C: No results for input(s): HGBA1C in the last 72 hours. CBG: No results for input(s): GLUCAP in the last 168 hours. Lipid Profile: No results for input(s): CHOL, HDL, LDLCALC, TRIG, CHOLHDL, LDLDIRECT in the last 72 hours. Thyroid Function Tests: No results for input(s): TSH, T4TOTAL, FREET4, T3FREE, THYROIDAB in the last 72 hours. Anemia Panel: No results for input(s): VITAMINB12, FOLATE, FERRITIN, TIBC, IRON, RETICCTPCT in the last 72 hours. Sepsis Labs:  Recent Labs Lab 06/23/15 1507  LATICACIDVEN 0.78    No results found for this or any previous visit (from the past 240 hour(s)).       Radiology Studies: US Renal Port  06/24/2015  CLINICAL DATA:  Acute kidney injury. EXAM: RENAL / URINARY TRACT ULTRASOUND COMPLETE COMPARISON:  CT scan of Jun 23, 2015. FINDINGS: Right Kidney: Length: 12.1 cm. Mildly increased echogenicity of renal parenchyma is noted. No mass or hydronephrosis visualized. Left Kidney: Length: 11.9 cm. Mildly increased echogenicity of renal parenchyma is noted. 1.6 cm simple exophytic cyst is seen arising from midpole. No mass or hydronephrosis visualized. Bladder: Appears normal for degree of bladder distention. IMPRESSION: Mildly increased echogenicity of renal parenchyma is noted bilaterally suggesting medical renal disease. No hydronephrosis or renal obstruction is noted. Electronically Signed   By: Marijo Conception, M.D.   On: 06/24/2015 09:50   Ct Renal Stone Study  06/23/2015  CLINICAL DATA:  History multiple myeloma currently on  chemotherapy. Chronic kidney disease. Nausea, vomiting and diarrhea 4 days. Generalized abdominal pain with chills and weakness as well as dysuria and dark urine. Mid back pain. EXAM: CT ABDOMEN AND PELVIS WITHOUT CONTRAST TECHNIQUE: Multidetector CT imaging of the abdomen and pelvis was performed following the standard protocol without IV contrast. COMPARISON:  12/28/2014 and 02/03/2014 FINDINGS: Lung bases demonstrate very small bilateral pleural effusions with associated dependent atelectasis. Minimal cardiomegaly. The Abdominal images demonstrate the liver, spleen pancreas, gallbladder and adrenal glands to be within normal. Vascular structures are within normal. Kidneys are normal in size without hydronephrosis or nephrolithiasis. 1.3 cm exophytic cyst over the mid pole of the left kidney. Ureters are normal. Stomach is normal. Appendix is normal. Small bowel is within normal. There is mild diverticulosis of the colon. Pelvic images demonstrate the bladder, prostate and rectum to be within normal. Stable 1 cm sclerotic focus over the left sacrum likely a bone island. Old right lower anterior rib fracture. Focal lucency along the left side of the T9 vertebral body  with subtle patchy low density T7 and T8 not well seen previously and may represent evidence of patient's multiple myeloma. IMPRESSION: No acute findings in the abdomen/ pelvis. Subtle lucencies over the T7, T8 and T9 vertebral bodies not well seen previously and likely due to patient's known multiple myeloma. No compression fracture. Small bilateral pleural effusions with associated basilar atelectasis. Mild diverticulosis of the colon. 1.3 cm left renal cyst. Electronically Signed   By: Marin Olp M.D.   On: 06/23/2015 17:33        Scheduled Meds: . sodium chloride  250 mL Intravenous Once  . acyclovir  400 mg Oral Daily  . cefTRIAXone (ROCEPHIN) IVPB 1 gram/50 mL D5W  1 g Intravenous Q24H  . cholestyramine  4 g Oral BID  . enoxaparin  (LOVENOX) injection  30 mg Subcutaneous Q24H  . feeding supplement (ENSURE ENLIVE)  237 mL Oral TID BM  . HYDROcodone-acetaminophen  1 tablet Oral QID  . mirtazapine  15 mg Oral QHS  . prochlorperazine  10 mg Intravenous TID   Continuous Infusions: . sodium chloride 100 mL/hr at 06/23/15 1901     LOS: 1 day    Time spent: 25 minutes.     Elmarie Shiley, MD [facility] Pager [phone]  If 7PM-7AM, please contact night-coverage [email or web address] Password [facility] 06/24/2015, 2:49 PM

## 2015-06-24 NOTE — Assessment & Plan Note (Signed)
Patient states he's been experiencing nausea with vomiting and dehydration.  For the past 4 days.  He has had minimal to no oral intake.  Patient will be transported to the emergency department for further evaluation and management.

## 2015-06-24 NOTE — Progress Notes (Signed)
Initial Nutrition Assessment  DOCUMENTATION CODES:   Not applicable  INTERVENTION:  Ensure Enlive TID. Each supplement provides 350 kcals and 20 grams of protein.  NUTRITION DIAGNOSIS:   Increased nutrient needs related to cancer and cancer related treatments as evidenced by estimated needs.  GOAL:   Patient will meet greater than or equal to 90% of their needs  MONITOR:   PO intake, Supplement acceptance, Labs, Weight trends, Skin, I & O's  REASON FOR ASSESSMENT:   Malnutrition Screening Tool    ASSESSMENT:   Pt with medical history significant of Multiple Myeloma, CKD 3, back pain, currently undergoing chemotherapy, reports he was started on a new chemotherapeutic agent recently, last chemo was last week, went to the cancer center with Nausea, Vomiting, diarrhea, fever and chills x4days and was sent to the ER.  Pt does not speak english. Spoke to visitor at bedside who translated for me. Pt eats once per day. Pt has not been eating well for the past 4 days d/t N/V. Pt has had a poor appetite for at least 6 months.   Pt started chemo 1.5 years ago. Pt has had altered taste and poor appetite since then. Pt reports most foods tasting bland. Pt was asked if he would prefer Ensure or Boost Breeze and he requested Ensure. Intern discussed pouring it over ice since cold foods can be more tolerable with poor appetite and altered taste.  Pt does not drink nutrition supplements at home but is amenable to trying Ensure. Will order TID.   Pt reports losing 8 lbs since December and a UBW of 154 lbs. Per chart, pt has experienced a 4.5% weight loss since 03/05/15. This is not significant for time frame. NFPE: no muscle or fat depletion, no edema.   Labs revealed; BUN 60, creat 3.85, Ca 11.4, GFR 18. Meds reviewed.   Diet Order:  Diet regular Room service appropriate?: Yes; Fluid consistency:: Thin  Skin:  Reviewed, no issues  Last BM:  5/23  Height:   Ht Readings from Last 1  Encounters:  06/23/15 5' 2"  (1.575 m)    Weight:   Wt Readings from Last 1 Encounters:  06/23/15 148 lb (67.132 kg)    Ideal Body Weight:     BMI:  Body mass index is 27.06 kg/(m^2).  Estimated Nutritional Needs:   Kcal:  2000-2200  Protein:  100-110  Fluid:  2 L  EDUCATION NEEDS:   No education needs identified at this time  Geoffery Lyons, [place] Dietetic Intern Pager [phone]

## 2015-06-24 NOTE — Progress Notes (Signed)
SYMPTOM MANAGEMENT CLINIC    Chief Complaint: Nausea, vomiting, diarrhea, and dehydration.  HPI:  Charles [name] 40 y.o. male diagnosed with multiple myeloma.  Currently undergoing Kyprolis chemotherapy.   Patient presented to the [facility] today to receive his second cycle of his chemotherapy.  However, patient reports four-day history of nausea, vomiting, diarrhea, and significant dehydration.   Patient has a history of some chronic renal failure; with baseline creatinine around 2.5.  However, patient presented to the [facility] today with complaint of nausea, vomiting, diarrhea, and significant dehydration.  For the last 4 days.  He states he has had minimal to no oral intake; he feels very weak and fatigued.  He also reports increased mid to lower back pain as well.  Patient has a history of bone metastasis;  and takes tramadol as directed.  Labs obtained today did reveal a creatinine had increased up to 4.3.  Increased creatinine.  It could be secondary to severe dehydration.  Exam today reveals patient to help, weak, and uncomfortable with complaint of mid to lower back pain.  Vitals reveal elevated blood pressure.  Patient was afebrile.  Patient will be transported to the emergency department for further evaluation and management.  Brief history.  Report were called to the emergency department charge nurse prior to the patient via an transported to the emergency department via wheelchair.  Per the cancer Center nurse.   No history exists.    Review of Systems  Constitutional: Positive for weight loss and malaise/fatigue.  Gastrointestinal: Positive for nausea, vomiting and diarrhea.  Neurological: Positive for weakness.  All other systems reviewed and are negative.   Past Medical History  Diagnosis Date  . 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 CKD (chronic kidney disease), stage III   . Hypercholesterolemia   . Lytic bone lesions on xray     History reviewed. No  pertinent past surgical history.  has Lytic bone lesions on xray; Multiple myeloma (HCC); CKD (chronic kidney disease) stage 3, GFR 30-59 ml/min; Hordeolum externum of right eye; Prostatitis; Hypoalbuminemia due to protein-calorie malnutrition ([place]); Dehydration; Rash; Cancer associated pain; Nausea with vomiting; ARF (acute renal failure) ([place]); AKI (acute kidney injury) ([place]); Pain from bone metastases ([place]); Acute on chronic renal failure ([place]); Diarrhea; and Hypercalcemia on his problem list.    is allergic to ciprofloxacin and mobic.    Medication List       This list is accurate as of: 06/23/15  1:48 PM.  Always use your most recent med list.               acetaminophen 500 MG tablet  Commonly known as:  TYLENOL  Take 500 mg by mouth every 6 (six) hours as needed for mild pain, moderate pain, fever or headache.     acyclovir 200 MG capsule  Commonly known as:  ZOVIRAX  Take 2 capsules (400 mg total) by mouth daily.     dexamethasone 4 MG tablet  Commonly known as:  DECADRON  Take 5 tablets (47m) twice a week, not on treatment day (tome 5 pastillas dos veces a la semana, no en el dia de tratamiento)     prochlorperazine 10 MG tablet  Commonly known as:  COMPAZINE  Take 1 tablet (10 mg total) by mouth every 6 (six) hours as needed for nausea or vomiting.     traMADol 50 MG tablet  Commonly known as:  ULTRAM  Take 1 tablet (50 mg total) by mouth every 6 (six)  hours as needed.     VITAMIN D PO  Take 500 Units by mouth daily.         PHYSICAL EXAMINATION  Oncology Vitals 06/24/2015 06/23/2015  Height - -  Weight - -  Weight (lbs) - -  BMI (kg/m2) - -  Temp 98.2 97.5  Pulse 86 88  Resp 16 20  SpO2 98 97  BSA (m2) - -   BP Readings from Last 2 Encounters:  06/24/15 164/89  06/23/15 178/102    Physical Exam  Constitutional: He is oriented to person, place, and time. He appears to be writhing in pain, malnourished and dehydrated. He appears unhealthy. He  appears cachectic. He has a sickly appearance.  HENT:  Head: Normocephalic and atraumatic.  Eyes: Conjunctivae and EOM are normal. Pupils are equal, round, and reactive to light. Right eye exhibits no discharge. Left eye exhibits no discharge. No scleral icterus.  Neck: Normal range of motion. Neck supple. No JVD present. No tracheal deviation present. No thyromegaly present.  Cardiovascular: Normal rate, regular rhythm, normal heart sounds and intact distal pulses.   Pulmonary/Chest: Effort normal and breath sounds normal. No respiratory distress. He has no wheezes. He has no rales. He exhibits no tenderness.  Abdominal: Soft. Bowel sounds are normal. He exhibits no distension and no mass. There is no tenderness. There is no rebound and no guarding.  Musculoskeletal: Normal range of motion. He exhibits tenderness. He exhibits no edema.  Tenderness to the mid and lower back with palpation and movement.  Lymphadenopathy:    He has no cervical adenopathy.  Neurological: He is alert and oriented to person, place, and time.  Skin: Skin is warm and dry. No rash noted. No erythema. There is pallor.  Psychiatric: Affect normal.  Nursing note and vitals reviewed.   LABORATORY DATA:. Admission on 06/23/2015  Component Date Value Ref Range Status  . Lactic Acid, Venous 06/23/2015 0.78  0.5 - 2.0 mmol/L Final  . Sodium 06/23/2015 133* 135 - 145 mmol/L Final  . Potassium 06/23/2015 4.0  3.5 - 5.1 mmol/L Final  . Chloride 06/23/2015 101  101 - 111 mmol/L Final  . CO2 06/23/2015 21* 22 - 32 mmol/L Final  . Glucose, Bld 06/23/2015 115* 65 - 99 mg/dL Final  . BUN 06/23/2015 66* 6 - 20 mg/dL Final  . Creatinine, Ser 06/23/2015 4.25* 0.61 - 1.24 mg/dL Final  . Calcium 06/23/2015 12.2* 8.9 - 10.3 mg/dL Final  . Total Protein 06/23/2015 7.9  6.5 - 8.1 g/dL Final  . Albumin 06/23/2015 1.9* 3.5 - 5.0 g/dL Final  . AST 06/23/2015 12* 15 - 41 U/L Final  . ALT 06/23/2015 23  17 - 63 U/L Final  . Alkaline  Phosphatase 06/23/2015 152* 38 - 126 U/L Final  . Total Bilirubin 06/23/2015 0.4  0.3 - 1.2 mg/dL Final  . GFR calc non Af Amer 06/23/2015 16* >60 mL/min Final  . GFR calc Af Amer 06/23/2015 19* >60 mL/min Final   Comment: (NOTE) The eGFR has been calculated using the CKD EPI equation. This calculation has not been validated in all clinical situations. eGFR's persistently <60 mL/min signify possible Chronic Kidney Disease.   . Anion gap 06/23/2015 11  5 - 15 Final  . WBC 06/23/2015 21.2* 4.0 - 10.5 K/uL Final  . RBC 06/23/2015 2.95* 4.22 - 5.81 MIL/uL Final  . Hemoglobin 06/23/2015 9.1* 13.0 - 17.0 g/dL Final  . HCT 06/23/2015 27.6* 39.0 - 52.0 % Final  . MCV 06/23/2015 93.6  78.0 - 100.0 fL Final  . MCH 06/23/2015 30.8  26.0 - 34.0 pg Final  . MCHC 06/23/2015 33.0  30.0 - 36.0 g/dL Final  . RDW 06/23/2015 14.1  11.5 - 15.5 % Final  . Platelets 06/23/2015 852* 150 - 400 K/uL Final  . Neutrophils Relative % 06/23/2015 78   Final  . Lymphocytes Relative 06/23/2015 14   Final  . Monocytes Relative 06/23/2015 8   Final  . Eosinophils Relative 06/23/2015 0   Final  . Basophils Relative 06/23/2015 0   Final  . Neutro Abs 06/23/2015 16.5* 1.7 - 7.7 K/uL Final  . Lymphs Abs 06/23/2015 3.0  0.7 - 4.0 K/uL Final  . Monocytes Absolute 06/23/2015 1.7* 0.1 - 1.0 K/uL Final  . Eosinophils Absolute 06/23/2015 0.0  0.0 - 0.7 K/uL Final  . Basophils Absolute 06/23/2015 0.0  0.0 - 0.1 K/uL Final  . Smear Review 06/23/2015 LARGE PLATELETS PRESENT   Final  . Color, Urine 06/23/2015 RED* YELLOW Final   BIOCHEMICALS MAY BE AFFECTED BY COLOR  . APPearance 06/23/2015 CLOUDY* CLEAR Final  . Specific Gravity, Urine 06/23/2015 1.013  1.005 - 1.030 Final  . pH 06/23/2015 5.5  5.0 - 8.0 Final  . Glucose, UA 06/23/2015 NEGATIVE  NEGATIVE mg/dL Final  . Hgb urine dipstick 06/23/2015 LARGE* NEGATIVE Final  . Bilirubin Urine 06/23/2015 NEGATIVE  NEGATIVE Final  . Ketones, ur 06/23/2015 NEGATIVE  NEGATIVE mg/dL  Final  . Protein, ur 06/23/2015 >300* NEGATIVE mg/dL Final  . Nitrite 06/23/2015 NEGATIVE  NEGATIVE Final  . Leukocytes, UA 06/23/2015 SMALL* NEGATIVE Final  . Squamous Epithelial / LPF 06/23/2015 0-5* NONE SEEN Final  . WBC, UA 06/23/2015 6-30  0 - 5 WBC/hpf Final  . RBC / HPF 06/23/2015 TOO NUMEROUS TO COUNT  0 - 5 RBC/hpf Final  . Bacteria, UA 06/23/2015 FEW* NONE SEEN Final  . Casts 06/23/2015 GRANULAR CAST* NEGATIVE Final  . WBC 06/24/2015 15.0* 4.0 - 10.5 K/uL Final  . RBC 06/24/2015 2.59* 4.22 - 5.81 MIL/uL Final  . Hemoglobin 06/24/2015 8.0* 13.0 - 17.0 g/dL Final  . HCT 06/24/2015 24.6* 39.0 - 52.0 % Final  . MCV 06/24/2015 95.0  78.0 - 100.0 fL Final  . MCH 06/24/2015 30.9  26.0 - 34.0 pg Final  . MCHC 06/24/2015 32.5  30.0 - 36.0 g/dL Final  . RDW 06/24/2015 14.2  11.5 - 15.5 % Final  . Platelets 06/24/2015 762* 150 - 400 K/uL Final  . Sodium 06/24/2015 137  135 - 145 mmol/L Final  . Potassium 06/24/2015 4.0  3.5 - 5.1 mmol/L Final  . Chloride 06/24/2015 108  101 - 111 mmol/L Final  . CO2 06/24/2015 20* 22 - 32 mmol/L Final  . Glucose, Bld 06/24/2015 95  65 - 99 mg/dL Final  . BUN 06/24/2015 60* 6 - 20 mg/dL Final  . Creatinine, Ser 06/24/2015 3.85* 0.61 - 1.24 mg/dL Final  . Calcium 06/24/2015 11.4* 8.9 - 10.3 mg/dL Final  . Total Protein 06/24/2015 6.7  6.5 - 8.1 g/dL Final  . Albumin 06/24/2015 1.6* 3.5 - 5.0 g/dL Final  . AST 06/24/2015 10* 15 - 41 U/L Final  . ALT 06/24/2015 18  17 - 63 U/L Final  . Alkaline Phosphatase 06/24/2015 118  38 - 126 U/L Final  . Total Bilirubin 06/24/2015 0.3  0.3 - 1.2 mg/dL Final  . GFR calc non Af Amer 06/24/2015 18* >60 mL/min Final  . GFR calc Af Amer 06/24/2015 21* >60 mL/min Final  Comment: (NOTE) The eGFR has been calculated using the CKD EPI equation. This calculation has not been validated in all clinical situations. eGFR's persistently <60 mL/min signify possible Chronic Kidney Disease.   . Anion gap 06/24/2015 9  5 - 15  Final  Appointment on 06/23/2015  Component Date Value Ref Range Status  . WBC 06/23/2015 19.8* 4.0 - 10.3 10e3/uL Final  . NEUT# 06/23/2015 16.8* 1.5 - 6.5 10e3/uL Final  . HGB 06/23/2015 9.6* 13.0 - 17.1 g/dL Final  . HCT 06/23/2015 29.0* 38.4 - 49.9 % Final  . Platelets 06/23/2015 785* 140 - 400 10e3/uL Final  . MCV 06/23/2015 93.3  79.3 - 98.0 fL Final  . MCH 06/23/2015 30.8  27.2 - 33.4 pg Final  . MCHC 06/23/2015 33.1  32.0 - 36.0 g/dL Final  . RBC 06/23/2015 3.11* 4.20 - 5.82 10e6/uL Final  . RDW 06/23/2015 14.5  11.0 - 14.6 % Final  . lymph# 06/23/2015 1.7  0.9 - 3.3 10e3/uL Final  . MONO# 06/23/2015 1.3* 0.1 - 0.9 10e3/uL Final  . Eosinophils Absolute 06/23/2015 0.0  0.0 - 0.5 10e3/uL Final  . Basophils Absolute 06/23/2015 0.0  0.0 - 0.1 10e3/uL Final  . NEUT% 06/23/2015 84.9* 39.0 - 75.0 % Final  . LYMPH% 06/23/2015 8.5* 14.0 - 49.0 % Final  . MONO% 06/23/2015 6.4  0.0 - 14.0 % Final  . EOS% 06/23/2015 0.1  0.0 - 7.0 % Final  . BASO% 06/23/2015 0.1  0.0 - 2.0 % Final  . Sodium 06/23/2015 134* 136 - 145 mEq/L Final  . Potassium 06/23/2015 4.2  3.5 - 5.1 mEq/L Final  . Chloride 06/23/2015 104  98 - 109 mEq/L Final  . CO2 06/23/2015 20* 22 - 29 mEq/L Final  . Glucose 06/23/2015 139  70 - 140 mg/dl Final   Glucose reference range is for nonfasting patients. Fasting glucose reference range is 70- 100.  Marland Kitchen BUN 06/23/2015 63.9* 7.0 - 26.0 mg/dL Final  . Creatinine 06/23/2015 4.3 Repeated and Verified* 0.7 - 1.3 mg/dL Final  . Calcium 06/23/2015 13.4 Repeated and Verified* 8.4 - 10.4 mg/dL Final  . Anion Gap 06/23/2015 11  3 - 11 mEq/L Final  . EGFR 06/23/2015 16* >90 ml/min/1.73 m2 Final   eGFR is calculated using the CKD-EPI Creatinine Equation (2009)    RADIOGRAPHIC STUDIES: US Renal Port  06/24/2015  CLINICAL DATA:  Acute kidney injury. EXAM: RENAL / URINARY TRACT ULTRASOUND COMPLETE COMPARISON:  CT scan of Jun 23, 2015. FINDINGS: Right Kidney: Length: 12.1 cm. Mildly  increased echogenicity of renal parenchyma is noted. No mass or hydronephrosis visualized. Left Kidney: Length: 11.9 cm. Mildly increased echogenicity of renal parenchyma is noted. 1.6 cm simple exophytic cyst is seen arising from midpole. No mass or hydronephrosis visualized. Bladder: Appears normal for degree of bladder distention. IMPRESSION: Mildly increased echogenicity of renal parenchyma is noted bilaterally suggesting medical renal disease. No hydronephrosis or renal obstruction is noted. Electronically Signed   By: Marijo Conception, M.D.   On: 06/24/2015 09:50   Ct Renal Stone Study  06/23/2015  CLINICAL DATA:  History multiple myeloma currently on chemotherapy. Chronic kidney disease. Nausea, vomiting and diarrhea 4 days. Generalized abdominal pain with chills and weakness as well as dysuria and dark urine. Mid back pain. EXAM: CT ABDOMEN AND PELVIS WITHOUT CONTRAST TECHNIQUE: Multidetector CT imaging of the abdomen and pelvis was performed following the standard protocol without IV contrast. COMPARISON:  12/28/2014 and 02/03/2014 FINDINGS: Lung bases demonstrate very small bilateral pleural  effusions with associated dependent atelectasis. Minimal cardiomegaly. The Abdominal images demonstrate the liver, spleen pancreas, gallbladder and adrenal glands to be within normal. Vascular structures are within normal. Kidneys are normal in size without hydronephrosis or nephrolithiasis. 1.3 cm exophytic cyst over the mid pole of the left kidney. Ureters are normal. Stomach is normal. Appendix is normal. Small bowel is within normal. There is mild diverticulosis of the colon. Pelvic images demonstrate the bladder, prostate and rectum to be within normal. Stable 1 cm sclerotic focus over the left sacrum likely a bone island. Old right lower anterior rib fracture. Focal lucency along the left side of the T9 vertebral body with subtle patchy low density T7 and T8 not well seen previously and may represent evidence  of patient's multiple myeloma. IMPRESSION: No acute findings in the abdomen/ pelvis. Subtle lucencies over the T7, T8 and T9 vertebral bodies not well seen previously and likely due to patient's known multiple myeloma. No compression fracture. Small bilateral pleural effusions with associated basilar atelectasis. Mild diverticulosis of the colon. 1.3 cm left renal cyst. Electronically Signed   By: Marin Olp M.D.   On: 06/23/2015 17:33    ASSESSMENT/PLAN:    Multiple myeloma ([place]) Pt presented to the Cancer center today to receive cycle 2 of his Kyprolis therapy.  However, patient reports a four-day history of nausea, vomiting, diarrhea, and significant dehydration.  Decision was made to hold the Kyprolis today.  See further notes for details.  Blood counts obtained today reveal a leukocytosis with a white count of 19.8, ANC 16.8, hemoglobin 9.6, and platelet count 785.  Patient is scheduled to return on 06/24/2015; but this appointment will be canceled.  Patient will next be scheduled for labs and his next infusion on 07/01/2015.  Note: Patient should be considered a full code; since there are no advanced directives on his chart.  Dehydration Patient states that he has been experiencing nausea, vomiting, diarrhea and subsequent dehydration for the past 4 days.  He states he has had minimal to no oral intake; due to the symptoms.  Patient appears fatigued, frail, and pale.  See further notes for details of today's visit.  Patient will be transported to the emergency department with diagnosis of dehydration and acute on chronic renal failure.    Cancer associated pain Patient has known bone metastasis; and has been taking tramadol as directed.  He states that his pain to his mid to lower back has now significantly increased and he is rating his pain a 10 out of 10.  Due to patient's significant pain; plus his other new presenting symptoms-patient will be transported to the emergency  department for further evaluation and management.  Nausea with vomiting Patient states he's been experiencing nausea with vomiting and dehydration.  For the past 4 days.  He has had minimal to no oral intake.  Patient will be transported to the emergency department for further evaluation and management.  Acute on chronic renal failure (HCC) Patient has a history of some chronic renal failure; with baseline creatinine around 2.5.  However, patient presented to the [facility] today with complaint of nausea, vomiting, diarrhea, and significant dehydration.  For the last 4 days.  He states he has had minimal to no oral intake; he feels very weak and fatigued.  He also reports increased mid to lower back pain as well.  Patient has a history of bone metastasis;  and takes tramadol as directed.  Labs obtained today did reveal a creatinine had  increased up to 4.3.  Increased creatinine.  It could be secondary to severe dehydration.  Exam today reveals patient to help, weak, and uncomfortable with complaint of mid to lower back pain.  Vitals reveal elevated blood pressure.  Patient was afebrile.  Patient will be transported to the emergency department for further evaluation and management.  Brief history.  Report were called to the emergency department charge nurse prior to the patient via an transported to the emergency department via wheelchair.  Per the cancer Center nurse.      Diarrhea Patient states that he has been having diarrhea and subsequent dehydration for the past 4 days.  He has been taking Imodium at home with minimal effectiveness.  Patient appears significantly dehydrated today; and will be transported to the emergency department for further evaluation and management.  Hypercalcemia Calcium has increased to 13.4.  Was unable to obtain the corrected calcium level; since it was no albumin drawn with labs today.  Hypercalcemia could be secondary to disease progression; or  secondary to acute/increased dehydration.  Patient will be transported to the emergency department for further evaluation and management today.   Patient stated understanding of all instructions; and was in agreement with this plan of care. The patient knows to call the clinic with any problems, questions or concerns.   Total time spent with patient was 25 minutes;  with greater than 75 percent of that time spent in face to face counseling regarding patient's symptoms,  and coordination of care and follow up.  Disclaimer:This dictation was prepared with Dragon/digital dictation along with [employer]. Any transcriptional errors that result from this process are unintentional.  Drue Second, NP 06/24/2015

## 2015-06-24 NOTE — Assessment & Plan Note (Addendum)
Pt presented to the Cancer center today to receive cycle 2 of his Kyprolis therapy.  However, patient reports a four-day history of nausea, vomiting, diarrhea, and significant dehydration.  Decision was made to hold the Kyprolis today.  See further notes for details.  Blood counts obtained today reveal a leukocytosis with a white count of 19.8, ANC 16.8, hemoglobin 9.6, and platelet count 785.  Patient is scheduled to return on 06/24/2015; but this appointment will be canceled.  Patient will next be scheduled for labs and his next infusion on 07/01/2015.  Note: Patient should be considered a full code; since there are no advanced directives on his chart.

## 2015-06-24 NOTE — Assessment & Plan Note (Signed)
Patient has a history of some chronic renal failure; with baseline creatinine around 2.5.  However, patient presented to the [facility] today with complaint of nausea, vomiting, diarrhea, and significant dehydration.  For the last 4 days.  He states he has had minimal to no oral intake; he feels very weak and fatigued.  He also reports increased mid to lower back pain as well.  Patient has a history of bone metastasis;  and takes tramadol as directed.  Labs obtained today did reveal a creatinine had increased up to 4.3.  Increased creatinine.  It could be secondary to severe dehydration.  Exam today reveals patient to help, weak, and uncomfortable with complaint of mid to lower back pain.  Vitals reveal elevated blood pressure.  Patient was afebrile.  Patient will be transported to the emergency department for further evaluation and management.  Brief history.  Report were called to the emergency department charge nurse prior to the patient via an transported to the emergency department via wheelchair.  Per the cancer Center nurse.

## 2015-06-24 NOTE — Assessment & Plan Note (Signed)
Patient has known bone metastasis; and has been taking tramadol as directed.  He states that his pain to his mid to lower back has now significantly increased and he is rating his pain a 10 out of 10.  Due to patient's significant pain; plus his other new presenting symptoms-patient will be transported to the emergency department for further evaluation and management.

## 2015-06-24 NOTE — Assessment & Plan Note (Signed)
Calcium has increased to 13.4.  Was unable to obtain the corrected calcium level; since it was no albumin drawn with labs today.  Hypercalcemia could be secondary to disease progression; or secondary to acute/increased dehydration.  Patient will be transported to the emergency department for further evaluation and management today.

## 2015-06-24 NOTE — Progress Notes (Signed)
Patient Information    Patient Name Sex DOB SSN   Charles [name], Charles [name] Male [birth year] [id number]    Consult Note by [name], MD at 06/24/2015 7:49 AM    Author: [name], MD Service: Hematology Author Type: Physician   Filed: 06/24/2015 8:18 AM Note Time: 06/24/2015 7:49 AM Status: Incomplete   Editor: [name], MD (Physician)     [place]  [phone]) [phone] Fax:([phone]) [phone]     ID: Charles [name] DOB: Nov 17, 1975 MR#: [id number] WHQ#:[id number]  Patient Care Team: Tresa Garter, MD as PCP - General (Internal Medicine) [name], NP as Nurse Practitioner (Hematology and Oncology) [name], MD as Consulting Physician (Oncology) PCP: [name], MD OTHER MD:  CHIEF COMPLAINT: myeloma; hyercalcemia; renal injury  CURRENT TREATMENT: carfilzomib, dexamethasone  INTERVAL HISTORY: Khoen progressed through maintenance therapy with lenalidomide and was scheduled to receive cytoxan/carfilzomib/dexamethasone starting mid April 2017. However he tolerated the cytoxan poorly, the initial carfilzomib was not started until 05/28/2015 and he received only part of that first cycle. He now presented with fever, uncontrolled pain, nausea, vomiting, diarrhea, dehydration, hypercalcemia and worsening renal function.    Since admission yesterday he has been on IVF with some improvement in renal function and calcium.  REVIEW OF SYSTEMS: Conor's pain is poorly controlled on his current regimen. He still has some darrhea-- 'everything i eat comes right out." he is on clear liquids. He remains nauseated. He had a mild headache today. His pain localizes to the mid back. Denies cough, phlegm or pleurisy. Urine is "dark." Wife in room  PAST MEDICAL HISTORY: Past Medical History  Diagnosis Date  . IgG myeloma ([place])   . CKD (chronic kidney disease), stage III   .  Hypercholesterolemia   . Lytic bone lesions on xray     PAST SURGICAL HISTORY: History reviewed. No pertinent past surgical history.  FAMILY HISTORY Family History  Problem Relation Age of Onset  . Diabetes Father     SOCIAL HISTORY:  Originally from [facility], moved here 19 years ago. He works in Nurse, adult. Married (wife's name is [name]), lives in [place]. He has 5 children ages 10, 36, 66, 6 and 5 months [name], [name], [name] and Bernard) in good health. Best number to call him is [phone]  ADVANCED DIRECTIVES: not in place  HEALTH MAINTENANCE: Social History  Substance Use Topics  . Smoking status: Never Smoker   . Smokeless tobacco: Never Used  . Alcohol Use: No     Allergies  Allergen Reactions  . Ciprofloxacin Rash  . Mobic [Meloxicam] Rash    Current Facility-Administered Medications  Medication Dose Route Frequency Provider Last Rate Last Dose  . 0.9 % sodium chloride infusion  Intravenous Continuous Domenic Polite, MD 100 mL/hr at 06/23/15 1901   . 0.9 % sodium chloride infusion 250 mL Intravenous Once [name], MD  Stopped at 06/23/15 2056  . acetaminophen (TYLENOL) tablet 500 mg 500 mg Oral Q6H PRN Domenic Polite, MD    . acyclovir (ZOVIRAX) 200 MG capsule 400 mg 400 mg Oral Daily Domenic Polite, MD    . cefTRIAXone (ROCEPHIN) 1 g in dextrose 5 % 50 mL IVPB 1 g Intravenous Q24H Domenic Polite, MD    . enoxaparin (LOVENOX) injection 30 mg 30 mg Subcutaneous Q24H Domenic Polite, MD  30 mg at 06/23/15 2107  . heparin lock flush 100 unit/mL 500 Units Intracatheter Daily PRN Sarajane Jews  C Damonte Frieson, MD    . heparin lock flush 100 unit/mL 250 Units Intracatheter PRN [name], MD    . morphine 2 MG/ML injection 2 mg 2 mg Intravenous Q3H PRN  Domenic Polite, MD    . ondansetron [facility]) injection 4 mg 4 mg Intravenous Q6H PRN Domenic Polite, MD    . ondansetron [facility]) tablet 4 mg 4 mg Oral Q6H PRN Domenic Polite, MD     Or  . ondansetron [facility]) injection 4 mg 4 mg Intravenous Q6H PRN Domenic Polite, MD    . sodium chloride flush (NS) 0.9 % injection 10 mL 10 mL Intracatheter PRN [name], MD    . sodium chloride flush (NS) 0.9 % injection 3 mL 3 mL Intracatheter PRN [name], MD    . traMADol [facility]) tablet 50 mg 50 mg Oral Q6H PRN Domenic Polite, MD      OBJECTIVE: middle agead Spanish speaker examined at bedside Filed Vitals:   06/23/15 2230 06/24/15 0525  BP: 177/94 164/89  Pulse: 88 86  Temp: 97.5 F (36.4 C) 98.2 F (36.8 C)  Resp: 20 16   Body mass index is 27.06 kg/(m^2).  Ocular: Sclerae unicteric, EOMs intact Lymphatic: No cervical or supraclavicular adenopathy Lungs no rales or rhonchi Heart regular rate and rhythm, no murmur appreciated Abd soft, nontender, positive bowel sounds MSK mild tenderness midback Neuro: non-focal, well-oriented, depressed affect   LAB RESULTS:  CMP  Labs (Brief)       Component Value Date/Time   NA 137 06/24/2015 0338   NA 134* 06/23/2015 1215   K 4.0 06/24/2015 0338   K 4.2 06/23/2015 1215   CL 108 06/24/2015 0338   CO2 20* 06/24/2015 0338   CO2 20* 06/23/2015 1215   GLUCOSE 95 06/24/2015 0338   GLUCOSE 139 06/23/2015 1215   BUN 60* 06/24/2015 0338   BUN 63.9* 06/23/2015 1215   CREATININE 3.85* 06/24/2015 0338   CREATININE 4.3 Repeated and Verified* 06/23/2015 1215   CALCIUM 11.4* 06/24/2015 0338   CALCIUM 13.4 Repeated and Verified* 06/23/2015 1215   PROT 6.7 06/24/2015 0338   PROT 6.6 06/11/2015 1010   PROT 7.5 06/11/2015 1010   ALBUMIN 1.6* 06/24/2015 0338    ALBUMIN 1.7* 06/11/2015 1010   AST 10* 06/24/2015 0338   AST 27 06/11/2015 1010   ALT 18 06/24/2015 0338   ALT 25 06/11/2015 1010   ALKPHOS 118 06/24/2015 0338   ALKPHOS 150 06/11/2015 1010   BILITOT 0.3 06/24/2015 0338   BILITOT <0.30 06/11/2015 1010   GFRNONAA 18* 06/24/2015 0338   GFRAA 21* 06/24/2015 0338      I   Recent Labs    No results found for: SPEP, UPEP     Recent Labs    Lab Results  Component Value Date   WBC 15.0* 06/24/2015   NEUTROABS 16.5* 06/23/2015   HGB 8.0* 06/24/2015   HCT 24.6* 06/24/2015   MCV 95.0 06/24/2015   PLT 762* 06/24/2015      @LASTCHEMISTRY @   Recent Labs    No results found for: LABCA2     Recent Labs    No components found for: LABCA125     Last Labs     No results for input(s): INR in the last 168 hours.    Urinalysis  Labs (Brief)       Component Value Date/Time   COLORURINE RED* 06/23/2015 1430   APPEARANCEUR CLOUDY* 06/23/2015 1430   LABSPEC 1.013 06/23/2015 1430  LABSPEC 1.030 01/22/2015 1018   PHURINE 5.5 06/23/2015 1430   PHURINE 5.0 01/22/2015 1018   GLUCOSEU NEGATIVE 06/23/2015 1430   GLUCOSEU Negative 01/22/2015 1018   HGBUR LARGE* 06/23/2015 1430   HGBUR Large 01/22/2015 1018   BILIRUBINUR NEGATIVE 06/23/2015 1430   BILIRUBINUR Negative 01/22/2015 1018   KETONESUR NEGATIVE 06/23/2015 1430   KETONESUR Negative 01/22/2015 1018   PROTEINUR >300* 06/23/2015 1430   PROTEINUR 2000 01/22/2015 1018   UROBILINOGEN 0.2 01/22/2015 1018   UROBILINOGEN 0.2 01/31/2014 1050   NITRITE NEGATIVE 06/23/2015 1430   NITRITE Negative 01/22/2015 1018   LEUKOCYTESUR SMALL* 06/23/2015 1430   LEUKOCYTESUR Color Interference due to blood in urine 01/22/2015 1018        ELIGIBLE FOR AVAILABLE RESEARCH PROTOCOL: no  STUDIES:  Imaging Results    Ct Renal  Stone Study  06/23/2015 CLINICAL DATA: History multiple myeloma currently on chemotherapy. Chronic kidney disease. Nausea, vomiting and diarrhea 4 days. Generalized abdominal pain with chills and weakness as well as dysuria and dark urine. Mid back pain. EXAM: CT ABDOMEN AND PELVIS WITHOUT CONTRAST TECHNIQUE: Multidetector CT imaging of the abdomen and pelvis was performed following the standard protocol without IV contrast. COMPARISON: 12/28/2014 and 02/03/2014 FINDINGS: Lung bases demonstrate very small bilateral pleural effusions with associated dependent atelectasis. Minimal cardiomegaly. The Abdominal images demonstrate the liver, spleen pancreas, gallbladder and adrenal glands to be within normal. Vascular structures are within normal. Kidneys are normal in size without hydronephrosis or nephrolithiasis. 1.3 cm exophytic cyst over the mid pole of the left kidney. Ureters are normal. Stomach is normal. Appendix is normal. Small bowel is within normal. There is mild diverticulosis of the colon. Pelvic images demonstrate the bladder, prostate and rectum to be within normal. Stable 1 cm sclerotic focus over the left sacrum likely a bone island. Old right lower anterior rib fracture. Focal lucency along the left side of the T9 vertebral body with subtle patchy low density T7 and T8 not well seen previously and may represent evidence of patient's multiple myeloma. IMPRESSION: No acute findings in the abdomen/ pelvis. Subtle lucencies over the T7, T8 and T9 vertebral bodies not well seen previously and likely due to patient's known multiple myeloma. No compression fracture. Small bilateral pleural effusions with associated basilar atelectasis. Mild diverticulosis of the colon. 1.3 cm left renal cyst. Electronically Signed By: Marin Olp M.D. On: 06/23/2015 17:33     ASSESSMENT: 40 y.o. Spanish speaker presenting January 2016 with bony pain, multiple lytic lesions, and monoclonal IgG kappa paraprotein  in serum (2.22 g/dL) and urine (145 mg/dL), bone marrow biopsy 02/03/2014 showing an average 12% plasmacytosis, with some sheets of abnormal plasma cells noted, cytogenetics pending; baseline beta 2 microglobulin was 7.36, baseline creatinine 1.64, with GFR 51, calcium on general 05/21/2014 was 10.8, LDH was normal  (1) Multiple myeloma stage IIA diagnosed January 2016;   I: CyBorD started January 2016 (a) dexamethasone 20 mg/d every TU/WED started 02/04/2014 (b) bortezomib sQ days 1,4,8,11 of every 21 day cycle started 02/10/2014 (c) cyclophosphamide 300 mg/M2 IV weekly started 02/13/2014   2: Lenalidomide 29m daily started 07/26/2014, discontinued December 2016 with progression  (2) chronic kidney disease stage III  (3) hypercalcemia: zolendronate started 02/10/2014-- repeat every 4 weeks initially, then every 12 weeks (a) changed to denosumab/Xgeva starting 02/26/2015 because of CKD, repeated every 12 weeks  (4) ID prophylaxis: (a) influenza and Pneumovax [PV13] vaccines 02/07/2014 (b) acyclovir started 02/07/2014 (c) HIV negative 01/31/2014 (d) Pneumovax P23 too be given after March  2016  (5). The patient is not a transplant candidate for financial reasons  (6) bilateral foot rash: Started on Septra DS and Diflucan 09/03/2014, resolved  (7) Multiple Myeloma relapse in December 2016.  (a) starting 02/05/15: dexamethasone 31m x2 consecutive days weekly; bortezomib sQ weekly,  (b) Septra DS twice daily (c) acyclovir 4022mdaily  (d) bortezomib/ dexamethasone discontinued after 05/06/2015 dose with evidence of progression  (8) started carfilzomib 05/28/2015, given with weekly low dose dexamethasone (a) tolerated cyclophosphamide poorly, discontinued after cycle 1 (b) carfilzomib started 05/28/2015, first  cycle only 4 doses (c) taking dexamethasone 20 mg daily 2 days a week, not on treatment days)   PLAN: Itzae;s situation is difficult. He understands myeloma is not curable with our current knowledge base. The goal of treatment is control. Normally we would treat aggressively with a view to autologous transplant but he is not a candidate for financial reasons. Accordingly we have tried to do the minimum necessary to control his tumor so as to make maximum use of the therapies we do have available. Recently however his cancer has been progressing. At this point it is not clear whether his current symptoms are due to disease progression, poor tolerance of the carfilzomib, or an unrelated viral illness in this immunocompromised patient.  Active problems include pain, nausea/voming, diarrhea, hypercalcemia and worsening renal insufficiency. His calcium and creatinine are improving with hydration alone. I would suggest:  (1) pain: start hydrocodone/APAP QID and PRN; if constipation develops add bowel prophylaxis (2) diarrhea: add questran for now (3) nausea/ vomiting: compazine IV then po (4) hypercalcemia; continue IVF; add pamidronate; resume denosumab as outpatient; d/c vit D (5) myeloma: will decide whether to push on with current treatment, intensify or change; all his chemo is outpatient  I have written the appropriate orders. Once he is taking po well we can continue therapy as outpatient (including IVF). Would anticipate discharge in next 24-48 hours  Greatly appreciate your help to this patient!   MAChauncey CruelMD 06/24/2015 7:49 AM Medical Oncology and Hemat[facility] [place] [id number]. [phone]. [phone]

## 2015-06-24 NOTE — Assessment & Plan Note (Signed)
Patient states that he has been experiencing nausea, vomiting, diarrhea and subsequent dehydration for the past 4 days.  He states he has had minimal to no oral intake; due to the symptoms.  Patient appears fatigued, frail, and pale.  See further notes for details of today's visit.  Patient will be transported to the emergency department with diagnosis of dehydration and acute on chronic renal failure.

## 2015-06-24 NOTE — Assessment & Plan Note (Signed)
Patient states that he has been having diarrhea and subsequent dehydration for the past 4 days.  He has been taking Imodium at home with minimal effectiveness.  Patient appears significantly dehydrated today; and will be transported to the emergency department for further evaluation and management.

## 2015-06-24 NOTE — Consult Note (Signed)
[place]  [phone] [phone]   This is a preliminary note which was not completed. It is being closed 07/03/2015  ID: Charles [name] DOB: 1975-09-06  MR#: [id number]  ULA#:[id number]  Patient Care Team: Tresa Garter, MD as PCP - General (Internal Medicine) [name], NP as Nurse Practitioner (Hematology and Oncology) [name], MD as Consulting Physician (Oncology) PCP: [name], MD OTHER MD:  CHIEF COMPLAINT: myeloma; hyercalcemia; renal injury  CURRENT TREATMENT: carfilzomib, dexamethasone  INTERVAL HISTORY: Charles [name] progressed through maintenance therapy with lenalidomide and was scheduled to receive cytoxan/carfilzomib/dexamethasone starting mid April 2017. However he tolerated the cytoxan poorly. His first cycle of chemotherapy was accordingly truncated  REVIEW OF SYSTEMS: n/a  PAST MEDICAL HISTORY: Past Medical History  Diagnosis Date  . IgG myeloma ([name])   . CKD (chronic kidney disease), stage III   . Hypercholesterolemia   . Lytic bone lesions on xray     PAST SURGICAL HISTORY: History reviewed. No pertinent past surgical history.  FAMILY HISTORY Family History  Problem Relation Age of Onset  . Diabetes Father     GYNECOLOGIC HISTORY:  No LMP for male patient.  SOCIAL HISTORY:      ADVANCED DIRECTIVES:    HEALTH MAINTENANCE: Social History  Substance Use Topics  . Smoking status: Never Smoker   . Smokeless tobacco: Never Used  . Alcohol Use: No     Colonoscopy:  PAP:  Bone density:  Lipid panel:  Allergies  Allergen Reactions  . Ciprofloxacin Rash  . Mobic [Meloxicam] Rash    Current Facility-Administered Medications  Medication Dose Route Frequency Provider Last Rate Last Dose  . 0.9 %  sodium chloride infusion   Intravenous Continuous Domenic Polite, MD 100 mL/hr at 06/23/15 1901    . 0.9 %  sodium chloride infusion  250 mL Intravenous Once [name],  MD   Stopped at 06/23/15 2056  . acetaminophen (TYLENOL) tablet 500 mg  500 mg Oral Q6H PRN Domenic Polite, MD      . acyclovir (ZOVIRAX) 200 MG capsule 400 mg  400 mg Oral Daily Domenic Polite, MD      . cefTRIAXone (ROCEPHIN) 1 g in dextrose 5 % 50 mL IVPB  1 g Intravenous Q24H Domenic Polite, MD      . enoxaparin (LOVENOX) injection 30 mg  30 mg Subcutaneous Q24H Domenic Polite, MD   30 mg at 06/23/15 2107  . heparin lock flush 100 unit/mL  500 Units Intracatheter Daily PRN [name], MD      . heparin lock flush 100 unit/mL  250 Units Intracatheter PRN [name], MD      . morphine 2 MG/ML injection 2 mg  2 mg Intravenous Q3H PRN Domenic Polite, MD      . ondansetron (ZOFRAN) injection 4 mg  4 mg Intravenous Q6H PRN Domenic Polite, MD      . ondansetron (ZOFRAN) tablet 4 mg  4 mg Oral Q6H PRN Domenic Polite, MD       Or  . ondansetron (ZOFRAN) injection 4 mg  4 mg Intravenous Q6H PRN Domenic Polite, MD      . sodium chloride flush (NS) 0.9 % injection 10 mL  10 mL Intracatheter PRN [name], MD      . sodium chloride flush (NS) 0.9 % injection 3 mL  3 mL Intracatheter PRN [name], MD      . traMADol Veatrice Bourbon) tablet 50 mg  50  mg Oral Q6H PRN Domenic Polite, MD        OBJECTIVE: Filed Vitals:   06/23/15 2230 06/24/15 0525  BP: 177/94 164/89  Pulse: 88 86  Temp: 97.5 F (36.4 C) 98.2 F (36.8 C)  Resp: 20 16     Body mass index is 27.06 kg/(m^2).       LAB RESULTS:  CMP     Component Value Date/Time   NA 137 06/24/2015 0338   NA 134* 06/23/2015 1215   K 4.0 06/24/2015 0338   K 4.2 06/23/2015 1215   CL 108 06/24/2015 0338   CO2 20* 06/24/2015 0338   CO2 20* 06/23/2015 1215   GLUCOSE 95 06/24/2015 0338   GLUCOSE 139 06/23/2015 1215   BUN 60* 06/24/2015 0338   BUN 63.9* 06/23/2015 1215   CREATININE 3.85* 06/24/2015 0338   CREATININE 4.3 Repeated and Verified* 06/23/2015 1215   CALCIUM 11.4* 06/24/2015 0338   CALCIUM 13.4 Repeated and  Verified* 06/23/2015 1215   PROT 6.7 06/24/2015 0338   PROT 6.6 06/11/2015 1010   PROT 7.5 06/11/2015 1010   ALBUMIN 1.6* 06/24/2015 0338   ALBUMIN 1.7* 06/11/2015 1010   AST 10* 06/24/2015 0338   AST 27 06/11/2015 1010   ALT 18 06/24/2015 0338   ALT 25 06/11/2015 1010   ALKPHOS 118 06/24/2015 0338   ALKPHOS 150 06/11/2015 1010   BILITOT 0.3 06/24/2015 0338   BILITOT <0.30 06/11/2015 1010   GFRNONAA 18* 06/24/2015 0338   GFRAA 21* 06/24/2015 0338    INo results found for: SPEP, UPEP  Lab Results  Component Value Date   WBC 15.0* 06/24/2015   NEUTROABS 16.5* 06/23/2015   HGB 8.0* 06/24/2015   HCT 24.6* 06/24/2015   MCV 95.0 06/24/2015   PLT 762* 06/24/2015    _0 @  No results found for: LABCA2  No components found for: LABCA125  No results for input(s): INR in the last 168 hours.  Urinalysis    Component Value Date/Time   COLORURINE RED* 06/23/2015 1430   APPEARANCEUR CLOUDY* 06/23/2015 1430   LABSPEC 1.013 06/23/2015 1430   LABSPEC 1.030 01/22/2015 1018   PHURINE 5.5 06/23/2015 1430   PHURINE 5.0 01/22/2015 1018   GLUCOSEU NEGATIVE 06/23/2015 1430   GLUCOSEU Negative 01/22/2015 1018   HGBUR LARGE* 06/23/2015 1430   HGBUR Large 01/22/2015 1018   BILIRUBINUR NEGATIVE 06/23/2015 1430   BILIRUBINUR Negative 01/22/2015 1018   KETONESUR NEGATIVE 06/23/2015 1430   KETONESUR Negative 01/22/2015 1018   PROTEINUR >300* 06/23/2015 1430   PROTEINUR 2000 01/22/2015 1018   UROBILINOGEN 0.2 01/22/2015 1018   UROBILINOGEN 0.2 01/31/2014 1050   NITRITE NEGATIVE 06/23/2015 1430   NITRITE Negative 01/22/2015 1018   LEUKOCYTESUR SMALL* 06/23/2015 1430   LEUKOCYTESUR Color Interference due to blood in urine 01/22/2015 1018       STUDIES: Ct Renal Stone Study  06/23/2015  CLINICAL DATA:  History multiple myeloma currently on chemotherapy. Chronic kidney disease. Nausea, vomiting and diarrhea 4 days. Generalized abdominal pain with chills and weakness as well  as dysuria and dark urine. Mid back pain. EXAM: CT ABDOMEN AND PELVIS WITHOUT CONTRAST TECHNIQUE: Multidetector CT imaging of the abdomen and pelvis was performed following the standard protocol without IV contrast. COMPARISON:  12/28/2014 and 02/03/2014 FINDINGS: Lung bases demonstrate very small bilateral pleural effusions with associated dependent atelectasis. Minimal cardiomegaly. The Abdominal images demonstrate the liver, spleen pancreas, gallbladder and adrenal glands to be within normal. Vascular structures are within normal. Kidneys are normal in size without  hydronephrosis or nephrolithiasis. 1.3 cm exophytic cyst over the mid pole of the left kidney. Ureters are normal. Stomach is normal. Appendix is normal. Small bowel is within normal. There is mild diverticulosis of the colon. Pelvic images demonstrate the bladder, prostate and rectum to be within normal. Stable 1 cm sclerotic focus over the left sacrum likely a bone island. Old right lower anterior rib fracture. Focal lucency along the left side of the T9 vertebral body with subtle patchy low density T7 and T8 not well seen previously and may represent evidence of patient's multiple myeloma. IMPRESSION: No acute findings in the abdomen/ pelvis. Subtle lucencies over the T7, T8 and T9 vertebral bodies not well seen previously and likely due to patient's known multiple myeloma. No compression fracture. Small bilateral pleural effusions with associated basilar atelectasis. Mild diverticulosis of the colon. 1.3 cm left renal cyst. Electronically Signed   By: Marin Olp M.D.   On: 06/23/2015 17:33    ASSESSMENT: 40 y.o. 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 (2.22 g/dL) and urine (145 mg/dL), bone marrow biopsy 02/03/2014 showing an average 12% plasmacytosis, with some sheets of abnormal plasma cells noted, cytogenetics pending; baseline beta 2 microglobulin was  7.36, baseline creatinine 1.64, with GFR 51, calcium on general 05/21/2014 was 10.8, LDH was normal  (1) Multiple myeloma stage IIA diagnosed January 2016;   I: CyBorD started January 2016 (a) dexamethasone 20 mg/d every TU/WED started 02/04/2014 (b) bortezomib sQ days 1,4,8,11 of every 21 day cycle started 02/10/2014 (c) cyclophosphamide 300 mg/M2 IV weekly started 02/13/2014   2: Lenalidomide 15m daily started 07/26/2014, discontinued December 2016 with progression  (2) chronic kidney disease stage III  (3) hypercalcemia: zolendronate started 02/10/2014-- repeat every 4 weeks initially, then every 12 weeks (a) changed to denosumab/Xgeva starting 02/26/2015 because of CKD, repeated every 12 weeks  (4) ID prophylaxis: (a) influenza and Pneumovax [PV13] vaccines 02/07/2014 (b) acyclovir started 02/07/2014 (c) HIV negative 01/31/2014 (d) Pneumovax P23 too be given after March 2016  (5). The patient is not a transplant candidate for financial reasons  (6) bilateral foot rash: Started on Septra DS and Diflucan 09/03/2014, resolved  (7) Multiple Myeloma relapse in December 2016.  (a) starting 02/05/15: dexamethasone 249mx2 consecutive days weekly; bortezomib sQ weekly,  (b) Septra DS twice daily (c) acyclovir 40020maily  (d) bortezomib/ dexamethasone discontinued after 05/06/2015 dose with evidence of progression  (8) started carfilzomib 05/28/2015, given with weekly low dose dexamethasone (a) tolerated cyclophosphamide poorly, discontinued after cycle 1 (b) carfilzomib started 05/28/2015, first cycle only 4 doses (c) taking dexamethasone 20 mg daily 2 days a week, not on treatment days)   PLAN: Charles [name];s situation is difficult. He understands myeloma is not curable with our current knowledge  base. The goal of treatment is control. Normally we would treat aggressively with a view to autologous transplant but he is not a candidate for financial reasons. Accordingly we have tried to do the minimum necessary to control his tumor so as to make maximum use of the therapies we do have available. Recently however his cancer has been progressing. At this point it is not clear whether his current symptoms are due to disease progression, poor tolerance of the carfilzomib, or an unrelated viral illness in this immunocompromised patient.  Active problems include pain, nausea/voming, diarrhea, hypercalcemia and worsening renal insufficiency. His calcium and creatinine are improving with hydration alone. I would suggest:  (1) pain:   (  a) start hydrocodone/APAP QID and PRN; if constipation develops add bowel prophylaxis  (b) thoracic spine films w a view to possible palliative XRT to painful area (2) diarrhea: add questran for now (3) nausea/ vomiting: compazine IV then po (4) hypercalcemia; continue IVF; add pamidronate; resume denosumab as outpatient; d/c vit D (5) myeloma: will decide whether to push on with current treatment, intensify or change; all his chemo is outpatient  I have written the appropriate orders. Once he is taking po well we can continue therapy as outpatient (including IVF). Would anticipate discharge in next 24-48 hours  Greatly appreciate your help to this patient!    [name], MD   06/24/2015 7:49 AM Medical Oncology and Hematology [facility] [address] [place], [place] [address] Tel. [phone]    Fax. [phone]

## 2015-06-24 NOTE — Care Management Note (Signed)
Case Management Note  Patient Details  Name: Charles [name] MRN: [id number] Date of Birth: 19-Dec-1975  Subjective/Objective:         41 yo admitted with AKI, HX of Multiple Myeloma           Action/Plan: From home with family.  Chart reviewed and no CM needs identified or communicated at this time.  Pt has a PCP (JEGEDE, OLUGBEMIGA, MD) at the [facility] and is a pt at the [facility].  CM will continue to follow for DC needs.  Expected Discharge Date:   (unknown)               Expected Discharge Plan:  Home/Self Care  In-House Referral:     Discharge planning Services  CM Consult  Post Acute Care Choice:    Choice offered to:     DME Arranged:    DME Agency:     HH Arranged:    HH Agency:     Status of Service:  In process, will continue to follow  Medicare Important Message Given:    Date Medicare IM Given:    Medicare IM give by:    Date Additional Medicare IM Given:    Additional Medicare Important Message give by:     If discussed at [place] of Stay Meetings, dates discussed:    Additional CommentsLynnell Catalan, RN 06/24/2015, 11:20 AM  [phone]

## 2015-06-25 ENCOUNTER — Other Ambulatory Visit: Payer: Self-pay | Admitting: Oncology

## 2015-06-25 ENCOUNTER — Inpatient Hospital Stay (HOSPITAL_COMMUNITY): Payer: Medicaid Other

## 2015-06-25 LAB — CBC
HCT: 26.6 % — ABNORMAL LOW (ref 39.0–52.0)
Hemoglobin: 8.6 g/dL — ABNORMAL LOW (ref 13.0–17.0)
MCH: 30.8 pg (ref 26.0–34.0)
MCHC: 32.3 g/dL (ref 30.0–36.0)
MCV: 95.3 fL (ref 78.0–100.0)
PLATELETS: 768 10*3/uL — AB (ref 150–400)
RBC: 2.79 MIL/uL — AB (ref 4.22–5.81)
RDW: 14.3 % (ref 11.5–15.5)
WBC: 14.6 10*3/uL — AB (ref 4.0–10.5)

## 2015-06-25 LAB — BASIC METABOLIC PANEL
ANION GAP: 8 (ref 5–15)
BUN: 49 mg/dL — ABNORMAL HIGH (ref 6–20)
CALCIUM: 11.4 mg/dL — AB (ref 8.9–10.3)
CO2: 20 mmol/L — ABNORMAL LOW (ref 22–32)
Chloride: 110 mmol/L (ref 101–111)
Creatinine, Ser: 3.84 mg/dL — ABNORMAL HIGH (ref 0.61–1.24)
GFR, EST AFRICAN AMERICAN: 21 mL/min — AB (ref >60)
GFR, EST NON AFRICAN AMERICAN: 18 mL/min — AB (ref >60)
GLUCOSE: 96 mg/dL (ref 65–99)
POTASSIUM: 3.7 mmol/L (ref 3.5–5.1)
SODIUM: 138 mmol/L (ref 135–145)

## 2015-06-25 LAB — URINE CULTURE

## 2015-06-25 MED ORDER — LENALIDOMIDE 15 MG PO CAPS
10.0000 mg | ORAL_CAPSULE | Freq: Every day | ORAL | Status: DC
  Administered 2015-06-25: 15 mg via ORAL

## 2015-06-25 MED ORDER — MORPHINE SULFATE ER 15 MG PO TBCR
15.0000 mg | EXTENDED_RELEASE_TABLET | Freq: Two times a day (BID) | ORAL | Status: DC
  Administered 2015-06-25 – 2015-06-30 (×11): 15 mg via ORAL
  Filled 2015-06-25 (×11): qty 1

## 2015-06-25 MED ORDER — HYDRALAZINE HCL 20 MG/ML IJ SOLN
10.0000 mg | Freq: Four times a day (QID) | INTRAMUSCULAR | Status: DC | PRN
Start: 2015-06-25 — End: 2015-06-26

## 2015-06-25 MED ORDER — HYDROCODONE-ACETAMINOPHEN 10-325 MG PO TABS
1.0000 | ORAL_TABLET | Freq: Four times a day (QID) | ORAL | Status: DC | PRN
Start: 2015-06-25 — End: 2015-06-26
  Administered 2015-06-25 – 2015-06-26 (×2): 1 via ORAL
  Filled 2015-06-25 (×2): qty 1

## 2015-06-25 MED ORDER — VANCOMYCIN HCL IN DEXTROSE 1-5 GM/200ML-% IV SOLN
1000.0000 mg | INTRAVENOUS | Status: DC
  Administered 2015-06-25 – 2015-06-29 (×3): 1000 mg via INTRAVENOUS
  Filled 2015-06-25 (×3): qty 200

## 2015-06-25 MED ORDER — DEXTROSE 5 % IV SOLN
1.0000 g | INTRAVENOUS | Status: DC
  Administered 2015-06-25 – 2015-06-30 (×6): 1 g via INTRAVENOUS
  Filled 2015-06-25 (×6): qty 1

## 2015-06-25 MED ORDER — PROCHLORPERAZINE EDISYLATE 5 MG/ML IJ SOLN
10.0000 mg | Freq: Three times a day (TID) | INTRAMUSCULAR | Status: DC | PRN
  Administered 2015-06-28 – 2015-07-08 (×3): 10 mg via INTRAVENOUS
  Filled 2015-06-25 (×3): qty 2

## 2015-06-25 MED ORDER — LENALIDOMIDE 10 MG PO CAPS: 10.0000 mg | ORAL_CAPSULE | Freq: Every day | ORAL | Status: DC

## 2015-06-25 MED ORDER — ACETAMINOPHEN 325 MG PO TABS
650.0000 mg | ORAL_TABLET | Freq: Four times a day (QID) | ORAL | Status: DC | PRN
  Administered 2015-06-25 – 2015-07-07 (×4): 650 mg via ORAL
  Filled 2015-06-25 (×5): qty 2

## 2015-06-25 NOTE — Progress Notes (Signed)
No significant change in Ca++ or creat-- expect further drop after pamidronate received yesterday; continuing IVF  Added MSContin 15 mg and changed hydrocodone/APAP to PRN; hopefully this combination will control pain well and can be easily contnued as outpatient.  Resumed revlimid at 10 mg/ day-- patient brought home supply  Encouraged pt to get OOB and ambulate. If pain is well controlled and we see some improvement in Ca++ tomorrow likely could be discharged then.  He is already scheduled for treatment in our office 5/31  Will follow with you. Again, thank you for your help to this patient!

## 2015-06-25 NOTE — Progress Notes (Signed)
Pharmacy Antibiotic Note  Charles [name] is a 40 y.o. male admitted on 06/23/2015 with pneumonia.  Pharmacy has been consulted for Vanc/Cefepime dosing.  Plan: Vancomycin 1g IV every 48 hours.  Goal trough 15-20 mcg/mL. The dose of Cefepime will be adjusted to 1g IV q24 based on renal function.  Height: '5\' 2"'$  (157.5 cm) Weight: 148 lb (67.132 kg) IBW/kg (Calculated) : 54.6  Temp (24hrs), Avg:99.4 F (37.4 C), Min:98.1 F (36.7 C), Max:100.5 F (38.1 C)   Recent Labs Lab 06/23/15 1215 06/23/15 1450 06/23/15 1507 06/24/15 0338 06/25/15 0324  WBC 19.8* 21.2*  --  15.0* 14.6*  CREATININE 4.3 Repeated and Verified* 4.25*  --  3.85* 3.84*  LATICACIDVEN  --   --  0.78  --   --     Estimated Creatinine Clearance: 21.6 mL/min (by C-G formula based on Cr of 3.84).    Allergies  Allergen Reactions  . Ciprofloxacin Rash  . Mobic [Meloxicam] Rash     Thank you for allowing pharmacy to be a part of this patient's care.   Adrian Saran, PharmD, BCPS Pager [phone] 06/25/2015 4:12 PM

## 2015-06-25 NOTE — Progress Notes (Signed)
PROGRESS NOTE    Charles [name]  LFY:[id number] DOB: 1975/06/03 DOA: 06/23/2015 PCP: [name], MD   Brief Narrative: Charles [name] is a 40 y.o. male with medical history significant of Multiple Myeloma, CKD 3, back pain, currently undergoing chemotherapy, reports he was started on a new chemotherapeutic agent recently, last chemo was last week, went to the cancer center with Nausea, Vomiting, diarrhea, fever and chills x 4days and was sent to the ER. ED Course: Temp of 99.1, labs notable for creatinine of 4.2 up from 3.3 10 days ago   Assessment & Plan:   Principal Problem:   AKI (acute kidney injury) ([place]) Active Problems:   Multiple myeloma ([facility])   CKD (chronic kidney disease) stage 3, GFR 30-59 ml/min   Nausea with vomiting   AKI (acute kidney injury) ([place]) -on CKD 3, baseline is 3.3 -due to GI losses and poor Po intake -hydrate, monitor urine out put.  - [place] negative for hydronephrosis.  -Continue with IV fluids. Good urine out put    Multiple myeloma ([facility]) -on chemo, followed by Dr.Magrinat, = -elevated WBC could be due to hemoconcentration, decadron with chemo +/- infection -Appreciate Dr Jana Hakim Helps.  -pain not well controlled. Plan to start MScontin today   UTI;  Abnormal UA, some symptoms with urination reported -continue with  IV ceftriaxone, FU cultures, monitor Urine multiples bacteria, will send recollection.    Nausea with vomiting -could be from chemo, ? Infection -supportive care, hydrate, Abx, monitor -improved. Advance diet as tolerated.  Improved.   Fevers; check chest x ray  Presume UTI.   DVT prophylaxis; Lovenox.  Code Status: full code.  Family Communication: care discussed with wife.  Disposition Plan: remain inpatient, home tolerates diet, and renal function improved.    Consultants:   Dr Jana Hakim.   Procedures;   Renal US; medical renal disease, no hydronephrosis.   Antimicrobials:  Ceftriaxone  5-24   Subjective: Pain not well controlled. Started today on MS contin.  Diarrhea improved.    Objective: Filed Vitals:   06/24/15 1427 06/24/15 2212 06/25/15 0546 06/25/15 1254  BP: 174/92 172/99 173/95 183/103  Pulse: 93 97 105 116  Temp: 98.8 F (37.1 C) 98.1 F (36.7 C) 98.6 F (37 C) 100.5 F (38.1 C)  TempSrc: Oral Oral Oral Oral  Resp: _0 Height:      Weight:      SpO2: 99% 98% 97% 99%    Intake/Output Summary (Last 24 hours) at 06/25/15 1425 Last data filed at 06/25/15 1055  Gross per 24 hour  Intake    360 ml  Output   1825 ml  Net  -1465 ml   Filed Weights   06/23/15 1430 06/23/15 1433  Weight: 67.132 kg (148 lb) 67.132 kg (148 lb)    Examination:  General exam: Appears calm and comfortable  Respiratory system: Clear to auscultation. Respiratory effort normal. Cardiovascular system: S1 & S2 heard, RRR. No JVD, murmurs, rubs, gallops or clicks. No pedal edema. Gastrointestinal system: Abdomen is nondistended, soft and nontender. No organomegaly or masses felt. Normal bowel sounds heard. Central nervous system: Alert and oriented. No focal neurological deficits. Extremities: Symmetric 5 x 5 power. Skin: No rashes, lesions or ulcers    Data Reviewed: I have personally reviewed following labs and imaging studies  CBC:  Recent Labs Lab 06/23/15 1215 06/23/15 1450 06/24/15 0338 06/25/15 0324  WBC 19.8* 21.2* 15.0* 14.6*  NEUTROABS 16.8* 16.5*  --   --  HGB 9.6* 9.1* 8.0* 8.6*  HCT 29.0* 27.6* 24.6* 26.6*  MCV 93.3 93.6 95.0 95.3  PLT 785* 852* 762* 341*   Basic Metabolic Panel:  Recent Labs Lab 06/23/15 1215 06/23/15 1450 06/24/15 0338 06/25/15 0324  NA 134* 133* 137 138  K 4.2 4.0 4.0 3.7  CL  --  101 108 110  CO2 20* 21* 20* 20*  GLUCOSE 139 115* 95 96  BUN 63.9* 66* 60* 49*  CREATININE 4.3 Repeated and Verified* 4.25* 3.85* 3.84*  CALCIUM 13.4 Repeated and Verified* 12.2* 11.4* 11.4*   GFR: Estimated Creatinine  Clearance: 21.6 mL/min (by C-G formula based on Cr of 3.84). Liver Function Tests:  Recent Labs Lab 06/23/15 1450 06/24/15 0338  AST 12* 10*  ALT 23 18  ALKPHOS 152* 118  BILITOT 0.4 0.3  PROT 7.9 6.7  ALBUMIN 1.9* 1.6*   No results for input(s): LIPASE, AMYLASE in the last 168 hours. No results for input(s): AMMONIA in the last 168 hours. Coagulation Profile: No results for input(s): INR, PROTIME in the last 168 hours. Cardiac Enzymes: No results for input(s): CKTOTAL, CKMB, CKMBINDEX, TROPONINI in the last 168 hours. BNP (last 3 results) No results for input(s): PROBNP in the last 8760 hours. HbA1C: No results for input(s): HGBA1C in the last 72 hours. CBG: No results for input(s): GLUCAP in the last 168 hours. Lipid Profile: No results for input(s): CHOL, HDL, LDLCALC, TRIG, CHOLHDL, LDLDIRECT in the last 72 hours. Thyroid Function Tests: No results for input(s): TSH, T4TOTAL, FREET4, T3FREE, THYROIDAB in the last 72 hours. Anemia Panel: No results for input(s): VITAMINB12, FOLATE, FERRITIN, TIBC, IRON, RETICCTPCT in the last 72 hours. Sepsis Labs:  Recent Labs Lab 06/23/15 1507  LATICACIDVEN 0.78    Recent Results (from the past 240 hour(s))  Urine culture     Status: Abnormal   Collection Time: 06/23/15  2:30 PM  Result Value Ref Range Status   Specimen Description URINE, CLEAN CATCH  Final   Special Requests NONE  Final   Culture MULTIPLE SPECIES PRESENT, SUGGEST RECOLLECTION (A)  Final   Report Status 06/25/2015 FINAL  Final  Blood Culture (routine x 2)     Status: None (Preliminary result)   Collection Time: 06/23/15  2:45 PM  Result Value Ref Range Status   Specimen Description BLOOD RIGHT ANTECUBITAL  Final   Special Requests BOTTLES DRAWN AEROBIC AND ANAEROBIC 5 CC EA  Final   Culture   Final    NO GROWTH < 24 HOURS Performed at [facility]    Report Status PENDING  Incomplete  Blood Culture (routine x 2)     Status: None (Preliminary  result)   Collection Time: 06/23/15  3:29 PM  Result Value Ref Range Status   Specimen Description BLOOD RIGHT HAND  Final   Special Requests BOTTLES DRAWN AEROBIC ONLY 9ML  Final   Culture   Final    NO GROWTH < 24 HOURS Performed at [facility]    Report Status PENDING  Incomplete         Radiology Studies: US Renal Port  06/24/2015  CLINICAL DATA:  Acute kidney injury. EXAM: RENAL / URINARY TRACT ULTRASOUND COMPLETE COMPARISON:  CT scan of Jun 23, 2015. FINDINGS: Right Kidney: Length: 12.1 cm. Mildly increased echogenicity of renal parenchyma is noted. No mass or hydronephrosis visualized. Left Kidney: Length: 11.9 cm. Mildly increased echogenicity of renal parenchyma is noted. 1.6 cm simple exophytic cyst is seen arising from midpole. No mass  or hydronephrosis visualized. Bladder: Appears normal for degree of bladder distention. IMPRESSION: Mildly increased echogenicity of renal parenchyma is noted bilaterally suggesting medical renal disease. No hydronephrosis or renal obstruction is noted. Electronically Signed   By: Marijo Conception, M.D.   On: 06/24/2015 09:50   Ct Renal Stone Study  06/23/2015  CLINICAL DATA:  History multiple myeloma currently on chemotherapy. Chronic kidney disease. Nausea, vomiting and diarrhea 4 days. Generalized abdominal pain with chills and weakness as well as dysuria and dark urine. Mid back pain. EXAM: CT ABDOMEN AND PELVIS WITHOUT CONTRAST TECHNIQUE: Multidetector CT imaging of the abdomen and pelvis was performed following the standard protocol without IV contrast. COMPARISON:  12/28/2014 and 02/03/2014 FINDINGS: Lung bases demonstrate very small bilateral pleural effusions with associated dependent atelectasis. Minimal cardiomegaly. The Abdominal images demonstrate the liver, spleen pancreas, gallbladder and adrenal glands to be within normal. Vascular structures are within normal. Kidneys are normal in size without hydronephrosis or nephrolithiasis.  1.3 cm exophytic cyst over the mid pole of the left kidney. Ureters are normal. Stomach is normal. Appendix is normal. Small bowel is within normal. There is mild diverticulosis of the colon. Pelvic images demonstrate the bladder, prostate and rectum to be within normal. Stable 1 cm sclerotic focus over the left sacrum likely a bone island. Old right lower anterior rib fracture. Focal lucency along the left side of the T9 vertebral body with subtle patchy low density T7 and T8 not well seen previously and may represent evidence of patient's multiple myeloma. IMPRESSION: No acute findings in the abdomen/ pelvis. Subtle lucencies over the T7, T8 and T9 vertebral bodies not well seen previously and likely due to patient's known multiple myeloma. No compression fracture. Small bilateral pleural effusions with associated basilar atelectasis. Mild diverticulosis of the colon. 1.3 cm left renal cyst. Electronically Signed   By: Marin Olp M.D.   On: 06/23/2015 17:33        Scheduled Meds: . sodium chloride  250 mL Intravenous Once  . acyclovir  400 mg Oral Daily  . cefTRIAXone (ROCEPHIN) IVPB 1 gram/50 mL D5W  1 g Intravenous Q24H  . cholestyramine  4 g Oral BID  . enoxaparin (LOVENOX) injection  30 mg Subcutaneous Q24H  . feeding supplement (ENSURE ENLIVE)  237 mL Oral TID BM  . lenalidomide  10 mg Oral Daily  . mirtazapine  15 mg Oral QHS  . morphine  15 mg Oral Q12H  . prochlorperazine  10 mg Intravenous TID   Continuous Infusions: . sodium chloride 100 mL/hr at 06/25/15 1004     LOS: 2 days    Time spent: 25 minutes.     Elmarie Shiley, MD [facility] Pager [phone]  If 7PM-7AM, please contact night-coverage [email or web address] Password [email or web address] 06/25/2015, 2:25 PM

## 2015-06-26 ENCOUNTER — Inpatient Hospital Stay (HOSPITAL_COMMUNITY): Payer: Medicaid Other

## 2015-06-26 ENCOUNTER — Ambulatory Visit: Payer: Self-pay | Admitting: Radiation Oncology

## 2015-06-26 DIAGNOSIS — I6783 Posterior reversible encephalopathy syndrome: Secondary | ICD-10-CM

## 2015-06-26 DIAGNOSIS — H538 Other visual disturbances: Secondary | ICD-10-CM

## 2015-06-26 DIAGNOSIS — I10 Essential (primary) hypertension: Secondary | ICD-10-CM

## 2015-06-26 LAB — BASIC METABOLIC PANEL
Anion gap: 8 (ref 5–15)
BUN: 47 mg/dL — AB (ref 6–20)
CALCIUM: 10.4 mg/dL — AB (ref 8.9–10.3)
CO2: 21 mmol/L — ABNORMAL LOW (ref 22–32)
Chloride: 108 mmol/L (ref 101–111)
Creatinine, Ser: 3.79 mg/dL — ABNORMAL HIGH (ref 0.61–1.24)
GFR calc Af Amer: 21 mL/min — ABNORMAL LOW (ref >60)
GFR, EST NON AFRICAN AMERICAN: 18 mL/min — AB (ref >60)
GLUCOSE: 111 mg/dL — AB (ref 65–99)
Potassium: 3.7 mmol/L (ref 3.5–5.1)
Sodium: 137 mmol/L (ref 135–145)

## 2015-06-26 LAB — CBC
HCT: 24.9 % — ABNORMAL LOW (ref 39.0–52.0)
Hemoglobin: 8.5 g/dL — ABNORMAL LOW (ref 13.0–17.0)
MCH: 31.8 pg (ref 26.0–34.0)
MCHC: 34.1 g/dL (ref 30.0–36.0)
MCV: 93.3 fL (ref 78.0–100.0)
PLATELETS: 648 10*3/uL — AB (ref 150–400)
RBC: 2.67 MIL/uL — ABNORMAL LOW (ref 4.22–5.81)
RDW: 14.2 % (ref 11.5–15.5)
WBC: 16.1 10*3/uL — ABNORMAL HIGH (ref 4.0–10.5)

## 2015-06-26 LAB — STREP PNEUMONIAE URINARY ANTIGEN: Strep Pneumo Urinary Antigen: NEGATIVE

## 2015-06-26 LAB — MRSA PCR SCREENING: MRSA by PCR: NEGATIVE

## 2015-06-26 MED ORDER — LABETALOL HCL 5 MG/ML IV SOLN
0.5000 mg/min | INTRAVENOUS | Status: DC
  Administered 2015-06-26: 0.5 mg/min via INTRAVENOUS
  Filled 2015-06-26: qty 80

## 2015-06-26 MED ORDER — HYDRALAZINE HCL 20 MG/ML IJ SOLN
10.0000 mg | Freq: Four times a day (QID) | INTRAMUSCULAR | Status: DC | PRN
  Administered 2015-06-26: 10 mg via INTRAVENOUS
  Filled 2015-06-26: qty 1

## 2015-06-26 MED ORDER — LABETALOL HCL 5 MG/ML IV SOLN
10.0000 mg | Freq: Once | INTRAVENOUS | Status: AC
  Administered 2015-06-26: 10 mg via INTRAVENOUS
  Filled 2015-06-26: qty 4

## 2015-06-26 MED ORDER — HYDROCODONE-ACETAMINOPHEN 10-325 MG PO TABS
1.0000 | ORAL_TABLET | ORAL | Status: DC | PRN
  Administered 2015-06-29 – 2015-07-01 (×2): 1 via ORAL
  Administered 2015-07-08: 2 via ORAL
  Filled 2015-06-26 (×2): qty 2
  Filled 2015-06-26: qty 1
  Filled 2015-06-26: qty 2

## 2015-06-26 MED ORDER — DOCUSATE SODIUM 100 MG PO CAPS
100.0000 mg | ORAL_CAPSULE | Freq: Every day | ORAL | Status: DC
  Administered 2015-06-26 – 2015-06-30 (×5): 100 mg via ORAL
  Filled 2015-06-26 (×5): qty 1

## 2015-06-26 MED ORDER — DEXAMETHASONE 4 MG PO TABS
20.0000 mg | ORAL_TABLET | Freq: Every day | ORAL | Status: DC
  Administered 2015-06-26: 20 mg via ORAL
  Filled 2015-06-26: qty 5

## 2015-06-26 MED ORDER — HYDRALAZINE HCL 20 MG/ML IJ SOLN: 10.0000 mg | Freq: Four times a day (QID) | INTRAMUSCULAR | Status: DC | PRN

## 2015-06-26 MED ORDER — LABETALOL HCL 5 MG/ML IV SOLN
10.0000 mg | INTRAVENOUS | Status: DC | PRN
  Filled 2015-06-26: qty 4

## 2015-06-26 MED ORDER — AMLODIPINE BESYLATE 10 MG PO TABS
10.0000 mg | ORAL_TABLET | Freq: Every day | ORAL | Status: DC
  Administered 2015-06-26 – 2015-06-29 (×4): 10 mg via ORAL
  Filled 2015-06-26 (×4): qty 1

## 2015-06-26 MED ORDER — POLYETHYLENE GLYCOL 3350 17 G PO PACK
17.0000 g | PACK | Freq: Every day | ORAL | Status: DC
  Administered 2015-06-26 – 2015-07-10 (×14): 17 g via ORAL
  Filled 2015-06-26 (×14): qty 1

## 2015-06-26 NOTE — Consult Note (Signed)
Radiation Oncology         [phone]) [phone] ________________________________  Name: Bradie Lacock MRN: [id number]  Date: 06/23/2015  DOB: 05/20/1975  IR:CVELFY, Gabrielle Dare, MD  No ref. provider found     REFERRING PHYSICIAN: No ref. provider found   DIAGNOSIS: The primary encounter diagnosis was Acute on chronic renal failure ([place]). Diagnoses of UTI (lower urinary tract infection), Vomiting and diarrhea, AKI (acute kidney injury) ([place]), Symptomatic anemia, Multiple myeloma in relapse ([place]), CKD (chronic kidney disease) stage 3, GFR 30-59 ml/min, Fever, and Blurry vision, bilateral were also pertinent to this visit.   HISTORY OF PRESENT ILLNESS: Jadan Zacarias Pontes is a 40 y.o. male seen at the request of Dr. Jana Hakim for evaluation of lytic bone lesions due to his Myeloma causing significant pain. The patient was diagnosed with Multiple Myeloma in 2016 and has been systemic therapy under the care of Dr. Jana Hakim. He has most recently been on Kyprolis and presented to the emergency room after being seein in Dr. Virgie Dad ofice on 06/23/15 with nausea, vomiting, diarrhea, and dehydration. His treatment was held that day, and he did have a CT renal stone study in the ED revealing no acute findings in the abdomen/pelvis but did reveal subtl lucencies of T7, T8, and T9. He did not have any compression fractures, and also had a chest x-ray on 06/25/15 revealing small bilateral effusions, streaky density in the lung bases left>right, with right apical extrapleural soft tissue denisty , and denities over the right posterior lateral 5th rib, and possible expansion of the right anterior 5th rib. There is a remote fracture in the right 2nd anterior rib. We are asked to see the patient to discuss the role of palliative radiotherapy for pain management. He is currently receiving Norco 10/325 1-2 q 4 hours prn, and MS Contin '15mg'$  BID.   Of note upon entering the room, internal medicine and neurology were at the  bedside evaluating the patient after he was found to have increasing visual changes. Due to this, the encounter was brief.     PREVIOUS RADIATION THERAPY: No   PAST MEDICAL HISTORY:  Past Medical History  Diagnosis Date  . IgG myeloma ([place])   . CKD (chronic kidney disease), stage III   . Hypercholesterolemia   . Lytic bone lesions on xray        PAST SURGICAL HISTORY:History reviewed. No pertinent past surgical history.   FAMILY HISTORY:  Family History  Problem Relation Age of Onset  . Diabetes Father      SOCIAL HISTORY:  reports that he has never smoked. He has never used smokeless tobacco. He reports that he does not drink alcohol or use illicit drugs. The patient is married and resides in [place].   ALLERGIES: Ciprofloxacin and Mobic   MEDICATIONS:  Current Facility-Administered Medications  Medication Dose Route Frequency Provider Last Rate Last Dose  . 0.9 %  sodium chloride infusion   Intravenous Continuous Domenic Polite, MD 100 mL/hr at 06/26/15 0421    . 0.9 %  sodium chloride infusion  250 mL Intravenous Once [name], MD   Stopped at 06/23/15 2056  . acetaminophen (TYLENOL) tablet 650 mg  650 mg Oral Q6H PRN Belkys A Regalado, MD   650 mg at 06/25/15 1443  . acyclovir (ZOVIRAX) 200 MG capsule 400 mg  400 mg Oral Daily Domenic Polite, MD   400 mg at 06/26/15 0941  . ceFEPIme (MAXIPIME) 1 g in dextrose 5 % 50 mL IVPB  1 g Intravenous  Q24H Belkys A Regalado, MD   1 g at 06/25/15 1909  . dexamethasone (DECADRON) tablet 20 mg  20 mg Oral Daily [name], MD   20 mg at 06/26/15 0941  . docusate sodium (COLACE) capsule 100 mg  100 mg Oral Daily [name], MD   100 mg at 06/26/15 0941  . enoxaparin (LOVENOX) injection 30 mg  30 mg Subcutaneous Q24H Domenic Polite, MD   30 mg at 06/25/15 2156  . feeding supplement (ENSURE ENLIVE) (ENSURE ENLIVE) liquid 237 mL  237 mL Oral TID BM Clayton Bibles, RD   237 mL at 06/25/15 2156  . heparin lock  flush 100 unit/mL  500 Units Intracatheter Daily PRN [name], MD      . heparin lock flush 100 unit/mL  250 Units Intracatheter PRN [name], MD      . hydrALAZINE (APRESOLINE) injection 10 mg  10 mg Intravenous Q6H PRN Belkys A Regalado, MD   10 mg at 06/26/15 1101  . HYDROcodone-acetaminophen (NORCO) 10-325 MG per tablet 1-2 tablet  1-2 tablet Oral Q4H PRN [name], MD      . lenalidomide (REVLIMID) capsule 10 mg  10 mg Oral Daily [name], MD   10 mg at 06/25/15 1128  . mirtazapine (REMERON) tablet 15 mg  15 mg Oral QHS [name], MD   15 mg at 06/25/15 2156  . morphine (MS CONTIN) 12 hr tablet 15 mg  15 mg Oral Q12H [name], MD   15 mg at 06/25/15 2156  . polyethylene glycol (MIRALAX / GLYCOLAX) packet 17 g  17 g Oral Daily [name], MD   17 g at 06/26/15 0940  . prochlorperazine (COMPAZINE) injection 10 mg  10 mg Intravenous TID PRN Belkys A Regalado, MD      . sodium chloride flush (NS) 0.9 % injection 10 mL  10 mL Intracatheter PRN [name], MD      . sodium chloride flush (NS) 0.9 % injection 3 mL  3 mL Intracatheter PRN [name], MD      . vancomycin (VANCOCIN) IVPB 1000 mg/200 mL premix  1,000 mg Intravenous Q48H Adrian Saran, RPH   1,000 mg at 06/25/15 1754     REVIEW OF SYSTEMS: On review of systems, the patient states he has been experiencing increasing pain in the back and bilateral ribs particularly with deep inspiration. He feels as though the pain medication he is on is helping somewhat. Over the last 4 days, he has noticed increasing blurriness of his vision. He is not experiencing crushing chest pain or tightness. Apparently since  He does not complain of abdominal pain, nausea, or vomiting currently. No bowel or bladder dysfunction is noted. No other complaints are noted.    PHYSICAL EXAM:  height is _0  (1.575 m) and weight is 148 lb (67.132 kg). His oral temperature is 97.9 F (36.6 C). His  blood pressure is 173/98 and his pulse is 101. His respiration is 20 and oxygen saturation is 97%.   Pain scale 8/10 In general this is a well appearing hispanic male in no acute distress. He is alert and oriented x4 and somewhat flat throughout the examination. Cardiopulmonary assessment is negative for acute distress and he exhibits normal effort. He is being examined by neurology at bedside and has focal findings of loss of peripheral vision, unable to discern colors, nor track moving objects within his visual fields.  ECOG = 3  0 - Asymptomatic (Fully active, able to carry on all predisease activities without restriction)  1 - Symptomatic but completely ambulatory (Restricted in physically strenuous activity but ambulatory and able to carry out work of a light or sedentary nature. For example, light housework, office work)  2 - Symptomatic, <50% in bed during the day (Ambulatory and capable of all self care but unable to carry out any work activities. Up and about more than 50% of waking hours)  3 - Symptomatic, >50% in bed, but not bedbound (Capable of only limited self-care, confined to bed or chair 50% or more of waking hours)  4 - Bedbound (Completely disabled. Cannot carry on any self-care. Totally confined to bed or chair)  5 - Death   Eustace Pen MM, Creech RH, Tormey DC, et al. [phone]). "Toxicity and response criteria of the [facility] Group". [place] Oncol. 5 (6): 649-55    LABORATORY DATA:  Lab Results  Component Value Date   WBC 16.1* 06/26/2015   HGB 8.5* 06/26/2015   HCT 24.9* 06/26/2015   MCV 93.3 06/26/2015   PLT 648* 06/26/2015   Lab Results  Component Value Date   NA 137 06/26/2015   K 3.7 06/26/2015   CL 108 06/26/2015   CO2 21* 06/26/2015   Lab Results  Component Value Date   ALT 18 06/24/2015   AST 10* 06/24/2015   ALKPHOS 118 06/24/2015   BILITOT 0.3 06/24/2015      RADIOGRAPHY: Dg Chest 2 View  06/25/2015  CLINICAL DATA:  Pt  complains of Nausea, Vomiting, diarrhea, fever and chills x 4 days. Hx of multiple myeloma and on chemo. EXAM: CHEST  2 VIEW COMPARISON:  Multiple prior studies including 01/30/2014 and January 23, 2015 FINDINGS: Shallow lung inflation. The heart is mildly prominent in size. There are small bilateral pleural effusions. Minimal streaky density identified at the lung bases, left greater than right. There is right apical extrapleural soft tissue density. Rounded soft tissue density overlies the right posterior lateral fifth rib and there is possible expansion of the right anterior fifth rib. There is a remote fracture of the right second anterior rib. IMPRESSION: 1. Interval change in the appearance of the chest. 2. Increased soft tissue masses and possible expansion of the right anterior fifth rib. 3. 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 Consider further evaluation with CT of the chest as needed. 4. Bilateral lower lobe atelectasis and/or infiltrates, left greater than right. Electronically Signed   By: Nolon Nations M.D.   On: 06/25/2015 15:58   US Renal Port  06/24/2015  CLINICAL DATA:  Acute kidney injury. EXAM: RENAL / URINARY TRACT ULTRASOUND COMPLETE COMPARISON:  CT scan of Jun 23, 2015. FINDINGS: Right Kidney: Length: 12.1 cm. Mildly increased echogenicity of renal parenchyma is noted. No mass or hydronephrosis visualized. Left Kidney: Length: 11.9 cm. Mildly increased echogenicity of renal parenchyma is noted. 1.6 cm simple exophytic cyst is seen arising from midpole. No mass or hydronephrosis visualized. Bladder: Appears normal for degree of bladder distention. IMPRESSION: Mildly increased echogenicity of renal parenchyma is noted bilaterally suggesting medical renal disease. No hydronephrosis or renal obstruction is noted. Electronically Signed   By: Marijo Conception, M.D.   On: 06/24/2015 09:50   Ct Renal Stone Study  06/23/2015  CLINICAL DATA:  History multiple myeloma currently on chemotherapy. Chronic  kidney disease. Nausea, vomiting and diarrhea 4 days. Generalized abdominal pain with chills and weakness as well as dysuria and dark urine. Mid back  pain. EXAM: CT ABDOMEN AND PELVIS WITHOUT CONTRAST TECHNIQUE: Multidetector CT imaging of the abdomen and pelvis was performed following the standard protocol without IV contrast. COMPARISON:  12/28/2014 and 02/03/2014 FINDINGS: Lung bases demonstrate very small bilateral pleural effusions with associated dependent atelectasis. Minimal cardiomegaly. The Abdominal images demonstrate the liver, spleen pancreas, gallbladder and adrenal glands to be within normal. Vascular structures are within normal. Kidneys are normal in size without hydronephrosis or nephrolithiasis. 1.3 cm exophytic cyst over the mid pole of the left kidney. Ureters are normal. Stomach is normal. Appendix is normal. Small bowel is within normal. There is mild diverticulosis of the colon. Pelvic images demonstrate the bladder, prostate and rectum to be within normal. Stable 1 cm sclerotic focus over the left sacrum likely a bone island. Old right lower anterior rib fracture. Focal lucency along the left side of the T9 vertebral body with subtle patchy low density T7 and T8 not well seen previously and may represent evidence of patient's multiple myeloma. IMPRESSION: No acute findings in the abdomen/ pelvis. Subtle lucencies over the T7, T8 and T9 vertebral bodies not well seen previously and likely due to patient's known multiple myeloma. No compression fracture. Small bilateral pleural effusions with associated basilar atelectasis. Mild diverticulosis of the colon. 1.3 cm left renal cyst. Electronically Signed   By: Marin Olp M.D.   On: 06/23/2015 17:33       IMPRESSION/PLAN: 1. Multiple Myeloma with bone pain. The patient continues to have symptoms of pain in the ribs and low back bilaterally. Initially we were planning to offer palliative radiotherapy and simulation, however we will plan to  follow up with him next week to reconsider this as his visual changes are prompting a more acute need and work up at this time. We will continue to defer pain management to internal medicine. 2.  Visual changes. The patient has been evaluated by neurology and is being worked up for PRES given his visual symptoms and elevated blood pressures. He will go for a stat MRI of the brain to rule out CVA and brain mets. We will follow up with the results of his imaging studies.      Carola Rhine, PAC

## 2015-06-26 NOTE — Consult Note (Signed)
NEURO HOSPITALIST CONSULT NOTE   Requestig physician: Dr. Tyrell Antonio   Reason for Consult: PRES    HPI:                                                                                                                                          Charles [name] is an 40 y.o. male with medical history significant of Multiple Myeloma, CKD 3, back pain, currently undergoing chemotherapy on revlimid at 10 mg/ day . Presented to ED with N/V and chills. He was found to have possible UTI.  Translation was needed but family in room was able to translate. He states 4 days ago he noted a slow progression of blurred vision bilaterally. This has worsened over the last 4 days. He now is unable to track my finger see the color of my glove. He is a poor historian and it is difficult to get a good visual exam due to this.   Past Medical History  Diagnosis Date  . IgG myeloma ([place])   . CKD (chronic kidney disease), stage III   . Hypercholesterolemia   . Lytic bone lesions on xray     History reviewed. No pertinent past surgical history.  Family History  Problem Relation Age of Onset  . Diabetes Father     Social History:  reports that he has never smoked. He has never used smokeless tobacco. He reports that he does not drink alcohol or use illicit drugs.  Allergies  Allergen Reactions  . Ciprofloxacin Rash  . Mobic [Meloxicam] Rash    MEDICATIONS:                                                                                                                     Prior to Admission:  Prescriptions prior to admission  Medication Sig Dispense Refill Last Dose  . acetaminophen (TYLENOL) 500 MG tablet Take 500 mg by mouth every 6 (six) hours as needed for mild pain, moderate pain, fever or headache.   06/21/2015  . acyclovir (ZOVIRAX) 200 MG capsule Take 2 capsules (400 mg total) by mouth daily. (Patient taking differently: Take 400 mg by mouth daily. Scheduled everyday) 60 capsule  6 06/23/2015 at Unknown time  . Cholecalciferol (VITAMIN D PO) Take 500 Units by mouth daily.  06/23/2015 at Unknown time  . dexamethasone (DECADRON) 4 MG tablet Take 5 tablets (33m) twice a week, not on treatment day (tome 5 pastillas dos veces a la semana, no en el dia de tratamiento) 40 tablet 4 Past Week at Unknown time  . lenalidomide (REVLIMID) 10 MG capsule Take 10 mg by mouth daily.   06/25/2015 at Unknown time  . prochlorperazine (COMPAZINE) 10 MG tablet Take 1 tablet (10 mg total) by mouth every 6 (six) hours as needed for nausea or vomiting. 60 tablet 6 06/23/2015 at Unknown time  . traMADol (ULTRAM) 50 MG tablet Take 1 tablet (50 mg total) by mouth every 6 (six) hours as needed. (Patient taking differently: Take 50 mg by mouth every 6 (six) hours as needed for moderate pain. ) 60 tablet 5 06/23/2015 at Unknown time   Scheduled: . sodium chloride  250 mL Intravenous Once  . acyclovir  400 mg Oral Daily  . ceFEPime (MAXIPIME) IV  1 g Intravenous Q24H  . dexamethasone  20 mg Oral Daily  . docusate sodium  100 mg Oral Daily  . enoxaparin (LOVENOX) injection  30 mg Subcutaneous Q24H  . feeding supplement (ENSURE ENLIVE)  237 mL Oral TID BM  . lenalidomide  10 mg Oral Daily  . mirtazapine  15 mg Oral QHS  . morphine  15 mg Oral Q12H  . polyethylene glycol  17 g Oral Daily  . vancomycin  1,000 mg Intravenous Q48H     ROS:                                                                                                                                       History obtained from the patient  General ROS: negative for - chills, fatigue, fever, night sweats, weight gain or weight loss Psychological ROS: negative for - behavioral disorder, hallucinations, memory difficulties, mood swings or suicidal ideation Ophthalmic ROS: positive for - blurry vision,  ENT ROS: negative for - epistaxis, nasal discharge, oral lesions, sore throat, tinnitus or vertigo Allergy and Immunology ROS: negative for  - hives or itchy/watery eyes Hematological and Lymphatic ROS: negative for - bleeding problems, bruising or swollen lymph nodes Endocrine ROS: negative for - galactorrhea, hair pattern changes, polydipsia/polyuria or temperature intolerance Respiratory ROS: negative for - cough, hemoptysis, shortness of breath or wheezing Cardiovascular ROS: negative for - chest pain, dyspnea on exertion, edema or irregular heartbeat Gastrointestinal ROS: negative for - abdominal pain, diarrhea, hematemesis, nausea/vomiting or stool incontinence Genito-Urinary ROS: negative for - dysuria, hematuria, incontinence or urinary frequency/urgency Musculoskeletal ROS: negative for - joint swelling or muscular weakness Neurological ROS: as noted in HPI Dermatological ROS: negative for rash and skin lesion changes   Blood pressure 173/98, pulse 101, temperature 97.9 F (36.6 C), temperature source Oral, resp. rate 20, height 5' 2"  (1.575 m), weight 67.132 kg (148 lb), SpO2 97 %.   Neurologic Examination:  HEENT-  Normocephalic, no lesions, without obvious abnormality.  Normal external eye and conjunctiva.  Normal TM's bilaterally.  Normal auditory canals and external ears. Normal external nose, mucus membranes and septum.  Normal pharynx. Cardiovascular- S1, S2 normal, pulses palpable throughout   Lungs- chest clear, no wheezing, rales, normal symmetric air entry Abdomen- normal findings: bowel sounds normal Extremities- no edema Lymph-no adenopathy palpable Musculoskeletal-no joint tenderness, deformity or swelling Skin-warm and dry, no hyperpigmentation, vitiligo, or suspicious lesions  Neurological Examination Mental Status: Alert, oriented, thought content appropriate.  Speech fluent without evidence of aphasia.   Cranial Nerves: II: Visual fields grossly abnormal and difficult to assess. He tends to only have  central vision and poor peripheral vision along with inability to see color of my glove. He is not fully tracking my hand.   pupils equal, round, reactive to light and accommodation III,IV, VI: ptosis not present but hold his left eye closed when looking around--denies diplopia, extra-ocular motions intact bilaterally V,VII: smile symmetric, facial light touch sensation normal bilaterally VIII: hearing normal bilaterally IX,X: uvula rises symmetrically XI: bilateral shoulder shrug XII: midline tongue extension Motor: Right : Upper extremity   5/5    Left:     Upper extremity   5/5  Lower extremity   5/5     Lower extremity   5/5 Tone and bulk:normal tone throughout; no atrophy noted Sensory: Pinprick and light touch intact throughout, bilaterally Deep Tendon Reflexes: 2+ and symmetric throughout Plantars: Right: downgoing   Left: downgoing Cerebellar: normal finger-to-nose, normal heel-to-shin test Gait: not tested.       Lab Results: Basic Metabolic Panel:  Recent Labs Lab 06/23/15 1215  06/23/15 1450 06/24/15 0338 06/25/15 0324 06/26/15 0701  NA 134*  --  133* 137 138 137  K 4.2  --  4.0 4.0 3.7 3.7  CL  --   --  101 108 110 108  CO2 20*  --  21* 20* 20* 21*  GLUCOSE 139  --  115* 95 96 111*  BUN 63.9*  --  66* 60* 49* 47*  CREATININE 4.3 Repeated and Verified*  --  4.25* 3.85* 3.84* 3.79*  CALCIUM 13.4 Repeated and Verified*  < > 12.2* 11.4* 11.4* 10.4*  < > = values in this interval not displayed.  Liver Function Tests:  Recent Labs Lab 06/23/15 1450 06/24/15 0338  AST 12* 10*  ALT 23 18  ALKPHOS 152* 118  BILITOT 0.4 0.3  PROT 7.9 6.7  ALBUMIN 1.9* 1.6*   No results for input(s): LIPASE, AMYLASE in the last 168 hours. No results for input(s): AMMONIA in the last 168 hours.  CBC:  Recent Labs Lab 06/23/15 1215 06/23/15 1450 06/24/15 0338 06/25/15 0324 06/26/15 0701  WBC 19.8* 21.2* 15.0* 14.6* 16.1*  NEUTROABS 16.8* 16.5*  --   --   --   HGB  9.6* 9.1* 8.0* 8.6* 8.5*  HCT 29.0* 27.6* 24.6* 26.6* 24.9*  MCV 93.3 93.6 95.0 95.3 93.3  PLT 785* 852* 762* 768* 648*    Cardiac Enzymes: No results for input(s): CKTOTAL, CKMB, CKMBINDEX, TROPONINI in the last 168 hours.  Lipid Panel: No results for input(s): CHOL, TRIG, HDL, CHOLHDL, VLDL, LDLCALC in the last 168 hours.  CBG: No results for input(s): GLUCAP in the last 168 hours.  Microbiology: Results for orders placed or performed during the hospital encounter of 06/23/15  Urine culture     Status: Abnormal   Collection Time: 06/23/15  2:30 PM  Result Value Ref Range  Status   Specimen Description URINE, CLEAN CATCH  Final   Special Requests NONE  Final   Culture MULTIPLE SPECIES PRESENT, SUGGEST RECOLLECTION (A)  Final   Report Status 06/25/2015 FINAL  Final  Blood Culture (routine x 2)     Status: None (Preliminary result)   Collection Time: 06/23/15  2:45 PM  Result Value Ref Range Status   Specimen Description BLOOD RIGHT ANTECUBITAL  Final   Special Requests BOTTLES DRAWN AEROBIC AND ANAEROBIC 5 CC EA  Final   Culture   Final    NO GROWTH 2 DAYS Performed at [facility]    Report Status PENDING  Incomplete  Blood Culture (routine x 2)     Status: None (Preliminary result)   Collection Time: 06/23/15  3:29 PM  Result Value Ref Range Status   Specimen Description BLOOD RIGHT HAND  Final   Special Requests BOTTLES DRAWN AEROBIC ONLY 9ML  Final   Culture   Final    NO GROWTH 2 DAYS Performed at [facility]    Report Status PENDING  Incomplete    Coagulation Studies: No results for input(s): LABPROT, INR in the last 72 hours.  Imaging: Dg Chest 2 View  06/25/2015  CLINICAL DATA:  Pt complains of Nausea, Vomiting, diarrhea, fever and chills x 4 days. Hx of multiple myeloma and on chemo. EXAM: CHEST  2 VIEW COMPARISON:  Multiple prior studies including 01/30/2014 and 2015/02/15 FINDINGS: Shallow lung inflation. The heart is mildly prominent in  size. There are small bilateral pleural effusions. Minimal streaky density identified at the lung bases, left greater than right. There is right apical extrapleural soft tissue density. Rounded soft tissue density overlies the right posterior lateral fifth rib and there is possible expansion of the right anterior fifth rib. There is a remote fracture of the right second anterior rib. IMPRESSION: 1. Interval change in the appearance of the chest. 2. Increased soft tissue masses and possible expansion of the right anterior fifth rib. 3. 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 Consider further evaluation with CT of the chest as needed. 4. Bilateral lower lobe atelectasis and/or infiltrates, left greater than right. Electronically Signed   By: Nolon Nations M.D.   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 This is typically a disorder of cerebral autoregulation which occurs often the context of hypertension. renal failure and immune suppression are also contributing factors. Decadron, cyclophosphamide, bortezomib oral associated with pres.  These have been discontinued at this time, but he was given a dose of Decadron today.  There are case reports of carfilzomib causing posterior reversible encephalopathy as well. Given the timing of this starting most recently, I do think that it could be associated.  Seizures can sometimes be associated with pres, but I would not start antiepileptic therapy unless seizures were to occur.  1) blood pressure control 2) I would hold further chemotherapeutic agents until the pres is controlled 3) we will follow  Roland Rack, MD [facility] [phone]  If 7pm- 7am, please page neurology on call as listed in [place].

## 2015-06-26 NOTE — Progress Notes (Signed)
Pt transferred from [place]. Pt is comfortable in bed. Alert and oriented x 4. Denies pain. Labetalol drip started as ordered. Pt's son currently at bedside.    06/26/15 1650  Vitals  Temp 98.3 F (36.8 C)  Temp Source Oral  BP (!) 145/90 mmHg  MAP (mmHg) 100  BP Location Left Arm  BP Method Automatic  Patient Position (if appropriate) Lying  Pulse Rate 95  Resp 16  Oxygen Therapy  SpO2 95 %  O2 Device Room Air  Pain Assessment  Pain Assessment No/denies pain  Glasgow Coma Scale  Eye Opening 4  Best Verbal Response (NON-intubated) 5  Best Motor Response 6  Glasgow Coma Scale Score 15

## 2015-06-26 NOTE — Progress Notes (Signed)
Charles [name]   DOB:1975/12/28   JJ#:[id number]   [id number]  Subjective: Charles [name] feels "a little groggy". He ambulated in halls x 2 yesterday and "I feel I can do more today." Had BM yesterday. Pain is better controlled but he required breakthrough pain and two of those were IV. Wife in room   Objective:  Filed Vitals:   06/25/15 2238 06/26/15 0702  BP: 157/105 173/98  Pulse: 125 101  Temp: 99.2 F (37.3 C) 97.9 F (36.6 C)  Resp: 20 20    Body mass index is 27.06 kg/(m^2).  Intake/Output Summary (Last 24 hours) at 06/26/15 0806 Last data filed at 06/26/15 0600  Gross per 24 hour  Intake   2640 ml  Output    350 ml  Net   2290 ml     Sclerae unicteric  No peripheral adenopathy  Lungs clear -- no rales or rhonchi  Heart regular rate and rhythm  Abdomen benign  Neuro nonfocal    CBG (last 3)  No results for input(s): GLUCAP in the last 72 hours.   Labs:  Lab Results  Component Value Date   WBC 16.1* 06/26/2015   HGB 8.5* 06/26/2015   HCT 24.9* 06/26/2015   MCV 93.3 06/26/2015   PLT 648* 06/26/2015   NEUTROABS 16.5* 06/23/2015    @LASTCHEMISTRY @  Urine Studies No results for input(s): UHGB, CRYS in the last 72 hours.  Invalid input(s): UACOL, UAPR, USPG, UPH, UTP, UGL, UKET, UBIL, UNIT, UROB, [place], UEPI, UWBC, [place], [place], [place], [place], [place]  Basic Metabolic Panel:  Recent Labs Lab 06/23/15 1215  06/23/15 1450 06/24/15 0338 06/25/15 0324 06/26/15 0701  NA 134*  --  133* 137 138 137  K 4.2  < > 4.0 4.0 3.7 3.7  CL  --   --  101 108 110 108  CO2 20*  --  21* 20* 20* 21*  GLUCOSE 139  --  115* 95 96 111*  BUN 63.9*  --  66* 60* 49* 47*  CREATININE 4.3 Repeated and Verified*  --  4.25* 3.85* 3.84* 3.79*  CALCIUM 13.4 Repeated and Verified*  --  12.2* 11.4* 11.4* 10.4*  < > = values in this interval not displayed. GFR Estimated Creatinine Clearance: 21.8 mL/min (by C-G formula based on Cr of 3.79). Liver Function Tests:  Recent Labs Lab  06/23/15 1450 06/24/15 0338  AST 12* 10*  ALT 23 18  ALKPHOS 152* 118  BILITOT 0.4 0.3  PROT 7.9 6.7  ALBUMIN 1.9* 1.6*   No results for input(s): LIPASE, AMYLASE in the last 168 hours. No results for input(s): AMMONIA in the last 168 hours. Coagulation profile No results for input(s): INR, PROTIME in the last 168 hours.  CBC:  Recent Labs Lab 06/23/15 1215 06/23/15 1450 06/24/15 0338 06/25/15 0324 06/26/15 0701  WBC 19.8* 21.2* 15.0* 14.6* 16.1*  NEUTROABS 16.8* 16.5*  --   --   --   HGB 9.6* 9.1* 8.0* 8.6* 8.5*  HCT 29.0* 27.6* 24.6* 26.6* 24.9*  MCV 93.3 93.6 95.0 95.3 93.3  PLT 785* 852* 762* 768* 648*   Cardiac Enzymes: No results for input(s): CKTOTAL, CKMB, CKMBINDEX, TROPONINI in the last 168 hours. BNP: Invalid input(s): POCBNP CBG: No results for input(s): GLUCAP in the last 168 hours. D-Dimer No results for input(s): DDIMER in the last 72 hours. Hgb A1c No results for input(s): HGBA1C in the last 72 hours. Lipid Profile No results for input(s): CHOL, HDL, LDLCALC, TRIG, CHOLHDL, LDLDIRECT in the last 72  hours. Thyroid function studies No results for input(s): TSH, T4TOTAL, T3FREE, THYROIDAB in the last 72 hours.  Invalid input(s): FREET3 Anemia work up No results for input(s): VITAMINB12, FOLATE, FERRITIN, TIBC, IRON, RETICCTPCT in the last 72 hours. Microbiology Recent Results (from the past 240 hour(s))  Urine culture     Status: Abnormal   Collection Time: 06/23/15  2:30 PM  Result Value Ref Range Status   Specimen Description URINE, CLEAN CATCH  Final   Special Requests NONE  Final   Culture MULTIPLE SPECIES PRESENT, SUGGEST RECOLLECTION (A)  Final   Report Status 06/25/2015 FINAL  Final  Blood Culture (routine x 2)     Status: None (Preliminary result)   Collection Time: 06/23/15  2:45 PM  Result Value Ref Range Status   Specimen Description BLOOD RIGHT ANTECUBITAL  Final   Special Requests BOTTLES DRAWN AEROBIC AND ANAEROBIC 5 CC EA   Final   Culture   Final    NO GROWTH 2 DAYS Performed at [facility]    Report Status PENDING  Incomplete  Blood Culture (routine x 2)     Status: None (Preliminary result)   Collection Time: 06/23/15  3:29 PM  Result Value Ref Range Status   Specimen Description BLOOD RIGHT HAND  Final   Special Requests BOTTLES DRAWN AEROBIC ONLY 9ML  Final   Culture   Final    NO GROWTH 2 DAYS Performed at [facility]    Report Status PENDING  Incomplete      Studies:  Dg Chest 2 View  06/25/2015  CLINICAL DATA:  Pt complains of Nausea, Vomiting, diarrhea, fever and chills x 4 days. Hx of multiple myeloma and on chemo. EXAM: CHEST  2 VIEW COMPARISON:  Multiple prior studies including 01/30/2014 and 2015-02-01 FINDINGS: Shallow lung inflation. The heart is mildly prominent in size. There are small bilateral pleural effusions. Minimal streaky density identified at the lung bases, left greater than right. There is right apical extrapleural soft tissue density. Rounded soft tissue density overlies the right posterior lateral fifth rib and there is possible expansion of the right anterior fifth rib. There is a remote fracture of the right second anterior rib. IMPRESSION: 1. Interval change in the appearance of the chest. 2. Increased soft tissue masses and possible expansion of the right anterior fifth rib. 3. 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 Consider further evaluation with CT of the chest as needed. 4. Bilateral lower lobe atelectasis and/or infiltrates, left greater than right. Electronically Signed   By: Nolon Nations M.D.   On: 06/25/2015 15:58   US Renal Port  06/24/2015  CLINICAL DATA:  Acute kidney injury. EXAM: RENAL / URINARY TRACT ULTRASOUND COMPLETE COMPARISON:  CT scan of Jun 23, 2015. FINDINGS: Right Kidney: Length: 12.1 cm. Mildly increased echogenicity of renal parenchyma is noted. No mass or hydronephrosis visualized. Left Kidney: Length: 11.9 cm. Mildly increased  echogenicity of renal parenchyma is noted. 1.6 cm simple exophytic cyst is seen arising from midpole. No mass or hydronephrosis visualized. Bladder: Appears normal for degree of bladder distention. IMPRESSION: Mildly increased echogenicity of renal parenchyma is noted bilaterally suggesting medical renal disease. No hydronephrosis or renal obstruction is noted. Electronically Signed   By: Marijo Conception, M.D.   On: 06/24/2015 09:50    Assessment: 40 y.o. 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, with some sheets of abnormal plasma cells noted, cytogenetics pending; baseline beta 2 microglobulin was 7.36, baseline creatinine 1.64, with GFR 51, calcium on general 05/21/2014 was 10.8, LDH was normal  (1) Multiple myeloma stage IIA diagnosed January 2016;   I: CyBorD started January 2016 (a) dexamethasone 20 mg/d every TU/WED started 02/04/2014 (b) bortezomib sQ days 1,4,8,11 of every 21 day cycle started 02/10/2014 (c) cyclophosphamide 300 mg/M2 IV weekly started 02/13/2014   2: Lenalidomide 66m daily started 07/26/2014, discontinued December 2016 with progression  (2) chronic kidney disease stage III  (3) hypercalcemia: zolendronate started 02/10/2014-- repeat every 4 weeks initially, then every 12 weeks (a) changed to denosumab/Xgeva starting 02/26/2015 because of CKD, repeated every 12 weeks  (4) ID prophylaxis: (a) influenza and Pneumovax [PV13] vaccines 02/07/2014 (b) acyclovir started 02/07/2014 (c) HIV negative 01/31/2014 (d) Pneumovax P23 too be given after March 2016  (5). The patient is not a transplant candidate for financial reasons  (6) bilateral foot rash: Started on Septra DS and Diflucan  09/03/2014, resolved  (7) Multiple Myeloma relapse in December 2016.  (a) starting 02/05/15: dexamethasone 232mx2 consecutive days weekly; bortezomib sQ weekly,  (b) Septra DS twice daily (c) acyclovir 4003maily  (d) bortezomib/ dexamethasone discontinued after 05/06/2015 dose with evidence of progression  (8) started carfilzomib 05/28/2015, given with weekly low dose dexamethasone (a) tolerated cyclophosphamide poorly, discontinued after cycle 1 (b) carfilzomib started 05/28/2015, first cycle only 4 doses (c) taking dexamethasone 20 mg daily 2 days a week, not on treatment days)   (9) lenalidomide 10 mg po daily addded 06/26/2015, to be taken 21 days on, 7 days off  (10) uncontrolled pain: currently on MSContin 15 mg BID with hydrocodone/APAP for breakthrough pain  (a) bowel prophylaxis with miraLAX, docusate  Plan:  Auden's pain is better controlled. I am going to stop the IV narcotics so we can more easily titrate his oral meds. If he requires more than 4 tablets hydrocodone/APAP daily wold increase MSContin to 30 BID at discharge.   Also discussed bowel prophylaxis with patient and his wife.  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 We can consider radiation to these but hopefully systemic treatment will reduce them. No need for CT at this point.  The Ca++ is finally coming down. He understands he needs to drink 2-3 quarts/ fluid daily at home. We will resume Xgeva when he returns to the cancer center 05/30 for his chemotherapy with carfilzomib.  He should continue his current supply of lenalidomide 10 mg daily until it runs out. We will rewrite as appropriat after discharge. Also will write for his dexamethasone today and tomorrow.  Note that [facility] an [facility] all [facility] through them.  I expect he will be  ready for discharge in the next 24 hours. Please let us [place] if we can be of further help. Will sign off at this point.   MAGChauncey CruelD 06/26/2015  8:06 AM Medical Oncology and Hematology [facility] [id number]. [phone] Fax. [phone]

## 2015-06-26 NOTE — Progress Notes (Signed)
ADDENDUM: I have asked XRT to evaluate the patient for possible palliative treatment-- this can be done as outpatient.

## 2015-06-26 NOTE — Progress Notes (Signed)
PROGRESS NOTE    [name]  QZE:[id number] DOB: 1975-09-09 DOA: 06/23/2015 PCP: [name], MD   Brief Narrative: Charles [name] is a 40 y.o. male with medical history significant of Multiple Myeloma, CKD 3, back pain, currently undergoing chemotherapy, reports he was started on a new chemotherapeutic agent recently, last chemo was last week, went to the cancer center with Nausea, Vomiting, diarrhea, fever and chills x 4days and was sent to the ER. ED Course: Temp of 99.1, labs notable for creatinine of 4.2 up from 3.3 10 days ago   Assessment & Plan:   Principal Problem:   AKI (acute kidney injury) ([place]) Active Problems:   Multiple myeloma (HCC)   CKD (chronic kidney disease) stage 3, GFR 30-59 ml/min   Nausea with vomiting  HTN Emergency, Pres syndrome;  Patient report vision changes this am.  MRI consistent with Pres.  Appreciate Neurology evaluation.  Try to decrease BP by 20 % in 2 to 5 hours.  Discussed with Neurology BP goal 130 to 140.  Started Norvasc.  IV labetalol Gtt as HR allows it, if HR decrease could try nicardipine Gtt. Avoid nitroprusside due to renal failure.  Transfer to step down for IV GTT   AKI (acute kidney injury) ([place]) -on CKD 3, baseline is 3.3 -due to GI losses and poor Po intake -hydrate, monitor urine out put.  - [place] negative for hydronephrosis.  -Good urine out put  -NSL fluids to avoid overload.    Multiple myeloma (HCC) -on chemo, followed by Dr.Magrinat, = -elevated WBC could be due to hemoconcentration, decadron with chemo +/- infection -Appreciate Dr Jana Hakim Helps.  -pain not well controlled. Started on MS-contin.  -discussed with Dr Jana Hakim, ok to hold decadron and revlimid.   UTI;  Abnormal UA, some symptoms with urination reported -continue with  IV cefepime, FU cultures, monitor Urine multiples bacteria, will send recollection.    Nausea with vomiting -could be from chemo, ? Infection -supportive  care, hydrate, Abx, monitor -improved. Advance diet as tolerated.  Improved.   PNA, Presume UTI.  Fevers; chest x ray consistent with PNA. On vancomycin and cefepime   DVT prophylaxis; Lovenox.  Code Status: full code.  Family Communication: care discussed with wife.  Disposition Plan: remain inpatient, home tolerates diet, and renal function improved.    Consultants:   Dr Jana Hakim.   Procedures;   Renal US; medical renal disease, no hydronephrosis.   Antimicrobials:  Ceftriaxone 5-24   Subjective: Patient report vision loss, blurry vision since last night.  He them says , that vision loss started on Tuesday , 3 days ago. He never mention anything. He notice worsening vision loss since last night    Objective: Filed Vitals:   06/25/15 1425 06/25/15 1907 06/25/15 2238 06/26/15 0702  BP: 182/102 165/109 157/105 173/98  Pulse: 128 118 125 101  Temp: 100.4 F (38 C) 99.3 F (37.4 C) 99.2 F (37.3 C) 97.9 F (36.6 C)  TempSrc: Oral Oral Oral Oral  Resp: 24 20 20 20   Height:      Weight:      SpO2: 97% 95% 95% 97%    Intake/Output Summary (Last 24 hours) at 06/26/15 0936 Last data filed at 06/26/15 0600  Gross per 24 hour  Intake   2640 ml  Output    200 ml  Net   2440 ml   Filed Weights   06/23/15 1430 06/23/15 1433  Weight: 67.132 kg (148 lb) 67.132 kg (148 lb)  Examination:  General exam: Appears calm and comfortable  Respiratory system: Clear to auscultation. Respiratory effort normal. Cardiovascular system: S1 & S2 heard, RRR. No JVD, murmurs, rubs, gallops or clicks. No pedal edema. Gastrointestinal system: Abdomen is nondistended, soft and nontender. No organomegaly or masses felt. Normal bowel sounds heard. Central nervous system: Alert and oriented. Motor strength 5/5, bilateral vision loss  Extremities: Symmetric 5 x 5 power. Skin: No rashes, lesions or ulcers    Data Reviewed: I have personally reviewed following labs and imaging  studies  CBC:  Recent Labs Lab 06/23/15 1215 06/23/15 1450 06/24/15 0338 06/25/15 0324 06/26/15 0701  WBC 19.8* 21.2* 15.0* 14.6* 16.1*  NEUTROABS 16.8* 16.5*  --   --   --   HGB 9.6* 9.1* 8.0* 8.6* 8.5*  HCT 29.0* 27.6* 24.6* 26.6* 24.9*  MCV 93.3 93.6 95.0 95.3 93.3  PLT 785* 852* 762* 768* 759*   Basic Metabolic Panel:  Recent Labs Lab 06/23/15 1215 06/23/15 1450 06/24/15 0338 06/25/15 0324 06/26/15 0701  NA 134* 133* 137 138 137  K 4.2 4.0 4.0 3.7 3.7  CL  --  101 108 110 108  CO2 20* 21* 20* 20* 21*  GLUCOSE 139 115* 95 96 111*  BUN 63.9* 66* 60* 49* 47*  CREATININE 4.3 Repeated and Verified* 4.25* 3.85* 3.84* 3.79*  CALCIUM 13.4 Repeated and Verified* 12.2* 11.4* 11.4* 10.4*   GFR: Estimated Creatinine Clearance: 21.8 mL/min (by C-G formula based on Cr of 3.79). Liver Function Tests:  Recent Labs Lab 06/23/15 1450 06/24/15 0338  AST 12* 10*  ALT 23 18  ALKPHOS 152* 118  BILITOT 0.4 0.3  PROT 7.9 6.7  ALBUMIN 1.9* 1.6*   No results for input(s): LIPASE, AMYLASE in the last 168 hours. No results for input(s): AMMONIA in the last 168 hours. Coagulation Profile: No results for input(s): INR, PROTIME in the last 168 hours. Cardiac Enzymes: No results for input(s): CKTOTAL, CKMB, CKMBINDEX, TROPONINI in the last 168 hours. BNP (last 3 results) No results for input(s): PROBNP in the last 8760 hours. HbA1C: No results for input(s): HGBA1C in the last 72 hours. CBG: No results for input(s): GLUCAP in the last 168 hours. Lipid Profile: No results for input(s): CHOL, HDL, LDLCALC, TRIG, CHOLHDL, LDLDIRECT in the last 72 hours. Thyroid Function Tests: No results for input(s): TSH, T4TOTAL, FREET4, T3FREE, THYROIDAB in the last 72 hours. Anemia Panel: No results for input(s): VITAMINB12, FOLATE, FERRITIN, TIBC, IRON, RETICCTPCT in the last 72 hours. Sepsis Labs:  Recent Labs Lab 06/23/15 1507  LATICACIDVEN 0.78    Recent Results (from the past 240  hour(s))  Urine culture     Status: Abnormal   Collection Time: 06/23/15  2:30 PM  Result Value Ref Range Status   Specimen Description URINE, CLEAN CATCH  Final   Special Requests NONE  Final   Culture MULTIPLE SPECIES PRESENT, SUGGEST RECOLLECTION (A)  Final   Report Status 06/25/2015 FINAL  Final  Blood Culture (routine x 2)     Status: None (Preliminary result)   Collection Time: 06/23/15  2:45 PM  Result Value Ref Range Status   Specimen Description BLOOD RIGHT ANTECUBITAL  Final   Special Requests BOTTLES DRAWN AEROBIC AND ANAEROBIC 5 CC EA  Final   Culture   Final    NO GROWTH 2 DAYS Performed at [facility]    Report Status PENDING  Incomplete  Blood Culture (routine x 2)     Status: None (Preliminary result)  Collection Time: 06/23/15  3:29 PM  Result Value Ref Range Status   Specimen Description BLOOD RIGHT HAND  Final   Special Requests BOTTLES DRAWN AEROBIC ONLY 9ML  Final   Culture   Final    NO GROWTH 2 DAYS Performed at [facility]    Report Status PENDING  Incomplete         Radiology Studies: Dg Chest 2 View  06/25/2015  CLINICAL DATA:  Pt complains of Nausea, Vomiting, diarrhea, fever and chills x 4 days. Hx of multiple myeloma and on chemo. EXAM: CHEST  2 VIEW COMPARISON:  Multiple prior studies including 01/30/2014 and 30-Jan-2015 FINDINGS: Shallow lung inflation. The heart is mildly prominent in size. There are small bilateral pleural effusions. Minimal streaky density identified at the lung bases, left greater than right. There is right apical extrapleural soft tissue density. Rounded soft tissue density overlies the right posterior lateral fifth rib and there is possible expansion of the right anterior fifth rib. There is a remote fracture of the right second anterior rib. IMPRESSION: 1. Interval change in the appearance of the chest. 2. Increased soft tissue masses and possible expansion of the right anterior fifth rib. 3. Findings may  indicate plasmacytoma. Consider further evaluation with CT of the chest as needed. 4. Bilateral lower lobe atelectasis and/or infiltrates, left greater than right. Electronically Signed   By: Nolon Nations M.D.   On: 06/25/2015 15:58   US Renal Port  06/24/2015  CLINICAL DATA:  Acute kidney injury. EXAM: RENAL / URINARY TRACT ULTRASOUND COMPLETE COMPARISON:  CT scan of Jun 23, 2015. FINDINGS: Right Kidney: Length: 12.1 cm. Mildly increased echogenicity of renal parenchyma is noted. No mass or hydronephrosis visualized. Left Kidney: Length: 11.9 cm. Mildly increased echogenicity of renal parenchyma is noted. 1.6 cm simple exophytic cyst is seen arising from midpole. No mass or hydronephrosis visualized. Bladder: Appears normal for degree of bladder distention. IMPRESSION: Mildly increased echogenicity of renal parenchyma is noted bilaterally suggesting medical renal disease. No hydronephrosis or renal obstruction is noted. Electronically Signed   By: Marijo Conception, M.D.   On: 06/24/2015 09:50        Scheduled Meds: . sodium chloride  250 mL Intravenous Once  . acyclovir  400 mg Oral Daily  . ceFEPime (MAXIPIME) IV  1 g Intravenous Q24H  . dexamethasone  20 mg Oral Daily  . docusate sodium  100 mg Oral Daily  . enoxaparin (LOVENOX) injection  30 mg Subcutaneous Q24H  . feeding supplement (ENSURE ENLIVE)  237 mL Oral TID BM  . lenalidomide  10 mg Oral Daily  . mirtazapine  15 mg Oral QHS  . morphine  15 mg Oral Q12H  . polyethylene glycol  17 g Oral Daily  . vancomycin  1,000 mg Intravenous Q48H   Continuous Infusions: . sodium chloride 100 mL/hr at 06/26/15 0421     LOS: 3 days    Time spent: 25 minutes.     Elmarie Shiley, MD [facility] Pager [phone]  If 7PM-7AM, please contact night-coverage [email or web address] Password [facility] 06/26/2015, 9:36 AM

## 2015-06-27 DIAGNOSIS — I10 Essential (primary) hypertension: Secondary | ICD-10-CM

## 2015-06-27 LAB — CBC WITH DIFFERENTIAL/PLATELET
BASOS ABS: 0 10*3/uL (ref 0.0–0.1)
BASOS PCT: 0 %
EOS ABS: 0 10*3/uL (ref 0.0–0.7)
Eosinophils Relative: 0 %
HEMATOCRIT: 23 % — AB (ref 39.0–52.0)
HEMOGLOBIN: 7.4 g/dL — AB (ref 13.0–17.0)
LYMPHS PCT: 8 %
Lymphs Abs: 1 10*3/uL (ref 0.7–4.0)
MCH: 30.2 pg (ref 26.0–34.0)
MCHC: 32.2 g/dL (ref 30.0–36.0)
MCV: 93.9 fL (ref 78.0–100.0)
MONOS PCT: 4 %
Monocytes Absolute: 0.5 10*3/uL (ref 0.1–1.0)
NEUTROS PCT: 88 %
Neutro Abs: 10.5 10*3/uL — ABNORMAL HIGH (ref 1.7–7.7)
Platelets: 632 10*3/uL — ABNORMAL HIGH (ref 150–400)
RBC: 2.45 MIL/uL — ABNORMAL LOW (ref 4.22–5.81)
RDW: 14.5 % (ref 11.5–15.5)
WBC: 12 10*3/uL — ABNORMAL HIGH (ref 4.0–10.5)

## 2015-06-27 LAB — COMPREHENSIVE METABOLIC PANEL
ALBUMIN: 1.6 g/dL — AB (ref 3.5–5.0)
ALK PHOS: 111 U/L (ref 38–126)
ALT: 11 U/L — AB (ref 17–63)
AST: 10 U/L — ABNORMAL LOW (ref 15–41)
Anion gap: 10 (ref 5–15)
BILIRUBIN TOTAL: 0.7 mg/dL (ref 0.3–1.2)
BUN: 53 mg/dL — ABNORMAL HIGH (ref 6–20)
CALCIUM: 9.4 mg/dL (ref 8.9–10.3)
CO2: 19 mmol/L — AB (ref 22–32)
CREATININE: 3.73 mg/dL — AB (ref 0.61–1.24)
Chloride: 106 mmol/L (ref 101–111)
GFR calc Af Amer: 22 mL/min — ABNORMAL LOW (ref >60)
GFR calc non Af Amer: 19 mL/min — ABNORMAL LOW (ref >60)
GLUCOSE: 194 mg/dL — AB (ref 65–99)
Potassium: 3.5 mmol/L (ref 3.5–5.1)
SODIUM: 135 mmol/L (ref 135–145)
Total Protein: 7 g/dL (ref 6.5–8.1)

## 2015-06-27 LAB — URINE CULTURE: CULTURE: NO GROWTH

## 2015-06-27 MED ORDER — HYDRALAZINE HCL 20 MG/ML IJ SOLN
10.0000 mg | Freq: Four times a day (QID) | INTRAMUSCULAR | Status: DC | PRN
Start: 2015-06-27 — End: 2015-06-30
  Administered 2015-06-27 – 2015-06-29 (×4): 10 mg via INTRAVENOUS
  Filled 2015-06-27 (×4): qty 1

## 2015-06-27 MED ORDER — HYDRALAZINE HCL 25 MG PO TABS: 25.0000 mg | ORAL_TABLET | Freq: Three times a day (TID) | ORAL | Status: DC

## 2015-06-27 MED ORDER — ACYCLOVIR 400 MG PO TABS
400.0000 mg | ORAL_TABLET | Freq: Every day | ORAL | Status: DC
  Administered 2015-06-27 – 2015-07-02 (×6): 400 mg via ORAL
  Filled 2015-06-27 (×9): qty 1

## 2015-06-27 MED ORDER — METOPROLOL TARTRATE 25 MG PO TABS
50.0000 mg | ORAL_TABLET | Freq: Two times a day (BID) | ORAL | Status: DC
  Administered 2015-06-27 – 2015-06-28 (×4): 50 mg via ORAL
  Filled 2015-06-27 (×5): qty 2

## 2015-06-27 MED ORDER — HYDRALAZINE HCL 25 MG PO TABS
25.0000 mg | ORAL_TABLET | Freq: Three times a day (TID) | ORAL | Status: DC
  Administered 2015-06-27 – 2015-06-28 (×3): 25 mg via ORAL
  Filled 2015-06-27 (×3): qty 1

## 2015-06-27 NOTE — Progress Notes (Signed)
PROGRESS NOTE    Charles [name]  RFX:[id number] DOB: March 24, 1975 DOA: 06/23/2015 PCP: [name], MD   Brief Narrative: Charles [name] is a 40 y.o. male with medical history significant of Multiple Myeloma, CKD 3, back pain, currently undergoing chemotherapy, reports he was started on a new chemotherapeutic agent recently, last chemo was last week, went to the cancer center with Nausea, Vomiting, diarrhea, fever and chills x 4days and was sent to the ER. ED Course: Temp of 99.1, labs notable for creatinine of 4.2 up from 3.3 10 days ago   Assessment & Plan:   Principal Problem:   AKI (acute kidney injury) ([place]) Active Problems:   Multiple myeloma (HCC)   CKD (chronic kidney disease) stage 3, GFR 30-59 ml/min   Nausea with vomiting   Blurry vision, bilateral   HTN (hypertension), malignant  HTN Emergency, Pres syndrome;  Patient report vision changes the morning 5-26 MRI consistent with Pres.  Appreciate Neurology evaluation.  Discussed with Neurology BP goal 130 to 140.  Started Norvasc, metoprolol/  BP was controlled overnight without IV labetalol. BP elevated this am. Will received norvasc, metoprolol, if no controlled might need to resume Labetalol Gtt. Also could start Oral hydralazine.   AKI (acute kidney injury) ([place]) -on CKD 3, baseline is 3.3 -due to GI losses and poor Po intake -hydrate, monitor urine out put.  - [place] negative for hydronephrosis.  -NSL fluids to avoid overload.    Multiple myeloma (HCC) -on chemo, followed by Dr.Magrinat, = -elevated WBC could be due to hemoconcentration, decadron with chemo +/- infection -Appreciate Dr Jana Hakim Helps.  -Continue with MS-contin.  -discussed with Dr Jana Hakim, ok to hold decadron and revlimid.  -WBC trending down.    UTI;  Abnormal UA, some symptoms with urination reported -continue with  IV cefepime, FU cultures, monitor Urine multiples bacteria, will send recollection.    Nausea with  vomiting -could be from chemo, ? Infection -supportive care, hydrate, Abx, monitor -improved. Advance diet as tolerated.  Improved.   PNA, Presume UTI.  Fevers; chest x ray consistent with PNA. On vancomycin and cefepime Day 3 antibiotics.   Anemia; secondary to MM;.  Follow Hb trend, might need Blood transfusion.   DVT prophylaxis; Lovenox.  Code Status: full code.  Family Communication: care discussed with wife.  Disposition Plan: remain in the step down.    Consultants:   Dr Jana Hakim.   Procedures;   Renal US; medical renal disease, no hydronephrosis.   Antimicrobials:  Ceftriaxone 5-24--cefepime 5-25  Vancomycin 5-25   Subjective: Still mildly confuse. Report that vision has improved, he was able to see a cup, recognized colors,.///    Objective: Filed Vitals:   06/27/15 0403 06/27/15 0500 06/27/15 0600 06/27/15 0640  BP: 125/70 130/72 144/84 141/83  Pulse: 83 87 92 90  Temp: 98.4 F (36.9 C)     TempSrc: Oral     Resp: _0 Height:      Weight:      SpO2: 95% 95% 94% 95%    Intake/Output Summary (Last 24 hours) at 06/27/15 0713 Last data filed at 06/26/15 1809  Gross per 24 hour  Intake 349.63 ml  Output      0 ml  Net 349.63 ml   Filed Weights   06/23/15 1430 06/23/15 1433 06/26/15 1655  Weight: 67.132 kg (148 lb) 67.132 kg (148 lb) 64 kg (141 lb 1.5 oz)    Examination:  General exam: Appears calm and comfortable  Respiratory  system: Clear to auscultation. Respiratory effort normal. Cardiovascular system: S1 & S2 heard, RRR. No JVD, murmurs, rubs, gallops or clicks. No pedal edema. Gastrointestinal system: Abdomen is nondistended, soft and nontender. No organomegaly or masses felt. Normal bowel sounds heard. Central nervous system: Alert . Motor strength 5/5, vision improving  Extremities: Symmetric 5 x 5 power. Skin: No rashes, lesions or ulcers    Data Reviewed: I have personally reviewed following labs and imaging  studies  CBC:  Recent Labs Lab 06/23/15 1215 06/23/15 1450 06/24/15 0338 06/25/15 0324 06/26/15 0701 06/27/15 0328  WBC 19.8* 21.2* 15.0* 14.6* 16.1* 12.0*  NEUTROABS 16.8* 16.5*  --   --   --  10.5*  HGB 9.6* 9.1* 8.0* 8.6* 8.5* 7.4*  HCT 29.0* 27.6* 24.6* 26.6* 24.9* 23.0*  MCV 93.3 93.6 95.0 95.3 93.3 93.9  PLT 785* 852* 762* 768* 648* 338*   Basic Metabolic Panel:  Recent Labs Lab 06/23/15 1450 06/24/15 0338 06/25/15 0324 06/26/15 0701 06/27/15 0328  NA 133* 137 138 137 135  K 4.0 4.0 3.7 3.7 3.5  CL 101 108 110 108 106  CO2 21* 20* 20* 21* 19*  GLUCOSE 115* 95 96 111* 194*  BUN 66* 60* 49* 47* 53*  CREATININE 4.25* 3.85* 3.84* 3.79* 3.73*  CALCIUM 12.2* 11.4* 11.4* 10.4* 9.4   GFR: Estimated Creatinine Clearance: 20.3 mL/min (by C-G formula based on Cr of 3.73). Liver Function Tests:  Recent Labs Lab 06/23/15 1450 06/24/15 0338 06/27/15 0328  AST 12* 10* 10*  ALT 23 18 11*  ALKPHOS 152* 118 111  BILITOT 0.4 0.3 0.7  PROT 7.9 6.7 7.0  ALBUMIN 1.9* 1.6* 1.6*   No results for input(s): LIPASE, AMYLASE in the last 168 hours. No results for input(s): AMMONIA in the last 168 hours. Coagulation Profile: No results for input(s): INR, PROTIME in the last 168 hours. Cardiac Enzymes: No results for input(s): CKTOTAL, CKMB, CKMBINDEX, TROPONINI in the last 168 hours. BNP (last 3 results) No results for input(s): PROBNP in the last 8760 hours. HbA1C: No results for input(s): HGBA1C in the last 72 hours. CBG: No results for input(s): GLUCAP in the last 168 hours. Lipid Profile: No results for input(s): CHOL, HDL, LDLCALC, TRIG, CHOLHDL, LDLDIRECT in the last 72 hours. Thyroid Function Tests: No results for input(s): TSH, T4TOTAL, FREET4, T3FREE, THYROIDAB in the last 72 hours. Anemia Panel: No results for input(s): VITAMINB12, FOLATE, FERRITIN, TIBC, IRON, RETICCTPCT in the last 72 hours. Sepsis Labs:  Recent Labs Lab 06/23/15 1507  LATICACIDVEN 0.78     Recent Results (from the past 240 hour(s))  Urine culture     Status: Abnormal   Collection Time: 06/23/15  2:30 PM  Result Value Ref Range Status   Specimen Description URINE, CLEAN CATCH  Final   Special Requests NONE  Final   Culture MULTIPLE SPECIES PRESENT, SUGGEST RECOLLECTION (A)  Final   Report Status 06/25/2015 FINAL  Final  Blood Culture (routine x 2)     Status: None (Preliminary result)   Collection Time: 06/23/15  2:45 PM  Result Value Ref Range Status   Specimen Description BLOOD RIGHT ANTECUBITAL  Final   Special Requests BOTTLES DRAWN AEROBIC AND ANAEROBIC 5 CC EA  Final   Culture   Final    NO GROWTH 3 DAYS Performed at [facility]    Report Status PENDING  Incomplete  Blood Culture (routine x 2)     Status: None (Preliminary result)   Collection Time: 06/23/15  3:29 PM  Result Value Ref Range Status   Specimen Description BLOOD RIGHT HAND  Final   Special Requests BOTTLES DRAWN AEROBIC ONLY 9ML  Final   Culture   Final    NO GROWTH 3 DAYS Performed at [facility]    Report Status PENDING  Incomplete  Culture, blood (routine x 2) Call MD if unable to obtain prior to antibiotics being given     Status: None (Preliminary result)   Collection Time: 06/25/15  5:05 PM  Result Value Ref Range Status   Specimen Description BLOOD RIGHT ARM  Final   Special Requests BOTTLES DRAWN AEROBIC AND ANAEROBIC 8 CC EA  Final   Culture   Final    NO GROWTH < 24 HOURS Performed at [facility]    Report Status PENDING  Incomplete  Culture, blood (routine x 2) Call MD if unable to obtain prior to antibiotics being given     Status: None (Preliminary result)   Collection Time: 06/25/15  5:08 PM  Result Value Ref Range Status   Specimen Description BLOOD RIGHT HAND  Final   Special Requests BOTTLES DRAWN AEROBIC AND ANAEROBIC 10 CC EA  Final   Culture   Final    NO GROWTH < 24 HOURS Performed at [facility]    Report Status PENDING   Incomplete  MRSA PCR Screening     Status: None   Collection Time: 06/26/15  5:00 PM  Result Value Ref Range Status   MRSA by PCR NEGATIVE NEGATIVE Final    Comment:        The GeneXpert MRSA Assay (FDA approved for NASAL specimens only), is one component of a comprehensive MRSA colonization surveillance program. It is not intended to diagnose MRSA infection nor to guide or monitor treatment for MRSA infections.          Radiology Studies: Dg Chest 2 View  06/25/2015  CLINICAL DATA:  Pt complains of Nausea, Vomiting, diarrhea, fever and chills x 4 days. Hx of multiple myeloma and on chemo. EXAM: CHEST  2 VIEW COMPARISON:  Multiple prior studies including 01/30/2014 and 2015/02/15 FINDINGS: Shallow lung inflation. The heart is mildly prominent in size. There are small bilateral pleural effusions. Minimal streaky density identified at the lung bases, left greater than right. There is right apical extrapleural soft tissue density. Rounded soft tissue density overlies the right posterior lateral fifth rib and there is possible expansion of the right anterior fifth rib. There is a remote fracture of the right second anterior rib. IMPRESSION: 1. Interval change in the appearance of the chest. 2. Increased soft tissue masses and possible expansion of the right anterior fifth rib. 3. 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 Consider further evaluation with CT of the chest as needed. 4. Bilateral lower lobe atelectasis and/or infiltrates, left greater than right. Electronically Signed   By: Nolon Nations M.D.   On: 06/25/2015 15:58   Mr Brain Wo Contrast  06/26/2015  CLINICAL DATA:  Bilateral blurry vision. Fever. History of multiple myeloma on chemotherapy. Elevated blood pressures. EXAM: MRI HEAD WITHOUT CONTRAST TECHNIQUE: Multiplanar, multiecho pulse sequences of the brain and surrounding structures were obtained without intravenous contrast. COMPARISON:  Head CT 10/11/2010 FINDINGS: There is  abnormal T2 hyperintensity involving cortex and subcortical white matter in both occipital lobes and left greater than right parietal lobes. No restricted diffusion or hemorrhage is seen. The brain is otherwise normal in signal aside from scattered, punctate foci of T2 hyperintensity in the  cerebral white matter which are nonspecific. No mass, midline shift, or extra-axial fluid collection is seen. Ventricles are normal in size. Orbits are unremarkable. A small amount of fluid is present in the right maxillary sinus. The mastoid air cells are clear. Major intracranial vascular flow voids are preserved. There is a 1.4 cm left frontal skull lesion with extension through the inner and outer tables of the skull and small volume epidural tumor without significant mass effect on the underlying brain. No brain edema. Multiple other, smaller lesions are scattered elsewhere in the skull. Baseline bone marrow signal is diffusely diminished throughout the skull and visualized upper cervical spine and may be related to ongoing treatment. IMPRESSION: 1. 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 2. Multiple skull lesions consistent with known multiple myeloma. 1.4 cm left frontal lesion with mild epidural extension. Electronically Signed   By: Logan Bores M.D.   On: 06/26/2015 12:22        Scheduled Meds: . sodium chloride  250 mL Intravenous Once  . acyclovir  400 mg Oral Daily  . amLODipine  10 mg Oral Daily  . ceFEPime (MAXIPIME) IV  1 g Intravenous Q24H  . docusate sodium  100 mg Oral Daily  . enoxaparin (LOVENOX) injection  30 mg Subcutaneous Q24H  . feeding supplement (ENSURE ENLIVE)  237 mL Oral TID BM  . metoprolol tartrate  50 mg Oral BID  . mirtazapine  15 mg Oral QHS  . morphine  15 mg Oral Q12H  . polyethylene glycol  17 g Oral Daily  . vancomycin  1,000 mg Intravenous Q48H   Continuous Infusions: . labetalol (NORMODYNE) infusion Stopped (06/26/15 1809)      LOS: 4 days    Time spent: 25 minutes.     Elmarie Shiley, MD [facility] Pager [phone]  If 7PM-7AM, please contact night-coverage [email or web address] Password [facility] 06/27/2015, 7:13 AM

## 2015-06-28 LAB — CULTURE, BLOOD (ROUTINE X 2)
CULTURE: NO GROWTH
CULTURE: NO GROWTH

## 2015-06-28 LAB — CBC WITH DIFFERENTIAL/PLATELET
BASOS ABS: 0 10*3/uL (ref 0.0–0.1)
Basophils Relative: 0 %
EOS PCT: 0 %
Eosinophils Absolute: 0 10*3/uL (ref 0.0–0.7)
HEMATOCRIT: 22.1 % — AB (ref 39.0–52.0)
HEMOGLOBIN: 7.2 g/dL — AB (ref 13.0–17.0)
LYMPHS ABS: 2.1 10*3/uL (ref 0.7–4.0)
LYMPHS PCT: 11 %
MCH: 30.9 pg (ref 26.0–34.0)
MCHC: 32.6 g/dL (ref 30.0–36.0)
MCV: 94.8 fL (ref 78.0–100.0)
MONOS PCT: 8 %
Monocytes Absolute: 1.5 10*3/uL — ABNORMAL HIGH (ref 0.1–1.0)
NEUTROS ABS: 15.7 10*3/uL — AB (ref 1.7–7.7)
Neutrophils Relative %: 81 %
Platelets: 557 10*3/uL — ABNORMAL HIGH (ref 150–400)
RBC: 2.33 MIL/uL — AB (ref 4.22–5.81)
RDW: 14.7 % (ref 11.5–15.5)
WBC Morphology: INCREASED
WBC: 19.3 10*3/uL — AB (ref 4.0–10.5)

## 2015-06-28 LAB — COMPREHENSIVE METABOLIC PANEL
ALBUMIN: 1.6 g/dL — AB (ref 3.5–5.0)
ALK PHOS: 131 U/L — AB (ref 38–126)
ALT: 14 U/L — ABNORMAL LOW (ref 17–63)
AST: 14 U/L — AB (ref 15–41)
Anion gap: 11 (ref 5–15)
BILIRUBIN TOTAL: 0.9 mg/dL (ref 0.3–1.2)
BUN: 65 mg/dL — AB (ref 6–20)
CALCIUM: 8.5 mg/dL — AB (ref 8.9–10.3)
CO2: 20 mmol/L — ABNORMAL LOW (ref 22–32)
CREATININE: 3.84 mg/dL — AB (ref 0.61–1.24)
Chloride: 103 mmol/L (ref 101–111)
GFR calc Af Amer: 21 mL/min — ABNORMAL LOW (ref >60)
GFR, EST NON AFRICAN AMERICAN: 18 mL/min — AB (ref >60)
GLUCOSE: 107 mg/dL — AB (ref 65–99)
Potassium: 3.3 mmol/L — ABNORMAL LOW (ref 3.5–5.1)
Sodium: 134 mmol/L — ABNORMAL LOW (ref 135–145)
TOTAL PROTEIN: 6.5 g/dL (ref 6.5–8.1)

## 2015-06-28 MED ORDER — HYDRALAZINE HCL 50 MG PO TABS
50.0000 mg | ORAL_TABLET | Freq: Three times a day (TID) | ORAL | Status: DC
  Administered 2015-06-28 – 2015-06-29 (×3): 50 mg via ORAL
  Filled 2015-06-28 (×3): qty 1

## 2015-06-28 MED ORDER — MORPHINE SULFATE (PF) 2 MG/ML IV SOLN
1.0000 mg | INTRAVENOUS | Status: DC | PRN
  Administered 2015-06-28 – 2015-06-30 (×4): 2 mg via INTRAVENOUS
  Filled 2015-06-28 (×4): qty 1

## 2015-06-28 MED ORDER — POTASSIUM CHLORIDE CRYS ER 20 MEQ PO TBCR
40.0000 meq | EXTENDED_RELEASE_TABLET | Freq: Once | ORAL | Status: AC
  Administered 2015-06-28: 40 meq via ORAL
  Filled 2015-06-28: qty 2

## 2015-06-28 MED ORDER — SODIUM CHLORIDE 0.9 % IV SOLN
INTRAVENOUS | Status: DC
  Administered 2015-06-28: 11:00:00 via INTRAVENOUS

## 2015-06-28 NOTE — Progress Notes (Signed)
Pt is complaining of pain in his right leg. Attempted to give Narco as ordered for pain, but pt vomited as soon he swallowed med. HR currently sustaining in the 120s. MD notified. Order received for IV pain med. Will continue to monitor.

## 2015-06-28 NOTE — Progress Notes (Signed)
Pharmacy Antibiotic Note  Charles [name] is a 40 y.o. male admitted on 06/23/2015 with pneumonia.  Pharmacy has been consulted for Vanc/Cefepime dosing.  Today, 06/28/2015:  Afeb since 5/26, WBC increased (decadron 5/26); CrCl at baseline ~20 ml/min  D5 abx; D4 vancomycin/cefepime  Plan:  Continue vancomycin + cefepime as ordered.  Vanc not stopped today d/t increase in WBC.  Could be due to recent dexamethasone on 5/26 but MD not narrowing today.  Cultures remain clear and afebrile, O2 sats good on RA.  If not able to stop vanc tomorrow 5/29, will check VT before 3rd dose.  Height: '5\' 2"'$  (157.5 cm) Weight: 141 lb 1.5 oz (64 kg) IBW/kg (Calculated) : 54.6  Temp (24hrs), Avg:98 F (36.7 C), Min:97.3 F (36.3 C), Max:98.5 F (36.9 C)   Recent Labs Lab 06/23/15 1507 06/24/15 0338 06/25/15 0324 06/26/15 0701 06/27/15 0328 06/28/15 0405  WBC  --  15.0* 14.6* 16.1* 12.0* 19.3*  CREATININE  --  3.85* 3.84* 3.79* 3.73* 3.84*  LATICACIDVEN 0.78  --   --   --   --   --     Estimated Creatinine Clearance: 19.7 mL/min (by C-G formula based on Cr of 3.84).    Allergies  Allergen Reactions  . Ciprofloxacin Rash  . Mobic [Meloxicam] Rash   Antimicrobials this admission: 5/24 Rocephin >> 5/25 5/25 Cefepime >>  5/25 Vanc >>  Dose adjustments this admission: ---  Microbiology results: 5/23 BCx: ngtd 5/23 UCx: multiple species; recollect 5/25: NGF 5/25 BCx: ngtd 5/26 MRSA PCR: neg S. pneumo UAg: neg  Thank you for allowing pharmacy to be a part of this patient's care.  Reuel Boom, PharmD, BCPS Pager: [phone] 06/28/2015, 10:43 AM

## 2015-06-28 NOTE — Progress Notes (Signed)
Subjective: Still have odd visual symptoms, sees walls moving, etc. Also had some hallucinations last night.   Exam: Filed Vitals:   06/28/15 1700 06/28/15 1810  BP: 138/79 143/69  Pulse: 108 125  Temp:    Resp: 21 26   Gen: In bed, NAD Resp: non-labored breathing, no acute distress Abd: soft, nt  Neuro: MS: awake, alert, interactive and appropriate SE:LTRVU, right upper field cut.  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 This is typically a disorder of cerebral autoregulation which occurs often the context of hypertension. Renal failure and immune suppression are also contributing factors. Decadron, cyclophosphamide, bortezomib ar all associated with pres. These have been discontinued at this time, but he was given a dose of Decadron today.  There are case reports of carfilzomib causing posterior reversible encephalopathy as well. Given the timing of this starting most recently, I do think that it could be associated with this episode.  Seizures can sometimes be associated with pres, but I would not start antiepileptic therapy unless seizures were to occur.  1) blood pressure control, goal normotension 2) I would hold further chemotherapeutic agents until the pres is controlled 3) we will follow  Roland Rack, MD [facility] [phone]  If 7pm- 7am, please page neurology on call as listed in [place].

## 2015-06-28 NOTE — Progress Notes (Signed)
PROGRESS NOTE    Charles [name]  HER:[id number] DOB: Nov 09, 1975 DOA: 06/23/2015 PCP: [name], MD   Brief Narrative: Charles [name] is a 40 y.o. male with medical history significant of Multiple Myeloma, CKD 3, back pain, currently undergoing chemotherapy, reports he was started on a new chemotherapeutic agent recently, last chemo was last week, went to the cancer center with Nausea, Vomiting, diarrhea, fever and chills x 4days and was sent to the ER. ED Course: Temp of 99.1, labs notable for creatinine of 4.2 up from 3.3 10 days ago   Assessment & Plan:   Principal Problem:   AKI (acute kidney injury) ([place]) Active Problems:   Multiple myeloma (HCC)   CKD (chronic kidney disease) stage 3, GFR 30-59 ml/min   Nausea with vomiting   Blurry vision, bilateral   HTN (hypertension), malignant  HTN Emergency, Pres syndrome;  Patient report vision changes the morning 5-26 MRI consistent with Pres.  Appreciate Neurology evaluation.  Discussed with Neurology BP goal 130 to 140.  Continue with Norvasc, metoprolol, hydralazine.  BP goal 12--130.  Discussed with neurology, patient will need repeated MRI prior to discharge.  Could increase Hydralazine for BP controlled.   AKI (acute kidney injury) ([place]) -on CKD 3, baseline is 3.3 -due to GI losses and poor Po intake -hydrate, monitor urine out put.  - [place] negative for hydronephrosis.  -will give IV fluids for 12 hours, sodium trending down, cr increased slightly.    Multiple myeloma (HCC) -on chemo, followed by Dr.Magrinat, = -elevated WBC could be due to hemoconcentration, decadron with chemo +/- infection -Appreciate Dr Jana Hakim Helps.  -Continue with MS-contin.  -discussed with Dr Jana Hakim, ok to hold decadron and revlimid.   UTI;  -Abnormal UA, some symptoms with urination reported -continue with  IV cefepime, FU cultures, monitor -Urine multiples bacteria, repeated urine culture no growth to date.     Nausea with vomiting -could be from chemo, ? Infection -supportive care, hydrate, Abx, monitor -improved. Advance diet as tolerated.  Improved.   PNA, Presume UTI.  Fevers; chest x ray consistent with PNA. On vancomycin and cefepime Day 4 antibiotics.  WBC increase today, follow trend.   Anemia; secondary to MM;.  Follow Hb trend, might need Blood transfusion.   DVT prophylaxis; Lovenox.  Code Status: full code.  Family Communication: care discussed with wife.  Disposition Plan: remain in the step down.    Consultants:   Dr Jana Hakim.   Neurology   Procedures;   Renal US; medical renal disease, no hydronephrosis.   Antimicrobials:  Ceftriaxone 5-24--cefepime 5-25  Vancomycin 5-25   Subjective: Less confuse per wife. Also vision loss improved. He is able to see better. Able to see different object. Report having hallucinations since last night. Report similar experience while taking steroids in the past. Pain is better controlled.    Objective: Filed Vitals:   06/28/15 0400 06/28/15 0500 06/28/15 0600 06/28/15 0609  BP: 145/72 133/72 137/77 137/77  Pulse: 93 91 90   Temp: 98.1 F (36.7 C)     TempSrc: Oral     Resp: _0 Height:      Weight:      SpO2: 96% 96% 96%     Intake/Output Summary (Last 24 hours) at 06/28/15 0752 Last data filed at 06/28/15 0600  Gross per 24 hour  Intake   1460 ml  Output   1125 ml  Net    335 ml   Filed Weights   06/23/15  1430 06/23/15 1433 06/26/15 1655  Weight: 67.132 kg (148 lb) 67.132 kg (148 lb) 64 kg (141 lb 1.5 oz)    Examination:  General exam: Appears calm and comfortable  Respiratory system: Clear to auscultation. Respiratory effort normal. Cardiovascular system: S1 & S2 heard, RRR. No JVD, murmurs, rubs, gallops or clicks. No pedal edema. Gastrointestinal system: Abdomen is nondistended, soft and nontender. No organomegaly or masses felt. Normal bowel sounds heard. Central nervous system: Alert .  Motor strength 5/5, vision improving  Extremities: Symmetric 5 x 5 power. Skin: No rashes, lesions or ulcers    Data Reviewed: I have personally reviewed following labs and imaging studies  CBC:  Recent Labs Lab 06/23/15 1215  06/23/15 1450 06/24/15 0338 06/25/15 0324 06/26/15 0701 06/27/15 0328 06/28/15 0405  WBC 19.8*  < > 21.2* 15.0* 14.6* 16.1* 12.0* 19.3*  NEUTROABS 16.8*  --  16.5*  --   --   --  10.5* 15.7*  HGB 9.6*  < > 9.1* 8.0* 8.6* 8.5* 7.4* 7.2*  HCT 29.0*  < > 27.6* 24.6* 26.6* 24.9* 23.0* 22.1*  MCV 93.3  < > 93.6 95.0 95.3 93.3 93.9 94.8  PLT 785*  < > 852* 762* 768* 648* 632* 557*  < > = values in this interval not displayed. Basic Metabolic Panel:  Recent Labs Lab 06/24/15 0338 06/25/15 0324 06/26/15 0701 06/27/15 0328 06/28/15 0405  NA 137 138 137 135 134*  K 4.0 3.7 3.7 3.5 3.3*  CL 108 110 108 106 103  CO2 20* 20* 21* 19* 20*  GLUCOSE 95 96 111* 194* 107*  BUN 60* 49* 47* 53* 65*  CREATININE 3.85* 3.84* 3.79* 3.73* 3.84*  CALCIUM 11.4* 11.4* 10.4* 9.4 8.5*   GFR: Estimated Creatinine Clearance: 19.7 mL/min (by C-G formula based on Cr of 3.84). Liver Function Tests:  Recent Labs Lab 06/23/15 1450 06/24/15 0338 06/27/15 0328 06/28/15 0405  AST 12* 10* 10* 14*  ALT 23 18 11* 14*  ALKPHOS 152* 118 111 131*  BILITOT 0.4 0.3 0.7 0.9  PROT 7.9 6.7 7.0 6.5  ALBUMIN 1.9* 1.6* 1.6* 1.6*   No results for input(s): LIPASE, AMYLASE in the last 168 hours. No results for input(s): AMMONIA in the last 168 hours. Coagulation Profile: No results for input(s): INR, PROTIME in the last 168 hours. Cardiac Enzymes: No results for input(s): CKTOTAL, CKMB, CKMBINDEX, TROPONINI in the last 168 hours. BNP (last 3 results) No results for input(s): PROBNP in the last 8760 hours. HbA1C: No results for input(s): HGBA1C in the last 72 hours. CBG: No results for input(s): GLUCAP in the last 168 hours. Lipid Profile: No results for input(s): CHOL, HDL,  LDLCALC, TRIG, CHOLHDL, LDLDIRECT in the last 72 hours. Thyroid Function Tests: No results for input(s): TSH, T4TOTAL, FREET4, T3FREE, THYROIDAB in the last 72 hours. Anemia Panel: No results for input(s): VITAMINB12, FOLATE, FERRITIN, TIBC, IRON, RETICCTPCT in the last 72 hours. Sepsis Labs:  Recent Labs Lab 06/23/15 1507  LATICACIDVEN 0.78    Recent Results (from the past 240 hour(s))  Urine culture     Status: Abnormal   Collection Time: 06/23/15  2:30 PM  Result Value Ref Range Status   Specimen Description URINE, CLEAN CATCH  Final   Special Requests NONE  Final   Culture MULTIPLE SPECIES PRESENT, SUGGEST RECOLLECTION (A)  Final   Report Status 06/25/2015 FINAL  Final  Blood Culture (routine x 2)     Status: None (Preliminary result)   Collection Time: 06/23/15  2:45 PM  Result Value Ref Range Status   Specimen Description BLOOD RIGHT ANTECUBITAL  Final   Special Requests BOTTLES DRAWN AEROBIC AND ANAEROBIC 5 CC EA  Final   Culture   Final    NO GROWTH 4 DAYS Performed at [facility]    Report Status PENDING  Incomplete  Blood Culture (routine x 2)     Status: None (Preliminary result)   Collection Time: 06/23/15  3:29 PM  Result Value Ref Range Status   Specimen Description BLOOD RIGHT HAND  Final   Special Requests BOTTLES DRAWN AEROBIC ONLY 9ML  Final   Culture   Final    NO GROWTH 4 DAYS Performed at [facility]    Report Status PENDING  Incomplete  Culture, blood (routine x 2) Call MD if unable to obtain prior to antibiotics being given     Status: None (Preliminary result)   Collection Time: 06/25/15  5:05 PM  Result Value Ref Range Status   Specimen Description BLOOD RIGHT ARM  Final   Special Requests BOTTLES DRAWN AEROBIC AND ANAEROBIC 8 CC EA  Final   Culture   Final    NO GROWTH 2 DAYS Performed at [facility]    Report Status PENDING  Incomplete  Culture, blood (routine x 2) Call MD if unable to obtain prior to antibiotics  being given     Status: None (Preliminary result)   Collection Time: 06/25/15  5:08 PM  Result Value Ref Range Status   Specimen Description BLOOD RIGHT HAND  Final   Special Requests BOTTLES DRAWN AEROBIC AND ANAEROBIC 10 CC EA  Final   Culture   Final    NO GROWTH 2 DAYS Performed at [facility]    Report Status PENDING  Incomplete  Urine culture     Status: None   Collection Time: 06/25/15  9:17 PM  Result Value Ref Range Status   Specimen Description URINE, CLEAN CATCH  Final   Special Requests NONE  Final   Culture NO GROWTH Performed at [facility]   Final   Report Status 06/27/2015 FINAL  Final  MRSA PCR Screening     Status: None   Collection Time: 06/26/15  5:00 PM  Result Value Ref Range Status   MRSA by PCR NEGATIVE NEGATIVE Final    Comment:        The GeneXpert MRSA Assay (FDA approved for NASAL specimens only), is one component of a comprehensive MRSA colonization surveillance program. It is not intended to diagnose MRSA infection nor to guide or monitor treatment for MRSA infections.          Radiology Studies: Mr Brain Wo Contrast  06/26/2015  CLINICAL DATA:  Bilateral blurry vision. Fever. History of multiple myeloma on chemotherapy. Elevated blood pressures. EXAM: MRI HEAD WITHOUT CONTRAST TECHNIQUE: Multiplanar, multiecho pulse sequences of the brain and surrounding structures were obtained without intravenous contrast. COMPARISON:  Head CT 10/11/2010 FINDINGS: There is abnormal T2 hyperintensity involving cortex and subcortical white matter in both occipital lobes and left greater than right parietal lobes. No restricted diffusion or hemorrhage is seen. The brain is otherwise normal in signal aside from scattered, punctate foci of T2 hyperintensity in the cerebral white matter which are nonspecific. No mass, midline shift, or extra-axial fluid collection is seen. Ventricles are normal in size. Orbits are unremarkable. A small amount of  fluid is present in the right maxillary sinus. The mastoid air cells are clear. Major intracranial  vascular flow voids are preserved. There is a 1.4 cm left frontal skull lesion with extension through the inner and outer tables of the skull and small volume epidural tumor without significant mass effect on the underlying brain. No brain edema. Multiple other, smaller lesions are scattered elsewhere in the skull. Baseline bone marrow signal is diffusely diminished throughout the skull and visualized upper cervical spine and may be related to ongoing treatment. IMPRESSION: 1. 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 2. Multiple skull lesions consistent with known multiple myeloma. 1.4 cm left frontal lesion with mild epidural extension. Electronically Signed   By: Logan Bores M.D.   On: 06/26/2015 12:22        Scheduled Meds: . sodium chloride  250 mL Intravenous Once  . acyclovir  400 mg Oral Daily  . amLODipine  10 mg Oral Daily  . ceFEPime (MAXIPIME) IV  1 g Intravenous Q24H  . docusate sodium  100 mg Oral Daily  . enoxaparin (LOVENOX) injection  30 mg Subcutaneous Q24H  . feeding supplement (ENSURE ENLIVE)  237 mL Oral TID BM  . hydrALAZINE  25 mg Oral Q8H  . metoprolol tartrate  50 mg Oral BID  . mirtazapine  15 mg Oral QHS  . morphine  15 mg Oral Q12H  . polyethylene glycol  17 g Oral Daily  . vancomycin  1,000 mg Intravenous Q48H   Continuous Infusions:     LOS: 5 days    Time spent: 25 minutes.     Elmarie Shiley, MD [facility] Pager [phone]  If 7PM-7AM, please contact night-coverage [email or web address] Password [facility] 06/28/2015, 7:52 AM

## 2015-06-29 DIAGNOSIS — I6783 Posterior reversible encephalopathy syndrome: Secondary | ICD-10-CM | POA: Insufficient documentation

## 2015-06-29 LAB — CBC WITH DIFFERENTIAL/PLATELET
BASOS ABS: 0 10*3/uL (ref 0.0–0.1)
Basophils Relative: 0 %
EOS ABS: 0.2 10*3/uL (ref 0.0–0.7)
Eosinophils Relative: 1 %
HEMATOCRIT: 21.7 % — AB (ref 39.0–52.0)
Hemoglobin: 7 g/dL — ABNORMAL LOW (ref 13.0–17.0)
LYMPHS ABS: 2.1 10*3/uL (ref 0.7–4.0)
Lymphocytes Relative: 12 %
MCH: 31.1 pg (ref 26.0–34.0)
MCHC: 32.3 g/dL (ref 30.0–36.0)
MCV: 96.4 fL (ref 78.0–100.0)
MONO ABS: 1.7 10*3/uL — AB (ref 0.1–1.0)
Monocytes Relative: 10 %
NEUTROS ABS: 13.4 10*3/uL — AB (ref 1.7–7.7)
Neutrophils Relative %: 77 %
PLATELETS: 434 10*3/uL — AB (ref 150–400)
RBC: 2.25 MIL/uL — ABNORMAL LOW (ref 4.22–5.81)
RDW: 15.1 % (ref 11.5–15.5)
WBC: 17.4 10*3/uL — ABNORMAL HIGH (ref 4.0–10.5)

## 2015-06-29 LAB — BASIC METABOLIC PANEL
Anion gap: 10 (ref 5–15)
BUN: 69 mg/dL — ABNORMAL HIGH (ref 6–20)
CALCIUM: 8.3 mg/dL — AB (ref 8.9–10.3)
CO2: 18 mmol/L — AB (ref 22–32)
CREATININE: 3.53 mg/dL — AB (ref 0.61–1.24)
Chloride: 109 mmol/L (ref 101–111)
GFR calc non Af Amer: 20 mL/min — ABNORMAL LOW (ref >60)
GFR, EST AFRICAN AMERICAN: 23 mL/min — AB (ref >60)
Glucose, Bld: 123 mg/dL — ABNORMAL HIGH (ref 65–99)
Potassium: 3.3 mmol/L — ABNORMAL LOW (ref 3.5–5.1)
SODIUM: 137 mmol/L (ref 135–145)

## 2015-06-29 LAB — VANCOMYCIN, TROUGH: Vancomycin Tr: 14 ug/mL (ref 10.0–20.0)

## 2015-06-29 MED ORDER — METOPROLOL TARTRATE 50 MG PO TABS
75.0000 mg | ORAL_TABLET | Freq: Two times a day (BID) | ORAL | Status: DC
  Administered 2015-06-29 – 2015-07-01 (×5): 75 mg via ORAL
  Filled 2015-06-29 (×5): qty 3
  Filled 2015-06-29: qty 1
  Filled 2015-06-29: qty 3
  Filled 2015-06-29: qty 1

## 2015-06-29 MED ORDER — NICARDIPINE HCL IN NACL 40-0.83 MG/200ML-% IV SOLN
3.0000 mg/h | INTRAVENOUS | Status: DC
  Administered 2015-06-29: 15 mg/h via INTRAVENOUS
  Administered 2015-06-30 (×4): 10 mg/h via INTRAVENOUS
  Administered 2015-07-01: 7.5 mg/h via INTRAVENOUS
  Filled 2015-06-29 (×7): qty 200

## 2015-06-29 MED ORDER — SODIUM CHLORIDE 0.9 % IV SOLN
INTRAVENOUS | Status: DC
  Administered 2015-06-29: 08:00:00 via INTRAVENOUS

## 2015-06-29 MED ORDER — SODIUM CHLORIDE 0.9 % IV SOLN
INTRAVENOUS | Status: DC
  Administered 2015-06-29: 19:00:00 via INTRAVENOUS

## 2015-06-29 MED ORDER — VANCOMYCIN HCL IN DEXTROSE 750-5 MG/150ML-% IV SOLN
750.0000 mg | INTRAVENOUS | Status: DC
  Filled 2015-06-29: qty 150

## 2015-06-29 MED ORDER — NICARDIPINE HCL IN NACL 20-0.86 MG/200ML-% IV SOLN
3.0000 mg/h | INTRAVENOUS | Status: DC
  Administered 2015-06-29: 5 mg/h via INTRAVENOUS
  Filled 2015-06-29: qty 200

## 2015-06-29 MED ORDER — POTASSIUM CHLORIDE CRYS ER 20 MEQ PO TBCR: 20.0000 meq | EXTENDED_RELEASE_TABLET | Freq: Once | ORAL | Status: DC

## 2015-06-29 MED ORDER — ACETAMINOPHEN 500 MG PO TABS
500.0000 mg | ORAL_TABLET | Freq: Once | ORAL | Status: AC
  Administered 2015-06-29: 500 mg via ORAL
  Filled 2015-06-29: qty 1

## 2015-06-29 MED ORDER — HYDRALAZINE HCL 50 MG PO TABS
75.0000 mg | ORAL_TABLET | Freq: Three times a day (TID) | ORAL | Status: DC
Start: 2015-06-29 — End: 2015-07-02
  Administered 2015-06-29 – 2015-07-01 (×5): 75 mg via ORAL
  Filled 2015-06-29 (×8): qty 1

## 2015-06-29 MED ORDER — POTASSIUM CHLORIDE 10 MEQ/100ML IV SOLN
10.0000 meq | INTRAVENOUS | Status: AC
  Administered 2015-06-29 (×2): 10 meq via INTRAVENOUS
  Filled 2015-06-29 (×3): qty 100

## 2015-06-29 MED ORDER — ACETAMINOPHEN 10 MG/ML IV SOLN
500.0000 mg | Freq: Once | INTRAVENOUS | Status: AC
  Administered 2015-06-29: 500 mg via INTRAVENOUS
  Filled 2015-06-29: qty 50

## 2015-06-29 NOTE — Progress Notes (Signed)
Notified on call NP of pink tinged urine. Most recent HGB was 7.0 and has been trending down the past few days. Patient per off going day shift nurse stated urine changed to pink tinged this afternoon. Will continue to monitor.

## 2015-06-29 NOTE — Progress Notes (Signed)
Date:  Jun 29, 2015 Chart reviewed for concurrent status and case management needs. Will continue to follow patient for changes and needs:  Transferred to sdu due to sepsis Expected discharge date: [id number] Charles [name], [name], [name], [name]

## 2015-06-29 NOTE — Progress Notes (Signed)
PROGRESS NOTE    Charles [name]  DTP:[id number] DOB: 12/15/1975 DOA: 06/23/2015 PCP: [name], MD   Brief Narrative: Charles [name] is a 40 y.o. male with medical history significant of Multiple Myeloma, CKD 3, back pain, currently undergoing chemotherapy, reports he was started on a new chemotherapeutic agent recently, last chemo was last week, went to the cancer center with Nausea, Vomiting, diarrhea, fever and chills x 4days and was sent to the ER. ED Course: Temp of 99.1, labs notable for creatinine of 4.2 up from 3.3 10 days ago   Assessment & Plan:   Principal Problem:   AKI (acute kidney injury) ([place]) Active Problems:   Multiple myeloma (HCC)   CKD (chronic kidney disease) stage 3, GFR 30-59 ml/min   Nausea with vomiting   Blurry vision, bilateral   HTN (hypertension), malignant  HTN Emergency, Pres syndrome;  Patient report vision changes the morning 5-26 MRI consistent with Pres.  Appreciate Neurology evaluation.  Discussed with Neurology BP goal 130 to 140.  Continue with Norvasc, metoprolol, hydralazine.  BP goal 12--130.  Discussed with neurology, patient will need repeated MRI prior to discharge. MRI ordered for tomorrow.  Could increase Hydralazine today to 75 mg, metoprolol to 75.  Continue with Norvasc.   AKI (acute kidney injury) ([place]) -on CKD 3, baseline is 3.3 -due to GI losses and poor Po intake -hydrate, monitor urine out put.  - [place] negative for hydronephrosis.  -Will give iv fluids for 10 hours.  Cr stable.    Multiple myeloma (HCC) -on chemo, followed by Dr.Magrinat, = -elevated WBC could be due to hemoconcentration, decadron with chemo +/- infection -Appreciate Dr Jana Hakim Helps.  -Continue with MS-contin.  -discussed with Dr Jana Hakim, ok to hold decadron and revlimid.   UTI;  -Abnormal UA, some symptoms with urination reported -continue with  IV cefepime, FU cultures, monitor -Urine multiples bacteria, repeated urine  culture no growth to date.    Nausea with vomiting -could be from chemo, ? Infection -supportive care, hydrate, Abx, monitor -improved. Advance diet as tolerated.  Improved.   PNA, Presume UTI.  Fevers; chest x ray consistent with PNA. On vancomycin and cefepime Day 5 antibiotics.  WBC fluctuates.   Anemia; secondary to MM;.  Follow Hb trend, transfuse if hb continue to drop/   DVT prophylaxis; Lovenox.  Code Status: full code.  Family Communication: care discussed with wife.  Disposition Plan: if BP stable might be able to be transfer to telemetry    Consultants:   Dr Jana Hakim.   Neurology   Procedures;   Renal US; medical renal disease, no hydronephrosis.   Antimicrobials:  Ceftriaxone 5-24--cefepime 5-25  Vancomycin 5-25   Subjective: Vision much better, still with some blurry vision, still with hallucination less confuse per wife. .   Objective: Filed Vitals:   06/29/15 0600 06/29/15 0700 06/29/15 0800 06/29/15 0900  BP: 141/76 145/80 141/72 159/72  Pulse: 103 105 106 103  Temp:      TempSrc:      Resp: 17 18 20 17   Height:      Weight:      SpO2: 96% 96% 96% 96%    Intake/Output Summary (Last 24 hours) at 06/29/15 0928 Last data filed at 06/28/15 2246  Gross per 24 hour  Intake    965 ml  Output    475 ml  Net    490 ml   Filed Weights   06/23/15 1430 06/23/15 1433 06/26/15 1655  Weight: 67.132 kg (148  lb) 67.132 kg (148 lb) 64 kg (141 lb 1.5 oz)    Examination:  General exam: Appears calm and comfortable  Respiratory system: Clear to auscultation. Respiratory effort normal. Cardiovascular system: S1 & S2 heard, RRR. No JVD, murmurs, rubs, gallops or clicks. No pedal edema. Gastrointestinal system: Abdomen is nondistended, soft and nontender. No organomegaly or masses felt. Normal bowel sounds heard. Central nervous system: Alert . Motor strength 5/5, vision improving  Extremities: Symmetric 5 x 5 power. Skin: No rashes, lesions or  ulcers    Data Reviewed: I have personally reviewed following labs and imaging studies  CBC:  Recent Labs Lab 06/23/15 1215 06/23/15 1450  06/25/15 0324 06/26/15 0701 06/27/15 0328 06/28/15 0405 06/29/15 0311  WBC 19.8* 21.2*  < > 14.6* 16.1* 12.0* 19.3* 17.4*  NEUTROABS 16.8* 16.5*  --   --   --  10.5* 15.7* 13.4*  HGB 9.6* 9.1*  < > 8.6* 8.5* 7.4* 7.2* 7.0*  HCT 29.0* 27.6*  < > 26.6* 24.9* 23.0* 22.1* 21.7*  MCV 93.3 93.6  < > 95.3 93.3 93.9 94.8 96.4  PLT 785* 852*  < > 768* 648* 632* 557* 434*  < > = values in this interval not displayed. Basic Metabolic Panel:  Recent Labs Lab 06/25/15 0324 06/26/15 0701 06/27/15 0328 06/28/15 0405 06/29/15 0311  NA 138 137 135 134* 137  K 3.7 3.7 3.5 3.3* 3.3*  CL 110 108 106 103 109  CO2 20* 21* 19* 20* 18*  GLUCOSE 96 111* 194* 107* 123*  BUN 49* 47* 53* 65* 69*  CREATININE 3.84* 3.79* 3.73* 3.84* 3.53*  CALCIUM 11.4* 10.4* 9.4 8.5* 8.3*   GFR: Estimated Creatinine Clearance: 21.5 mL/min (by C-G formula based on Cr of 3.53). Liver Function Tests:  Recent Labs Lab 06/23/15 1450 06/24/15 0338 06/27/15 0328 06/28/15 0405  AST 12* 10* 10* 14*  ALT 23 18 11* 14*  ALKPHOS 152* 118 111 131*  BILITOT 0.4 0.3 0.7 0.9  PROT 7.9 6.7 7.0 6.5  ALBUMIN 1.9* 1.6* 1.6* 1.6*   No results for input(s): LIPASE, AMYLASE in the last 168 hours. No results for input(s): AMMONIA in the last 168 hours. Coagulation Profile: No results for input(s): INR, PROTIME in the last 168 hours. Cardiac Enzymes: No results for input(s): CKTOTAL, CKMB, CKMBINDEX, TROPONINI in the last 168 hours. BNP (last 3 results) No results for input(s): PROBNP in the last 8760 hours. HbA1C: No results for input(s): HGBA1C in the last 72 hours. CBG: No results for input(s): GLUCAP in the last 168 hours. Lipid Profile: No results for input(s): CHOL, HDL, LDLCALC, TRIG, CHOLHDL, LDLDIRECT in the last 72 hours. Thyroid Function Tests: No results for  input(s): TSH, T4TOTAL, FREET4, T3FREE, THYROIDAB in the last 72 hours. Anemia Panel: No results for input(s): VITAMINB12, FOLATE, FERRITIN, TIBC, IRON, RETICCTPCT in the last 72 hours. Sepsis Labs:  Recent Labs Lab 06/23/15 1507  LATICACIDVEN 0.78    Recent Results (from the past 240 hour(s))  Urine culture     Status: Abnormal   Collection Time: 06/23/15  2:30 PM  Result Value Ref Range Status   Specimen Description URINE, CLEAN CATCH  Final   Special Requests NONE  Final   Culture MULTIPLE SPECIES PRESENT, SUGGEST RECOLLECTION (A)  Final   Report Status 06/25/2015 FINAL  Final  Blood Culture (routine x 2)     Status: None   Collection Time: 06/23/15  2:45 PM  Result Value Ref Range Status   Specimen Description BLOOD RIGHT  ANTECUBITAL  Final   Special Requests BOTTLES DRAWN AEROBIC AND ANAEROBIC 5 CC EA  Final   Culture   Final    NO GROWTH 5 DAYS Performed at [facility]    Report Status 06/28/2015 FINAL  Final  Blood Culture (routine x 2)     Status: None   Collection Time: 06/23/15  3:29 PM  Result Value Ref Range Status   Specimen Description BLOOD RIGHT HAND  Final   Special Requests BOTTLES DRAWN AEROBIC ONLY 9ML  Final   Culture   Final    NO GROWTH 5 DAYS Performed at [facility]    Report Status 06/28/2015 FINAL  Final  Culture, blood (routine x 2) Call MD if unable to obtain prior to antibiotics being given     Status: None (Preliminary result)   Collection Time: 06/25/15  5:05 PM  Result Value Ref Range Status   Specimen Description BLOOD RIGHT ARM  Final   Special Requests BOTTLES DRAWN AEROBIC AND ANAEROBIC 8 CC EA  Final   Culture   Final    NO GROWTH 3 DAYS Performed at [facility]    Report Status PENDING  Incomplete  Culture, blood (routine x 2) Call MD if unable to obtain prior to antibiotics being given     Status: None (Preliminary result)   Collection Time: 06/25/15  5:08 PM  Result Value Ref Range Status   Specimen  Description BLOOD RIGHT HAND  Final   Special Requests BOTTLES DRAWN AEROBIC AND ANAEROBIC 10 CC EA  Final   Culture   Final    NO GROWTH 3 DAYS Performed at [facility]    Report Status PENDING  Incomplete  Urine culture     Status: None   Collection Time: 06/25/15  9:17 PM  Result Value Ref Range Status   Specimen Description URINE, CLEAN CATCH  Final   Special Requests NONE  Final   Culture NO GROWTH Performed at [facility]   Final   Report Status 06/27/2015 FINAL  Final  MRSA PCR Screening     Status: None   Collection Time: 06/26/15  5:00 PM  Result Value Ref Range Status   MRSA by PCR NEGATIVE NEGATIVE Final    Comment:        The GeneXpert MRSA Assay (FDA approved for NASAL specimens only), is one component of a comprehensive MRSA colonization surveillance program. It is not intended to diagnose MRSA infection nor to guide or monitor treatment for MRSA infections.          Radiology Studies: No results found.      Scheduled Meds: . sodium chloride  250 mL Intravenous Once  . acyclovir  400 mg Oral Daily  . amLODipine  10 mg Oral Daily  . ceFEPime (MAXIPIME) IV  1 g Intravenous Q24H  . docusate sodium  100 mg Oral Daily  . enoxaparin (LOVENOX) injection  30 mg Subcutaneous Q24H  . feeding supplement (ENSURE ENLIVE)  237 mL Oral TID BM  . hydrALAZINE  75 mg Oral Q8H  . metoprolol tartrate  75 mg Oral BID  . mirtazapine  15 mg Oral QHS  . morphine  15 mg Oral Q12H  . polyethylene glycol  17 g Oral Daily  . vancomycin  1,000 mg Intravenous Q48H   Continuous Infusions: . sodium chloride Stopped (06/28/15 2246)  . sodium chloride 10 mL/hr at 06/29/15 0815     LOS: 6 days    Time  spent: 25 minutes.     Elmarie Shiley, MD [facility] Pager [phone]  If 7PM-7AM, please contact night-coverage [email or web address] Password [email or web address] 06/29/2015, 9:28 AM

## 2015-06-29 NOTE — Progress Notes (Signed)
Pharmacy Antibiotic Note  Charles [name] is a 40 y.o. male admitted on 06/23/2015 with suspected pneumonia.  Pharmacy has been consulted for vancomycin and cefepime dosing. Today is day #5 of broad spectrum antibiotics  Plan: Cefepime 1g IV q24h Vancomycin 1g IV q48h. Measure Vanc trough at steady state. Follow up renal fxn, culture results, and clinical course. Follow up narrowing antibiotics.   Height: '5\' 2"'$  (157.5 cm) Weight: 141 lb 1.5 oz (64 kg) IBW/kg (Calculated) : 54.6  Temp (24hrs), Avg:98.7 F (37.1 C), Min:98.1 F (36.7 C), Max:99.7 F (37.6 C)   Recent Labs Lab 06/23/15 1507  06/25/15 0324 06/26/15 0701 06/27/15 0328 06/28/15 0405 06/29/15 0311  WBC  --   < > 14.6* 16.1* 12.0* 19.3* 17.4*  CREATININE  --   < > 3.84* 3.79* 3.73* 3.84* 3.53*  LATICACIDVEN 0.78  --   --   --   --   --   --   < > = values in this interval not displayed.  Estimated Creatinine Clearance: 21.5 mL/min (by C-G formula based on Cr of 3.53).    Allergies  Allergen Reactions  . Ciprofloxacin Rash  . Mobic [Meloxicam] Rash    Antimicrobials this admission: 5/24 Rocephin >> 5/25 5/25 Cefepime >>  5/25 Vanc >>  Dose adjustments this admission: 5/29 1600 VT = ___ on Vanc 1g q48h (prior to 3rd dose)  Microbiology results: 5/23 BCx: ngtd 5/23 UCx: multiple species; recollect 5/25: NGF 5/25 BCx: ngtd 5/26 MRSA PCR: neg S. pneumo UAg: neg  Thank you for allowing pharmacy to be a part of this patient's care.  Gretta Arab PharmD, BCPS Pager [phone] 06/29/2015 12:00 PM

## 2015-06-29 NOTE — Progress Notes (Signed)
Brief pharmacy antibiotic note: For full details see note from Gretta Arab,. PharmD from earlier today  Vanc trough=14 (subtherapeutic)  Plan: Todays vanc dose already given Starting tomorrow give vancomycin '750mg'$  IV q24h Continue cefepime 1gm IV q24h Follow up narrowing antibiotics  Dolly Rias RPh 06/29/2015, 5:13 PM Pager [phone]

## 2015-06-29 NOTE — Progress Notes (Signed)
Cardene drip changed to DOUBLE STRENGTH.  Patient's RN Mendel Ryder) informed of strength change.  Dia Sitter, PharmD, BCPS 06/29/2015 3:36 PM

## 2015-06-29 NOTE — Progress Notes (Signed)
Interval History:                                                                                                                      Charles [name] is an 40 y.o. male patient with  multiple myeloma, undergoing chemotherapy, poorly controlled hypertension with systolic blood pressures and 170s and 180s earlier today during my evaluation. 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 Clinicallyand can change. He continues to have left hemianopia muscles mental pain symptoms predominantly involving left shoulder and bilateral hip areas. Patient is Spanish-speaking but can understand some English and able to obtain history through the family members who were able to speak fluently.  Past Medical History: Past Medical History  Diagnosis Date  . 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 CKD (chronic kidney disease), stage III   . Hypercholesterolemia   . Lytic bone lesions on xray     History reviewed. No pertinent past surgical history.  Family History: Family History  Problem Relation Age of Onset  . Diabetes Father     Social History:   reports that he has never smoked. He has never used smokeless tobacco. He reports that he does not drink alcohol or use illicit drugs.  Allergies:  Allergies  Allergen Reactions  . Ciprofloxacin Rash  . Mobic [Meloxicam] Rash     Medications:                                                                                                                         Current facility-administered medications:  .  0.9 %  sodium chloride infusion, 250 mL, Intravenous, Once, [name], MD, Stopped at 06/23/15 2056 .  0.9 %  sodium chloride infusion, , Intravenous, Continuous, Belkys A Regalado, MD, Last Rate: 10 mL/hr at 06/29/15 1743 .  0.9 %  sodium chloride infusion, , Intravenous, Continuous, Belkys A Regalado, MD, Last Rate: 10 mL/hr at 06/29/15 1857 .  acetaminophen (TYLENOL) tablet 650 mg, 650 mg, Oral, Q6H PRN, Belkys A Regalado, MD, 650 mg  at 06/29/15 1502 .  acyclovir (ZOVIRAX) tablet 400 mg, 400 mg, Oral, Daily, Belkys A Regalado, MD, 400 mg at 06/29/15 1034 .  ceFEPIme (MAXIPIME) 1 g in dextrose 5 % 50 mL IVPB, 1 g, Intravenous, Q24H, Belkys A Regalado, MD, 1 g at 06/29/15 1854 .  docusate sodium (COLACE) capsule 100 mg, 100 mg, Oral, Daily, [name], MD, 100 mg at 06/29/15 1024 .  enoxaparin (LOVENOX) injection 30 mg,  30 mg, Subcutaneous, Q24H, Domenic Polite, MD, 30 mg at 06/28/15 2036 .  feeding supplement (ENSURE ENLIVE) (ENSURE ENLIVE) liquid 237 mL, 237 mL, Oral, TID BM, Clayton Bibles, RD, 237 mL at 06/29/15 1358 .  heparin lock flush 100 unit/mL, 500 Units, Intracatheter, Daily PRN, [name], MD .  heparin lock flush 100 unit/mL, 250 Units, Intracatheter, PRN, [name], MD .  hydrALAZINE (APRESOLINE) injection 10 mg, 10 mg, Intravenous, Q6H PRN, Belkys A Regalado, MD, 10 mg at 06/29/15 0205 .  hydrALAZINE (APRESOLINE) tablet 75 mg, 75 mg, Oral, Q8H, Belkys A Regalado, MD, 75 mg at 06/29/15 1358 .  HYDROcodone-acetaminophen (NORCO) 10-325 MG per tablet 1-2 tablet, 1-2 tablet, Oral, Q4H PRN, [name], MD, 1 tablet at 06/29/15 1358 .  metoprolol tartrate (LOPRESSOR) tablet 75 mg, 75 mg, Oral, BID, Belkys A Regalado, MD, 75 mg at 06/29/15 1024 .  mirtazapine (REMERON) tablet 15 mg, 15 mg, Oral, QHS, [name], MD, 15 mg at 06/28/15 2201 .  morphine (MS CONTIN) 12 hr tablet 15 mg, 15 mg, Oral, Q12H, [name], MD, 15 mg at 06/29/15 1024 .  morphine 2 MG/ML injection 1-2 mg, 1-2 mg, Intravenous, Q4H PRN, Belkys A Regalado, MD, 2 mg at 06/29/15 1854 .  nicardipine (CARDENE) 97m in 0.83% saline 2062mIV DOUBLE STRENGTH infusion (0.2 mg/ml), 3-15 mg/hr, Intravenous, Continuous, Belkys A Regalado, MD, Last Rate: 15 mL/hr at 06/29/15 1900, 3 mg/hr at 06/29/15 1900 .  polyethylene glycol (MIRALAX / GLYCOLAX) packet 17 g, 17 g, Oral, Daily, GuChauncey CruelMD, 17 g at 06/29/15  1024 .  prochlorperazine (COMPAZINE) injection 10 mg, 10 mg, Intravenous, TID PRN, Belkys A Regalado, MD, 10 mg at 06/28/15 1804 .  sodium chloride flush (NS) 0.9 % injection 10 mL, 10 mL, Intracatheter, PRN, GuChauncey CruelMD .  sodium chloride flush (NS) 0.9 % injection 3 mL, 3 mL, Intracatheter, PRN, GuChauncey CruelMD .  [SDerrill MemoN 06/30/2015] vancomycin (VANCOCIN) IVPB 750 mg/150 ml premix, 750 mg, Intravenous, Q24H, [facility] Neu[facility]ic Examination:                                                                                                     Today's Vitals   06/29/15 1745 06/29/15 1800 06/29/15 1814 06/29/15 1855  BP: 135/61 132/59    Pulse: 109 109    Temp:   100.1 F (37.8 C)   TempSrc:   Oral   Resp: 27 27    Height:      Weight:      SpO2: 93% 93%    PainSc:    7     Evaluation of higher integrative functions including: Level of alertness: Alert,  Oriented to time, place and person Speech: fluent, no evidence of dysarthria or aphasia noted.  Test the following cranial nerves: 3-12 grossly intact, left hemianopia noted with confrontation testing. Motor examination: Severe pain in the left shoulder with difficulty with left shoulder abduction, and pain in bilateral hips with hip flexion difficulty secondary to pain.  Otherwise, able to demonstrate full motor strength in other muscle groups in  extremities Examination of sensation : Normal and symmetric sensation to pinprick in all 4 extremities and on face Examination of deep tendon reflexes: 2+, normal and symmetric in all extremities, normal plantars bilaterally Test coordination: Normal finger nose testing, with no evidence of limb appendicular ataxia or abnormal involuntary movements or tremors noted.  Gait: Deferred   Lab Results: Basic Metabolic Panel:  Recent Labs Lab 06/25/15 0324 06/26/15 0701 06/27/15 0328 06/28/15 0405 06/29/15 0311  NA 138 137 135 134* 137  K 3.7 3.7 3.5 3.3*  3.3*  CL 110 108 106 103 109  CO2 20* 21* 19* 20* 18*  GLUCOSE 96 111* 194* 107* 123*  BUN 49* 47* 53* 65* 69*  CREATININE 3.84* 3.79* 3.73* 3.84* 3.53*  CALCIUM 11.4* 10.4* 9.4 8.5* 8.3*    Liver Function Tests:  Recent Labs Lab 06/23/15 1450 06/24/15 0338 06/27/15 0328 06/28/15 0405  AST 12* 10* 10* 14*  ALT 23 18 11* 14*  ALKPHOS 152* 118 111 131*  BILITOT 0.4 0.3 0.7 0.9  PROT 7.9 6.7 7.0 6.5  ALBUMIN 1.9* 1.6* 1.6* 1.6*   No results for input(s): LIPASE, AMYLASE in the last 168 hours. No results for input(s): AMMONIA in the last 168 hours.  CBC:  Recent Labs Lab 06/23/15 1215 06/23/15 1450  06/25/15 0324 06/26/15 0701 06/27/15 0328 06/28/15 0405 06/29/15 0311  WBC 19.8* 21.2*  < > 14.6* 16.1* 12.0* 19.3* 17.4*  NEUTROABS 16.8* 16.5*  --   --   --  10.5* 15.7* 13.4*  HGB 9.6* 9.1*  < > 8.6* 8.5* 7.4* 7.2* 7.0*  HCT 29.0* 27.6*  < > 26.6* 24.9* 23.0* 22.1* 21.7*  MCV 93.3 93.6  < > 95.3 93.3 93.9 94.8 96.4  PLT 785* 852*  < > 768* 648* 632* 557* 434*  < > = values in this interval not displayed.  Cardiac Enzymes: No results for input(s): CKTOTAL, CKMB, CKMBINDEX, TROPONINI in the last 168 hours.  Lipid Panel: No results for input(s): CHOL, TRIG, HDL, CHOLHDL, VLDL, LDLCALC in the last 168 hours.  CBG: No results for input(s): GLUCAP in the last 168 hours.  Microbiology: Results for orders placed or performed during the hospital encounter of 06/23/15  Urine culture     Status: Abnormal   Collection Time: 06/23/15  2:30 PM  Result Value Ref Range Status   Specimen Description URINE, CLEAN CATCH  Final   Special Requests NONE  Final   Culture MULTIPLE SPECIES PRESENT, SUGGEST RECOLLECTION (A)  Final   Report Status 06/25/2015 FINAL  Final  Blood Culture (routine x 2)     Status: None   Collection Time: 06/23/15  2:45 PM  Result Value Ref Range Status   Specimen Description BLOOD RIGHT ANTECUBITAL  Final   Special Requests BOTTLES DRAWN AEROBIC AND  ANAEROBIC 5 CC EA  Final   Culture   Final    NO GROWTH 5 DAYS Performed at [facility]    Report Status 06/28/2015 FINAL  Final  Blood Culture (routine x 2)     Status: None   Collection Time: 06/23/15  3:29 PM  Result Value Ref Range Status   Specimen Description BLOOD RIGHT HAND  Final   Special Requests BOTTLES DRAWN AEROBIC ONLY 9ML  Final   Culture   Final    NO GROWTH 5 DAYS Performed at [facility]    Report Status 06/28/2015 FINAL  Final  Culture, blood (routine x 2) Call MD if unable to obtain prior to antibiotics being given     Status: None (Preliminary result)   Collection Time: 06/25/15  5:05 PM  Result Value Ref Range Status   Specimen Description BLOOD RIGHT ARM  Final   Special Requests BOTTLES DRAWN AEROBIC AND ANAEROBIC 8 CC EA  Final   Culture   Final    NO GROWTH 4 DAYS Performed at [facility]    Report Status PENDING  Incomplete  Culture, blood (routine x 2) Call MD if unable to obtain prior to antibiotics being given     Status: None (Preliminary result)   Collection Time: 06/25/15  5:08 PM  Result Value Ref Range Status   Specimen Description BLOOD RIGHT HAND  Final   Special Requests BOTTLES DRAWN AEROBIC AND ANAEROBIC 10 CC EA  Final   Culture   Final    NO GROWTH 4 DAYS Performed at [facility]    Report Status PENDING  Incomplete  Urine culture     Status: None   Collection Time: 06/25/15  9:17 PM  Result Value Ref Range Status   Specimen Description URINE, CLEAN CATCH  Final   Special Requests NONE  Final   Culture NO GROWTH Performed at [facility]   Final   Report Status 06/27/2015 FINAL  Final  MRSA PCR Screening     Status: None   Collection Time: 06/26/15  5:00 PM  Result Value Ref Range Status   MRSA by PCR NEGATIVE NEGATIVE Final    Comment:        The GeneXpert MRSA Assay (FDA approved for NASAL specimens only), is one component of a comprehensive MRSA colonization surveillance program.  It is not intended to diagnose MRSA infection nor to guide or monitor treatment for MRSA infections.     Imaging: Mr Brain Wo Contrast  06/26/2015  CLINICAL DATA:  Bilateral blurry vision. Fever. History of multiple myeloma on chemotherapy. Elevated blood pressures. EXAM: MRI HEAD WITHOUT CONTRAST TECHNIQUE: Multiplanar, multiecho pulse sequences of the brain and surrounding structures were obtained without intravenous contrast. COMPARISON:  Head CT 10/11/2010 FINDINGS: There is abnormal T2 hyperintensity involving cortex and subcortical white matter in both occipital lobes and left greater than right parietal lobes. No restricted diffusion or hemorrhage is seen. The brain is otherwise normal in signal aside from scattered, punctate foci of T2 hyperintensity in the cerebral white matter which are nonspecific. No mass, midline shift, or extra-axial fluid collection is seen. Ventricles are normal in size. Orbits are unremarkable. A small amount of fluid is present in the right maxillary sinus. The mastoid air cells are clear. Major intracranial vascular flow voids are preserved. There is a 1.4 cm left frontal skull lesion with extension through the inner and outer tables of the skull and small volume epidural tumor without significant mass effect on the underlying brain. No brain edema. Multiple other, smaller lesions are scattered elsewhere in the skull. Baseline bone marrow signal is diffusely diminished throughout the skull and visualized upper cervical spine and may be related to ongoing treatment. IMPRESSION: 1. 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 2. Multiple skull lesions consistent with known multiple myeloma. 1.4 cm left frontal lesion with mild epidural extension. Electronically Signed   By: Logan Bores M.D.   On: 06/26/2015 12:22    Assessment and plan:   Charles [name] is an 40 y.o. male patient with poorly controlled hypertension,  continues to have blood pressures in the 683A systolic. Recommend starting nicardipine drip for tight control of blood pressure with goal systolic blood pressure between 120-140. Discussed with primary hospitalist.  Clinically he has left hemianopia from the involvement of occipital lobes due to posterior reversible encephalopathy.  Based on his clinical improvement, we'll consider repeating the brain MRI in one to 2 weeks to monitor for resolution of the PRES changes seen on the previous brain MRI.  Discussed with patient and his family, answered several of the questions to her satisfaction.  Discussed with primary hospitalist.

## 2015-06-30 ENCOUNTER — Inpatient Hospital Stay (HOSPITAL_COMMUNITY): Payer: Medicaid Other

## 2015-06-30 ENCOUNTER — Ambulatory Visit: Payer: Self-pay | Admitting: Radiation Oncology

## 2015-06-30 ENCOUNTER — Ambulatory Visit
Admit: 2015-06-30 | Discharge: 2015-06-30 | Disposition: A | Discharge status: Home / Self Care | Payer: MEDICAID | Attending: Radiation Oncology | Admitting: Radiation Oncology

## 2015-06-30 DIAGNOSIS — D72829 Elevated white blood cell count, unspecified: Secondary | ICD-10-CM

## 2015-06-30 DIAGNOSIS — R509 Fever, unspecified: Secondary | ICD-10-CM

## 2015-06-30 DIAGNOSIS — Z510 Encounter for antineoplastic radiation therapy: Secondary | ICD-10-CM | POA: Insufficient documentation

## 2015-06-30 DIAGNOSIS — C9002 Multiple myeloma in relapse: Secondary | ICD-10-CM | POA: Insufficient documentation

## 2015-06-30 DIAGNOSIS — N189 Chronic kidney disease, unspecified: Secondary | ICD-10-CM

## 2015-06-30 DIAGNOSIS — C9000 Multiple myeloma not having achieved remission: Secondary | ICD-10-CM

## 2015-06-30 LAB — PROTEIN ELECTROPHORESIS, SERUM
A/G Ratio: 0.3 — ABNORMAL LOW (ref 0.7–1.7)
ALBUMIN ELP: 1.3 g/dL — AB (ref 2.9–4.4)
Alpha-1-Globulin: 0.6 g/dL — ABNORMAL HIGH (ref 0.0–0.4)
Alpha-2-Globulin: 1.6 g/dL — ABNORMAL HIGH (ref 0.4–1.0)
Beta Globulin: 0.6 g/dL — ABNORMAL LOW (ref 0.7–1.3)
GAMMA GLOBULIN: 2 g/dL — AB (ref 0.4–1.8)
Globulin, Total: 4.9 g/dL — ABNORMAL HIGH (ref 2.2–3.9)
M-SPIKE, %: 1.8 g/dL — AB
TOTAL PROTEIN ELP: 6.2 g/dL (ref 6.0–8.5)

## 2015-06-30 LAB — HEMOGLOBIN AND HEMATOCRIT, BLOOD
HCT: 21 % — ABNORMAL LOW (ref 39.0–52.0)
HEMATOCRIT: 28.7 % — AB (ref 39.0–52.0)
HEMOGLOBIN: 9.5 g/dL — AB (ref 13.0–17.0)
Hemoglobin: 6.8 g/dL — CL (ref 13.0–17.0)

## 2015-06-30 LAB — CBC WITH DIFFERENTIAL/PLATELET
BASOS PCT: 0 %
Basophils Absolute: 0 10*3/uL (ref 0.0–0.1)
EOS PCT: 0 %
Eosinophils Absolute: 0 10*3/uL (ref 0.0–0.7)
HCT: 21.1 % — ABNORMAL LOW (ref 39.0–52.0)
Hemoglobin: 6.8 g/dL — CL (ref 13.0–17.0)
LYMPHS ABS: 2.1 10*3/uL (ref 0.7–4.0)
Lymphocytes Relative: 13 %
MCH: 30.2 pg (ref 26.0–34.0)
MCHC: 32.2 g/dL (ref 30.0–36.0)
MCV: 93.8 fL (ref 78.0–100.0)
MONO ABS: 2 10*3/uL — AB (ref 0.1–1.0)
Monocytes Relative: 12 %
NEUTROS ABS: 12.2 10*3/uL — AB (ref 1.7–7.7)
Neutrophils Relative %: 75 %
PLATELETS: 413 10*3/uL — AB (ref 150–400)
RBC: 2.25 MIL/uL — ABNORMAL LOW (ref 4.22–5.81)
RDW: 14.8 % (ref 11.5–15.5)
WBC: 16.3 10*3/uL — ABNORMAL HIGH (ref 4.0–10.5)

## 2015-06-30 LAB — KAPPA/LAMBDA LIGHT CHAINS
KAPPA FREE LGHT CHN: 145.75 mg/L — AB (ref 3.30–19.40)
Kappa, lambda light chain ratio: 17.52 — ABNORMAL HIGH (ref 0.26–1.65)
Lambda free light chains: 8.32 mg/L (ref 5.71–26.30)

## 2015-06-30 LAB — BASIC METABOLIC PANEL
Anion gap: 12 (ref 5–15)
BUN: 79 mg/dL — AB (ref 6–20)
CALCIUM: 8.4 mg/dL — AB (ref 8.9–10.3)
CO2: 19 mmol/L — ABNORMAL LOW (ref 22–32)
Chloride: 103 mmol/L (ref 101–111)
Creatinine, Ser: 4.05 mg/dL — ABNORMAL HIGH (ref 0.61–1.24)
GFR calc Af Amer: 20 mL/min — ABNORMAL LOW (ref >60)
GFR, EST NON AFRICAN AMERICAN: 17 mL/min — AB (ref >60)
GLUCOSE: 146 mg/dL — AB (ref 65–99)
Potassium: 3.5 mmol/L (ref 3.5–5.1)
Sodium: 134 mmol/L — ABNORMAL LOW (ref 135–145)

## 2015-06-30 LAB — CULTURE, BLOOD (ROUTINE X 2)
CULTURE: NO GROWTH
CULTURE: NO GROWTH

## 2015-06-30 LAB — PREPARE RBC (CROSSMATCH)

## 2015-06-30 MED ORDER — MORPHINE SULFATE (PF) 2 MG/ML IV SOLN: 1.0000 mg | INTRAVENOUS | Status: DC | PRN

## 2015-06-30 MED ORDER — DIATRIZOATE MEGLUMINE & SODIUM 66-10 % PO SOLN: 15.0000 mL | ORAL | Status: DC | PRN

## 2015-06-30 MED ORDER — SODIUM CHLORIDE 0.9 % IV SOLN
Freq: Once | INTRAVENOUS | Status: AC
  Administered 2015-06-30: 03:00:00 via INTRAVENOUS

## 2015-06-30 MED ORDER — SODIUM CHLORIDE 0.9 % IV SOLN
INTRAVENOUS | Status: DC
  Administered 2015-07-01: 17:00:00 via INTRAVENOUS

## 2015-06-30 NOTE — Progress Notes (Signed)
Nutrition Follow-up  DOCUMENTATION CODES:   Not applicable  INTERVENTION:  - Continue Ensure Enlive TID - Continue to encourage PO intakes of meals and supplements - RD will continue to monitor for additional needs  NUTRITION DIAGNOSIS:   Increased nutrient needs related to cancer and cancer related treatments as evidenced by estimated needs. -ongoing  GOAL:   Patient will meet greater than or equal to 90% of their needs -variably met  MONITOR:   PO intake, Supplement acceptance, Labs, Weight trends, Skin, I & O's  ASSESSMENT:   Pt with medical history significant of Multiple Myeloma, CKD 3, back pain, currently undergoing chemotherapy, reports he was started on a new chemotherapeutic agent recently, last chemo was last week, went to the cancer center with Nausea, Vomiting, diarrhea, fever and chills x4days and was sent to the ER.  5/30 Per chart review, pt ate 75% of lunch yesterday and 100% of breakfast this AM. Pt mainly resting during RD visit; notes indicate that pt is Spanish-speaking but can understand and respond to some [place]. Spoke with daughter, who is at bedside. She states that pt's appetite fluctuates with some days begin poor and some days he eats well. Pt has not commented about taste alterations to daughter in the past few days. Daughter was not present for breakfast this AM, but RN at bedside states that pt ate ~90% of meal and drank an Ensure without issue.  Pt likely variably meeting needs. Will continue current regimen and monitor for additional needs. Medications reviewed. Labs reviewed; Na: 134 mmol/L, BUN/creatinine elevated and trending up, Ca: 8.4 mg/dL, GFR: 17.    5/24 - Pt does not speak English.  - Spoke to visitor at bedside who translated.  - Pt eats once per day. Pt has not been eating well for the past 4 days d/t N/V.  - Pt has had a poor appetite for at least 6 months.  - Pt started chemo 1.5 years ago and has had altered taste and poor  appetite since then. - Pt reports most foods tasting bland.  - Asked pt if he would prefer Ensure or Boost Breeze and he requested Ensure.  - Intern discussed pouring it over ice since cold foods can be more tolerable with poor appetite and altered taste. - Pt does not drink nutrition supplements at home but is amenable to trying Ensure. Will order TID.  - Pt reports losing 8 lbs since December and a UBW of 154 lbs. Per chart, pt has experienced a 4.5% weight loss since 03/05/15. This is not significant for time frame. - NFPE: no muscle or fat depletion, no edema.    Diet Order:  Diet regular Room service appropriate?: Yes; Fluid consistency:: Thin  Skin:  Reviewed, no issues  Last BM:  5/30  Height:   Ht Readings from Last 1 Encounters:  06/26/15 5' 2"  (1.575 m)    Weight:   Wt Readings from Last 1 Encounters:  06/30/15 150 lb 2.1 oz (68.1 kg)    Ideal Body Weight:  53.64 kg (kg)  BMI:  Body mass index is 27.45 kg/(m^2).  Estimated Nutritional Needs:   Kcal:  2000-2200  Protein:  100-110  Fluid:  2 L  EDUCATION NEEDS:   No education needs identified at this time     Jarome Matin, RD, LDN Inpatient Clinical Dietitian Pager # [phone] After hours/weekend pager # [phone]

## 2015-06-30 NOTE — Consult Note (Signed)
Date of Admission:  06/23/2015  Date of Consult:  06/30/2015  Reason for Consult: FUO,  Referring Physician: Dr. Tyrell Antonio   HPI: Charles [name] is an 40 y.o. male with hx of Multiple myeloma, CKD, admitted with nausea, vomiting back pain. He has been receiving chemotherapy but last dose was a week prior to admission. He was admitted and workup begun. Urine cx mx species (but no urinary complaints anyway).  CT renal study Subtle lucencies over the T7, T8 and T9 vertebral bodies not wellseen previously and likely due to patient's known multiple myeloma. No compression fracture., pleural effusion. CXR with atelectasis vs infiltrate. He has been on vancomycin, prophylactic acyclovir, then cefepime but without clear cut target for these antibiotics. MRI of the brain showed pathology c/w PRESS. He has had Temp spike since to 103. All blood culturs have been negative. He is in ICU and currently hypertensive. WBC i up but he does have chronic leukocytosis in part due to his multiple myeloma.  Main complaint at present in his back pain.     Past Medical History  Diagnosis Date  . IgG myeloma ([place])   . CKD (chronic kidney disease), stage III   . Hypercholesterolemia   . Lytic bone lesions on xray     History reviewed. No pertinent past surgical history.  Social History:  reports that he has never smoked. He has never used smokeless tobacco. He reports that he does not drink alcohol or use illicit drugs.   Family History  Problem Relation Age of Onset  . Diabetes Father     Allergies  Allergen Reactions  . Ciprofloxacin Rash  . Mobic [Meloxicam] Rash     Medications: I have reviewed patients current medications as documented in Epic Anti-infectives    Start     Dose/Rate Route Frequency Ordered Stop   06/30/15 1600  vancomycin (VANCOCIN) IVPB 750 mg/150 ml premix  Status:  Discontinued     750 mg 150 mL/hr over 60 Minutes Intravenous Every 24 hours 06/29/15 1742  06/30/15 0642   06/27/15 1000  acyclovir (ZOVIRAX) tablet 400 mg     400 mg Oral Daily 06/27/15 0502     06/25/15 1800  ceFEPIme (MAXIPIME) 1 g in dextrose 5 % 50 mL IVPB     1 g 100 mL/hr over 30 Minutes Intravenous Every 24 hours 06/25/15 1604 07/03/15 1759   06/25/15 1700  vancomycin (VANCOCIN) IVPB 1000 mg/200 mL premix  Status:  Discontinued     1,000 mg 200 mL/hr over 60 Minutes Intravenous Every 48 hours 06/25/15 1613 06/29/15 1742   06/24/15 1000  acyclovir (ZOVIRAX) 200 MG capsule 400 mg  Status:  Discontinued     400 mg Oral Daily 06/23/15 1858 06/27/15 0502   06/24/15 0800  cefTRIAXone (ROCEPHIN) 1 g in dextrose 5 % 50 mL IVPB  Status:  Discontinued     1 g 100 mL/hr over 30 Minutes Intravenous Every 24 hours 06/23/15 1858 06/25/15 1603   06/23/15 1615  cefTRIAXone (ROCEPHIN) 1 g in dextrose 5 % 50 mL IVPB     1 g 100 mL/hr over 30 Minutes Intravenous  Once 06/23/15 1602 06/23/15 1638         ROS:  as in HPI otherwise remainder of 12 point Review of Systems is negative    Blood pressure 142/62, pulse 98, temperature 98.1 F (36.7 C), temperature source Oral, resp. rate 15, height 5' 2"  (1.575 m), weight 150 lb 2.1 oz (68.1  kg), SpO2 92 %. General: Alert and awake, oriented x3, not in any acute distress. HEENT: anicteric sclera,  EOMI, oropharynx clear and without exudate Cardiovascular: regular rate, normal r,  no murmur rubs or gallops Pulmonary: clear to auscultation bilaterally, no wheezing, rales or rhonchi Gastrointestinal: soft nontender, nondistended, normal bowel sounds, Musculoskeletal: no  Deformities. Skin, soft tissue: no rashes Neuro: nonfocal, strength and sensation intact   Results for orders placed or performed during the hospital encounter of 06/23/15 (from the past 48 hour(s))  CBC with Differential     Status: Abnormal   Collection Time: 06/29/15  3:11 AM  Result Value Ref Range   WBC 17.4 (H) 4.0 - 10.5 K/uL   RBC 2.25 (L) 4.22 - 5.81 MIL/uL     Hemoglobin 7.0 (L) 13.0 - 17.0 g/dL    Comment: REPEATED TO VERIFY   HCT 21.7 (L) 39.0 - 52.0 %   MCV 96.4 78.0 - 100.0 fL   MCH 31.1 26.0 - 34.0 pg   MCHC 32.3 30.0 - 36.0 g/dL   RDW 15.1 11.5 - 15.5 %   Platelets 434 (H) 150 - 400 K/uL   Neutrophils Relative % 77 %   Lymphocytes Relative 12 %   Monocytes Relative 10 %   Eosinophils Relative 1 %   Basophils Relative 0 %   Neutro Abs 13.4 (H) 1.7 - 7.7 K/uL   Lymphs Abs 2.1 0.7 - 4.0 K/uL   Monocytes Absolute 1.7 (H) 0.1 - 1.0 K/uL   Eosinophils Absolute 0.2 0.0 - 0.7 K/uL   Basophils Absolute 0.0 0.0 - 0.1 K/uL   WBC Morphology DOHLE BODIES   Basic metabolic panel     Status: Abnormal   Collection Time: 06/29/15  3:11 AM  Result Value Ref Range   Sodium 137 135 - 145 mmol/L   Potassium 3.3 (L) 3.5 - 5.1 mmol/L   Chloride 109 101 - 111 mmol/L   CO2 18 (L) 22 - 32 mmol/L   Glucose, Bld 123 (H) 65 - 99 mg/dL   BUN 69 (H) 6 - 20 mg/dL   Creatinine, Ser 3.53 (H) 0.61 - 1.24 mg/dL   Calcium 8.3 (L) 8.9 - 10.3 mg/dL   GFR calc non Af Amer 20 (L) >60 mL/min   GFR calc Af Amer 23 (L) >60 mL/min    Comment: (NOTE) The eGFR has been calculated using the CKD EPI equation. This calculation has not been validated in all clinical situations. eGFR's persistently <60 mL/min signify possible Chronic Kidney Disease.    Anion gap 10 5 - 15  Vancomycin, trough     Status: None   Collection Time: 06/29/15  3:44 PM  Result Value Ref Range   Vancomycin Tr 14 10.0 - 20.0 ug/mL  Hemoglobin and hematocrit, blood     Status: Abnormal   Collection Time: 06/30/15 12:01 AM  Result Value Ref Range   Hemoglobin 6.8 (LL) 13.0 - 17.0 g/dL    Comment: RESULT REPEATED AND VERIFIED CRITICAL RESULT CALLED TO, READ BACK BY AND VERIFIED WITH: S HOGUERN @ 0024 ON 06/30/15 BY C DAVIS    HCT 21.0 (L) 39.0 - 52.0 %  Prepare RBC     Status: None   Collection Time: 06/30/15  1:00 AM  Result Value Ref Range   Order Confirmation ORDER PROCESSED BY BLOOD  BANK   Type and screen [place]     Status: None (Preliminary result)   Collection Time: 06/30/15  1:04 AM  Result Value  Ref Range   ABO/RH(D) O POS    Antibody Screen NEG    Sample Expiration 07/03/2015    Unit Number [id number]    Blood Component Type RBC, LR IRR    Unit division 00    Status of Unit ISSUED    Transfusion Status OK TO TRANSFUSE    Crossmatch Result Compatible   CBC with Differential     Status: Abnormal   Collection Time: 06/30/15  1:08 AM  Result Value Ref Range   WBC 16.3 (H) 4.0 - 10.5 K/uL   RBC 2.25 (L) 4.22 - 5.81 MIL/uL   Hemoglobin 6.8 (LL) 13.0 - 17.0 g/dL    Comment: CRITICAL VALUE NOTED.  VALUE IS CONSISTENT WITH PREVIOUSLY REPORTED AND CALLED VALUE.   HCT 21.1 (L) 39.0 - 52.0 %   MCV 93.8 78.0 - 100.0 fL   MCH 30.2 26.0 - 34.0 pg   MCHC 32.2 30.0 - 36.0 g/dL   RDW 14.8 11.5 - 15.5 %   Platelets 413 (H) 150 - 400 K/uL   Neutrophils Relative % 75 %   Lymphocytes Relative 13 %   Monocytes Relative 12 %   Eosinophils Relative 0 %   Basophils Relative 0 %   Neutro Abs 12.2 (H) 1.7 - 7.7 K/uL   Lymphs Abs 2.1 0.7 - 4.0 K/uL   Monocytes Absolute 2.0 (H) 0.1 - 1.0 K/uL   Eosinophils Absolute 0.0 0.0 - 0.7 K/uL   Basophils Absolute 0.0 0.0 - 0.1 K/uL   WBC Morphology MILD LEFT SHIFT (1-5% METAS, OCC MYELO, OCC BANDS)   Basic metabolic panel     Status: Abnormal   Collection Time: 06/30/15  1:08 AM  Result Value Ref Range   Sodium 134 (L) 135 - 145 mmol/L   Potassium 3.5 3.5 - 5.1 mmol/L   Chloride 103 101 - 111 mmol/L   CO2 19 (L) 22 - 32 mmol/L   Glucose, Bld 146 (H) 65 - 99 mg/dL   BUN 79 (H) 6 - 20 mg/dL   Creatinine, Ser 4.05 (H) 0.61 - 1.24 mg/dL   Calcium 8.4 (L) 8.9 - 10.3 mg/dL   GFR calc non Af Amer 17 (L) >60 mL/min   GFR calc Af Amer 20 (L) >60 mL/min    Comment: (NOTE) The eGFR has been calculated using the CKD EPI equation. This calculation has not been validated in all clinical situations. eGFR's  persistently <60 mL/min signify possible Chronic Kidney Disease.    Anion gap 12 5 - 15  Hemoglobin and hematocrit, blood     Status: Abnormal   Collection Time: 06/30/15 10:23 AM  Result Value Ref Range   Hemoglobin 9.5 (L) 13.0 - 17.0 g/dL    Comment: POST TRANSFUSION SPECIMEN DELTA CHECK NOTED    HCT 28.7 (L) 39.0 - 52.0 %   @BRIEFLABTABLE (sdes,specrequest,cult,reptstatus)   ) Recent Results (from the past 720 hour(s))  Urine culture     Status: Abnormal   Collection Time: 06/23/15  2:30 PM  Result Value Ref Range Status   Specimen Description URINE, CLEAN CATCH  Final   Special Requests NONE  Final   Culture MULTIPLE SPECIES PRESENT, SUGGEST RECOLLECTION (A)  Final   Report Status 06/25/2015 FINAL  Final  Blood Culture (routine x 2)     Status: None   Collection Time: 06/23/15  2:45 PM  Result Value Ref Range Status   Specimen Description BLOOD RIGHT ANTECUBITAL  Final   Special Requests BOTTLES DRAWN AEROBIC AND ANAEROBIC 5 CC  EA  Final   Culture   Final    NO GROWTH 5 DAYS Performed at [facility]    Report Status 06/28/2015 FINAL  Final  Blood Culture (routine x 2)     Status: None   Collection Time: 06/23/15  3:29 PM  Result Value Ref Range Status   Specimen Description BLOOD RIGHT HAND  Final   Special Requests BOTTLES DRAWN AEROBIC ONLY 9ML  Final   Culture   Final    NO GROWTH 5 DAYS Performed at [facility]    Report Status 06/28/2015 FINAL  Final  Culture, blood (routine x 2) Call MD if unable to obtain prior to antibiotics being given     Status: None   Collection Time: 06/25/15  5:05 PM  Result Value Ref Range Status   Specimen Description BLOOD RIGHT ARM  Final   Special Requests BOTTLES DRAWN AEROBIC AND ANAEROBIC 8 CC EA  Final   Culture   Final    NO GROWTH 5 DAYS Performed at [facility]    Report Status 06/30/2015 FINAL  Final  Culture, blood (routine x 2) Call MD if unable to obtain prior to antibiotics being given      Status: None   Collection Time: 06/25/15  5:08 PM  Result Value Ref Range Status   Specimen Description BLOOD RIGHT HAND  Final   Special Requests BOTTLES DRAWN AEROBIC AND ANAEROBIC 10 CC EA  Final   Culture   Final    NO GROWTH 5 DAYS Performed at [facility]    Report Status 06/30/2015 FINAL  Final  Urine culture     Status: None   Collection Time: 06/25/15  9:17 PM  Result Value Ref Range Status   Specimen Description URINE, CLEAN CATCH  Final   Special Requests NONE  Final   Culture NO GROWTH Performed at [facility]   Final   Report Status 06/27/2015 FINAL  Final  MRSA PCR Screening     Status: None   Collection Time: 06/26/15  5:00 PM  Result Value Ref Range Status   MRSA by PCR NEGATIVE NEGATIVE Final    Comment:        The GeneXpert MRSA Assay (FDA approved for NASAL specimens only), is one component of a comprehensive MRSA colonization surveillance program. It is not intended to diagnose MRSA infection nor to guide or monitor treatment for MRSA infections.      Impression/Recommendation  Principal Problem:   AKI (acute kidney injury) ([place]) Active Problems:   Multiple myeloma ([facility])   CKD (chronic kidney disease) stage 3, GFR 30-59 ml/min   Nausea with vomiting   Blurry vision, bilateral   HTN (hypertension), malignant   PRES (posterior reversible encephalopathy syndrome)   Charles [name] is a 40 y.o. male with  Multiple myeloma sp chemotherapy initation admitted with nausea, vomiting, with possible PRESS leukocytosis and now fevers on antbiotics.  #1 FUO:  I will check a CT chest abdomen and pelvis with ORAL contrast alone given his acute on chronic renal failure to look for cause of fevers  Certainly multiple myeloma itself can cause fevers  If CT scan is unrevealing and not compelling for an empyema or abscess I will dc his cefepime  If he is still fevering will get MRI of the T spine and L spine without  contrast  .   06/30/2015, 4:12 PM   Thank you so much for this interesting consult  [facility] [place] [phone] (pager) [phone] (office) 06/30/2015, 4:12 PM  [place] 06/30/2015, 4:12 PM    d
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
## 2015-06-30 NOTE — Progress Notes (Signed)
LCSW consulted for "patient does not have insurance". Will defer to CM for possible assistance with PCP. Also refer to financial counselors for assistance with medicaid if applicable.  No CSW needs at this time.  Will sign off. If needs arise, please re-consult.  Lane Hacker, MSW Clinical Social Work: [employer] [phone]

## 2015-06-30 NOTE — Progress Notes (Signed)
Interval History:                                                                                                                      Charles [name] is an 40 y.o. 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 No new neurological symptoms. He has pain symptoms from multiple myeloma, receiving opioids.  Continues to be mildly elevated blood pressure, managed with the primary hospitalist team. Denies any new neurological symptoms otherwise    Past Medical History: Past Medical History  Diagnosis Date  . 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 CKD (chronic kidney disease), stage III   . Hypercholesterolemia   . Lytic bone lesions on xray     History reviewed. No pertinent past surgical history.  Family History: Family History  Problem Relation Age of Onset  . Diabetes Father     Social History:   reports that he has never smoked. He has never used smokeless tobacco. He reports that he does not drink alcohol or use illicit drugs.  Allergies:  Allergies  Allergen Reactions  . Ciprofloxacin Rash  . Mobic [Meloxicam] Rash     Medications:                                                                                                                         Current facility-administered medications:  .  0.9 %  sodium chloride infusion, 250 mL, Intravenous, Once, [name], MD, Stopped at 06/23/15 2056 .  0.9 %  sodium chloride infusion, , Intravenous, Continuous, Belkys A Regalado, MD, Stopped at 06/30/15 0800 .  0.9 %  sodium chloride infusion, , Intravenous, Continuous, Belkys A Regalado, MD, Stopped at 06/30/15 0800 .  0.9 %  sodium chloride infusion, , Intravenous, Continuous, Belkys A Regalado, MD, Last Rate: 50 mL/hr at 06/30/15 2100 .  acetaminophen (TYLENOL) tablet 650 mg, 650 mg, Oral, Q6H PRN, Belkys A Regalado, MD, 650 mg at 06/29/15 1502 .  acyclovir (ZOVIRAX) tablet 400 mg, 400 mg, Oral, Daily, Belkys A Regalado, MD, 400 mg at 06/30/15 1100 .  ceFEPIme (MAXIPIME) 1 g in dextrose 5 % 50  mL IVPB, 1 g, Intravenous, Q24H, Belkys A Regalado, MD, 1 g at 06/30/15 1732 .  diatrizoate meglumine-sodium (GASTROGRAFIN) 66-10 % solution 15 mL, 15 mL, Oral, PRN, Belkys A Regalado, MD .  docusate sodium (COLACE) capsule 100 mg, 100 mg, Oral, Daily, [name], MD, 100 mg at 06/30/15 0900 .  enoxaparin (LOVENOX) injection 30 mg,  30 mg, Subcutaneous, Q24H, Domenic Polite, MD, 30 mg at 06/30/15 1959 .  feeding supplement (ENSURE ENLIVE) (ENSURE ENLIVE) liquid 237 mL, 237 mL, Oral, TID BM, Clayton Bibles, RD, 237 mL at 06/30/15 1400 .  heparin lock flush 100 unit/mL, 500 Units, Intracatheter, Daily PRN, [name], MD .  heparin lock flush 100 unit/mL, 250 Units, Intracatheter, PRN, [name], MD .  hydrALAZINE (APRESOLINE) tablet 75 mg, 75 mg, Oral, Q8H, Belkys A Regalado, MD, 75 mg at 06/30/15 2126 .  HYDROcodone-acetaminophen (NORCO) 10-325 MG per tablet 1-2 tablet, 1-2 tablet, Oral, Q4H PRN, [name], MD, 1 tablet at 06/29/15 1358 .  metoprolol tartrate (LOPRESSOR) tablet 75 mg, 75 mg, Oral, BID, Belkys A Regalado, MD, 75 mg at 06/30/15 2126 .  mirtazapine (REMERON) tablet 15 mg, 15 mg, Oral, QHS, [name], MD, 15 mg at 06/30/15 2126 .  morphine (MS CONTIN) 12 hr tablet 15 mg, 15 mg, Oral, Q12H, [name], MD, 15 mg at 06/30/15 2125 .  morphine 2 MG/ML injection 1-2 mg, 1-2 mg, Intravenous, Q3H PRN, Belkys A Regalado, MD .  nicardipine (CARDENE) 24m in 0.83% saline 2060mIV DOUBLE STRENGTH infusion (0.2 mg/ml), 3-15 mg/hr, Intravenous, Continuous, Belkys A Regalado, MD, Last Rate: 50 mL/hr at 06/30/15 1842, 10 mg/hr at 06/30/15 1842 .  polyethylene glycol (MIRALAX / GLYCOLAX) packet 17 g, 17 g, Oral, Daily, GuChauncey CruelMD, 17 g at 06/30/15 0900 .  prochlorperazine (COMPAZINE) injection 10 mg, 10 mg, Intravenous, TID PRN, Belkys A Regalado, MD, 10 mg at 06/30/15 1125 .  sodium chloride flush (NS) 0.9 % injection 10 mL, 10 mL, Intracatheter,  PRN, GuChauncey CruelMD .  sodium chloride flush (NS) 0.9 % injection 3 mL, 3 mL, Intracatheter, PRN, GuChauncey CruelMD   Neurologic Examination:                                                                                                     Today's Vitals   06/30/15 1800 06/30/15 1821 06/30/15 1900 06/30/15 2000  BP: 129/59   121/41  Pulse:      Temp:    98.5 F (36.9 C)  TempSrc:    Oral  Resp: 17  17 19   Height:      Weight:      SpO2: 93%  94% 95%  PainSc:  Asleep      Not in distress, alert, oriented 4. 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 Full motor strength with the exception of difficulty with left shoulder abduction due to shoulder pain, and also right hip pain, likely from multiple myeloma.   Lab Results: Basic Metabolic Panel:Secondary to labile hypertension   Recent Labs Lab 06/26/15 0701 06/27/15 0328 06/28/15 0405 06/29/15 0311 06/30/15 0108  NA 137 135 134* 137 134*  K 3.7 3.5 3.3* 3.3* 3.5  CL 108 106 103 109 103  CO2 21* 19* 20* 18* 19*  GLUCOSE 111* 194* 107* 123* 146*  BUN 47* 53* 65* 69* 79*  CREATININE 3.79* 3.73* 3.84* 3.53* 4.05*  CALCIUM 10.4*  9.4 8.5* 8.3* 8.4*    Liver Function Tests:  Recent Labs Lab 06/24/15 0338 06/27/15 0328 06/28/15 0405  AST 10* 10* 14*  ALT 18 11* 14*  ALKPHOS 118 111 131*  BILITOT 0.3 0.7 0.9  PROT 6.7 7.0 6.5  ALBUMIN 1.6* 1.6* 1.6*   No results for input(s): LIPASE, AMYLASE in the last 168 hours. No results for input(s): AMMONIA in the last 168 hours.  CBC:  Recent Labs Lab 06/26/15 0701 06/27/15 0328 06/28/15 0405 06/29/15 0311 06/30/15 0001 06/30/15 0108 06/30/15 1023  WBC 16.1* 12.0* 19.3* 17.4*  --  16.3*  --   NEUTROABS  --  10.5* 15.7* 13.4*  --  12.2*  --   HGB 8.5* 7.4* 7.2* 7.0* 6.8* 6.8* 9.5*  HCT 24.9* 23.0* 22.1* 21.7* 21.0* 21.1* 28.7*  MCV 93.3 93.9 94.8 96.4  --  93.8  --   PLT 648* 632* 557* 434*  --  413*  --     Cardiac Enzymes: No results for input(s):  CKTOTAL, CKMB, CKMBINDEX, TROPONINI in the last 168 hours.  Lipid Panel: No results for input(s): CHOL, TRIG, HDL, CHOLHDL, VLDL, LDLCALC in the last 168 hours.  CBG: No results for input(s): GLUCAP in the last 168 hours.  Microbiology: Results for orders placed or performed during the hospital encounter of 06/23/15  Urine culture     Status: Abnormal   Collection Time: 06/23/15  2:30 PM  Result Value Ref Range Status   Specimen Description URINE, CLEAN CATCH  Final   Special Requests NONE  Final   Culture MULTIPLE SPECIES PRESENT, SUGGEST RECOLLECTION (A)  Final   Report Status 06/25/2015 FINAL  Final  Blood Culture (routine x 2)     Status: None   Collection Time: 06/23/15  2:45 PM  Result Value Ref Range Status   Specimen Description BLOOD RIGHT ANTECUBITAL  Final   Special Requests BOTTLES DRAWN AEROBIC AND ANAEROBIC 5 CC EA  Final   Culture   Final    NO GROWTH 5 DAYS Performed at [facility]    Report Status 06/28/2015 FINAL  Final  Blood Culture (routine x 2)     Status: None   Collection Time: 06/23/15  3:29 PM  Result Value Ref Range Status   Specimen Description BLOOD RIGHT HAND  Final   Special Requests BOTTLES DRAWN AEROBIC ONLY 9ML  Final   Culture   Final    NO GROWTH 5 DAYS Performed at [facility]    Report Status 06/28/2015 FINAL  Final  Culture, blood (routine x 2) Call MD if unable to obtain prior to antibiotics being given     Status: None   Collection Time: 06/25/15  5:05 PM  Result Value Ref Range Status   Specimen Description BLOOD RIGHT ARM  Final   Special Requests BOTTLES DRAWN AEROBIC AND ANAEROBIC 8 CC EA  Final   Culture   Final    NO GROWTH 5 DAYS Performed at [facility]    Report Status 06/30/2015 FINAL  Final  Culture, blood (routine x 2) Call MD if unable to obtain prior to antibiotics being given     Status: None   Collection Time: 06/25/15  5:08 PM  Result Value Ref Range Status   Specimen Description BLOOD  RIGHT HAND  Final   Special Requests BOTTLES DRAWN AEROBIC AND ANAEROBIC 10 CC EA  Final   Culture   Final    NO GROWTH 5 DAYS Performed at [facility]    Report Status 06/30/2015 FINAL  Final  Urine culture     Status: None   Collection Time: 06/25/15  9:17 PM  Result Value Ref Range Status   Specimen Description URINE, CLEAN CATCH  Final   Special Requests NONE  Final   Culture NO GROWTH Performed at [facility]   Final   Report Status 06/27/2015 FINAL  Final  MRSA PCR Screening     Status: None   Collection Time: 06/26/15  5:00 PM  Result Value Ref Range Status   MRSA by PCR NEGATIVE NEGATIVE Final    Comment:        The GeneXpert MRSA Assay (FDA approved for NASAL specimens only), is one component of a comprehensive MRSA colonization surveillance program. It is not intended to diagnose MRSA infection nor to guide or monitor treatment for MRSA infections.     Imaging: Ct Abdomen Pelvis Wo Contrast  06/30/2015  CLINICAL DATA:  History of multiple myeloma with chronic kidney disease stage 3 and back pain currently undergoing chemotherapy. Nausea, vomiting, diarrhea, fever and chills four days. EXAM: CT CHEST, ABDOMEN AND PELVIS WITHOUT CONTRAST TECHNIQUE: Multidetector CT imaging of the chest, abdomen and pelvis was performed following the standard protocol without IV contrast. COMPARISON:  CT 06/23/2015, 02/03/2014 and 01/30/2014 FINDINGS: CT CHEST Lungs are adequately inflated demonstrate a small amount of bilateral pleural fluid left greater than right with associated atelectasis in the posterior lung bases left greater than right. There are new multiple bilateral well-defined focal pleural masses right greater than left with the largest over the right apex measuring 2.8 x 5.2 cm. These likely represent spread of patient's known multiple myeloma. Airways are normal. Heart is normal size. No definite mediastinal or hilar adenopathy. Small amount contrast within  the mid to distal esophagus likely due to dysmotility versus reflux. Remaining mediastinal structures are unremarkable. There are couple hypodense thyroid nodules with the largest measuring 1.2 cm over the left lobe. Patient's known destructive sternal manubrial lesion is unchanged. There is subtle increased lucency over the body of the sternum which is new likely spread of patient's known myeloma. There are several lucencies over the thoracic spine with the most prominent over the T9 vertebral body. CT ABDOMEN AND PELVIS The liver, spleen, gallbladder, pancreas and adrenal glands are within normal. Kidneys normal size without hydronephrosis or nephrolithiasis. Stable exophytic hypodensity over the mid pole cortex of the left kidney likely a cyst. Ureters are normal. Stomach is within normal. Small bowel is normal. Appendix is normal. Subtle diverticulosis of the colon most prominent over the right colon. There is no significant free fluid or focal inflammatory change. Few small periaortic lymph nodes. Vascular structures are unremarkable. Pelvic images demonstrate the bladder, prostate and rectum to be within normal. Ill-defined low-attenuation over the right iliac bone likely a myelomatous lesion. Ill-defined lytic process over the right femoral neck spanning nearly the entire width of the femoral neck increasing risk of pathologic fracture. Bone island over the left sacrum. IMPRESSION: No acute findings in the chest, abdomen or pelvis. Lytic lesions within the sternum, spine, right iliac bone and right femoral neck compatible with patient's known myeloma. Note that patient is at risk for pathologic fracture through the right femoral neck. Multiple new bilateral focal pleural masses with the largest over the right apex measuring 2.8 x 5.2 cm likely metastatic disease from patient's multiple myeloma. Small amount of bilateral pleural fluid bibasilar consolidation most typical of atelectasis. Minimal diverticulosis  of the  colon. Stable 1.3 cm exophytic hypodensity over the mid pole left kidney likely a cyst. Couple small hypodense thyroid nodules with the larger over the left lobe measuring 1.2 cm. Recommend follow-up as clinically indicated. Electronically Signed   By: Marin Olp M.D.   On: 06/30/2015 17:24   Ct Chest Wo Contrast  06/30/2015  CLINICAL DATA:  History of multiple myeloma with chronic kidney disease stage 3 and back pain currently undergoing chemotherapy. Nausea, vomiting, diarrhea, fever and chills four days. EXAM: CT CHEST, ABDOMEN AND PELVIS WITHOUT CONTRAST TECHNIQUE: Multidetector CT imaging of the chest, abdomen and pelvis was performed following the standard protocol without IV contrast. COMPARISON:  CT 06/23/2015, 02/03/2014 and 01/30/2014 FINDINGS: CT CHEST Lungs are adequately inflated demonstrate a small amount of bilateral pleural fluid left greater than right with associated atelectasis in the posterior lung bases left greater than right. There are new multiple bilateral well-defined focal pleural masses right greater than left with the largest over the right apex measuring 2.8 x 5.2 cm. These likely represent spread of patient's known multiple myeloma. Airways are normal. Heart is normal size. No definite mediastinal or hilar adenopathy. Small amount contrast within the mid to distal esophagus likely due to dysmotility versus reflux. Remaining mediastinal structures are unremarkable. There are couple hypodense thyroid nodules with the largest measuring 1.2 cm over the left lobe. Patient's known destructive sternal manubrial lesion is unchanged. There is subtle increased lucency over the body of the sternum which is new likely spread of patient's known myeloma. There are several lucencies over the thoracic spine with the most prominent over the T9 vertebral body. CT ABDOMEN AND PELVIS The liver, spleen, gallbladder, pancreas and adrenal glands are within normal. Kidneys normal size without  hydronephrosis or nephrolithiasis. Stable exophytic hypodensity over the mid pole cortex of the left kidney likely a cyst. Ureters are normal. Stomach is within normal. Small bowel is normal. Appendix is normal. Subtle diverticulosis of the colon most prominent over the right colon. There is no significant free fluid or focal inflammatory change. Few small periaortic lymph nodes. Vascular structures are unremarkable. Pelvic images demonstrate the bladder, prostate and rectum to be within normal. Ill-defined low-attenuation over the right iliac bone likely a myelomatous lesion. Ill-defined lytic process over the right femoral neck spanning nearly the entire width of the femoral neck increasing risk of pathologic fracture. Bone island over the left sacrum. IMPRESSION: No acute findings in the chest, abdomen or pelvis. Lytic lesions within the sternum, spine, right iliac bone and right femoral neck compatible with patient's known myeloma. Note that patient is at risk for pathologic fracture through the right femoral neck. Multiple new bilateral focal pleural masses with the largest over the right apex measuring 2.8 x 5.2 cm likely metastatic disease from patient's multiple myeloma. Small amount of bilateral pleural fluid bibasilar consolidation most typical of atelectasis. Minimal diverticulosis of the colon. Stable 1.3 cm exophytic hypodensity over the mid pole left kidney likely a cyst. Couple small hypodense thyroid nodules with the larger over the left lobe measuring 1.2 cm. Recommend follow-up as clinically indicated. Electronically Signed   By: Marin Olp M.D.   On: 06/30/2015 17:24    Assessment and plan:   Verdie Zacarias [name] is an 40 y.o. male patient with PRES, secondary to labile hypertension. Clinically stable. Continue tight blood pressure control. Plan for repeat brain MRI in 2 weeks to monitor for resolution of the changes seen on prior MRI. If there is any clinical deterioration or new  neurological event symptoms then recommend a follow-up MRI sooner than that. We'll follow up PRN.  Please call for any further questions.

## 2015-06-30 NOTE — Progress Notes (Signed)
PROGRESS NOTE    Charles [name]  MCN:[id number] DOB: 1976/01/02 DOA: 06/23/2015 PCP: [name], MD   Brief Narrative: Charles [name] is a 40 y.o. male with medical history significant of Multiple Myeloma, CKD 3, back pain, currently undergoing chemotherapy, reports he was started on a new chemotherapeutic agent recently, last chemo was last week, went to the cancer center with Nausea, Vomiting, diarrhea, fever and chills x 4days and was sent to the ER. ED Course: Temp of 99.1, labs notable for creatinine of 4.2 up from 3.3 10 days ago   Patient admitted with Nausea, vomiting, diarrhea. Thought to be to chemo. 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 Neurology has been following. Chemotherapy regimen was stopped. We are trying to controlled his BP. He spike fevers, chest x ray consistent with PNA. He was started on Vancomycin and cefepime. Vancomycin stopped 5-30 due to worsening renal function. Nephrology consulted. Also spike fever 5-29, ID consulted.   Assessment & Plan:   Principal Problem:   AKI (acute kidney injury) ([place]) Active Problems:   Multiple myeloma (HCC)   CKD (chronic kidney disease) stage 3, GFR 30-59 ml/min   Nausea with vomiting   Blurry vision, bilateral   HTN (hypertension), malignant   PRES (posterior reversible encephalopathy syndrome)  HTN Emergency, Pres syndrome;  Patient report vision changes the morning 5-26 MRI consistent with Pres.  Appreciate Neurology evaluation.  Discussed with Neurology BP goal 130 to 140.  BP goal 120--140.  Discussed with neurology, MRI in 1 or 2 weeks.  Hydralazine today to 75 mg, metoprolol to 75.  Titration nicardipine  Gtt off as possible SBP goal 120--140   PNA, Presume UTI.  Fevers; chest x ray consistent with PNA. On cefepime. Received vancomycin for 5 days.  Day 6 antibiotics.  WBC fluctuates. Spike fever 5-30 ID consulted due to persistent fevers.   AKI (acute kidney  injury) ([place]) -on CKD 3, baseline is 3.3 -due to GI losses and poor Po intake -hydrate, monitor urine out put.  - [place] negative for hydronephrosis.  -cr increasing, will consult renal.    Multiple myeloma (HCC) -on chemo, followed by Dr.Magrinat, = -elevated WBC could be due to hemoconcentration, decadron with chemo +/- infection -Appreciate Dr Jana Hakim Helps.  -Continue with MS-contin.  -Discussed with Dr Jana Hakim, ok to hold decadron and revlimid.   UTI;  -Abnormal UA, some symptoms with urination reported -continue with  IV cefepime, FU cultures, monitor -Urine multiples bacteria, repeated urine culture no growth to date.    Nausea with vomiting -could be from chemo, ? Infection -supportive care, hydrate, Abx, monitor -improved. Advance diet as tolerated.  Improved.    Anemia; secondary to MM;.  Received one unit 5-30. Plan to transfuse another unit if hb still at 7.   DVT prophylaxis; Lovenox.  Code Status: full code.  Family Communication: care discussed with wife.  Disposition Plan: remain in the step down unit    Consultants:   Dr Jana Hakim.   Neurology   Procedures;   Renal US; medical renal disease, no hydronephrosis.   Antimicrobials:  Ceftriaxone 5-24--cefepime 5-25  Vancomycin 5-25   Subjective: Less hallucination, vision improved. Urine is dark, denies worsening cough or SOB.  Spike fever yesterday   Objective: Filed Vitals:   06/30/15 0500 06/30/15 0600 06/30/15 0700 06/30/15 0809  BP: 143/68 131/65 142/64   Pulse: 81 89 83   Temp:    98.5 F (36.9 C)  TempSrc:    Oral  Resp:  _0 Height:      Weight:      SpO2: 98% 96% 97%     Intake/Output Summary (Last 24 hours) at 06/30/15 0813 Last data filed at 06/30/15 0600  Gross per 24 hour  Intake 1275.08 ml  Output    801 ml  Net 474.08 ml   Filed Weights   06/23/15 1433 06/26/15 1655 06/30/15 0300  Weight: 67.132 kg (148 lb) 64 kg (141 lb 1.5 oz) 68.1 kg (150 lb 2.1 oz)     Examination:  General exam: Appears calm and comfortable  Respiratory system: Clear to auscultation. Respiratory effort normal. Cardiovascular system: S1 & S2 heard, RRR. No JVD, murmurs, rubs, gallops or clicks. No pedal edema. Gastrointestinal system: Abdomen is nondistended, soft and nontender. No organomegaly or masses felt. Normal bowel sounds heard. Central nervous system: Alert . Motor strength 5/5, vision improving  Extremities: Symmetric 5 x 5 power. Skin: No rashes, lesions or ulcers    Data Reviewed: I have personally reviewed following labs and imaging studies  CBC:  Recent Labs Lab 06/23/15 1450  06/26/15 0701 06/27/15 0328 06/28/15 0405 06/29/15 0311 06/30/15 0001 06/30/15 0108  WBC 21.2*  < > 16.1* 12.0* 19.3* 17.4*  --  16.3*  NEUTROABS 16.5*  --   --  10.5* 15.7* 13.4*  --  12.2*  HGB 9.1*  < > 8.5* 7.4* 7.2* 7.0* 6.8* 6.8*  HCT 27.6*  < > 24.9* 23.0* 22.1* 21.7* 21.0* 21.1*  MCV 93.6  < > 93.3 93.9 94.8 96.4  --  93.8  PLT 852*  < > 648* 632* 557* 434*  --  413*  < > = values in this interval not displayed. Basic Metabolic Panel:  Recent Labs Lab 06/26/15 0701 06/27/15 0328 06/28/15 0405 06/29/15 0311 06/30/15 0108  NA 137 135 134* 137 134*  K 3.7 3.5 3.3* 3.3* 3.5  CL 108 106 103 109 103  CO2 21* 19* 20* 18* 19*  GLUCOSE 111* 194* 107* 123* 146*  BUN 47* 53* 65* 69* 79*  CREATININE 3.79* 3.73* 3.84* 3.53* 4.05*  CALCIUM 10.4* 9.4 8.5* 8.3* 8.4*   GFR: Estimated Creatinine Clearance: 20.6 mL/min (by C-G formula based on Cr of 4.05). Liver Function Tests:  Recent Labs Lab 06/23/15 1450 06/24/15 0338 06/27/15 0328 06/28/15 0405  AST 12* 10* 10* 14*  ALT 23 18 11* 14*  ALKPHOS 152* 118 111 131*  BILITOT 0.4 0.3 0.7 0.9  PROT 7.9 6.7 7.0 6.5  ALBUMIN 1.9* 1.6* 1.6* 1.6*   No results for input(s): LIPASE, AMYLASE in the last 168 hours. No results for input(s): AMMONIA in the last 168 hours. Coagulation Profile: No results for  input(s): INR, PROTIME in the last 168 hours. Cardiac Enzymes: No results for input(s): CKTOTAL, CKMB, CKMBINDEX, TROPONINI in the last 168 hours. BNP (last 3 results) No results for input(s): PROBNP in the last 8760 hours. HbA1C: No results for input(s): HGBA1C in the last 72 hours. CBG: No results for input(s): GLUCAP in the last 168 hours. Lipid Profile: No results for input(s): CHOL, HDL, LDLCALC, TRIG, CHOLHDL, LDLDIRECT in the last 72 hours. Thyroid Function Tests: No results for input(s): TSH, T4TOTAL, FREET4, T3FREE, THYROIDAB in the last 72 hours. Anemia Panel: No results for input(s): VITAMINB12, FOLATE, FERRITIN, TIBC, IRON, RETICCTPCT in the last 72 hours. Sepsis Labs:  Recent Labs Lab 06/23/15 1507  LATICACIDVEN 0.78    Recent Results (from the past 240 hour(s))  Urine culture  Status: Abnormal   Collection Time: 06/23/15  2:30 PM  Result Value Ref Range Status   Specimen Description URINE, CLEAN CATCH  Final   Special Requests NONE  Final   Culture MULTIPLE SPECIES PRESENT, SUGGEST RECOLLECTION (A)  Final   Report Status 06/25/2015 FINAL  Final  Blood Culture (routine x 2)     Status: None   Collection Time: 06/23/15  2:45 PM  Result Value Ref Range Status   Specimen Description BLOOD RIGHT ANTECUBITAL  Final   Special Requests BOTTLES DRAWN AEROBIC AND ANAEROBIC 5 CC EA  Final   Culture   Final    NO GROWTH 5 DAYS Performed at [facility]    Report Status 06/28/2015 FINAL  Final  Blood Culture (routine x 2)     Status: None   Collection Time: 06/23/15  3:29 PM  Result Value Ref Range Status   Specimen Description BLOOD RIGHT HAND  Final   Special Requests BOTTLES DRAWN AEROBIC ONLY 9ML  Final   Culture   Final    NO GROWTH 5 DAYS Performed at [facility]    Report Status 06/28/2015 FINAL  Final  Culture, blood (routine x 2) Call MD if unable to obtain prior to antibiotics being given     Status: None (Preliminary result)    Collection Time: 06/25/15  5:05 PM  Result Value Ref Range Status   Specimen Description BLOOD RIGHT ARM  Final   Special Requests BOTTLES DRAWN AEROBIC AND ANAEROBIC 8 CC EA  Final   Culture   Final    NO GROWTH 4 DAYS Performed at [facility]    Report Status PENDING  Incomplete  Culture, blood (routine x 2) Call MD if unable to obtain prior to antibiotics being given     Status: None (Preliminary result)   Collection Time: 06/25/15  5:08 PM  Result Value Ref Range Status   Specimen Description BLOOD RIGHT HAND  Final   Special Requests BOTTLES DRAWN AEROBIC AND ANAEROBIC 10 CC EA  Final   Culture   Final    NO GROWTH 4 DAYS Performed at [facility]    Report Status PENDING  Incomplete  Urine culture     Status: None   Collection Time: 06/25/15  9:17 PM  Result Value Ref Range Status   Specimen Description URINE, CLEAN CATCH  Final   Special Requests NONE  Final   Culture NO GROWTH Performed at [facility]   Final   Report Status 06/27/2015 FINAL  Final  MRSA PCR Screening     Status: None   Collection Time: 06/26/15  5:00 PM  Result Value Ref Range Status   MRSA by PCR NEGATIVE NEGATIVE Final    Comment:        The GeneXpert MRSA Assay (FDA approved for NASAL specimens only), is one component of a comprehensive MRSA colonization surveillance program. It is not intended to diagnose MRSA infection nor to guide or monitor treatment for MRSA infections.          Radiology Studies: No results found.      Scheduled Meds: . sodium chloride  250 mL Intravenous Once  . acyclovir  400 mg Oral Daily  . ceFEPime (MAXIPIME) IV  1 g Intravenous Q24H  . docusate sodium  100 mg Oral Daily  . enoxaparin (LOVENOX) injection  30 mg Subcutaneous Q24H  . feeding supplement (ENSURE ENLIVE)  237 mL Oral TID BM  . hydrALAZINE  75  mg Oral Q8H  . metoprolol tartrate  75 mg Oral BID  . mirtazapine  15 mg Oral QHS  . morphine  15 mg Oral Q12H  .  polyethylene glycol  17 g Oral Daily   Continuous Infusions: . sodium chloride 10 mL/hr at 06/29/15 2100  . sodium chloride 10 mL/hr at 06/29/15 1857  . niCARDipine Stopped (06/29/15 2042)     LOS: 7 days    Time spent: 25 minutes.     Elmarie Shiley, MD [facility] Pager [phone]  If 7PM-7AM, please contact night-coverage [email or web address] Password [email or web address] 06/30/2015, 8:13 AM

## 2015-06-30 NOTE — Progress Notes (Signed)
CRITICAL VALUE ALERT  Critical value received:  Hgb 6.8  Date of notification:  06/30/15  Time of notification:  0024  Critical value read back:Yes.    Nurse who received alert:  Jerl Santos RN  MD notified (1st page):  Lamar Blinks NP  Time of first page:  0025  Responding MD:  Lamar Blinks  Time MD responded:  See blood transfusion orders

## 2015-07-01 ENCOUNTER — Ambulatory Visit: Payer: Self-pay

## 2015-07-01 ENCOUNTER — Ambulatory Visit: Payer: Self-pay | Admitting: Oncology

## 2015-07-01 ENCOUNTER — Telehealth: Payer: Self-pay | Admitting: *Deleted

## 2015-07-01 ENCOUNTER — Other Ambulatory Visit: Payer: Self-pay

## 2015-07-01 ENCOUNTER — Ambulatory Visit
Admit: 2015-07-01 | Discharge: 2015-07-01 | Disposition: A | Discharge status: Home / Self Care | Payer: Self-pay | Attending: Radiation Oncology | Admitting: Radiation Oncology

## 2015-07-01 DIAGNOSIS — D649 Anemia, unspecified: Secondary | ICD-10-CM

## 2015-07-01 DIAGNOSIS — C9000 Multiple myeloma not having achieved remission: Secondary | ICD-10-CM | POA: Insufficient documentation

## 2015-07-01 DIAGNOSIS — C9002 Multiple myeloma in relapse: Secondary | ICD-10-CM | POA: Insufficient documentation

## 2015-07-01 DIAGNOSIS — M899 Disorder of bone, unspecified: Secondary | ICD-10-CM

## 2015-07-01 DIAGNOSIS — M549 Dorsalgia, unspecified: Secondary | ICD-10-CM | POA: Insufficient documentation

## 2015-07-01 LAB — CBC WITH DIFFERENTIAL/PLATELET
BASOS ABS: 0 10*3/uL (ref 0.0–0.1)
BASOS PCT: 0 %
Eosinophils Absolute: 0.2 10*3/uL (ref 0.0–0.7)
Eosinophils Relative: 1 %
HEMATOCRIT: 23.8 % — AB (ref 39.0–52.0)
Hemoglobin: 7.8 g/dL — ABNORMAL LOW (ref 13.0–17.0)
LYMPHS ABS: 2.7 10*3/uL (ref 0.7–4.0)
Lymphocytes Relative: 15 %
MCH: 30.8 pg (ref 26.0–34.0)
MCHC: 32.8 g/dL (ref 30.0–36.0)
MCV: 94.1 fL (ref 78.0–100.0)
MONOS PCT: 15 %
Monocytes Absolute: 2.7 10*3/uL — ABNORMAL HIGH (ref 0.1–1.0)
NEUTROS ABS: 12.5 10*3/uL — AB (ref 1.7–7.7)
Neutrophils Relative %: 69 %
Platelets: 454 10*3/uL — ABNORMAL HIGH (ref 150–400)
RBC: 2.53 MIL/uL — ABNORMAL LOW (ref 4.22–5.81)
RDW: 15.1 % (ref 11.5–15.5)
WBC: 18.1 10*3/uL — ABNORMAL HIGH (ref 4.0–10.5)

## 2015-07-01 LAB — BASIC METABOLIC PANEL
Anion gap: 14 (ref 5–15)
BUN: 91 mg/dL — AB (ref 6–20)
CALCIUM: 8.2 mg/dL — AB (ref 8.9–10.3)
CHLORIDE: 103 mmol/L (ref 101–111)
CO2: 16 mmol/L — AB (ref 22–32)
CREATININE: 4.74 mg/dL — AB (ref 0.61–1.24)
GFR calc non Af Amer: 14 mL/min — ABNORMAL LOW (ref >60)
GFR, EST AFRICAN AMERICAN: 16 mL/min — AB (ref >60)
GLUCOSE: 100 mg/dL — AB (ref 65–99)
Potassium: 3.6 mmol/L (ref 3.5–5.1)
Sodium: 133 mmol/L — ABNORMAL LOW (ref 135–145)

## 2015-07-01 LAB — TYPE AND SCREEN
ABO/RH(D): O POS
Antibody Screen: NEGATIVE
Unit division: 0

## 2015-07-01 LAB — SEDIMENTATION RATE

## 2015-07-01 LAB — HIV ANTIBODY (ROUTINE TESTING W REFLEX): HIV Screen 4th Generation wRfx: NONREACTIVE

## 2015-07-01 LAB — CREATININE, URINE, RANDOM: CREATININE, URINE: 52.74 mg/dL

## 2015-07-01 LAB — SODIUM, URINE, RANDOM: SODIUM UR: 31 mmol/L

## 2015-07-01 LAB — C-REACTIVE PROTEIN: CRP: 31.9 mg/dL — ABNORMAL HIGH (ref <1.0)

## 2015-07-01 MED ORDER — DEXTROSE 5 % IV SOLN
20.0000 mg/m2 | Freq: Once | INTRAVENOUS | Status: AC
  Administered 2015-07-01: 34 mg via INTRAVENOUS
  Filled 2015-07-01: qty 17

## 2015-07-01 MED ORDER — DOCUSATE SODIUM 100 MG PO CAPS
200.0000 mg | ORAL_CAPSULE | Freq: Every day | ORAL | Status: DC
  Administered 2015-07-01 – 2015-07-02 (×2): 200 mg via ORAL
  Filled 2015-07-01 (×2): qty 2

## 2015-07-01 MED ORDER — EPINEPHRINE HCL 1 MG/ML IJ SOLN
0.5000 mg | Freq: Once | INTRAMUSCULAR | Status: DC | PRN
  Filled 2015-07-01: qty 1

## 2015-07-01 MED ORDER — HEPARIN SOD (PORK) LOCK FLUSH 100 UNIT/ML IV SOLN
500.0000 [IU] | Freq: Once | INTRAVENOUS | Status: DC | PRN
  Filled 2015-07-01: qty 5

## 2015-07-01 MED ORDER — DEXAMETHASONE SODIUM PHOSPHATE 100 MG/10ML IJ SOLN
10.0000 mg | Freq: Once | INTRAMUSCULAR | Status: AC
  Administered 2015-07-01: 10 mg via INTRAVENOUS
  Filled 2015-07-01: qty 1

## 2015-07-01 MED ORDER — PROCHLORPERAZINE MALEATE 10 MG PO TABS
10.0000 mg | ORAL_TABLET | Freq: Once | ORAL | Status: AC
  Administered 2015-07-01: 10 mg via ORAL
  Filled 2015-07-01: qty 1

## 2015-07-01 MED ORDER — HEPARIN SOD (PORK) LOCK FLUSH 100 UNIT/ML IV SOLN
250.0000 [IU] | Freq: Once | INTRAVENOUS | Status: DC | PRN
  Filled 2015-07-01: qty 3

## 2015-07-01 MED ORDER — ALBUTEROL SULFATE (2.5 MG/3ML) 0.083% IN NEBU: 2.5000 mg | INHALATION_SOLUTION | Freq: Once | RESPIRATORY_TRACT | Status: DC | PRN

## 2015-07-01 MED ORDER — FAMOTIDINE IN NACL 20-0.9 MG/50ML-% IV SOLN: 20.0000 mg | Freq: Once | INTRAVENOUS | Status: DC | PRN

## 2015-07-01 MED ORDER — LENALIDOMIDE 10 MG PO CAPS
10.0000 mg | ORAL_CAPSULE | ORAL | Status: DC
  Administered 2015-07-01 – 2015-07-09 (×5): 10 mg via ORAL
  Filled 2015-07-01 (×3): qty 1

## 2015-07-01 MED ORDER — MORPHINE SULFATE ER 30 MG PO TBCR
30.0000 mg | EXTENDED_RELEASE_TABLET | Freq: Two times a day (BID) | ORAL | Status: DC
  Administered 2015-07-01 – 2015-07-10 (×19): 30 mg via ORAL
  Filled 2015-07-01 (×19): qty 1

## 2015-07-01 MED ORDER — EPINEPHRINE HCL 0.1 MG/ML IJ SOSY
0.2500 mg | PREFILLED_SYRINGE | Freq: Once | INTRAMUSCULAR | Status: DC | PRN
  Filled 2015-07-01: qty 10

## 2015-07-01 MED ORDER — SODIUM CHLORIDE 0.9% FLUSH: 10.0000 mL | INTRAVENOUS | Status: DC | PRN

## 2015-07-01 MED ORDER — SODIUM CHLORIDE 0.9 % IV SOLN: Freq: Once | INTRAVENOUS | Status: DC | PRN

## 2015-07-01 MED ORDER — DIPHENHYDRAMINE HCL 50 MG/ML IJ SOLN: 25.0000 mg | Freq: Once | INTRAMUSCULAR | Status: DC | PRN

## 2015-07-01 MED ORDER — DIPHENHYDRAMINE HCL 50 MG/ML IJ SOLN: 50.0000 mg | Freq: Once | INTRAMUSCULAR | Status: DC | PRN

## 2015-07-01 MED ORDER — SODIUM CHLORIDE 0.9% FLUSH
3.0000 mL | INTRAVENOUS | Status: DC | PRN
  Administered 2015-07-02: 3 mL via INTRAVENOUS
  Filled 2015-07-01: qty 3

## 2015-07-01 MED ORDER — SODIUM CHLORIDE 0.9 % IV SOLN
Freq: Once | INTRAVENOUS | Status: AC
  Administered 2015-07-01: 11:00:00 via INTRAVENOUS

## 2015-07-01 MED ORDER — ALTEPLASE 2 MG IJ SOLR
2.0000 mg | Freq: Once | INTRAMUSCULAR | Status: DC | PRN
  Filled 2015-07-01: qty 2

## 2015-07-01 MED ORDER — METHYLPREDNISOLONE SODIUM SUCC 125 MG IJ SOLR: 125.0000 mg | Freq: Once | INTRAMUSCULAR | Status: DC | PRN

## 2015-07-01 NOTE — Progress Notes (Signed)
PHARMACIST - PHYSICIAN COMMUNICATION CONCERNING:  Chemotherapy  Dr. Jana Hakim communicated that he wanted the patient to receive cycle 2 of carfilzomib today, despite what he wrote in his note today, "I will resume carfilzomib/dexamethasone/lenalidomide tomorrow if possible."  Day 1 Cycle 2 Carfilzomib 20 mg/m2 order (dose reduced from 27 mg/m2 for cycle 2 by Dr. Jana Hakim; reasoning: provider judgement)  has been released by pharmacy and is planned to be given late this morning before the patient's radiation treatment this afternoon.  Confirmed with Dr. Jana Hakim that the dexamethasone dose should be 10 mg IV today.  He communicated that he plans for the patient to receive dexamethasone 10 mg on treatment days and 20 mg on non-treatment days.  Confirmed with Dr. Jana Hakim that he would like Revlimid to be resumed today.  Patient has provided supply of 10 mg capsules from home.  Due to CrCl<30 ml/min and available supply, Dr. Jana Hakim provided a verbal order to resume Revlimid today at 10 mg PO q48h.  Hershal Coria, PharmD, BCPS Pager: [phone] 07/01/2015 10:21 AM

## 2015-07-01 NOTE — Progress Notes (Signed)
Pt arrived to the unit from Radiation department. Pt went to bathroom and voided tea colored urine, sample collected per MD orders. Pt appeared sweaty, he stated it was from the chemo and radiation today. Pt also reported feeling light-headed while sitting and standing. His BP sitting was 83/66, HR 77. Had pt lay down and BP was 117/63, HR 79. Pt reported feeling a little less light-headed with lying. Pt denied pain at this time. Agree with this am's assessment. Lungs clear and diminished, skin intact, but diaphoretic. Pt and his sone were educated on fall prevention and safety and told to call to get out of bed. They both agreed. Will continue to monitor pt.  Othella Boyer [facility] ,07/01/2015  5:12 PM

## 2015-07-01 NOTE — Progress Notes (Signed)
Subjective: No new complaints   Antibiotics:  Anti-infectives    Start     Dose/Rate Route Frequency Ordered Stop   06/30/15 1600  vancomycin (VANCOCIN) IVPB 750 mg/150 ml premix  Status:  Discontinued     750 mg 150 mL/hr over 60 Minutes Intravenous Every 24 hours 06/29/15 1742 06/30/15 0642   06/27/15 1000  acyclovir (ZOVIRAX) tablet 400 mg     400 mg Oral Daily 06/27/15 0502     06/25/15 1800  ceFEPIme (MAXIPIME) 1 g in dextrose 5 % 50 mL IVPB  Status:  Discontinued     1 g 100 mL/hr over 30 Minutes Intravenous Every 24 hours 06/25/15 1604 06/30/15 2221   06/25/15 1700  vancomycin (VANCOCIN) IVPB 1000 mg/200 mL premix  Status:  Discontinued     1,000 mg 200 mL/hr over 60 Minutes Intravenous Every 48 hours 06/25/15 1613 06/29/15 1742   06/24/15 1000  acyclovir (ZOVIRAX) 200 MG capsule 400 mg  Status:  Discontinued     400 mg Oral Daily 06/23/15 1858 06/27/15 0502   06/24/15 0800  cefTRIAXone (ROCEPHIN) 1 g in dextrose 5 % 50 mL IVPB  Status:  Discontinued     1 g 100 mL/hr over 30 Minutes Intravenous Every 24 hours 06/23/15 1858 06/25/15 1603   06/23/15 1615  cefTRIAXone (ROCEPHIN) 1 g in dextrose 5 % 50 mL IVPB     1 g 100 mL/hr over 30 Minutes Intravenous  Once 06/23/15 1602 06/23/15 1638      Medications: Scheduled Meds: . sodium chloride  250 mL Intravenous Once  . acyclovir  400 mg Oral Daily  . docusate sodium  200 mg Oral Daily  . enoxaparin (LOVENOX) injection  30 mg Subcutaneous Q24H  . feeding supplement (ENSURE ENLIVE)  237 mL Oral TID BM  . hydrALAZINE  75 mg Oral Q8H  . lenalidomide  10 mg Oral Q48H  . metoprolol tartrate  75 mg Oral BID  . mirtazapine  15 mg Oral QHS  . morphine  30 mg Oral Q12H  . polyethylene glycol  17 g Oral Daily   Continuous Infusions: . sodium chloride 50 mL/hr at 07/01/15 1642  . niCARDipine Stopped (07/01/15 0831)   PRN Meds:.sodium chloride, acetaminophen, albuterol, alteplase, diatrizoate meglumine-sodium,  diphenhydrAMINE, diphenhydrAMINE, EPINEPHrine, EPINEPHrine, EPINEPHrine, EPINEPHrine, famotidine, heparin lock flush, heparin lock flush, heparin lock flush, heparin lock flush, HYDROcodone-acetaminophen, methylPREDNISolone sodium succinate, prochlorperazine, sodium chloride flush, sodium chloride flush, sodium chloride flush, sodium chloride flush    Objective: Weight change: 3 lb 15.5 oz (1.8 kg)  Intake/Output Summary (Last 24 hours) at 07/01/15 1706 Last data filed at 07/01/15 1633  Gross per 24 hour  Intake 2043.38 ml  Output    625 ml  Net 1418.38 ml   Blood pressure 117/63, pulse 79, temperature 99.1 F (37.3 C), temperature source Oral, resp. rate 22, height 5' (1.524 m), weight 155 lb 10.3 oz (70.6 kg), SpO2 96 %. Temp:  [98.5 F (36.9 C)-100.4 F (38 C)] 99.1 F (37.3 C) (05/31 1200) Pulse Rate:  [77-93] 79 (05/31 1638) Resp:  [16-22] 22 (05/31 1635) BP: (83-147)/(41-78) 117/63 mmHg (05/31 1638) SpO2:  [93 %-99 %] 96 % (05/31 1635) Weight:  [154 lb 1.6 oz (69.9 kg)-155 lb 10.3 oz (70.6 kg)] 155 lb 10.3 oz (70.6 kg) (05/31 1638)  Physical Exam: General: Alert and awake, oriented x3, not in any acute distress. HEENT: anicteric sclera, EOMI, oropharynx clear and without exudate Cardiovascular: regular rate, normal  r, no murmur rubs or gallops Pulmonary: clear to auscultation bilaterally, no wheezing, rales or rhonchi Gastrointestinal: soft nontender, nondistended, normal bowel sounds, Musculoskeletal: no Deformities. Skin, soft tissue: no rashes Neuro: nonfocal, strength and sensation intact  CBC:  CBC Latest Ref Rng 07/01/2015 06/30/2015 06/30/2015  WBC 4.0 - 10.5 K/uL 18.1(H) - 16.3(H)  Hemoglobin 13.0 - 17.0 g/dL 7.8(L) 9.5(L) 6.8(LL)  Hematocrit 39.0 - 52.0 % 23.8(L) 28.7(L) 21.1(L)  Platelets 150 - 400 K/uL 454(H) - 413(H)       BMET  Recent Labs  06/30/15 0108 07/01/15 0327  NA 134* 133*  K 3.5 3.6  CL 103 103  CO2 19* 16*  GLUCOSE 146* 100*    BUN 79* 91*  CREATININE 4.05* 4.74*  CALCIUM 8.4* 8.2*     Liver Panel  No results for input(s): PROT, ALBUMIN, AST, ALT, ALKPHOS, BILITOT, BILIDIR, IBILI in the last 72 hours.     Sedimentation Rate  Recent Labs  07/01/15 0327  ESRSEDRATE >140*   C-Reactive Protein  Recent Labs  07/01/15 0327  CRP 31.9*    Micro Results: Recent Results (from the past 720 hour(s))  Urine culture     Status: Abnormal   Collection Time: 06/23/15  2:30 PM  Result Value Ref Range Status   Specimen Description URINE, CLEAN CATCH  Final   Special Requests NONE  Final   Culture MULTIPLE SPECIES PRESENT, SUGGEST RECOLLECTION (A)  Final   Report Status 06/25/2015 FINAL  Final  Blood Culture (routine x 2)     Status: None   Collection Time: 06/23/15  2:45 PM  Result Value Ref Range Status   Specimen Description BLOOD RIGHT ANTECUBITAL  Final   Special Requests BOTTLES DRAWN AEROBIC AND ANAEROBIC 5 CC EA  Final   Culture   Final    NO GROWTH 5 DAYS Performed at [facility]    Report Status 06/28/2015 FINAL  Final  Blood Culture (routine x 2)     Status: None   Collection Time: 06/23/15  3:29 PM  Result Value Ref Range Status   Specimen Description BLOOD RIGHT HAND  Final   Special Requests BOTTLES DRAWN AEROBIC ONLY 9ML  Final   Culture   Final    NO GROWTH 5 DAYS Performed at [facility]    Report Status 06/28/2015 FINAL  Final  Culture, blood (routine x 2) Call MD if unable to obtain prior to antibiotics being given     Status: None   Collection Time: 06/25/15  5:05 PM  Result Value Ref Range Status   Specimen Description BLOOD RIGHT ARM  Final   Special Requests BOTTLES DRAWN AEROBIC AND ANAEROBIC 8 CC EA  Final   Culture   Final    NO GROWTH 5 DAYS Performed at [facility]    Report Status 06/30/2015 FINAL  Final  Culture, blood (routine x 2) Call MD if unable to obtain prior to antibiotics being given     Status: None   Collection Time: 06/25/15   5:08 PM  Result Value Ref Range Status   Specimen Description BLOOD RIGHT HAND  Final   Special Requests BOTTLES DRAWN AEROBIC AND ANAEROBIC 10 CC EA  Final   Culture   Final    NO GROWTH 5 DAYS Performed at [facility]    Report Status 06/30/2015 FINAL  Final  Urine culture     Status: None   Collection Time: 06/25/15  9:17 PM  Result Value Ref Range  Status   Specimen Description URINE, CLEAN CATCH  Final   Special Requests NONE  Final   Culture NO GROWTH Performed at [facility]   Final   Report Status 06/27/2015 FINAL  Final  MRSA PCR Screening     Status: None   Collection Time: 06/26/15  5:00 PM  Result Value Ref Range Status   MRSA by PCR NEGATIVE NEGATIVE Final    Comment:        The GeneXpert MRSA Assay (FDA approved for NASAL specimens only), is one component of a comprehensive MRSA colonization surveillance program. It is not intended to diagnose MRSA infection nor to guide or monitor treatment for MRSA infections.     Studies/Results: Ct Abdomen Pelvis Wo Contrast  06/30/2015  CLINICAL DATA:  History of multiple myeloma with chronic kidney disease stage 3 and back pain currently undergoing chemotherapy. Nausea, vomiting, diarrhea, fever and chills four days. EXAM: CT CHEST, ABDOMEN AND PELVIS WITHOUT CONTRAST TECHNIQUE: Multidetector CT imaging of the chest, abdomen and pelvis was performed following the standard protocol without IV contrast. COMPARISON:  CT 06/23/2015, 02/03/2014 and 01/30/2014 FINDINGS: CT CHEST Lungs are adequately inflated demonstrate a small amount of bilateral pleural fluid left greater than right with associated atelectasis in the posterior lung bases left greater than right. There are new multiple bilateral well-defined focal pleural masses right greater than left with the largest over the right apex measuring 2.8 x 5.2 cm. These likely represent spread of patient's known multiple myeloma. Airways are normal. Heart is normal  size. No definite mediastinal or hilar adenopathy. Small amount contrast within the mid to distal esophagus likely due to dysmotility versus reflux. Remaining mediastinal structures are unremarkable. There are couple hypodense thyroid nodules with the largest measuring 1.2 cm over the left lobe. Patient's known destructive sternal manubrial lesion is unchanged. There is subtle increased lucency over the body of the sternum which is new likely spread of patient's known myeloma. There are several lucencies over the thoracic spine with the most prominent over the T9 vertebral body. CT ABDOMEN AND PELVIS The liver, spleen, gallbladder, pancreas and adrenal glands are within normal. Kidneys normal size without hydronephrosis or nephrolithiasis. Stable exophytic hypodensity over the mid pole cortex of the left kidney likely a cyst. Ureters are normal. Stomach is within normal. Small bowel is normal. Appendix is normal. Subtle diverticulosis of the colon most prominent over the right colon. There is no significant free fluid or focal inflammatory change. Few small periaortic lymph nodes. Vascular structures are unremarkable. Pelvic images demonstrate the bladder, prostate and rectum to be within normal. Ill-defined low-attenuation over the right iliac bone likely a myelomatous lesion. Ill-defined lytic process over the right femoral neck spanning nearly the entire width of the femoral neck increasing risk of pathologic fracture. Bone island over the left sacrum. IMPRESSION: No acute findings in the chest, abdomen or pelvis. Lytic lesions within the sternum, spine, right iliac bone and right femoral neck compatible with patient's known myeloma. Note that patient is at risk for pathologic fracture through the right femoral neck. Multiple new bilateral focal pleural masses with the largest over the right apex measuring 2.8 x 5.2 cm likely metastatic disease from patient's multiple myeloma. Small amount of bilateral pleural  fluid bibasilar consolidation most typical of atelectasis. Minimal diverticulosis of the colon. Stable 1.3 cm exophytic hypodensity over the mid pole left kidney likely a cyst. Couple small hypodense thyroid nodules with the larger over the left lobe measuring 1.2 cm. Recommend follow-up  as clinically indicated. Electronically Signed   By: Marin Olp M.D.   On: 06/30/2015 17:24   Ct Chest Wo Contrast  06/30/2015  CLINICAL DATA:  History of multiple myeloma with chronic kidney disease stage 3 and back pain currently undergoing chemotherapy. Nausea, vomiting, diarrhea, fever and chills four days. EXAM: CT CHEST, ABDOMEN AND PELVIS WITHOUT CONTRAST TECHNIQUE: Multidetector CT imaging of the chest, abdomen and pelvis was performed following the standard protocol without IV contrast. COMPARISON:  CT 06/23/2015, 02/03/2014 and 01/30/2014 FINDINGS: CT CHEST Lungs are adequately inflated demonstrate a small amount of bilateral pleural fluid left greater than right with associated atelectasis in the posterior lung bases left greater than right. There are new multiple bilateral well-defined focal pleural masses right greater than left with the largest over the right apex measuring 2.8 x 5.2 cm. These likely represent spread of patient's known multiple myeloma. Airways are normal. Heart is normal size. No definite mediastinal or hilar adenopathy. Small amount contrast within the mid to distal esophagus likely due to dysmotility versus reflux. Remaining mediastinal structures are unremarkable. There are couple hypodense thyroid nodules with the largest measuring 1.2 cm over the left lobe. Patient's known destructive sternal manubrial lesion is unchanged. There is subtle increased lucency over the body of the sternum which is new likely spread of patient's known myeloma. There are several lucencies over the thoracic spine with the most prominent over the T9 vertebral body. CT ABDOMEN AND PELVIS The liver, spleen,  gallbladder, pancreas and adrenal glands are within normal. Kidneys normal size without hydronephrosis or nephrolithiasis. Stable exophytic hypodensity over the mid pole cortex of the left kidney likely a cyst. Ureters are normal. Stomach is within normal. Small bowel is normal. Appendix is normal. Subtle diverticulosis of the colon most prominent over the right colon. There is no significant free fluid or focal inflammatory change. Few small periaortic lymph nodes. Vascular structures are unremarkable. Pelvic images demonstrate the bladder, prostate and rectum to be within normal. Ill-defined low-attenuation over the right iliac bone likely a myelomatous lesion. Ill-defined lytic process over the right femoral neck spanning nearly the entire width of the femoral neck increasing risk of pathologic fracture. Bone island over the left sacrum. IMPRESSION: No acute findings in the chest, abdomen or pelvis. Lytic lesions within the sternum, spine, right iliac bone and right femoral neck compatible with patient's known myeloma. Note that patient is at risk for pathologic fracture through the right femoral neck. Multiple new bilateral focal pleural masses with the largest over the right apex measuring 2.8 x 5.2 cm likely metastatic disease from patient's multiple myeloma. Small amount of bilateral pleural fluid bibasilar consolidation most typical of atelectasis. Minimal diverticulosis of the colon. Stable 1.3 cm exophytic hypodensity over the mid pole left kidney likely a cyst. Couple small hypodense thyroid nodules with the larger over the left lobe measuring 1.2 cm. Recommend follow-up as clinically indicated. Electronically Signed   By: Marin Olp M.D.   On: 06/30/2015 17:24      Assessment/Plan:  INTERVAL HISTORY:   CT scans done have failed to show an infectious cause for his fevers and I stopped his antibiotics.   Principal Problem:   AKI (acute kidney injury) ([place]) Active Problems:   Multiple  myeloma ([facility])   CKD (chronic kidney disease) stage 3, GFR 30-59 ml/min   Nausea with vomiting   Blurry vision, bilateral   HTN (hypertension), malignant   PRES (posterior reversible encephalopathy syndrome)   FUO (fever of unknown origin)  Charles [name] is a 40 y.o. male with  Multiple myeloma sp chemotherapy initation admitted with nausea, vomiting, with possible PRESS leukocytosis then  fevers on antbiotics for 8 days though without a clear target.   #1 FUO:  CT abdomen and pelvis shows plate evidence of his multiple myeloma but no evidence of clear-cut infection and I therefore stop his antibiotics.  I'll obtain MRI of his thoracic and lumbar spines which I expect will show further evidence of multiple myeloma involvement.  If there is no evidence of discitis or osteomyelitis to suggest infection then I would not pursue further workup at this time and attribute both his leukocytosis and his fevers to his multiple myeloma.  #2 multiple myeloma he has resumed chemotherapy.  #3 acute on chronic renal failure being seen by Dr. Burnett Sheng with nephrology  If his MRI T and L spine are unremarkable for INFECTION we will sign off.   Dr. Megan Salon will otherwise be covering for questions in June.    LOS: 8 days   Alcide Evener 07/01/2015, 5:06 PM

## 2015-07-01 NOTE — Progress Notes (Signed)
Charles [name]   DOB:[birth year]   OH#:[id number]   [id number]  Subjective: Charles [name] feels "okay." No longer having "visions." --was seeing himself at work, objects moving on their own-- however he was aware these were not real. Vision currently not blurry. Hurting in his back and sternum but has not used IV morphine in 48 hrs or so. Having daily BMs which are becoming a bit harder. Ambulates in room. Wants to go home. Wife in room-- she feels he can go home "once he is stable on his new meds."  Objective: middle aged latin man examined at bedside Filed Vitals:   07/01/15 0500 07/01/15 0600  BP: 110/50 122/51  Pulse:    Temp:    Resp: 16 17    Body mass index is 28.18 kg/(m^2).  Intake/Output Summary (Last 24 hours) at 07/01/15 0803 Last data filed at 07/01/15 0600  Gross per 24 hour  Intake 1774.71 ml  Output    425 ml  Net 1349.71 ml     Oropharynx no thrush  No peripheral adenopathy  Lungs no rales or rhonchi  Heart regular rate and rhythm  Abdomen NT, +BS  Neuro nonfocal, A&O x3, appropriate affect    CBG (last 3)  No results for input(s): GLUCAP in the last 72 hours.   Labs:  Lab Results  Component Value Date   WBC 18.1* 07/01/2015   HGB 7.8* 07/01/2015   HCT 23.8* 07/01/2015   MCV 94.1 07/01/2015   PLT 454* 07/01/2015   NEUTROABS 12.5* 07/01/2015    @LASTCHEMISTRY @  Urine Studies No results for input(s): UHGB, CRYS in the last 72 hours.  Invalid input(s): UACOL, UAPR, USPG, UPH, UTP, UGL, UKET, UBIL, UNIT, UROB, [place], UEPI, UWBC, [place], [place], [place], [place], [place]  Basic Metabolic Panel:  Recent Labs Lab 06/27/15 0328 06/28/15 0405 06/29/15 0311 06/30/15 0108 07/01/15 0327  NA 135 134* 137 134* 133*  K 3.5 3.3* 3.3* 3.5 3.6  CL 106 103 109 103 103  CO2 19* 20* 18* 19* 16*  GLUCOSE 194* 107* 123* 146* 100*  BUN 53* 65* 69* 79* 91*  CREATININE 3.73* 3.84* 3.53* 4.05* 4.74*  CALCIUM 9.4 8.5* 8.3* 8.4* 8.2*   Ref Range 4d ago  77moago  650moago      Kappa free light chain 3.30 - 19.40 mg/L 145.75 (H) 12.40 (H)R 10.50 (H)R       M-Spike, % Not Observed g/dL 1.8 (H)         GFR Estimated Creatinine Clearance: 17.8 mL/min (by C-G formula based on Cr of 4.74). Liver Function Tests:  Recent Labs Lab 06/27/15 0328 06/28/15 0405  AST 10* 14*  ALT 11* 14*  ALKPHOS 111 131*  BILITOT 0.7 0.9  PROT 7.0 6.5  ALBUMIN 1.6* 1.6*   No results for input(s): LIPASE, AMYLASE in the last 168 hours. No results for input(s): AMMONIA in the last 168 hours. Coagulation profile No results for input(s): INR, PROTIME in the last 168 hours.  CBC:  Recent Labs Lab 06/27/15 0328 06/28/15 0405 06/29/15 0311 06/30/15 0001 06/30/15 0108 06/30/15 1023 07/01/15 0327  WBC 12.0* 19.3* 17.4*  --  16.3*  --  18.1*  NEUTROABS 10.5* 15.7* 13.4*  --  12.2*  --  12.5*  HGB 7.4* 7.2* 7.0* 6.8* 6.8* 9.5* 7.8*  HCT 23.0* 22.1* 21.7* 21.0* 21.1* 28.7* 23.8*  MCV 93.9 94.8 96.4  --  93.8  --  94.1  PLT 632* 557* 434*  --  413*  --  454*   Cardiac Enzymes: No results for input(s): CKTOTAL, CKMB, CKMBINDEX, TROPONINI in the last 168 hours. BNP: Invalid input(s): POCBNP CBG: No results for input(s): GLUCAP in the last 168 hours. D-Dimer No results for input(s): DDIMER in the last 72 hours. Hgb A1c No results for input(s): HGBA1C in the last 72 hours. Lipid Profile No results for input(s): CHOL, HDL, LDLCALC, TRIG, CHOLHDL, LDLDIRECT in the last 72 hours. Thyroid function studies No results for input(s): TSH, T4TOTAL, T3FREE, THYROIDAB in the last 72 hours.  Invalid input(s): FREET3 Anemia work up No results for input(s): VITAMINB12, FOLATE, FERRITIN, TIBC, IRON, RETICCTPCT in the last 72 hours. Microbiology Recent Results (from the past 240 hour(s))  Urine culture     Status: Abnormal   Collection Time: 06/23/15  2:30 PM  Result Value Ref Range Status   Specimen Description URINE, CLEAN CATCH  Final   Special Requests NONE   Final   Culture MULTIPLE SPECIES PRESENT, SUGGEST RECOLLECTION (A)  Final   Report Status 06/25/2015 FINAL  Final  Blood Culture (routine x 2)     Status: None   Collection Time: 06/23/15  2:45 PM  Result Value Ref Range Status   Specimen Description BLOOD RIGHT ANTECUBITAL  Final   Special Requests BOTTLES DRAWN AEROBIC AND ANAEROBIC 5 CC EA  Final   Culture   Final    NO GROWTH 5 DAYS Performed at [facility]    Report Status 06/28/2015 FINAL  Final  Blood Culture (routine x 2)     Status: None   Collection Time: 06/23/15  3:29 PM  Result Value Ref Range Status   Specimen Description BLOOD RIGHT HAND  Final   Special Requests BOTTLES DRAWN AEROBIC ONLY 9ML  Final   Culture   Final    NO GROWTH 5 DAYS Performed at [facility]    Report Status 06/28/2015 FINAL  Final  Culture, blood (routine x 2) Call MD if unable to obtain prior to antibiotics being given     Status: None   Collection Time: 06/25/15  5:05 PM  Result Value Ref Range Status   Specimen Description BLOOD RIGHT ARM  Final   Special Requests BOTTLES DRAWN AEROBIC AND ANAEROBIC 8 CC EA  Final   Culture   Final    NO GROWTH 5 DAYS Performed at [facility]    Report Status 06/30/2015 FINAL  Final  Culture, blood (routine x 2) Call MD if unable to obtain prior to antibiotics being given     Status: None   Collection Time: 06/25/15  5:08 PM  Result Value Ref Range Status   Specimen Description BLOOD RIGHT HAND  Final   Special Requests BOTTLES DRAWN AEROBIC AND ANAEROBIC 10 CC EA  Final   Culture   Final    NO GROWTH 5 DAYS Performed at [facility]    Report Status 06/30/2015 FINAL  Final  Urine culture     Status: None   Collection Time: 06/25/15  9:17 PM  Result Value Ref Range Status   Specimen Description URINE, CLEAN CATCH  Final   Special Requests NONE  Final   Culture NO GROWTH Performed at [facility]   Final   Report Status 06/27/2015 FINAL  Final  MRSA PCR  Screening     Status: None   Collection Time: 06/26/15  5:00 PM  Result Value Ref Range Status   MRSA by PCR NEGATIVE NEGATIVE Final    Comment:  The GeneXpert MRSA Assay (FDA approved for NASAL specimens only), is one component of a comprehensive MRSA colonization surveillance program. It is not intended to diagnose MRSA infection nor to guide or monitor treatment for MRSA infections.       Studies:  Ct Abdomen Pelvis Wo Contrast  06/30/2015  CLINICAL DATA:  History of multiple myeloma with chronic kidney disease stage 3 and back pain currently undergoing chemotherapy. Nausea, vomiting, diarrhea, fever and chills four days. EXAM: CT CHEST, ABDOMEN AND PELVIS WITHOUT CONTRAST TECHNIQUE: Multidetector CT imaging of the chest, abdomen and pelvis was performed following the standard protocol without IV contrast. COMPARISON:  CT 06/23/2015, 02/03/2014 and 01/30/2014 FINDINGS: CT CHEST Lungs are adequately inflated demonstrate a small amount of bilateral pleural fluid left greater than right with associated atelectasis in the posterior lung bases left greater than right. There are new multiple bilateral well-defined focal pleural masses right greater than left with the largest over the right apex measuring 2.8 x 5.2 cm. These likely represent spread of patient's known multiple myeloma. Airways are normal. Heart is normal size. No definite mediastinal or hilar adenopathy. Small amount contrast within the mid to distal esophagus likely due to dysmotility versus reflux. Remaining mediastinal structures are unremarkable. There are couple hypodense thyroid nodules with the largest measuring 1.2 cm over the left lobe. Patient's known destructive sternal manubrial lesion is unchanged. There is subtle increased lucency over the body of the sternum which is new likely spread of patient's known myeloma. There are several lucencies over the thoracic spine with the most prominent over the T9 vertebral  body. CT ABDOMEN AND PELVIS The liver, spleen, gallbladder, pancreas and adrenal glands are within normal. Kidneys normal size without hydronephrosis or nephrolithiasis. Stable exophytic hypodensity over the mid pole cortex of the left kidney likely a cyst. Ureters are normal. Stomach is within normal. Small bowel is normal. Appendix is normal. Subtle diverticulosis of the colon most prominent over the right colon. There is no significant free fluid or focal inflammatory change. Few small periaortic lymph nodes. Vascular structures are unremarkable. Pelvic images demonstrate the bladder, prostate and rectum to be within normal. Ill-defined low-attenuation over the right iliac bone likely a myelomatous lesion. Ill-defined lytic process over the right femoral neck spanning nearly the entire width of the femoral neck increasing risk of pathologic fracture. Bone island over the left sacrum. IMPRESSION: No acute findings in the chest, abdomen or pelvis. Lytic lesions within the sternum, spine, right iliac bone and right femoral neck compatible with patient's known myeloma. Note that patient is at risk for pathologic fracture through the right femoral neck. Multiple new bilateral focal pleural masses with the largest over the right apex measuring 2.8 x 5.2 cm likely metastatic disease from patient's multiple myeloma. Small amount of bilateral pleural fluid bibasilar consolidation most typical of atelectasis. Minimal diverticulosis of the colon. Stable 1.3 cm exophytic hypodensity over the mid pole left kidney likely a cyst. Couple small hypodense thyroid nodules with the larger over the left lobe measuring 1.2 cm. Recommend follow-up as clinically indicated. Electronically Signed   By: Marin Olp M.D.   On: 06/30/2015 17:24   Ct Chest Wo Contrast  06/30/2015  CLINICAL DATA:  History of multiple myeloma with chronic kidney disease stage 3 and back pain currently undergoing chemotherapy. Nausea, vomiting, diarrhea,  fever and chills four days. EXAM: CT CHEST, ABDOMEN AND PELVIS WITHOUT CONTRAST TECHNIQUE: Multidetector CT imaging of the chest, abdomen and pelvis was performed following the standard protocol without  IV contrast. COMPARISON:  CT 06/23/2015, 02/03/2014 and 01/30/2014 FINDINGS: CT CHEST Lungs are adequately inflated demonstrate a small amount of bilateral pleural fluid left greater than right with associated atelectasis in the posterior lung bases left greater than right. There are new multiple bilateral well-defined focal pleural masses right greater than left with the largest over the right apex measuring 2.8 x 5.2 cm. These likely represent spread of patient's known multiple myeloma. Airways are normal. Heart is normal size. No definite mediastinal or hilar adenopathy. Small amount contrast within the mid to distal esophagus likely due to dysmotility versus reflux. Remaining mediastinal structures are unremarkable. There are couple hypodense thyroid nodules with the largest measuring 1.2 cm over the left lobe. Patient's known destructive sternal manubrial lesion is unchanged. There is subtle increased lucency over the body of the sternum which is new likely spread of patient's known myeloma. There are several lucencies over the thoracic spine with the most prominent over the T9 vertebral body. CT ABDOMEN AND PELVIS The liver, spleen, gallbladder, pancreas and adrenal glands are within normal. Kidneys normal size without hydronephrosis or nephrolithiasis. Stable exophytic hypodensity over the mid pole cortex of the left kidney likely a cyst. Ureters are normal. Stomach is within normal. Small bowel is normal. Appendix is normal. Subtle diverticulosis of the colon most prominent over the right colon. There is no significant free fluid or focal inflammatory change. Few small periaortic lymph nodes. Vascular structures are unremarkable. Pelvic images demonstrate the bladder, prostate and rectum to be within normal.  Ill-defined low-attenuation over the right iliac bone likely a myelomatous lesion. Ill-defined lytic process over the right femoral neck spanning nearly the entire width of the femoral neck increasing risk of pathologic fracture. Bone island over the left sacrum. IMPRESSION: No acute findings in the chest, abdomen or pelvis. Lytic lesions within the sternum, spine, right iliac bone and right femoral neck compatible with patient's known myeloma. Note that patient is at risk for pathologic fracture through the right femoral neck. Multiple new bilateral focal pleural masses with the largest over the right apex measuring 2.8 x 5.2 cm likely metastatic disease from patient's multiple myeloma. Small amount of bilateral pleural fluid bibasilar consolidation most typical of atelectasis. Minimal diverticulosis of the colon. Stable 1.3 cm exophytic hypodensity over the mid pole left kidney likely a cyst. Couple small hypodense thyroid nodules with the larger over the left lobe measuring 1.2 cm. Recommend follow-up as clinically indicated. Electronically Signed   By: Marin Olp M.D.   On: 06/30/2015 17:24    Assessment: 40 y.o. 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;   I: CyBorD started January 2016 (a) dexamethasone 20 mg/d every TU/WED started 02/04/2014 (b) bortezomib sQ days 1,4,8,11 of every 21 day cycle started 02/10/2014 (c) cyclophosphamide 300 mg/M2 IV weekly started 02/13/2014   2: Lenalidomide 1m daily started  07/26/2014, discontinued December 2016 with progression  (2) chronic kidney disease stage III--worsening creatinine; renal following  Results for JZAMIER, EGGEBRECHT(MRN [id number] as of 07/01/2015 08:16  Ref. Range 06/27/2015 03:28 06/28/2015 04:05 06/29/2015 03:11 06/30/2015 01:08 07/01/2015 03:27  Creatinine Latest Ref Range:  0.61-1.24 mg/dL 3.73 (H) 3.84 (H) 3.53 (H) 4.05 (H) 4.74 (H)    (3) hypercalcemia: zolendronate started 02/10/2014-- repeat every 4 weeks initially, then every 12 weeks (a) changed to denosumab/Xgeva starting 02/26/2015 because of CKD, repeated every 12 weeks  (4) ID prophylaxis: (a) influenza and Pneumovax [PV13] vaccines 02/07/2014 (b) acyclovir started 02/07/2014 (c) HIV negative 01/31/2014 (d) Pneumovax P23 too be given after March 2016  (5). The patient is not a transplant candidate for financial reasons  (6) bilateral foot rash: Started on Septra DS and Diflucan 09/03/2014, resolved  (7) Multiple Myeloma relapse in December 2016.  (a) starting 02/05/15: dexamethasone 61m x2 consecutive days weekly; bortezomib sQ weekly,  (b) Septra DS twice daily (c) acyclovir 4034mdaily  (d) bortezomib/ dexamethasone discontinued after 05/06/2015 dose with evidence of progression  (8) started carfilzomib 05/28/2015, given with weekly low dose dexamethasone (a) tolerated cyclophosphamide poorly, discontinued after cycle 1 (b) carfilzomib started 05/28/2015, first cycle only 4 doses (c) taking dexamethasone 20 mg daily 2 days a week  (9) lenalidomide 10 mg po daily to be added, taken 21 days on, 7 days off  (10) uncontrolled pain: currently on MSContin BID with hydrocodone/APAP for breakthrough pain  (a) bowel prophylaxis with miraLAX, docusate  (b) palliative radiation pending  (11) fever: all cultures negative to date,  currently AF; consider "tumor fever"    Plan:  Charles [name]'s situation is complex and his prognosis is not good, but as best as I can reconstruct his recent course he was admitted with hypercalcemia, treated with pamidronate and IVF, the latter increased his HTN and may have precipitated PRES. His BP is now better controlled and he is asymptomatic neurologically. He is still on a drip however and will have to be changed to oral agents before he can go home.  I have increased his pain regimen to 30 mg BID of the MSContin and increased his bowel prophylaxis to miralax daily and docusate 200 daily. I am very concerned about his renal function--is he a hemodialysis candidate if it comes to that?  I will resume carfilzomib/dexamethasone/lenalidomide tomorrow if possible. I am hopeful this may decrease the light chains which doubtless are the main cause of his renal failure.  Greatly appreciate your help to this unfortunate patient!  I am hopeful   MAChauncey CruelMD 07/01/2015  8:03 AM Medical On[facility] H[facility]vMcCullom LakeNC [address]el. [phone]  Fax. [phone]

## 2015-07-01 NOTE — Progress Notes (Addendum)
[facility] Progress Note   Subjective: much more alert today, getting up on his own. Creat up 4.5  Filed Vitals:   07/01/15 1135 07/01/15 1150 07/01/15 1200 07/01/15 1300  BP: 126/63 122/63 124/59 131/69  Pulse:      Temp: 98.5 F (36.9 C) 99.3 F (37.4 C) 99.1 F (37.3 C)   TempSrc: Oral Oral Oral   Resp: '19 17 16 16  '$ Height:      Weight:      SpO2: 97% 98% 95% 95%    Inpatient medications: . sodium chloride  250 mL Intravenous Once  . acyclovir  400 mg Oral Daily  . docusate sodium  200 mg Oral Daily  . enoxaparin (LOVENOX) injection  30 mg Subcutaneous Q24H  . feeding supplement (ENSURE ENLIVE)  237 mL Oral TID BM  . hydrALAZINE  75 mg Oral Q8H  . lenalidomide  10 mg Oral Q48H  . metoprolol tartrate  75 mg Oral BID  . mirtazapine  15 mg Oral QHS  . morphine  30 mg Oral Q12H  . polyethylene glycol  17 g Oral Daily   . sodium chloride Stopped (06/30/15 0800)  . sodium chloride Stopped (06/30/15 0800)  . sodium chloride 50 mL/hr at 07/01/15 1300  . niCARDipine Stopped (07/01/15 0831)   sodium chloride, acetaminophen, albuterol, alteplase, diatrizoate meglumine-sodium, diphenhydrAMINE, diphenhydrAMINE, EPINEPHrine, EPINEPHrine, EPINEPHrine, EPINEPHrine, famotidine, heparin lock flush, heparin lock flush, heparin lock flush, heparin lock flush, HYDROcodone-acetaminophen, methylPREDNISolone sodium succinate, prochlorperazine, sodium chloride flush, sodium chloride flush, sodium chloride flush, sodium chloride flush  Exam: Alert, Ox 3 Throat clear  No jve Chest clear bilat RRR soft SEM, no RG Abd soft ntnd no mass or ascites +bs GU normal male Ext no LE edema Neuro is alert, Ox 3 , nf, no asterixis  UA by undersigned > urine dark brownish-red, micro w sheets of RBC's (nondysmorphic), 20-30 WBC/ hpf, moderate gran casts, some degen cellular casts, minimal bact Renal US 5/24 - 12 cm kidneys, no hydro, ^'d echo mild CXR - no acute disease (5/25), new  mass like shadows  Assessment: 1  Renal failure - progressive renal failure over last 5 mos in pt w known myeloma since 12/15.  No better today, creat up 4.5. Myeloma kidney vs ATN/ AIN vs other.  Not grossly uremic, no indication for RRT.  Will follow.  2  Volume - looks euvolemic, cont IVF 50/hr 3  Mult myeloma - to get chemo today w carfilzomib. 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 assoc with proteasome inhibitors 5  HTN - off the cardene gtt now, BP's good (hydral/ MTP)  Plan - cont gentle IVF"s, BP meds, check urine Na/Cr   [name] MD [facility] pager [phone]    cell [phone] 07/01/2015, 1:18 PM    Recent Labs Lab 06/29/15 0311 06/30/15 0108 07/01/15 0327  NA 137 134* 133*  K 3.3* 3.5 3.6  CL 109 103 103  CO2 18* 19* 16*  GLUCOSE 123* 146* 100*  BUN 69* 79* 91*  CREATININE 3.53* 4.05* 4.74*  CALCIUM 8.3* 8.4* 8.2*    Recent Labs Lab 06/27/15 0328 06/28/15 0405  AST 10* 14*  ALT 11* 14*  ALKPHOS 111 131*  BILITOT 0.7 0.9  PROT 7.0 6.5  ALBUMIN 1.6* 1.6*    Recent Labs Lab 06/29/15 0311  06/30/15 0108 06/30/15 1023 07/01/15 0327  WBC 17.4*  --  16.3*  --  18.1*  NEUTROABS 13.4*  --  12.2*  --  12.5*  HGB 7.0*  < > 6.8* 9.5* 7.8*  HCT 21.7*  < > 21.1* 28.7* 23.8*  MCV 96.4  --  93.8  --  94.1  PLT 434*  --  413*  --  454*  < > = values in this interval not displayed.

## 2015-07-01 NOTE — Progress Notes (Signed)
Chemotherapy infusion completed.  Vital signs remained stable throughout.  Patient denied any shortness of breath or other symptoms.  IV site patency verified before, during, and after infusion with good blood return noted.  Family at bedside.  Chemotherapy precautions reviewed with staff.

## 2015-07-01 NOTE — Progress Notes (Signed)
PROGRESS NOTE    Charles [name]  IEP:[id number] DOB: 1975-03-11 DOA: 06/23/2015 PCP: [name], MD   Brief Narrative: Charles [name] is a 40 y.o. male with medical history significant of Multiple Myeloma, CKD 3, back pain, currently undergoing chemotherapy, reports he was started on a new chemotherapeutic agent recently, last chemo was last week, went to the cancer center with Nausea, Vomiting, diarrhea, fever and chills x 4days and was sent to the ER. ED Course: Temp of 99.1, labs notable for creatinine of 4.2 up from 3.3 10 days ago   Patient admitted with Nausea, vomiting, diarrhea. Thought to be to chemo. 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 Neurology has been following. Chemotherapy regimen was stopped. We are trying to controlled his BP. He spike fevers, chest x ray consistent with PNA. He was started on Vancomycin and cefepime. Vancomycin stopped 5-30 due to worsening renal function. Nephrology consulted. Also spike fever 5-29, ID consulted.   Assessment & Plan:   Principal Problem:   AKI (acute kidney injury) ([place]) Active Problems:   Multiple myeloma ([facility])   CKD (chronic kidney disease) stage 3, GFR 30-59 ml/min   Nausea with vomiting   Blurry vision, bilateral   HTN (hypertension), malignant   PRES (posterior reversible encephalopathy syndrome)   FUO (fever of unknown origin)   Back pain   Multiple myeloma not having achieved remission ([place])  HTN Emergency, Pres syndrome;  Patient report vision changes the morning 5-26 MRI consistent with Pres.  Appreciate Neurology evaluation.  Discussed with neurology, MRI in 1 or 2 weeks.  Hydralazine today to 75 mg, metoprolol to 75.  off nicardipine  Gtt on 5/31, transition out of stepdown.   SBP goal 120--140   Fevers;  chest x ray ? PNA. Uti? Urine multiples bacteria, repeated urine culture no growth to date.   Received cefepime and  vancomycin for 5 days.  WBC fluctuates. Still  Spike fever 5-30 ID consulted due to persistent fevers. Pan CT unrevealing, MRI to r/o spine infection per ID,  Fever possible from multiple myeloma  AKI (acute kidney injury) ([place]) -on CKD 3, baseline is 3.3 -due to GI losses and poor Po intake -hydrate, monitor urine out put.  - [place] negative for hydronephrosis.  -cr increasing,  Renal consulted   Multiple myeloma ([place]) -on chemo, followed by Dr.Magrinat,  -elevated WBC could be due to hemoconcentration, decadron with chemo +/- infection -Appreciate Dr Jana Hakim Helps.  -Continue with MS-contin.  -restarted treatment with carfizomib, lenalidomide, dexamethasone, on 5/31, continue acyclovir prophylaxis. Oncology Dr Jana Hakim input appreciated    Nausea with vomiting -could be from chemo, ? Infection -supportive care, hydrate, Abx, monitor -improved. Advance diet as tolerated.  Improved.    Anemia; secondary to MM;.  Received one unit 5-30. Keep hgb> 7.   DVT prophylaxis; Lovenox.  Code Status: full code.  Family Communication: care discussed with patient and son in room.  Disposition Plan: out of step down unit  To med tele on 5/31   Consultants:   Oncology Dr Jana Hakim.   Neurology   ID  nephrology  Procedures;   Renal US; medical renal disease, no hydronephrosis.   Antimicrobials:  Ceftriaxone 5-24--cefepime 5-25  Vancomycin 5-25   Subjective: no hallucination, vision improved. bp better, report feeling better tmax 100.4  Objective: Filed Vitals:   07/01/15 1300 07/01/15 1635 07/01/15 1638 07/01/15 2131  BP: 131/69 83/66 117/63 115/67  Pulse:  77 79 72  Temp:    97.7 F (  36.5 C)  TempSrc:    Oral  Resp: 16 22  20   Height:   5' (1.524 m)   Weight:   70.6 kg (155 lb 10.3 oz)   SpO2: 95% 96%  95%    Intake/Output Summary (Last 24 hours) at 07/01/15 2224 Last data filed at 07/01/15 2222  Gross per 24 hour  Intake 1691.38 ml  Output    500 ml  Net 1191.38 ml   Filed Weights   06/30/15 0300  07/01/15 0341 07/01/15 1638  Weight: 68.1 kg (150 lb 2.1 oz) 69.9 kg (154 lb 1.6 oz) 70.6 kg (155 lb 10.3 oz)    Examination:  General exam: Appears calm and comfortable  Respiratory system: Clear to auscultation. Respiratory effort normal. Cardiovascular system: S1 & S2 heard, RRR. No JVD, murmurs, rubs, gallops or clicks. No pedal edema. Gastrointestinal system: Abdomen is nondistended, soft and nontender. No organomegaly or masses felt. Normal bowel sounds heard. Central nervous system: Alert . Motor strength 5/5, vision improving  Extremities: Symmetric 5 x 5 power. Skin: No rashes, lesions or ulcers    Data Reviewed: I have personally reviewed following labs and imaging studies  CBC:  Recent Labs Lab 06/27/15 0328 06/28/15 0405 06/29/15 0311 06/30/15 0001 06/30/15 0108 06/30/15 1023 07/01/15 0327  WBC 12.0* 19.3* 17.4*  --  16.3*  --  18.1*  NEUTROABS 10.5* 15.7* 13.4*  --  12.2*  --  12.5*  HGB 7.4* 7.2* 7.0* 6.8* 6.8* 9.5* 7.8*  HCT 23.0* 22.1* 21.7* 21.0* 21.1* 28.7* 23.8*  MCV 93.9 94.8 96.4  --  93.8  --  94.1  PLT 632* 557* 434*  --  413*  --  158*   Basic Metabolic Panel:  Recent Labs Lab 06/27/15 0328 06/28/15 0405 06/29/15 0311 06/30/15 0108 07/01/15 0327  NA 135 134* 137 134* 133*  K 3.5 3.3* 3.3* 3.5 3.6  CL 106 103 109 103 103  CO2 19* 20* 18* 19* 16*  GLUCOSE 194* 107* 123* 146* 100*  BUN 53* 65* 69* 79* 91*  CREATININE 3.73* 3.84* 3.53* 4.05* 4.74*  CALCIUM 9.4 8.5* 8.3* 8.4* 8.2*   GFR: Estimated Creatinine Clearance: 17.1 mL/min (by C-G formula based on Cr of 4.74). Liver Function Tests:  Recent Labs Lab 06/27/15 0328 06/28/15 0405  AST 10* 14*  ALT 11* 14*  ALKPHOS 111 131*  BILITOT 0.7 0.9  PROT 7.0 6.5  ALBUMIN 1.6* 1.6*   No results for input(s): LIPASE, AMYLASE in the last 168 hours. No results for input(s): AMMONIA in the last 168 hours. Coagulation Profile: No results for input(s): INR, PROTIME in the last 168  hours. Cardiac Enzymes: No results for input(s): CKTOTAL, CKMB, CKMBINDEX, TROPONINI in the last 168 hours. BNP (last 3 results) No results for input(s): PROBNP in the last 8760 hours. HbA1C: No results for input(s): HGBA1C in the last 72 hours. CBG: No results for input(s): GLUCAP in the last 168 hours. Lipid Profile: No results for input(s): CHOL, HDL, LDLCALC, TRIG, CHOLHDL, LDLDIRECT in the last 72 hours. Thyroid Function Tests: No results for input(s): TSH, T4TOTAL, FREET4, T3FREE, THYROIDAB in the last 72 hours. Anemia Panel: No results for input(s): VITAMINB12, FOLATE, FERRITIN, TIBC, IRON, RETICCTPCT in the last 72 hours. Sepsis Labs: No results for input(s): PROCALCITON, LATICACIDVEN in the last 168 hours.  Recent Results (from the past 240 hour(s))  Urine culture     Status: Abnormal   Collection Time: 06/23/15  2:30 PM  Result Value Ref Range Status  Specimen Description URINE, CLEAN CATCH  Final   Special Requests NONE  Final   Culture MULTIPLE SPECIES PRESENT, SUGGEST RECOLLECTION (A)  Final   Report Status 06/25/2015 FINAL  Final  Blood Culture (routine x 2)     Status: None   Collection Time: 06/23/15  2:45 PM  Result Value Ref Range Status   Specimen Description BLOOD RIGHT ANTECUBITAL  Final   Special Requests BOTTLES DRAWN AEROBIC AND ANAEROBIC 5 CC EA  Final   Culture   Final    NO GROWTH 5 DAYS Performed at [facility]    Report Status 06/28/2015 FINAL  Final  Blood Culture (routine x 2)     Status: None   Collection Time: 06/23/15  3:29 PM  Result Value Ref Range Status   Specimen Description BLOOD RIGHT HAND  Final   Special Requests BOTTLES DRAWN AEROBIC ONLY 9ML  Final   Culture   Final    NO GROWTH 5 DAYS Performed at [facility]    Report Status 06/28/2015 FINAL  Final  Culture, blood (routine x 2) Call MD if unable to obtain prior to antibiotics being given     Status: None   Collection Time: 06/25/15  5:05 PM  Result Value  Ref Range Status   Specimen Description BLOOD RIGHT ARM  Final   Special Requests BOTTLES DRAWN AEROBIC AND ANAEROBIC 8 CC EA  Final   Culture   Final    NO GROWTH 5 DAYS Performed at [facility]    Report Status 06/30/2015 FINAL  Final  Culture, blood (routine x 2) Call MD if unable to obtain prior to antibiotics being given     Status: None   Collection Time: 06/25/15  5:08 PM  Result Value Ref Range Status   Specimen Description BLOOD RIGHT HAND  Final   Special Requests BOTTLES DRAWN AEROBIC AND ANAEROBIC 10 CC EA  Final   Culture   Final    NO GROWTH 5 DAYS Performed at [facility]    Report Status 06/30/2015 FINAL  Final  Urine culture     Status: None   Collection Time: 06/25/15  9:17 PM  Result Value Ref Range Status   Specimen Description URINE, CLEAN CATCH  Final   Special Requests NONE  Final   Culture NO GROWTH Performed at [facility]   Final   Report Status 06/27/2015 FINAL  Final  MRSA PCR Screening     Status: None   Collection Time: 06/26/15  5:00 PM  Result Value Ref Range Status   MRSA by PCR NEGATIVE NEGATIVE Final    Comment:        The GeneXpert MRSA Assay (FDA approved for NASAL specimens only), is one component of a comprehensive MRSA colonization surveillance program. It is not intended to diagnose MRSA infection nor to guide or monitor treatment for MRSA infections.          Radiology Studies: Ct Abdomen Pelvis Wo Contrast  06/30/2015  CLINICAL DATA:  History of multiple myeloma with chronic kidney disease stage 3 and back pain currently undergoing chemotherapy. Nausea, vomiting, diarrhea, fever and chills four days. EXAM: CT CHEST, ABDOMEN AND PELVIS WITHOUT CONTRAST TECHNIQUE: Multidetector CT imaging of the chest, abdomen and pelvis was performed following the standard protocol without IV contrast. COMPARISON:  CT 06/23/2015, 02/03/2014 and 01/30/2014 FINDINGS: CT CHEST Lungs are adequately inflated demonstrate a  small amount of bilateral pleural fluid left greater than right with associated atelectasis  in the posterior lung bases left greater than right. There are new multiple bilateral well-defined focal pleural masses right greater than left with the largest over the right apex measuring 2.8 x 5.2 cm. These likely represent spread of patient's known multiple myeloma. Airways are normal. Heart is normal size. No definite mediastinal or hilar adenopathy. Small amount contrast within the mid to distal esophagus likely due to dysmotility versus reflux. Remaining mediastinal structures are unremarkable. There are couple hypodense thyroid nodules with the largest measuring 1.2 cm over the left lobe. Patient's known destructive sternal manubrial lesion is unchanged. There is subtle increased lucency over the body of the sternum which is new likely spread of patient's known myeloma. There are several lucencies over the thoracic spine with the most prominent over the T9 vertebral body. CT ABDOMEN AND PELVIS The liver, spleen, gallbladder, pancreas and adrenal glands are within normal. Kidneys normal size without hydronephrosis or nephrolithiasis. Stable exophytic hypodensity over the mid pole cortex of the left kidney likely a cyst. Ureters are normal. Stomach is within normal. Small bowel is normal. Appendix is normal. Subtle diverticulosis of the colon most prominent over the right colon. There is no significant free fluid or focal inflammatory change. Few small periaortic lymph nodes. Vascular structures are unremarkable. Pelvic images demonstrate the bladder, prostate and rectum to be within normal. Ill-defined low-attenuation over the right iliac bone likely a myelomatous lesion. Ill-defined lytic process over the right femoral neck spanning nearly the entire width of the femoral neck increasing risk of pathologic fracture. Bone island over the left sacrum. IMPRESSION: No acute findings in the chest, abdomen or pelvis. Lytic  lesions within the sternum, spine, right iliac bone and right femoral neck compatible with patient's known myeloma. Note that patient is at risk for pathologic fracture through the right femoral neck. Multiple new bilateral focal pleural masses with the largest over the right apex measuring 2.8 x 5.2 cm likely metastatic disease from patient's multiple myeloma. Small amount of bilateral pleural fluid bibasilar consolidation most typical of atelectasis. Minimal diverticulosis of the colon. Stable 1.3 cm exophytic hypodensity over the mid pole left kidney likely a cyst. Couple small hypodense thyroid nodules with the larger over the left lobe measuring 1.2 cm. Recommend follow-up as clinically indicated. Electronically Signed   By: Marin Olp M.D.   On: 06/30/2015 17:24   Ct Chest Wo Contrast  06/30/2015  CLINICAL DATA:  History of multiple myeloma with chronic kidney disease stage 3 and back pain currently undergoing chemotherapy. Nausea, vomiting, diarrhea, fever and chills four days. EXAM: CT CHEST, ABDOMEN AND PELVIS WITHOUT CONTRAST TECHNIQUE: Multidetector CT imaging of the chest, abdomen and pelvis was performed following the standard protocol without IV contrast. COMPARISON:  CT 06/23/2015, 02/03/2014 and 01/30/2014 FINDINGS: CT CHEST Lungs are adequately inflated demonstrate a small amount of bilateral pleural fluid left greater than right with associated atelectasis in the posterior lung bases left greater than right. There are new multiple bilateral well-defined focal pleural masses right greater than left with the largest over the right apex measuring 2.8 x 5.2 cm. These likely represent spread of patient's known multiple myeloma. Airways are normal. Heart is normal size. No definite mediastinal or hilar adenopathy. Small amount contrast within the mid to distal esophagus likely due to dysmotility versus reflux. Remaining mediastinal structures are unremarkable. There are couple hypodense thyroid  nodules with the largest measuring 1.2 cm over the left lobe. Patient's known destructive sternal manubrial lesion is unchanged. There is subtle increased lucency  over the body of the sternum which is new likely spread of patient's known myeloma. There are several lucencies over the thoracic spine with the most prominent over the T9 vertebral body. CT ABDOMEN AND PELVIS The liver, spleen, gallbladder, pancreas and adrenal glands are within normal. Kidneys normal size without hydronephrosis or nephrolithiasis. Stable exophytic hypodensity over the mid pole cortex of the left kidney likely a cyst. Ureters are normal. Stomach is within normal. Small bowel is normal. Appendix is normal. Subtle diverticulosis of the colon most prominent over the right colon. There is no significant free fluid or focal inflammatory change. Few small periaortic lymph nodes. Vascular structures are unremarkable. Pelvic images demonstrate the bladder, prostate and rectum to be within normal. Ill-defined low-attenuation over the right iliac bone likely a myelomatous lesion. Ill-defined lytic process over the right femoral neck spanning nearly the entire width of the femoral neck increasing risk of pathologic fracture. Bone island over the left sacrum. IMPRESSION: No acute findings in the chest, abdomen or pelvis. Lytic lesions within the sternum, spine, right iliac bone and right femoral neck compatible with patient's known myeloma. Note that patient is at risk for pathologic fracture through the right femoral neck. Multiple new bilateral focal pleural masses with the largest over the right apex measuring 2.8 x 5.2 cm likely metastatic disease from patient's multiple myeloma. Small amount of bilateral pleural fluid bibasilar consolidation most typical of atelectasis. Minimal diverticulosis of the colon. Stable 1.3 cm exophytic hypodensity over the mid pole left kidney likely a cyst. Couple small hypodense thyroid nodules with the larger over  the left lobe measuring 1.2 cm. Recommend follow-up as clinically indicated. Electronically Signed   By: Marin Olp M.D.   On: 06/30/2015 17:24        Scheduled Meds: . sodium chloride  250 mL Intravenous Once  . acyclovir  400 mg Oral Daily  . docusate sodium  200 mg Oral Daily  . enoxaparin (LOVENOX) injection  30 mg Subcutaneous Q24H  . feeding supplement (ENSURE ENLIVE)  237 mL Oral TID BM  . hydrALAZINE  75 mg Oral Q8H  . lenalidomide  10 mg Oral Q48H  . metoprolol tartrate  75 mg Oral BID  . mirtazapine  15 mg Oral QHS  . morphine  30 mg Oral Q12H  . polyethylene glycol  17 g Oral Daily   Continuous Infusions: . sodium chloride 50 mL/hr at 07/01/15 1642  . niCARDipine Stopped (07/01/15 0831)     LOS: 8 days    Time spent: 35 minutes.     Florencia Reasons, MD PhD [facility] Pager [phone]  If 7PM-7AM, please contact night-coverage [email or web address] Password [facility] 07/01/2015, 10:24 PM

## 2015-07-01 NOTE — Telephone Encounter (Signed)
Called the floor ICU for patient in 1230,spoke with Pam,RN, patient to be in radiation dept  For ct simulation to his chest and spine at 3pm, please medicate for pain if patient needed, he will be lying on hard ct table machine for 1 hour, he has IVF'S  Normal saline infusing, off cardizem gtt,thaked Pam, called ct siom spoke with Candace, RT therapist 8:25 AM

## 2015-07-01 NOTE — Progress Notes (Signed)
Checked Carfilzomib based on BSA and usual dosing with Jena Gauss, RN.

## 2015-07-02 ENCOUNTER — Ambulatory Visit
Admit: 2015-07-02 | Discharge: 2015-07-02 | Disposition: A | Discharge status: Home / Self Care | Payer: Self-pay | Attending: Radiation Oncology | Admitting: Radiation Oncology

## 2015-07-02 ENCOUNTER — Ambulatory Visit: Payer: Self-pay | Admitting: Radiation Oncology

## 2015-07-02 ENCOUNTER — Telehealth: Payer: Self-pay | Admitting: *Deleted

## 2015-07-02 ENCOUNTER — Inpatient Hospital Stay (HOSPITAL_COMMUNITY): Payer: Medicaid Other

## 2015-07-02 ENCOUNTER — Ambulatory Visit: Payer: Self-pay

## 2015-07-02 DIAGNOSIS — C7951 Secondary malignant neoplasm of bone: Secondary | ICD-10-CM

## 2015-07-02 DIAGNOSIS — N182 Chronic kidney disease, stage 2 (mild): Secondary | ICD-10-CM

## 2015-07-02 DIAGNOSIS — Z9289 Personal history of other medical treatment: Secondary | ICD-10-CM

## 2015-07-02 DIAGNOSIS — G893 Neoplasm related pain (acute) (chronic): Secondary | ICD-10-CM

## 2015-07-02 HISTORY — DX: Personal history of other medical treatment: Z92.89

## 2015-07-02 LAB — BASIC METABOLIC PANEL
Anion gap: 15 (ref 5–15)
BUN: 101 mg/dL — AB (ref 6–20)
CHLORIDE: 102 mmol/L (ref 101–111)
CO2: 15 mmol/L — ABNORMAL LOW (ref 22–32)
CREATININE: 6.34 mg/dL — AB (ref 0.61–1.24)
Calcium: 7.7 mg/dL — ABNORMAL LOW (ref 8.9–10.3)
GFR calc Af Amer: 11 mL/min — ABNORMAL LOW (ref >60)
GFR calc non Af Amer: 10 mL/min — ABNORMAL LOW (ref >60)
GLUCOSE: 154 mg/dL — AB (ref 65–99)
POTASSIUM: 4.6 mmol/L (ref 3.5–5.1)
SODIUM: 132 mmol/L — AB (ref 135–145)

## 2015-07-02 LAB — CBC WITH DIFFERENTIAL/PLATELET
BASOS PCT: 0 %
Basophils Absolute: 0 10*3/uL (ref 0.0–0.1)
EOS ABS: 0 10*3/uL (ref 0.0–0.7)
EOS PCT: 0 %
HCT: 26.3 % — ABNORMAL LOW (ref 39.0–52.0)
HEMOGLOBIN: 8.6 g/dL — AB (ref 13.0–17.0)
Lymphocytes Relative: 7 %
Lymphs Abs: 1.2 10*3/uL (ref 0.7–4.0)
MCH: 30.7 pg (ref 26.0–34.0)
MCHC: 32.7 g/dL (ref 30.0–36.0)
MCV: 93.9 fL (ref 78.0–100.0)
MONO ABS: 1.9 10*3/uL — AB (ref 0.1–1.0)
Monocytes Relative: 11 %
NEUTROS ABS: 14.3 10*3/uL — AB (ref 1.7–7.7)
Neutrophils Relative %: 82 %
PLATELETS: 507 10*3/uL — AB (ref 150–400)
RBC: 2.8 MIL/uL — ABNORMAL LOW (ref 4.22–5.81)
RDW: 15 % (ref 11.5–15.5)
WBC: 17.4 10*3/uL — ABNORMAL HIGH (ref 4.0–10.5)

## 2015-07-02 MED ORDER — METHYLPREDNISOLONE SODIUM SUCC 125 MG IJ SOLR: 125.0000 mg | Freq: Once | INTRAMUSCULAR | Status: DC | PRN

## 2015-07-02 MED ORDER — FAMOTIDINE IN NACL 20-0.9 MG/50ML-% IV SOLN
20.0000 mg | Freq: Once | INTRAVENOUS | Status: DC | PRN
  Filled 2015-07-02: qty 50

## 2015-07-02 MED ORDER — SENNOSIDES-DOCUSATE SODIUM 8.6-50 MG PO TABS
2.0000 | ORAL_TABLET | Freq: Two times a day (BID) | ORAL | Status: DC
  Administered 2015-07-02 – 2015-07-10 (×16): 2 via ORAL
  Filled 2015-07-02 (×16): qty 2

## 2015-07-02 MED ORDER — EPINEPHRINE HCL 1 MG/ML IJ SOLN
0.5000 mg | Freq: Once | INTRAMUSCULAR | Status: DC | PRN
  Filled 2015-07-02: qty 1

## 2015-07-02 MED ORDER — DIPHENHYDRAMINE HCL 50 MG/ML IJ SOLN: 25.0000 mg | Freq: Once | INTRAMUSCULAR | Status: DC | PRN

## 2015-07-02 MED ORDER — PROCHLORPERAZINE MALEATE 10 MG PO TABS
10.0000 mg | ORAL_TABLET | Freq: Once | ORAL | Status: AC
  Administered 2015-07-02: 10 mg via ORAL
  Filled 2015-07-02: qty 1

## 2015-07-02 MED ORDER — SODIUM CHLORIDE 0.9 % IV SOLN
Freq: Once | INTRAVENOUS | Status: AC
  Administered 2015-07-02: 11:00:00 via INTRAVENOUS

## 2015-07-02 MED ORDER — HEPARIN SOD (PORK) LOCK FLUSH 100 UNIT/ML IV SOLN
500.0000 [IU] | Freq: Once | INTRAVENOUS | Status: DC | PRN
  Filled 2015-07-02: qty 5

## 2015-07-02 MED ORDER — SODIUM CHLORIDE 0.9 % IV SOLN: Freq: Once | INTRAVENOUS | Status: DC | PRN

## 2015-07-02 MED ORDER — EPINEPHRINE HCL 0.1 MG/ML IJ SOSY
0.2500 mg | PREFILLED_SYRINGE | Freq: Once | INTRAMUSCULAR | Status: DC | PRN
  Filled 2015-07-02: qty 10

## 2015-07-02 MED ORDER — SODIUM CHLORIDE 0.9 % IV SOLN
10.0000 mg | Freq: Once | INTRAVENOUS | Status: AC
  Administered 2015-07-02: 10 mg via INTRAVENOUS
  Filled 2015-07-02: qty 1

## 2015-07-02 MED ORDER — METOPROLOL TARTRATE 50 MG PO TABS
50.0000 mg | ORAL_TABLET | Freq: Two times a day (BID) | ORAL | Status: DC
  Administered 2015-07-02 – 2015-07-10 (×14): 50 mg via ORAL
  Filled 2015-07-02 (×14): qty 1

## 2015-07-02 MED ORDER — ALBUTEROL SULFATE (2.5 MG/3ML) 0.083% IN NEBU: 2.5000 mg | INHALATION_SOLUTION | Freq: Once | RESPIRATORY_TRACT | Status: DC | PRN

## 2015-07-02 MED ORDER — SODIUM CHLORIDE 0.9 % IV SOLN
Freq: Once | INTRAVENOUS | Status: AC
  Administered 2015-07-02: 12:00:00 via INTRAVENOUS

## 2015-07-02 MED ORDER — SODIUM CHLORIDE 0.9% FLUSH: 3.0000 mL | INTRAVENOUS | Status: DC | PRN

## 2015-07-02 MED ORDER — HYDRALAZINE HCL 25 MG PO TABS
25.0000 mg | ORAL_TABLET | Freq: Three times a day (TID) | ORAL | Status: DC
  Administered 2015-07-02 – 2015-07-07 (×13): 25 mg via ORAL
  Administered 2015-07-08: 30 mg via ORAL
  Administered 2015-07-08 – 2015-07-10 (×6): 25 mg via ORAL
  Filled 2015-07-02 (×22): qty 1

## 2015-07-02 MED ORDER — SODIUM CHLORIDE 0.9% FLUSH: 10.0000 mL | INTRAVENOUS | Status: DC | PRN

## 2015-07-02 MED ORDER — FUROSEMIDE 10 MG/ML IJ SOLN
80.0000 mg | Freq: Once | INTRAMUSCULAR | Status: AC
  Administered 2015-07-02: 80 mg via INTRAVENOUS
  Filled 2015-07-02: qty 8

## 2015-07-02 MED ORDER — SODIUM CHLORIDE 0.9 % IV SOLN: 10.0000 mg | Freq: Once | INTRAVENOUS | Status: DC

## 2015-07-02 MED ORDER — HEPARIN SOD (PORK) LOCK FLUSH 100 UNIT/ML IV SOLN
250.0000 [IU] | Freq: Once | INTRAVENOUS | Status: DC | PRN
  Filled 2015-07-02: qty 3

## 2015-07-02 MED ORDER — ALTEPLASE 2 MG IJ SOLR
2.0000 mg | Freq: Once | INTRAMUSCULAR | Status: DC | PRN
  Filled 2015-07-02: qty 2

## 2015-07-02 MED ORDER — DIPHENHYDRAMINE HCL 50 MG/ML IJ SOLN: 50.0000 mg | Freq: Once | INTRAMUSCULAR | Status: DC | PRN

## 2015-07-02 MED ORDER — DEXTROSE 5 % IV SOLN
20.0000 mg/m2 | Freq: Once | INTRAVENOUS | Status: AC
  Administered 2015-07-02: 34 mg via INTRAVENOUS
  Filled 2015-07-02: qty 17

## 2015-07-02 NOTE — Telephone Encounter (Signed)
Called the RN Janett Billow, apologized  To her statis I didn't know the plan wasn't ready to treat the patient until 1215pm, could the patient's chemotherapy infusing with pre med decadron be done by then, she stated yes, they would start it now, thanked Therapist, sports, then gave status to [place] #1 therapists, Sharlene Dory and Dewey  10:51 AM

## 2015-07-02 NOTE — Progress Notes (Signed)
Courvoisier Montecalvo   DOB:1975/04/03   GU#:[id number]   [id number]  Subjective: Ralphie is currently at MRI-- no family in room  Objective: .  Filed Vitals:   07/01/15 2255 07/02/15 0630  BP: 112/64 117/69  Pulse:  73  Temp:  97.6 F (36.4 C)  Resp:  20    Body mass index is 30.4 kg/(m^2).  Intake/Output Summary (Last 24 hours) at 07/02/15 0744 Last data filed at 07/02/15 0700  Gross per 24 hour  Intake 1593.67 ml  Output    350 ml  Net 1243.67 ml    Note BP better controlled on current meds  CBG (last 3)  No results for input(s): GLUCAP in the last 72 hours.   Labs:  Lab Results  Component Value Date   WBC 17.4* 07/02/2015   HGB 8.6* 07/02/2015   HCT 26.3* 07/02/2015   MCV 93.9 07/02/2015   PLT 507* 07/02/2015   NEUTROABS 14.3* 07/02/2015    _0 @  Urine Studies No results for input(s): UHGB, CRYS in the last 72 hours.  Invalid input(s): UACOL, UAPR, USPG, UPH, UTP, UGL, UKET, UBIL, UNIT, UROB, [place], UEPI, UWBC, [place], [place], [place], [place], [place]  Basic Metabolic Panel:  Recent Labs Lab 06/28/15 0405 06/29/15 0311 06/30/15 0108 07/01/15 0327 07/02/15 0511  NA 134* 137 134* 133* 132*  K 3.3* 3.3* 3.5 3.6 4.6  CL 103 109 103 103 102  CO2 20* 18* 19* 16* 15*  GLUCOSE 107* 123* 146* 100* 154*  BUN 65* 69* 79* 91* 101*  CREATININE 3.84* 3.53* 4.05* 4.74* 6.34*  CALCIUM 8.5* 8.3* 8.4* 8.2* 7.7*   Ref Range 4d ago  81moago  666mogo      Kappa free light chain 3.30 - 19.40 mg/L 145.75 (H) 12.40 (H)R 10.50 (H)R       M-Spike, % Not Observed g/dL 1.8 (H)       Results for JOLIONELL, MATUSZAKMRN [id number] of 07/02/2015 07:45  Ref. Range 06/28/2015 04:05 06/29/2015 03:11 06/30/2015 01:08 07/01/2015 03:27 07/02/2015 05:11  Creatinine Latest Ref Range: 0.61-1.24 mg/dL 3.84 (H) 3.53 (H) 4.05 (H) 4.74 (H) 6.34 (H)    GFR Estimated Creatinine Clearance: 12.7 mL/min (by C-G formula based on Cr of 6.34). Liver Function Tests:  Recent Labs Lab  06/27/15 0328 06/28/15 0405  AST 10* 14*  ALT 11* 14*  ALKPHOS 111 131*  BILITOT 0.7 0.9  PROT 7.0 6.5  ALBUMIN 1.6* 1.6*   No results for input(s): LIPASE, AMYLASE in the last 168 hours. No results for input(s): AMMONIA in the last 168 hours. Coagulation profile No results for input(s): INR, PROTIME in the last 168 hours.  CBC:  Recent Labs Lab 06/28/15 0405 06/29/15 0311 06/30/15 0001 06/30/15 0108 06/30/15 1023 07/01/15 0327 07/02/15 0511  WBC 19.3* 17.4*  --  16.3*  --  18.1* 17.4*  NEUTROABS 15.7* 13.4*  --  12.2*  --  12.5* 14.3*  HGB 7.2* 7.0* 6.8* 6.8* 9.5* 7.8* 8.6*  HCT 22.1* 21.7* 21.0* 21.1* 28.7* 23.8* 26.3*  MCV 94.8 96.4  --  93.8  --  94.1 93.9  PLT 557* 434*  --  413*  --  454* 507*   Cardiac Enzymes: No results for input(s): CKTOTAL, CKMB, CKMBINDEX, TROPONINI in the last 168 hours. BNP: Invalid input(s): POCBNP CBG: No results for input(s): GLUCAP in the last 168 hours. D-Dimer No results for input(s): DDIMER in the last 72 hours. Hgb A1c No results for input(s): HGBA1C in the last 72 hours. Lipid  Profile No results for input(s): CHOL, HDL, LDLCALC, TRIG, CHOLHDL, LDLDIRECT in the last 72 hours. Thyroid function studies No results for input(s): TSH, T4TOTAL, T3FREE, THYROIDAB in the last 72 hours.  Invalid input(s): FREET3 Anemia work up No results for input(s): VITAMINB12, FOLATE, FERRITIN, TIBC, IRON, RETICCTPCT in the last 72 hours. Microbiology Recent Results (from the past 240 hour(s))  Urine culture     Status: Abnormal   Collection Time: 06/23/15  2:30 PM  Result Value Ref Range Status   Specimen Description URINE, CLEAN CATCH  Final   Special Requests NONE  Final   Culture MULTIPLE SPECIES PRESENT, SUGGEST RECOLLECTION (A)  Final   Report Status 06/25/2015 FINAL  Final  Blood Culture (routine x 2)     Status: None   Collection Time: 06/23/15  2:45 PM  Result Value Ref Range Status   Specimen Description BLOOD RIGHT ANTECUBITAL   Final   Special Requests BOTTLES DRAWN AEROBIC AND ANAEROBIC 5 CC EA  Final   Culture   Final    NO GROWTH 5 DAYS Performed at [facility]    Report Status 06/28/2015 FINAL  Final  Blood Culture (routine x 2)     Status: None   Collection Time: 06/23/15  3:29 PM  Result Value Ref Range Status   Specimen Description BLOOD RIGHT HAND  Final   Special Requests BOTTLES DRAWN AEROBIC ONLY 9ML  Final   Culture   Final    NO GROWTH 5 DAYS Performed at [facility]    Report Status 06/28/2015 FINAL  Final  Culture, blood (routine x 2) Call MD if unable to obtain prior to antibiotics being given     Status: None   Collection Time: 06/25/15  5:05 PM  Result Value Ref Range Status   Specimen Description BLOOD RIGHT ARM  Final   Special Requests BOTTLES DRAWN AEROBIC AND ANAEROBIC 8 CC EA  Final   Culture   Final    NO GROWTH 5 DAYS Performed at [facility]    Report Status 06/30/2015 FINAL  Final  Culture, blood (routine x 2) Call MD if unable to obtain prior to antibiotics being given     Status: None   Collection Time: 06/25/15  5:08 PM  Result Value Ref Range Status   Specimen Description BLOOD RIGHT HAND  Final   Special Requests BOTTLES DRAWN AEROBIC AND ANAEROBIC 10 CC EA  Final   Culture   Final    NO GROWTH 5 DAYS Performed at [facility]    Report Status 06/30/2015 FINAL  Final  Urine culture     Status: None   Collection Time: 06/25/15  9:17 PM  Result Value Ref Range Status   Specimen Description URINE, CLEAN CATCH  Final   Special Requests NONE  Final   Culture NO GROWTH Performed at [facility]   Final   Report Status 06/27/2015 FINAL  Final  MRSA PCR Screening     Status: None   Collection Time: 06/26/15  5:00 PM  Result Value Ref Range Status   MRSA by PCR NEGATIVE NEGATIVE Final    Comment:        The GeneXpert MRSA Assay (FDA approved for NASAL specimens only), is one component of a comprehensive MRSA  colonization surveillance program. It is not intended to diagnose MRSA infection nor to guide or monitor treatment for MRSA infections.       Studies:  Ct Abdomen Pelvis Wo Contrast  06/30/2015  CLINICAL DATA:  History of multiple myeloma with chronic kidney disease stage 3 and back pain currently undergoing chemotherapy. Nausea, vomiting, diarrhea, fever and chills four days. EXAM: CT CHEST, ABDOMEN AND PELVIS WITHOUT CONTRAST TECHNIQUE: Multidetector CT imaging of the chest, abdomen and pelvis was performed following the standard protocol without IV contrast. COMPARISON:  CT 06/23/2015, 02/03/2014 and 01/30/2014 FINDINGS: CT CHEST Lungs are adequately inflated demonstrate a small amount of bilateral pleural fluid left greater than right with associated atelectasis in the posterior lung bases left greater than right. There are new multiple bilateral well-defined focal pleural masses right greater than left with the largest over the right apex measuring 2.8 x 5.2 cm. These likely represent spread of patient's known multiple myeloma. Airways are normal. Heart is normal size. No definite mediastinal or hilar adenopathy. Small amount contrast within the mid to distal esophagus likely due to dysmotility versus reflux. Remaining mediastinal structures are unremarkable. There are couple hypodense thyroid nodules with the largest measuring 1.2 cm over the left lobe. Patient's known destructive sternal manubrial lesion is unchanged. There is subtle increased lucency over the body of the sternum which is new likely spread of patient's known myeloma. There are several lucencies over the thoracic spine with the most prominent over the T9 vertebral body. CT ABDOMEN AND PELVIS The liver, spleen, gallbladder, pancreas and adrenal glands are within normal. Kidneys normal size without hydronephrosis or nephrolithiasis. Stable exophytic hypodensity over the mid pole cortex of the left kidney likely a cyst. Ureters are  normal. Stomach is within normal. Small bowel is normal. Appendix is normal. Subtle diverticulosis of the colon most prominent over the right colon. There is no significant free fluid or focal inflammatory change. Few small periaortic lymph nodes. Vascular structures are unremarkable. Pelvic images demonstrate the bladder, prostate and rectum to be within normal. Ill-defined low-attenuation over the right iliac bone likely a myelomatous lesion. Ill-defined lytic process over the right femoral neck spanning nearly the entire width of the femoral neck increasing risk of pathologic fracture. Bone island over the left sacrum. IMPRESSION: No acute findings in the chest, abdomen or pelvis. Lytic lesions within the sternum, spine, right iliac bone and right femoral neck compatible with patient's known myeloma. Note that patient is at risk for pathologic fracture through the right femoral neck. Multiple new bilateral focal pleural masses with the largest over the right apex measuring 2.8 x 5.2 cm likely metastatic disease from patient's multiple myeloma. Small amount of bilateral pleural fluid bibasilar consolidation most typical of atelectasis. Minimal diverticulosis of the colon. Stable 1.3 cm exophytic hypodensity over the mid pole left kidney likely a cyst. Couple small hypodense thyroid nodules with the larger over the left lobe measuring 1.2 cm. Recommend follow-up as clinically indicated. Electronically Signed   By: Marin Olp M.D.   On: 06/30/2015 17:24   Ct Chest Wo Contrast  06/30/2015  CLINICAL DATA:  History of multiple myeloma with chronic kidney disease stage 3 and back pain currently undergoing chemotherapy. Nausea, vomiting, diarrhea, fever and chills four days. EXAM: CT CHEST, ABDOMEN AND PELVIS WITHOUT CONTRAST TECHNIQUE: Multidetector CT imaging of the chest, abdomen and pelvis was performed following the standard protocol without IV contrast. COMPARISON:  CT 06/23/2015, 02/03/2014 and 01/30/2014  FINDINGS: CT CHEST Lungs are adequately inflated demonstrate a small amount of bilateral pleural fluid left greater than right with associated atelectasis in the posterior lung bases left greater than right. There are new multiple bilateral well-defined focal pleural masses right greater than left  with the largest over the right apex measuring 2.8 x 5.2 cm. These likely represent spread of patient's known multiple myeloma. Airways are normal. Heart is normal size. No definite mediastinal or hilar adenopathy. Small amount contrast within the mid to distal esophagus likely due to dysmotility versus reflux. Remaining mediastinal structures are unremarkable. There are couple hypodense thyroid nodules with the largest measuring 1.2 cm over the left lobe. Patient's known destructive sternal manubrial lesion is unchanged. There is subtle increased lucency over the body of the sternum which is new likely spread of patient's known myeloma. There are several lucencies over the thoracic spine with the most prominent over the T9 vertebral body. CT ABDOMEN AND PELVIS The liver, spleen, gallbladder, pancreas and adrenal glands are within normal. Kidneys normal size without hydronephrosis or nephrolithiasis. Stable exophytic hypodensity over the mid pole cortex of the left kidney likely a cyst. Ureters are normal. Stomach is within normal. Small bowel is normal. Appendix is normal. Subtle diverticulosis of the colon most prominent over the right colon. There is no significant free fluid or focal inflammatory change. Few small periaortic lymph nodes. Vascular structures are unremarkable. Pelvic images demonstrate the bladder, prostate and rectum to be within normal. Ill-defined low-attenuation over the right iliac bone likely a myelomatous lesion. Ill-defined lytic process over the right femoral neck spanning nearly the entire width of the femoral neck increasing risk of pathologic fracture. Bone island over the left sacrum.  IMPRESSION: No acute findings in the chest, abdomen or pelvis. Lytic lesions within the sternum, spine, right iliac bone and right femoral neck compatible with patient's known myeloma. Note that patient is at risk for pathologic fracture through the right femoral neck. Multiple new bilateral focal pleural masses with the largest over the right apex measuring 2.8 x 5.2 cm likely metastatic disease from patient's multiple myeloma. Small amount of bilateral pleural fluid bibasilar consolidation most typical of atelectasis. Minimal diverticulosis of the colon. Stable 1.3 cm exophytic hypodensity over the mid pole left kidney likely a cyst. Couple small hypodense thyroid nodules with the larger over the left lobe measuring 1.2 cm. Recommend follow-up as clinically indicated. Electronically Signed   By: Marin Olp M.D.   On: 06/30/2015 17:24    Assessment: 40 y.o. 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;   I: CyBorD started January 2016 (a) dexamethasone 20 mg/d every TU/WED started 02/04/2014 (b) bortezomib sQ days 1,4,8,11 of every 21 day cycle started 02/10/2014 (c) cyclophosphamide 300 mg/M2 IV weekly started 02/13/2014   2: Lenalidomide 7m daily started 07/26/2014, discontinued December 2016 with progression  (2) chronic kidney disease stage III--worsening creatinine; renal following  Results for JERICKSON, YAMASHIRO(MRN [id number] as of 07/01/2015 08:16  Ref. Range 06/27/2015 03:28 06/28/2015 04:05 06/29/2015 03:11  06/30/2015 01:08 07/01/2015 03:27  Creatinine Latest Ref Range: 0.61-1.24 mg/dL 3.73 (H) 3.84 (H) 3.53 (H) 4.05 (H) 4.74 (H)    (3) hypercalcemia: zolendronate started 02/10/2014-- repeat every 4 weeks initially, then every 12 weeks (a) changed to denosumab/Xgeva starting 02/26/2015 because of CKD, repeated every 12 weeks  (4) ID prophylaxis: (a) influenza and Pneumovax [PV13] vaccines 02/07/2014 (  b) acyclovir started 02/07/2014 (c) HIV negative 01/31/2014 (d) Pneumovax P23 too be given after March 2016  (5). The patient is not a transplant candidate for financial reasons  (6) bilateral foot rash: Started on Septra DS and Diflucan 09/03/2014, resolved  (7) Multiple Myeloma relapse in December 2016.  (a) starting 02/05/15: dexamethasone 40m x2 consecutive days weekly; bortezomib sQ weekly,  (b) Septra DS twice daily (c) acyclovir 401mdaily  (d) bortezomib/ dexamethasone discontinued after 05/06/2015 dose with evidence of progression  (8) started carfilzomib 05/28/2015, given with weekly low dose dexamethasone (a) tolerated cyclophosphamide poorly, discontinued after cycle 1 (b) carfilzomib started 05/28/2015, first cycle only 4 doses (c) taking dexamethasone 20 mg daily 2 days a week  (9) lenalidomide 10 mg po daily to be added, taken 21 days on, 7 days off  (10) uncontrolled pain: currently on MSContin BID with hydrocodone/APAP for breakthrough pain  (a) bowel prophylaxis with miraLAX, docusate  (b) palliative radiation pending  (11) fever: all cultures negative to date, currently AF; consider "tumor fever"    Plan:  Bryndon received day 1 cycle 2 of carfilzomib yesterday. Due for day 2 today. We also resumed lenalidomide, which given his current GFR we are dosing at 10 mg QOD  Appreciate Dr ScBarkley Brunsote re possible relationship of  PRES and carfilzomib-like drugs. At this point I would continue w current treatment plan as we have alternative explanation. If symptoms recur despite a well-controlled BP we would have to switch  Renal function is worse today--I am concerned patient may be heading towards HD  Appreciate ID input--agree MRI unlikely to show ID source and likely we are dealing with "tumor fever"-- currently off ABX  Unable to assess response to increase in pain meds yesterday as patient not in room at present.  From a myeloma point of view he is to continue radiation treatments through 06/14. He is scheduled in our office 06/06 for further carfilzomib treatment.   Not clear if he will be ready for d/c tomorrow--worsening renal function is limiting factor at this point.    MAChauncey CruelMD 07/02/2015  7:44 AM [facility]Pro[facility] [id number] [address]el. [phone]  Fax. [phone]

## 2015-07-02 NOTE — Progress Notes (Signed)
Patient ID: Charles [name], male   DOB: 1976-01-19, 40 y.o.   MRN: [id number]         [facility]    Date of Admission:  06/23/2015   He is no longer having fever and all recent blood cultures are negative. MRI shows evidence of extensive myeloma involving the thoracic and lumbar spine. There is no evidence of infection. I would continue observation off of antibiotics. I will sign off now but please call if we can be of further assistance while he is here.    MR THORACIC SPINE IMPRESSION 07/02/2015  Extensive and diffuse infiltration of the bone marrow with myeloma. No fracture.  Posterior extradural mass the T4 level causing mild spinal stenosis. Posterior epidural mass at the T7 level causing moderate spinal stenosis and mild compression of the cord.  MR LUMBAR SPINE IMPRESSION 07/02/2015  Diffuse bone marrow infiltration throughout the lumbar spine, sacrum, and pelvis. No fracture. No tumor is seen in the lumbar spinal canal.  No evidence of infection in the thoracic or lumbar spine.   Electronically Signed  By: Franchot Gallo M.D.  On: 07/02/2015 09:00          Charles [name], [place] [facility] [phone] pager   [phone] cell 02/03/2015, 1:32 PM

## 2015-07-02 NOTE — Telephone Encounter (Signed)
Received call from [facility] RT therapist they called for the patient to come down for cord cord compression treatment, spoke with patient's nurse Janett Billow who told them, patient was getting his chemotherapy , called the floor to speak with his RN Janett Billow, asked if chemo or the decadron was infusing yet, was told no, asked if we could come get him now, she daid okay and after I spoke with Hether and Ana, the plan isn't ready as yet, they will get hiim at 1215,asked them to convey this to the nurse 10:44 AM

## 2015-07-02 NOTE — Progress Notes (Signed)
Received patient transfer from [place]. VS taken placed on monitoring and verified. Oriented to the floor, spanish speaking verbalized understanding of english.

## 2015-07-02 NOTE — Progress Notes (Signed)
PROGRESS NOTE    Charles [name]  MHD:[id number] DOB: 10/14/1975 DOA: 06/23/2015 PCP: [name], MD   Brief Narrative: Charles [name] is a 40 y.o. male with medical history significant of Multiple Myeloma, CKD 3, back pain, currently undergoing chemotherapy, reports he was started on a new chemotherapeutic agent recently, last chemo was last week, went to the cancer center with Nausea, Vomiting, diarrhea, fever and chills x 4days and was sent to the [facility] ER. ED Course: Temp of 99.1, labs notable for creatinine of 4.2 up from 3.3 10 days ago   Patient admitted with Nausea, vomiting, diarrhea. Thought to be to chemo. Subsequently he develops Press syndrome , Malignant HTN, visual disturbabce and ?PNA. Neurology consulted.  BP better. He spike fevers, chest x ray consistent with ?PNA. He was started on Vancomycin and cefepime. Vancomycin stopped 5-30 due to worsening renal function. Nephrology consulted. Also spike fever 5-29 after several days of broad spectrum abx, ID consulted. Fever is deemed to be likely tumor fever, patient is off abx for now Mri spine showed MM progression and cord involvement, oncology has contacted IR. Worsening of renal function, nephrology recommended [facility] to be [facility].  Assessment & Plan:   Principal Problem:   AKI (acute kidney injury) ([place]) Active Problems:   Multiple myeloma (HCC)   CKD (chronic kidney disease) stage 3, GFR 30-59 ml/min   Nausea with vomiting   Blurry vision, bilateral   HTN (hypertension), malignant   PRES (posterior reversible encephalopathy syndrome)   FUO (fever of unknown origin)   Back pain   Multiple myeloma not having achieved remission (HCC)   Multiple myeloma in relapse (HCC)   Symptomatic anemia  HTN Emergency, Pres syndrome; (? side effect from protesome inhibitor? Please see oncology and renal notes) Patient report vision changes the morning 5-26, MRI consistent with Pres.   Patient was started on nicardipine drip  neurology consulted recommended repeat  MRI in 1 or 2 weeks from 5/26. off nicardipine  Gtt on 5/31,  Patient was transitioned to Hydralazine 81m tid and and metoprolol 781mbid  On 6/1, sbp in low 100's to 110's, decrease hydralazine to 25tid and lopressor to 5064mid on 6/1, continue titrate bp meds to achieve goal.  SBP goal 120--140  Per neurology  Fevers;  chest x ray ? PNA. Uti? Urine multiples bacteria, repeated urine culture no growth to date.   Received cefepime and  vancomycin for 5 days.  WBC fluctuates. Still Spike fever 5-30 ID consulted due to persistent fevers. Pan CT unrevealing, MRI spine no infection  Fever possible from multiple myeloma,  ID recommends observe off abx, and signed off  [place] 3, baseline is 3.3 - US Koreagative for hydronephrosis.  -cr increasing,  Renal consulted who recommended transfer to [place] for dialysis, patient placement notified.   [place],  --restarted treatment with carfizomib, lenalidomide, dexamethasone, on 5/31, continue acyclovir prophylaxis. Prn pain control -mri spine with cord compression, oncology talked to rad onc -Oncology Dr MagJana Hakimput appreciated    Nausea with vomiting on admission -could be from chemo, ? Infection, Ct ab no obstruction. -seems resolved    Anemia; secondary to MM;.  Received one unit 5-30. Keep hgb> 7.   DVT prophylaxis; Lovenox.  Code Status: full code.  Family Communication: care discussed with patient and multiple family member in room and wife over the phone on 6/1 Disposition Plan: transfer  from [facility] to [place] for dialysis   Consultants:   Oncology Dr Jana Hakim.   Neurology   ID  nephrology  Procedures;   Renal US; medical renal disease, no hydronephrosis.   Antimicrobials:  Ceftriaxone 5-24--cefepime 5-25  Vancomycin 5-25   Subjective: no hallucination, vision has  improved. bp better, reports feeling better No fever last 24hrs Multiple family members in room  Objective: Filed Vitals:   07/02/15 1155 07/02/15 1210 07/02/15 1229 07/02/15 1403  BP: 106/65 116/68  113/71  Pulse: 71 72  72  Temp: 98.3 F (36.8 C) 97.6 F (36.4 C)  97.5 F (36.4 C)  TempSrc: Oral Oral  Oral  Resp:    16  Height:      Weight:      SpO2: 98% 99% 84% 100%    Intake/Output Summary (Last 24 hours) at 07/02/15 1655 Last data filed at 07/02/15 0800  Gross per 24 hour  Intake 996.67 ml  Output    150 ml  Net 846.67 ml   Filed Weights   06/30/15 0300 07/01/15 0341 07/01/15 1638  Weight: 68.1 kg (150 lb 2.1 oz) 69.9 kg (154 lb 1.6 oz) 70.6 kg (155 lb 10.3 oz)    Examination:  General exam: pale, NDA  Respiratory system: Clear to auscultation. Respiratory effort normal. Cardiovascular system: S1 & S2 heard, RRR. No JVD, murmurs, rubs, gallops or clicks. trace pedal edema. Gastrointestinal system: Abdomen is nondistended, soft and nontender. No organomegaly or masses felt. Normal bowel sounds heard. Central nervous system: Alert . Motor strength 5/5, vision improving  Extremities: Symmetric 5 x 5 power. Skin: No rashes, lesions or ulcers    Data Reviewed: I have personally reviewed following labs and imaging studies  CBC:  Recent Labs Lab 06/28/15 0405 06/29/15 0311 06/30/15 0001 06/30/15 0108 06/30/15 1023 07/01/15 0327 07/02/15 0511  WBC 19.3* 17.4*  --  16.3*  --  18.1* 17.4*  NEUTROABS 15.7* 13.4*  --  12.2*  --  12.5* 14.3*  HGB 7.2* 7.0* 6.8* 6.8* 9.5* 7.8* 8.6*  HCT 22.1* 21.7* 21.0* 21.1* 28.7* 23.8* 26.3*  MCV 94.8 96.4  --  93.8  --  94.1 93.9  PLT 557* 434*  --  413*  --  454* 245*   Basic Metabolic Panel:  Recent Labs Lab 06/28/15 0405 06/29/15 0311 06/30/15 0108 07/01/15 0327 07/02/15 0511  NA 134* 137 134* 133* 132*  K 3.3* 3.3* 3.5 3.6 4.6  CL 103 109 103 103 102  CO2 20* 18* 19* 16* 15*  GLUCOSE 107* 123* 146* 100*  154*  BUN 65* 69* 79* 91* 101*  CREATININE 3.84* 3.53* 4.05* 4.74* 6.34*  CALCIUM 8.5* 8.3* 8.4* 8.2* 7.7*   GFR: Estimated Creatinine Clearance: 12.7 mL/min (by C-G formula based on Cr of 6.34). Liver Function Tests:  Recent Labs Lab 06/27/15 0328 06/28/15 0405  AST 10* 14*  ALT 11* 14*  ALKPHOS 111 131*  BILITOT 0.7 0.9  PROT 7.0 6.5  ALBUMIN 1.6* 1.6*   No results for input(s): LIPASE, AMYLASE in the last 168 hours. No results for input(s): AMMONIA in the last 168 hours. Coagulation Profile: No results for input(s): INR, PROTIME in the last 168 hours. Cardiac Enzymes: No results for input(s): CKTOTAL, CKMB, CKMBINDEX, TROPONINI in the last 168 hours. BNP (last 3 results) No results for input(s): PROBNP in the last 8760 hours. HbA1C: No results for input(s): HGBA1C in the last 72 hours. CBG: No results for input(s): GLUCAP in the last 168 hours.  Lipid Profile: No results for input(s): CHOL, HDL, LDLCALC, TRIG, CHOLHDL, LDLDIRECT in the last 72 hours. Thyroid Function Tests: No results for input(s): TSH, T4TOTAL, FREET4, T3FREE, THYROIDAB in the last 72 hours. Anemia Panel: No results for input(s): VITAMINB12, FOLATE, FERRITIN, TIBC, IRON, RETICCTPCT in the last 72 hours. Sepsis Labs: No results for input(s): PROCALCITON, LATICACIDVEN in the last 168 hours.  Recent Results (from the past 240 hour(s))  Urine culture     Status: Abnormal   Collection Time: 06/23/15  2:30 PM  Result Value Ref Range Status   Specimen Description URINE, CLEAN CATCH  Final   Special Requests NONE  Final   Culture MULTIPLE SPECIES PRESENT, SUGGEST RECOLLECTION (A)  Final   Report Status 06/25/2015 FINAL  Final  Blood Culture (routine x 2)     Status: None   Collection Time: 06/23/15  2:45 PM  Result Value Ref Range Status   Specimen Description BLOOD RIGHT ANTECUBITAL  Final   Special Requests BOTTLES DRAWN AEROBIC AND ANAEROBIC 5 CC EA  Final   Culture   Final    NO GROWTH 5  DAYS Performed at [facility]    Report Status 06/28/2015 FINAL  Final  Blood Culture (routine x 2)     Status: None   Collection Time: 06/23/15  3:29 PM  Result Value Ref Range Status   Specimen Description BLOOD RIGHT HAND  Final   Special Requests BOTTLES DRAWN AEROBIC ONLY 9ML  Final   Culture   Final    NO GROWTH 5 DAYS Performed at [facility]    Report Status 06/28/2015 FINAL  Final  Culture, blood (routine x 2) Call MD if unable to obtain prior to antibiotics being given     Status: None   Collection Time: 06/25/15  5:05 PM  Result Value Ref Range Status   Specimen Description BLOOD RIGHT ARM  Final   Special Requests BOTTLES DRAWN AEROBIC AND ANAEROBIC 8 CC EA  Final   Culture   Final    NO GROWTH 5 DAYS Performed at [facility]    Report Status 06/30/2015 FINAL  Final  Culture, blood (routine x 2) Call MD if unable to obtain prior to antibiotics being given     Status: None   Collection Time: 06/25/15  5:08 PM  Result Value Ref Range Status   Specimen Description BLOOD RIGHT HAND  Final   Special Requests BOTTLES DRAWN AEROBIC AND ANAEROBIC 10 CC EA  Final   Culture   Final    NO GROWTH 5 DAYS Performed at [facility]    Report Status 06/30/2015 FINAL  Final  Urine culture     Status: None   Collection Time: 06/25/15  9:17 PM  Result Value Ref Range Status   Specimen Description URINE, CLEAN CATCH  Final   Special Requests NONE  Final   Culture NO GROWTH Performed at [facility]   Final   Report Status 06/27/2015 FINAL  Final  MRSA PCR Screening     Status: None   Collection Time: 06/26/15  5:00 PM  Result Value Ref Range Status   MRSA by PCR NEGATIVE NEGATIVE Final    Comment:        The GeneXpert MRSA Assay (FDA approved for NASAL specimens only), is one component of a comprehensive MRSA colonization surveillance program. It is not intended to diagnose MRSA infection nor to guide or monitor treatment for MRSA  infections.  Radiology Studies: Mr Thoracic Spine Wo Contrast  07/02/2015  CLINICAL DATA:  Multiple myeloma.  Back pain.  Fever EXAM: MRI THORACIC AND LUMBAR SPINE WITHOUT CONTRAST TECHNIQUE: Multiplanar and multiecho pulse sequences of the thoracic and lumbar spine were obtained without intravenous contrast. COMPARISON:  CT chest abdomen and pelvis 06/30/2015 FINDINGS: MRI THORACIC SPINE FINDINGS Alignment:  Normal Vertebrae: Diffuse infiltration of the bone marrow throughout all vertebral bodies consistent with myeloma. This also involves the posterior elements and ribs bilaterally. There is extradural tumor in the spinal canal posteriorly at T4, T7, and T 10-11. Extraosseous mass lesions in the ribs bilaterally consistent with myeloma. Cord: Posterior extradural mass at the T4 level causing mild spinal stenosis. Posterior extradural mass at the T7 level causing moderate spinal stenosis and mild compression of the cord. Cord has normal signal. Mild posterior extradural tumor at T10-11 extending to the right. This is causing right foraminal stenosis. No significant spinal stenosis. Paraspinal and other soft tissues: Paraspinous muscles normal. Small bilateral pleural effusions. Diffuse rib infiltration by mall myeloma with multiple extradural rib lesions. Disc levels: Negative for disc protrusion. No significant disc degeneration. MRI LUMBAR SPINE FINDINGS Segmentation:  Normal.  Lowest disc space L5-S1 Alignment:  Normal Vertebrae: Diffuse infiltration of the bone marrow by myeloma. This involves all vertebral bodies and extends into the posterior elements. There is involvement of the pelvis. Sacrum is diffusely involved. Negative for fracture. Conus medullaris: Extends to the L1 level and appears normal. Paraspinal and other soft tissues: No retroperitoneal adenopathy. Extraosseous mass from the left iliac wing displacing iliopsoas anteriorly Disc levels: L1-2:  Negative L2-3:  Negative L3-4:   Bilateral facet degeneration.  Normal disc space L4-5:  Bilateral facet degeneration.  Normal disc space L5-S1: Small central disc protrusion without neural impingement. IMPRESSION: MR THORACIC SPINE IMPRESSION Extensive and diffuse infiltration of the bone marrow with myeloma. No fracture. Posterior extradural mass the T4 level causing mild spinal stenosis. Posterior epidural mass at the T7 level causing moderate spinal stenosis and mild compression of the cord. MR LUMBAR SPINE IMPRESSION Diffuse bone marrow infiltration throughout the lumbar spine, sacrum, and pelvis. No fracture. No tumor is seen in the lumbar spinal canal. No evidence of infection in the thoracic or lumbar spine. Electronically Signed   By: Franchot Gallo M.D.   On: 07/02/2015 09:00   Mr Lumbar Spine Wo Contrast  07/02/2015  CLINICAL DATA:  Multiple myeloma.  Back pain.  Fever EXAM: MRI THORACIC AND LUMBAR SPINE WITHOUT CONTRAST TECHNIQUE: Multiplanar and multiecho pulse sequences of the thoracic and lumbar spine were obtained without intravenous contrast. COMPARISON:  CT chest abdomen and pelvis 06/30/2015 FINDINGS: MRI THORACIC SPINE FINDINGS Alignment:  Normal Vertebrae: Diffuse infiltration of the bone marrow throughout all vertebral bodies consistent with myeloma. This also involves the posterior elements and ribs bilaterally. There is extradural tumor in the spinal canal posteriorly at T4, T7, and T 10-11. Extraosseous mass lesions in the ribs bilaterally consistent with myeloma. Cord: Posterior extradural mass at the T4 level causing mild spinal stenosis. Posterior extradural mass at the T7 level causing moderate spinal stenosis and mild compression of the cord. Cord has normal signal. Mild posterior extradural tumor at T10-11 extending to the right. This is causing right foraminal stenosis. No significant spinal stenosis. Paraspinal and other soft tissues: Paraspinous muscles normal. Small bilateral pleural effusions. Diffuse rib  infiltration by mall myeloma with multiple extradural rib lesions. Disc levels: Negative for disc protrusion. No significant disc degeneration. MRI LUMBAR SPINE FINDINGS  Segmentation:  Normal.  Lowest disc space L5-S1 Alignment:  Normal Vertebrae: Diffuse infiltration of the bone marrow by myeloma. This involves all vertebral bodies and extends into the posterior elements. There is involvement of the pelvis. Sacrum is diffusely involved. Negative for fracture. Conus medullaris: Extends to the L1 level and appears normal. Paraspinal and other soft tissues: No retroperitoneal adenopathy. Extraosseous mass from the left iliac wing displacing iliopsoas anteriorly Disc levels: L1-2:  Negative L2-3:  Negative L3-4:  Bilateral facet degeneration.  Normal disc space L4-5:  Bilateral facet degeneration.  Normal disc space L5-S1: Small central disc protrusion without neural impingement. IMPRESSION: MR THORACIC SPINE IMPRESSION Extensive and diffuse infiltration of the bone marrow with myeloma. No fracture. Posterior extradural mass the T4 level causing mild spinal stenosis. Posterior epidural mass at the T7 level causing moderate spinal stenosis and mild compression of the cord. MR LUMBAR SPINE IMPRESSION Diffuse bone marrow infiltration throughout the lumbar spine, sacrum, and pelvis. No fracture. No tumor is seen in the lumbar spinal canal. No evidence of infection in the thoracic or lumbar spine. Electronically Signed   By: Franchot Gallo M.D.   On: 07/02/2015 09:00        Scheduled Meds: . acyclovir  400 mg Oral Daily  . enoxaparin (LOVENOX) injection  30 mg Subcutaneous Q24H  . feeding supplement (ENSURE ENLIVE)  237 mL Oral TID BM  . furosemide  80 mg Intravenous Once  . hydrALAZINE  25 mg Oral Q8H  . lenalidomide  10 mg Oral Q48H  . metoprolol tartrate  50 mg Oral BID  . mirtazapine  15 mg Oral QHS  . morphine  30 mg Oral Q12H  . polyethylene glycol  17 g Oral Daily  . senna-docusate  2 tablet Oral  BID   Continuous Infusions:     LOS: 9 days    Time spent: 35 minutes.     Florencia Reasons, MD PhD [facility] Pager [phone]  If 7PM-7AM, please contact night-coverage [email or web address] Password [email or web address] 07/02/2015, 4:55 PM

## 2015-07-02 NOTE — Progress Notes (Signed)
Report called to RN on 6E at [facility]. Carelink arranged. Pt and family updated on plan of care. Will continue to monitor.  [name] 07/02/2015 6:59 PM

## 2015-07-02 NOTE — Progress Notes (Signed)
[facility] Therapy Treatment Record Phone [phone]   Radiation Therapy was administered to Charles [name] on: 07/02/2015  5:42 PM and was treatment # 1 out of a planned course of 10 treatments.  Radiation Treatment  1). Beam photons with 6-10 energy and Photons 10-19 MeV  2). Brachytherapy None  3). Stereotactic Radiosurgery None  4). Other Radiation None     Charles [name], [place], Rad Therap

## 2015-07-02 NOTE — Progress Notes (Signed)
Manual calculation of BSA and dosing for carfilzomib completed with secondary verification by Drue Dun, RN

## 2015-07-02 NOTE — Progress Notes (Signed)
ADDENDUM:  I was just called by Dr. Carlis Abbott from radiology regarding [facility]'s MRI, which does not show any evidence of infection, but does show evidence of cord compression particularly in the mid thoracic spine area.  I have alerted radiation oncology so they can address this issue. I will discuss the overall situation with the patient later today.

## 2015-07-03 ENCOUNTER — Ambulatory Visit: Payer: Self-pay

## 2015-07-03 ENCOUNTER — Inpatient Hospital Stay (HOSPITAL_COMMUNITY): Payer: Medicaid Other

## 2015-07-03 LAB — CBC WITH DIFFERENTIAL/PLATELET
BASOS ABS: 0 10*3/uL (ref 0.0–0.1)
BASOS PCT: 0 %
EOS ABS: 0 10*3/uL (ref 0.0–0.7)
Eosinophils Relative: 0 %
HEMATOCRIT: 24.3 % — AB (ref 39.0–52.0)
HEMOGLOBIN: 7.7 g/dL — AB (ref 13.0–17.0)
Lymphocytes Relative: 8 %
Lymphs Abs: 1.3 10*3/uL (ref 0.7–4.0)
MCH: 30.2 pg (ref 26.0–34.0)
MCHC: 31.7 g/dL (ref 30.0–36.0)
MCV: 95.3 fL (ref 78.0–100.0)
MONOS PCT: 9 %
Monocytes Absolute: 1.4 10*3/uL — ABNORMAL HIGH (ref 0.1–1.0)
NEUTROS ABS: 12.4 10*3/uL — AB (ref 1.7–7.7)
NEUTROS PCT: 83 %
Platelets: 432 10*3/uL — ABNORMAL HIGH (ref 150–400)
RBC: 2.55 MIL/uL — AB (ref 4.22–5.81)
RDW: 15.1 % (ref 11.5–15.5)
WBC: 15 10*3/uL — AB (ref 4.0–10.5)

## 2015-07-03 LAB — BASIC METABOLIC PANEL
ANION GAP: 16 — AB (ref 5–15)
BUN: 134 mg/dL — ABNORMAL HIGH (ref 6–20)
CHLORIDE: 105 mmol/L (ref 101–111)
CO2: 14 mmol/L — AB (ref 22–32)
CREATININE: 7.56 mg/dL — AB (ref 0.61–1.24)
Calcium: 7.7 mg/dL — ABNORMAL LOW (ref 8.9–10.3)
GFR calc non Af Amer: 8 mL/min — ABNORMAL LOW (ref >60)
GFR, EST AFRICAN AMERICAN: 9 mL/min — AB (ref >60)
Glucose, Bld: 93 mg/dL (ref 65–99)
POTASSIUM: 5.2 mmol/L — AB (ref 3.5–5.1)
SODIUM: 135 mmol/L (ref 135–145)

## 2015-07-03 LAB — PROTIME-INR
INR: 1.34 (ref 0.00–1.49)
PROTHROMBIN TIME: 16.7 s — AB (ref 11.6–15.2)

## 2015-07-03 MED ORDER — MIDAZOLAM HCL 2 MG/2ML IJ SOLN
INTRAMUSCULAR | Status: AC | PRN
  Administered 2015-07-03: 1 mg via INTRAVENOUS
  Administered 2015-07-03: 0.5 mg via INTRAVENOUS

## 2015-07-03 MED ORDER — FENTANYL CITRATE (PF) 100 MCG/2ML IJ SOLN
INTRAMUSCULAR | Status: AC | PRN
  Administered 2015-07-03: 25 ug via INTRAVENOUS

## 2015-07-03 MED ORDER — ACYCLOVIR 400 MG PO TABS
400.0000 mg | ORAL_TABLET | Freq: Every day | ORAL | Status: DC
  Administered 2015-07-04 – 2015-07-09 (×7): 400 mg via ORAL
  Filled 2015-07-03 (×8): qty 1

## 2015-07-03 MED ORDER — CEFAZOLIN SODIUM-DEXTROSE 2-4 GM/100ML-% IV SOLN
2.0000 g | INTRAVENOUS | Status: AC
  Filled 2015-07-03: qty 100

## 2015-07-03 MED ORDER — LIDOCAINE HCL 1 % IJ SOLN
INTRAMUSCULAR | Status: AC
  Filled 2015-07-03: qty 20

## 2015-07-03 MED ORDER — CEFAZOLIN SODIUM-DEXTROSE 2-3 GM-% IV SOLR
INTRAVENOUS | Status: AC
  Administered 2015-07-03: 2000 mg
  Filled 2015-07-03: qty 50

## 2015-07-03 MED ORDER — HEPARIN SODIUM (PORCINE) 1000 UNIT/ML IJ SOLN
INTRAMUSCULAR | Status: AC
  Filled 2015-07-03: qty 1

## 2015-07-03 MED ORDER — FENTANYL CITRATE (PF) 100 MCG/2ML IJ SOLN
INTRAMUSCULAR | Status: AC
  Filled 2015-07-03: qty 2

## 2015-07-03 MED ORDER — MIDAZOLAM HCL 2 MG/2ML IJ SOLN
INTRAMUSCULAR | Status: AC
  Filled 2015-07-03: qty 2

## 2015-07-03 MED ORDER — SODIUM CHLORIDE 0.9 % IV SOLN
INTRAVENOUS | Status: AC | PRN
  Administered 2015-07-03: 10 mL/h via INTRAVENOUS

## 2015-07-03 NOTE — Procedures (Signed)
Interventional Radiology Procedure Note  Procedure: Placement of a right IJ approach HD catheter, tunneled, 19cm tip to cuff.  Tip is positioned at the superior cavoatrial junction and catheter is ready for immediate use.  Complications: None Recommendations:  - Ok to shower tomorrow - Do not submerge  - Routine line care  - Ok to use  Signed,  Dulcy Fanny. Earleen Newport, DO

## 2015-07-03 NOTE — Progress Notes (Addendum)
[name] Progress Note   Subjective: alert, tired, good appetite, poor UOP and rising B/cr, Bun > 100 now  Filed Vitals:   07/02/15 1403 07/02/15 2010 07/03/15 0400 07/03/15 0621  BP: 113/71 129/72 111/64 114/66  Pulse: 72 80 77 64  Temp: 97.5 F (36.4 C) 97.3 F (36.3 C) 97.4 F (36.3 C)   TempSrc: Oral Oral Oral   Resp: '16 18 16   '$ Height:  '5\' 1"'$  (1.549 m)    Weight:  73.1 kg (161 lb 2.5 oz)    SpO2: 100% 99% 98%     Inpatient medications: . acyclovir  400 mg Oral QHS  . enoxaparin (LOVENOX) injection  30 mg Subcutaneous Q24H  . feeding supplement (ENSURE ENLIVE)  237 mL Oral TID BM  . hydrALAZINE  25 mg Oral Q8H  . lenalidomide  10 mg Oral Q48H  . metoprolol tartrate  50 mg Oral BID  . mirtazapine  15 mg Oral QHS  . morphine  30 mg Oral Q12H  . polyethylene glycol  17 g Oral Daily  . senna-docusate  2 tablet Oral BID     sodium chloride, acetaminophen, albuterol, albuterol, alteplase, alteplase, diatrizoate meglumine-sodium, diphenhydrAMINE, diphenhydrAMINE, diphenhydrAMINE, diphenhydrAMINE, EPINEPHrine, EPINEPHrine, EPINEPHrine, EPINEPHrine, EPINEPHrine, EPINEPHrine, EPINEPHrine, EPINEPHrine, famotidine, heparin lock flush, heparin lock flush, heparin lock flush, heparin lock flush, heparin lock flush, heparin lock flush, HYDROcodone-acetaminophen, methylPREDNISolone sodium succinate, methylPREDNISolone sodium succinate, prochlorperazine, sodium chloride flush, sodium chloride flush, sodium chloride flush, sodium chloride flush  Exam: Alert, Ox 3 Throat clear  No jve Chest clear bilat RRR soft SEM, no RG Abd soft ntnd no mass or ascites +bs GU normal male Ext no LE edema Neuro is alert, Ox 3 , nf, no asterixis  UA by undersigned > urine dark brownish-red, micro w sheets of RBC's (nondysmorphic), 20-30 WBC/ hpf, moderate gran casts, some degen cellular casts, minimal bact Renal US 5/24 - 12 cm kidneys, no hydro, ^'d echo mild CXR - no acute  disease (5/25), new mass like shadows  Assessment: 1  Renal failure - progressive renal failure over last 5 mos in pt w known myeloma since 12/15.  Oliguric now w rapidly rising B/Cr. 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  Will need to start dialysis. 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  Have d/w pt and family.  Transfer [facility] and asked IR to place [facility] on Friday,. Start dialysis.  2  Vol- is edematous, no need for more IVF, have dc'd 3  Mult myeloma - got carfilzomab this week 4  PRES syndrome - resolving clinically 5  HTN urgency - resolved  Plan - transfer [facility], HD as above tomorrow   [name] MD [facility] pager [phone]    cell [phone] 07/02/2015, 6:03 PM   Recent Labs Lab 06/30/15 0108 07/01/15 0327 07/02/15 0511  NA 134* 133* 132*  K 3.5 3.6 4.6  CL 103 103 102  CO2 19* 16* 15*  GLUCOSE 146* 100* 154*  BUN 79* 91* 101*  CREATININE 4.05* 4.74* 6.34*  CALCIUM 8.4* 8.2* 7.7*    Recent Labs Lab 06/27/15 0328 06/28/15 0405  AST 10* 14*  ALT 11* 14*  ALKPHOS 111 131*  BILITOT 0.7 0.9  PROT 7.0 6.5  ALBUMIN 1.6* 1.6*    Recent Labs Lab 07/01/15 0327 07/02/15 0511 07/03/15 0859  WBC 18.1* 17.4* 15.0*  NEUTROABS 12.5* 14.3* 12.4*  HGB 7.8* 8.6* 7.7*  HCT 23.8* 26.3* 24.3*  MCV 94.1 93.9 95.3  PLT 454* 507* 432*

## 2015-07-03 NOTE — Progress Notes (Deleted)
[name] Progress Note   Subjective: alert, tired, good appetite, poor UOP and rising B/cr, Bun > 100 now  Filed Vitals:   07/02/15 1403 07/02/15 2010 07/03/15 0400 07/03/15 0621  BP: 113/71 129/72 111/64 114/66  Pulse: 72 80 77 64  Temp: 97.5 F (36.4 C) 97.3 F (36.3 C) 97.4 F (36.3 C)   TempSrc: Oral Oral Oral   Resp: '16 18 16   '$ Height:  '5\' 1"'$  (1.549 m)    Weight:  73.1 kg (161 lb 2.5 oz)    SpO2: 100% 99% 98%     Inpatient medications: . acyclovir  400 mg Oral Daily  . enoxaparin (LOVENOX) injection  30 mg Subcutaneous Q24H  . feeding supplement (ENSURE ENLIVE)  237 mL Oral TID BM  . hydrALAZINE  25 mg Oral Q8H  . lenalidomide  10 mg Oral Q48H  . metoprolol tartrate  50 mg Oral BID  . mirtazapine  15 mg Oral QHS  . morphine  30 mg Oral Q12H  . polyethylene glycol  17 g Oral Daily  . senna-docusate  2 tablet Oral BID     sodium chloride, acetaminophen, albuterol, albuterol, alteplase, alteplase, diatrizoate meglumine-sodium, diphenhydrAMINE, diphenhydrAMINE, diphenhydrAMINE, diphenhydrAMINE, EPINEPHrine, EPINEPHrine, EPINEPHrine, EPINEPHrine, EPINEPHrine, EPINEPHrine, EPINEPHrine, EPINEPHrine, famotidine, heparin lock flush, heparin lock flush, heparin lock flush, heparin lock flush, heparin lock flush, heparin lock flush, HYDROcodone-acetaminophen, methylPREDNISolone sodium succinate, methylPREDNISolone sodium succinate, prochlorperazine, sodium chloride flush, sodium chloride flush, sodium chloride flush, sodium chloride flush  Exam: Alert, Ox 3 Throat clear  No jve Chest clear bilat RRR soft SEM, no RG Abd soft ntnd no mass or ascites +bs GU normal male Ext no LE edema Neuro is alert, Ox 3 , nf, no asterixis  UA by undersigned > urine dark brownish-red, micro w sheets of RBC's (nondysmorphic), 20-30 WBC/ hpf, moderate gran casts, some degen cellular casts, minimal bact Renal US 5/24 - 12 cm kidneys, no hydro, ^'d echo mild CXR - no acute  disease (5/25), new mass like shadows  Assessment: 1  Renal failure - progressive renal failure over last 5 mos in pt w known myeloma since 12/15.  Oliguric now w rapidly rising B/Cr. 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  Will need to start dialysis. 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  Have d/w pt and family.  Transfer to [facility] and asked IR to place [facility] on Friday,. Start dialysis.  2  Vol- is edematous, no need for more IVF, have dc'd 3  Mult myeloma - got carfilzomab this week 4  PRES syndrome - resolving clinically 5  HTN urgency - resolved  Plan - transfer [facility], HD as above tomorrow   [name] MD [facility] pager [phone]    cell [phone] 07/02/2015, 6:03 PM   Recent Labs Lab 06/30/15 0108 07/01/15 0327 07/02/15 0511  NA 134* 133* 132*  K 3.5 3.6 4.6  CL 103 103 102  CO2 19* 16* 15*  GLUCOSE 146* 100* 154*  BUN 79* 91* 101*  CREATININE 4.05* 4.74* 6.34*  CALCIUM 8.4* 8.2* 7.7*    Recent Labs Lab 06/27/15 0328 06/28/15 0405  AST 10* 14*  ALT 11* 14*  ALKPHOS 111 131*  BILITOT 0.7 0.9  PROT 7.0 6.5  ALBUMIN 1.6* 1.6*    Recent Labs Lab 06/30/15 0108 06/30/15 1023 07/01/15 0327 07/02/15 0511  WBC 16.3*  --  18.1* 17.4*  NEUTROABS 12.2*  --  12.5* 14.3*  HGB 6.8* 9.5* 7.8* 8.6*  HCT 21.1* 28.7* 23.8* 26.3*  MCV 93.8  --  94.1 93.9  PLT 413*  --  454* 507*

## 2015-07-03 NOTE — Care Management Note (Signed)
Case Management Note  Patient Details  Name: [name] MRN: [id number] Date of Birth: Mar 17, 1975  Subjective/Objective:     CM following for progression and d/c planning.               Action/Plan: 07/03/2015 Spoke with Bryson Dames , of the hemodialysis center re potential Medicaid application for this pt. This pt has no SS#. This CM contacted CSW at the [facility] at [facility] who noted in the pt record that the financial councilor at that facility has stated that the pt is not eligible for Medicaid as he has no SS#. Pt was given resources re hospital assistance. Bethena Roys will Programmer, systems at [facility] and begin process of applying for Medicaid to cover cost of outpatient hemodialysis .   Expected Discharge Date:   (unknown)               Expected Discharge Plan:  Home/Self Care  In-House Referral:     Discharge planning Services  CM Consult  Post Acute Care Choice:    Choice offered to:     DME Arranged:    DME Agency:     HH Arranged:    HH Agency:     Status of Service:  In process, will continue to follow  Medicare Important Message Given:    Date Medicare IM Given:    Medicare IM give by:    Date Additional Medicare IM Given:    Additional Medicare Important Message give by:     If discussed at [place] of Stay Meetings, dates discussed:    Additional Comments:  Adron Bene, RN 07/03/2015, 12:08 PM

## 2015-07-03 NOTE — Consult Note (Signed)
Chief Complaint: Patient was seen in consultation today for tunneled hemodialysis catheter placement Chief Complaint  Patient presents with  . Emesis   at the request of Dr Roney Jaffe  Referring Physician(s): Dr Roney Jaffe   Supervising Physician: Corrie Mckusick  Patient Status: In-pt   History of Present Illness: Charles [name] is a 40 y.o. male   Worsening renal failure x 5-6 months per Renal MD Known MM New chemo drug recently---+N/V/D PRES syndrome Cr 6.34 Request for tunneled HD catheter placement today   Past Medical History  Diagnosis Date  . IgG myeloma ([place])   . CKD (chronic kidney disease), stage III   . Hypercholesterolemia   . Lytic bone lesions on xray     History reviewed. No pertinent past surgical history.  Allergies: Ciprofloxacin and Mobic  Medications: Prior to Admission medications   Medication Sig Start Date End Date Taking? Authorizing Provider  acetaminophen (TYLENOL) 500 MG tablet Take 500 mg by mouth every 6 (six) hours as needed for mild pain, moderate pain, fever or headache.   Yes Historical Provider, MD  acyclovir (ZOVIRAX) 200 MG capsule Take 2 capsules (400 mg total) by mouth daily. Patient taking differently: Take 400 mg by mouth daily. Scheduled everyday 06/11/15  Yes [name], MD  Cholecalciferol (VITAMIN D PO) Take 500 Units by mouth daily.   Yes Historical Provider, MD  dexamethasone (DECADRON) 4 MG tablet Take 5 tablets (68m) twice a week, not on treatment day (tome 5 pastillas dos veces a la semana, no en el dia de tratamiento) 06/11/15  Yes GChauncey Cruel MD  lenalidomide (REVLIMID) 10 MG capsule Take 10 mg by mouth daily.   Yes Historical Provider, MD  prochlorperazine (COMPAZINE) 10 MG tablet Take 1 tablet (10 mg total) by mouth every 6 (six) hours as needed for nausea or vomiting. 06/11/15  Yes GChauncey Cruel MD  traMADol (ULTRAM) 50 MG tablet Take 1 tablet (50 mg total) by mouth every 6 (six)  hours as needed. Patient taking differently: Take 50 mg by mouth every 6 (six) hours as needed for moderate pain.  06/11/15  Yes GChauncey Cruel MD     Family History  Problem Relation Age of Onset  . Diabetes Father     Social History   Social History  . Marital Status: Married    Spouse Name: N/A  . Number of Children: 5  . Years of Education: N/A   Occupational History  . Roofer    Social History Main Topics  . Smoking status: Never Smoker   . Smokeless tobacco: Never Used  . Alcohol Use: No  . Drug Use: No  . Sexual Activity:    Partners: Female   Other Topics Concern  . None   Social History Narrative     Review of Systems: A 12 point ROS discussed and pertinent positives are indicated in the HPI above.  All other systems are negative.  Review of Systems  Constitutional: Positive for activity change, appetite change and fatigue. Negative for fever.  Respiratory: Negative for cough and shortness of breath.   Gastrointestinal: Positive for nausea, vomiting and diarrhea. Negative for abdominal pain.  Neurological: Positive for weakness.  Psychiatric/Behavioral: Negative for behavioral problems and confusion.    Vital Signs: BP 114/66 mmHg  Pulse 64  Temp(Src) 97.4 F (36.3 C) (Oral)  Resp 16  Ht 5' 1"  (1.549 m)  Wt 161 lb 2.5 oz (73.1 kg)  BMI 30.47 kg/m2  SpO2 98%  Physical Exam  Constitutional: He is oriented to person, place, and time.  Cardiovascular: Normal rate and regular rhythm.   Pulmonary/Chest: Effort normal and breath sounds normal.  Abdominal: Soft. Bowel sounds are normal. There is no tenderness.  Musculoskeletal: Normal range of motion.  Neurological: He is alert and oriented to person, place, and time.  Skin: Skin is warm and dry.  Psychiatric: He has a normal mood and affect. His behavior is normal. Judgment and thought content normal.  Pt speaks some English Seemed to understand me Wife in room and speaks fluent [place] Both  signed consent  Nursing note and vitals reviewed.   Mallampati Score:  MD Evaluation Airway: WNL Heart: WNL Abdomen: WNL Chest/ Lungs: WNL ASA  Classification: 3, 2 Mallampati/Airway Score: Two  Imaging: Ct Abdomen Pelvis Wo Contrast  06/30/2015  CLINICAL DATA:  History of multiple myeloma with chronic kidney disease stage 3 and back pain currently undergoing chemotherapy. Nausea, vomiting, diarrhea, fever and chills four days. EXAM: CT CHEST, ABDOMEN AND PELVIS WITHOUT CONTRAST TECHNIQUE: Multidetector CT imaging of the chest, abdomen and pelvis was performed following the standard protocol without IV contrast. COMPARISON:  CT 06/23/2015, 02/03/2014 and 01/30/2014 FINDINGS: CT CHEST Lungs are adequately inflated demonstrate a small amount of bilateral pleural fluid left greater than right with associated atelectasis in the posterior lung bases left greater than right. There are new multiple bilateral well-defined focal pleural masses right greater than left with the largest over the right apex measuring 2.8 x 5.2 cm. These likely represent spread of patient's known multiple myeloma. Airways are normal. Heart is normal size. No definite mediastinal or hilar adenopathy. Small amount contrast within the mid to distal esophagus likely due to dysmotility versus reflux. Remaining mediastinal structures are unremarkable. There are couple hypodense thyroid nodules with the largest measuring 1.2 cm over the left lobe. Patient's known destructive sternal manubrial lesion is unchanged. There is subtle increased lucency over the body of the sternum which is new likely spread of patient's known myeloma. There are several lucencies over the thoracic spine with the most prominent over the T9 vertebral body. CT ABDOMEN AND PELVIS The liver, spleen, gallbladder, pancreas and adrenal glands are within normal. Kidneys normal size without hydronephrosis or nephrolithiasis. Stable exophytic hypodensity over the mid pole  cortex of the left kidney likely a cyst. Ureters are normal. Stomach is within normal. Small bowel is normal. Appendix is normal. Subtle diverticulosis of the colon most prominent over the right colon. There is no significant free fluid or focal inflammatory change. Few small periaortic lymph nodes. Vascular structures are unremarkable. Pelvic images demonstrate the bladder, prostate and rectum to be within normal. Ill-defined low-attenuation over the right iliac bone likely a myelomatous lesion. Ill-defined lytic process over the right femoral neck spanning nearly the entire width of the femoral neck increasing risk of pathologic fracture. Bone island over the left sacrum. IMPRESSION: No acute findings in the chest, abdomen or pelvis. Lytic lesions within the sternum, spine, right iliac bone and right femoral neck compatible with patient's known myeloma. Note that patient is at risk for pathologic fracture through the right femoral neck. Multiple new bilateral focal pleural masses with the largest over the right apex measuring 2.8 x 5.2 cm likely metastatic disease from patient's multiple myeloma. Small amount of bilateral pleural fluid bibasilar consolidation most typical of atelectasis. Minimal diverticulosis of the colon. Stable 1.3 cm exophytic hypodensity over the mid pole left kidney likely a cyst. Couple small hypodense thyroid nodules with the  larger over the left lobe measuring 1.2 cm. Recommend follow-up as clinically indicated. Electronically Signed   By: Marin Olp M.D.   On: 06/30/2015 17:24   Dg Chest 2 View  06/25/2015  CLINICAL DATA:  Pt complains of Nausea, Vomiting, diarrhea, fever and chills x 4 days. Hx of multiple myeloma and on chemo. EXAM: CHEST  2 VIEW COMPARISON:  Multiple prior studies including 01/30/2014 and 19-Feb-2015 FINDINGS: Shallow lung inflation. The heart is mildly prominent in size. There are small bilateral pleural effusions. Minimal streaky density identified at the lung  bases, left greater than right. There is right apical extrapleural soft tissue density. Rounded soft tissue density overlies the right posterior lateral fifth rib and there is possible expansion of the right anterior fifth rib. There is a remote fracture of the right second anterior rib. IMPRESSION: 1. Interval change in the appearance of the chest. 2. Increased soft tissue masses and possible expansion of the right anterior fifth rib. 3. 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 Consider further evaluation with CT of the chest as needed. 4. Bilateral lower lobe atelectasis and/or infiltrates, left greater than right. Electronically Signed   By: Nolon Nations M.D.   On: 06/25/2015 15:58   Ct Chest Wo Contrast  06/30/2015  CLINICAL DATA:  History of multiple myeloma with chronic kidney disease stage 3 and back pain currently undergoing chemotherapy. Nausea, vomiting, diarrhea, fever and chills four days. EXAM: CT CHEST, ABDOMEN AND PELVIS WITHOUT CONTRAST TECHNIQUE: Multidetector CT imaging of the chest, abdomen and pelvis was performed following the standard protocol without IV contrast. COMPARISON:  CT 06/23/2015, 02/03/2014 and 01/30/2014 FINDINGS: CT CHEST Lungs are adequately inflated demonstrate a small amount of bilateral pleural fluid left greater than right with associated atelectasis in the posterior lung bases left greater than right. There are new multiple bilateral well-defined focal pleural masses right greater than left with the largest over the right apex measuring 2.8 x 5.2 cm. These likely represent spread of patient's known multiple myeloma. Airways are normal. Heart is normal size. No definite mediastinal or hilar adenopathy. Small amount contrast within the mid to distal esophagus likely due to dysmotility versus reflux. Remaining mediastinal structures are unremarkable. There are couple hypodense thyroid nodules with the largest measuring 1.2 cm over the left lobe. Patient's known destructive  sternal manubrial lesion is unchanged. There is subtle increased lucency over the body of the sternum which is new likely spread of patient's known myeloma. There are several lucencies over the thoracic spine with the most prominent over the T9 vertebral body. CT ABDOMEN AND PELVIS The liver, spleen, gallbladder, pancreas and adrenal glands are within normal. Kidneys normal size without hydronephrosis or nephrolithiasis. Stable exophytic hypodensity over the mid pole cortex of the left kidney likely a cyst. Ureters are normal. Stomach is within normal. Small bowel is normal. Appendix is normal. Subtle diverticulosis of the colon most prominent over the right colon. There is no significant free fluid or focal inflammatory change. Few small periaortic lymph nodes. Vascular structures are unremarkable. Pelvic images demonstrate the bladder, prostate and rectum to be within normal. Ill-defined low-attenuation over the right iliac bone likely a myelomatous lesion. Ill-defined lytic process over the right femoral neck spanning nearly the entire width of the femoral neck increasing risk of pathologic fracture. Bone island over the left sacrum. IMPRESSION: No acute findings in the chest, abdomen or pelvis. Lytic lesions within the sternum, spine, right iliac bone and right femoral neck compatible with patient's known myeloma. Note that patient  is at risk for pathologic fracture through the right femoral neck. Multiple new bilateral focal pleural masses with the largest over the right apex measuring 2.8 x 5.2 cm likely metastatic disease from patient's multiple myeloma. Small amount of bilateral pleural fluid bibasilar consolidation most typical of atelectasis. Minimal diverticulosis of the colon. Stable 1.3 cm exophytic hypodensity over the mid pole left kidney likely a cyst. Couple small hypodense thyroid nodules with the larger over the left lobe measuring 1.2 cm. Recommend follow-up as clinically indicated.  Electronically Signed   By: Marin Olp M.D.   On: 06/30/2015 17:24   Mr Brain Wo Contrast  06/26/2015  CLINICAL DATA:  Bilateral blurry vision. Fever. History of multiple myeloma on chemotherapy. Elevated blood pressures. EXAM: MRI HEAD WITHOUT CONTRAST TECHNIQUE: Multiplanar, multiecho pulse sequences of the brain and surrounding structures were obtained without intravenous contrast. COMPARISON:  Head CT 10/11/2010 FINDINGS: There is abnormal T2 hyperintensity involving cortex and subcortical white matter in both occipital lobes and left greater than right parietal lobes. No restricted diffusion or hemorrhage is seen. The brain is otherwise normal in signal aside from scattered, punctate foci of T2 hyperintensity in the cerebral white matter which are nonspecific. No mass, midline shift, or extra-axial fluid collection is seen. Ventricles are normal in size. Orbits are unremarkable. A small amount of fluid is present in the right maxillary sinus. The mastoid air cells are clear. Major intracranial vascular flow voids are preserved. There is a 1.4 cm left frontal skull lesion with extension through the inner and outer tables of the skull and small volume epidural tumor without significant mass effect on the underlying brain. No brain edema. Multiple other, smaller lesions are scattered elsewhere in the skull. Baseline bone marrow signal is diffusely diminished throughout the skull and visualized upper cervical spine and may be related to ongoing treatment. IMPRESSION: 1. 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 2. Multiple skull lesions consistent with known multiple myeloma. 1.4 cm left frontal lesion with mild epidural extension. Electronically Signed   By: Logan Bores M.D.   On: 06/26/2015 12:22   Mr Thoracic Spine Wo Contrast  07/02/2015  CLINICAL DATA:  Multiple myeloma.  Back pain.  Fever EXAM: MRI THORACIC AND LUMBAR SPINE WITHOUT CONTRAST TECHNIQUE:  Multiplanar and multiecho pulse sequences of the thoracic and lumbar spine were obtained without intravenous contrast. COMPARISON:  CT chest abdomen and pelvis 06/30/2015 FINDINGS: MRI THORACIC SPINE FINDINGS Alignment:  Normal Vertebrae: Diffuse infiltration of the bone marrow throughout all vertebral bodies consistent with myeloma. This also involves the posterior elements and ribs bilaterally. There is extradural tumor in the spinal canal posteriorly at T4, T7, and T 10-11. Extraosseous mass lesions in the ribs bilaterally consistent with myeloma. Cord: Posterior extradural mass at the T4 level causing mild spinal stenosis. Posterior extradural mass at the T7 level causing moderate spinal stenosis and mild compression of the cord. Cord has normal signal. Mild posterior extradural tumor at T10-11 extending to the right. This is causing right foraminal stenosis. No significant spinal stenosis. Paraspinal and other soft tissues: Paraspinous muscles normal. Small bilateral pleural effusions. Diffuse rib infiltration by mall myeloma with multiple extradural rib lesions. Disc levels: Negative for disc protrusion. No significant disc degeneration. MRI LUMBAR SPINE FINDINGS Segmentation:  Normal.  Lowest disc space L5-S1 Alignment:  Normal Vertebrae: Diffuse infiltration of the bone marrow by myeloma. This involves all vertebral bodies and extends into the posterior elements. There is involvement of the pelvis. Sacrum  is diffusely involved. Negative for fracture. Conus medullaris: Extends to the L1 level and appears normal. Paraspinal and other soft tissues: No retroperitoneal adenopathy. Extraosseous mass from the left iliac wing displacing iliopsoas anteriorly Disc levels: L1-2:  Negative L2-3:  Negative L3-4:  Bilateral facet degeneration.  Normal disc space L4-5:  Bilateral facet degeneration.  Normal disc space L5-S1: Small central disc protrusion without neural impingement. IMPRESSION: MR THORACIC SPINE IMPRESSION  Extensive and diffuse infiltration of the bone marrow with myeloma. No fracture. Posterior extradural mass the T4 level causing mild spinal stenosis. Posterior epidural mass at the T7 level causing moderate spinal stenosis and mild compression of the cord. MR LUMBAR SPINE IMPRESSION Diffuse bone marrow infiltration throughout the lumbar spine, sacrum, and pelvis. No fracture. No tumor is seen in the lumbar spinal canal. No evidence of infection in the thoracic or lumbar spine. Electronically Signed   By: Franchot Gallo M.D.   On: 07/02/2015 09:00   Mr Lumbar Spine Wo Contrast  07/02/2015  CLINICAL DATA:  Multiple myeloma.  Back pain.  Fever EXAM: MRI THORACIC AND LUMBAR SPINE WITHOUT CONTRAST TECHNIQUE: Multiplanar and multiecho pulse sequences of the thoracic and lumbar spine were obtained without intravenous contrast. COMPARISON:  CT chest abdomen and pelvis 06/30/2015 FINDINGS: MRI THORACIC SPINE FINDINGS Alignment:  Normal Vertebrae: Diffuse infiltration of the bone marrow throughout all vertebral bodies consistent with myeloma. This also involves the posterior elements and ribs bilaterally. There is extradural tumor in the spinal canal posteriorly at T4, T7, and T 10-11. Extraosseous mass lesions in the ribs bilaterally consistent with myeloma. Cord: Posterior extradural mass at the T4 level causing mild spinal stenosis. Posterior extradural mass at the T7 level causing moderate spinal stenosis and mild compression of the cord. Cord has normal signal. Mild posterior extradural tumor at T10-11 extending to the right. This is causing right foraminal stenosis. No significant spinal stenosis. Paraspinal and other soft tissues: Paraspinous muscles normal. Small bilateral pleural effusions. Diffuse rib infiltration by mall myeloma with multiple extradural rib lesions. Disc levels: Negative for disc protrusion. No significant disc degeneration. MRI LUMBAR SPINE FINDINGS Segmentation:  Normal.  Lowest disc space  L5-S1 Alignment:  Normal Vertebrae: Diffuse infiltration of the bone marrow by myeloma. This involves all vertebral bodies and extends into the posterior elements. There is involvement of the pelvis. Sacrum is diffusely involved. Negative for fracture. Conus medullaris: Extends to the L1 level and appears normal. Paraspinal and other soft tissues: No retroperitoneal adenopathy. Extraosseous mass from the left iliac wing displacing iliopsoas anteriorly Disc levels: L1-2:  Negative L2-3:  Negative L3-4:  Bilateral facet degeneration.  Normal disc space L4-5:  Bilateral facet degeneration.  Normal disc space L5-S1: Small central disc protrusion without neural impingement. IMPRESSION: MR THORACIC SPINE IMPRESSION Extensive and diffuse infiltration of the bone marrow with myeloma. No fracture. Posterior extradural mass the T4 level causing mild spinal stenosis. Posterior epidural mass at the T7 level causing moderate spinal stenosis and mild compression of the cord. MR LUMBAR SPINE IMPRESSION Diffuse bone marrow infiltration throughout the lumbar spine, sacrum, and pelvis. No fracture. No tumor is seen in the lumbar spinal canal. No evidence of infection in the thoracic or lumbar spine. Electronically Signed   By: Franchot Gallo M.D.   On: 07/02/2015 09:00   US Renal Port  06/24/2015  CLINICAL DATA:  Acute kidney injury. EXAM: RENAL / URINARY TRACT ULTRASOUND COMPLETE COMPARISON:  CT scan of Jun 23, 2015. FINDINGS: Right Kidney: Length: 12.1 cm. Mildly increased  echogenicity of renal parenchyma is noted. No mass or hydronephrosis visualized. Left Kidney: Length: 11.9 cm. Mildly increased echogenicity of renal parenchyma is noted. 1.6 cm simple exophytic cyst is seen arising from midpole. No mass or hydronephrosis visualized. Bladder: Appears normal for degree of bladder distention. IMPRESSION: Mildly increased echogenicity of renal parenchyma is noted bilaterally suggesting medical renal disease. No hydronephrosis or  renal obstruction is noted. Electronically Signed   By: Marijo Conception, M.D.   On: 06/24/2015 09:50   Ct Renal Stone Study  06/23/2015  CLINICAL DATA:  History multiple myeloma currently on chemotherapy. Chronic kidney disease. Nausea, vomiting and diarrhea 4 days. Generalized abdominal pain with chills and weakness as well as dysuria and dark urine. Mid back pain. EXAM: CT ABDOMEN AND PELVIS WITHOUT CONTRAST TECHNIQUE: Multidetector CT imaging of the abdomen and pelvis was performed following the standard protocol without IV contrast. COMPARISON:  12/28/2014 and 02/03/2014 FINDINGS: Lung bases demonstrate very small bilateral pleural effusions with associated dependent atelectasis. Minimal cardiomegaly. The Abdominal images demonstrate the liver, spleen pancreas, gallbladder and adrenal glands to be within normal. Vascular structures are within normal. Kidneys are normal in size without hydronephrosis or nephrolithiasis. 1.3 cm exophytic cyst over the mid pole of the left kidney. Ureters are normal. Stomach is normal. Appendix is normal. Small bowel is within normal. There is mild diverticulosis of the colon. Pelvic images demonstrate the bladder, prostate and rectum to be within normal. Stable 1 cm sclerotic focus over the left sacrum likely a bone island. Old right lower anterior rib fracture. Focal lucency along the left side of the T9 vertebral body with subtle patchy low density T7 and T8 not well seen previously and may represent evidence of patient's multiple myeloma. IMPRESSION: No acute findings in the abdomen/ pelvis. Subtle lucencies over the T7, T8 and T9 vertebral bodies not well seen previously and likely due to patient's known multiple myeloma. No compression fracture. Small bilateral pleural effusions with associated basilar atelectasis. Mild diverticulosis of the colon. 1.3 cm left renal cyst. Electronically Signed   By: Marin Olp M.D.   On: 06/23/2015 17:33    Labs:  CBC:  Recent  Labs  06/29/15 0311  06/30/15 0108 06/30/15 1023 07/01/15 0327 07/02/15 0511  WBC 17.4*  --  16.3*  --  18.1* 17.4*  HGB 7.0*  < > 6.8* 9.5* 7.8* 8.6*  HCT 21.7*  < > 21.1* 28.7* 23.8* 26.3*  PLT 434*  --  413*  --  454* 507*  < > = values in this interval not displayed.  COAGS:  Recent Labs  12/28/14 0209  INR 1.21    BMP:  Recent Labs  06/29/15 0311 06/30/15 0108 07/01/15 0327 07/02/15 0511  NA 137 134* 133* 132*  K 3.3* 3.5 3.6 4.6  CL 109 103 103 102  CO2 18* 19* 16* 15*  GLUCOSE 123* 146* 100* 154*  BUN 69* 79* 91* 101*  CALCIUM 8.3* 8.4* 8.2* 7.7*  CREATININE 3.53* 4.05* 4.74* 6.34*  GFRNONAA 20* 17* 14* 10*  GFRAA 23* 20* 16* 11*    LIVER FUNCTION TESTS:  Recent Labs  06/23/15 1450 06/24/15 0338 06/27/15 0328 06/28/15 0405  BILITOT 0.4 0.3 0.7 0.9  AST 12* 10* 10* 14*  ALT 23 18 11* 14*  ALKPHOS 152* 118 111 131*  PROT 7.9 6.7 7.0 6.5  ALBUMIN 1.9* 1.6* 1.6* 1.6*    TUMOR MARKERS: No results for input(s): AFPTM, CEA, CA199, CHROMGRNA in the last 8760 hours.  Assessment  and Plan:  Worsening renal function Need for dialysis per Renal MD Now scheduled for same Risks and Benefits discussed with the patient including, but not limited to bleeding, infection, vascular injury, pneumothorax which may require chest tube placement, air embolism or even death All of the patient's questions were answered, patient is agreeable to proceed. Consent signed and in chart.  Wbc hi-- on steroids  Thank you for this interesting consult.  I greatly enjoyed meeting Atticus Crossley and look forward to participating in their care.  A copy of this report was sent to the requesting provider on this date.  Electronically Signed: Joanthony Hamza A 07/03/2015, 8:36 AM   I spent a total of 20 minutes    in face to face in clinical consultation, greater than 50% of which was counseling/coordinating care for tunneled HD catheter

## 2015-07-03 NOTE — Progress Notes (Signed)
PROGRESS NOTE    Thunder Fontenot  ZOX:[id number] DOB: 07/07/1975 DOA: 06/23/2015 PCP: [name], MD   Brief Narrative: Charles [name] is a 40 y.o. male with medical history significant of Multiple Myeloma, CKD 3, back pain, currently undergoing chemotherapy, reports he was started on a new chemotherapeutic agent recently.  Patient admitted with Nausea, vomiting, diarrhea. Thought to be to chemo related. Subsequently he developed Press syndrome , Malignant HTN, visual disturbabce and ?PNA. Neurology consulted.  BP better. He spike fevers, chest x ray consistent with ?PNA. He was started on Vancomycin and cefepime. Vancomycin stopped 5-30 due to worsening renal function. Nephrology consulted. Also spike fever 5-29 after several days of broad spectrum abx, ID consulted. Fever is deemed to be likely tumor fever, patient is off abx for now Mri spine showed MM progression and cord involvement, oncology has contacted IR. Worsening of renal function, nephrology recommended patient to be [facility].  Assessment & Plan:   Principal Problem:   AKI (acute kidney injury) ([place]) Active Problems:   Multiple myeloma (HCC)   CKD (chronic kidney disease) stage 3, GFR 30-59 ml/min   Nausea with vomiting   Blurry vision, bilateral   HTN (hypertension), malignant   PRES (posterior reversible encephalopathy syndrome)   FUO (fever of unknown origin)   Back pain   Multiple myeloma not having achieved remission (HCC)   Multiple myeloma in relapse (HCC)   Symptomatic anemia  HTN Emergency, Pres syndrome; (? side effect from protesome inhibitor? Please see oncology and renal notes) Patient report vision changes the morning 5-26, MRI consistent with Pres.   neurology consulted recommended repeat  MRI in 1 or 2 weeks from 5/26. off nicardipine  Gtt on 5/31,  Patient was transitioned to Hydralazine 30m tid and and metoprolol 775mbid  On 6/1, sbp in low 100's to  110's, decrease hydralazine to 25tid and lopressor to 5078mid on 6/1, continue titrate bp meds to achieve goal.  SBP goal 120--140  Per neurology  Fevers;  chest x ray ? PNA. Uti? Urine multiples bacteria, repeated urine culture no growth to date.   Received cefepime and  vancomycin for 5 days.  WBC fluctuates. Still Spike fever 5-30 ID consulted due to persistent fevers. Pan CT unrevealing, MRI spine no infection  Fever possible from multiple myeloma,  ID recommends observe off abx, and signed off  [place] CKD 3, baseline is 3.3 - US Koreagative for hydronephrosis.  -cr increasing,  Renal consulted who recommended transfer to [place] for dialysis, patient placement notified.   [place] ([place],  --restarted treatment with carfizomib, lenalidomide, dexamethasone, on 5/31, continue acyclovir prophylaxis. Prn pain control -mri spine with cord compression, oncology talked to rad onc -Oncology Dr MagJana Hakimput appreciated: receieving XRT with daily (M-F) treatments currently scheduled through 6/14   Nausea with vomiting on admission -could be from chemo, ? Infection, Ct ab no obstruction. -seems resolved   Anemia; secondary to MM;.  Received one unit 5-30. Keep hgb> 7.    DVT prophylaxis; Lovenox.  Code Status: full code.  Family Communication: wife at bedside-- patient declined spanish interpreter Disposition Plan:    Consultants:   Oncology Dr MagJana Hakim Neurology- signed off  ID- signed off 6/1  Nephrology- 5/29  ID  Procedures;   Renal US;Koreaedical renal disease, no hydronephrosis.   Antimicrobials:  Ceftriaxone 5-24--cefepime 5-25  Vancomycin 5-25   Subjective: Getting blood drawn  No SOB, no CP  Objective: Filed Vitals:   07/02/15 1403 07/02/15 2010 07/03/15 0400 07/03/15 0621  BP: 113/71 129/72 111/64 114/66  Pulse: 72 80 77 64  Temp: 97.5 F (36.4 C) 97.3 F (36.3 C) 97.4 F (36.3 C)   TempSrc:  Oral Oral Oral   Resp: 16 18 16    Height:  5' 1"  (1.549 m)    Weight:  73.1 kg (161 lb 2.5 oz)    SpO2: 100% 99% 98%     Intake/Output Summary (Last 24 hours) at 07/03/15 0912 Last data filed at 07/02/15 2200  Gross per 24 hour  Intake    570 ml  Output    155 ml  Net    415 ml   Filed Weights   07/01/15 0341 07/01/15 1638 07/02/15 2010  Weight: 69.9 kg (154 lb 1.6 oz) 70.6 kg (155 lb 10.3 oz) 73.1 kg (161 lb 2.5 oz)    Examination:  General exam: pale, NDA  Respiratory system: Clear to auscultation. Respiratory effort normal. Cardiovascular system: S1 & S2 heard, RRR. No JVD, murmurs, rubs, gallops or clicks. trace pedal edema. Gastrointestinal system: Abdomen is nondistended, soft and nontender. No organomegaly or masses felt. Normal bowel sounds heard.    Data Reviewed: I have personally reviewed following labs and imaging studies  CBC:  Recent Labs Lab 06/28/15 0405 06/29/15 0311 06/30/15 0001 06/30/15 0108 06/30/15 1023 07/01/15 0327 07/02/15 0511  WBC 19.3* 17.4*  --  16.3*  --  18.1* 17.4*  NEUTROABS 15.7* 13.4*  --  12.2*  --  12.5* 14.3*  HGB 7.2* 7.0* 6.8* 6.8* 9.5* 7.8* 8.6*  HCT 22.1* 21.7* 21.0* 21.1* 28.7* 23.8* 26.3*  MCV 94.8 96.4  --  93.8  --  94.1 93.9  PLT 557* 434*  --  413*  --  454* 771*   Basic Metabolic Panel:  Recent Labs Lab 06/28/15 0405 06/29/15 0311 06/30/15 0108 07/01/15 0327 07/02/15 0511  NA 134* 137 134* 133* 132*  K 3.3* 3.3* 3.5 3.6 4.6  CL 103 109 103 103 102  CO2 20* 18* 19* 16* 15*  GLUCOSE 107* 123* 146* 100* 154*  BUN 65* 69* 79* 91* 101*  CREATININE 3.84* 3.53* 4.05* 4.74* 6.34*  CALCIUM 8.5* 8.3* 8.4* 8.2* 7.7*   GFR: Estimated Creatinine Clearance: 13.3 mL/min (by C-G formula based on Cr of 6.34). Liver Function Tests:  Recent Labs Lab 06/27/15 0328 06/28/15 0405  AST 10* 14*  ALT 11* 14*  ALKPHOS 111 131*  BILITOT 0.7 0.9  PROT 7.0 6.5  ALBUMIN 1.6* 1.6*   No results for input(s): LIPASE,  AMYLASE in the last 168 hours. No results for input(s): AMMONIA in the last 168 hours. Coagulation Profile: No results for input(s): INR, PROTIME in the last 168 hours. Cardiac Enzymes: No results for input(s): CKTOTAL, CKMB, CKMBINDEX, TROPONINI in the last 168 hours. BNP (last 3 results) No results for input(s): PROBNP in the last 8760 hours. HbA1C: No results for input(s): HGBA1C in the last 72 hours. CBG: No results for input(s): GLUCAP in the last 168 hours. Lipid Profile: No results for input(s): CHOL, HDL, LDLCALC, TRIG, CHOLHDL, LDLDIRECT in the last 72 hours. Thyroid Function Tests: No results for input(s): TSH, T4TOTAL, FREET4, T3FREE, THYROIDAB in the last 72 hours. Anemia Panel: No results for input(s): VITAMINB12, FOLATE, FERRITIN, TIBC, IRON, RETICCTPCT in the last 72 hours. Sepsis Labs: No results for input(s): PROCALCITON, LATICACIDVEN in the last 168 hours.  Recent Results (from the past 240  hour(s))  Urine culture     Status: Abnormal   Collection Time: 06/23/15  2:30 PM  Result Value Ref Range Status   Specimen Description URINE, CLEAN CATCH  Final   Special Requests NONE  Final   Culture MULTIPLE SPECIES PRESENT, SUGGEST RECOLLECTION (A)  Final   Report Status 06/25/2015 FINAL  Final  Blood Culture (routine x 2)     Status: None   Collection Time: 06/23/15  2:45 PM  Result Value Ref Range Status   Specimen Description BLOOD RIGHT ANTECUBITAL  Final   Special Requests BOTTLES DRAWN AEROBIC AND ANAEROBIC 5 CC EA  Final   Culture   Final    NO GROWTH 5 DAYS Performed at [facility]    Report Status 06/28/2015 FINAL  Final  Blood Culture (routine x 2)     Status: None   Collection Time: 06/23/15  3:29 PM  Result Value Ref Range Status   Specimen Description BLOOD RIGHT HAND  Final   Special Requests BOTTLES DRAWN AEROBIC ONLY 9ML  Final   Culture   Final    NO GROWTH 5 DAYS Performed at [facility]    Report Status 06/28/2015 FINAL   Final  Culture, blood (routine x 2) Call MD if unable to obtain prior to antibiotics being given     Status: None   Collection Time: 06/25/15  5:05 PM  Result Value Ref Range Status   Specimen Description BLOOD RIGHT ARM  Final   Special Requests BOTTLES DRAWN AEROBIC AND ANAEROBIC 8 CC EA  Final   Culture   Final    NO GROWTH 5 DAYS Performed at [facility]    Report Status 06/30/2015 FINAL  Final  Culture, blood (routine x 2) Call MD if unable to obtain prior to antibiotics being given     Status: None   Collection Time: 06/25/15  5:08 PM  Result Value Ref Range Status   Specimen Description BLOOD RIGHT HAND  Final   Special Requests BOTTLES DRAWN AEROBIC AND ANAEROBIC 10 CC EA  Final   Culture   Final    NO GROWTH 5 DAYS Performed at [facility]    Report Status 06/30/2015 FINAL  Final  Urine culture     Status: None   Collection Time: 06/25/15  9:17 PM  Result Value Ref Range Status   Specimen Description URINE, CLEAN CATCH  Final   Special Requests NONE  Final   Culture NO GROWTH Performed at [facility]   Final   Report Status 06/27/2015 FINAL  Final  MRSA PCR Screening     Status: None   Collection Time: 06/26/15  5:00 PM  Result Value Ref Range Status   MRSA by PCR NEGATIVE NEGATIVE Final    Comment:        The GeneXpert MRSA Assay (FDA approved for NASAL specimens only), is one component of a comprehensive MRSA colonization surveillance program. It is not intended to diagnose MRSA infection nor to guide or monitor treatment for MRSA infections.          Radiology Studies: Mr Thoracic Spine Wo Contrast  07/02/2015  CLINICAL DATA:  Multiple myeloma.  Back pain.  Fever EXAM: MRI THORACIC AND LUMBAR SPINE WITHOUT CONTRAST TECHNIQUE: Multiplanar and multiecho pulse sequences of the thoracic and lumbar spine were obtained without intravenous contrast. COMPARISON:  CT chest abdomen and pelvis 06/30/2015 FINDINGS: MRI THORACIC SPINE  FINDINGS Alignment:  Normal Vertebrae: Diffuse infiltration of the bone  marrow throughout all vertebral bodies consistent with myeloma. This also involves the posterior elements and ribs bilaterally. There is extradural tumor in the spinal canal posteriorly at T4, T7, and T 10-11. Extraosseous mass lesions in the ribs bilaterally consistent with myeloma. Cord: Posterior extradural mass at the T4 level causing mild spinal stenosis. Posterior extradural mass at the T7 level causing moderate spinal stenosis and mild compression of the cord. Cord has normal signal. Mild posterior extradural tumor at T10-11 extending to the right. This is causing right foraminal stenosis. No significant spinal stenosis. Paraspinal and other soft tissues: Paraspinous muscles normal. Small bilateral pleural effusions. Diffuse rib infiltration by mall myeloma with multiple extradural rib lesions. Disc levels: Negative for disc protrusion. No significant disc degeneration. MRI LUMBAR SPINE FINDINGS Segmentation:  Normal.  Lowest disc space L5-S1 Alignment:  Normal Vertebrae: Diffuse infiltration of the bone marrow by myeloma. This involves all vertebral bodies and extends into the posterior elements. There is involvement of the pelvis. Sacrum is diffusely involved. Negative for fracture. Conus medullaris: Extends to the L1 level and appears normal. Paraspinal and other soft tissues: No retroperitoneal adenopathy. Extraosseous mass from the left iliac wing displacing iliopsoas anteriorly Disc levels: L1-2:  Negative L2-3:  Negative L3-4:  Bilateral facet degeneration.  Normal disc space L4-5:  Bilateral facet degeneration.  Normal disc space L5-S1: Small central disc protrusion without neural impingement. IMPRESSION: MR THORACIC SPINE IMPRESSION Extensive and diffuse infiltration of the bone marrow with myeloma. No fracture. Posterior extradural mass the T4 level causing mild spinal stenosis. Posterior epidural mass at the T7 level causing  moderate spinal stenosis and mild compression of the cord. MR LUMBAR SPINE IMPRESSION Diffuse bone marrow infiltration throughout the lumbar spine, sacrum, and pelvis. No fracture. No tumor is seen in the lumbar spinal canal. No evidence of infection in the thoracic or lumbar spine. Electronically Signed   By: Franchot Gallo M.D.   On: 07/02/2015 09:00   Mr Lumbar Spine Wo Contrast  07/02/2015  CLINICAL DATA:  Multiple myeloma.  Back pain.  Fever EXAM: MRI THORACIC AND LUMBAR SPINE WITHOUT CONTRAST TECHNIQUE: Multiplanar and multiecho pulse sequences of the thoracic and lumbar spine were obtained without intravenous contrast. COMPARISON:  CT chest abdomen and pelvis 06/30/2015 FINDINGS: MRI THORACIC SPINE FINDINGS Alignment:  Normal Vertebrae: Diffuse infiltration of the bone marrow throughout all vertebral bodies consistent with myeloma. This also involves the posterior elements and ribs bilaterally. There is extradural tumor in the spinal canal posteriorly at T4, T7, and T 10-11. Extraosseous mass lesions in the ribs bilaterally consistent with myeloma. Cord: Posterior extradural mass at the T4 level causing mild spinal stenosis. Posterior extradural mass at the T7 level causing moderate spinal stenosis and mild compression of the cord. Cord has normal signal. Mild posterior extradural tumor at T10-11 extending to the right. This is causing right foraminal stenosis. No significant spinal stenosis. Paraspinal and other soft tissues: Paraspinous muscles normal. Small bilateral pleural effusions. Diffuse rib infiltration by mall myeloma with multiple extradural rib lesions. Disc levels: Negative for disc protrusion. No significant disc degeneration. MRI LUMBAR SPINE FINDINGS Segmentation:  Normal.  Lowest disc space L5-S1 Alignment:  Normal Vertebrae: Diffuse infiltration of the bone marrow by myeloma. This involves all vertebral bodies and extends into the posterior elements. There is involvement of the pelvis.  Sacrum is diffusely involved. Negative for fracture. Conus medullaris: Extends to the L1 level and appears normal. Paraspinal and other soft tissues: No retroperitoneal adenopathy. Extraosseous mass from the left  iliac wing displacing iliopsoas anteriorly Disc levels: L1-2:  Negative L2-3:  Negative L3-4:  Bilateral facet degeneration.  Normal disc space L4-5:  Bilateral facet degeneration.  Normal disc space L5-S1: Small central disc protrusion without neural impingement. IMPRESSION: MR THORACIC SPINE IMPRESSION Extensive and diffuse infiltration of the bone marrow with myeloma. No fracture. Posterior extradural mass the T4 level causing mild spinal stenosis. Posterior epidural mass at the T7 level causing moderate spinal stenosis and mild compression of the cord. MR LUMBAR SPINE IMPRESSION Diffuse bone marrow infiltration throughout the lumbar spine, sacrum, and pelvis. No fracture. No tumor is seen in the lumbar spinal canal. No evidence of infection in the thoracic or lumbar spine. Electronically Signed   By: Franchot Gallo M.D.   On: 07/02/2015 09:00        Scheduled Meds: . acyclovir  400 mg Oral Daily  . enoxaparin (LOVENOX) injection  30 mg Subcutaneous Q24H  . feeding supplement (ENSURE ENLIVE)  237 mL Oral TID BM  . hydrALAZINE  25 mg Oral Q8H  . lenalidomide  10 mg Oral Q48H  . metoprolol tartrate  50 mg Oral BID  . mirtazapine  15 mg Oral QHS  . morphine  30 mg Oral Q12H  . polyethylene glycol  17 g Oral Daily  . senna-docusate  2 tablet Oral BID   Continuous Infusions:     LOS: 10 days    Time spent: 25 minutes.     [place], DO [facility] Pager [phone]  If 7PM-7AM, please contact night-coverage [email or web address] Password [email or web address] 07/03/2015, 9:12 AM

## 2015-07-03 NOTE — Progress Notes (Signed)
  [facility] Progress Note   Subjective:  Alert   Filed Vitals:   07/03/15 1345 07/03/15 1350 07/03/15 1355 07/03/15 1402  BP: 121/72 116/75 116/74 114/70  Pulse: 76 75 75 74  Temp:      TempSrc:      Resp: '14 19 16 24  '$ Height:      Weight:      SpO2: 100% 100% 100% 100%    Inpatient medications: . acyclovir  400 mg Oral QHS  .  ceFAZolin (ANCEF) IV  2 g Intravenous to XRAY  . enoxaparin (LOVENOX) injection  30 mg Subcutaneous Q24H  . feeding supplement (ENSURE ENLIVE)  237 mL Oral TID BM  . fentaNYL      . heparin      . hydrALAZINE  25 mg Oral Q8H  . lenalidomide  10 mg Oral Q48H  . lidocaine      . metoprolol tartrate  50 mg Oral BID  . midazolam      . mirtazapine  15 mg Oral QHS  . morphine  30 mg Oral Q12H  . polyethylene glycol  17 g Oral Daily  . senna-docusate  2 tablet Oral BID     sodium chloride, acetaminophen, albuterol, albuterol, alteplase, alteplase, diatrizoate meglumine-sodium, diphenhydrAMINE, diphenhydrAMINE, diphenhydrAMINE, diphenhydrAMINE, EPINEPHrine, EPINEPHrine, EPINEPHrine, EPINEPHrine, EPINEPHrine, EPINEPHrine, EPINEPHrine, EPINEPHrine, famotidine, heparin lock flush, heparin lock flush, heparin lock flush, heparin lock flush, heparin lock flush, heparin lock flush, HYDROcodone-acetaminophen, methylPREDNISolone sodium succinate, methylPREDNISolone sodium succinate, prochlorperazine, sodium chloride flush, sodium chloride flush, sodium chloride flush, sodium chloride flush  Exam: Alert, Ox 3 Throat clear  No jve Chest clear bilat RRR soft SEM, no RG Abd soft ntnd no mass or ascites +bs GU normal male Ext no LE edema Neuro is alert, Ox 3 , nf, no asterixis  UA by undersigned > urine dark brownish-red, micro w sheets of RBC's (nondysmorphic), 20-30 WBC/ hpf, moderate gran casts, some degen cellular casts, minimal bact Renal US 5/24 - 12 cm kidneys, no hydro, ^'d echo mild CXR - no acute disease (5/25), new mass like  shadows  Assessment: 1  Renal failure - progressive renal failure over last 5 mos in pt w known myeloma since 12/15.  Oliguric now w rapidly rising B/Cr. 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  Had [facility] placed and is for first HD today, HD again tomorrow.  2  Vol- is edematous, dc'd IVF 3  Mult myeloma - got carfilzomab this week 4  PRES syndrome - resolving clinically 5  HTN urgency - resolved  Plan - HD tonight and tomorrow   [name] MD [facility] pager [phone]    cell [phone] 07/02/2015, 6:03 PM   Recent Labs Lab 06/30/15 0108 07/01/15 0327 07/02/15 0511  NA 134* 133* 132*  K 3.5 3.6 4.6  CL 103 103 102  CO2 19* 16* 15*  GLUCOSE 146* 100* 154*  BUN 79* 91* 101*  CREATININE 4.05* 4.74* 6.34*  CALCIUM 8.4* 8.2* 7.7*    Recent Labs Lab 06/27/15 0328 06/28/15 0405  AST 10* 14*  ALT 11* 14*  ALKPHOS 111 131*  BILITOT 0.7 0.9  PROT 7.0 6.5  ALBUMIN 1.6* 1.6*    Recent Labs Lab 07/01/15 0327 07/02/15 0511 07/03/15 0859  WBC 18.1* 17.4* 15.0*  NEUTROABS 12.5* 14.3* 12.4*  HGB 7.8* 8.6* 7.7*  HCT 23.8* 26.3* 24.3*  MCV 94.1 93.9 95.3  PLT 454* 507* 432*

## 2015-07-03 NOTE — Progress Notes (Signed)
Pharmacy to review anticoagulants/antiplatelets post-IR procedure.  On prophylactic Lovenox prior to procedure. Deemed low risk for bleeding.  Lovenox '30mg'$  subQ can be resumed at next dose interval tonight- due at 2000.  Desere Gwin D. Haim Hansson, PharmD, BCPS Clinical Pharmacist Pager: [phone] 07/03/2015 6:09 PM

## 2015-07-03 NOTE — Progress Notes (Signed)
Update note: Events of past 24 hours noted; patient now at [place] awaiting HD  From a myeloma point of view, patient received carfilzomib 5/31 and 6/1; next dose is not scheduled until 6/8. If patient goes on HD carfilzomib should be given after HD, w no dose reduction. Patient receives this as part of the patient assistance program and our pharmacy will work with the [facility] pharmacy to operationalize this.  Patient is also on lenalidomide 10 mg po QOD (using his own supply, administered through pharmacy). He will receive dexamethasone 20 mg pop on 6/3 and 6/4  Finally he is receieving XRT with daily (M-F) treatments currently scheduled through 6/14  I will see patient in AM Sat (am on call this weekend)-- please let me know if I can be of help before then

## 2015-07-03 NOTE — Care Management (Signed)
[facility] review completed.  Adron Bene RN MPH, case manager, [phone]

## 2015-07-04 DIAGNOSIS — N184 Chronic kidney disease, stage 4 (severe): Secondary | ICD-10-CM

## 2015-07-04 DIAGNOSIS — N19 Unspecified kidney failure: Secondary | ICD-10-CM

## 2015-07-04 LAB — RENAL FUNCTION PANEL
ANION GAP: 11 (ref 5–15)
Albumin: 1.1 g/dL — ABNORMAL LOW (ref 3.5–5.0)
BUN: 85 mg/dL — ABNORMAL HIGH (ref 6–20)
CO2: 22 mmol/L (ref 22–32)
Calcium: 7.6 mg/dL — ABNORMAL LOW (ref 8.9–10.3)
Chloride: 101 mmol/L (ref 101–111)
Creatinine, Ser: 5.81 mg/dL — ABNORMAL HIGH (ref 0.61–1.24)
GFR calc Af Amer: 13 mL/min — ABNORMAL LOW (ref >60)
GFR calc non Af Amer: 11 mL/min — ABNORMAL LOW (ref >60)
GLUCOSE: 93 mg/dL (ref 65–99)
PHOSPHORUS: 8.9 mg/dL — AB (ref 2.5–4.6)
POTASSIUM: 4.3 mmol/L (ref 3.5–5.1)
Sodium: 134 mmol/L — ABNORMAL LOW (ref 135–145)

## 2015-07-04 LAB — FERRITIN: Ferritin: 2488 ng/mL — ABNORMAL HIGH (ref 24–336)

## 2015-07-04 LAB — CBC
HEMATOCRIT: 23.4 % — AB (ref 39.0–52.0)
HEMOGLOBIN: 7.3 g/dL — AB (ref 13.0–17.0)
MCH: 29.4 pg (ref 26.0–34.0)
MCHC: 31.2 g/dL (ref 30.0–36.0)
MCV: 94.4 fL (ref 78.0–100.0)
Platelets: 339 10*3/uL (ref 150–400)
RBC: 2.48 MIL/uL — ABNORMAL LOW (ref 4.22–5.81)
RDW: 15 % (ref 11.5–15.5)
WBC: 6.8 10*3/uL (ref 4.0–10.5)

## 2015-07-04 LAB — IRON AND TIBC
Iron: 56 ug/dL (ref 45–182)
SATURATION RATIOS: 43 % — AB (ref 17.9–39.5)
TIBC: 129 ug/dL — ABNORMAL LOW (ref 250–450)
UIBC: 73 ug/dL

## 2015-07-04 MED ORDER — DEXAMETHASONE 4 MG PO TABS
20.0000 mg | ORAL_TABLET | Freq: Every day | ORAL | Status: AC
  Administered 2015-07-04 – 2015-07-05 (×2): 20 mg via ORAL
  Filled 2015-07-04 (×2): qty 5

## 2015-07-04 MED ORDER — SODIUM CHLORIDE 0.9 % IV SOLN: 100.0000 mL | INTRAVENOUS | Status: DC | PRN

## 2015-07-04 MED ORDER — LIDOCAINE HCL (PF) 1 % IJ SOLN: 5.0000 mL | INTRAMUSCULAR | Status: DC | PRN

## 2015-07-04 MED ORDER — LIDOCAINE-PRILOCAINE 2.5-2.5 % EX CREA: 1.0000 "application " | TOPICAL_CREAM | CUTANEOUS | Status: DC | PRN

## 2015-07-04 MED ORDER — PENTAFLUOROPROP-TETRAFLUOROETH EX AERO: 1.0000 "application " | INHALATION_SPRAY | CUTANEOUS | Status: DC | PRN

## 2015-07-04 MED ORDER — ALTEPLASE 2 MG IJ SOLR
2.0000 mg | Freq: Once | INTRAMUSCULAR | Status: DC | PRN
Start: 2015-07-04 — End: 2015-07-04

## 2015-07-04 MED ORDER — ALTEPLASE 2 MG IJ SOLR: 2.0000 mg | Freq: Once | INTRAMUSCULAR | Status: DC | PRN

## 2015-07-04 MED ORDER — HEPARIN SODIUM (PORCINE) 1000 UNIT/ML DIALYSIS: 1000.0000 [IU] | INTRAMUSCULAR | Status: DC | PRN

## 2015-07-04 MED ORDER — HEPARIN SODIUM (PORCINE) 1000 UNIT/ML DIALYSIS: 2000.0000 [IU] | INTRAMUSCULAR | Status: DC | PRN

## 2015-07-04 MED ORDER — HEPARIN SODIUM (PORCINE) 1000 UNIT/ML DIALYSIS: 3000.0000 [IU] | INTRAMUSCULAR | Status: DC | PRN

## 2015-07-04 NOTE — Progress Notes (Signed)
Patient was brought to Hemodialysis accompanied by nurse- report given to dialysis nurse.

## 2015-07-04 NOTE — Consult Note (Signed)
[facility] [place] HISTORY AND PHYSICAL   History of Present Illness:  Patient is a 40 y.o. year old male who presents for placement of a permanent hemodialysis access. The patient is right handed.  The patient is currently on hemodialysis.  The cause of renal failure is thought to be secondary to multiple myeloma.  Other chronic medical problems include elevated cholesterol.  He does not speak much English so obtaining history fairly difficult.  Past Medical History  Diagnosis Date  . IgG myeloma ([place])   . CKD (chronic kidney disease), stage III   . Hypercholesterolemia   . Lytic bone lesions on xray     History reviewed. No pertinent past surgical history.   Social History Social History  Substance Use Topics  . Smoking status: Never Smoker   . Smokeless tobacco: Never Used  . Alcohol Use: No    Family History Family History  Problem Relation Age of Onset  . Diabetes Father     Allergies  Allergies  Allergen Reactions  . Ciprofloxacin Rash  . Mobic [Meloxicam] Rash     Current Facility-Administered Medications  Medication Dose Route Frequency Provider Last Rate Last Dose  . 0.9 %  sodium chloride infusion   Intravenous Once PRN [name], MD      . 0.9 %  sodium chloride infusion  100 mL Intravenous PRN Roney Jaffe, MD      . 0.9 %  sodium chloride infusion  100 mL Intravenous PRN Roney Jaffe, MD      . 0.9 %  sodium chloride infusion  100 mL Intravenous PRN Roney Jaffe, MD      . 0.9 %  sodium chloride infusion  100 mL Intravenous PRN Roney Jaffe, MD      . acetaminophen (TYLENOL) tablet 650 mg  650 mg Oral Q6H PRN Belkys A Regalado, MD   650 mg at 06/29/15 1502  . acyclovir (ZOVIRAX) tablet 400 mg  400 mg Oral QHS Geradine Girt, DO   400 mg at 07/04/15 0023  . albuterol (PROVENTIL) (2.5 MG/3ML) 0.083% nebulizer solution 2.5 mg  2.5 mg Nebulization Once PRN [name], MD      . albuterol (PROVENTIL) (2.5 MG/3ML)  0.083% nebulizer solution 2.5 mg  2.5 mg Nebulization Once PRN [name], MD      . alteplase (CATHFLO ACTIVASE) injection 2 mg  2 mg Intracatheter Once PRN Roney Jaffe, MD      . alteplase (CATHFLO ACTIVASE) injection 2 mg  2 mg Intracatheter Once PRN Roney Jaffe, MD      . dexamethasone (DECADRON) tablet 20 mg  20 mg Oral Daily Jani Gravel, MD      . diatrizoate meglumine-sodium (GASTROGRAFIN) 66-10 % solution 15 mL  15 mL Oral PRN Belkys A Regalado, MD      . diphenhydrAMINE (BENADRYL) injection 25 mg  25 mg Intravenous Once PRN [name], MD      . diphenhydrAMINE (BENADRYL) injection 25 mg  25 mg Intravenous Once PRN [name], MD      . diphenhydrAMINE (BENADRYL) injection 50 mg  50 mg Intravenous Once PRN [name], MD      . diphenhydrAMINE (BENADRYL) injection 50 mg  50 mg Intravenous Once PRN [name], MD      . EPINEPHrine (ADRENALIN) 0.1 MG/ML injection 0.25 mg  0.25 mg Intravenous Once PRN [name], MD      . EPINEPHrine (ADRENALIN) 0.1 MG/ML  injection 0.25 mg  0.25 mg Intravenous Once PRN [name], MD      . EPINEPHrine (ADRENALIN) 0.1 MG/ML injection 0.25 mg  0.25 mg Intravenous Once PRN [name], MD      . EPINEPHrine (ADRENALIN) 0.1 MG/ML injection 0.25 mg  0.25 mg Intravenous Once PRN [name], MD      . EPINEPHrine (ADRENALIN) injection 0.5 mg  0.5 mg Subcutaneous Once PRN [name], MD      . EPINEPHrine (ADRENALIN) injection 0.5 mg  0.5 mg Subcutaneous Once PRN [name], MD      . EPINEPHrine (ADRENALIN) injection 0.5 mg  0.5 mg Subcutaneous Once PRN [name], MD      . EPINEPHrine (ADRENALIN) injection 0.5 mg  0.5 mg Subcutaneous Once PRN [name], MD      . famotidine (PEPCID) IVPB 20 mg premix  20 mg Intravenous Once PRN [name], MD      . feeding supplement (ENSURE ENLIVE) (ENSURE ENLIVE) liquid 237 mL  237 mL Oral TID BM Clayton Bibles, RD   237 mL  at 07/04/15 1000  . heparin injection 1,000 Units  1,000 Units Dialysis PRN Roney Jaffe, MD      . heparin injection 1,000 Units  1,000 Units Dialysis PRN Roney Jaffe, MD      . heparin injection 2,000 Units  2,000 Units Dialysis PRN Roney Jaffe, MD      . Derrill Memo ON 07/05/2015] heparin injection 3,000 Units  3,000 Units Dialysis PRN Roney Jaffe, MD      . heparin lock flush 100 unit/mL  500 Units Intracatheter Daily PRN [name], MD      . heparin lock flush 100 unit/mL  250 Units Intracatheter PRN [name], MD      . heparin lock flush 100 unit/mL  500 Units Intracatheter Once PRN [name], MD      . heparin lock flush 100 unit/mL  250 Units Intracatheter Once PRN [name], MD      . heparin lock flush 100 unit/mL  500 Units Intracatheter Once PRN [name], MD      . heparin lock flush 100 unit/mL  250 Units Intracatheter Once PRN [name], MD      . hydrALAZINE (APRESOLINE) tablet 25 mg  25 mg Oral Q8H Roney Jaffe, MD   25 mg at 07/04/15 1610  . HYDROcodone-acetaminophen (NORCO) 10-325 MG per tablet 1-2 tablet  1-2 tablet Oral Q4H PRN [name], MD   1 tablet at 07/01/15 1419  . lenalidomide (REVLIMID) capsule 10 mg  10 mg Oral Q48H [name], MD   10 mg at 07/03/15 1715  . lidocaine (PF) (XYLOCAINE) 1 % injection 5 mL  5 mL Intradermal PRN Roney Jaffe, MD      . lidocaine (PF) (XYLOCAINE) 1 % injection 5 mL  5 mL Intradermal PRN Roney Jaffe, MD      . lidocaine-prilocaine (EMLA) cream 1 application  1 application Topical PRN Roney Jaffe, MD      . lidocaine-prilocaine (EMLA) cream 1 application  1 application Topical PRN Roney Jaffe, MD      . methylPREDNISolone sodium succinate (SOLU-MEDROL) 125 mg/2 mL injection 125 mg  125 mg Intravenous Once PRN [name], MD      . methylPREDNISolone sodium succinate (SOLU-MEDROL) 125 mg/2 mL injection 125 mg  125 mg Intravenous Once PRN [name], MD      .  metoprolol (LOPRESSOR) tablet 50 mg  50 mg Oral BID Roney Jaffe, MD   50 mg at 07/04/15 1021  . mirtazapine (REMERON) tablet 15 mg  15 mg Oral QHS [name], MD   15 mg at 07/02/15 2234  . morphine (MS CONTIN) 12 hr tablet 30 mg  30 mg Oral Q12H [name], MD   30 mg at 07/04/15 1021  . pentafluoroprop-tetrafluoroeth (GEBAUERS) aerosol 1 application  1 application Topical PRN Roney Jaffe, MD      . pentafluoroprop-tetrafluoroeth (GEBAUERS) aerosol 1 application  1 application Topical PRN Roney Jaffe, MD      . polyethylene glycol (MIRALAX / GLYCOLAX) packet 17 g  17 g Oral Daily [name], MD   17 g at 07/04/15 1021  . prochlorperazine (COMPAZINE) injection 10 mg  10 mg Intravenous TID PRN Belkys A Regalado, MD   10 mg at 06/30/15 1125  . senna-docusate (Senokot-S) tablet 2 tablet  2 tablet Oral BID Florencia Reasons, MD   2 tablet at 07/04/15 1021  . sodium chloride flush (NS) 0.9 % injection 10 mL  10 mL Intracatheter PRN [name], MD      . sodium chloride flush (NS) 0.9 % injection 10 mL  10 mL Intracatheter PRN [name], MD      . sodium chloride flush (NS) 0.9 % injection 3 mL  3 mL Intravenous PRN [name], MD   3 mL at 07/02/15 2235  . sodium chloride flush (NS) 0.9 % injection 3 mL  3 mL Intravenous PRN [name], MD        ROS:   Unable to obtain due to language barrier  Physical Examination  Filed Vitals:   07/04/15 1330 07/04/15 1400 07/04/15 1430 07/04/15 1500  BP: 125/68 118/65 120/74 121/74  Pulse: 89 88 89 89  Temp:      TempSrc:      Resp: 10 11    Height:      Weight:      SpO2: 96% 95%      Body mass index is 29.97 kg/(m^2).  General:  Alert and oriented, no acute distress HEENT: Normal Neck: No bruit or JVD, right side dialysis catheter Pulmonary: Clear to auscultation bilaterally Cardiac: Regular Rate and Rhythm  Gastrointestinal: Soft, non-tender, non-distended, no mass Skin:  No rash Extremity Pulses:  2+ radial, brachial pulses bilaterally, IVs left wrist, right forearm Musculoskeletal: No deformity or edema  Neurologic: Upper and lower extremity motor 5/5 and symmetric  DATA:  CBC    Component Value Date/Time   WBC 6.8 07/04/2015 1319   WBC 19.8* 06/23/2015 1215   RBC 2.48* 07/04/2015 1319   RBC 3.11* 06/23/2015 1215   HGB 7.3* 07/04/2015 1319   HGB 9.6* 06/23/2015 1215   HCT 23.4* 07/04/2015 1319   HCT 29.0* 06/23/2015 1215   PLT 339 07/04/2015 1319   PLT 785* 06/23/2015 1215   MCV 94.4 07/04/2015 1319   MCV 93.3 06/23/2015 1215   MCH 29.4 07/04/2015 1319   MCH 30.8 06/23/2015 1215   MCHC 31.2 07/04/2015 1319   MCHC 33.1 06/23/2015 1215   RDW 15.0 07/04/2015 1319   RDW 14.5 06/23/2015 1215   LYMPHSABS 1.3 07/03/2015 0859   LYMPHSABS 1.7 06/23/2015 1215   MONOABS 1.4* 07/03/2015 0859   MONOABS 1.3* 06/23/2015 1215   EOSABS 0.0 07/03/2015 0859   EOSABS 0.0 06/23/2015 1215   BASOSABS 0.0 07/03/2015 0859   BASOSABS 0.0 06/23/2015 1215  BMET    Component Value Date/Time   NA 134* 07/04/2015 1321   NA 134* 06/23/2015 1215   K 4.3 07/04/2015 1321   K 4.2 06/23/2015 1215   CL 101 07/04/2015 1321   CO2 22 07/04/2015 1321   CO2 20* 06/23/2015 1215   GLUCOSE 93 07/04/2015 1321   GLUCOSE 139 06/23/2015 1215   BUN 85* 07/04/2015 1321   BUN 63.9* 06/23/2015 1215   CREATININE 5.81* 07/04/2015 1321   CREATININE 4.3 Repeated and Verified* 06/23/2015 1215   CALCIUM 7.6* 07/04/2015 1321   CALCIUM 13.4 Repeated and Verified* 06/23/2015 1215   GFRNONAA 11* 07/04/2015 1321   GFRAA 13* 07/04/2015 1321    ASSESSMENT: Needs hemodialysis access   PLAN:1.  Vein mapping ordered 2. When vein map complete will review and discuss options with pt with interpreter present  Ruta Hinds, MD [facility] [place] Office: [phone] Pager: [phone]

## 2015-07-04 NOTE — Progress Notes (Signed)
Dialysis treatment completed.  2500 mL ultrafiltrated.  2000 mL net fluid removal.  Patient status unchanged. Lung sounds clear to ausculation in all fields. No edema. Cardiac: NSR.  Cleansed RIJ catheter with chlorhexidine.  Disconnected lines and flushed ports with saline per protocol.  Ports locked with heparin and capped per protocol.    Report given to bedside, RN Ginger.

## 2015-07-04 NOTE — Progress Notes (Signed)
Subjective:  Feels better with 1st hd yest . HD today also   Objective Vital signs in last 24 hours: Filed Vitals:   07/03/15 2320 07/04/15 0001 07/04/15 0547 07/04/15 0933  BP: 136/84 123/62 131/74 123/63  Pulse: 96 89 97 97  Temp: 97.5 F (36.4 C) 97.6 F (36.4 C) 97.7 F (36.5 C) 98.6 F (37 C)  TempSrc: Oral Oral Oral Oral  Resp: '15 16 18 18  '$ Height:      Weight: 72.7 kg (160 lb 4.4 oz)     SpO2:  98%  95%   Weight change: -0.4 kg (-14.1 oz)  Physical Exam: General: alert , nad , calm   Heart: RRR 1/6 sem , no rub, no gal Lungs: CTA  Abdomen: bs pos, soft , NT, ND Extremities: no pedal edema  Dialysis Access: R IJ perm cath    Problem/Plan: 1 Renal failure - w known myeloma and progressive renal failure last 5 mos.  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 2nd HD today. SW consult ordered for insurance evaluation for new dialysis patient. Start CLIP process on Monday also.  [name] MD [facility] pager [phone]    cell [phone] 07/04/2015, 12:40 PM    Labs: Basic Metabolic Panel:  Recent Labs Lab 07/01/15 0327 07/02/15 0511 07/03/15 1842  NA 133* 132* 135  K 3.6 4.6 5.2*  CL 103 102 105  CO2 16* 15* 14*  GLUCOSE 100* 154* 93  BUN 91* 101* 134*  CREATININE 4.74* 6.34* 7.56*  CALCIUM 8.2* 7.7* 7.7*   Liver Function Tests:  Recent Labs Lab 06/28/15 0405  AST 14*  ALT 14*  ALKPHOS 131*  BILITOT 0.9  PROT 6.5  ALBUMIN 1.6*    CBC:  Recent  Labs Lab 06/29/15 0311  06/30/15 0108  07/01/15 0327 07/02/15 0511 07/03/15 0859  WBC 17.4*  --  16.3*  --  18.1* 17.4* 15.0*  NEUTROABS 13.4*  --  12.2*  --  12.5* 14.3* 12.4*  HGB 7.0*  < > 6.8*  < > 7.8* 8.6* 7.7*  HCT 21.7*  < > 21.1*  < > 23.8* 26.3* 24.3*  MCV 96.4  --  93.8  --  94.1 93.9 95.3  PLT 434*  --  413*  --  454* 507* 432*  < > = values in this interval not displayed. Medications:   . acyclovir  400 mg Oral QHS  .  ceFAZolin (ANCEF) IV  2 g Intravenous to XRAY  . dexamethasone  20 mg Oral Daily  . feeding supplement (ENSURE ENLIVE)  237 mL Oral TID BM  . hydrALAZINE  25 mg Oral Q8H  . lenalidomide  10 mg Oral Q48H  . metoprolol tartrate  50 mg Oral BID  . mirtazapine  15 mg Oral QHS  . morphine  30 mg Oral Q12H  . polyethylene glycol  17 g Oral Daily  . senna-docusate  2 tablet Oral  BID

## 2015-07-04 NOTE — Progress Notes (Signed)
Charles [name]   DOB:07-14-1975   DV#:[id number]   [id number]  Subjective: Charles [name] is  Feeling better. Yesterday was "a difficult day," with many things going on. Today is "better." Charles [name] tolerated HD catheter placement well and does not report SE from HD. Pain is well-controlled on current narcotics and Charles [name] had a "good" BM last night. Wife in room  Objective:  Charles [name] examined in bed Filed Vitals:   07/04/15 0547 07/04/15 0933  BP: 131/74 123/63  Pulse: 97 97  Temp: 97.7 F (36.5 C) 98.6 F (37 C)  Resp: 18 18    Body mass index is 30.3 kg/(m^2).  Intake/Output Summary (Last 24 hours) at 07/04/15 1000 Last data filed at 07/04/15 0900  Gross per 24 hour  Intake    460 ml  Output   2500 ml  Net  -2040 ml   Sclerae unicteric, EOMs intact No cervical or supraclavicular adenopathy Lungs no rales or rhonchi--auscultated anterolaterally Heart regular rate and rhythm Abd soft, nontender, positive bowel sounds MSK HD catheter bandaged over in upper R anterior chest Neuro: nonfocal, well oriented, appropriate affect    CBG (last 3)  No results for input(s): GLUCAP in the last 72 hours.   Labs:  Lab Results  Component Value Date   WBC 15.0* 07/03/2015   HGB 7.7* 07/03/2015   HCT 24.3* 07/03/2015   MCV 95.3 07/03/2015   PLT 432* 07/03/2015   NEUTROABS 12.4* 07/03/2015    @LASTCHEMISTRY @  Urine Studies No results for input(s): UHGB, CRYS in the last 72 hours.  Invalid input(s): UACOL, UAPR, USPG, UPH, UTP, UGL, UKET, UBIL, UNIT, UROB, [place], UEPI, UWBC, [place] Heck [place], [place]  Basic Metabolic Panel:  Recent Labs Lab 06/29/15 0311 06/30/15 0108 07/01/15 0327 07/02/15 0511 07/03/15 1842  NA 137 134* 133* 132* 135  K 3.3* 3.5 3.6 4.6 5.2*  CL 109 103 103 102 105  CO2 18* 19* 16* 15* 14*  GLUCOSE 123* 146* 100* 154* 93  BUN 69* 79* 91* 101* 134*  CREATININE 3.53* 4.05* 4.74* 6.34* 7.56*  CALCIUM 8.3* 8.4* 8.2* 7.7* 7.7*   Results  for Charles [name] (MRN [id number]) as of 07/04/2015 10:14  Ref. Range 06/29/2015 03:11 06/30/2015 01:08 07/01/2015 03:27 07/02/2015 05:11 07/03/2015 18:42  Creatinine Latest Ref Range: 0.61-1.24 mg/dL 3.53 (H) 4.05 (H) 4.74 (H) 6.34 (H) 7.56 (H)        Ref Range 7d ago  78moago  687mogo     Kappa free light chain 3.30 - 19.40 mg/L 145.75 (H) 12.40 (H)R 10.50 (H)R   Comments: (NOTE)          GFR Estimated Creatinine Clearance: 11.1 mL/min (by C-G formula based on Cr of 7.56). Liver Function Tests:  Recent Labs Lab 06/28/15 0405  AST 14*  ALT 14*  ALKPHOS 131*  BILITOT 0.9  PROT 6.5  ALBUMIN 1.6*   No results for input(s): LIPASE, AMYLASE in the last 168 hours. No results for input(s): AMMONIA in the last 168 hours. Coagulation profile  Recent Labs Lab 07/03/15 0859  INR 1.34    CBC:  Recent Labs Lab 06/29/15 0311  06/30/15 0108 06/30/15 1023 07/01/15 0327 07/02/15 0511 07/03/15 0859  WBC 17.4*  --  16.3*  --  18.1* 17.4* 15.0*  NEUTROABS 13.4*  --  12.2*  --  12.5* 14.3* 12.4*  HGB 7.0*  < > 6.8* 9.5* 7.8* 8.6* 7.7*  HCT 21.7*  < > 21.1* 28.7* 23.8* 26.3* 24.3*  MCV 96.4  --  93.8  --  94.1 93.9 95.3  PLT 434*  --  413*  --  454* 507* 432*  < > = values in this interval not displayed. Cardiac Enzymes: No results for input(s): CKTOTAL, CKMB, CKMBINDEX, TROPONINI in the last 168 hours. BNP: Invalid input(s): POCBNP CBG: No results for input(s): GLUCAP in the last 168 hours. D-Dimer No results for input(s): DDIMER in the last 72 hours. Hgb A1c No results for input(s): HGBA1C in the last 72 hours. Lipid Profile No results for input(s): CHOL, HDL, LDLCALC, TRIG, CHOLHDL, LDLDIRECT in the last 72 hours. Thyroid function studies No results for input(s): TSH, T4TOTAL, T3FREE, THYROIDAB in the last 72 hours.  Invalid input(s): FREET3 Anemia work up No results for input(s): VITAMINB12, FOLATE, FERRITIN, TIBC, IRON, RETICCTPCT in the last 72  hours. Microbiology Recent Results (from the past 240 hour(s))  Culture, blood (routine x 2) Call MD if unable to obtain prior to antibiotics being given     Status: None   Collection Time: 06/25/15  5:05 PM  Result Value Ref Range Status   Specimen Description BLOOD RIGHT ARM  Final   Special Requests BOTTLES DRAWN AEROBIC AND ANAEROBIC 8 CC EA  Final   Culture   Final    NO GROWTH 5 DAYS Performed at [facility]    Report Status 06/30/2015 FINAL  Final  Culture, blood (routine x 2) Call MD if unable to obtain prior to antibiotics being given     Status: None   Collection Time: 06/25/15  5:08 PM  Result Value Ref Range Status   Specimen Description BLOOD RIGHT HAND  Final   Special Requests BOTTLES DRAWN AEROBIC AND ANAEROBIC 10 CC EA  Final   Culture   Final    NO GROWTH 5 DAYS Performed at [facility]    Report Status 06/30/2015 FINAL  Final  Urine culture     Status: None   Collection Time: 06/25/15  9:17 PM  Result Value Ref Range Status   Specimen Description URINE, CLEAN CATCH  Final   Special Requests NONE  Final   Culture NO GROWTH Performed at [facility]   Final   Report Status 06/27/2015 FINAL  Final  MRSA PCR Screening     Status: None   Collection Time: 06/26/15  5:00 PM  Result Value Ref Range Status   MRSA by PCR NEGATIVE NEGATIVE Final    Comment:        The GeneXpert MRSA Assay (FDA approved for NASAL specimens only), is one component of a comprehensive MRSA colonization surveillance program. It is not intended to diagnose MRSA infection nor to guide or monitor treatment for MRSA infections.       Studies:  Ir Fluoro Guide Cv Line Right  07/03/2015  INDICATION: 40 year old male with a history of renal failure. Charles [name] has been referred for hemodialysis catheter placement. EXAM: TUNNELED CENTRAL VENOUS HEMODIALYSIS CATHETER PLACEMENT WITH ULTRASOUND AND FLUOROSCOPIC GUIDANCE MEDICATIONS: 2.0 g Ancef . The antibiotic was given in  an appropriate time interval prior to skin puncture. ANESTHESIA/SEDATION: Moderate (conscious) sedation was employed during this procedure. A total of Versed 1.5 mg and Fentanyl 25 mcg was administered intravenously. Moderate Sedation Time: 15 minutes. The patient's level of consciousness and vital signs were monitored continuously by radiology nursing throughout the procedure under my direct supervision. FLUOROSCOPY TIME:  Fluoroscopy Time: 0 minutes 6 seconds (2 mGy). COMPLICATIONS: None PROCEDURE: Informed written consent was obtained from the patient after a discussion of  the risks, benefits, and alternatives to treatment. Questions regarding the procedure were encouraged and answered. The right neck and chest were prepped with chlorhexidine in a sterile fashion, and a sterile drape was applied covering the operative field. Maximum barrier sterile technique with sterile gowns and gloves were used for the procedure. A timeout was performed prior to the initiation of the procedure. After creating a small venotomy incision, a micropuncture kit was utilized to access the right internal jugular vein under direct, real-time ultrasound guidance after the overlying soft tissues were anesthetized with 1% lidocaine with epinephrine. Ultrasound image documentation was performed. The microwire was marked to measure appropriate internal catheter length. External tunneled length was estimated. A total tip to cuff length of 19 cm was selected. Skin and subcutaneous tissues of chest wall below the clavicle were generously infiltrated with 1% lidocaine for local anesthesia. A small stab incision was made with 11 blade scalpel. The selected hemodialysis catheter was tunneled in a retrograde fashion from the anterior chest wall to the venotomy incision. A guidewire was advanced to the level of the IVC and the micropuncture sheath was exchanged for a peel-away sheath. The catheter was then placed through the peel-away sheath with  tips ultimately positioned within the superior aspect of the right atrium. Final catheter positioning was confirmed and documented with a spot radiographic image. The catheter aspirates and flushes normally. The catheter was flushed with appropriate volume heparin dwells. The catheter exit site was secured with a 0-Prolene retention suture. The venotomy incision was closed Derma bond and sterile dressing. Dressings were applied at the chest wall. Patient tolerated the procedure well and remained hemodynamically stable throughout. No complications were encountered and no significant blood loss encountered. IMPRESSION: Status post placement of right IJ approach hemodialysis catheter measuring 19 cm tip to cuff. Catheter ready for use. Signed, Dulcy Fanny. Earleen Newport, DO [facility] Electronically Signed   By: Corrie Mckusick D.O.   On: 07/03/2015 18:01   Ir US Guide Vasc Access Right  07/03/2015  INDICATION: 40 year old male with a history of renal failure. Charles [name] has been referred for hemodialysis catheter placement. EXAM: TUNNELED CENTRAL VENOUS HEMODIALYSIS CATHETER PLACEMENT WITH ULTRASOUND AND FLUOROSCOPIC GUIDANCE MEDICATIONS: 2.0 g Ancef . The antibiotic was given in an appropriate time interval prior to skin puncture. ANESTHESIA/SEDATION: Moderate (conscious) sedation was employed during this procedure. A total of Versed 1.5 mg and Fentanyl 25 mcg was administered intravenously. Moderate Sedation Time: 15 minutes. The patient's level of consciousness and vital signs were monitored continuously by radiology nursing throughout the procedure under my direct supervision. FLUOROSCOPY TIME:  Fluoroscopy Time: 0 minutes 6 seconds (2 mGy). COMPLICATIONS: None PROCEDURE: Informed written consent was obtained from the patient after a discussion of the risks, benefits, and alternatives to treatment. Questions regarding the procedure were encouraged and answered. The right  neck and chest were prepped with chlorhexidine in a sterile fashion, and a sterile drape was applied covering the operative field. Maximum barrier sterile technique with sterile gowns and gloves were used for the procedure. A timeout was performed prior to the initiation of the procedure. After creating a small venotomy incision, a micropuncture kit was utilized to access the right internal jugular vein under direct, real-time ultrasound guidance after the overlying soft tissues were anesthetized with 1% lidocaine with epinephrine. Ultrasound image documentation was performed. The microwire was marked to measure appropriate internal catheter length. External tunneled length was estimated. A total tip to cuff length of 19 cm  was selected. Skin and subcutaneous tissues of chest wall below the clavicle were generously infiltrated with 1% lidocaine for local anesthesia. A small stab incision was made with 11 blade scalpel. The selected hemodialysis catheter was tunneled in a retrograde fashion from the anterior chest wall to the venotomy incision. A guidewire was advanced to the level of the IVC and the micropuncture sheath was exchanged for a peel-away sheath. The catheter was then placed through the peel-away sheath with tips ultimately positioned within the superior aspect of the right atrium. Final catheter positioning was confirmed and documented with a spot radiographic image. The catheter aspirates and flushes normally. The catheter was flushed with appropriate volume heparin dwells. The catheter exit site was secured with a 0-Prolene retention suture. The venotomy incision was closed Derma bond and sterile dressing. Dressings were applied at the chest wall. Patient tolerated the procedure well and remained hemodynamically stable throughout. No complications were encountered and no significant blood loss encountered. IMPRESSION: Status post placement of right IJ approach hemodialysis catheter measuring 19 cm tip  to cuff. Catheter ready for use. Signed, Dulcy Fanny. Earleen Newport, DO [facility] Electronically Signed   By: Corrie Mckusick D.O.   On: 07/03/2015 18:01    Assessment: 40 y.o. [place] [name] presenting January 2016 with bony pain, multiple lytic lesions, and monoclonal IgG kappa paraprotein in serum (2.22 g/dL) and urine (145 mg/dL), bone marrow biopsy 02/03/2014 showing an average 12% plasmacytosis, with some sheets of abnormal plasma cells noted, cytogenetics pending; baseline beta 2 microglobulin was 7.36, baseline creatinine 1.64, with GFR 51, calcium on general 05/21/2014 was 10.8, LDH was normal  (1) Multiple myeloma stage IIA diagnosed January 2016;   I: CyBorD started January 2016 (a) dexamethasone 20 mg/d every TU/WED started 02/04/2014 (b) bortezomib sQ days 1,4,8,11 of every 21 day cycle started 02/10/2014 (c) cyclophosphamide 300 mg/M2 IV weekly started 02/13/2014   2: Lenalidomide 57m daily started 07/26/2014, discontinued December 2016 with progression  (2) chronic kidney disease stage III--worsening creatinine; renal following  Results for JDEANDRE, BRANNAN(MRN [id number] as of 07/01/2015 08:16  Ref. Range 06/27/2015 03:28 06/28/2015 04:05 06/29/2015 03:11 06/30/2015 01:08 07/01/2015 03:27  Creatinine Latest Ref Range: 0.61-1.24 mg/dL 3.73 (H) 3.84 (H) 3.53 (H) 4.05 (H) 4.74 (H)    (3) hypercalcemia: zolendronate started 02/10/2014-- repeat every 4 weeks initially, then every 12 weeks (a) changed to denosumab/Xgeva starting 02/26/2015 because of CKD, repeated every 12 weeks  (4) ID prophylaxis: (a) influenza and Pneumovax [PV13] vaccines 02/07/2014 (b) acyclovir started 02/07/2014 (c) HIV negative 01/31/2014 (d) Pneumovax P23 too be given after March 2016  (5). The patient is not a transplant candidate for financial  reasons  (6) bilateral foot rash: Started on Septra DS and Diflucan 09/03/2014, resolved  (7) Multiple Myeloma relapse in December 2016.  (a) starting 02/05/15: dexamethasone 257mx2 consecutive days weekly; bortezomib sQ weekly,  (b) Septra DS twice daily (c) acyclovir 40073maily  (d) bortezomib/ dexamethasone discontinued after 05/06/2015 dose with evidence of progression  (8) started carfilzomib 05/28/2015, given with weekly low dose dexamethasone (20 mg po twice a week) (a) tolerated cyclophosphamide poorly, discontinued after cycle 1 (b) carfilzomib started 05/28/2015, first cycle only 4 doses (c) lenalidomide added 07/01/2015 at 10 mg po QOD  (d) cycle 2 carfilzomib started 07/01/2015  (9) palliative radiation: to areas of painful bone involvement and spinal cord impingement, in process  (10) cancer-related pain: currently on MSContin 30 mg BID with hydrocodone/APAP for breakthrough pain  (  a) bowel prophylaxis with miraLAX, docusate  (11) progressive renal failure: HD stated 07/03/2015    Plan:  I discussed the overall situation with Charles [name] and his wife. They understand the reason Charles [name] needs HD is that his kidneys are not working and the reason for that is that his cancer is worse. I am hopeful as we continue the current treatment we may see a response, and his renal function may improve. However it is possible Charles [name] will need HD long-term.  His pain and bowel prophylaxis are working well. I encouraged him to get OOBTC and to ambulate in halls.  I will dose his dexamethasone on non-HD days as Charles [name] gets solumedrol with HD  Charles [name] is scheduled to resume radiation 6/5 and his next carfilzomib dose is scheduled for 6/6. His radiation and myeloma treatments can be done outpatient.   Greatly appreciate your help to Charles [name]. Will follow with you.    [name], MD 07/04/2015  10:00 AM Medical  Oncology and Hematology [facility] [address] [place], [place] [address] Tel. [phone]    Fax. [phone]

## 2015-07-04 NOTE — Progress Notes (Signed)
Patient ID: Charles [name], male   DOB: 1975-09-30, 40 y.o.   MRN: [id number]                                                                PROGRESS NOTE                                                                                                                                                                                                             Patient Demographics:    Charles [name], is a 40 y.o. male, DOB - 1975-07-02, SWN:[id number]  Admit date - 06/23/2015   Admitting Physician Domenic Polite, MD  Outpatient Primary MD for the patient is [name], MD  LOS - 11  Outpatient Specialists:   Chief Complaint  Patient presents with  . Emesis       Brief Narrative  Charles [name] is a 40 y.o. male with medical history significant of Multiple Myeloma, CKD 3, back pain, currently undergoing chemotherapy, reports he was started on a new chemotherapeutic agent recently. Patient admitted with Nausea, vomiting, diarrhea. Thought to be to chemo related. Subsequently he developed Press syndrome , Malignant HTN, visual disturbabce and ?PNA. Neurology consulted. BP better. He spike fevers, chest x ray consistent with ?PNA. He was started on Vancomycin and cefepime. Vancomycin stopped 5-30 due to worsening renal function. Nephrology consulted. Also spike fever 5-29 after several days of broad spectrum abx, ID consulted. Fever is deemed to be likely tumor fever, patient is off abx for now Mri spine showed MM progression and cord involvement, oncology has contacted IR. Worsening of renal function, nephrology recommended patient to be [facility].   Subjective:    Charles [name] today feeling better.  Afebrile overnite.  Going on HD due to worsening renal insufficiency.  Denies fever, chills, cp, palp, sob, lower ext edema.     Assessment  & Plan :    Principal Problem:   AKI (acute kidney injury) ([place]) Active Problems:  Multiple myeloma (HCC)   CKD (chronic kidney disease) stage 3, GFR 30-59 ml/min   Nausea with vomiting   Blurry vision, bilateral   HTN (hypertension), malignant   PRES (posterior reversible encephalopathy syndrome)   FUO (fever of unknown origin)   Back pain   Multiple myeloma  not having achieved remission ([place])   Multiple myeloma in relapse (HCC)   Symptomatic anemia   HTN Emergency, Pres syndrome; (? side effect from protesome inhibitor? Please see oncology and renal notes) Patient report vision changes the morning 5-26, MRI consistent with Pres.  neurology consulted recommended repeat MRI in 1 or 2 weeks from 5/26. off nicardipine Gtt on 5/31, Patient was transitioned to Hydralazine 40m tid and and metoprolol 768mbid  On 6/1, sbp in low 100's to 110's, decrease hydralazine to 25tid and lopressor to 5014mid on 6/1, continue titrate bp meds to achieve goal. SBP goal 120--140 Per neurology  Fevers;  chest x ray ? PNA. Uti? Urine multiples bacteria, repeated urine culture no growth to date.  Received cefepime and vancomycin for 5 days.  WBC fluctuates. Still Spike fever 5-30 ID consulted due to persistent fevers. Pan CT unrevealing, MRI spine no infection  Fever possible from multiple myeloma, ID recommends observe off abx, and signed off  AK[place]) [place] CKD 3, baseline is 3.3 - US Koreagative for hydronephrosis.  -cr increasing, Renal consulted who recommended transfer to [place] for dialysis, patient placement notified.   Multiple myeloma ([place] relapse,  --restarted treatment with carfizomib, lenalidomide, dexamethasone, on 5/31, continue acyclovir prophylaxis. Prn pain control -mri spine with cord compression, oncology talked to rad onc -Oncology Dr MagJana Hakimput appreciated: receieving XRT with daily (M-F) treatments currently scheduled through 6/14   readded dexamethasone today per pharmacy   Nausea with vomiting on  admission -could be from chemo, ? Infection, Ct ab no obstruction. -seems resolved   Anemia; secondary to MM;.  Received one unit 5-30. Keep hgb> 7.   Thyroid nodules: can follow up as oupatient  Back pain: stable, improved  DVT prophylaxis: stopped lovenox,  Due to renal insufficiency,  Will start on heparin [place] tomorrow    Code Status : Full  Family Communication  : w/ patient  Disposition Plan  : home  Barriers For Discharge :   Consults  :  Oncology, nephrology  Procedures  : HD  DVT Prophylaxis  :  SCDs   Lab Results  Component Value Date   PLT 432* 07/03/2015    Antibiotics  :  Off currently.   Anti-infectives    Start     Dose/Rate Route Frequency Ordered Stop   07/03/15 2200  acyclovir (ZOVIRAX) tablet 400 mg     400 mg Oral Daily at bedtime 07/03/15 0933     07/03/15 1317  ceFAZolin (ANCEF) 2-3 GM-% IVPB SOLR    Comments:  Dhers, Patricia   : cabinet override      07/03/15 1317 07/03/15 1327   07/03/15 1200  ceFAZolin (ANCEF) IVPB 2g/100 mL premix     2 g 200 mL/hr over 30 Minutes Intravenous To Radiology 07/03/15 1153 07/04/15 1200   06/30/15 1600  vancomycin (VANCOCIN) IVPB 750 mg/150 ml premix  Status:  Discontinued     750 mg 150 mL/hr over 60 Minutes Intravenous Every 24 hours 06/29/15 1742 06/30/15 0642   06/27/15 1000  acyclovir (ZOVIRAX) tablet 400 mg  Status:  Discontinued     400 mg Oral Daily 06/27/15 0502 07/03/15 0933   06/25/15 1800  ceFEPIme (MAXIPIME) 1 g in dextrose 5 % 50 mL IVPB  Status:  Discontinued     1 g 100 mL/hr over 30 Minutes Intravenous Every 24 hours 06/25/15 1604 06/30/15 2221   06/25/15 1700  vancomycin (VANCOCIN) IVPB 1000 mg/200 mL premix  Status:  Discontinued     1,000 mg 200 mL/hr over 60 Minutes Intravenous Every 48 hours 06/25/15 1613 06/29/15 1742   06/24/15 1000  acyclovir (ZOVIRAX) 200 MG capsule 400 mg  Status:  Discontinued     400 mg Oral Daily 06/23/15 1858 06/27/15 0502   06/24/15 0800  cefTRIAXone  (ROCEPHIN) 1 g in dextrose 5 % 50 mL IVPB  Status:  Discontinued     1 g 100 mL/hr over 30 Minutes Intravenous Every 24 hours 06/23/15 1858 06/25/15 1603   06/23/15 1615  cefTRIAXone (ROCEPHIN) 1 g in dextrose 5 % 50 mL IVPB     1 g 100 mL/hr over 30 Minutes Intravenous  Once 06/23/15 1602 06/23/15 1638        Objective:   Filed Vitals:   07/03/15 2320 07/04/15 0001 07/04/15 0547 07/04/15 0933  BP: 136/84 123/62 131/74 123/63  Pulse: 96 89 97 97  Temp: 97.5 F (36.4 C) 97.6 F (36.4 C) 97.7 F (36.5 C) 98.6 F (37 C)  TempSrc: Oral Oral Oral Oral  Resp: 15 16 18 18   Height:      Weight: 72.7 kg (160 lb 4.4 oz)     SpO2:  98%  95%    Wt Readings from Last 3 Encounters:  07/03/15 72.7 kg (160 lb 4.4 oz)  06/26/15 67.132 kg (148 lb)  06/11/15 67.223 kg (148 lb 3.2 oz)     Intake/Output Summary (Last 24 hours) at 07/04/15 1037 Last data filed at 07/04/15 0900  Gross per 24 hour  Intake    460 ml  Output   2500 ml  Net  -2040 ml     Physical Exam  Awake Alert, Oriented X 3, No new F.N deficits, Normal affect [place].AT,PERRAL Supple Neck,No JVD, No cervical lymphadenopathy appriciated.  Symmetrical Chest wall movement, Good air movement bilaterally, CTAB RRR,No Gallops,Rubs or new Murmurs, No Parasternal Heave +ve B.Sounds, Abd Soft, No tenderness, No organomegaly appriciated, No rebound - guarding or rigidity. No Cyanosis, Clubbing or edema, No new Rash or bruise     Data Review:    CBC  Recent Labs Lab 06/29/15 0311  06/30/15 0108 06/30/15 1023 07/01/15 0327 07/02/15 0511 07/03/15 0859  WBC 17.4*  --  16.3*  --  18.1* 17.4* 15.0*  HGB 7.0*  < > 6.8* 9.5* 7.8* 8.6* 7.7*  HCT 21.7*  < > 21.1* 28.7* 23.8* 26.3* 24.3*  PLT 434*  --  413*  --  454* 507* 432*  MCV 96.4  --  93.8  --  94.1 93.9 95.3  MCH 31.1  --  30.2  --  30.8 30.7 30.2  MCHC 32.3  --  32.2  --  32.8 32.7 31.7  RDW 15.1  --  14.8  --  15.1 15.0 15.1  LYMPHSABS 2.1  --  2.1  --  2.7 1.2 1.3   MONOABS 1.7*  --  2.0*  --  2.7* 1.9* 1.4*  EOSABS 0.2  --  0.0  --  0.2 0.0 0.0  BASOSABS 0.0  --  0.0  --  0.0 0.0 0.0  < > = values in this interval not displayed.  Chemistries   Recent Labs Lab 06/28/15 0405 06/29/15 0311 06/30/15 0108 07/01/15 0327 07/02/15 0511 07/03/15 1842  NA 134* 137 134* 133* 132* 135  K 3.3* 3.3* 3.5 3.6 4.6 5.2*  CL 103 109 103 103 102 105  CO2 20* 18* 19* 16* 15* 14*  GLUCOSE 107* 123* 146* 100* 154*  93  BUN 65* 69* 79* 91* 101* 134*  CREATININE 3.84* 3.53* 4.05* 4.74* 6.34* 7.56*  CALCIUM 8.5* 8.3* 8.4* 8.2* 7.7* 7.7*  AST 14*  --   --   --   --   --   ALT 14*  --   --   --   --   --   ALKPHOS 131*  --   --   --   --   --   BILITOT 0.9  --   --   --   --   --    ------------------------------------------------------------------------------------------------------------------ No results for input(s): CHOL, HDL, LDLCALC, TRIG, CHOLHDL, LDLDIRECT in the last 72 hours.  No results found for: HGBA1C ------------------------------------------------------------------------------------------------------------------ No results for input(s): TSH, T4TOTAL, T3FREE, THYROIDAB in the last 72 hours.  Invalid input(s): FREET3 ------------------------------------------------------------------------------------------------------------------ No results for input(s): VITAMINB12, FOLATE, FERRITIN, TIBC, IRON, RETICCTPCT in the last 72 hours.  Coagulation profile  Recent Labs Lab 07/03/15 0859  INR 1.34    No results for input(s): DDIMER in the last 72 hours.  Cardiac Enzymes No results for input(s): CKMB, TROPONINI, MYOGLOBIN in the last 168 hours.  Invalid input(s): CK ------------------------------------------------------------------------------------------------------------------ No results found for: BNP  Inpatient Medications  Scheduled Meds: . acyclovir  400 mg Oral QHS  .  ceFAZolin (ANCEF) IV  2 g Intravenous to XRAY  . dexamethasone   20 mg Oral Daily  . feeding supplement (ENSURE ENLIVE)  237 mL Oral TID BM  . hydrALAZINE  25 mg Oral Q8H  . lenalidomide  10 mg Oral Q48H  . metoprolol tartrate  50 mg Oral BID  . mirtazapine  15 mg Oral QHS  . morphine  30 mg Oral Q12H  . polyethylene glycol  17 g Oral Daily  . senna-docusate  2 tablet Oral BID   Continuous Infusions:  PRN Meds:.sodium chloride, acetaminophen, albuterol, albuterol, diatrizoate meglumine-sodium, diphenhydrAMINE, diphenhydrAMINE, diphenhydrAMINE, diphenhydrAMINE, EPINEPHrine, EPINEPHrine, EPINEPHrine, EPINEPHrine, EPINEPHrine, EPINEPHrine, EPINEPHrine, EPINEPHrine, famotidine, heparin lock flush, heparin lock flush, heparin lock flush, heparin lock flush, heparin lock flush, heparin lock flush, HYDROcodone-acetaminophen, methylPREDNISolone sodium succinate, methylPREDNISolone sodium succinate, prochlorperazine, sodium chloride flush, sodium chloride flush, sodium chloride flush, sodium chloride flush  Micro Results Recent Results (from the past 240 hour(s))  Culture, blood (routine x 2) Call MD if unable to obtain prior to antibiotics being given     Status: None   Collection Time: 06/25/15  5:05 PM  Result Value Ref Range Status   Specimen Description BLOOD RIGHT ARM  Final   Special Requests BOTTLES DRAWN AEROBIC AND ANAEROBIC 8 CC EA  Final   Culture   Final    NO GROWTH 5 DAYS Performed at [facility]    Report Status 06/30/2015 FINAL  Final  Culture, blood (routine x 2) Call MD if unable to obtain prior to antibiotics being given     Status: None   Collection Time: 06/25/15  5:08 PM  Result Value Ref Range Status   Specimen Description BLOOD RIGHT HAND  Final   Special Requests BOTTLES DRAWN AEROBIC AND ANAEROBIC 10 CC EA  Final   Culture   Final    NO GROWTH 5 DAYS Performed at [facility]    Report Status 06/30/2015 FINAL  Final  Urine culture     Status: None   Collection Time: 06/25/15  9:17 PM  Result Value Ref Range  Status   Specimen Description URINE, CLEAN CATCH  Final   Special Requests NONE  Final   Culture  NO GROWTH Performed at [facility]   Final   Report Status 06/27/2015 FINAL  Final  MRSA PCR Screening     Status: None   Collection Time: 06/26/15  5:00 PM  Result Value Ref Range Status   MRSA by PCR NEGATIVE NEGATIVE Final    Comment:        The GeneXpert MRSA Assay (FDA approved for NASAL specimens only), is one component of a comprehensive MRSA colonization surveillance program. It is not intended to diagnose MRSA infection nor to guide or monitor treatment for MRSA infections.     Radiology Reports Ct Abdomen Pelvis Wo Contrast  06/30/2015  CLINICAL DATA:  History of multiple myeloma with chronic kidney disease stage 3 and back pain currently undergoing chemotherapy. Nausea, vomiting, diarrhea, fever and chills four days. EXAM: CT CHEST, ABDOMEN AND PELVIS WITHOUT CONTRAST TECHNIQUE: Multidetector CT imaging of the chest, abdomen and pelvis was performed following the standard protocol without IV contrast. COMPARISON:  CT 06/23/2015, 02/03/2014 and 01/30/2014 FINDINGS: CT CHEST Lungs are adequately inflated demonstrate a small amount of bilateral pleural fluid left greater than right with associated atelectasis in the posterior lung bases left greater than right. There are new multiple bilateral well-defined focal pleural masses right greater than left with the largest over the right apex measuring 2.8 x 5.2 cm. These likely represent spread of patient's known multiple myeloma. Airways are normal. Heart is normal size. No definite mediastinal or hilar adenopathy. Small amount contrast within the mid to distal esophagus likely due to dysmotility versus reflux. Remaining mediastinal structures are unremarkable. There are couple hypodense thyroid nodules with the largest measuring 1.2 cm over the left lobe. Patient's known destructive sternal manubrial lesion is unchanged. There is  subtle increased lucency over the body of the sternum which is new likely spread of patient's known myeloma. There are several lucencies over the thoracic spine with the most prominent over the T9 vertebral body. CT ABDOMEN AND PELVIS The liver, spleen, gallbladder, pancreas and adrenal glands are within normal. Kidneys normal size without hydronephrosis or nephrolithiasis. Stable exophytic hypodensity over the mid pole cortex of the left kidney likely a cyst. Ureters are normal. Stomach is within normal. Small bowel is normal. Appendix is normal. Subtle diverticulosis of the colon most prominent over the right colon. There is no significant free fluid or focal inflammatory change. Few small periaortic lymph nodes. Vascular structures are unremarkable. Pelvic images demonstrate the bladder, prostate and rectum to be within normal. Ill-defined low-attenuation over the right iliac bone likely a myelomatous lesion. Ill-defined lytic process over the right femoral neck spanning nearly the entire width of the femoral neck increasing risk of pathologic fracture. Bone island over the left sacrum. IMPRESSION: No acute findings in the chest, abdomen or pelvis. Lytic lesions within the sternum, spine, right iliac bone and right femoral neck compatible with patient's known myeloma. Note that patient is at risk for pathologic fracture through the right femoral neck. Multiple new bilateral focal pleural masses with the largest over the right apex measuring 2.8 x 5.2 cm likely metastatic disease from patient's multiple myeloma. Small amount of bilateral pleural fluid bibasilar consolidation most typical of atelectasis. Minimal diverticulosis of the colon. Stable 1.3 cm exophytic hypodensity over the mid pole left kidney likely a cyst. Couple small hypodense thyroid nodules with the larger over the left lobe measuring 1.2 cm. Recommend follow-up as clinically indicated. Electronically Signed   By: Marin Olp M.D.   On:  06/30/2015 17:24  Dg Chest 2 View  06/25/2015  CLINICAL DATA:  Pt complains of Nausea, Vomiting, diarrhea, fever and chills x 4 days. Hx of multiple myeloma and on chemo. EXAM: CHEST  2 VIEW COMPARISON:  Multiple prior studies including 01/30/2014 and 2015/02/14 FINDINGS: Shallow lung inflation. The heart is mildly prominent in size. There are small bilateral pleural effusions. Minimal streaky density identified at the lung bases, left greater than right. There is right apical extrapleural soft tissue density. Rounded soft tissue density overlies the right posterior lateral fifth rib and there is possible expansion of the right anterior fifth rib. There is a remote fracture of the right second anterior rib. IMPRESSION: 1. Interval change in the appearance of the chest. 2. Increased soft tissue masses and possible expansion of the right anterior fifth rib. 3. 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 Consider further evaluation with CT of the chest as needed. 4. Bilateral lower lobe atelectasis and/or infiltrates, left greater than right. Electronically Signed   By: Nolon Nations M.D.   On: 06/25/2015 15:58   Ct Chest Wo Contrast  06/30/2015  CLINICAL DATA:  History of multiple myeloma with chronic kidney disease stage 3 and back pain currently undergoing chemotherapy. Nausea, vomiting, diarrhea, fever and chills four days. EXAM: CT CHEST, ABDOMEN AND PELVIS WITHOUT CONTRAST TECHNIQUE: Multidetector CT imaging of the chest, abdomen and pelvis was performed following the standard protocol without IV contrast. COMPARISON:  CT 06/23/2015, 02/03/2014 and 01/30/2014 FINDINGS: CT CHEST Lungs are adequately inflated demonstrate a small amount of bilateral pleural fluid left greater than right with associated atelectasis in the posterior lung bases left greater than right. There are new multiple bilateral well-defined focal pleural masses right greater than left with the largest over the right apex measuring 2.8 x 5.2  cm. These likely represent spread of patient's known multiple myeloma. Airways are normal. Heart is normal size. No definite mediastinal or hilar adenopathy. Small amount contrast within the mid to distal esophagus likely due to dysmotility versus reflux. Remaining mediastinal structures are unremarkable. There are couple hypodense thyroid nodules with the largest measuring 1.2 cm over the left lobe. Patient's known destructive sternal manubrial lesion is unchanged. There is subtle increased lucency over the body of the sternum which is new likely spread of patient's known myeloma. There are several lucencies over the thoracic spine with the most prominent over the T9 vertebral body. CT ABDOMEN AND PELVIS The liver, spleen, gallbladder, pancreas and adrenal glands are within normal. Kidneys normal size without hydronephrosis or nephrolithiasis. Stable exophytic hypodensity over the mid pole cortex of the left kidney likely a cyst. Ureters are normal. Stomach is within normal. Small bowel is normal. Appendix is normal. Subtle diverticulosis of the colon most prominent over the right colon. There is no significant free fluid or focal inflammatory change. Few small periaortic lymph nodes. Vascular structures are unremarkable. Pelvic images demonstrate the bladder, prostate and rectum to be within normal. Ill-defined low-attenuation over the right iliac bone likely a myelomatous lesion. Ill-defined lytic process over the right femoral neck spanning nearly the entire width of the femoral neck increasing risk of pathologic fracture. Bone island over the left sacrum. IMPRESSION: No acute findings in the chest, abdomen or pelvis. Lytic lesions within the sternum, spine, right iliac bone and right femoral neck compatible with patient's known myeloma. Note that patient is at risk for pathologic fracture through the right femoral neck. Multiple new bilateral focal pleural masses with the largest over the right apex measuring  2.8 x 5.2 cm  likely metastatic disease from patient's multiple myeloma. Small amount of bilateral pleural fluid bibasilar consolidation most typical of atelectasis. Minimal diverticulosis of the colon. Stable 1.3 cm exophytic hypodensity over the mid pole left kidney likely a cyst. Couple small hypodense thyroid nodules with the larger over the left lobe measuring 1.2 cm. Recommend follow-up as clinically indicated. Electronically Signed   By: Marin Olp M.D.   On: 06/30/2015 17:24   Mr Brain Wo Contrast  06/26/2015  CLINICAL DATA:  Bilateral blurry vision. Fever. History of multiple myeloma on chemotherapy. Elevated blood pressures. EXAM: MRI HEAD WITHOUT CONTRAST TECHNIQUE: Multiplanar, multiecho pulse sequences of the brain and surrounding structures were obtained without intravenous contrast. COMPARISON:  Head CT 10/11/2010 FINDINGS: There is abnormal T2 hyperintensity involving cortex and subcortical white matter in both occipital lobes and left greater than right parietal lobes. No restricted diffusion or hemorrhage is seen. The brain is otherwise normal in signal aside from scattered, punctate foci of T2 hyperintensity in the cerebral white matter which are nonspecific. No mass, midline shift, or extra-axial fluid collection is seen. Ventricles are normal in size. Orbits are unremarkable. A small amount of fluid is present in the right maxillary sinus. The mastoid air cells are clear. Major intracranial vascular flow voids are preserved. There is a 1.4 cm left frontal skull lesion with extension through the inner and outer tables of the skull and small volume epidural tumor without significant mass effect on the underlying brain. No brain edema. Multiple other, smaller lesions are scattered elsewhere in the skull. Baseline bone marrow signal is diffusely diminished throughout the skull and visualized upper cervical spine and may be related to ongoing treatment. IMPRESSION: 1. Edema in the posterior  cerebral hemispheres bilaterally most compatible with PRES. 2. Multiple skull lesions consistent with known multiple myeloma. 1.4 cm left frontal lesion with mild epidural extension. Electronically Signed   By: Logan Bores M.D.   On: 06/26/2015 12:22   Mr Thoracic Spine Wo Contrast  07/02/2015  CLINICAL DATA:  Multiple myeloma.  Back pain.  Fever EXAM: MRI THORACIC AND LUMBAR SPINE WITHOUT CONTRAST TECHNIQUE: Multiplanar and multiecho pulse sequences of the thoracic and lumbar spine were obtained without intravenous contrast. COMPARISON:  CT chest abdomen and pelvis 06/30/2015 FINDINGS: MRI THORACIC SPINE FINDINGS Alignment:  Normal Vertebrae: Diffuse infiltration of the bone marrow throughout all vertebral bodies consistent with myeloma. This also involves the posterior elements and ribs bilaterally. There is extradural tumor in the spinal canal posteriorly at T4, T7, and T 10-11. Extraosseous mass lesions in the ribs bilaterally consistent with myeloma. Cord: Posterior extradural mass at the T4 level causing mild spinal stenosis. Posterior extradural mass at the T7 level causing moderate spinal stenosis and mild compression of the cord. Cord has normal signal. Mild posterior extradural tumor at T10-11 extending to the right. This is causing right foraminal stenosis. No significant spinal stenosis. Paraspinal and other soft tissues: Paraspinous muscles normal. Small bilateral pleural effusions. Diffuse rib infiltration by mall myeloma with multiple extradural rib lesions. Disc levels: Negative for disc protrusion. No significant disc degeneration. MRI LUMBAR SPINE FINDINGS Segmentation:  Normal.  Lowest disc space L5-S1 Alignment:  Normal Vertebrae: Diffuse infiltration of the bone marrow by myeloma. This involves all vertebral bodies and extends into the posterior elements. There is involvement of the pelvis. Sacrum is diffusely involved. Negative for fracture. Conus medullaris: Extends to the L1 level and  appears normal. Paraspinal and other soft tissues: No retroperitoneal adenopathy. Extraosseous mass from the left  iliac wing displacing iliopsoas anteriorly Disc levels: L1-2:  Negative L2-3:  Negative L3-4:  Bilateral facet degeneration.  Normal disc space L4-5:  Bilateral facet degeneration.  Normal disc space L5-S1: Small central disc protrusion without neural impingement. IMPRESSION: MR THORACIC SPINE IMPRESSION Extensive and diffuse infiltration of the bone marrow with myeloma. No fracture. Posterior extradural mass the T4 level causing mild spinal stenosis. Posterior epidural mass at the T7 level causing moderate spinal stenosis and mild compression of the cord. MR LUMBAR SPINE IMPRESSION Diffuse bone marrow infiltration throughout the lumbar spine, sacrum, and pelvis. No fracture. No tumor is seen in the lumbar spinal canal. No evidence of infection in the thoracic or lumbar spine. Electronically Signed   By: Franchot Gallo M.D.   On: 07/02/2015 09:00   Mr Lumbar Spine Wo Contrast  07/02/2015  CLINICAL DATA:  Multiple myeloma.  Back pain.  Fever EXAM: MRI THORACIC AND LUMBAR SPINE WITHOUT CONTRAST TECHNIQUE: Multiplanar and multiecho pulse sequences of the thoracic and lumbar spine were obtained without intravenous contrast. COMPARISON:  CT chest abdomen and pelvis 06/30/2015 FINDINGS: MRI THORACIC SPINE FINDINGS Alignment:  Normal Vertebrae: Diffuse infiltration of the bone marrow throughout all vertebral bodies consistent with myeloma. This also involves the posterior elements and ribs bilaterally. There is extradural tumor in the spinal canal posteriorly at T4, T7, and T 10-11. Extraosseous mass lesions in the ribs bilaterally consistent with myeloma. Cord: Posterior extradural mass at the T4 level causing mild spinal stenosis. Posterior extradural mass at the T7 level causing moderate spinal stenosis and mild compression of the cord. Cord has normal signal. Mild posterior extradural tumor at T10-11  extending to the right. This is causing right foraminal stenosis. No significant spinal stenosis. Paraspinal and other soft tissues: Paraspinous muscles normal. Small bilateral pleural effusions. Diffuse rib infiltration by mall myeloma with multiple extradural rib lesions. Disc levels: Negative for disc protrusion. No significant disc degeneration. MRI LUMBAR SPINE FINDINGS Segmentation:  Normal.  Lowest disc space L5-S1 Alignment:  Normal Vertebrae: Diffuse infiltration of the bone marrow by myeloma. This involves all vertebral bodies and extends into the posterior elements. There is involvement of the pelvis. Sacrum is diffusely involved. Negative for fracture. Conus medullaris: Extends to the L1 level and appears normal. Paraspinal and other soft tissues: No retroperitoneal adenopathy. Extraosseous mass from the left iliac wing displacing iliopsoas anteriorly Disc levels: L1-2:  Negative L2-3:  Negative L3-4:  Bilateral facet degeneration.  Normal disc space L4-5:  Bilateral facet degeneration.  Normal disc space L5-S1: Small central disc protrusion without neural impingement. IMPRESSION: MR THORACIC SPINE IMPRESSION Extensive and diffuse infiltration of the bone marrow with myeloma. No fracture. Posterior extradural mass the T4 level causing mild spinal stenosis. Posterior epidural mass at the T7 level causing moderate spinal stenosis and mild compression of the cord. MR LUMBAR SPINE IMPRESSION Diffuse bone marrow infiltration throughout the lumbar spine, sacrum, and pelvis. No fracture. No tumor is seen in the lumbar spinal canal. No evidence of infection in the thoracic or lumbar spine. Electronically Signed   By: Franchot Gallo M.D.   On: 07/02/2015 09:00   US Renal Port  06/24/2015  CLINICAL DATA:  Acute kidney injury. EXAM: RENAL / URINARY TRACT ULTRASOUND COMPLETE COMPARISON:  CT scan of Jun 23, 2015. FINDINGS: Right Kidney: Length: 12.1 cm. Mildly increased echogenicity of renal parenchyma is noted.  No mass or hydronephrosis visualized. Left Kidney: Length: 11.9 cm. Mildly increased echogenicity of renal parenchyma is noted. 1.6 cm simple exophytic cyst  is seen arising from midpole. No mass or hydronephrosis visualized. Bladder: Appears normal for degree of bladder distention. IMPRESSION: Mildly increased echogenicity of renal parenchyma is noted bilaterally suggesting medical renal disease. No hydronephrosis or renal obstruction is noted. Electronically Signed   By: Marijo Conception, M.D.   On: 06/24/2015 09:50   Ir Fluoro Guide Cv Line Right  07/03/2015  INDICATION: 40 year old male with a history of renal failure. He has been referred for hemodialysis catheter placement. EXAM: TUNNELED CENTRAL VENOUS HEMODIALYSIS CATHETER PLACEMENT WITH ULTRASOUND AND FLUOROSCOPIC GUIDANCE MEDICATIONS: 2.0 g Ancef . The antibiotic was given in an appropriate time interval prior to skin puncture. ANESTHESIA/SEDATION: Moderate (conscious) sedation was employed during this procedure. A total of Versed 1.5 mg and Fentanyl 25 mcg was administered intravenously. Moderate Sedation Time: 15 minutes. The patient's level of consciousness and vital signs were monitored continuously by radiology nursing throughout the procedure under my direct supervision. FLUOROSCOPY TIME:  Fluoroscopy Time: 0 minutes 6 seconds (2 mGy). COMPLICATIONS: None PROCEDURE: Informed written consent was obtained from the patient after a discussion of the risks, benefits, and alternatives to treatment. Questions regarding the procedure were encouraged and answered. The right neck and chest were prepped with chlorhexidine in a sterile fashion, and a sterile drape was applied covering the operative field. Maximum barrier sterile technique with sterile gowns and gloves were used for the procedure. A timeout was performed prior to the initiation of the procedure. After creating a small venotomy incision, a micropuncture kit was utilized to access the right  internal jugular vein under direct, real-time ultrasound guidance after the overlying soft tissues were anesthetized with 1% lidocaine with epinephrine. Ultrasound image documentation was performed. The microwire was marked to measure appropriate internal catheter length. External tunneled length was estimated. A total tip to cuff length of 19 cm was selected. Skin and subcutaneous tissues of chest wall below the clavicle were generously infiltrated with 1% lidocaine for local anesthesia. A small stab incision was made with 11 blade scalpel. The selected hemodialysis catheter was tunneled in a retrograde fashion from the anterior chest wall to the venotomy incision. A guidewire was advanced to the level of the IVC and the micropuncture sheath was exchanged for a peel-away sheath. The catheter was then placed through the peel-away sheath with tips ultimately positioned within the superior aspect of the right atrium. Final catheter positioning was confirmed and documented with a spot radiographic image. The catheter aspirates and flushes normally. The catheter was flushed with appropriate volume heparin dwells. The catheter exit site was secured with a 0-Prolene retention suture. The venotomy incision was closed Derma bond and sterile dressing. Dressings were applied at the chest wall. Patient tolerated the procedure well and remained hemodynamically stable throughout. No complications were encountered and no significant blood loss encountered. IMPRESSION: Status post placement of right IJ approach hemodialysis catheter measuring 19 cm tip to cuff. Catheter ready for use. Signed, Dulcy Fanny. Earleen Newport, DO [facility] Electronically Signed   By: Corrie Mckusick D.O.   On: 07/03/2015 18:01   Ir US Guide Vasc Access Right  07/03/2015  INDICATION: 40 year old male with a history of renal failure. He has been referred for hemodialysis catheter placement. EXAM: TUNNELED  CENTRAL VENOUS HEMODIALYSIS CATHETER PLACEMENT WITH ULTRASOUND AND FLUOROSCOPIC GUIDANCE MEDICATIONS: 2.0 g Ancef . The antibiotic was given in an appropriate time interval prior to skin puncture. ANESTHESIA/SEDATION: Moderate (conscious) sedation was employed during this procedure. A total of Versed 1.5 mg  and Fentanyl 25 mcg was administered intravenously. Moderate Sedation Time: 15 minutes. The patient's level of consciousness and vital signs were monitored continuously by radiology nursing throughout the procedure under my direct supervision. FLUOROSCOPY TIME:  Fluoroscopy Time: 0 minutes 6 seconds (2 mGy). COMPLICATIONS: None PROCEDURE: Informed written consent was obtained from the patient after a discussion of the risks, benefits, and alternatives to treatment. Questions regarding the procedure were encouraged and answered. The right neck and chest were prepped with chlorhexidine in a sterile fashion, and a sterile drape was applied covering the operative field. Maximum barrier sterile technique with sterile gowns and gloves were used for the procedure. A timeout was performed prior to the initiation of the procedure. After creating a small venotomy incision, a micropuncture kit was utilized to access the right internal jugular vein under direct, real-time ultrasound guidance after the overlying soft tissues were anesthetized with 1% lidocaine with epinephrine. Ultrasound image documentation was performed. The microwire was marked to measure appropriate internal catheter length. External tunneled length was estimated. A total tip to cuff length of 19 cm was selected. Skin and subcutaneous tissues of chest wall below the clavicle were generously infiltrated with 1% lidocaine for local anesthesia. A small stab incision was made with 11 blade scalpel. The selected hemodialysis catheter was tunneled in a retrograde fashion from the anterior chest wall to the venotomy incision. A guidewire was advanced to the  level of the IVC and the micropuncture sheath was exchanged for a peel-away sheath. The catheter was then placed through the peel-away sheath with tips ultimately positioned within the superior aspect of the right atrium. Final catheter positioning was confirmed and documented with a spot radiographic image. The catheter aspirates and flushes normally. The catheter was flushed with appropriate volume heparin dwells. The catheter exit site was secured with a 0-Prolene retention suture. The venotomy incision was closed Derma bond and sterile dressing. Dressings were applied at the chest wall. Patient tolerated the procedure well and remained hemodynamically stable throughout. No complications were encountered and no significant blood loss encountered. IMPRESSION: Status post placement of right IJ approach hemodialysis catheter measuring 19 cm tip to cuff. Catheter ready for use. Signed, Dulcy Fanny. Earleen Newport, DO [facility] Electronically Signed   By: Corrie Mckusick D.O.   On: 07/03/2015 18:01   Ct Renal Stone Study  06/23/2015  CLINICAL DATA:  History multiple myeloma currently on chemotherapy. Chronic kidney disease. Nausea, vomiting and diarrhea 4 days. Generalized abdominal pain with chills and weakness as well as dysuria and dark urine. Mid back pain. EXAM: CT ABDOMEN AND PELVIS WITHOUT CONTRAST TECHNIQUE: Multidetector CT imaging of the abdomen and pelvis was performed following the standard protocol without IV contrast. COMPARISON:  12/28/2014 and 02/03/2014 FINDINGS: Lung bases demonstrate very small bilateral pleural effusions with associated dependent atelectasis. Minimal cardiomegaly. The Abdominal images demonstrate the liver, spleen pancreas, gallbladder and adrenal glands to be within normal. Vascular structures are within normal. Kidneys are normal in size without hydronephrosis or nephrolithiasis. 1.3 cm exophytic cyst over the mid pole of the left  kidney. Ureters are normal. Stomach is normal. Appendix is normal. Small bowel is within normal. There is mild diverticulosis of the colon. Pelvic images demonstrate the bladder, prostate and rectum to be within normal. Stable 1 cm sclerotic focus over the left sacrum likely a bone island. Old right lower anterior rib fracture. Focal lucency along the left side of the T9 vertebral body with subtle patchy low density T7  and T8 not well seen previously and may represent evidence of patient's multiple myeloma. IMPRESSION: No acute findings in the abdomen/ pelvis. Subtle lucencies over the T7, T8 and T9 vertebral bodies not well seen previously and likely due to patient's known multiple myeloma. No compression fracture. Small bilateral pleural effusions with associated basilar atelectasis. Mild diverticulosis of the colon. 1.3 cm left renal cyst. Electronically Signed   By: Marin Olp M.D.   On: 06/23/2015 17:33    Time Spent in minutes  30   Jani Gravel M.D on 07/04/2015 at 10:37 AM  Between 7am to 7pm - Pager - [phone]  After 7pm go to [email or web address] - password [facility]  [facility] -  Office  [phone]

## 2015-07-05 ENCOUNTER — Inpatient Hospital Stay (HOSPITAL_COMMUNITY): Payer: Medicaid Other

## 2015-07-05 DIAGNOSIS — Z4931 Encounter for adequacy testing for hemodialysis: Secondary | ICD-10-CM

## 2015-07-05 LAB — COMPREHENSIVE METABOLIC PANEL
ALK PHOS: 275 U/L — AB (ref 38–126)
ALT: 17 U/L (ref 17–63)
ANION GAP: 13 (ref 5–15)
AST: 16 U/L (ref 15–41)
Albumin: 1.2 g/dL — ABNORMAL LOW (ref 3.5–5.0)
BILIRUBIN TOTAL: 0.5 mg/dL (ref 0.3–1.2)
BUN: 52 mg/dL — ABNORMAL HIGH (ref 6–20)
CALCIUM: 7.7 mg/dL — AB (ref 8.9–10.3)
CO2: 25 mmol/L (ref 22–32)
Chloride: 99 mmol/L — ABNORMAL LOW (ref 101–111)
Creatinine, Ser: 4.66 mg/dL — ABNORMAL HIGH (ref 0.61–1.24)
GFR, EST AFRICAN AMERICAN: 17 mL/min — AB (ref >60)
GFR, EST NON AFRICAN AMERICAN: 14 mL/min — AB (ref >60)
GLUCOSE: 103 mg/dL — AB (ref 65–99)
Potassium: 4.7 mmol/L (ref 3.5–5.1)
Sodium: 137 mmol/L (ref 135–145)
TOTAL PROTEIN: 5.3 g/dL — AB (ref 6.5–8.1)

## 2015-07-05 LAB — CBC
HEMATOCRIT: 23.7 % — AB (ref 39.0–52.0)
HEMOGLOBIN: 7.3 g/dL — AB (ref 13.0–17.0)
MCH: 29.3 pg (ref 26.0–34.0)
MCHC: 30.8 g/dL (ref 30.0–36.0)
MCV: 95.2 fL (ref 78.0–100.0)
Platelets: 335 10*3/uL (ref 150–400)
RBC: 2.49 MIL/uL — ABNORMAL LOW (ref 4.22–5.81)
RDW: 15.1 % (ref 11.5–15.5)
WBC: 8.7 10*3/uL (ref 4.0–10.5)

## 2015-07-05 LAB — HEPATITIS B CORE ANTIBODY, TOTAL: HEP B C TOTAL AB: NEGATIVE

## 2015-07-05 LAB — HEPATITIS B SURFACE ANTIBODY,QUALITATIVE: HEP B S AB: NONREACTIVE

## 2015-07-05 LAB — PARATHYROID HORMONE, INTACT (NO CA): PTH: 74 pg/mL — ABNORMAL HIGH (ref 15–65)

## 2015-07-05 LAB — HEPATITIS B SURFACE ANTIGEN: HEP B S AG: NEGATIVE

## 2015-07-05 MED ORDER — HEPARIN SODIUM (PORCINE) 5000 UNIT/ML IJ SOLN
5000.0000 [IU] | Freq: Three times a day (TID) | INTRAMUSCULAR | Status: DC
  Administered 2015-07-05 – 2015-07-10 (×15): 5000 [IU] via SUBCUTANEOUS
  Filled 2015-07-05 (×13): qty 1

## 2015-07-05 MED ORDER — DARBEPOETIN ALFA 60 MCG/0.3ML IJ SOSY
60.0000 ug | PREFILLED_SYRINGE | Freq: Once | INTRAMUSCULAR | Status: AC
  Administered 2015-07-05: 60 ug via INTRAVENOUS
  Filled 2015-07-05: qty 0.3

## 2015-07-05 MED ORDER — DARBEPOETIN ALFA 60 MCG/0.3ML IJ SOSY: 60.0000 ug | PREFILLED_SYRINGE | INTRAMUSCULAR | Status: DC

## 2015-07-05 NOTE — Progress Notes (Signed)
Vein mapping pending Access most likely Wednesday 07/08/15  Ruta Hinds, MD [facility] Office: [phone] Pager: [phone]

## 2015-07-05 NOTE — Progress Notes (Signed)
Charles [name]   DOB:05/04/1975   ZO#:[id number]   [id number]  Subjective: Charles [name] is not getting OOB much-- "too many things happening." Denies pain, SOB, fever, or bleeding. No h/a, N/V, dizzyness. Multiple family in room  Objective:  Middle-aged Spanish speaker examined in bed Filed Vitals:   07/05/15 0457 07/05/15 1002  BP: 130/70 127/77  Pulse: 76 77  Temp: 98.9 F (37.2 C) 99.4 F (37.4 C)  Resp: 18 18    Body mass index is 29.13 kg/(m^2).  Intake/Output Summary (Last 24 hours) at 07/05/15 1127 Last data filed at 07/05/15 0900  Gross per 24 hour  Intake    600 ml  Output   2100 ml  Net  -1500 ml   Lungs no rales or rhonchi--auscultated anterolaterally Heart regular rate and rhythm Abd soft, nontender, positive bowel sounds MSK HD catheter in upper R anterior chest as before Neuro: nonfocal, well oriented, depressed affect    CBG (last 3)  No results for input(s): GLUCAP in the last 72 hours.   Labs:  Lab Results  Component Value Date   WBC 8.7 07/05/2015   HGB 7.3* 07/05/2015   HCT 23.7* 07/05/2015   MCV 95.2 07/05/2015   PLT 335 07/05/2015   NEUTROABS 12.4* 07/03/2015    _0 @  Urine Studies No results for input(s): UHGB, CRYS in the last 72 hours.  Invalid input(s): UACOL, UAPR, USPG, UPH, UTP, UGL, UKET, UBIL, UNIT, UROB, [place], UEPI, UWBC, [name], [place]  Basic Metabolic Panel:  Recent Labs Lab 07/01/15 0327 07/02/15 0511 07/03/15 1842 07/04/15 1321 07/05/15 0434  NA 133* 132* 135 134* 137  K 3.6 4.6 5.2* 4.3 4.7  CL 103 102 105 101 99*  CO2 16* 15* 14* 22 25  GLUCOSE 100* 154* 93 93 103*  BUN 91* 101* 134* 85* 52*  CREATININE 4.74* 6.34* 7.56* 5.81* 4.66*  CALCIUM 8.2* 7.7* 7.7* 7.6* 7.7*  PHOS  --   --   --  8.9*  --          Ref Range 7d ago  4moago  632mogo     Kappa free light chain 3.30 - 19.40 mg/L 145.75 (H) 12.40 (H)R 10.50 (H)R   Comments: (NOTE)         Results for [name], [name] of 07/05/2015 11:29  Ref. Range 07/01/2015 03:27 07/02/2015 05:11 07/03/2015 18:42 07/04/2015 13:21 07/05/2015 04:34  Creatinine Latest Ref Range: 0.61-1.24 mg/dL 4.74 (H) 6.34 (H) 7.56 (H) 5.81 (H) 4.66 (H)   GFR Estimated Creatinine Clearance: 17.7 mL/min (by C-G formula based on Cr of 4.66). Liver Function Tests:  Recent Labs Lab 07/04/15 1321 07/05/15 0434  AST  --  16  ALT  --  17  ALKPHOS  --  275*  BILITOT  --  0.5  PROT  --  5.3*  ALBUMIN 1.1* 1.2*   No results for input(s): LIPASE, AMYLASE in the last 168 hours. No results for input(s): AMMONIA in the last 168 hours. Coagulation profile  Recent Labs Lab 07/03/15 0859  INR 1.34    CBC:  Recent Labs Lab 06/29/15 0311  06/30/15 0108  07/01/15 0327 07/02/15 0511 07/03/15 0859 07/04/15 1319 07/05/15 0434  WBC 17.4*  --  16.3*  --  18.1* 17.4* 15.0* 6.8 8.7  NEUTROABS 13.4*  --  12.2*  --  12.5* 14.3* 12.4*  --   --   HGB 7.0*  < > 6.8*  < > 7.8* 8.6* 7.7* 7.3* 7.3*  HCT 21.7*  < > 21.1*  < > 23.8* 26.3* 24.3* 23.4* 23.7*  MCV 96.4  --  93.8  --  94.1 93.9 95.3 94.4 95.2  PLT 434*  --  413*  --  454* 507* 432* 339 335  < > = values in this interval not displayed. Cardiac Enzymes: No results for input(s): CKTOTAL, CKMB, CKMBINDEX, TROPONINI in the last 168 hours. BNP: Invalid input(s): POCBNP CBG: No results for input(s): GLUCAP in the last 168 hours. D-Dimer No results for input(s): DDIMER in the last 72 hours. Hgb A1c No results for input(s): HGBA1C in the last 72 hours. Lipid Profile No results for input(s): CHOL, HDL, LDLCALC, TRIG, CHOLHDL, LDLDIRECT in the last 72 hours. Thyroid function studies No results for input(s): TSH, T4TOTAL, T3FREE, THYROIDAB in the last 72 hours.  Invalid input(s): FREET3 Anemia work up  Recent Labs  07/04/15 1319  FERRITIN 2488*  TIBC 129*  IRON 56   Microbiology Recent Results (from the past 240 hour(s))  Culture, blood (routine x 2) Call MD  if unable to obtain prior to antibiotics being given     Status: None   Collection Time: 06/25/15  5:05 PM  Result Value Ref Range Status   Specimen Description BLOOD RIGHT ARM  Final   Special Requests BOTTLES DRAWN AEROBIC AND ANAEROBIC 8 CC EA  Final   Culture   Final    NO GROWTH 5 DAYS Performed at [facility]    Report Status 06/30/2015 FINAL  Final  Culture, blood (routine x 2) Call MD if unable to obtain prior to antibiotics being given     Status: None   Collection Time: 06/25/15  5:08 PM  Result Value Ref Range Status   Specimen Description BLOOD RIGHT HAND  Final   Special Requests BOTTLES DRAWN AEROBIC AND ANAEROBIC 10 CC EA  Final   Culture   Final    NO GROWTH 5 DAYS Performed at [facility]    Report Status 06/30/2015 FINAL  Final  Urine culture     Status: None   Collection Time: 06/25/15  9:17 PM  Result Value Ref Range Status   Specimen Description URINE, CLEAN CATCH  Final   Special Requests NONE  Final   Culture NO GROWTH Performed at [facility]   Final   Report Status 06/27/2015 FINAL  Final  MRSA PCR Screening     Status: None   Collection Time: 06/26/15  5:00 PM  Result Value Ref Range Status   MRSA by PCR NEGATIVE NEGATIVE Final    Comment:        The GeneXpert MRSA Assay (FDA approved for NASAL specimens only), is one component of a comprehensive MRSA colonization surveillance program. It is not intended to diagnose MRSA infection nor to guide or monitor treatment for MRSA infections.       Studies:  Ir Fluoro Guide Cv Line Right  07/03/2015  INDICATION: 40 year old male with a history of renal failure. He has been referred for hemodialysis catheter placement. EXAM: TUNNELED CENTRAL VENOUS HEMODIALYSIS CATHETER PLACEMENT WITH ULTRASOUND AND FLUOROSCOPIC GUIDANCE MEDICATIONS: 2.0 g Ancef . The antibiotic was given in an appropriate time interval prior to skin puncture. ANESTHESIA/SEDATION: Moderate (conscious) sedation  was employed during this procedure. A total of Versed 1.5 mg and Fentanyl 25 mcg was administered intravenously. Moderate Sedation Time: 15 minutes. The patient's level of consciousness and vital signs were monitored continuously by radiology nursing throughout the procedure under my direct supervision.  FLUOROSCOPY TIME:  Fluoroscopy Time: 0 minutes 6 seconds (2 mGy). COMPLICATIONS: None PROCEDURE: Informed written consent was obtained from the patient after a discussion of the risks, benefits, and alternatives to treatment. Questions regarding the procedure were encouraged and answered. The right neck and chest were prepped with chlorhexidine in a sterile fashion, and a sterile drape was applied covering the operative field. Maximum barrier sterile technique with sterile gowns and gloves were used for the procedure. A timeout was performed prior to the initiation of the procedure. After creating a small venotomy incision, a micropuncture kit was utilized to access the right internal jugular vein under direct, real-time ultrasound guidance after the overlying soft tissues were anesthetized with 1% lidocaine with epinephrine. Ultrasound image documentation was performed. The microwire was marked to measure appropriate internal catheter length. External tunneled length was estimated. A total tip to cuff length of 19 cm was selected. Skin and subcutaneous tissues of chest wall below the clavicle were generously infiltrated with 1% lidocaine for local anesthesia. A small stab incision was made with 11 blade scalpel. The selected hemodialysis catheter was tunneled in a retrograde fashion from the anterior chest wall to the venotomy incision. A guidewire was advanced to the level of the IVC and the micropuncture sheath was exchanged for a peel-away sheath. The catheter was then placed through the peel-away sheath with tips ultimately positioned within the superior aspect of the right atrium. Final catheter positioning was  confirmed and documented with a spot radiographic image. The catheter aspirates and flushes normally. The catheter was flushed with appropriate volume heparin dwells. The catheter exit site was secured with a 0-Prolene retention suture. The venotomy incision was closed Derma bond and sterile dressing. Dressings were applied at the chest wall. Patient tolerated the procedure well and remained hemodynamically stable throughout. No complications were encountered and no significant blood loss encountered. IMPRESSION: Status post placement of right IJ approach hemodialysis catheter measuring 19 cm tip to cuff. Catheter ready for use. Signed, Dulcy Fanny. Earleen Newport, DO [facility] Radiology Electronically Signed   By: Corrie Mckusick D.O.   On: 07/03/2015 18:01   Ir US Guide Vasc Access Right  07/03/2015  INDICATION: 40 year old male with a history of renal failure. He has been referred for hemodialysis catheter placement. EXAM: TUNNELED CENTRAL VENOUS HEMODIALYSIS CATHETER PLACEMENT WITH ULTRASOUND AND FLUOROSCOPIC GUIDANCE MEDICATIONS: 2.0 g Ancef . The antibiotic was given in an appropriate time interval prior to skin puncture. ANESTHESIA/SEDATION: Moderate (conscious) sedation was employed during this procedure. A total of Versed 1.5 mg and Fentanyl 25 mcg was administered intravenously. Moderate Sedation Time: 15 minutes. The patient's level of consciousness and vital signs were monitored continuously by radiology nursing throughout the procedure under my direct supervision. FLUOROSCOPY TIME:  Fluoroscopy Time: 0 minutes 6 seconds (2 mGy). COMPLICATIONS: None PROCEDURE: Informed written consent was obtained from the patient after a discussion of the risks, benefits, and alternatives to treatment. Questions regarding the procedure were encouraged and answered. The right neck and chest were prepped with chlorhexidine in a sterile fashion, and a sterile drape was applied  covering the operative field. Maximum barrier sterile technique with sterile gowns and gloves were used for the procedure. A timeout was performed prior to the initiation of the procedure. After creating a small venotomy incision, a micropuncture kit was utilized to access the right internal jugular vein under direct, real-time ultrasound guidance after the overlying soft tissues were anesthetized with 1% lidocaine with epinephrine. Ultrasound image documentation  was performed. The microwire was marked to measure appropriate internal catheter length. External tunneled length was estimated. A total tip to cuff length of 19 cm was selected. Skin and subcutaneous tissues of chest wall below the clavicle were generously infiltrated with 1% lidocaine for local anesthesia. A small stab incision was made with 11 blade scalpel. The selected hemodialysis catheter was tunneled in a retrograde fashion from the anterior chest wall to the venotomy incision. A guidewire was advanced to the level of the IVC and the micropuncture sheath was exchanged for a peel-away sheath. The catheter was then placed through the peel-away sheath with tips ultimately positioned within the superior aspect of the right atrium. Final catheter positioning was confirmed and documented with a spot radiographic image. The catheter aspirates and flushes normally. The catheter was flushed with appropriate volume heparin dwells. The catheter exit site was secured with a 0-Prolene retention suture. The venotomy incision was closed Derma bond and sterile dressing. Dressings were applied at the chest wall. Patient tolerated the procedure well and remained hemodynamically stable throughout. No complications were encountered and no significant blood loss encountered. IMPRESSION: Status post placement of right IJ approach hemodialysis catheter measuring 19 cm tip to cuff. Catheter ready for use. Signed, Dulcy Fanny. Earleen Newport, DO [facility]  [facility] Radiology Electronically Signed   By: Corrie Mckusick D.O.   On: 07/03/2015 18:01    Assessment: 40 y.o. 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;   I: CyBorD started January 2016 (a) dexamethasone 20 mg/d every TU/WED started 02/04/2014 (b) bortezomib sQ days 1,4,8,11 of every 21 day cycle started 02/10/2014 (c) cyclophosphamide 300 mg/M2 IV weekly started 02/13/2014   2: Lenalidomide 66m daily started 07/26/2014, discontinued December 2016 with progression  (2) chronic kidney disease stage III--worsening creatinine; renal following  Results for JORDEAN, FOUTS(MRN [id number] as of 07/01/2015 08:16  Ref. Range 06/27/2015 03:28 06/28/2015 04:05 06/29/2015 03:11 06/30/2015 01:08 07/01/2015 03:27  Creatinine Latest Ref Range: 0.61-1.24 mg/dL 3.73 (H) 3.84 (H) 3.53 (H) 4.05 (H) 4.74 (H)    (3) hypercalcemia: zolendronate started 02/10/2014-- repeat every 4 weeks initially, then every 12 weeks (a) changed to denosumab/Xgeva starting 02/26/2015 because of CKD, repeated every 12 weeks  (4) ID prophylaxis: (a) influenza and Pneumovax [PV13] vaccines 02/07/2014 (b) acyclovir started 02/07/2014 (c) HIV negative 01/31/2014 (d) Pneumovax P23 too be given after March 2016  (5). The patient is not a transplant candidate for financial reasons  (6) bilateral foot rash: Started on Septra DS and Diflucan 09/03/2014, resolved  (7)  Multiple Myeloma relapse in December 2016.  (a) starting 02/05/15: dexamethasone 259mx2 consecutive days weekly; bortezomib sQ weekly,  (b) Septra DS twice daily (c) acyclovir 4003maily  (d) bortezomib/ dexamethasone discontinued after 05/06/2015 dose with evidence of progression  (8) started carfilzomib 05/28/2015, given with weekly low dose dexamethasone (20 mg po twice a week) (a) tolerated cyclophosphamide poorly, discontinued after cycle 1 (b) carfilzomib started 05/28/2015, first cycle only 4 doses (c) lenalidomide added 07/01/2015 at 10 mg po QOD  (d) cycle 2 carfilzomib started 07/01/2015  (9) palliative radiation: to areas  of painful bone involvement and spinal cord impingement, in process  (10) cancer-related pain: currently on MSContin 30 mg BID with hydrocodone/APAP for breakthrough pain  (a) bowel prophylaxis with miraLAX, docusate  (11) progressive renal failure: HD stated 07/03/2015    Plan:  Tolerating HD well. Aware of access plans, which I also discussed with him in Spanish.  Will give dexamethasone today. He is due for next carfilzomib 06/06. He is receiving XRT, to be resumed 06/05 if he can be transported to [facility] and back.  I am hopeful with the current treatment we can get his myeloma back into remission. Not clear if that will allow the kidneys to resume more normal function.  Will follow with you   [name], MD 07/05/2015  11:27 AM Medical Oncology and Hematology [facility] [address] [place], [place] [address] Tel. [phone]    Fax. [phone]

## 2015-07-05 NOTE — Progress Notes (Signed)
Right  Upper Extremity Vein Map    Cephalic  Segment Diameter Depth Comment  1. Axilla 45m 5.450m  2. Mid upper arm 2.893m943m.93mm53manch  3. Above AC 4.5mm 40mmm b76mch  4. In AC 5mm 2.69m   59melow AC 3.3mm 2.43m55m 6. 53m forearm 3.9mm 3.4mm 743m. Wr5m 3.43mm 3mm Mult8me 60mnches   mm mm    mm mm    mm mm      Left Upper Extremity Vein Map    Cephalic  Segment Diameter Depth Comment  1. Axilla 4.63mm 9mm   2. 60mup593m arm 4.5mm 6mm branch  43mAb843m AC 4mm 3.3mm branch  8mIn A843m.5mm 3.6mm   5. Below33m 2.750m3mm   6. Mid forea65m3.543m 3.93mm   7. Wrist 3.150m4.93m73m  mm mm    mm m293m  mm19m    Basilic  Segment Diameter Depth Comment  1. Axilla mm mm   2. Mid upper arm mm mm   3. Above AC/Origin 5mm 14mm branch  4. In AC 2.34m 375m   5. Below AC 3[facility] 65[facility]   674mid forearm 3.43mm 6193m  59mWrist 3.5mm 5.3mm    893mmm693m mm mm    mm mm243m60m

## 2015-07-05 NOTE — Progress Notes (Addendum)
[facility] Progress Note   Subjective: no c/o's  Filed Vitals:   07/04/15 2102 07/05/15 0457 07/05/15 1002 07/05/15 1359  BP: 140/75 130/70 127/77 144/84  Pulse: 92 76 77 104  Temp: 99.7 F (37.6 C) 98.9 F (37.2 C) 99.4 F (37.4 C)   TempSrc: Oral Oral Oral   Resp: '19 18 18   '$ Height:      Weight:      SpO2: 95% 95% 94%     Inpatient medications: . acyclovir  400 mg Oral QHS  . feeding supplement (ENSURE ENLIVE)  237 mL Oral TID BM  . hydrALAZINE  25 mg Oral Q8H  . lenalidomide  10 mg Oral Q48H  . metoprolol tartrate  50 mg Oral BID  . mirtazapine  15 mg Oral QHS  . morphine  30 mg Oral Q12H  . polyethylene glycol  17 g Oral Daily  . senna-docusate  2 tablet Oral BID     sodium chloride, acetaminophen, albuterol, albuterol, diatrizoate meglumine-sodium, famotidine, HYDROcodone-acetaminophen, prochlorperazine, sodium chloride flush, sodium chloride flush  Exam: General: alert , nad , calm   Heart: RRR 1/6 sem , no rub, no gal Lungs: CTA  Abdomen: bs pos, soft , NT, ND Extremities: no pedal edema  Dialysis Access: R IJ perm cath    Problems: 1 Renal failure - w known myeloma and progressive renal failure last 5 mos.  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  New TDC and sp HD x 2.  Have asked [facility] for permanent access this week. SW consulted for insurance assessment for OP HD.  Will need to ask for CLIP on Monday.   2 Vol^ excess - mild, improving 3 Mult myeloma - got carfilzomib 2x on 5/31and 6/1 last week, now back on maint lenalidomide 4 PRES syndrome - resolved 5 HTN urgency - resolved, on po hydral/ MTP, bp's good 6  MBD- check PTH / phos  7 Anemia - of ESRD and Myeloma, Hb low, tsat ok 40% 6/3, discussed w oncology, his chemo regimen should not be BM suppressive, they would support ESA rx. See orders  Plan - start darbe, 3rd HD Monday, perm access this week, SW , etc.     [name] MD [facility] pager [phone]    cell  [phone] 07/05/2015, 2:22 PM    Recent Labs Lab 07/03/15 1842 07/04/15 1321 07/05/15 0434  NA 135 134* 137  K 5.2* 4.3 4.7  CL 105 101 99*  CO2 14* 22 25  GLUCOSE 93 93 103*  BUN 134* 85* 52*  CREATININE 7.56* 5.81* 4.66*  CALCIUM 7.7* 7.6* 7.7*  PHOS  --  8.9*  --     Recent Labs Lab 07/04/15 1321 07/05/15 0434  AST  --  16  ALT  --  17  ALKPHOS  --  275*  BILITOT  --  0.5  PROT  --  5.3*  ALBUMIN 1.1* 1.2*    Recent Labs Lab 07/01/15 0327 07/02/15 0511 07/03/15 0859 07/04/15 1319 07/05/15 0434  WBC 18.1* 17.4* 15.0* 6.8 8.7  NEUTROABS 12.5* 14.3* 12.4*  --   --   HGB 7.8* 8.6* 7.7* 7.3* 7.3*  HCT 23.8* 26.3* 24.3* 23.4* 23.7*  MCV 94.1 93.9 95.3 94.4 95.2  PLT 454* 507* 432* 339 335   Iron/TIBC/Ferritin/ %Sat    Component Value Date/Time   IRON 56 07/04/2015 1319   TIBC 129* 07/04/2015 1319   FERRITIN 2488* 07/04/2015 1319   IRONPCTSAT 43* 07/04/2015 1319

## 2015-07-05 NOTE — Progress Notes (Signed)
Patient ID: Charles [name], male   DOB: 1975/04/18, 40 y.o.   MRN: [id number]                                                                PROGRESS NOTE                                                                                                                                                                                                             Patient Demographics:    Charles [name], is a 40 y.o. male, DOB - 08-08-1975, EGB:[id number]  Admit date - 06/23/2015   Admitting Physician Domenic Polite, MD  Outpatient Primary MD for the patient is [name], MD  LOS - 12  Outpatient Specialists:  Chief Complaint  Patient presents with  . Emesis       Brief Narrative  Charles [name] is a 40 y.o. male with medical history significant of Multiple Myeloma, CKD 3, back pain, currently undergoing chemotherapy, reports he was started on a new chemotherapeutic agent recently. Patient admitted with Nausea, vomiting, diarrhea. Thought to be to chemo related. Subsequently he developed Press syndrome , Malignant HTN, visual disturbabce and ?PNA. Neurology consulted. BP better. He spike fevers, chest x ray consistent with ?PNA. He was started on Vancomycin and cefepime. Vancomycin stopped 5-30 due to worsening renal function. Nephrology consulted. Also spike fever 5-29 after several days of broad spectrum abx, ID consulted. Fever is deemed to be likely tumor fever, patient is off abx for now Mri spine showed MM progression and cord involvement, oncology has contacted IR. Worsening of renal function, nephrology recommended patient to be transferred [facility] to [facility] for HD.  Subjective:    Charles [name] today has still some complaints of back pain but doing better. Eating presently. Pt denies cp, palp, sob.     Assessment  & Plan :    Principal Problem:   AKI (acute kidney injury) ([place]) Active Problems:   Multiple myeloma (HCC)   CKD (chronic kidney  disease) stage 3, GFR 30-59 ml/min   Nausea with vomiting   Blurry vision, bilateral   HTN (hypertension), malignant   PRES (posterior reversible encephalopathy syndrome)   FUO (fever of unknown origin)   Back pain   Multiple myeloma not having achieved remission ([place])  Multiple myeloma in relapse (HCC)   Symptomatic anemia   Multiple myeloma (HCC) in relapse,  --restarted treatment with carfizomib, lenalidomide, dexamethasone, on 5/31, continue acyclovir prophylaxis. Prn pain control -mri spine with cord compression, oncology talked to rad onc -Oncology Dr Jana Hakim input appreciated: receieving XRT with daily (M-F) treatments currently scheduled through 6/14   AKI (acute kidney injury) ([place]), secondary to Myeloma Kidney -on CKD 3, baseline is 3.3 - [place] negative for hydronephrosis.  Pt is presently on HD,  x2  Doing well,  Appreciate nephrology input  HTN Emergency, Pres syndrome; (? side effect from protesome inhibitor? Please see oncology and renal notes)  resolved Patient report vision changes the morning 5-26, MRI consistent with Pres.  neurology consulted recommended repeat MRI in 1 or 2 weeks from 5/26. off nicardipine Gtt on 5/31, Patient was transitioned to Hydralazine 51m tid and and metoprolol 738mbid => later decrease to hydralazine 2571mo tid, and metoprolol 37m34m bidSBP goal 120--140 Per neurology  Fevers; secondary to multiple myeloma,  chest x ray ? PNA. Uti? Urine multiples bacteria, repeated urine culture no growth to date.  Received cefepime and vancomycin for 5 days.  WBC fluctuates. Last Spike fever 5-30 ID consulted due to persistent fevers. Pan CT unrevealing, MRI spine no infection  ID has signed off, appreciate input   Nausea with vomiting on admission -could be from chemo, ? Infection, Ct ab no obstruction. -seems resolved   Anemia; secondary to MM;.  Received one unit 5-30. Keep hgb> 7.   Thyroid nodules: can follow up as  oupatient  Back pain: stable, improved  DVT prophylaxis: heparin, scd    Code Status : FULL CODE  Family Communication  : w/ wife 6/4  Disposition Plan  : SNF?  Barriers For Discharge :   Consults  :  Nephrology, oncology, neurology  Procedures  :   DVT Prophylaxis  :  Heparin - SCDs   Lab Results  Component Value Date   PLT 335 07/05/2015    Antibiotics  :   acyclovir Anti-infectives    Start     Dose/Rate Route Frequency Ordered Stop   07/03/15 2200  acyclovir (ZOVIRAX) tablet 400 mg     400 mg Oral Daily at bedtime 07/03/15 0933     07/03/15 1317  ceFAZolin (ANCEF) 2-3 GM-% IVPB SOLR    Comments:  Dhers, Patricia   : cabinet override      07/03/15 1317 07/03/15 1327   07/03/15 1200  ceFAZolin (ANCEF) IVPB 2g/100 mL premix     2 g 200 mL/hr over 30 Minutes Intravenous To Radiology 07/03/15 1153 07/04/15 1200   06/30/15 1600  vancomycin (VANCOCIN) IVPB 750 mg/150 ml premix  Status:  Discontinued     750 mg 150 mL/hr over 60 Minutes Intravenous Every 24 hours 06/29/15 1742 06/30/15 0642   06/27/15 1000  acyclovir (ZOVIRAX) tablet 400 mg  Status:  Discontinued     400 mg Oral Daily 06/27/15 0502 07/03/15 0933   06/25/15 1800  ceFEPIme (MAXIPIME) 1 g in dextrose 5 % 50 mL IVPB  Status:  Discontinued     1 g 100 mL/hr over 30 Minutes Intravenous Every 24 hours 06/25/15 1604 06/30/15 2221   06/25/15 1700  vancomycin (VANCOCIN) IVPB 1000 mg/200 mL premix  Status:  Discontinued     1,000 mg 200 mL/hr over 60 Minutes Intravenous Every 48 hours 06/25/15 1613 06/29/15 1742   06/24/15 1000  acyclovir (ZOVIRAX) 200 MG capsule 400 mg  Status:  Discontinued     400 mg Oral Daily 06/23/15 1858 06/27/15 0502   06/24/15 0800  cefTRIAXone (ROCEPHIN) 1 g in dextrose 5 % 50 mL IVPB  Status:  Discontinued     1 g 100 mL/hr over 30 Minutes Intravenous Every 24 hours 06/23/15 1858 06/25/15 1603   06/23/15 1615  cefTRIAXone (ROCEPHIN) 1 g in dextrose 5 % 50 mL IVPB     1 g 100 mL/hr  over 30 Minutes Intravenous  Once 06/23/15 1602 06/23/15 1638        Objective:   Filed Vitals:   07/04/15 1842 07/04/15 2102 07/05/15 0457 07/05/15 1002  BP: 129/67 140/75 130/70 127/77  Pulse: 102 92 76 77  Temp: 98.4 F (36.9 C) 99.7 F (37.6 C) 98.9 F (37.2 C) 99.4 F (37.4 C)  TempSrc: Oral Oral Oral Oral  Resp: _0 Height:      Weight:      SpO2: 98% 95% 95% 94%    Wt Readings from Last 3 Encounters:  07/04/15 69.9 kg (154 lb 1.6 oz)  06/26/15 67.132 kg (148 lb)  06/11/15 67.223 kg (148 lb 3.2 oz)     Intake/Output Summary (Last 24 hours) at 07/05/15 1342 Last data filed at 07/05/15 0900  Gross per 24 hour  Intake    360 ml  Output   2100 ml  Net  -1740 ml     Physical Exam  Awake Alert, Oriented X 3, No new F.N deficits, Normal affect [place].AT,PERRAL Supple Neck,No JVD, No cervical lymphadenopathy appriciated.  Symmetrical Chest wall movement, Good air movement bilaterally, CTAB RRR,No Gallops,Rubs or new Murmurs, No Parasternal Heave +ve B.Sounds, Abd Soft, No tenderness, No organomegaly appriciated, No rebound - guarding or rigidity. No Cyanosis, Clubbing or edema, No new Rash or bruise     Data Review:    CBC  Recent Labs Lab 06/29/15 0311  06/30/15 0108  07/01/15 0327 07/02/15 0511 07/03/15 0859 07/04/15 1319 07/05/15 0434  WBC 17.4*  --  16.3*  --  18.1* 17.4* 15.0* 6.8 8.7  HGB 7.0*  < > 6.8*  < > 7.8* 8.6* 7.7* 7.3* 7.3*  HCT 21.7*  < > 21.1*  < > 23.8* 26.3* 24.3* 23.4* 23.7*  PLT 434*  --  413*  --  454* 507* 432* 339 335  MCV 96.4  --  93.8  --  94.1 93.9 95.3 94.4 95.2  MCH 31.1  --  30.2  --  30.8 30.7 30.2 29.4 29.3  MCHC 32.3  --  32.2  --  32.8 32.7 31.7 31.2 30.8  RDW 15.1  --  14.8  --  15.1 15.0 15.1 15.0 15.1  LYMPHSABS 2.1  --  2.1  --  2.7 1.2 1.3  --   --   MONOABS 1.7*  --  2.0*  --  2.7* 1.9* 1.4*  --   --   EOSABS 0.2  --  0.0  --  0.2 0.0 0.0  --   --   BASOSABS 0.0  --  0.0  --  0.0 0.0 0.0  --   --   <  > = values in this interval not displayed.  Chemistries   Recent Labs Lab 07/01/15 0327 07/02/15 0511 07/03/15 1842 07/04/15 1321 07/05/15 0434  NA 133* 132* 135 134* 137  K 3.6 4.6 5.2* 4.3 4.7  CL 103 102 105 101 99*  CO2 16* 15* 14* 22 25  GLUCOSE 100* 154* 93 93  103*  BUN 91* 101* 134* 85* 52*  CREATININE 4.74* 6.34* 7.56* 5.81* 4.66*  CALCIUM 8.2* 7.7* 7.7* 7.6* 7.7*  AST  --   --   --   --  16  ALT  --   --   --   --  17  ALKPHOS  --   --   --   --  275*  BILITOT  --   --   --   --  0.5   ------------------------------------------------------------------------------------------------------------------ No results for input(s): CHOL, HDL, LDLCALC, TRIG, CHOLHDL, LDLDIRECT in the last 72 hours.  No results found for: HGBA1C ------------------------------------------------------------------------------------------------------------------ No results for input(s): TSH, T4TOTAL, T3FREE, THYROIDAB in the last 72 hours.  Invalid input(s): FREET3 ------------------------------------------------------------------------------------------------------------------  Recent Labs  07/04/15 1319  FERRITIN 2488*  TIBC 129*  IRON 56    Coagulation profile  Recent Labs Lab 07/03/15 0859  INR 1.34    No results for input(s): DDIMER in the last 72 hours.  Cardiac Enzymes No results for input(s): CKMB, TROPONINI, MYOGLOBIN in the last 168 hours.  Invalid input(s): CK ------------------------------------------------------------------------------------------------------------------ No results found for: BNP  Inpatient Medications  Scheduled Meds: . acyclovir  400 mg Oral QHS  . feeding supplement (ENSURE ENLIVE)  237 mL Oral TID BM  . hydrALAZINE  25 mg Oral Q8H  . lenalidomide  10 mg Oral Q48H  . metoprolol tartrate  50 mg Oral BID  . mirtazapine  15 mg Oral QHS  . morphine  30 mg Oral Q12H  . polyethylene glycol  17 g Oral Daily  . senna-docusate  2 tablet Oral BID     Continuous Infusions:  PRN Meds:.sodium chloride, acetaminophen, albuterol, albuterol, diatrizoate meglumine-sodium, diphenhydrAMINE, diphenhydrAMINE, diphenhydrAMINE, diphenhydrAMINE, EPINEPHrine, EPINEPHrine, EPINEPHrine, EPINEPHrine, EPINEPHrine, EPINEPHrine, EPINEPHrine, EPINEPHrine, famotidine, heparin lock flush, heparin lock flush, heparin lock flush, heparin lock flush, heparin lock flush, heparin lock flush, HYDROcodone-acetaminophen, methylPREDNISolone sodium succinate, methylPREDNISolone sodium succinate, prochlorperazine, sodium chloride flush, sodium chloride flush, sodium chloride flush, sodium chloride flush  Micro Results Recent Results (from the past 240 hour(s))  Culture, blood (routine x 2) Call MD if unable to obtain prior to antibiotics being given     Status: None   Collection Time: 06/25/15  5:05 PM  Result Value Ref Range Status   Specimen Description BLOOD RIGHT ARM  Final   Special Requests BOTTLES DRAWN AEROBIC AND ANAEROBIC 8 CC EA  Final   Culture   Final    NO GROWTH 5 DAYS Performed at [facility]    Report Status 06/30/2015 FINAL  Final  Culture, blood (routine x 2) Call MD if unable to obtain prior to antibiotics being given     Status: None   Collection Time: 06/25/15  5:08 PM  Result Value Ref Range Status   Specimen Description BLOOD RIGHT HAND  Final   Special Requests BOTTLES DRAWN AEROBIC AND ANAEROBIC 10 CC EA  Final   Culture   Final    NO GROWTH 5 DAYS Performed at [facility]    Report Status 06/30/2015 FINAL  Final  Urine culture     Status: None   Collection Time: 06/25/15  9:17 PM  Result Value Ref Range Status   Specimen Description URINE, CLEAN CATCH  Final   Special Requests NONE  Final   Culture NO GROWTH Performed at [facility]   Final   Report Status 06/27/2015 FINAL  Final  MRSA PCR Screening     Status: None   Collection Time: 06/26/15  5:00 PM  Result Value Ref Range Status   MRSA by PCR NEGATIVE  NEGATIVE Final    Comment:        The GeneXpert MRSA Assay (FDA approved for NASAL specimens only), is one component of a comprehensive MRSA colonization surveillance program. It is not intended to diagnose MRSA infection nor to guide or monitor treatment for MRSA infections.     Radiology Reports Ct Abdomen Pelvis Wo Contrast  06/30/2015  CLINICAL DATA:  History of multiple myeloma with chronic kidney disease stage 3 and back pain currently undergoing chemotherapy. Nausea, vomiting, diarrhea, fever and chills four days. EXAM: CT CHEST, ABDOMEN AND PELVIS WITHOUT CONTRAST TECHNIQUE: Multidetector CT imaging of the chest, abdomen and pelvis was performed following the standard protocol without IV contrast. COMPARISON:  CT 06/23/2015, 02/03/2014 and 01/30/2014 FINDINGS: CT CHEST Lungs are adequately inflated demonstrate a small amount of bilateral pleural fluid left greater than right with associated atelectasis in the posterior lung bases left greater than right. There are new multiple bilateral well-defined focal pleural masses right greater than left with the largest over the right apex measuring 2.8 x 5.2 cm. These likely represent spread of patient's known multiple myeloma. Airways are normal. Heart is normal size. No definite mediastinal or hilar adenopathy. Small amount contrast within the mid to distal esophagus likely due to dysmotility versus reflux. Remaining mediastinal structures are unremarkable. There are couple hypodense thyroid nodules with the largest measuring 1.2 cm over the left lobe. Patient's known destructive sternal manubrial lesion is unchanged. There is subtle increased lucency over the body of the sternum which is new likely spread of patient's known myeloma. There are several lucencies over the thoracic spine with the most prominent over the T9 vertebral body. CT ABDOMEN AND PELVIS The liver, spleen, gallbladder, pancreas and adrenal glands are within normal. Kidneys normal  size without hydronephrosis or nephrolithiasis. Stable exophytic hypodensity over the mid pole cortex of the left kidney likely a cyst. Ureters are normal. Stomach is within normal. Small bowel is normal. Appendix is normal. Subtle diverticulosis of the colon most prominent over the right colon. There is no significant free fluid or focal inflammatory change. Few small periaortic lymph nodes. Vascular structures are unremarkable. Pelvic images demonstrate the bladder, prostate and rectum to be within normal. Ill-defined low-attenuation over the right iliac bone likely a myelomatous lesion. Ill-defined lytic process over the right femoral neck spanning nearly the entire width of the femoral neck increasing risk of pathologic fracture. Bone island over the left sacrum. IMPRESSION: No acute findings in the chest, abdomen or pelvis. Lytic lesions within the sternum, spine, right iliac bone and right femoral neck compatible with patient's known myeloma. Note that patient is at risk for pathologic fracture through the right femoral neck. Multiple new bilateral focal pleural masses with the largest over the right apex measuring 2.8 x 5.2 cm likely metastatic disease from patient's multiple myeloma. Small amount of bilateral pleural fluid bibasilar consolidation most typical of atelectasis. Minimal diverticulosis of the colon. Stable 1.3 cm exophytic hypodensity over the mid pole left kidney likely a cyst. Couple small hypodense thyroid nodules with the larger over the left lobe measuring 1.2 cm. Recommend follow-up as clinically indicated. Electronically Signed   By: Marin Olp M.D.   On: 06/30/2015 17:24   Dg Chest 2 View  06/25/2015  CLINICAL DATA:  Pt complains of Nausea, Vomiting, diarrhea, fever and chills x 4 days. Hx of multiple myeloma and on chemo. EXAM: CHEST  2 VIEW  COMPARISON:  Multiple prior studies including 01/30/2014 and 02-04-2015 FINDINGS: Shallow lung inflation. The heart is mildly prominent in  size. There are small bilateral pleural effusions. Minimal streaky density identified at the lung bases, left greater than right. There is right apical extrapleural soft tissue density. Rounded soft tissue density overlies the right posterior lateral fifth rib and there is possible expansion of the right anterior fifth rib. There is a remote fracture of the right second anterior rib. IMPRESSION: 1. Interval change in the appearance of the chest. 2. Increased soft tissue masses and possible expansion of the right anterior fifth rib. 3. 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 Consider further evaluation with CT of the chest as needed. 4. Bilateral lower lobe atelectasis and/or infiltrates, left greater than right. Electronically Signed   By: Nolon Nations M.D.   On: 06/25/2015 15:58   Ct Chest Wo Contrast  06/30/2015  CLINICAL DATA:  History of multiple myeloma with chronic kidney disease stage 3 and back pain currently undergoing chemotherapy. Nausea, vomiting, diarrhea, fever and chills four days. EXAM: CT CHEST, ABDOMEN AND PELVIS WITHOUT CONTRAST TECHNIQUE: Multidetector CT imaging of the chest, abdomen and pelvis was performed following the standard protocol without IV contrast. COMPARISON:  CT 06/23/2015, 02/03/2014 and 01/30/2014 FINDINGS: CT CHEST Lungs are adequately inflated demonstrate a small amount of bilateral pleural fluid left greater than right with associated atelectasis in the posterior lung bases left greater than right. There are new multiple bilateral well-defined focal pleural masses right greater than left with the largest over the right apex measuring 2.8 x 5.2 cm. These likely represent spread of patient's known multiple myeloma. Airways are normal. Heart is normal size. No definite mediastinal or hilar adenopathy. Small amount contrast within the mid to distal esophagus likely due to dysmotility versus reflux. Remaining mediastinal structures are unremarkable. There are couple hypodense  thyroid nodules with the largest measuring 1.2 cm over the left lobe. Patient's known destructive sternal manubrial lesion is unchanged. There is subtle increased lucency over the body of the sternum which is new likely spread of patient's known myeloma. There are several lucencies over the thoracic spine with the most prominent over the T9 vertebral body. CT ABDOMEN AND PELVIS The liver, spleen, gallbladder, pancreas and adrenal glands are within normal. Kidneys normal size without hydronephrosis or nephrolithiasis. Stable exophytic hypodensity over the mid pole cortex of the left kidney likely a cyst. Ureters are normal. Stomach is within normal. Small bowel is normal. Appendix is normal. Subtle diverticulosis of the colon most prominent over the right colon. There is no significant free fluid or focal inflammatory change. Few small periaortic lymph nodes. Vascular structures are unremarkable. Pelvic images demonstrate the bladder, prostate and rectum to be within normal. Ill-defined low-attenuation over the right iliac bone likely a myelomatous lesion. Ill-defined lytic process over the right femoral neck spanning nearly the entire width of the femoral neck increasing risk of pathologic fracture. Bone island over the left sacrum. IMPRESSION: No acute findings in the chest, abdomen or pelvis. Lytic lesions within the sternum, spine, right iliac bone and right femoral neck compatible with patient's known myeloma. Note that patient is at risk for pathologic fracture through the right femoral neck. Multiple new bilateral focal pleural masses with the largest over the right apex measuring 2.8 x 5.2 cm likely metastatic disease from patient's multiple myeloma. Small amount of bilateral pleural fluid bibasilar consolidation most typical of atelectasis. Minimal diverticulosis of the colon. Stable 1.3 cm exophytic hypodensity over the mid pole left  kidney likely a cyst. Couple small hypodense thyroid nodules with the  larger over the left lobe measuring 1.2 cm. Recommend follow-up as clinically indicated. Electronically Signed   By: Marin Olp M.D.   On: 06/30/2015 17:24   Mr Brain Wo Contrast  06/26/2015  CLINICAL DATA:  Bilateral blurry vision. Fever. History of multiple myeloma on chemotherapy. Elevated blood pressures. EXAM: MRI HEAD WITHOUT CONTRAST TECHNIQUE: Multiplanar, multiecho pulse sequences of the brain and surrounding structures were obtained without intravenous contrast. COMPARISON:  Head CT 10/11/2010 FINDINGS: There is abnormal T2 hyperintensity involving cortex and subcortical white matter in both occipital lobes and left greater than right parietal lobes. No restricted diffusion or hemorrhage is seen. The brain is otherwise normal in signal aside from scattered, punctate foci of T2 hyperintensity in the cerebral white matter which are nonspecific. No mass, midline shift, or extra-axial fluid collection is seen. Ventricles are normal in size. Orbits are unremarkable. A small amount of fluid is present in the right maxillary sinus. The mastoid air cells are clear. Major intracranial vascular flow voids are preserved. There is a 1.4 cm left frontal skull lesion with extension through the inner and outer tables of the skull and small volume epidural tumor without significant mass effect on the underlying brain. No brain edema. Multiple other, smaller lesions are scattered elsewhere in the skull. Baseline bone marrow signal is diffusely diminished throughout the skull and visualized upper cervical spine and may be related to ongoing treatment. IMPRESSION: 1. 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 2. Multiple skull lesions consistent with known multiple myeloma. 1.4 cm left frontal lesion with mild epidural extension. Electronically Signed   By: Logan Bores M.D.   On: 06/26/2015 12:22   Mr Thoracic Spine Wo Contrast  07/02/2015  CLINICAL DATA:  Multiple myeloma.   Back pain.  Fever EXAM: MRI THORACIC AND LUMBAR SPINE WITHOUT CONTRAST TECHNIQUE: Multiplanar and multiecho pulse sequences of the thoracic and lumbar spine were obtained without intravenous contrast. COMPARISON:  CT chest abdomen and pelvis 06/30/2015 FINDINGS: MRI THORACIC SPINE FINDINGS Alignment:  Normal Vertebrae: Diffuse infiltration of the bone marrow throughout all vertebral bodies consistent with myeloma. This also involves the posterior elements and ribs bilaterally. There is extradural tumor in the spinal canal posteriorly at T4, T7, and T 10-11. Extraosseous mass lesions in the ribs bilaterally consistent with myeloma. Cord: Posterior extradural mass at the T4 level causing mild spinal stenosis. Posterior extradural mass at the T7 level causing moderate spinal stenosis and mild compression of the cord. Cord has normal signal. Mild posterior extradural tumor at T10-11 extending to the right. This is causing right foraminal stenosis. No significant spinal stenosis. Paraspinal and other soft tissues: Paraspinous muscles normal. Small bilateral pleural effusions. Diffuse rib infiltration by mall myeloma with multiple extradural rib lesions. Disc levels: Negative for disc protrusion. No significant disc degeneration. MRI LUMBAR SPINE FINDINGS Segmentation:  Normal.  Lowest disc space L5-S1 Alignment:  Normal Vertebrae: Diffuse infiltration of the bone marrow by myeloma. This involves all vertebral bodies and extends into the posterior elements. There is involvement of the pelvis. Sacrum is diffusely involved. Negative for fracture. Conus medullaris: Extends to the L1 level and appears normal. Paraspinal and other soft tissues: No retroperitoneal adenopathy. Extraosseous mass from the left iliac wing displacing iliopsoas anteriorly Disc levels: L1-2:  Negative L2-3:  Negative L3-4:  Bilateral facet degeneration.  Normal disc space L4-5:  Bilateral facet degeneration.  Normal disc space L5-S1: Small central  disc protrusion without neural impingement. IMPRESSION: MR THORACIC SPINE IMPRESSION Extensive and diffuse infiltration of the bone marrow with myeloma. No fracture. Posterior extradural mass the T4 level causing mild spinal stenosis. Posterior epidural mass at the T7 level causing moderate spinal stenosis and mild compression of the cord. MR LUMBAR SPINE IMPRESSION Diffuse bone marrow infiltration throughout the lumbar spine, sacrum, and pelvis. No fracture. No tumor is seen in the lumbar spinal canal. No evidence of infection in the thoracic or lumbar spine. Electronically Signed   By: Franchot Gallo M.D.   On: 07/02/2015 09:00   Mr Lumbar Spine Wo Contrast  07/02/2015  CLINICAL DATA:  Multiple myeloma.  Back pain.  Fever EXAM: MRI THORACIC AND LUMBAR SPINE WITHOUT CONTRAST TECHNIQUE: Multiplanar and multiecho pulse sequences of the thoracic and lumbar spine were obtained without intravenous contrast. COMPARISON:  CT chest abdomen and pelvis 06/30/2015 FINDINGS: MRI THORACIC SPINE FINDINGS Alignment:  Normal Vertebrae: Diffuse infiltration of the bone marrow throughout all vertebral bodies consistent with myeloma. This also involves the posterior elements and ribs bilaterally. There is extradural tumor in the spinal canal posteriorly at T4, T7, and T 10-11. Extraosseous mass lesions in the ribs bilaterally consistent with myeloma. Cord: Posterior extradural mass at the T4 level causing mild spinal stenosis. Posterior extradural mass at the T7 level causing moderate spinal stenosis and mild compression of the cord. Cord has normal signal. Mild posterior extradural tumor at T10-11 extending to the right. This is causing right foraminal stenosis. No significant spinal stenosis. Paraspinal and other soft tissues: Paraspinous muscles normal. Small bilateral pleural effusions. Diffuse rib infiltration by mall myeloma with multiple extradural rib lesions. Disc levels: Negative for disc protrusion. No significant disc  degeneration. MRI LUMBAR SPINE FINDINGS Segmentation:  Normal.  Lowest disc space L5-S1 Alignment:  Normal Vertebrae: Diffuse infiltration of the bone marrow by myeloma. This involves all vertebral bodies and extends into the posterior elements. There is involvement of the pelvis. Sacrum is diffusely involved. Negative for fracture. Conus medullaris: Extends to the L1 level and appears normal. Paraspinal and other soft tissues: No retroperitoneal adenopathy. Extraosseous mass from the left iliac wing displacing iliopsoas anteriorly Disc levels: L1-2:  Negative L2-3:  Negative L3-4:  Bilateral facet degeneration.  Normal disc space L4-5:  Bilateral facet degeneration.  Normal disc space L5-S1: Small central disc protrusion without neural impingement. IMPRESSION: MR THORACIC SPINE IMPRESSION Extensive and diffuse infiltration of the bone marrow with myeloma. No fracture. Posterior extradural mass the T4 level causing mild spinal stenosis. Posterior epidural mass at the T7 level causing moderate spinal stenosis and mild compression of the cord. MR LUMBAR SPINE IMPRESSION Diffuse bone marrow infiltration throughout the lumbar spine, sacrum, and pelvis. No fracture. No tumor is seen in the lumbar spinal canal. No evidence of infection in the thoracic or lumbar spine. Electronically Signed   By: Franchot Gallo M.D.   On: 07/02/2015 09:00   US Renal Port  06/24/2015  CLINICAL DATA:  Acute kidney injury. EXAM: RENAL / URINARY TRACT ULTRASOUND COMPLETE COMPARISON:  CT scan of Jun 23, 2015. FINDINGS: Right Kidney: Length: 12.1 cm. Mildly increased echogenicity of renal parenchyma is noted. No mass or hydronephrosis visualized. Left Kidney: Length: 11.9 cm. Mildly increased echogenicity of renal parenchyma is noted. 1.6 cm simple exophytic cyst is seen arising from midpole. No mass or hydronephrosis visualized. Bladder: Appears normal for degree of bladder distention. IMPRESSION: Mildly increased echogenicity of renal  parenchyma is noted bilaterally suggesting medical renal disease. No hydronephrosis  or renal obstruction is noted. Electronically Signed   By: Marijo Conception, M.D.   On: 06/24/2015 09:50   Ir Fluoro Guide Cv Line Right  07/03/2015  INDICATION: 40 year old male with a history of renal failure. He has been referred for hemodialysis catheter placement. EXAM: TUNNELED CENTRAL VENOUS HEMODIALYSIS CATHETER PLACEMENT WITH ULTRASOUND AND FLUOROSCOPIC GUIDANCE MEDICATIONS: 2.0 g Ancef . The antibiotic was given in an appropriate time interval prior to skin puncture. ANESTHESIA/SEDATION: Moderate (conscious) sedation was employed during this procedure. A total of Versed 1.5 mg and Fentanyl 25 mcg was administered intravenously. Moderate Sedation Time: 15 minutes. The patient's level of consciousness and vital signs were monitored continuously by radiology nursing throughout the procedure under my direct supervision. FLUOROSCOPY TIME:  Fluoroscopy Time: 0 minutes 6 seconds (2 mGy). COMPLICATIONS: None PROCEDURE: Informed written consent was obtained from the patient after a discussion of the risks, benefits, and alternatives to treatment. Questions regarding the procedure were encouraged and answered. The right neck and chest were prepped with chlorhexidine in a sterile fashion, and a sterile drape was applied covering the operative field. Maximum barrier sterile technique with sterile gowns and gloves were used for the procedure. A timeout was performed prior to the initiation of the procedure. After creating a small venotomy incision, a micropuncture kit was utilized to access the right internal jugular vein under direct, real-time ultrasound guidance after the overlying soft tissues were anesthetized with 1% lidocaine with epinephrine. Ultrasound image documentation was performed. The microwire was marked to measure appropriate internal catheter length. External tunneled length was estimated. A total tip to cuff length  of 19 cm was selected. Skin and subcutaneous tissues of chest wall below the clavicle were generously infiltrated with 1% lidocaine for local anesthesia. A small stab incision was made with 11 blade scalpel. The selected hemodialysis catheter was tunneled in a retrograde fashion from the anterior chest wall to the venotomy incision. A guidewire was advanced to the level of the IVC and the micropuncture sheath was exchanged for a peel-away sheath. The catheter was then placed through the peel-away sheath with tips ultimately positioned within the superior aspect of the right atrium. Final catheter positioning was confirmed and documented with a spot radiographic image. The catheter aspirates and flushes normally. The catheter was flushed with appropriate volume heparin dwells. The catheter exit site was secured with a 0-Prolene retention suture. The venotomy incision was closed Derma bond and sterile dressing. Dressings were applied at the chest wall. Patient tolerated the procedure well and remained hemodynamically stable throughout. No complications were encountered and no significant blood loss encountered. IMPRESSION: Status post placement of right IJ approach hemodialysis catheter measuring 19 cm tip to cuff. Catheter ready for use. Signed, Dulcy Fanny. Earleen Newport, DO [facility] Electronically Signed   By: Corrie Mckusick D.O.   On: 07/03/2015 18:01   Ir US Guide Vasc Access Right  07/03/2015  INDICATION: 40 year old male with a history of renal failure. He has been referred for hemodialysis catheter placement. EXAM: TUNNELED CENTRAL VENOUS HEMODIALYSIS CATHETER PLACEMENT WITH ULTRASOUND AND FLUOROSCOPIC GUIDANCE MEDICATIONS: 2.0 g Ancef . The antibiotic was given in an appropriate time interval prior to skin puncture. ANESTHESIA/SEDATION: Moderate (conscious) sedation was employed during this procedure. A total of Versed 1.5 mg and Fentanyl 25 mcg was  administered intravenously. Moderate Sedation Time: 15 minutes. The patient's level of consciousness and vital signs were monitored continuously by radiology nursing throughout the procedure under my direct supervision. FLUOROSCOPY  TIME:  Fluoroscopy Time: 0 minutes 6 seconds (2 mGy). COMPLICATIONS: None PROCEDURE: Informed written consent was obtained from the patient after a discussion of the risks, benefits, and alternatives to treatment. Questions regarding the procedure were encouraged and answered. The right neck and chest were prepped with chlorhexidine in a sterile fashion, and a sterile drape was applied covering the operative field. Maximum barrier sterile technique with sterile gowns and gloves were used for the procedure. A timeout was performed prior to the initiation of the procedure. After creating a small venotomy incision, a micropuncture kit was utilized to access the right internal jugular vein under direct, real-time ultrasound guidance after the overlying soft tissues were anesthetized with 1% lidocaine with epinephrine. Ultrasound image documentation was performed. The microwire was marked to measure appropriate internal catheter length. External tunneled length was estimated. A total tip to cuff length of 19 cm was selected. Skin and subcutaneous tissues of chest wall below the clavicle were generously infiltrated with 1% lidocaine for local anesthesia. A small stab incision was made with 11 blade scalpel. The selected hemodialysis catheter was tunneled in a retrograde fashion from the anterior chest wall to the venotomy incision. A guidewire was advanced to the level of the IVC and the micropuncture sheath was exchanged for a peel-away sheath. The catheter was then placed through the peel-away sheath with tips ultimately positioned within the superior aspect of the right atrium. Final catheter positioning was confirmed and documented with a spot radiographic image. The catheter aspirates and  flushes normally. The catheter was flushed with appropriate volume heparin dwells. The catheter exit site was secured with a 0-Prolene retention suture. The venotomy incision was closed Derma bond and sterile dressing. Dressings were applied at the chest wall. Patient tolerated the procedure well and remained hemodynamically stable throughout. No complications were encountered and no significant blood loss encountered. IMPRESSION: Status post placement of right IJ approach hemodialysis catheter measuring 19 cm tip to cuff. Catheter ready for use. Signed, Dulcy Fanny. Earleen Newport, DO [facility] Electronically Signed   By: Corrie Mckusick D.O.   On: 07/03/2015 18:01   Ct Renal Stone Study  06/23/2015  CLINICAL DATA:  History multiple myeloma currently on chemotherapy. Chronic kidney disease. Nausea, vomiting and diarrhea 4 days. Generalized abdominal pain with chills and weakness as well as dysuria and dark urine. Mid back pain. EXAM: CT ABDOMEN AND PELVIS WITHOUT CONTRAST TECHNIQUE: Multidetector CT imaging of the abdomen and pelvis was performed following the standard protocol without IV contrast. COMPARISON:  12/28/2014 and 02/03/2014 FINDINGS: Lung bases demonstrate very small bilateral pleural effusions with associated dependent atelectasis. Minimal cardiomegaly. The Abdominal images demonstrate the liver, spleen pancreas, gallbladder and adrenal glands to be within normal. Vascular structures are within normal. Kidneys are normal in size without hydronephrosis or nephrolithiasis. 1.3 cm exophytic cyst over the mid pole of the left kidney. Ureters are normal. Stomach is normal. Appendix is normal. Small bowel is within normal. There is mild diverticulosis of the colon. Pelvic images demonstrate the bladder, prostate and rectum to be within normal. Stable 1 cm sclerotic focus over the left sacrum likely a bone island. Old right lower anterior rib fracture. Focal  lucency along the left side of the T9 vertebral body with subtle patchy low density T7 and T8 not well seen previously and may represent evidence of patient's multiple myeloma. IMPRESSION: No acute findings in the abdomen/ pelvis. Subtle lucencies over the T7, T8 and T9 vertebral bodies not well  seen previously and likely due to patient's known multiple myeloma. No compression fracture. Small bilateral pleural effusions with associated basilar atelectasis. Mild diverticulosis of the colon. 1.3 cm left renal cyst. Electronically Signed   By: Marin Olp M.D.   On: 06/23/2015 17:33    Time Spent in minutes  30   Jani Gravel M.D on 07/05/2015 at 1:42 PM  Between 7am to 7pm - Pager - [phone]  After 7pm go to [email or web address] - password [facility]  [facility] -  Office  [phone]

## 2015-07-06 ENCOUNTER — Inpatient Hospital Stay (HOSPITAL_COMMUNITY): Payer: Medicaid Other

## 2015-07-06 ENCOUNTER — Ambulatory Visit
Admit: 2015-07-06 | Discharge: 2015-07-06 | Disposition: A | Discharge status: Home / Self Care | Payer: Self-pay | Attending: Radiation Oncology | Admitting: Radiation Oncology

## 2015-07-06 ENCOUNTER — Ambulatory Visit: Payer: Self-pay

## 2015-07-06 LAB — RENAL FUNCTION PANEL
ALBUMIN: 1.3 g/dL — AB (ref 3.5–5.0)
ANION GAP: 14 (ref 5–15)
Albumin: 1.3 g/dL — ABNORMAL LOW (ref 3.5–5.0)
Anion gap: 13 (ref 5–15)
BUN: 73 mg/dL — AB (ref 6–20)
BUN: 89 mg/dL — ABNORMAL HIGH (ref 6–20)
CHLORIDE: 97 mmol/L — AB (ref 101–111)
CO2: 23 mmol/L (ref 22–32)
CO2: 24 mmol/L (ref 22–32)
Calcium: 7.4 mg/dL — ABNORMAL LOW (ref 8.9–10.3)
Calcium: 7.7 mg/dL — ABNORMAL LOW (ref 8.9–10.3)
Chloride: 96 mmol/L — ABNORMAL LOW (ref 101–111)
Creatinine, Ser: 6.08 mg/dL — ABNORMAL HIGH (ref 0.61–1.24)
Creatinine, Ser: 6.87 mg/dL — ABNORMAL HIGH (ref 0.61–1.24)
GFR calc Af Amer: 10 mL/min — ABNORMAL LOW (ref >60)
GFR calc Af Amer: 12 mL/min — ABNORMAL LOW (ref >60)
GFR calc non Af Amer: 10 mL/min — ABNORMAL LOW (ref >60)
GFR calc non Af Amer: 9 mL/min — ABNORMAL LOW (ref >60)
GLUCOSE: 135 mg/dL — AB (ref 65–99)
GLUCOSE: 157 mg/dL — AB (ref 65–99)
PHOSPHORUS: 9.6 mg/dL — AB (ref 2.5–4.6)
POTASSIUM: 4.5 mmol/L (ref 3.5–5.1)
POTASSIUM: 4.8 mmol/L (ref 3.5–5.1)
Phosphorus: 8.4 mg/dL — ABNORMAL HIGH (ref 2.5–4.6)
Sodium: 133 mmol/L — ABNORMAL LOW (ref 135–145)
Sodium: 134 mmol/L — ABNORMAL LOW (ref 135–145)

## 2015-07-06 LAB — CBC
HEMATOCRIT: 24.7 % — AB (ref 39.0–52.0)
HEMOGLOBIN: 7.7 g/dL — AB (ref 13.0–17.0)
MCH: 29.5 pg (ref 26.0–34.0)
MCHC: 31.2 g/dL (ref 30.0–36.0)
MCV: 94.6 fL (ref 78.0–100.0)
Platelets: 416 10*3/uL — ABNORMAL HIGH (ref 150–400)
RBC: 2.61 MIL/uL — ABNORMAL LOW (ref 4.22–5.81)
RDW: 14.3 % (ref 11.5–15.5)
WBC: 14.2 10*3/uL — ABNORMAL HIGH (ref 4.0–10.5)

## 2015-07-06 MED ORDER — CALCIUM ACETATE (PHOS BINDER) 667 MG PO CAPS
1334.0000 mg | ORAL_CAPSULE | Freq: Three times a day (TID) | ORAL | Status: DC
  Administered 2015-07-06 – 2015-07-10 (×11): 1334 mg via ORAL
  Filled 2015-07-06 (×11): qty 2

## 2015-07-06 MED ORDER — PRO-STAT SUGAR FREE PO LIQD
30.0000 mL | Freq: Two times a day (BID) | ORAL | Status: DC
  Administered 2015-07-06 – 2015-07-10 (×9): 30 mL via ORAL
  Filled 2015-07-06 (×9): qty 30

## 2015-07-06 MED ORDER — RENA-VITE PO TABS
1.0000 | ORAL_TABLET | Freq: Every day | ORAL | Status: DC
  Administered 2015-07-06 – 2015-07-09 (×4): 1 via ORAL
  Filled 2015-07-06 (×4): qty 1

## 2015-07-06 NOTE — Progress Notes (Signed)
Spoke with pt and wife Plan is for left arm AV fistula Wednesday 6/7 Procedure details risk benefits discussed  Need to get IV out of left cephalic vein!!  Ruta Hinds, MD [facility] [place] Office: [phone] Pager: [phone]

## 2015-07-06 NOTE — Progress Notes (Signed)
[facility] Progress Note   Subjective:  "I'm doing well". No C/Os. Wife at bedside.   Objective Filed Vitals:   07/05/15 2130 07/06/15 0515 07/06/15 0733 07/06/15 0836  BP: 151/75 141/73  133/79  Pulse: 93 80 87 74  Temp: 98.6 F (37 C) 98.9 F (37.2 C)  98.5 F (36.9 C)  TempSrc: Oral   Oral  Resp: '18 18  20  '$ Height:      Weight:      SpO2: 90% 80% 97% 95%   Physical Exam General: pleasant, NAD Heart: S1, S2, RRR Lungs: BS CTA A/P Abdomen: Abdomin soft, nontender Extremities: trace LE edema Dialysis Access: R TDC Drsg CDI VVS consulted for permanent access.   Dialysis Orders:  Additional Objective Labs: Basic Metabolic Panel:  Recent Labs Lab 07/04/15 1321 07/05/15 0434 07/05/15 2355  NA 134* 137 134*  K 4.3 4.7 4.8  CL 101 99* 97*  CO2 '22 25 24  '$ GLUCOSE 93 103* 157*  BUN 85* 52* 73*  CREATININE 5.81* 4.66* 6.08*  CALCIUM 7.6* 7.7* 7.7*  PHOS 8.9*  --  8.4*   Liver Function Tests:  Recent Labs Lab 07/04/15 1321 07/05/15 0434 07/05/15 2355  AST  --  16  --   ALT  --  17  --   ALKPHOS  --  275*  --   BILITOT  --  0.5  --   PROT  --  5.3*  --   ALBUMIN 1.1* 1.2* 1.3*   No results for input(s): LIPASE, AMYLASE in the last 168 hours. CBC:  Recent Labs Lab 07/01/15 0327 07/02/15 0511 07/03/15 0859 07/04/15 1319 07/05/15 0434  WBC 18.1* 17.4* 15.0* 6.8 8.7  NEUTROABS 12.5* 14.3* 12.4*  --   --   HGB 7.8* 8.6* 7.7* 7.3* 7.3*  HCT 23.8* 26.3* 24.3* 23.4* 23.7*  MCV 94.1 93.9 95.3 94.4 95.2  PLT 454* 507* 432* 339 335   Blood Culture    Component Value Date/Time   SDES URINE, CLEAN CATCH 06/25/2015 2117   SPECREQUEST NONE 06/25/2015 2117   CULT NO GROWTH Performed at [facility]  06/25/2015 2117   REPTSTATUS 06/27/2015 FINAL 06/25/2015 2117    Cardiac Enzymes: No results for input(s): CKTOTAL, CKMB, CKMBINDEX, TROPONINI in the last 168 hours. CBG: No results for input(s): GLUCAP in the last 168  hours. Iron Studies:  Recent Labs  07/04/15 1319  IRON 56  TIBC 129*  FERRITIN 2488*   '@lablastinr3'$ @ Studies/Results: No results found. Medications:   . acyclovir  400 mg Oral QHS  . [START ON 07/13/2015] darbepoetin (ARANESP) injection - DIALYSIS  60 mcg Intravenous Q Mon-HD  . feeding supplement (ENSURE ENLIVE)  237 mL Oral TID BM  . heparin subcutaneous  5,000 Units Subcutaneous Q8H  . hydrALAZINE  25 mg Oral Q8H  . lenalidomide  10 mg Oral Q48H  . metoprolol tartrate  50 mg Oral BID  . mirtazapine  15 mg Oral QHS  . morphine  30 mg Oral Q12H  . polyethylene glycol  17 g Oral Daily  . senna-docusate  2 tablet Oral BID     Assessment/Plan: 1 Renal failure - w known myeloma and progressive renal failure last 5 mos. 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 New TDC and sp HD x 2. Started HD 07/03/15  VVS consult for permanent access this week. SW consulted for insurance assessment for OP HD.HD today. CLIP in progress.  2 Vol^ excess - Serial HD for volume reduction. 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 Start  Ca Acetate binders. No VDRA for now.  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 HGB 7.3 recheck  Today. Transfuse if less than 7.0. ESA orders written.  8. Nutrition: Albumin 1.3 renal diet-add prostat and renal vit  Junie Avilla H. Estel Tonelli NP-C 07/06/2015, 10:59 AM  [name] [phone]

## 2015-07-06 NOTE — Progress Notes (Addendum)
PROGRESS NOTE  Charles [name]  PYP:[id number] DOB: [birth year] DOA: 06/23/2015 PCP: [name], MD  Brief Narrative:   Charles [name] is a 40 y.o. male with medical history significant of Multiple Myeloma, CKD 3, back pain, currently undergoing chemotherapy, reports he was started on a new chemotherapeutic agent recently. Patient admitted with Nausea, vomiting, diarrhea. Thought to be to chemo related. Subsequently he developed Press syndrome, Malignant HTN, visual disturbabce and ?PNA. Neurology consulted. BP better. He spike fevers, chest x ray consistent with ?PNA. He was started on Vancomycin and cefepime. Vancomycin stopped 5-30 due to worsening renal function. Nephrology consulted. Also spike fever 5-29 after several days of broad spectrum abx, ID consulted. Fever is deemed to be likely tumor fever, patient is off abx for now  Mri spine showed MM progression and cord involvement, oncology has contacted IR. Worsening of renal function, nephrology recommended patient to be transferred from wl to [facility] for HD.  Assessment & Plan:   Principal Problem:   AKI (acute kidney injury) ([place]) Active Problems:   Multiple myeloma (HCC)   CKD (chronic kidney disease) stage 3, GFR 30-59 ml/min   Nausea with vomiting   Blurry vision, bilateral   HTN (hypertension), malignant   PRES (posterior reversible encephalopathy syndrome)   FUO (fever of unknown origin)   Back pain   Multiple myeloma not having achieved remission (HCC)   Multiple myeloma in relapse ([place])   Symptomatic anemia   Multiple myeloma ([place]) in relapse, started carfilzomib, cyclophosphamide, dexamethasone, cycle 1 started on 4/27, however, he did not complete a full cycle because he was intolerant of cyclophosphamide -  Cycle 2 started on 5/31, and lenalidomide was added.  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 - Receiving XRT with daily (M-F) treatments currently scheduled through 6/14 - Will try to arrange for XRT treatment for 6/6 if not receiving HD  AKI (acute kidney injury) ([place]), secondary to Myeloma Kidney, progressed to ESRD -  Started on HD on 6/2, 6/3 -  HD today #3 -  CLIP process   HTN Emergency, PRES syndrome, possibly due to side effect from protesome inhibitor, resolved Patient report vision changes the morning 5-26, MRI consistent with PRES Repeat MRI brain tomorrow off nicardipinegtt on 5/31 continue hydralazine 63m po tid, and metoprolol 533mpo bid SBP goal 120--140 Per neurology  Fevers likelysecondary to multiple myeloma, no clear pneumonia or UTI Received cefepime and vancomycin for 5 days.  WBC fluctuates. Last Spiked fever 5-30 Pan CT unrevealing, MRI spine no infection  ID signed off, appreciate input   Nausea with vomiting on admission, possibly due to chemotherapy -  Ct ab no obstruction. -  resolved   Anemia; secondary to MM;.  Received one unit 5-30. Keep hgb> 7.   Thyroid nodules: can follow up as oupatient  Back pain: stable, improved  DVT prophylaxis:  heparin Code Status:  Full code Family Communication:  Patient and wife Disposition Plan:  Pending CLIP and outpatient dialysis center established.  Plan for possible XRT on Wednesday at [facility] followed by chemotherapy on  Wednesday.  Chemotherapy on Thursday.  If still in hospital Monday, plan for fistula placement by Dr. Oneida Alar, otherwise, plan for outpatient fistula placement.  Plan to resume M-F XRT as outpatient.     Consultants:  Nephrology, oncology, neurology, Infectious disease, Vascular  surgery, Interventional radiology  Procedures:  HD tunneled catheter placement on 6/2  Antimicrobials:   5 day course of vancomycin and cefepime   Subjective: Has weakness and tremor in the right arm.  Drops things frequently  Gait stable.  Denies shortness of breath.  Has pain in the right shoulder from his HD catheter.    Objective: Filed Vitals:   07/06/15 1345 07/06/15 1400 07/06/15 1430 07/06/15 1456  BP: 151/82 135/90 137/81 146/75  Pulse: 71 68 65 63  Temp:      TempSrc:      Resp:      Height:      Weight:      SpO2:        Intake/Output Summary (Last 24 hours) at 07/06/15 1507 Last data filed at 07/06/15 1259  Gross per 24 hour  Intake    420 ml  Output      0 ml  Net    420 ml   Filed Weights   07/04/15 1230 07/04/15 1537 07/06/15 1337  Weight: 71.9 kg (158 lb 8.2 oz) 69.9 kg (154 lb 1.6 oz) 68.4 kg (150 lb 12.7 oz)    Examination:  General exam:  Adult male.  No acute distress.  HEENT:  NCAT, MMM Respiratory system: Clear to auscultation bilaterally Cardiovascular system: Regular rate and rhythm, normal S1/S2. No murmurs, rubs, gallops or clicks.  Warm extremities.  Right chest wall HD catheter with clean dressing, no surrounding erythema or induration.   Gastrointestinal system: Normal active bowel sounds, soft, nondistended, nontender. MSK:  Normal tone and bulk, trace bilateral lower extremity edema Neuro:  Tremor, dysmetria of the right hand, strength 4/5 right hand.  Left hand 5/5 and bilateral legs 5/5.      Data Reviewed: I have personally reviewed following labs and imaging studies  CBC:  Recent Labs Lab 06/30/15 0108  07/01/15 0327 07/02/15 0511 07/03/15 0859 07/04/15 1319 07/05/15 0434 07/06/15 1345  WBC 16.3*  --  18.1* 17.4* 15.0* 6.8 8.7 14.2*  NEUTROABS 12.2*  --  12.5* 14.3* 12.4*  --   --   --   HGB 6.8*  < > 7.8* 8.6* 7.7* 7.3* 7.3* 7.7*  HCT 21.1*  < > 23.8* 26.3* 24.3* 23.4* 23.7* 24.7*  MCV 93.8  --  94.1 93.9 95.3 94.4  95.2 94.6  PLT 413*  --  454* 507* 432* 339 335 416*  < > = values in this interval not displayed. Basic Metabolic Panel:  Recent Labs Lab 07/03/15 1842 07/04/15 1321 07/05/15 0434 07/05/15 2355 07/06/15 1345  NA 135 134* 137 134* 133*  K 5.2* 4.3 4.7 4.8 4.5  CL 105 101 99* 97* 96*  CO2 14* 22 25 24 23   GLUCOSE 93 93 103* 157* 135*  BUN 134* 85* 52* 73* 89*  CREATININE 7.56* 5.81* 4.66* 6.08* 6.87*  CALCIUM 7.7* 7.6* 7.7* 7.7* 7.4*  PHOS  --  8.9*  --  8.4* 9.6*   GFR: Estimated Creatinine Clearance: 11.9 mL/min (by C-G formula based on Cr of 6.87). Liver Function Tests:  Recent Labs Lab 07/04/15 1321 07/05/15 0434 07/05/15 2355 07/06/15 1345  AST  --  16  --   --   ALT  --  17  --   --  ALKPHOS  --  275*  --   --   BILITOT  --  0.5  --   --   PROT  --  5.3*  --   --   ALBUMIN 1.1* 1.2* 1.3* 1.3*   No results for input(s): LIPASE, AMYLASE in the last 168 hours. No results for input(s): AMMONIA in the last 168 hours. Coagulation Profile:  Recent Labs Lab 07/03/15 0859  INR 1.34   Cardiac Enzymes: No results for input(s): CKTOTAL, CKMB, CKMBINDEX, TROPONINI in the last 168 hours. BNP (last 3 results) No results for input(s): PROBNP in the last 8760 hours. HbA1C: No results for input(s): HGBA1C in the last 72 hours. CBG: No results for input(s): GLUCAP in the last 168 hours. Lipid Profile: No results for input(s): CHOL, HDL, LDLCALC, TRIG, CHOLHDL, LDLDIRECT in the last 72 hours. Thyroid Function Tests: No results for input(s): TSH, T4TOTAL, FREET4, T3FREE, THYROIDAB in the last 72 hours. Anemia Panel:  Recent Labs  07/04/15 1319  FERRITIN 2488*  TIBC 129*  IRON 56   Urine analysis:    Component Value Date/Time   COLORURINE RED* 06/23/2015 1430   APPEARANCEUR CLOUDY* 06/23/2015 1430   LABSPEC 1.013 06/23/2015 1430   LABSPEC 1.030 01/22/2015 1018   PHURINE 5.5 06/23/2015 1430   PHURINE 5.0 01/22/2015 1018   GLUCOSEU NEGATIVE 06/23/2015 1430     GLUCOSEU Negative 01/22/2015 1018   HGBUR LARGE* 06/23/2015 1430   HGBUR Large 01/22/2015 1018   BILIRUBINUR NEGATIVE 06/23/2015 1430   BILIRUBINUR Negative 01/22/2015 1018   KETONESUR NEGATIVE 06/23/2015 1430   KETONESUR Negative 01/22/2015 1018   PROTEINUR >300* 06/23/2015 1430   PROTEINUR 2000 01/22/2015 1018   UROBILINOGEN 0.2 01/22/2015 1018   UROBILINOGEN 0.2 01/31/2014 1050   NITRITE NEGATIVE 06/23/2015 1430   NITRITE Negative 01/22/2015 1018   LEUKOCYTESUR SMALL* 06/23/2015 1430   LEUKOCYTESUR Color Interference due to blood in urine 01/22/2015 1018   Sepsis Labs: @LABRCNTIP (procalcitonin:4,lacticidven:4)  ) Recent Results (from the past 240 hour(s))  MRSA PCR Screening     Status: None   Collection Time: 06/26/15  5:00 PM  Result Value Ref Range Status   MRSA by PCR NEGATIVE NEGATIVE Final    Comment:        The GeneXpert MRSA Assay (FDA approved for NASAL specimens only), is one component of a comprehensive MRSA colonization surveillance program. It is not intended to diagnose MRSA infection nor to guide or monitor treatment for MRSA infections.       Radiology Studies: No results found.   Scheduled Meds: . acyclovir  400 mg Oral QHS  . calcium acetate  1,334 mg Oral TID WC  . [START ON 07/13/2015] darbepoetin (ARANESP) injection - DIALYSIS  60 mcg Intravenous Q Mon-HD  . feeding supplement (ENSURE ENLIVE)  237 mL Oral TID BM  . feeding supplement (PRO-STAT SUGAR FREE 64)  30 mL Oral BID  . heparin subcutaneous  5,000 Units Subcutaneous Q8H  . hydrALAZINE  25 mg Oral Q8H  . lenalidomide  10 mg Oral Q48H  . metoprolol tartrate  50 mg Oral BID  . mirtazapine  15 mg Oral QHS  . morphine  30 mg Oral Q12H  . multivitamin  1 tablet Oral QHS  . polyethylene glycol  17 g Oral Daily  . senna-docusate  2 tablet Oral BID   Continuous Infusions:    LOS: 13 days    Time spent: 60 min, including time spent speaking the Medical Oncology, Nephrology,  Pharmacy, vascular surgery,  radiation oncology, and family regarding plan of care.    Janece Canterbury, MD [facility] Pager [phone]  If 7PM-7AM, please contact night-coverage [email or web address] Password [email or web address] 07/06/2015, 3:07 PM

## 2015-07-07 ENCOUNTER — Inpatient Hospital Stay (HOSPITAL_COMMUNITY): Payer: Medicaid Other

## 2015-07-07 ENCOUNTER — Ambulatory Visit: Payer: Self-pay

## 2015-07-07 ENCOUNTER — Other Ambulatory Visit: Payer: Self-pay

## 2015-07-07 ENCOUNTER — Ambulatory Visit
Admit: 2015-07-07 | Discharge: 2015-07-07 | Disposition: A | Discharge status: Home / Self Care | Payer: Self-pay | Attending: Radiation Oncology | Admitting: Radiation Oncology

## 2015-07-07 LAB — COMPREHENSIVE METABOLIC PANEL
ALBUMIN: 1.2 g/dL — AB (ref 3.5–5.0)
ALK PHOS: 203 U/L — AB (ref 38–126)
ALT: 14 U/L — AB (ref 17–63)
AST: 16 U/L (ref 15–41)
Anion gap: 10 (ref 5–15)
BILIRUBIN TOTAL: 1.2 mg/dL (ref 0.3–1.2)
BUN: 63 mg/dL — AB (ref 6–20)
CHLORIDE: 95 mmol/L — AB (ref 101–111)
CO2: 28 mmol/L (ref 22–32)
Calcium: 8.3 mg/dL — ABNORMAL LOW (ref 8.9–10.3)
Creatinine, Ser: 5.6 mg/dL — ABNORMAL HIGH (ref 0.61–1.24)
GFR calc Af Amer: 13 mL/min — ABNORMAL LOW (ref >60)
GFR calc non Af Amer: 12 mL/min — ABNORMAL LOW (ref >60)
Glucose, Bld: 97 mg/dL (ref 65–99)
POTASSIUM: 4.5 mmol/L (ref 3.5–5.1)
Sodium: 133 mmol/L — ABNORMAL LOW (ref 135–145)
Total Protein: 5 g/dL — ABNORMAL LOW (ref 6.5–8.1)

## 2015-07-07 LAB — CBC WITH DIFFERENTIAL/PLATELET
BASOS ABS: 0 10*3/uL (ref 0.0–0.1)
Basophils Relative: 0 %
EOS PCT: 2 %
Eosinophils Absolute: 0.2 10*3/uL (ref 0.0–0.7)
HEMATOCRIT: 24.2 % — AB (ref 39.0–52.0)
HEMOGLOBIN: 7.5 g/dL — AB (ref 13.0–17.0)
LYMPHS PCT: 17 %
Lymphs Abs: 2 10*3/uL (ref 0.7–4.0)
MCH: 30.5 pg (ref 26.0–34.0)
MCHC: 31 g/dL (ref 30.0–36.0)
MCV: 98.4 fL (ref 78.0–100.0)
MONOS PCT: 12 %
Monocytes Absolute: 1.4 10*3/uL — ABNORMAL HIGH (ref 0.1–1.0)
Neutro Abs: 7.9 10*3/uL — ABNORMAL HIGH (ref 1.7–7.7)
Neutrophils Relative %: 69 %
Platelets: 354 10*3/uL (ref 150–400)
RBC: 2.46 MIL/uL — AB (ref 4.22–5.81)
RDW: 14.2 % (ref 11.5–15.5)
WBC: 11.5 10*3/uL — AB (ref 4.0–10.5)

## 2015-07-07 LAB — URINE MICROSCOPIC-ADD ON

## 2015-07-07 LAB — URINALYSIS, ROUTINE W REFLEX MICROSCOPIC
GLUCOSE, UA: NEGATIVE mg/dL
KETONES UR: 15 mg/dL — AB
Nitrite: POSITIVE — AB
PH: 7 (ref 5.0–8.0)
Protein, ur: >300 mg/dL — AB
SPECIFIC GRAVITY, URINE: 1.014 (ref 1.005–1.030)

## 2015-07-07 LAB — VISCOSITY, SERUM: VISCOSITY, SERUM: 1.5 rel.saline — AB (ref 1.6–1.9)

## 2015-07-07 LAB — LACTIC ACID, PLASMA: LACTIC ACID, VENOUS: 0.8 mmol/L (ref 0.5–2.0)

## 2015-07-07 MED ORDER — DEXTROSE 5 % IV SOLN: 1.5000 g | INTRAVENOUS | Status: DC

## 2015-07-07 MED ORDER — NEPRO/CARBSTEADY PO LIQD
237.0000 mL | Freq: Two times a day (BID) | ORAL | Status: DC
  Administered 2015-07-07 – 2015-07-10 (×4): 237 mL via ORAL

## 2015-07-07 NOTE — Progress Notes (Signed)
PROGRESS NOTE  Charles [name]  LKG:[id number] DOB: 23-Feb-1975 DOA: 06/23/2015 PCP: [name], MD  Brief Narrative:   Charles [name] is a 40 y.o. male with medical history significant of Multiple Myeloma, CKD 3, back pain, currently undergoing chemotherapy, reports he was started on a new chemotherapeutic agent recently. Patient admitted with Nausea, vomiting, diarrhea. Thought to be to chemo related. Subsequently he developed Press syndrome, Malignant HTN, visual disturbabce and ?PNA. Neurology consulted. BP better. He spike fevers, chest x ray consistent with ?PNA. He was started on Vancomycin and cefepime. Vancomycin stopped 5-30 due to worsening renal function. Nephrology consulted. Also spike fever 5-29 after several days of broad spectrum abx, ID consulted. Fever is deemed to be likely tumor fever, patient is off abx for now  Mri spine showed MM progression and cord involvement, oncology has contacted IR. Worsening of renal function, nephrology recommended patient to be transferred from w[facility] to [facility] for HD.  Assessment & Plan:   Principal Problem:   AKI (acute kidney injury) ([place]) Active Problems:   Multiple myeloma (HCC)   CKD (chronic kidney disease) stage 3, GFR 30-59 ml/min   Nausea with vomiting   Blurry vision, bilateral   HTN (hypertension), malignant   PRES (posterior reversible encephalopathy syndrome)   FUO (fever of unknown origin)   Back pain   Multiple myeloma not having achieved remission (HCC)   Multiple myeloma in relapse ([place])   Symptomatic anemia   Multiple myeloma ([place]) in relapse, started carfilzomib, cyclophosphamide, dexamethasone, cycle 1 started on 4/27, however, he did not complete a full cycle because he was intolerant of cyclophosphamide -  Cycle 2 started on 5/31, and lenalidomide was added.  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 - Receiving XRT with daily (M-F) treatments currently scheduled through 6/14 - Will try to arrange for XRT treatment for 6/6 if not receiving HD  AKI (acute kidney injury) ([place]), secondary to Myeloma Kidney, progressed to ESRD -  Started on HD on 6/2, 6/3 -  CLIP process started  HTN Emergency, PRES syndrome, possibly due to side effect from protesome inhibitor, resolved Patient report vision changes the morning 5-26, MRI consistent with PRES Repeat MRI brain tomorrow off nicardipinegtt on 5/31 continue hydralazine 63m po tid, and metoprolol 52mpo bid SBP goal 120--140 Per neurology  Fevers likelysecondary to multiple myeloma, no clear pneumonia or UTI, higher fever today Received cefepime and vancomycin for 5 days, last dose on 5/30 Received pre-procedure ancef on 6/3 WBC fluctuates. Last Spiked fever 5-30 Pan CT unrevealing, MRI spine no infection  CXR and UA (if possible) Repeat CBC and lactic acid ID signed off, appreciate input   Nausea with vomiting on admission, possibly due to chemotherapy -  Ct ab no obstruction. -  resolved   Anemia; secondary to MM;.  Received one unit 5-30. Keep hgb> 7.   Thyroid nodules: can follow up as oupatient  Back pain: stable, improved  DVT prophylaxis:  heparin Code Status:  Full code Family Communication:  Patient and wife Disposition Plan:  Pending CLIP and outpatient dialysis center established.  Plan for possible XRT on Wednesday at [facility].  HD followed by chemotherapy on Wednesday.  Chemotherapy on Thursday.  If still in hospital Monday, plan for fistula placement by Dr. Oneida Alar, otherwise, plan for outpatient fistula placement.  Plan  to resume M-F XRT as outpatient.     Consultants:  Nephrology, oncology, neurology, Infectious disease, Vascular surgery, Interventional radiology  Procedures:  HD tunneled catheter placement on 6/2  Antimicrobials:   5 day course of vancomycin and cefepime   Subjective: Ongoing weakness and tremor in the right arm.  Drops things frequently  Gait stable.  Denies shortness of breath, not making urine and no abdominal pain.  Has pain in the right shoulder from his HD catheter.    Objective: Filed Vitals:   07/06/15 1801 07/06/15 2017 07/07/15 0504 07/07/15 0737  BP: 141/70 143/72 138/76 138/76  Pulse: 82 79 97 101  Temp: 97.9 F (36.6 C) 98.6 F (37 C) 100.2 F (37.9 C) 101.1 F (38.4 C)  TempSrc: Oral  Oral Oral  Resp: 18 18 18 18   Height:      Weight:  67.314 kg (148 lb 6.4 oz)    SpO2: 99% 94% 90% 95%    Intake/Output Summary (Last 24 hours) at 07/07/15 1722 Last data filed at 07/07/15 1425  Gross per 24 hour  Intake    240 ml  Output      0 ml  Net    240 ml   Filed Weights   07/06/15 1337 07/06/15 1707 07/06/15 2017  Weight: 68.4 kg (150 lb 12.7 oz) 66.1 kg (145 lb 11.6 oz) 67.314 kg (148 lb 6.4 oz)    Examination:  General exam:  Adult male.  No acute distress.  Diaphoretic and pale HEENT:  NCAT, MMM Respiratory system: Clear to auscultation bilaterally Cardiovascular system: Regular rate and rhythm, normal S1/S2. No murmurs, rubs, gallops or clicks.  Warm extremities.  Right chest wall HD catheter with clean dressing, no surrounding erythema or induration.   Gastrointestinal system: Normal active bowel sounds, soft, nondistended, nontender. MSK:  Normal tone and bulk, trace bilateral lower extremity edema Neuro:  Tremor, dysmetria of the right hand, strength 4/5 right hand.  Left hand 5/5 and bilateral legs 5/5.      Data Reviewed: I have personally reviewed following labs and imaging studies  CBC:  Recent Labs Lab 07/01/15 0327 07/02/15 0511  07/03/15 0859 07/04/15 1319 07/05/15 0434 07/06/15 1345  WBC 18.1* 17.4* 15.0* 6.8 8.7 14.2*  NEUTROABS 12.5* 14.3* 12.4*  --   --   --   HGB 7.8* 8.6* 7.7* 7.3* 7.3* 7.7*  HCT 23.8* 26.3* 24.3* 23.4* 23.7* 24.7*  MCV 94.1 93.9 95.3 94.4 95.2 94.6  PLT 454* 507* 432* 339 335 338*   Basic Metabolic Panel:  Recent Labs Lab 07/03/15 1842 07/04/15 1321 07/05/15 0434 07/05/15 2355 07/06/15 1345  NA 135 134* 137 134* 133*  K 5.2* 4.3 4.7 4.8 4.5  CL 105 101 99* 97* 96*  CO2 14* 22 25 24 23   GLUCOSE 93 93 103* 157* 135*  BUN 134* 85* 52* 73* 89*  CREATININE 7.56* 5.81* 4.66* 6.08* 6.87*  CALCIUM 7.7* 7.6* 7.7* 7.7* 7.4*  PHOS  --  8.9*  --  8.4* 9.6*   GFR: Estimated Creatinine Clearance: 11.8 mL/min (by C-G formula based on Cr of 6.87). Liver Function Tests:  Recent Labs Lab 07/04/15 1321 07/05/15 0434 07/05/15 2355 07/06/15 1345  AST  --  16  --   --  ALT  --  17  --   --   ALKPHOS  --  275*  --   --   BILITOT  --  0.5  --   --   PROT  --  5.3*  --   --   ALBUMIN 1.1* 1.2* 1.3* 1.3*   No results for input(s): LIPASE, AMYLASE in the last 168 hours. No results for input(s): AMMONIA in the last 168 hours. Coagulation Profile:  Recent Labs Lab 07/03/15 0859  INR 1.34   Cardiac Enzymes: No results for input(s): CKTOTAL, CKMB, CKMBINDEX, TROPONINI in the last 168 hours. BNP (last 3 results) No results for input(s): PROBNP in the last 8760 hours. HbA1C: No results for input(s): HGBA1C in the last 72 hours. CBG: No results for input(s): GLUCAP in the last 168 hours. Lipid Profile: No results for input(s): CHOL, HDL, LDLCALC, TRIG, CHOLHDL, LDLDIRECT in the last 72 hours. Thyroid Function Tests: No results for input(s): TSH, T4TOTAL, FREET4, T3FREE, THYROIDAB in the last 72 hours. Anemia Panel: No results for input(s): VITAMINB12, FOLATE, FERRITIN, TIBC, IRON, RETICCTPCT in the last 72 hours. Urine analysis:    Component Value Date/Time   COLORURINE RED*  06/23/2015 1430   APPEARANCEUR CLOUDY* 06/23/2015 1430   LABSPEC 1.013 06/23/2015 1430   LABSPEC 1.030 01/22/2015 1018   PHURINE 5.5 06/23/2015 1430   PHURINE 5.0 01/22/2015 1018   GLUCOSEU NEGATIVE 06/23/2015 1430   GLUCOSEU Negative 01/22/2015 1018   HGBUR LARGE* 06/23/2015 1430   HGBUR Large 01/22/2015 1018   BILIRUBINUR NEGATIVE 06/23/2015 1430   BILIRUBINUR Negative 01/22/2015 1018   KETONESUR NEGATIVE 06/23/2015 1430   KETONESUR Negative 01/22/2015 1018   PROTEINUR >300* 06/23/2015 1430   PROTEINUR 2000 01/22/2015 1018   UROBILINOGEN 0.2 01/22/2015 1018   UROBILINOGEN 0.2 01/31/2014 1050   NITRITE NEGATIVE 06/23/2015 1430   NITRITE Negative 01/22/2015 1018   LEUKOCYTESUR SMALL* 06/23/2015 1430   LEUKOCYTESUR Color Interference due to blood in urine 01/22/2015 1018   Sepsis Labs: @LABRCNTIP (procalcitonin:4,lacticidven:4)  ) No results found for this or any previous visit (from the past 240 hour(s)).    Radiology Studies: Mr Brain Wo Contrast  07/06/2015  CLINICAL DATA:  Multiple myeloma undergoing chemotherapy. Severe nausea and vomiting. Posterior reversible encephalopathy. Follow-up. EXAM: MRI HEAD WITHOUT CONTRAST TECHNIQUE: Multiplanar, multiecho pulse sequences of the brain and surrounding structures were obtained without intravenous contrast. COMPARISON:  06/26/2015 FINDINGS: Diffusion imaging does not show any acute or subacute infarction. There is resolving cortical and subcortical edema of the brain bilaterally in a posterior distribution. No evidence of hemorrhagic transformation. No evidence of hydrocephalus or extra-axial collection. Mucosal thickening and a small amount of fluid in the right maxillary sinus again noted. Multiple calvarial lesions unchanged since the previous study. IMPRESSION: Cortical and subcortical edema primarily affecting the posterior brain, improving when compared to the study of 06/26/2015, as expected with successful treatment/resolution of  posterior reversible encephalopathy. No evidence of infarctions or hemorrhagic change. Multiple myeloma skull lesions as seen previously. Electronically Signed   By: Nelson Chimes M.D.   On: 07/06/2015 20:16   Dg Chest Port 1 View  07/06/2015  CLINICAL DATA:  Status post dialysis EXAM: PORTABLE CHEST 1 VIEW COMPARISON:  06/25/2015 FINDINGS: Cardiac shadow is mildly enlarged. A new dialysis catheter is noted. Lungs are hypoinflated with mild basilar atelectasis. No focal confluent infiltrate is noted. Stable nodular changes are noted on the right related to pleural based lesions IMPRESSION: Mild bibasilar atelectasis. The remainder of the exam is relatively  stable from the prior study. Electronically Signed   By: Inez Catalina M.D.   On: 07/06/2015 18:17     Scheduled Meds: . acyclovir  400 mg Oral QHS  . calcium acetate  1,334 mg Oral TID WC  . [START ON 07/13/2015] darbepoetin (ARANESP) injection - DIALYSIS  60 mcg Intravenous Q Mon-HD  . feeding supplement (NEPRO CARB STEADY)  237 mL Oral BID BM  . feeding supplement (PRO-STAT SUGAR FREE 64)  30 mL Oral BID  . heparin subcutaneous  5,000 Units Subcutaneous Q8H  . hydrALAZINE  25 mg Oral Q8H  . lenalidomide  10 mg Oral Q48H  . metoprolol tartrate  50 mg Oral BID  . mirtazapine  15 mg Oral QHS  . morphine  30 mg Oral Q12H  . multivitamin  1 tablet Oral QHS  . polyethylene glycol  17 g Oral Daily  . senna-docusate  2 tablet Oral BID   Continuous Infusions:    LOS: 14 days    Time spent: 60 min, including time spent speaking the Medical Oncology, Nephrology, Pharmacy, vascular surgery, radiation oncology, and family regarding plan of care.    Janece Canterbury, MD [facility] Pager [phone]  If 7PM-7AM, please contact night-coverage [email or web address] Password [email or web address] 07/07/2015, 5:22 PM

## 2015-07-07 NOTE — Progress Notes (Signed)
Due to conflicts with chemotherapy treatments pt access will be scheduled for next Monday June 12  Ruta Hinds, MD [facility]: [phone] Pager: [phone]

## 2015-07-07 NOTE — Progress Notes (Signed)
[facility] Progress Note  Subjective:  "I'm doing OK". Wife at bedside-no C/Os. AVF placement has been scheduled for tomorrow but patient is to have chemotherapy Wednesday and Thursday. I have called Dr. Oneida Alar office to ask that this be delayed until pt is OP.   Objective Filed Vitals:   07/06/15 1801 07/06/15 2017 07/07/15 0504 07/07/15 0737  BP: 141/70 143/72 138/76 138/76  Pulse: 82 79 97 101  Temp: 97.9 F (36.6 C) 98.6 F (37 C) 100.2 F (37.9 C) 101.1 F (38.4 C)  TempSrc: Oral  Oral Oral  Resp: 18 18 18 18   Height:      Weight:  67.314 kg (148 lb 6.4 oz)    SpO2: 99% 94% 90% 95%   Physical Exam General: pleasant, NAD Heart: S1, S2, RRR I/VI systolic M 3ICS LSB.  Lungs: BS CTA A/P Abdomen: Abdomin soft, nontender Extremities: trace LE edema Dialysis Access: R [facility] CDI    Additional Objective Labs: Basic Metabolic Panel:  Recent Labs Lab 07/04/15 1321 07/05/15 0434 07/05/15 2355 07/06/15 1345  NA 134* 137 134* 133*  K 4.3 4.7 4.8 4.5  CL 101 99* 97* 96*  CO2 22 25 24 23   GLUCOSE 93 103* 157* 135*  BUN 85* 52* 73* 89*  CREATININE 5.81* 4.66* 6.08* 6.87*  CALCIUM 7.6* 7.7* 7.7* 7.4*  PHOS 8.9*  --  8.4* 9.6*   Liver Function Tests:  Recent Labs Lab 07/05/15 0434 07/05/15 2355 07/06/15 1345  AST 16  --   --   ALT 17  --   --   ALKPHOS 275*  --   --   BILITOT 0.5  --   --   PROT 5.3*  --   --   ALBUMIN 1.2* 1.3* 1.3*   CBC:  Recent Labs Lab 07/01/15 0327 07/02/15 0511 07/03/15 0859 07/04/15 1319 07/05/15 0434 07/06/15 1345  WBC 18.1* 17.4* 15.0* 6.8 8.7 14.2*  NEUTROABS 12.5* 14.3* 12.4*  --   --   --   HGB 7.8* 8.6* 7.7* 7.3* 7.3* 7.7*  HCT 23.8* 26.3* 24.3* 23.4* 23.7* 24.7*  MCV 94.1 93.9 95.3 94.4 95.2 94.6  PLT 454* 507* 432* 339 335 416*   Blood Culture    Component Value Date/Time   SDES URINE, CLEAN CATCH 06/25/2015 2117   SPECREQUEST NONE 06/25/2015 2117   CULT NO GROWTH Performed at [facility]  06/25/2015 2117   REPTSTATUS 06/27/2015 FINAL 06/25/2015 2117    CBG: No results for input(s): GLUCAP in the last 168 hours. Iron Studies:  Recent Labs  07/04/15 1319  IRON 56  TIBC 129*  FERRITIN 2488*   @lablastinr3 @ Studies/Results: Mr Brain Wo Contrast  07/06/2015  CLINICAL DATA:  Multiple myeloma undergoing chemotherapy. Severe nausea and vomiting. Posterior reversible encephalopathy. Follow-up. EXAM: MRI HEAD WITHOUT CONTRAST TECHNIQUE: Multiplanar, multiecho pulse sequences of the brain and surrounding structures were obtained without intravenous contrast. COMPARISON:  06/26/2015 FINDINGS: Diffusion imaging does not show any acute or subacute infarction. There is resolving cortical and subcortical edema of the brain bilaterally in a posterior distribution. No evidence of hemorrhagic transformation. No evidence of hydrocephalus or extra-axial collection. Mucosal thickening and a small amount of fluid in the right maxillary sinus again noted. Multiple calvarial lesions unchanged since the previous study. IMPRESSION: Cortical and subcortical edema primarily affecting the posterior brain, improving when compared to the study of 06/26/2015, as expected with successful treatment/resolution of posterior reversible encephalopathy. No evidence of infarctions or hemorrhagic change. Multiple myeloma  skull lesions as seen previously. Electronically Signed   By: Nelson Chimes M.D.   On: 07/06/2015 20:16   Dg Chest Port 1 View  07/06/2015  CLINICAL DATA:  Status post dialysis EXAM: PORTABLE CHEST 1 VIEW COMPARISON:  06/25/2015 FINDINGS: Cardiac shadow is mildly enlarged. A new dialysis catheter is noted. Lungs are hypoinflated with mild basilar atelectasis. No focal confluent infiltrate is noted. Stable nodular changes are noted on the right related to pleural based lesions IMPRESSION: Mild bibasilar atelectasis. The remainder of the exam is relatively stable from the prior study. Electronically  Signed   By: Inez Catalina M.D.   On: 07/06/2015 18:17   Medications:   . acyclovir  400 mg Oral QHS  . calcium acetate  1,334 mg Oral TID WC  . [START ON 07/13/2015] darbepoetin (ARANESP) injection - DIALYSIS  60 mcg Intravenous Q Mon-HD  . feeding supplement (ENSURE ENLIVE)  237 mL Oral TID BM  . feeding supplement (PRO-STAT SUGAR FREE 64)  30 mL Oral BID  . heparin subcutaneous  5,000 Units Subcutaneous Q8H  . hydrALAZINE  25 mg Oral Q8H  . lenalidomide  10 mg Oral Q48H  . metoprolol tartrate  50 mg Oral BID  . mirtazapine  15 mg Oral QHS  . morphine  30 mg Oral Q12H  . multivitamin  1 tablet Oral QHS  . polyethylene glycol  17 g Oral Daily  . senna-docusate  2 tablet Oral BID    Assessment/Plan: 1 Renal failure - w known myeloma and progressive renal failure last 5 mos. 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 New TDC and sp HD x 2. Started HD 07/03/15 [facility] consult for permanent access this week however this needs to be scheduled as OP. SW consulted for insurance assessment for OP HD.HD today. CLIP in progress.  2 Vol^ excess - Serial HD for volume reduction-patient had 3rd HD treatment yesterday. Net UF 2000 Post wt 66.1 kg. Will have HD 1st thing in AM Attempt 2 liters.  3 Mult myeloma - got carfilzomib 2x on 5/31and 6/1 last week, now back on maint lenalidomide 4 PRES syndrome - resolved 5 HTN urgency - resolved, on po hydral/ MTP, Better control of BP.  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 Start Ca Acetate binders. No VDRA for now.  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 HGB 7.7 yesterday-no transfusion needed yet. Recheck in AM.  Rec'd ESA dose 07/06/15.   8. Nutrition: Albumin 1.3 renal diet-add prostat and renal vit  Verdell Dykman H. Sharlotte Baka NP-C 07/06/2015, 10:59 AM  [name] [phone]

## 2015-07-07 NOTE — Progress Notes (Signed)
Nutrition Follow-up  DOCUMENTATION CODES:   Not applicable  INTERVENTION:  Provide Nepro Shake po BID, each supplement provides 425 kcal and 19 grams protein.  Continue 30 ml Prostat po BID, each supplement provides 100 kcal and 15 grams of protein.   Encourage adequate PO intake.   NUTRITION DIAGNOSIS:   Increased nutrient needs related to cancer and cancer related treatments as evidenced by estimated needs; ongoing  GOAL:   Patient will meet greater than or equal to 90% of their needs; met  MONITOR:   PO intake, Supplement acceptance, Labs, Weight trends, Skin, I & O's  REASON FOR ASSESSMENT:   Malnutrition Screening Tool    ASSESSMENT:   Pt with medical history significant of Multiple Myeloma, CKD 3, back pain, currently undergoing chemotherapy, reports he was started on a new chemotherapeutic agent recently, last chemo was last week, went to the cancer center with Nausea, Vomiting, diarrhea, fever and chills x4days and was sent to the ER. 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 HD started 6/2.   Meal completion has been varied from 10-100%. Pt currently has Ensure and Prostat ordered and has been consuming them. Noted, phosphorous has been elevated. RD to order Nepro Shake instead of the Ensure. Will continue with Prostat. RD to continue to monitor.   Phosphorous elevated at 9.6.  Diet Order:  Diet renal with fluid restriction Fluid restriction:: 1200 mL Fluid; Room service appropriate?: Yes; Fluid consistency:: Thin  Skin:  Reviewed, no issues  Last BM:  6/5  Height:   Ht Readings from Last 1 Encounters:  07/02/15 5' 1"  (1.549 m)    Weight:   Wt Readings from Last 1 Encounters:  07/06/15 148 lb 6.4 oz (67.314 kg)    Ideal Body Weight:  53.64 kg (kg)  BMI:  Body mass index is 28.05 kg/(m^2).  Estimated Nutritional Needs:   Kcal:  2000-2200  Protein:  100-110  Fluid:  1.2 L/day  EDUCATION NEEDS:   No education needs identified at this  time  Corrin Parker, MS, RD, LDN Pager # [phone] After hours/ weekend pager # [phone]

## 2015-07-07 NOTE — Progress Notes (Signed)
Interpreter Graciela Namihira for patient °

## 2015-07-08 ENCOUNTER — Ambulatory Visit: Payer: Self-pay

## 2015-07-08 ENCOUNTER — Other Ambulatory Visit: Payer: Self-pay

## 2015-07-08 ENCOUNTER — Ambulatory Visit
Admit: 2015-07-08 | Discharge: 2015-07-08 | Disposition: A | Discharge status: Home / Self Care | Payer: Self-pay | Attending: Radiation Oncology | Admitting: Radiation Oncology

## 2015-07-08 ENCOUNTER — Inpatient Hospital Stay (HOSPITAL_COMMUNITY): Payer: Medicaid Other

## 2015-07-08 ENCOUNTER — Ambulatory Visit: Payer: Self-pay | Admitting: Nurse Practitioner

## 2015-07-08 DIAGNOSIS — R29898 Other symptoms and signs involving the musculoskeletal system: Secondary | ICD-10-CM

## 2015-07-08 LAB — RENAL FUNCTION PANEL
Albumin: 1.1 g/dL — ABNORMAL LOW (ref 3.5–5.0)
Anion gap: 14 (ref 5–15)
BUN: 75 mg/dL — ABNORMAL HIGH (ref 6–20)
CO2: 25 mmol/L (ref 22–32)
Calcium: 8.4 mg/dL — ABNORMAL LOW (ref 8.9–10.3)
Chloride: 94 mmol/L — ABNORMAL LOW (ref 101–111)
Creatinine, Ser: 6.51 mg/dL — ABNORMAL HIGH (ref 0.61–1.24)
GFR calc Af Amer: 11 mL/min — ABNORMAL LOW (ref >60)
GFR calc non Af Amer: 10 mL/min — ABNORMAL LOW (ref >60)
Glucose, Bld: 83 mg/dL (ref 65–99)
Phosphorus: 8.4 mg/dL — ABNORMAL HIGH (ref 2.5–4.6)
Potassium: 4.2 mmol/L (ref 3.5–5.1)
Sodium: 133 mmol/L — ABNORMAL LOW (ref 135–145)

## 2015-07-08 LAB — CBC
HCT: 23.5 % — ABNORMAL LOW (ref 39.0–52.0)
Hemoglobin: 7.1 g/dL — ABNORMAL LOW (ref 13.0–17.0)
MCH: 29.1 pg (ref 26.0–34.0)
MCHC: 30.2 g/dL (ref 30.0–36.0)
MCV: 96.3 fL (ref 78.0–100.0)
Platelets: 359 10*3/uL (ref 150–400)
RBC: 2.44 MIL/uL — ABNORMAL LOW (ref 4.22–5.81)
RDW: 14.2 % (ref 11.5–15.5)
WBC: 11.7 K/uL — ABNORMAL HIGH (ref 4.0–10.5)

## 2015-07-08 LAB — PREPARE RBC (CROSSMATCH)

## 2015-07-08 LAB — ABO/RH: ABO/RH(D): O POS

## 2015-07-08 MED ORDER — LIDOCAINE HCL (PF) 1 % IJ SOLN: 5.0000 mL | INTRAMUSCULAR | Status: DC | PRN

## 2015-07-08 MED ORDER — SODIUM CHLORIDE 0.9 % IV SOLN
Freq: Once | INTRAVENOUS | Status: AC
  Administered 2015-07-08: 15:00:00 via INTRAVENOUS

## 2015-07-08 MED ORDER — SODIUM CHLORIDE 0.9 % IV SOLN
Freq: Once | INTRAVENOUS | Status: AC
  Administered 2015-07-08: 14:00:00 via INTRAVENOUS

## 2015-07-08 MED ORDER — EPINEPHRINE HCL 1 MG/ML IJ SOLN
0.5000 mg | Freq: Once | INTRAMUSCULAR | Status: DC | PRN
  Filled 2015-07-08: qty 1

## 2015-07-08 MED ORDER — SODIUM CHLORIDE 0.9% FLUSH: 10.0000 mL | INTRAVENOUS | Status: DC | PRN

## 2015-07-08 MED ORDER — FAMOTIDINE IN NACL 20-0.9 MG/50ML-% IV SOLN
20.0000 mg | Freq: Once | INTRAVENOUS | Status: DC | PRN
Start: 2015-07-08 — End: 2015-07-10
  Filled 2015-07-08: qty 50

## 2015-07-08 MED ORDER — SODIUM CHLORIDE 0.9% FLUSH: 3.0000 mL | INTRAVENOUS | Status: DC | PRN

## 2015-07-08 MED ORDER — HEPARIN SOD (PORK) LOCK FLUSH 100 UNIT/ML IV SOLN
250.0000 [IU] | Freq: Once | INTRAVENOUS | Status: DC | PRN
  Filled 2015-07-08: qty 3

## 2015-07-08 MED ORDER — DIPHENHYDRAMINE HCL 50 MG/ML IJ SOLN: 50.0000 mg | Freq: Once | INTRAMUSCULAR | Status: DC | PRN

## 2015-07-08 MED ORDER — ALTEPLASE 2 MG IJ SOLR: 2.0000 mg | Freq: Once | INTRAMUSCULAR | Status: DC | PRN

## 2015-07-08 MED ORDER — PENTAFLUOROPROP-TETRAFLUOROETH EX AERO: 1.0000 "application " | INHALATION_SPRAY | CUTANEOUS | Status: DC | PRN

## 2015-07-08 MED ORDER — HEPARIN SODIUM (PORCINE) 1000 UNIT/ML DIALYSIS: 1000.0000 [IU] | INTRAMUSCULAR | Status: DC | PRN

## 2015-07-08 MED ORDER — DIPHENHYDRAMINE HCL 50 MG/ML IJ SOLN: 25.0000 mg | Freq: Once | INTRAMUSCULAR | Status: DC | PRN

## 2015-07-08 MED ORDER — SODIUM CHLORIDE 0.9 % IV SOLN: 100.0000 mL | INTRAVENOUS | Status: DC | PRN

## 2015-07-08 MED ORDER — ALBUTEROL SULFATE (2.5 MG/3ML) 0.083% IN NEBU: 2.5000 mg | INHALATION_SOLUTION | Freq: Once | RESPIRATORY_TRACT | Status: DC | PRN

## 2015-07-08 MED ORDER — SODIUM CHLORIDE 0.9 % IV SOLN
20.0000 mg | Freq: Once | INTRAVENOUS | Status: AC
Start: 2015-07-08 — End: 2015-07-08
  Administered 2015-07-08: 20 mg via INTRAVENOUS
  Filled 2015-07-08: qty 2

## 2015-07-08 MED ORDER — LIDOCAINE-PRILOCAINE 2.5-2.5 % EX CREA: 1.0000 "application " | TOPICAL_CREAM | CUTANEOUS | Status: DC | PRN

## 2015-07-08 MED ORDER — EPINEPHRINE HCL 0.1 MG/ML IJ SOSY
0.2500 mg | PREFILLED_SYRINGE | Freq: Once | INTRAMUSCULAR | Status: DC | PRN
Start: 2015-07-08 — End: 2015-07-10

## 2015-07-08 MED ORDER — SODIUM CHLORIDE 0.9 % IV SOLN: Freq: Once | INTRAVENOUS | Status: DC

## 2015-07-08 MED ORDER — SODIUM CHLORIDE 0.9 % IV SOLN: Freq: Once | INTRAVENOUS | Status: DC | PRN

## 2015-07-08 MED ORDER — PROCHLORPERAZINE MALEATE 10 MG PO TABS
10.0000 mg | ORAL_TABLET | Freq: Once | ORAL | Status: AC
  Administered 2015-07-08: 10 mg via ORAL
  Filled 2015-07-08 (×2): qty 1

## 2015-07-08 MED ORDER — DEXTROSE 5 % IV SOLN
27.0000 mg/m2 | Freq: Once | INTRAVENOUS | Status: AC
  Administered 2015-07-08: 46 mg via INTRAVENOUS
  Filled 2015-07-08: qty 23

## 2015-07-08 MED ORDER — HEPARIN SOD (PORK) LOCK FLUSH 100 UNIT/ML IV SOLN
500.0000 [IU] | Freq: Once | INTRAVENOUS | Status: DC | PRN
  Filled 2015-07-08: qty 5

## 2015-07-08 MED ORDER — METHYLPREDNISOLONE SODIUM SUCC 125 MG IJ SOLR: 125.0000 mg | Freq: Once | INTRAMUSCULAR | Status: DC | PRN

## 2015-07-08 MED ORDER — EPINEPHRINE HCL 0.1 MG/ML IJ SOSY: 0.2500 mg | PREFILLED_SYRINGE | Freq: Once | INTRAMUSCULAR | Status: DC | PRN

## 2015-07-08 NOTE — Procedures (Signed)
I was present at this dialysis session. I have reviewed the session itself and made appropriate changes.   CLIP complete to [facility]. Can Start 6/13, must present 6/12 for paperwork but [facility] scheduled AVF is 6/12 -- will need to address -- perhaps he can do paperwork on 6/13; we will call to arrange.  To [facility] after HD today for CTX.   AVF scheduled 6/12 as outpatient, unable to accomondate as inpatient  Hb 7.1 this AM, Transfuse 2u with HD.    Recent Labs Lab 07/08/15 0716  NA 133*  K 4.2  CL 94*  CO2 25  GLUCOSE 83  BUN 75*  CREATININE 6.51*  CALCIUM 8.4*  PHOS 8.4*     Recent Labs Lab 07/02/15 0511 07/03/15 0859  07/06/15 1345 07/07/15 1815 07/08/15 0716  WBC 17.4* 15.0*  < > 14.2* 11.5* 11.7*  NEUTROABS 14.3* 12.4*  --   --  7.9*  --   HGB 8.6* 7.7*  < > 7.7* 7.5* 7.1*  HCT 26.3* 24.3*  < > 24.7* 24.2* 23.5*  MCV 93.9 95.3  < > 94.6 98.4 96.3  PLT 507* 432*  < > 416* 354 359  < > = values in this interval not displayed.  Scheduled Meds: . sodium chloride   Intravenous Once  . acyclovir  400 mg Oral QHS  . calcium acetate  1,334 mg Oral TID WC  . [START ON 07/13/2015] darbepoetin (ARANESP) injection - DIALYSIS  60 mcg Intravenous Q Mon-HD  . feeding supplement (NEPRO CARB STEADY)  237 mL Oral BID BM  . feeding supplement (PRO-STAT SUGAR FREE 64)  30 mL Oral BID  . heparin subcutaneous  5,000 Units Subcutaneous Q8H  . hydrALAZINE  25 mg Oral Q8H  . lenalidomide  10 mg Oral Q48H  . metoprolol tartrate  50 mg Oral BID  . mirtazapine  15 mg Oral QHS  . morphine  30 mg Oral Q12H  . multivitamin  1 tablet Oral QHS  . polyethylene glycol  17 g Oral Daily  . senna-docusate  2 tablet Oral BID   Continuous Infusions:  PRN Meds:.sodium chloride, sodium chloride, sodium chloride, acetaminophen, albuterol, albuterol, alteplase, diatrizoate meglumine-sodium, famotidine, heparin, HYDROcodone-acetaminophen, lidocaine (PF), lidocaine-prilocaine,  pentafluoroprop-tetrafluoroeth, prochlorperazine, sodium chloride flush, sodium chloride flush   Pearson Grippe  MD 07/08/2015, 8:33 AM

## 2015-07-08 NOTE — Progress Notes (Signed)
Chemotherapy infusion completed. Vital signs remained stable throughout. Patient denied any shortness of breath or other symptoms. IV site patency verified before, during, and after infusion with good blood return noted. Family at bedside. Chemotherapy precautions reviewed with staff.

## 2015-07-08 NOTE — Progress Notes (Signed)
Manual calculation of BSA and dosing for carfilzomib completed. Verified independently  by Aldean Baker RN and Jena Gauss RN

## 2015-07-08 NOTE — Progress Notes (Signed)
Interpreter Lesle Chris cfor RN Jenny Reichmann

## 2015-07-08 NOTE — Progress Notes (Addendum)
07/08/2015 9:48 AM Hemodialysis Outpatient Note; this patient has been accepted at the [facility] dialysis center on a Tuesday, Thursday and Saturday 1st shift schedule. The center can begin treatment Tuesday June 13th at 7:00AM. Mr Charles [name] needs to visit the center on Monday June 12th at 11:30 AM with an interpreter to sign paperwork and consents. Thank you. Gordy Savers  07/08/2015 4:01 PM Hemodialysis Outpatient update; the above instructions have changed to Mr Charles [name] arriving at the [facility] center on Tuesday June 13th at 11:00AM to sign paperwork and consents then begin treatment. His normal chair time should be 12:20PM thereafter. The patient and his family have been advised of the change. Thank you. Gordy Savers

## 2015-07-08 NOTE — Progress Notes (Signed)
PROGRESS NOTE  Charles [name]  AST:[id number] DOB: 1975/12/29 DOA: 06/23/2015 PCP: [name], MD  Brief Narrative:   Charles [name] is a 40 y.o. male with medical history significant of Multiple Myeloma, CKD 3, back pain, currently undergoing chemotherapy, reports he was started on a new chemotherapeutic agent recently. Patient admitted with Nausea, vomiting, diarrhea. Thought to be to chemo related. Subsequently he developed PRES syndrome, Malignant HTN, visual disturbance and possible PNA. Neurology consulted. He was started on Vancomycin and cefepime.  Due to ongoing fevers, ID consulted. Fever was deemed to be tumor fever, and antibiotics were stopped.  He developed myeloma kidney with ARF and he started hemodialysis.  He was transferred to [facility] for [facility] for this reason.  He has completed several treatments of HD and CLIP process has been completed.  He will start hemodialysis on Tuesday next week as an outpatient.  Also, Mri spine showed MM progression and cord involvement and he will undergo palliative XRT as an outpatient.  Plan is to give chemotherapy on 6/7 and 6/8 at [facility].  Infusion RN from [facility] comes to [facility] to administer the dose.  He will need HD either Friday or Saturday and barring any complications can be discharged over the weekend.    Assessment & Plan:   Principal Problem:   AKI (acute kidney injury) ([place]) Active Problems:   Multiple myeloma (HCC)   CKD (chronic kidney disease) stage 3, GFR 30-59 ml/min   Nausea with vomiting   Blurry vision, bilateral   HTN (hypertension), malignant   PRES (posterior reversible encephalopathy syndrome)   FUO (fever of unknown origin)   Back pain   Multiple myeloma not having achieved remission (HCC)   Multiple myeloma in relapse ([place])   Symptomatic anemia   Multiple myeloma ([place]) in relapse, started carfilzomib, cyclophosphamide, dexamethasone, cycle 1 started on 4/27, however, he did not complete a  full cycle because he was intolerant of cyclophosphamide.  Oncologist feels that he may have a response to this chemotherapy, but given his degree of illness with ongoing fevers, need for dialysis, diminished appetite, his prognosis is guarded.   -  Cycle 2 started on 5/31, and lenalidomide was added.  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    ESRD, secondary to Myeloma Kidney -  Started HD on 6/2 -  HD center arranged, patient to arrive on Monday to complete paperwork -  Plan for HD on Saturday, then discharge -  He will need to go to HD   HTN Emergency, PRES syndrome, possibly due to side effect from protesome inhibitor, resolved Patient report vision changes the morning 5-26, MRI consistent with PRES MRI brain demonstrates improvement in edema without hemorrhage or infarct off nicardipinegtt on 5/31 continue hydralazine 27m po tid, and metoprolol 551mpo bid SBP goal 120--140 Per neurology  Fevers likelysecondary to multiple myeloma, no clear pneumonia or UTI.  Pan CT unrevealing, MRI spine no infection  Spike fever again on 6/6:  CXR unremarkable and UA with  leukocytes and nitrites but difficult to interpret in setting of ESRD - f/u urine culture and hold on abx for now ID signed off   Nausea with vomiting on admission, possibly due to chemotherapy -  Ct ab no obstruction. -  resolved   Right arm weakness, may be secondary to recent PRES, but has spinal cord involvement in other areas.   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  See above  DVT prophylaxis:  heparin Code Status:  Full code Family Communication:  Patient.  Patient has a wife and 4 or 5 children.  He has been out of work for the last year and half after starting chemotherapy and his wife cares for him.  Their two oldest children are in their early 28s.  They work and support the family.  They have several young children, youngest is 84-years old.   Disposition Plan:  COMPLICATION:  He needs his outpatient HD sessions to be FIRST SHIFT because he will need to go AFTER hemodialysis to get XRT and chemotherapy at the [place] at least one day a week.  He was appropriately scheduled for 7am, but a note this afternoon states they changed his time to 11AM.  This will need to be addressed before he can be discharged.    Consultants:  Nephrology, oncology, neurology, Infectious disease, Vascular surgery, Interventional radiology  Procedures:  HD tunneled catheter placement on 6/2  Antimicrobials:   5 day course of vancomycin and cefepime   Subjective: Spanish interpret:  Ongoing weakness and tremor in the right arm.  Drops things frequently.  Having hallucinations, seeing people that died from his cancer, but wants to fight.    Objective: Filed Vitals:   07/08/15 1545 07/08/15 1603 07/08/15 1615 07/08/15 1646  BP: 120/67 122/59 120/66 123/69  Pulse: 83 81 84 76  Temp: 98.3 F (36.8 C) 99 F (37.2 C) 98.3 F (36.8 C) 98.2 F (36.8 C)  TempSrc: Oral Oral Oral Oral  Resp: 20 18 16 16   Height:      Weight:      SpO2: 95% 95% 93% 95%    Intake/Output Summary (Last 24 hours) at 07/08/15 1834 Last data filed at 07/08/15 1646  Gross per 24 hour  Intake    455 ml  Output   2400 ml  Net   -1945 ml   Filed Weights   07/07/15 2102 07/08/15 0640 07/08/15 1040  Weight: 67.495 kg (148 lb 12.8 oz) 66.1 kg (145 lb 11.6 oz) 64 kg (141 lb 1.5 oz)    Examination:  General exam:  Adult male.  No acute distress.  Diaphoretic and pale, seen in HD HEENT:  NCAT, MMM Respiratory system: Clear to auscultation bilaterally Cardiovascular system: Regular rate and rhythm, normal S1/S2. No murmurs, rubs, gallops or clicks.  Warm extremities.  Right chest wall HD catheter with clean dressing, no surrounding erythema or induration.   Gastrointestinal system: Normal active bowel sounds, soft, nondistended, nontender. MSK:  Normal tone and bulk, 1+ bilateral lower extremity edema Neuro:  Tremor, dysmetria of the right hand, strength 4/5 right hand.  Left hand 5/5 and bilateral legs 5/5.      Data Reviewed: I have personally reviewed following labs and imaging studies  CBC:  Recent Labs Lab 07/02/15  8768 07/03/15 0859 07/04/15 1319 07/05/15 0434 07/06/15 1345 07/07/15 1815 07/08/15 0716  WBC 17.4* 15.0* 6.8 8.7 14.2* 11.5* 11.7*  NEUTROABS 14.3* 12.4*  --   --   --  7.9*  --   HGB 8.6* 7.7* 7.3* 7.3* 7.7* 7.5* 7.1*  HCT 26.3* 24.3* 23.4* 23.7* 24.7* 24.2* 23.5*  MCV 93.9 95.3 94.4 95.2 94.6 98.4 96.3  PLT 507* 432* 339 335 416* 354 115   Basic Metabolic Panel:  Recent Labs Lab 07/04/15 1321 07/05/15 0434 07/05/15 2355 07/06/15 1345 07/07/15 1815 07/08/15 0716  NA 134* 137 134* 133* 133* 133*  K 4.3 4.7 4.8 4.5 4.5 4.2  CL 101 99* 97* 96* 95* 94*  CO2 22 25 24 23 28 25   GLUCOSE 93 103* 157* 135* 97 83  BUN 85* 52* 73* 89* 63* 75*  CREATININE 5.81* 4.66* 6.08* 6.87* 5.60* 6.51*  CALCIUM 7.6* 7.7* 7.7* 7.4* 8.3* 8.4*  PHOS 8.9*  --  8.4* 9.6*  --  8.4*   GFR: Estimated Creatinine Clearance: 12.2 mL/min (by C-G formula based on Cr of 6.51). Liver Function Tests:  Recent Labs Lab 07/05/15 0434 07/05/15 2355 07/06/15 1345 07/07/15 1815 07/08/15 0716  AST 16  --    --  16  --   ALT 17  --   --  14*  --   ALKPHOS 275*  --   --  203*  --   BILITOT 0.5  --   --  1.2  --   PROT 5.3*  --   --  5.0*  --   ALBUMIN 1.2* 1.3* 1.3* 1.2* 1.1*   No results for input(s): LIPASE, AMYLASE in the last 168 hours. No results for input(s): AMMONIA in the last 168 hours. Coagulation Profile:  Recent Labs Lab 07/03/15 0859  INR 1.34   Cardiac Enzymes: No results for input(s): CKTOTAL, CKMB, CKMBINDEX, TROPONINI in the last 168 hours. BNP (last 3 results) No results for input(s): PROBNP in the last 8760 hours. HbA1C: No results for input(s): HGBA1C in the last 72 hours. CBG: No results for input(s): GLUCAP in the last 168 hours. Lipid Profile: No results for input(s): CHOL, HDL, LDLCALC, TRIG, CHOLHDL, LDLDIRECT in the last 72 hours. Thyroid Function Tests: No results for input(s): TSH, T4TOTAL, FREET4, T3FREE, THYROIDAB in the last 72 hours. Anemia Panel: No results for input(s): VITAMINB12, FOLATE, FERRITIN, TIBC, IRON, RETICCTPCT in the last 72 hours. Urine analysis:    Component Value Date/Time   COLORURINE BROWN* 07/07/2015 1908   APPEARANCEUR CLOUDY* 07/07/2015 1908   LABSPEC 1.014 07/07/2015 1908   LABSPEC 1.030 01/22/2015 1018   PHURINE 7.0 07/07/2015 1908   PHURINE 5.0 01/22/2015 1018   GLUCOSEU NEGATIVE 07/07/2015 1908   GLUCOSEU Negative 01/22/2015 1018   HGBUR LARGE* 07/07/2015 1908   HGBUR Large 01/22/2015 1018   BILIRUBINUR LARGE* 07/07/2015 1908   BILIRUBINUR Negative 01/22/2015 1018   KETONESUR 15* 07/07/2015 1908   KETONESUR Negative 01/22/2015 1018   PROTEINUR >300* 07/07/2015 1908   PROTEINUR 2000 01/22/2015 1018   UROBILINOGEN 0.2 01/22/2015 1018   UROBILINOGEN 0.2 01/31/2014 1050   NITRITE POSITIVE* 07/07/2015 1908   NITRITE Negative 01/22/2015 1018   LEUKOCYTESUR MODERATE* 07/07/2015 1908   LEUKOCYTESUR Color Interference due to blood in urine 01/22/2015 1018   Sepsis  Labs: @LABRCNTIP (procalcitonin:4,lacticidven:4)  ) Recent Results (from the past 240 hour(s))  Culture, blood (Routine X 2) w Reflex to ID Panel     Status: None (Preliminary result)   Collection Time:  07/07/15  6:15 PM  Result Value Ref Range Status   Specimen Description BLOOD RIGHT ANTECUBITAL  Final   Special Requests BOTTLES DRAWN AEROBIC ONLY 10CC  Final   Culture NO GROWTH < 24 HOURS  Final   Report Status PENDING  Incomplete  Culture, blood (Routine X 2) w Reflex to ID Panel     Status: None (Preliminary result)   Collection Time: 07/07/15  6:30 PM  Result Value Ref Range Status   Specimen Description BLOOD BLOOD RIGHT HAND  Final   Special Requests BOTTLES DRAWN AEROBIC AND ANAEROBIC 10CC   Final   Culture NO GROWTH < 24 HOURS  Final   Report Status PENDING  Incomplete      Radiology Studies: Dg Chest 2 View  07/07/2015  CLINICAL DATA:  Fever and leukocytosis EXAM: CHEST  2 VIEW COMPARISON:  07/06/2015 FINDINGS: Cardiac shadow is stable. Dialysis catheter is again seen. The inspiratory effort is improved with improvement of the basilar atelectasis. There remains soft tissue dense along the right chest wall consistent with the given clinical history. IMPRESSION: Slightly improved aeration with resolution of previously seen atelectasis Electronically Signed   By: Inez Catalina M.D.   On: 07/07/2015 19:05   Mr Brain Wo Contrast  07/06/2015  CLINICAL DATA:  Multiple myeloma undergoing chemotherapy. Severe nausea and vomiting. Posterior reversible encephalopathy. Follow-up. EXAM: MRI HEAD WITHOUT CONTRAST TECHNIQUE: Multiplanar, multiecho pulse sequences of the brain and surrounding structures were obtained without intravenous contrast. COMPARISON:  06/26/2015 FINDINGS: Diffusion imaging does not show any acute or subacute infarction. There is resolving cortical and subcortical edema of the brain bilaterally in a posterior distribution. No evidence of hemorrhagic transformation. No  evidence of hydrocephalus or extra-axial collection. Mucosal thickening and a small amount of fluid in the right maxillary sinus again noted. Multiple calvarial lesions unchanged since the previous study. IMPRESSION: Cortical and subcortical edema primarily affecting the posterior brain, improving when compared to the study of 06/26/2015, as expected with successful treatment/resolution of posterior reversible encephalopathy. No evidence of infarctions or hemorrhagic change. Multiple myeloma skull lesions as seen previously. Electronically Signed   By: Nelson Chimes M.D.   On: 07/06/2015 20:16     Scheduled Meds: . sodium chloride   Intravenous Once  . acyclovir  400 mg Oral QHS  . calcium acetate  1,334 mg Oral TID WC  . [START ON 07/13/2015] darbepoetin (ARANESP) injection - DIALYSIS  60 mcg Intravenous Q Mon-HD  . feeding supplement (NEPRO CARB STEADY)  237 mL Oral BID BM  . feeding supplement (PRO-STAT SUGAR FREE 64)  30 mL Oral BID  . heparin subcutaneous  5,000 Units Subcutaneous Q8H  . hydrALAZINE  25 mg Oral Q8H  . lenalidomide  10 mg Oral Q48H  . metoprolol tartrate  50 mg Oral BID  . mirtazapine  15 mg Oral QHS  . morphine  30 mg Oral Q12H  . multivitamin  1 tablet Oral QHS  . polyethylene glycol  17 g Oral Daily  . senna-docusate  2 tablet Oral BID   Continuous Infusions:    LOS: 15 days    Time spent: 60 min, including time spent speaking the Medical Oncology, Nephrology, Pharmacy, vascular surgery, radiation oncology, and family regarding plan of care.    Janece Canterbury, MD [facility] Pager [phone]  If 7PM-7AM, please contact night-coverage [email or web address] Password [facility]: Lambert Rhonda W 07/08/2015, 6:34 PM

## 2015-07-08 NOTE — Progress Notes (Signed)
OT Cancellation Note  Patient Details Name: Charles [name] MRN: [id number] DOB: 11-Apr-1975   Cancelled Treatment:    Reason Eval/Treat Not Completed: Patient at procedure or test/ unavailable - Will reattempt.   [place], OTR/L [phone]  Lucille Passy M 07/08/2015, 9:09 AM

## 2015-07-09 ENCOUNTER — Ambulatory Visit
Admit: 2015-07-09 | Discharge: 2015-07-09 | Disposition: A | Discharge status: Home / Self Care | Payer: Self-pay | Attending: Radiation Oncology | Admitting: Radiation Oncology

## 2015-07-09 ENCOUNTER — Ambulatory Visit: Payer: Self-pay

## 2015-07-09 DIAGNOSIS — N186 End stage renal disease: Secondary | ICD-10-CM

## 2015-07-09 DIAGNOSIS — Z992 Dependence on renal dialysis: Secondary | ICD-10-CM

## 2015-07-09 MED ORDER — HEPARIN SOD (PORK) LOCK FLUSH 100 UNIT/ML IV SOLN
250.0000 [IU] | Freq: Once | INTRAVENOUS | Status: DC | PRN
  Filled 2015-07-09: qty 3

## 2015-07-09 MED ORDER — SODIUM CHLORIDE 0.9 % IV SOLN: Freq: Once | INTRAVENOUS | Status: DC | PRN

## 2015-07-09 MED ORDER — EPINEPHRINE HCL 0.1 MG/ML IJ SOSY: 0.2500 mg | PREFILLED_SYRINGE | Freq: Once | INTRAMUSCULAR | Status: DC | PRN

## 2015-07-09 MED ORDER — ALTEPLASE 2 MG IJ SOLR: 2.0000 mg | Freq: Once | INTRAMUSCULAR | Status: DC | PRN

## 2015-07-09 MED ORDER — DIPHENHYDRAMINE HCL 50 MG/ML IJ SOLN: 25.0000 mg | Freq: Once | INTRAMUSCULAR | Status: DC | PRN

## 2015-07-09 MED ORDER — ALBUTEROL SULFATE (2.5 MG/3ML) 0.083% IN NEBU: 2.5000 mg | INHALATION_SOLUTION | Freq: Once | RESPIRATORY_TRACT | Status: DC | PRN

## 2015-07-09 MED ORDER — PROCHLORPERAZINE MALEATE 10 MG PO TABS
10.0000 mg | ORAL_TABLET | Freq: Once | ORAL | Status: AC
  Administered 2015-07-09: 10 mg via ORAL
  Filled 2015-07-09: qty 1

## 2015-07-09 MED ORDER — SODIUM CHLORIDE 0.9 % IV SOLN
Freq: Once | INTRAVENOUS | Status: AC
  Administered 2015-07-09: 09:00:00 via INTRAVENOUS

## 2015-07-09 MED ORDER — SODIUM CHLORIDE 0.9% FLUSH: 3.0000 mL | INTRAVENOUS | Status: DC | PRN

## 2015-07-09 MED ORDER — DIPHENHYDRAMINE HCL 50 MG/ML IJ SOLN: 50.0000 mg | Freq: Once | INTRAMUSCULAR | Status: DC | PRN

## 2015-07-09 MED ORDER — METHYLPREDNISOLONE SODIUM SUCC 125 MG IJ SOLR: 125.0000 mg | Freq: Once | INTRAMUSCULAR | Status: DC | PRN

## 2015-07-09 MED ORDER — EPINEPHRINE HCL 1 MG/ML IJ SOLN
0.5000 mg | Freq: Once | INTRAMUSCULAR | Status: DC | PRN
  Filled 2015-07-09: qty 1

## 2015-07-09 MED ORDER — SODIUM CHLORIDE 0.9 % IV SOLN
Freq: Once | INTRAVENOUS | Status: AC
  Administered 2015-07-09: 12:00:00 via INTRAVENOUS

## 2015-07-09 MED ORDER — DEXAMETHASONE SODIUM PHOSPHATE 100 MG/10ML IJ SOLN
20.0000 mg | Freq: Once | INTRAMUSCULAR | Status: AC
  Administered 2015-07-09: 20 mg via INTRAVENOUS
  Filled 2015-07-09: qty 2

## 2015-07-09 MED ORDER — HEPARIN SOD (PORK) LOCK FLUSH 100 UNIT/ML IV SOLN
500.0000 [IU] | Freq: Once | INTRAVENOUS | Status: DC | PRN
  Filled 2015-07-09: qty 5

## 2015-07-09 MED ORDER — FAMOTIDINE IN NACL 20-0.9 MG/50ML-% IV SOLN
20.0000 mg | Freq: Once | INTRAVENOUS | Status: DC | PRN
  Filled 2015-07-09: qty 50

## 2015-07-09 MED ORDER — DEXTROSE 5 % IV SOLN
27.0000 mg/m2 | Freq: Once | INTRAVENOUS | Status: AC
  Administered 2015-07-09: 46 mg via INTRAVENOUS
  Filled 2015-07-09: qty 23

## 2015-07-09 MED ORDER — SODIUM CHLORIDE 0.9% FLUSH: 10.0000 mL | INTRAVENOUS | Status: DC | PRN

## 2015-07-09 MED ORDER — EPINEPHRINE HCL 0.1 MG/ML IJ SOSY
0.2500 mg | PREFILLED_SYRINGE | Freq: Once | INTRAMUSCULAR | Status: DC | PRN
Start: 2015-07-09 — End: 2015-07-10

## 2015-07-09 NOTE — Progress Notes (Signed)
[facility] Progress Note  Assessment/Plan: 1 Renal failure - w known myeloma and progressive renal failure last 5 mos. 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 New TDC and sp HD x 4 Started HD 07/03/15- plan HD Friday and maybe again Sat. Access placement scheduled for 6/12. 2 Volume - gradually lowering volume - net UF 2.4 6/7 - continue to titrate down; if Na still low Friday, ^ goal  3 Mult myeloma - received carfilzomib 2x on 5/31and 6/1, now back on maint lenalidomide 4 PRES syndrome - resolved 5 HTN urgency - resolved, on po hydral/ MTP,BP 110- 120s 6 MBD- Phos 8.4 PTH 74 Corr Ca 9.8  No VDRA for now.  7 Anemia - of ESRD and Myeloma, Hb low 7.1, Oncology ok with giving ESA.  Aranesp 60 given 6/4 tsat 43% 6/3- Plan transfusion 2 units PRBC with HD on Friday 8. Severe protein malnutrition: Albumin 1.1 - renal diet-add prostat and renal vit 9. Fever - tmax 100 BC no growth pending; mild leukocytosis; urine culture pending suspect MM 10. Disp - planning on starting Tuesday June 13 at [facility] at [place] to sign paper and 7 am to start HD - pt and wife have been informed of the change  Myriam Jacobson, PA-C [facility] [phone] 07/09/2015,8:57 AM  LOS: 16 days   Subjective:   No c/o ate breakfast, not taking supplements  Objective Filed Vitals:   07/09/15 0050 07/09/15 0110 07/09/15 0430 07/09/15 0609  BP: 114/70 108/61 124/64 118/66  Pulse: 72 68 64 69  Temp: 98.3 F (36.8 C) 98.4 F (36.9 C) 98.4 F (36.9 C) 98.2 F (36.8 C)  TempSrc: Oral Oral Oral Oral  Resp: 18 18 16 16   Height:      Weight:      SpO2: 99% 98% 99% 94%   Physical Exam General: ill appearing Heart: RRR Lungs: no rales Abdomen: soft NT Extremities: no  edema Dialysis Access: right IJ  Dialysis Orders: - new start wil be TTS AF 7am - first appt 6/3 at 6 am   Additional Objective Labs: Basic Metabolic Panel:  Recent Labs Lab 07/05/15 2355 07/06/15 1345  07/07/15 1815 07/08/15 0716  NA 134* 133* 133* 133*  K 4.8 4.5 4.5 4.2  CL 97* 96* 95* 94*  CO2 24 23 28 25   GLUCOSE 157* 135* 97 83  BUN 73* 89* 63* 75*  CREATININE 6.08* 6.87* 5.60* 6.51*  CALCIUM 7.7* 7.4* 8.3* 8.4*  PHOS 8.4* 9.6*  --  8.4*   Liver Function Tests:  Recent Labs Lab 07/05/15 0434  07/06/15 1345 07/07/15 1815 07/08/15 0716  AST 16  --   --  16  --   ALT 17  --   --  14*  --   ALKPHOS 275*  --   --  203*  --   BILITOT 0.5  --   --  1.2  --   PROT 5.3*  --   --  5.0*  --   ALBUMIN 1.2*  < > 1.3* 1.2* 1.1*  < > = values in this interval not displayed. No results for input(s): LIPASE, AMYLASE in the last 168 hours. CBC:  Recent Labs Lab 07/03/15 0859 07/04/15 1319 07/05/15 0434 07/06/15 1345 07/07/15 1815 07/08/15 0716  WBC 15.0* 6.8 8.7 14.2* 11.5* 11.7*  NEUTROABS 12.4*  --   --   --  7.9*  --   HGB 7.7* 7.3* 7.3* 7.7* 7.5* 7.1*  HCT 24.3* 23.4* 23.7* 24.7* 24.2* 23.5*  MCV 95.3 94.4 95.2 94.6 98.4 96.3  PLT 432* 339 335 416* 354 359   Blood Culture    Component Value Date/Time   SDES BLOOD BLOOD RIGHT HAND 07/07/2015 1830   SPECREQUEST BOTTLES DRAWN AEROBIC AND ANAEROBIC 10CC  07/07/2015 1830   CULT NO GROWTH < 24 HOURS 07/07/2015 1830   REPTSTATUS PENDING 07/07/2015 1830     Studies/Results: Dg Chest 2 View  07/07/2015  CLINICAL DATA:  Fever and leukocytosis EXAM: CHEST  2 VIEW COMPARISON:  07/06/2015 FINDINGS: Cardiac shadow is stable. Dialysis catheter is again seen. The inspiratory effort is improved with improvement of the basilar atelectasis. There remains soft tissue dense along the right chest wall consistent with the given clinical history. IMPRESSION: Slightly improved aeration with resolution of previously seen atelectasis Electronically Signed   By: Inez Catalina M.D.   On: 07/07/2015 19:05   Mr Cervical Spine Wo Contrast  07/09/2015  CLINICAL DATA:  Initial evaluation for right arm weakness. EXAM: MRI CERVICAL SPINE WITHOUT  CONTRAST TECHNIQUE: Multiplanar, multisequence MR imaging of the cervical spine was performed. No intravenous contrast was administered. COMPARISON:  None. FINDINGS: Alignment: Ir to row bodies normally aligned with preservation of the normal cervical lordosis. No listhesis or malalignment. Vertebrae: Diffusely abnormal bone marrow with multiple osseous lesions present, consistent with known multiple myeloma. No extra osseous extension of tumor. No epidural tumor. No pathologic fracture. Cord: Signal intensity within the cervical spinal cord is normal. Posterior Fossa, vertebral arteries, paraspinal tissues: Visualized posterior fossa a grossly unremarkable. Changes related to known pressed are not well appreciated on this exam. Craniocervical junction normal. Paraspinous soft tissues within normal limits. No prevertebral or paravertebral edema. Normal intravascular flow voids present within the vertebral arteries bilaterally. Disc levels: C2-C3: Negative. C3-C4: Mild bilateral uncovertebral hypertrophy, left slightly worse than right. Mild left foraminal stenosis. No canal or right foraminal narrowing. C4-C5: Mild degenerative disc bulge with bilateral uncovertebral hypertrophy. No significant canal stenosis. Minimal bilateral foraminal narrowing. C5-C6:  Minimal uncovertebral hypertrophy.  No stenosis. C6-C7:  Negative. C7-T1:  Negative. IMPRESSION: 1. Diffusely abnormal bone marrow, consistent with known history of multiple myeloma. No extraosseous or epidural tumor. No pathologic fracture or other complication. 2. Relatively mild multilevel degenerative spondylolysis as above. No significant canal or foraminal stenosis. No findings to explain right upper extremity symptoms. Electronically Signed   By: Jeannine Boga M.D.   On: 07/09/2015 01:48   Medications:   . sodium chloride   Intravenous Once  . sodium chloride   Intravenous Once  . acyclovir  400 mg Oral QHS  . calcium acetate  1,334 mg Oral  TID WC  . carfilzomib (KYPROLIS) CHEMO IV infusion  27 mg/m2 (Treatment Plan Actual) Intravenous Once  . [START ON 07/13/2015] darbepoetin (ARANESP) injection - DIALYSIS  60 mcg Intravenous Q Mon-HD  . dexamethasone (DECADRON) IVPB [facility]  20 mg Intravenous Once  . feeding supplement (NEPRO CARB STEADY)  237 mL Oral BID BM  . feeding supplement (PRO-STAT SUGAR FREE 64)  30 mL Oral BID  . heparin subcutaneous  5,000 Units Subcutaneous Q8H  . hydrALAZINE  25 mg Oral Q8H  . lenalidomide  10 mg Oral Q48H  . metoprolol tartrate  50 mg Oral BID  . mirtazapine  15 mg Oral QHS  . morphine  30 mg Oral Q12H  . multivitamin  1 tablet Oral QHS  . polyethylene glycol  17 g Oral Daily  . prochlorperazine  10 mg Oral Once  . senna-docusate  2  tablet Oral BID

## 2015-07-09 NOTE — Progress Notes (Signed)
IV site patency verified before, during, and after infusion with good blood return noted. Family at the bedside. Patient has no complaints at this time. Chemo is completed at 1318.

## 2015-07-09 NOTE — Progress Notes (Signed)
PROGRESS NOTE  Charles [name]  ZWC:[id number] DOB: Jan 03, 1976 DOA: 06/23/2015 PCP: [name], MD  Brief Narrative:   Charles [name] is a 40 y.o. male with medical history significant of Multiple Myeloma, CKD 3, back pain, currently undergoing chemotherapy, reports he was started on a new chemotherapeutic agent recently. Patient admitted with Nausea, vomiting, diarrhea. Thought to be to chemo related. Subsequently he developed PRES syndrome, Malignant HTN, visual disturbance and possible PNA. Neurology consulted. He was started on Vancomycin and cefepime.  Due to ongoing fevers, ID consulted. Fever was deemed to be tumor fever, and antibiotics were stopped.  He developed myeloma kidney with ARF and he started hemodialysis.  He was transferred to [facility] from [place] for this reason.  He has completed several treatments of HD and CLIP process has been completed.  He will start hemodialysis on Tuesday next week as an outpatient.  Also, Mri spine showed MM progression and cord involvement and he will undergo palliative XRT as an outpatient.  Plan is to give chemotherapy on 6/7 and 6/8 at [name].  Infusion RN from [facility] comes to [facility] to administer the dose.  He will need HD either Friday or Saturday and barring any complications can be discharged over the weekend.    Assessment & Plan:   Principal Problem:   AKI (acute kidney injury) ([place]) Active Problems:   Multiple myeloma (HCC)   CKD (chronic kidney disease) stage 3, GFR 30-59 ml/min   Nausea with vomiting   Blurry vision, bilateral   HTN (hypertension), malignant   PRES (posterior reversible encephalopathy syndrome)   FUO (fever of unknown origin)   Back pain   Multiple myeloma not having achieved remission (HCC)   Multiple myeloma in relapse ([place])   Symptomatic anemia   Multiple myeloma ([place]) in relapse, started carfilzomib, cyclophosphamide, dexamethasone, cycle 1 started on 4/27, however, he did not complete a  full cycle because he was intolerant of cyclophosphamide.  Oncologist feels that he may have a response to this chemotherapy, but given his degree of illness with ongoing fevers, need for dialysis, diminished appetite, his prognosis is guarded.   -  Cycle 2 started on 5/31, and lenalidomide was added.  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    ESRD, secondary to Myeloma Kidney -  Started HD on 6/2 -  HD center arranged, patient to arrive on Monday to complete paperwork -  Plan for HD on Saturday, then discharge -  He will need to go to HD   HTN Emergency, PRES syndrome, possibly due to side effect from protesome inhibitor, resolved Patient report vision changes the morning 5-26, MRI consistent with PRES MRI brain demonstrates improvement in edema without hemorrhage or infarct off nicardipinegtt on 5/31 continue hydralazine 11m po tid, and metoprolol 521mpo bid SBP goal 120--140 Per neurology  Fevers likelysecondary to multiple myeloma, no clear pneumonia or UTI.  Pan CT unrevealing, MRI spine no infection  Spike fever again on 6/6:  CXR unremarkable and UA with  leukocytes and nitrites but difficult to interpret in setting of ESRD - f/u urine culture and hold on abx for now ID signed off   Nausea with vomiting on admission, possibly due to chemotherapy -  Ct ab no obstruction. -  resolved   Right arm weakness, may be secondary to recent PRES, but has spinal cord involvement in other areas.   -  MRI cervical spine: Diffusely abnormal  bone marrow, consistent with multiple myeloma. No extra osseus or epidural tumor. No pathological fracture or other complication. No findings to explain RUE symptoms.  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 Hemoglobin 7.1. Consider blood transfusion across dialysis tomorrow if hemoglobin 7 or less.  Thyroid nodules: can follow up as oupatient  Back pain: stable, improved  Leukocytosis, likely related to dexamethasone, but concerning for infection also.  See above  DVT prophylaxis:  heparin Code Status:  Full code Family Communication:  Patient and spouse at bedside.  Patient has a wife and 4 or 5 children.  He has been out of work for the last year and half after starting chemotherapy and his wife cares for him.  Their two oldest children are in their early 92s.  They work and support the family.  They have several young children, youngest is 17-years old.   Disposition Plan:  COMPLICATION:  He needs his outpatient HD sessions to be FIRST SHIFT because he will need to go AFTER hemodialysis to get XRT and chemotherapy at the [facility] at least one day a week.  He was appropriately scheduled for 7am, but a note this afternoon states they changed his time to 11AM.  This will need to be addressed before he can be discharged-this was discussed with nephrology service on 6/8 and they will appropriately address.    Consultants:  Nephrology, oncology, neurology, Infectious disease, Vascular surgery, Interventional radiology  Procedures:  HD tunneled catheter placement on 6/2  Antimicrobials:   5 day course of vancomycin and cefepime   Subjective: Patient's spouse at bedside interpreted history. She states that patient does not like to get out of bed and ambulate although she has asked him to. No other complaints reported.  Objective: Filed Vitals:   07/09/15 0110 07/09/15 0430 07/09/15 0609 07/09/15 1000  BP: 108/61 124/64 118/66 117/64  Pulse: 68 64  69 68  Temp: 98.4 F (36.9 C) 98.4 F (36.9 C) 98.2 F (36.8 C) 98.4 F (36.9 C)  TempSrc: Oral Oral Oral Oral  Resp: _0 Height:      Weight:      SpO2: 98% 99% 94% 96%    Intake/Output Summary (Last 24 hours) at 07/09/15 1312 Last data filed at 07/09/15 1045  Gross per 24 hour  Intake   1245 ml  Output      0 ml  Net   1245 ml   Filed Weights   07/08/15 0640 07/08/15 1040 07/08/15 2146  Weight: 66.1 kg (145 lb 11.6 oz) 64 kg (141 lb 1.5 oz) 64.5 kg (142 lb 3.2 oz)    Examination:  General exam:  Pleasant young male lying comfortably supine in bed. HEENT:  NCAT, MMM Respiratory system: Clear to auscultation bilaterally. No increased work of breathing. Cardiovascular system: Regular rate and rhythm, normal S1/S2. No murmurs, rubs, gallops or clicks.  Warm extremities.  Right chest wall HD catheter with clean dressing, no surrounding erythema or induration.  Telemetry: Sinus rhythm. Gastrointestinal system: Normal active bowel sounds, soft, nondistended, nontender. MSK:  Normal tone and bulk, no lower extremity edema. Neuro:  Tremor, dysmetria of the right hand, strength 4/5 right hand.  Left hand 5/5 and bilateral legs 5/5.  Alert and oriented. No focal neurological deficits.    Data Reviewed: I have personally reviewed following labs and imaging studies  CBC:  Recent Labs Lab 07/03/15 0859 07/04/15 1319 07/05/15 0434 07/06/15 1345 07/07/15 1815 07/08/15 0716  WBC 15.0* 6.8 8.7 14.2* 11.5* 11.7*  NEUTROABS 12.4*  --   --   --  7.9*  --   HGB 7.7* 7.3* 7.3* 7.7* 7.5* 7.1*  HCT 24.3* 23.4* 23.7* 24.7* 24.2* 23.5*  MCV 95.3 94.4 95.2 94.6 98.4 96.3  PLT 432* 339 335 416* 354 248   Basic Metabolic Panel:  Recent Labs Lab 07/04/15 1321 07/05/15 0434 07/05/15 2355 07/06/15 1345 07/07/15 1815 07/08/15 0716  NA 134* 137 134* 133* 133* 133*  K 4.3 4.7 4.8 4.5 4.5 4.2  CL 101 99* 97* 96* 95* 94*  CO2 _0 GLUCOSE 93 103* 157* 135* 97  83  BUN 85* 52* 73* 89* 63* 75*  CREATININE 5.81* 4.66* 6.08* 6.87* 5.60* 6.51*  CALCIUM 7.6* 7.7* 7.7* 7.4* 8.3* 8.4*  PHOS 8.9*  --  8.4* 9.6*  --  8.4*   GFR: Estimated Creatinine Clearance: 12.2 mL/min (by C-G formula based on Cr of 6.51). Liver Function Tests:  Recent Labs Lab 07/05/15 0434 07/05/15 2355 07/06/15 1345 07/07/15 1815 07/08/15 0716  AST 16  --   --  16  --   ALT 17  --   --  14*  --   ALKPHOS 275*  --   --  203*  --   BILITOT 0.5  --   --  1.2  --   PROT 5.3*  --   --  5.0*  --   ALBUMIN 1.2* 1.3* 1.3* 1.2* 1.1*   No results for input(s): LIPASE, AMYLASE in the last 168 hours. No results for input(s): AMMONIA in the last 168 hours. Coagulation Profile:  Recent Labs Lab 07/03/15 0859  INR 1.34   Cardiac Enzymes: No results for input(s): CKTOTAL, CKMB, CKMBINDEX, TROPONINI in the last 168 hours. BNP (last 3 results) No results for input(s): PROBNP in the last 8760 hours. HbA1C: No results for input(s): HGBA1C in the last 72 hours. CBG: No results for input(s): GLUCAP in the last 168 hours. Lipid Profile: No results for input(s): CHOL, HDL, LDLCALC, TRIG, CHOLHDL, LDLDIRECT in the last 72 hours. Thyroid Function Tests: No results for input(s): TSH, T4TOTAL, FREET4, T3FREE, THYROIDAB in the last 72 hours. Anemia Panel: No results for input(s): VITAMINB12, FOLATE, FERRITIN, TIBC, IRON, RETICCTPCT in the last 72 hours. Urine analysis:    Component Value Date/Time   COLORURINE BROWN* 07/07/2015 1908   APPEARANCEUR CLOUDY* 07/07/2015 1908   LABSPEC 1.014 07/07/2015 1908   LABSPEC 1.030 01/22/2015 1018   PHURINE 7.0 07/07/2015 1908   PHURINE 5.0 01/22/2015 1018   GLUCOSEU NEGATIVE 07/07/2015 1908   GLUCOSEU Negative 01/22/2015 1018   HGBUR LARGE* 07/07/2015 1908   HGBUR Large 01/22/2015 1018   BILIRUBINUR LARGE* 07/07/2015 1908   BILIRUBINUR Negative 01/22/2015 1018   KETONESUR 15* 07/07/2015 1908   KETONESUR Negative 01/22/2015 1018    PROTEINUR >300* 07/07/2015 1908   PROTEINUR 2000 01/22/2015 1018   UROBILINOGEN 0.2 01/22/2015 1018   UROBILINOGEN 0.2 01/31/2014 1050   NITRITE POSITIVE* 07/07/2015 1908   NITRITE Negative 01/22/2015 1018   LEUKOCYTESUR MODERATE* 07/07/2015 1908  LEUKOCYTESUR Color Interference due to blood in urine 01/22/2015 1018   Sepsis Labs: _0 (procalcitonin:4,lacticidven:4)  ) Recent Results (from the past 240 hour(s))  Culture, blood (Routine X 2) w Reflex to ID Panel     Status: None (Preliminary result)   Collection Time: 07/07/15  6:15 PM  Result Value Ref Range Status   Specimen Description BLOOD RIGHT ANTECUBITAL  Final   Special Requests BOTTLES DRAWN AEROBIC ONLY 10CC  Final   Culture NO GROWTH < 24 HOURS  Final   Report Status PENDING  Incomplete  Culture, blood (Routine X 2) w Reflex to ID Panel     Status: None (Preliminary result)   Collection Time: 07/07/15  6:30 PM  Result Value Ref Range Status   Specimen Description BLOOD BLOOD RIGHT HAND  Final   Special Requests BOTTLES DRAWN AEROBIC AND ANAEROBIC 10CC   Final   Culture NO GROWTH < 24 HOURS  Final   Report Status PENDING  Incomplete      Radiology Studies: Dg Chest 2 View  07/07/2015  CLINICAL DATA:  Fever and leukocytosis EXAM: CHEST  2 VIEW COMPARISON:  07/06/2015 FINDINGS: Cardiac shadow is stable. Dialysis catheter is again seen. The inspiratory effort is improved with improvement of the basilar atelectasis. There remains soft tissue dense along the right chest wall consistent with the given clinical history. IMPRESSION: Slightly improved aeration with resolution of previously seen atelectasis Electronically Signed   By: Inez Catalina M.D.   On: 07/07/2015 19:05   Mr Cervical Spine Wo Contrast  07/09/2015  CLINICAL DATA:  Initial evaluation for right arm weakness. EXAM: MRI CERVICAL SPINE WITHOUT CONTRAST TECHNIQUE: Multiplanar, multisequence MR imaging of the cervical spine was performed. No intravenous contrast  was administered. COMPARISON:  None. FINDINGS: Alignment: Ir to row bodies normally aligned with preservation of the normal cervical lordosis. No listhesis or malalignment. Vertebrae: Diffusely abnormal bone marrow with multiple osseous lesions present, consistent with known multiple myeloma. No extra osseous extension of tumor. No epidural tumor. No pathologic fracture. Cord: Signal intensity within the cervical spinal cord is normal. Posterior Fossa, vertebral arteries, paraspinal tissues: Visualized posterior fossa a grossly unremarkable. Changes related to known pressed are not well appreciated on this exam. Craniocervical junction normal. Paraspinous soft tissues within normal limits. No prevertebral or paravertebral edema. Normal intravascular flow voids present within the vertebral arteries bilaterally. Disc levels: C2-C3: Negative. C3-C4: Mild bilateral uncovertebral hypertrophy, left slightly worse than right. Mild left foraminal stenosis. No canal or right foraminal narrowing. C4-C5: Mild degenerative disc bulge with bilateral uncovertebral hypertrophy. No significant canal stenosis. Minimal bilateral foraminal narrowing. C5-C6:  Minimal uncovertebral hypertrophy.  No stenosis. C6-C7:  Negative. C7-T1:  Negative. IMPRESSION: 1. Diffusely abnormal bone marrow, consistent with known history of multiple myeloma. No extraosseous or epidural tumor. No pathologic fracture or other complication. 2. Relatively mild multilevel degenerative spondylolysis as above. No significant canal or foraminal stenosis. No findings to explain right upper extremity symptoms. Electronically Signed   By: Jeannine Boga M.D.   On: 07/09/2015 01:48     Scheduled Meds: . sodium chloride   Intravenous Once  . acyclovir  400 mg Oral QHS  . calcium acetate  1,334 mg Oral TID WC  . [START ON 07/13/2015] darbepoetin (ARANESP) injection - DIALYSIS  60 mcg Intravenous Q Mon-HD  . feeding supplement (NEPRO CARB STEADY)  237 mL  Oral BID BM  . feeding supplement (PRO-STAT SUGAR FREE 64)  30 mL Oral BID  . heparin subcutaneous  5,000  Units Subcutaneous Q8H  . hydrALAZINE  25 mg Oral Q8H  . lenalidomide  10 mg Oral Q48H  . metoprolol tartrate  50 mg Oral BID  . mirtazapine  15 mg Oral QHS  . morphine  30 mg Oral Q12H  . multivitamin  1 tablet Oral QHS  . polyethylene glycol  17 g Oral Daily  . senna-docusate  2 tablet Oral BID   Continuous Infusions:    LOS: 16 days    Time spent: 20 minutes.    [facility], MD [facility] Pager [phone]  If 7PM-7AM, please contact night-coverage [email or web address] Password [email or web address] 07/09/2015, 1:12 PM

## 2015-07-09 NOTE — Progress Notes (Signed)
The patient's dialysis scheduled has been adjusted.  He will need to be at [facility] dialysis center Tuesday June 13th at 6 am to sign papers and he will start dialysis at 7 am.  He will need to have a family or friend with him to help translate.  Amalia Hailey, PA-C

## 2015-07-09 NOTE — Progress Notes (Signed)
Calculations for carfilzomib dosage has been completed by Audley Hose RN and Dorris Carnes RN.

## 2015-07-09 NOTE — Evaluation (Signed)
Occupational Therapy Evaluation Patient Details Name: Khylon Davies MRN: [id number] DOB: 1975-12-16 Today's Date: 07/09/2015    History of Present Illness Pt with reports of NauNausea, Vomiting, diarrhea, fever and chills, feeling feverish. Pt currently on chemotherapy   Clinical Impression   Pt with decline in in B hand/UE function. Pt unable to use FM grip/grasp and demos mild - moderate tremor - like "jerking" and significant weakness in B hands. Pt unable to hold utensils for self feeding, difficultly with grooming/hygiene tasks and using GM grasp to compensate to pick up small items. Pt able to get OOB and walk to bathroom, bathe and dress self with sup due to low endurance/fatigue. Pt provided with exercises for B hand strength and coordination with pt's wife being educated as well. OT will continue to follow    Follow Up Recommendations       Equipment Recommendations  None recommended by OT    Recommendations for Other Services       Precautions / Restrictions Precautions Precaution Comments: chemotherapy, SFK:CLEXNT glove with gown Restrictions Weight Bearing Restrictions: No      Mobility Bed Mobility Overal bed mobility: Independent                Transfers Overall transfer level: Needs assistance Equipment used: None Transfers: Sit to/from Stand Sit to Stand: Supervision         General transfer comment: sup due to decreased endurance    Balance Overall balance assessment: No apparent balance deficits (not formally assessed)                                          ADL Overall ADL's : Needs assistance/impaired Eating/Feeding: Sitting;Minimal assistance Eating/Feeding Details (indicate cue type and reason): dropping utensils Grooming: Minimal assistance;Standing Grooming Details (indicate cue type and reason): unable to open toothpaste Upper Body Bathing: Supervision/ safety   Lower Body Bathing: Supervison/ safety    Upper Body Dressing : Supervision/safety   Lower Body Dressing: Supervision/safety   Toilet Transfer: Supervision/safety   Toileting- Clothing Manipulation and Hygiene: Supervision/safety   Tub/ Shower Transfer: Supervision/safety   Functional mobility during ADLs: Supervision/safety       Vision  no change in baseline per pt              Pertinent Vitals/Pain Pain Assessment: No/denies pain     Hand Dominance Right   Extremity/Trunk Assessment Upper Extremity Assessment Upper Extremity Assessment: Generalized weakness;RUE deficits/detail;LUE deficits/detail RUE Deficits / Details: observed  tremor -  like "jerking" in B hands at rest in during activity. Very weak grip strength, 3-/5 grossly. FMC/grasp impaired. Pt unable to pince items using fingers/FM grip and required picking up small objects using gross motor grasp to compensate RUE Sensation: decreased light touch RUE Coordination: decreased fine motor           Communication Communication Communication: Other (comment) (Spanish is main language, speaks some [place])   Cognition Arousal/Alertness: Awake/alert Behavior During Therapy: WFL for tasks assessed/performed Overall Cognitive Status: Within Functional Limits for tasks assessed                     General Comments   pt pleasant and cooperative, fatigues easily. Wife very supportive    Exercises   Other Exercises Other Exercises: pt provided with yellow theraputty and blue squeeze ball for B hand strengthening exercises. Pt's wife present and educated  on execises as well         Home Living Family/patient expects to be discharged to:: Private residence Living Arrangements: Spouse/significant other Available Help at Discharge: Family Type of Home: House Home Access: Stairs to enter Technical brewer of Steps: 2 Entrance Stairs-Rails: None Home Layout: One level     Bathroom Shower/Tub: Tub/shower unit;Walk-in shower    Bathroom Toilet: Standard     Home Equipment: None          Prior Functioning/Environment Level of Independence: Independent             OT Diagnosis: Generalized weakness   OT Problem List: Impaired UE functional use;Decreased coordination;Decreased activity tolerance;Decreased strength   OT Treatment/Interventions: Self-care/ADL training;Therapeutic exercise;Patient/family education;Therapeutic activities;DME and/or AE instruction    OT Goals(Current goals can be found in the care plan section) Acute Rehab OT Goals Patient Stated Goal: feel better and go home OT Goal Formulation: With patient/family Time For Goal Achievement: 07/16/15 Potential to Achieve Goals: Good ADL Goals Pt Will Perform Eating: with supervision;with set-up;with adaptive utensils;sitting Pt Will Perform Grooming: sitting;with adaptive equipment;with supervision;with set-up;standing Additional ADL Goal #1: Pt will participate in B hand strengtheneing/FMC exercises to increase function and decrease difficulty for grooming  tasks Additional ADL Goal #2: Pt will be able to pick up 3/5 small items using pincer grip  OT Frequency: Min 2X/week   Barriers to D/C:    none                     End of Session    Activity Tolerance: Patient limited by fatigue Patient left: in bed;with call bell/phone within reach;with family/visitor present   Time: 0922-0959 OT Time Calculation (min): 37 min Charges:  OT Evaluation $OT Eval Moderate Complexity: 1 Procedure OT Treatments $Therapeutic Activity: 8-22 mins $Therapeutic Exercise: 8-22 mins G-Codes:    Britt Bottom 07/09/2015, 1:22 PM

## 2015-07-10 ENCOUNTER — Ambulatory Visit: Payer: Self-pay

## 2015-07-10 ENCOUNTER — Ambulatory Visit
Admit: 2015-07-10 | Discharge: 2015-07-10 | Disposition: A | Discharge status: Home / Self Care | Payer: Self-pay | Attending: Radiation Oncology | Admitting: Radiation Oncology

## 2015-07-10 ENCOUNTER — Other Ambulatory Visit: Payer: Self-pay | Admitting: Oncology

## 2015-07-10 ENCOUNTER — Ambulatory Visit: Payer: MEDICAID | Admitting: Radiation Oncology

## 2015-07-10 ENCOUNTER — Other Ambulatory Visit: Payer: Self-pay

## 2015-07-10 ENCOUNTER — Telehealth: Payer: Self-pay | Admitting: Oncology

## 2015-07-10 ENCOUNTER — Telehealth: Payer: Self-pay | Admitting: *Deleted

## 2015-07-10 LAB — CBC
HCT: 27.1 % — ABNORMAL LOW (ref 39.0–52.0)
Hemoglobin: 8.5 g/dL — ABNORMAL LOW (ref 13.0–17.0)
MCH: 29.2 pg (ref 26.0–34.0)
MCHC: 31.4 g/dL (ref 30.0–36.0)
MCV: 93.1 fL (ref 78.0–100.0)
Platelets: 218 10*3/uL (ref 150–400)
RBC: 2.91 MIL/uL — ABNORMAL LOW (ref 4.22–5.81)
RDW: 16.4 % — ABNORMAL HIGH (ref 11.5–15.5)
WBC: 8.5 10*3/uL (ref 4.0–10.5)

## 2015-07-10 LAB — RENAL FUNCTION PANEL
Albumin: 1.2 g/dL — ABNORMAL LOW (ref 3.5–5.0)
Anion gap: 14 (ref 5–15)
BUN: 84 mg/dL — ABNORMAL HIGH (ref 6–20)
CO2: 25 mmol/L (ref 22–32)
Calcium: 7.1 mg/dL — ABNORMAL LOW (ref 8.9–10.3)
Chloride: 98 mmol/L — ABNORMAL LOW (ref 101–111)
Creatinine, Ser: 7.26 mg/dL — ABNORMAL HIGH (ref 0.61–1.24)
GFR calc Af Amer: 10 mL/min — ABNORMAL LOW (ref >60)
GFR calc non Af Amer: 8 mL/min — ABNORMAL LOW (ref >60)
Glucose, Bld: 117 mg/dL — ABNORMAL HIGH (ref 65–99)
Phosphorus: 7.2 mg/dL — ABNORMAL HIGH (ref 2.5–4.6)
Potassium: 4.1 mmol/L (ref 3.5–5.1)
Sodium: 137 mmol/L (ref 135–145)

## 2015-07-10 LAB — TYPE AND SCREEN
ABO/RH(D): O POS
Antibody Screen: NEGATIVE
UNIT DIVISION: 0
Unit division: 0

## 2015-07-10 MED ORDER — CALCIUM ACETATE (PHOS BINDER) 667 MG PO CAPS: 1334.0000 mg | ORAL_CAPSULE | Freq: Three times a day (TID) | ORAL | Status: DC

## 2015-07-10 MED ORDER — METOPROLOL TARTRATE 50 MG PO TABS: 50.0000 mg | ORAL_TABLET | Freq: Two times a day (BID) | ORAL | Status: DC

## 2015-07-10 MED ORDER — DEXAMETHASONE 4 MG PO TABS: ORAL_TABLET | ORAL | Status: DC

## 2015-07-10 MED ORDER — PRO-STAT SUGAR FREE PO LIQD
30.0000 mL | Freq: Two times a day (BID) | ORAL | Status: DC
Start: 2015-07-10 — End: 2015-09-23

## 2015-07-10 MED ORDER — CEFUROXIME SODIUM 1.5 G IJ SOLR: 1.5000 g | INTRAMUSCULAR | Status: DC

## 2015-07-10 MED ORDER — MORPHINE SULFATE ER 30 MG PO TBCR: 30.0000 mg | EXTENDED_RELEASE_TABLET | Freq: Two times a day (BID) | ORAL | Status: DC

## 2015-07-10 MED ORDER — NEPRO/CARBSTEADY PO LIQD: 237.0000 mL | Freq: Two times a day (BID) | ORAL | Status: DC

## 2015-07-10 MED ORDER — MIRTAZAPINE 15 MG PO TABS: 15.0000 mg | ORAL_TABLET | Freq: Every day | ORAL | Status: DC

## 2015-07-10 MED ORDER — HYDRALAZINE HCL 25 MG PO TABS: 25.0000 mg | ORAL_TABLET | Freq: Three times a day (TID) | ORAL | Status: DC

## 2015-07-10 MED ORDER — SENNOSIDES-DOCUSATE SODIUM 8.6-50 MG PO TABS: 2.0000 | ORAL_TABLET | Freq: Two times a day (BID) | ORAL | Status: DC

## 2015-07-10 MED ORDER — LENALIDOMIDE 10 MG PO CAPS: 10.0000 mg | ORAL_CAPSULE | ORAL | Status: DC

## 2015-07-10 MED ORDER — DEXTROSE 5 % IV SOLN: 1.5000 g | INTRAVENOUS | Status: DC

## 2015-07-10 MED ORDER — POLYETHYLENE GLYCOL 3350 17 G PO PACK: 17.0000 g | PACK | Freq: Every day | ORAL | Status: DC

## 2015-07-10 MED ORDER — RENA-VITE PO TABS: 1.0000 | ORAL_TABLET | Freq: Every day | ORAL | Status: DC

## 2015-07-10 MED FILL — POLYETHYLENE GLYCOL 3350: 15 days supply | Qty: 255 | Fill #0

## 2015-07-10 MED FILL — MORPHINE SULF 30 MG TAB SA: 30 | 15 days supply | Qty: 30 | Fill #0

## 2015-07-10 MED FILL — MIRTAZAPINE 15 MG TABLET: 15 | 30 days supply | Qty: 30 | Fill #0

## 2015-07-10 MED FILL — hydrALAZINE HCL 25 MG TABS: 25 | 30 days supply | Qty: 90 | Fill #0

## 2015-07-10 MED FILL — CALCIUM ACETATE 667 MG CAP: 667 | 20 days supply | Qty: 120 | Fill #0

## 2015-07-10 MED FILL — METOPROLOL TARTRATE 50 MG T: 50 | 30 days supply | Qty: 60 | Fill #0

## 2015-07-10 NOTE — Care Management Note (Addendum)
Case Management Note  Patient Details  Name: Charles [name]  MRN: [id number] Date of Birth: 08/19/1975  Subjective/Objective:       ESRD, Multiple myeloma              Action/Plan: Discharge Planning: AVS reviewed:  NCM spoke to pt and son, Gerardo JosePlata at bedside. Son translated for pt. Pt states he not sure about PCP. Son states they pay for his meds out of pocket at the pharmacy and he receives meds from the [facility]. Used [facility] so pt can pick up meds $3.00 copay. NCM explained to pt's wife, Christina Plata via phone. Explained the pharmacy closes at 6 pm today.   PCP- John Moody MD   Expected Discharge Date: 07/10/2015           Expected Discharge Plan:  Home/Self Care  In-House Referral:  NA  Discharge planning Services  CM Consult  Post Acute Care Choice:  NA Choice offered to:  NA  DME Arranged:  N/A DME Agency:  NA  HH Arranged:  NA HH Agency:  NA  Status of Service:  Completed, signed off  Medicare Important Message Given:    Date Medicare IM Given:    Medicare IM give by:    Date Additional Medicare IM Given:    Additional Medicare Important Message give by:     If discussed at Long Length of Stay Meetings, dates discussed:    Additional Comments:  ,  Ellen, RN 07/10/2015, 4:17 PM  

## 2015-07-10 NOTE — Telephone Encounter (Signed)
per pof to sch pt appt-sent MW email to sch trmt-Julie will call pt with appt after reply

## 2015-07-10 NOTE — Discharge Summary (Signed)
Physician Discharge Summary  Charles [name]  OVF:[id number]  DOB: 1975/08/17  DOA: 06/23/2015  PCP: [name], MD  Admit date: 06/23/2015 Discharge date: 07/10/2015  Time spent: Greater than 30 minutes  Recommendations for Outpatient Follow-up:  1. Dr. [name], PCP in 1 week. Please follow final blood culture results as outpatient. 2. Hemodialysis Center: Keep regular hemodialysis appointments on Tuesdays/Thursdays/Saturdays. Next Hemodialysis appointment on 07/14/15. 3. He has appointment for AV fistula creation by vascular surgery on 07/13/15 and patient is advised to keep that appointment. 4. Patient has outpatient follow-up appointments with Dr. Lurline Del, Oncology 5. Outpatient follow-up with radiation oncology.  Discharge Diagnoses:  Principal Problem:   AKI (acute kidney injury) ([place]) Active Problems:   Multiple myeloma (HCC)   CKD (chronic kidney disease) stage 3, GFR 30-59 ml/min   Nausea with vomiting   Blurry vision, bilateral   HTN (hypertension), malignant   PRES (posterior reversible encephalopathy syndrome)   FUO (fever of unknown origin)   Back pain   Multiple myeloma not having achieved remission (HCC)   Multiple myeloma in relapse ([place])   Symptomatic anemia   Discharge Condition: Improved & Stable  Diet recommendation: Heart healthy diet.  Filed Weights   07/08/15 1040 07/08/15 2146 07/10/15 0700  Weight: 64 kg (141 lb 1.5 oz) 64.5 kg (142 lb 3.2 oz) 65.3 kg (143 lb 15.4 oz)    History of present illness:  Charles [name] is a 40 y.o. male with medical history significant of Multiple Myeloma, CKD 3, back pain, currently undergoing chemotherapy, reports he was started on a new chemotherapeutic agent recently. Patient admitted with Nausea, vomiting, diarrhea. Thought to be to chemo related. Subsequently he developed PRES syndrome, Malignant HTN, visual disturbance and possible PNA. Neurology consulted. He was started on  Vancomycin and cefepime. Due to ongoing fevers, ID consulted. Fever was deemed to be tumor fever, and antibiotics were stopped. He developed myeloma kidney with ARF and he started hemodialysis. He was transferred to [facility] from [facility] for this reason. He has completed several treatments of HD and CLIP process has been completed. He will start hemodialysis on Tuesday next week as an outpatient. Also, Mri spine showed MM progression and cord involvement and he will undergo palliative XRT as an outpatient.  Hospital Course:   Multiple myeloma ([place]) in relapse, started carfilzomib, cyclophosphamide, dexamethasone, cycle 1 started on 4/27, however, he did not complete a full cycle because he was intolerant of cyclophosphamide. Oncologist feels that he may have a response to this chemotherapy, but given his degree of illness with ongoing fevers, need for dialysis, diminished appetite, his prognosis is guarded.  - Cycle 2 started on 5/31, and lenalidomide was added. Carfilzomid dosed on days 1, 2, 8, 9, 15, 16, followed by one week break - Received cycle of chemotherapy as inpatient on 6/7 & 6/8. - Will take about 2-3 months to see a response - Good candidate for stem-cell transplant, but does not have insurance - Continue acyclovir proph  - Appreciate oncology assistance: Discussed with Dr.Magrinat who will closely follow patient as outpatient for further treatment. Patient has next appointment on 07/15/15.  Multiple lytic lesions throughout and spinal cord dural involvement at T7 and T10-11 - Resume XRT with daily (M-F) treatments at discharge.   ESRD, secondary to Myeloma Kidney - Started HD on 6/2 - HD center arranged, patient to arrive on Monday to complete paperwork - Discussed with Dr. Joelyn Oms, Nephrology was cleared patient for discharge and indicates that patient  may have his AV fistula placed as outpatient. Nephrology is happy to coordinate that if there is any  problem.  HTN Emergency, PRES syndrome, possibly due to side effect from protesome inhibitor, resolved Patient report vision changes the morning 5-26, MRI consistent with PRES MRI brain demonstrates improvement in edema without hemorrhage or infarct off nicardipinegtt on 5/31 continue hydralazine 59m po tid, and metoprolol 577mpo bid SBP goal 120--140 Per neurology  Fevers likelysecondary to multiple myeloma, no clear pneumonia or UTI.  Pan CT unrevealing, MRI spine no infection  Spike fever again on 6/6: CXR unremarkable and UA with leukocytes and nitrites but difficult to interpret in setting of ESRD - Blood cultures 2: Negative to date. Urine culture shows 30 K Escherichia coli but in the absence of symptoms, no antibiotics started. However if patient becomes symptomatic, spikes fever or becomes unstable, may consider repeating urine culture and treating for UTI. - ID signed off   Nausea with vomiting on admission, possibly due to chemotherapy - Ct ab no obstruction. - resolved   Right arm weakness, may be secondary to recent PRES, but has spinal cord involvement in other areas.  - MRI cervical spine: Diffusely abnormal bone marrow, consistent with multiple myeloma. No extra osseus or epidural tumor. No pathological fracture or other complication. No findings to explain RUE symptoms.  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 Hemoglobin 8.5.   Thyroid nodules: can follow up as oupatient  Back pain: stable, improved. Patient remains on MS Contin. Has required very little when necessary pain medications. Continue bowel regimen.  Leukocytosis, likely related to dexamethasone, but concerning for infection also. See above   Consultants:  Nephrology, oncology, neurology, Infectious disease, Vascular surgery, Interventional radiology  Procedures:  HD tunneled catheter placement on 6/2 Hemodialysis.   Discharge  Exam:  Complaints:  Seen at hemodialysis this morning. Denied complaints. No pain reported.  Filed Vitals:   07/10/15 1000 07/10/15 1030 07/10/15 1100 07/10/15 1108  BP: 126/80 136/81 125/75 141/83  Pulse: 77 57 57 75  Temp:    97.7 F (36.5 C)  TempSrc:    Oral  Resp:    18  Height:      Weight:      SpO2:    96%    General exam: Pleasant young male lying comfortably supine in bed undergoing HD this morning. Respiratory system: Clear to auscultation bilaterally. No increased work of breathing. Cardiovascular system: Regular rate and rhythm, normal S1/S2. No murmurs, rubs, gallops or clicks. Warm extremities. Right chest wall HD catheter with clean dressing, no surrounding erythema or induration. Gastrointestinal system: Normal active bowel sounds, soft, nondistended, nontender. MSK: Normal tone and bulk, no lower extremity edema. Neuro:strength 4/5 right hand. Left hand 5/5 and bilateral legs 5/5. Alert and oriented. No focal neurological deficits.  Discharge Instructions      Discharge Instructions    Call MD for:  difficulty breathing, headache or visual disturbances    Complete by:  As directed      Call MD for:  extreme fatigue    Complete by:  As directed      Call MD for:  persistant dizziness or light-headedness    Complete by:  As directed      Call MD for:  persistant nausea and vomiting    Complete by:  As directed      Call MD for:  redness, tenderness, or signs of infection (pain, swelling, redness, odor or green/yellow discharge around incision site)  Complete by:  As directed      Call MD for:  severe uncontrolled pain    Complete by:  As directed      Call MD for:  temperature >100.4    Complete by:  As directed      Diet - low sodium heart healthy    Complete by:  As directed      Increase activity slowly    Complete by:  As directed      PHYSICIAN COMMUNICATION ORDER    Complete by:  As directed   May increase carfilzomib dose to 27 mg/m2 if  20 mg/m2 dose cycle 1 is tolerated.     SCHEDULING COMMUNICATION    Complete by:  As directed   Chemotherapy Appointment  - 1.5 hour      SCHEDULING COMMUNICATION    Complete by:  As directed   Chemotherapy Appointment  - 1.5 hour      SCHEDULING COMMUNICATION    Complete by:  As directed   Chemotherapy Appointment  - 1.5 hour      STEROID NURSING COMMUNICATION    Complete by:  As directed   May order dexamethasone 74m in infusion if pt did not take dose at home.     TREATMENT CONDITIONS    Complete by:  As directed   Patient should have CBC & CMP within 7 days prior to chemotherapy administration. NOTIFY MD IF: ANC < 1500, Hemoglobin < 8, PLT < 100,000,  Total Bili > 1.5, Creatinine > 1.5, ALT & AST > 80 or if patient has unstable vital signs: Temperature > 38.5, SBP > 180 or < 90, RR > 30 or HR > 100.     TREATMENT CONDITIONS    Complete by:  As directed   Patient should have CBC & CMP within 7 days prior to chemotherapy administration. NOTIFY MD IF: ANC < 1500, Hemoglobin < 8, PLT < 100,000,  Total Bili > 1.5, Creatinine > 1.5, ALT & AST > 80 or if patient has unstable vital signs: Temperature > 38.5, SBP > 180 or < 90, RR > 30 or HR > 100.            Medication List    TAKE these medications        acetaminophen 500 MG tablet  Commonly known as:  TYLENOL  Take 500 mg by mouth every 6 (six) hours as needed for mild pain, moderate pain, fever or headache.     acyclovir 200 MG capsule  Commonly known as:  ZOVIRAX  Take 2 capsules (400 mg total) by mouth daily.     calcium acetate 667 MG capsule  Commonly known as:  PHOSLO  Take 2 capsules (1,334 mg total) by mouth 3 (three) times daily with meals.     dexamethasone 4 MG tablet  Commonly known as:  DECADRON  Take 5 tablets (260m twice a week, not on treatment day (tome 5 pastillas dos veces a la semana, no en el dia de tratamiento). Or as per Oncology recommendations.     feeding supplement (NEPRO CARB STEADY) Liqd   Take 237 mLs by mouth 2 (two) times daily between meals.     feeding supplement (PRO-STAT SUGAR FREE 64) Liqd  Take 30 mLs by mouth 2 (two) times daily.     hydrALAZINE 25 MG tablet  Commonly known as:  APRESOLINE  Take 1 tablet (25 mg total) by mouth 3 (three) times daily.     lenalidomide 10 MG  capsule  Commonly known as:  REVLIMID  Take 1 capsule (10 mg total) by mouth every other day. Or as per Oncology recommendations.     metoprolol 50 MG tablet  Commonly known as:  LOPRESSOR  Take 1 tablet (50 mg total) by mouth 2 (two) times daily.     mirtazapine 15 MG tablet  Commonly known as:  REMERON  Take 1 tablet (15 mg total) by mouth at bedtime.     morphine 30 MG 12 hr tablet  Commonly known as:  MS CONTIN  Take 1 tablet (30 mg total) by mouth every 12 (twelve) hours.     multivitamin Tabs tablet  Take 1 tablet by mouth at bedtime.     polyethylene glycol packet  Commonly known as:  MIRALAX / GLYCOLAX  Take 17 g by mouth daily.     prochlorperazine 10 MG tablet  Commonly known as:  COMPAZINE  Take 1 tablet (10 mg total) by mouth every 6 (six) hours as needed for nausea or vomiting.     senna-docusate 8.6-50 MG tablet  Commonly known as:  Senokot-S  Take 2 tablets by mouth 2 (two) times daily.     traMADol 50 MG tablet  Commonly known as:  ULTRAM  Take 1 tablet (50 mg total) by mouth every 6 (six) hours as needed.     VITAMIN D PO  Take 500 Units by mouth daily.       Follow-up Information    Follow up with [name], MD. Schedule an appointment as soon as possible for a visit in 1 week.   Specialty:  Internal Medicine   Contact information:   [place] [address] [phone]       Follow up with Hemodialysis center.   Why:  Keep regular hemodialysis appointments on Tuesdays/Thursdays/Saturdays. Next dialysis on 07/14/14.      Get Medicines reviewed and adjusted: Please take all your medications with you for your next visit  with your Primary MD  Please request your Primary MD to go over all hospital tests and procedure/radiological results at the follow up. Please ask your Primary MD to get all Hospital records sent to his/her office.  If you experience worsening of your admission symptoms, develop shortness of breath, life threatening emergency, suicidal or homicidal thoughts you must seek medical attention immediately by calling 911 or calling your MD immediately if symptoms less severe.  You must read complete instructions/literature along with all the possible adverse reactions/side effects for all the Medicines you take and that have been prescribed to you. Take any new Medicines after you have completely understood and accept all the possible adverse reactions/side effects.   Do not drive when taking pain medications.   Do not take more than prescribed Pain, Sleep and Anxiety Medications  Special Instructions: If you have smoked or chewed Tobacco in the last 2 yrs please stop smoking, stop any regular Alcohol and or any Recreational drug use.  Wear Seat belts while driving.  Please note  You were cared for by a hospitalist during your hospital stay. Once you are discharged, your primary care physician will handle any further medical issues. Please note that NO REFILLS for any discharge medications will be authorized once you are discharged, as it is imperative that you return to your primary care physician (or establish a relationship with a primary care physician if you do not have one) for your aftercare needs so that they can reassess your need for medications  and monitor your lab values.    The results of significant diagnostics from this hospitalization (including imaging, microbiology, ancillary and laboratory) are listed below for reference.    Significant Diagnostic Studies: Ct Abdomen Pelvis Wo Contrast  06/30/2015  CLINICAL DATA:  History of multiple myeloma with chronic kidney disease stage  3 and back pain currently undergoing chemotherapy. Nausea, vomiting, diarrhea, fever and chills four days. EXAM: CT CHEST, ABDOMEN AND PELVIS WITHOUT CONTRAST TECHNIQUE: Multidetector CT imaging of the chest, abdomen and pelvis was performed following the standard protocol without IV contrast. COMPARISON:  CT 06/23/2015, 02/03/2014 and 01/30/2014 FINDINGS: CT CHEST Lungs are adequately inflated demonstrate a small amount of bilateral pleural fluid left greater than right with associated atelectasis in the posterior lung bases left greater than right. There are new multiple bilateral well-defined focal pleural masses right greater than left with the largest over the right apex measuring 2.8 x 5.2 cm. These likely represent spread of patient's known multiple myeloma. Airways are normal. Heart is normal size. No definite mediastinal or hilar adenopathy. Small amount contrast within the mid to distal esophagus likely due to dysmotility versus reflux. Remaining mediastinal structures are unremarkable. There are couple hypodense thyroid nodules with the largest measuring 1.2 cm over the left lobe. Patient's known destructive sternal manubrial lesion is unchanged. There is subtle increased lucency over the body of the sternum which is new likely spread of patient's known myeloma. There are several lucencies over the thoracic spine with the most prominent over the T9 vertebral body. CT ABDOMEN AND PELVIS The liver, spleen, gallbladder, pancreas and adrenal glands are within normal. Kidneys normal size without hydronephrosis or nephrolithiasis. Stable exophytic hypodensity over the mid pole cortex of the left kidney likely a cyst. Ureters are normal. Stomach is within normal. Small bowel is normal. Appendix is normal. Subtle diverticulosis of the colon most prominent over the right colon. There is no significant free fluid or focal inflammatory change. Few small periaortic lymph nodes. Vascular structures are unremarkable.  Pelvic images demonstrate the bladder, prostate and rectum to be within normal. Ill-defined low-attenuation over the right iliac bone likely a myelomatous lesion. Ill-defined lytic process over the right femoral neck spanning nearly the entire width of the femoral neck increasing risk of pathologic fracture. Bone island over the left sacrum. IMPRESSION: No acute findings in the chest, abdomen or pelvis. Lytic lesions within the sternum, spine, right iliac bone and right femoral neck compatible with patient's known myeloma. Note that patient is at risk for pathologic fracture through the right femoral neck. Multiple new bilateral focal pleural masses with the largest over the right apex measuring 2.8 x 5.2 cm likely metastatic disease from patient's multiple myeloma. Small amount of bilateral pleural fluid bibasilar consolidation most typical of atelectasis. Minimal diverticulosis of the colon. Stable 1.3 cm exophytic hypodensity over the mid pole left kidney likely a cyst. Couple small hypodense thyroid nodules with the larger over the left lobe measuring 1.2 cm. Recommend follow-up as clinically indicated. Electronically Signed   By: Marin Olp M.D.   On: 06/30/2015 17:24   Dg Chest 2 View  07/07/2015  CLINICAL DATA:  Fever and leukocytosis EXAM: CHEST  2 VIEW COMPARISON:  07/06/2015 FINDINGS: Cardiac shadow is stable. Dialysis catheter is again seen. The inspiratory effort is improved with improvement of the basilar atelectasis. There remains soft tissue dense along the right chest wall consistent with the given clinical history. IMPRESSION: Slightly improved aeration with resolution of previously seen atelectasis Electronically Signed  By: Inez Catalina M.D.   On: 07/07/2015 19:05   Dg Chest 2 View  06/25/2015  CLINICAL DATA:  Pt complains of Nausea, Vomiting, diarrhea, fever and chills x 4 days. Hx of multiple myeloma and on chemo. EXAM: CHEST  2 VIEW COMPARISON:  Multiple prior studies including  01/30/2014 and 01/21/2015 FINDINGS: Shallow lung inflation. The heart is mildly prominent in size. There are small bilateral pleural effusions. Minimal streaky density identified at the lung bases, left greater than right. There is right apical extrapleural soft tissue density. Rounded soft tissue density overlies the right posterior lateral fifth rib and there is possible expansion of the right anterior fifth rib. There is a remote fracture of the right second anterior rib. IMPRESSION: 1. Interval change in the appearance of the chest. 2. Increased soft tissue masses and possible expansion of the right anterior fifth rib. 3. 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 Consider further evaluation with CT of the chest as needed. 4. Bilateral lower lobe atelectasis and/or infiltrates, left greater than right. Electronically Signed   By: Nolon Nations M.D.   On: 06/25/2015 15:58   Ct Chest Wo Contrast  06/30/2015  CLINICAL DATA:  History of multiple myeloma with chronic kidney disease stage 3 and back pain currently undergoing chemotherapy. Nausea, vomiting, diarrhea, fever and chills four days. EXAM: CT CHEST, ABDOMEN AND PELVIS WITHOUT CONTRAST TECHNIQUE: Multidetector CT imaging of the chest, abdomen and pelvis was performed following the standard protocol without IV contrast. COMPARISON:  CT 06/23/2015, 02/03/2014 and 01/30/2014 FINDINGS: CT CHEST Lungs are adequately inflated demonstrate a small amount of bilateral pleural fluid left greater than right with associated atelectasis in the posterior lung bases left greater than right. There are new multiple bilateral well-defined focal pleural masses right greater than left with the largest over the right apex measuring 2.8 x 5.2 cm. These likely represent spread of patient's known multiple myeloma. Airways are normal. Heart is normal size. No definite mediastinal or hilar adenopathy. Small amount contrast within the mid to distal esophagus likely due to dysmotility  versus reflux. Remaining mediastinal structures are unremarkable. There are couple hypodense thyroid nodules with the largest measuring 1.2 cm over the left lobe. Patient's known destructive sternal manubrial lesion is unchanged. There is subtle increased lucency over the body of the sternum which is new likely spread of patient's known myeloma. There are several lucencies over the thoracic spine with the most prominent over the T9 vertebral body. CT ABDOMEN AND PELVIS The liver, spleen, gallbladder, pancreas and adrenal glands are within normal. Kidneys normal size without hydronephrosis or nephrolithiasis. Stable exophytic hypodensity over the mid pole cortex of the left kidney likely a cyst. Ureters are normal. Stomach is within normal. Small bowel is normal. Appendix is normal. Subtle diverticulosis of the colon most prominent over the right colon. There is no significant free fluid or focal inflammatory change. Few small periaortic lymph nodes. Vascular structures are unremarkable. Pelvic images demonstrate the bladder, prostate and rectum to be within normal. Ill-defined low-attenuation over the right iliac bone likely a myelomatous lesion. Ill-defined lytic process over the right femoral neck spanning nearly the entire width of the femoral neck increasing risk of pathologic fracture. Bone island over the left sacrum. IMPRESSION: No acute findings in the chest, abdomen or pelvis. Lytic lesions within the sternum, spine, right iliac bone and right femoral neck compatible with patient's known myeloma. Note that patient is at risk for pathologic fracture through the right femoral neck. Multiple new bilateral focal pleural masses  with the largest over the right apex measuring 2.8 x 5.2 cm likely metastatic disease from patient's multiple myeloma. Small amount of bilateral pleural fluid bibasilar consolidation most typical of atelectasis. Minimal diverticulosis of the colon. Stable 1.3 cm exophytic hypodensity over  the mid pole left kidney likely a cyst. Couple small hypodense thyroid nodules with the larger over the left lobe measuring 1.2 cm. Recommend follow-up as clinically indicated. Electronically Signed   By: Marin Olp M.D.   On: 06/30/2015 17:24   Mr Brain Wo Contrast  07/06/2015  CLINICAL DATA:  Multiple myeloma undergoing chemotherapy. Severe nausea and vomiting. Posterior reversible encephalopathy. Follow-up. EXAM: MRI HEAD WITHOUT CONTRAST TECHNIQUE: Multiplanar, multiecho pulse sequences of the brain and surrounding structures were obtained without intravenous contrast. COMPARISON:  06/26/2015 FINDINGS: Diffusion imaging does not show any acute or subacute infarction. There is resolving cortical and subcortical edema of the brain bilaterally in a posterior distribution. No evidence of hemorrhagic transformation. No evidence of hydrocephalus or extra-axial collection. Mucosal thickening and a small amount of fluid in the right maxillary sinus again noted. Multiple calvarial lesions unchanged since the previous study. IMPRESSION: Cortical and subcortical edema primarily affecting the posterior brain, improving when compared to the study of 06/26/2015, as expected with successful treatment/resolution of posterior reversible encephalopathy. No evidence of infarctions or hemorrhagic change. Multiple myeloma skull lesions as seen previously. Electronically Signed   By: Nelson Chimes M.D.   On: 07/06/2015 20:16   Mr Brain Wo Contrast  06/26/2015  CLINICAL DATA:  Bilateral blurry vision. Fever. History of multiple myeloma on chemotherapy. Elevated blood pressures. EXAM: MRI HEAD WITHOUT CONTRAST TECHNIQUE: Multiplanar, multiecho pulse sequences of the brain and surrounding structures were obtained without intravenous contrast. COMPARISON:  Head CT 10/11/2010 FINDINGS: There is abnormal T2 hyperintensity involving cortex and subcortical white matter in both occipital lobes and left greater than right parietal  lobes. No restricted diffusion or hemorrhage is seen. The brain is otherwise normal in signal aside from scattered, punctate foci of T2 hyperintensity in the cerebral white matter which are nonspecific. No mass, midline shift, or extra-axial fluid collection is seen. Ventricles are normal in size. Orbits are unremarkable. A small amount of fluid is present in the right maxillary sinus. The mastoid air cells are clear. Major intracranial vascular flow voids are preserved. There is a 1.4 cm left frontal skull lesion with extension through the inner and outer tables of the skull and small volume epidural tumor without significant mass effect on the underlying brain. No brain edema. Multiple other, smaller lesions are scattered elsewhere in the skull. Baseline bone marrow signal is diffusely diminished throughout the skull and visualized upper cervical spine and may be related to ongoing treatment. IMPRESSION: 1. 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 2. Multiple skull lesions consistent with known multiple myeloma. 1.4 cm left frontal lesion with mild epidural extension. Electronically Signed   By: Logan Bores M.D.   On: 06/26/2015 12:22   Mr Cervical Spine Wo Contrast  07/09/2015  CLINICAL DATA:  Initial evaluation for right arm weakness. EXAM: MRI CERVICAL SPINE WITHOUT CONTRAST TECHNIQUE: Multiplanar, multisequence MR imaging of the cervical spine was performed. No intravenous contrast was administered. COMPARISON:  None. FINDINGS: Alignment: Ir to row bodies normally aligned with preservation of the normal cervical lordosis. No listhesis or malalignment. Vertebrae: Diffusely abnormal bone marrow with multiple osseous lesions present, consistent with known multiple myeloma. No extra osseous extension of tumor. No epidural tumor. No pathologic fracture.  Cord: Signal intensity within the cervical spinal cord is normal. Posterior Fossa, vertebral arteries, paraspinal  tissues: Visualized posterior fossa a grossly unremarkable. Changes related to known pressed are not well appreciated on this exam. Craniocervical junction normal. Paraspinous soft tissues within normal limits. No prevertebral or paravertebral edema. Normal intravascular flow voids present within the vertebral arteries bilaterally. Disc levels: C2-C3: Negative. C3-C4: Mild bilateral uncovertebral hypertrophy, left slightly worse than right. Mild left foraminal stenosis. No canal or right foraminal narrowing. C4-C5: Mild degenerative disc bulge with bilateral uncovertebral hypertrophy. No significant canal stenosis. Minimal bilateral foraminal narrowing. C5-C6:  Minimal uncovertebral hypertrophy.  No stenosis. C6-C7:  Negative. C7-T1:  Negative. IMPRESSION: 1. Diffusely abnormal bone marrow, consistent with known history of multiple myeloma. No extraosseous or epidural tumor. No pathologic fracture or other complication. 2. Relatively mild multilevel degenerative spondylolysis as above. No significant canal or foraminal stenosis. No findings to explain right upper extremity symptoms. Electronically Signed   By: Jeannine Boga M.D.   On: 07/09/2015 01:48   Mr Thoracic Spine Wo Contrast  07/02/2015  CLINICAL DATA:  Multiple myeloma.  Back pain.  Fever EXAM: MRI THORACIC AND LUMBAR SPINE WITHOUT CONTRAST TECHNIQUE: Multiplanar and multiecho pulse sequences of the thoracic and lumbar spine were obtained without intravenous contrast. COMPARISON:  CT chest abdomen and pelvis 06/30/2015 FINDINGS: MRI THORACIC SPINE FINDINGS Alignment:  Normal Vertebrae: Diffuse infiltration of the bone marrow throughout all vertebral bodies consistent with myeloma. This also involves the posterior elements and ribs bilaterally. There is extradural tumor in the spinal canal posteriorly at T4, T7, and T 10-11. Extraosseous mass lesions in the ribs bilaterally consistent with myeloma. Cord: Posterior extradural mass at the T4 level  causing mild spinal stenosis. Posterior extradural mass at the T7 level causing moderate spinal stenosis and mild compression of the cord. Cord has normal signal. Mild posterior extradural tumor at T10-11 extending to the right. This is causing right foraminal stenosis. No significant spinal stenosis. Paraspinal and other soft tissues: Paraspinous muscles normal. Small bilateral pleural effusions. Diffuse rib infiltration by mall myeloma with multiple extradural rib lesions. Disc levels: Negative for disc protrusion. No significant disc degeneration. MRI LUMBAR SPINE FINDINGS Segmentation:  Normal.  Lowest disc space L5-S1 Alignment:  Normal Vertebrae: Diffuse infiltration of the bone marrow by myeloma. This involves all vertebral bodies and extends into the posterior elements. There is involvement of the pelvis. Sacrum is diffusely involved. Negative for fracture. Conus medullaris: Extends to the L1 level and appears normal. Paraspinal and other soft tissues: No retroperitoneal adenopathy. Extraosseous mass from the left iliac wing displacing iliopsoas anteriorly Disc levels: L1-2:  Negative L2-3:  Negative L3-4:  Bilateral facet degeneration.  Normal disc space L4-5:  Bilateral facet degeneration.  Normal disc space L5-S1: Small central disc protrusion without neural impingement. IMPRESSION: MR THORACIC SPINE IMPRESSION Extensive and diffuse infiltration of the bone marrow with myeloma. No fracture. Posterior extradural mass the T4 level causing mild spinal stenosis. Posterior epidural mass at the T7 level causing moderate spinal stenosis and mild compression of the cord. MR LUMBAR SPINE IMPRESSION Diffuse bone marrow infiltration throughout the lumbar spine, sacrum, and pelvis. No fracture. No tumor is seen in the lumbar spinal canal. No evidence of infection in the thoracic or lumbar spine. Electronically Signed   By: Franchot Gallo M.D.   On: 07/02/2015 09:00   Mr Lumbar Spine Wo Contrast  07/02/2015   CLINICAL DATA:  Multiple myeloma.  Back pain.  Fever EXAM: MRI THORACIC AND LUMBAR  SPINE WITHOUT CONTRAST TECHNIQUE: Multiplanar and multiecho pulse sequences of the thoracic and lumbar spine were obtained without intravenous contrast. COMPARISON:  CT chest abdomen and pelvis 06/30/2015 FINDINGS: MRI THORACIC SPINE FINDINGS Alignment:  Normal Vertebrae: Diffuse infiltration of the bone marrow throughout all vertebral bodies consistent with myeloma. This also involves the posterior elements and ribs bilaterally. There is extradural tumor in the spinal canal posteriorly at T4, T7, and T 10-11. Extraosseous mass lesions in the ribs bilaterally consistent with myeloma. Cord: Posterior extradural mass at the T4 level causing mild spinal stenosis. Posterior extradural mass at the T7 level causing moderate spinal stenosis and mild compression of the cord. Cord has normal signal. Mild posterior extradural tumor at T10-11 extending to the right. This is causing right foraminal stenosis. No significant spinal stenosis. Paraspinal and other soft tissues: Paraspinous muscles normal. Small bilateral pleural effusions. Diffuse rib infiltration by mall myeloma with multiple extradural rib lesions. Disc levels: Negative for disc protrusion. No significant disc degeneration. MRI LUMBAR SPINE FINDINGS Segmentation:  Normal.  Lowest disc space L5-S1 Alignment:  Normal Vertebrae: Diffuse infiltration of the bone marrow by myeloma. This involves all vertebral bodies and extends into the posterior elements. There is involvement of the pelvis. Sacrum is diffusely involved. Negative for fracture. Conus medullaris: Extends to the L1 level and appears normal. Paraspinal and other soft tissues: No retroperitoneal adenopathy. Extraosseous mass from the left iliac wing displacing iliopsoas anteriorly Disc levels: L1-2:  Negative L2-3:  Negative L3-4:  Bilateral facet degeneration.  Normal disc space L4-5:  Bilateral facet degeneration.  Normal  disc space L5-S1: Small central disc protrusion without neural impingement. IMPRESSION: MR THORACIC SPINE IMPRESSION Extensive and diffuse infiltration of the bone marrow with myeloma. No fracture. Posterior extradural mass the T4 level causing mild spinal stenosis. Posterior epidural mass at the T7 level causing moderate spinal stenosis and mild compression of the cord. MR LUMBAR SPINE IMPRESSION Diffuse bone marrow infiltration throughout the lumbar spine, sacrum, and pelvis. No fracture. No tumor is seen in the lumbar spinal canal. No evidence of infection in the thoracic or lumbar spine. Electronically Signed   By: Franchot Gallo M.D.   On: 07/02/2015 09:00   US Renal Port  06/24/2015  CLINICAL DATA:  Acute kidney injury. EXAM: RENAL / URINARY TRACT ULTRASOUND COMPLETE COMPARISON:  CT scan of Jun 23, 2015. FINDINGS: Right Kidney: Length: 12.1 cm. Mildly increased echogenicity of renal parenchyma is noted. No mass or hydronephrosis visualized. Left Kidney: Length: 11.9 cm. Mildly increased echogenicity of renal parenchyma is noted. 1.6 cm simple exophytic cyst is seen arising from midpole. No mass or hydronephrosis visualized. Bladder: Appears normal for degree of bladder distention. IMPRESSION: Mildly increased echogenicity of renal parenchyma is noted bilaterally suggesting medical renal disease. No hydronephrosis or renal obstruction is noted. Electronically Signed   By: Marijo Conception, M.D.   On: 06/24/2015 09:50   Ir Fluoro Guide Cv Line Right  07/03/2015  INDICATION: 40 year old male with a history of renal failure. He has been referred for hemodialysis catheter placement. EXAM: TUNNELED CENTRAL VENOUS HEMODIALYSIS CATHETER PLACEMENT WITH ULTRASOUND AND FLUOROSCOPIC GUIDANCE MEDICATIONS: 2.0 g Ancef . The antibiotic was given in an appropriate time interval prior to skin puncture. ANESTHESIA/SEDATION: Moderate (conscious) sedation was employed during this procedure. A total of Versed 1.5 mg and  Fentanyl 25 mcg was administered intravenously. Moderate Sedation Time: 15 minutes. The patient's level of consciousness and vital signs were monitored continuously by radiology nursing throughout the procedure under my  direct supervision. FLUOROSCOPY TIME:  Fluoroscopy Time: 0 minutes 6 seconds (2 mGy). COMPLICATIONS: None PROCEDURE: Informed written consent was obtained from the patient after a discussion of the risks, benefits, and alternatives to treatment. Questions regarding the procedure were encouraged and answered. The right neck and chest were prepped with chlorhexidine in a sterile fashion, and a sterile drape was applied covering the operative field. Maximum barrier sterile technique with sterile gowns and gloves were used for the procedure. A timeout was performed prior to the initiation of the procedure. After creating a small venotomy incision, a micropuncture kit was utilized to access the right internal jugular vein under direct, real-time ultrasound guidance after the overlying soft tissues were anesthetized with 1% lidocaine with epinephrine. Ultrasound image documentation was performed. The microwire was marked to measure appropriate internal catheter length. External tunneled length was estimated. A total tip to cuff length of 19 cm was selected. Skin and subcutaneous tissues of chest wall below the clavicle were generously infiltrated with 1% lidocaine for local anesthesia. A small stab incision was made with 11 blade scalpel. The selected hemodialysis catheter was tunneled in a retrograde fashion from the anterior chest wall to the venotomy incision. A guidewire was advanced to the level of the IVC and the micropuncture sheath was exchanged for a peel-away sheath. The catheter was then placed through the peel-away sheath with tips ultimately positioned within the superior aspect of the right atrium. Final catheter positioning was confirmed and documented with a spot radiographic image. The  catheter aspirates and flushes normally. The catheter was flushed with appropriate volume heparin dwells. The catheter exit site was secured with a 0-Prolene retention suture. The venotomy incision was closed Derma bond and sterile dressing. Dressings were applied at the chest wall. Patient tolerated the procedure well and remained hemodynamically stable throughout. No complications were encountered and no significant blood loss encountered. IMPRESSION: Status post placement of right IJ approach hemodialysis catheter measuring 19 cm tip to cuff. Catheter ready for use. Signed, Dulcy Fanny. Earleen Newport, DO [facility] Electronically Signed   By: Corrie Mckusick D.O.   On: 07/03/2015 18:01   Ir US Guide Vasc Access Right  07/03/2015  INDICATION: 40 year old male with a history of renal failure. He has been referred for hemodialysis catheter placement. EXAM: TUNNELED CENTRAL VENOUS HEMODIALYSIS CATHETER PLACEMENT WITH ULTRASOUND AND FLUOROSCOPIC GUIDANCE MEDICATIONS: 2.0 g Ancef . The antibiotic was given in an appropriate time interval prior to skin puncture. ANESTHESIA/SEDATION: Moderate (conscious) sedation was employed during this procedure. A total of Versed 1.5 mg and Fentanyl 25 mcg was administered intravenously. Moderate Sedation Time: 15 minutes. The patient's level of consciousness and vital signs were monitored continuously by radiology nursing throughout the procedure under my direct supervision. FLUOROSCOPY TIME:  Fluoroscopy Time: 0 minutes 6 seconds (2 mGy). COMPLICATIONS: None PROCEDURE: Informed written consent was obtained from the patient after a discussion of the risks, benefits, and alternatives to treatment. Questions regarding the procedure were encouraged and answered. The right neck and chest were prepped with chlorhexidine in a sterile fashion, and a sterile drape was applied covering the operative field. Maximum barrier sterile technique with  sterile gowns and gloves were used for the procedure. A timeout was performed prior to the initiation of the procedure. After creating a small venotomy incision, a micropuncture kit was utilized to access the right internal jugular vein under direct, real-time ultrasound guidance after the overlying soft tissues were anesthetized with 1% lidocaine with epinephrine. Ultrasound  image documentation was performed. The microwire was marked to measure appropriate internal catheter length. External tunneled length was estimated. A total tip to cuff length of 19 cm was selected. Skin and subcutaneous tissues of chest wall below the clavicle were generously infiltrated with 1% lidocaine for local anesthesia. A small stab incision was made with 11 blade scalpel. The selected hemodialysis catheter was tunneled in a retrograde fashion from the anterior chest wall to the venotomy incision. A guidewire was advanced to the level of the IVC and the micropuncture sheath was exchanged for a peel-away sheath. The catheter was then placed through the peel-away sheath with tips ultimately positioned within the superior aspect of the right atrium. Final catheter positioning was confirmed and documented with a spot radiographic image. The catheter aspirates and flushes normally. The catheter was flushed with appropriate volume heparin dwells. The catheter exit site was secured with a 0-Prolene retention suture. The venotomy incision was closed Derma bond and sterile dressing. Dressings were applied at the chest wall. Patient tolerated the procedure well and remained hemodynamically stable throughout. No complications were encountered and no significant blood loss encountered. IMPRESSION: Status post placement of right IJ approach hemodialysis catheter measuring 19 cm tip to cuff. Catheter ready for use. Signed, Dulcy Fanny. Earleen Newport, DO [facility] Electronically Signed   By: Corrie Mckusick D.O.   On: 07/03/2015 18:01   Dg Chest Port 1 View  07/06/2015  CLINICAL DATA:  Status post dialysis EXAM: PORTABLE CHEST 1 VIEW COMPARISON:  06/25/2015 FINDINGS: Cardiac shadow is mildly enlarged. A new dialysis catheter is noted. Lungs are hypoinflated with mild basilar atelectasis. No focal confluent infiltrate is noted. Stable nodular changes are noted on the right related to pleural based lesions IMPRESSION: Mild bibasilar atelectasis. The remainder of the exam is relatively stable from the prior study. Electronically Signed   By: Inez Catalina M.D.   On: 07/06/2015 18:17   Ct Renal Stone Study  06/23/2015  CLINICAL DATA:  History multiple myeloma currently on chemotherapy. Chronic kidney disease. Nausea, vomiting and diarrhea 4 days. Generalized abdominal pain with chills and weakness as well as dysuria and dark urine. Mid back pain. EXAM: CT ABDOMEN AND PELVIS WITHOUT CONTRAST TECHNIQUE: Multidetector CT imaging of the abdomen and pelvis was performed following the standard protocol without IV contrast. COMPARISON:  12/28/2014 and 02/03/2014 FINDINGS: Lung bases demonstrate very small bilateral pleural effusions with associated dependent atelectasis. Minimal cardiomegaly. The Abdominal images demonstrate the liver, spleen pancreas, gallbladder and adrenal glands to be within normal. Vascular structures are within normal. Kidneys are normal in size without hydronephrosis or nephrolithiasis. 1.3 cm exophytic cyst over the mid pole of the left kidney. Ureters are normal. Stomach is normal. Appendix is normal. Small bowel is within normal. There is mild diverticulosis of the colon. Pelvic images demonstrate the bladder, prostate and rectum to be within normal. Stable 1 cm sclerotic focus over the left sacrum likely a bone island. Old right lower anterior rib fracture. Focal lucency along the left side of the T9 vertebral body with subtle patchy low density T7 and T8 not well seen previously and may  represent evidence of patient's multiple myeloma. IMPRESSION: No acute findings in the abdomen/ pelvis. Subtle lucencies over the T7, T8 and T9 vertebral bodies not well seen previously and likely due to patient's known multiple myeloma. No compression fracture. Small bilateral pleural effusions with associated basilar atelectasis. Mild diverticulosis of the colon. 1.3 cm left renal cyst. Electronically  Signed   By: Marin Olp M.D.   On: 06/23/2015 17:33    Microbiology: Recent Results (from the past 240 hour(s))  Culture, blood (Routine X 2) w Reflex to ID Panel     Status: None (Preliminary result)   Collection Time: 07/07/15  6:15 PM  Result Value Ref Range Status   Specimen Description BLOOD RIGHT ANTECUBITAL  Final   Special Requests BOTTLES DRAWN AEROBIC ONLY 10CC  Final   Culture NO GROWTH 2 DAYS  Final   Report Status PENDING  Incomplete  Culture, blood (Routine X 2) w Reflex to ID Panel     Status: None (Preliminary result)   Collection Time: 07/07/15  6:30 PM  Result Value Ref Range Status   Specimen Description BLOOD BLOOD RIGHT HAND  Final   Special Requests BOTTLES DRAWN AEROBIC AND ANAEROBIC 10CC   Final   Culture NO GROWTH 2 DAYS  Final   Report Status PENDING  Incomplete  Culture, Urine     Status: Abnormal (Preliminary result)   Collection Time: 07/07/15  7:08 PM  Result Value Ref Range Status   Specimen Description URINE, RANDOM  Final   Special Requests NONE  Final   Culture 30,000 COLONIES/mL ESCHERICHIA COLI (A)  Final   Report Status PENDING  Incomplete     Labs: Basic Metabolic Panel:  Recent Labs Lab 07/04/15 1321  07/05/15 2355 07/06/15 1345 07/07/15 1815 07/08/15 0716 07/10/15 0727  NA 134*  < > 134* 133* 133* 133* 137  K 4.3  < > 4.8 4.5 4.5 4.2 4.1  CL 101  < > 97* 96* 95* 94* 98*  CO2 22  < > 24 23 28 25 25   GLUCOSE 93  < > 157* 135* 97 83 117*  BUN 85*  < > 73* 89* 63* 75* 84*  CREATININE 5.81*  < > 6.08* 6.87* 5.60* 6.51* 7.26*  CALCIUM  7.6*  < > 7.7* 7.4* 8.3* 8.4* 7.1*  PHOS 8.9*  --  8.4* 9.6*  --  8.4* 7.2*  < > = values in this interval not displayed. Liver Function Tests:  Recent Labs Lab 07/05/15 0434 07/05/15 2355 07/06/15 1345 07/07/15 1815 07/08/15 0716 07/10/15 0727  AST 16  --   --  16  --   --   ALT 17  --   --  14*  --   --   ALKPHOS 275*  --   --  203*  --   --   BILITOT 0.5  --   --  1.2  --   --   PROT 5.3*  --   --  5.0*  --   --   ALBUMIN 1.2* 1.3* 1.3* 1.2* 1.1* 1.2*   No results for input(s): LIPASE, AMYLASE in the last 168 hours. No results for input(s): AMMONIA in the last 168 hours. CBC:  Recent Labs Lab 07/05/15 0434 07/06/15 1345 07/07/15 1815 07/08/15 0716 07/10/15 0727  WBC 8.7 14.2* 11.5* 11.7* 8.5  NEUTROABS  --   --  7.9*  --   --   HGB 7.3* 7.7* 7.5* 7.1* 8.5*  HCT 23.7* 24.7* 24.2* 23.5* 27.1*  MCV 95.2 94.6 98.4 96.3 93.1  PLT 335 416* 354 359 218   Cardiac Enzymes: No results for input(s): CKTOTAL, CKMB, CKMBINDEX, TROPONINI in the last 168 hours. BNP: BNP (last 3 results) No results for input(s): BNP in the last 8760 hours.  ProBNP (last 3 results) No results for input(s): PROBNP in the last 8760  hours.  CBG: No results for input(s): GLUCAP in the last 168 hours.     Signed:  Vernell Leep, MD, FACP, FHM. [facility] Pager [phone]  If 7PM-7AM, please contact night-coverage [email or web address] Password [facility] 07/10/2015, 2:33 PM

## 2015-07-10 NOTE — Progress Notes (Signed)
Patient Discharge: Patient with discharge order. Son, who is [place] speaking present with patient to go over discharge instructions.  Disposition: Alert, ready and willing to go home. Stable.  Education: Instructions explained.  IV: Removed x 2  Telemetry: None  Follow-up appointments: Appts explained. Outpatient AV Fistula Surgery Monday, Hemodialysis on Tuesday. Primary within one week.  Prescriptions: In hand  Transportation: Family picked up patient.  Belongings: In tow with patient.

## 2015-07-10 NOTE — Progress Notes (Signed)
Patient to be discharged today. Dr Algis Liming and Dr. Joelyn Oms cleared patient to be discharged. Dr. Oneida Alar office called and notified. Laurence Slate PA with Dr. Oneida Alar office aware, and stated that they will arrange with patient to have procedure as outpatient.

## 2015-07-10 NOTE — Progress Notes (Signed)
Plan is for AV fistula placement left arm Monday 6/12. Preop orders written.  Dr Trula Slade on call this weekend if questions.  Ruta Hinds, MD [facility] [place] Office: [phone] Pager: [phone]

## 2015-07-10 NOTE — Telephone Encounter (Signed)
Per staff message and POF I have scheduled appts. Advised scheduler of appts. JMW  

## 2015-07-10 NOTE — Progress Notes (Signed)
OT Cancellation Note  Patient Details Name: Charles [name] MRN: [id number] DOB: 1975/09/08   Cancelled Treatment:    Reason Eval/Treat Not Completed: Patient at procedure or test/ unavailable. Pt at HD  Charles [name] 07/10/2015, 9:03 AM

## 2015-07-10 NOTE — Plan of Care (Signed)
Problem: Nutrition: Goal: Adequate nutrition will be maintained Outcome: Progressing Patient with increased appetite

## 2015-07-10 NOTE — Discharge Instructions (Addendum)
Enfermedad renal crnica (Chronic Kidney Disease) La enfermedad renal crnica se produce cuando los riones sufren un dao durante un largo perodo. Los riones son dos rganos que desempean muchas funciones importantes en el organismo. Estas funciones incluyen lo siguiente:  Eliminar desechos y el exceso de lquido de la [place].  Fabricar hormonas que ayudan a Curator.  Asegurarse de que el organismo tenga la cantidad correcta de lquidos y de sustancias qumicas. La enfermedad renal crnica puede deberse a muchos factores. El dao renal se produce lentamente. Si el dao es [place], los riones pueden dejar de funcionar como es debido. Esto es peligroso. El tratamiento puede ayudar a Therapist, occupational avance del dao y Product/process development scientist que [place]. CUIDADOS EN EL HOGAR  Siga la dieta segn las indicaciones de su mdico. Tal vez deba limitar la cantidad de sal (sodio) y de protenas que consume a diario.  Tome los medicamentos solamente como se lo haya indicado el mdico. No tome ningn medicamento nuevo, excepto que el mdico lo autorice.  Si fuma, deje de hacerlo. Pregntele al [employer] programas que pueden ayudarlo de dejar el hbito.  Contrlese con frecuencia presin arterial y [place]arry manager un registro de los [employer].  Comience o contine con un plan de ejercicios.  Aplquese las vacunas como se lo haya [place] y los minerales como se lo haya indicado el mdico.  Consulting civil engineer a todas las visitas de control como se lo haya indicado el mdico. Esto es importante. [place] DE INMEDIATO SI:   Los sntomas empeoran.  Aparecen nuevos sntomas.  Tiene los sntomas de la enfermedad renal en etapa terminal. Estos incluyen los siguientes:  Dolores de [place].  La piel ms oscura o ms clara que lo normal.  Entumecimiento en las manos o en los pies.  Aparecen hematomas con facilidad.  Hipo frecuente.  Las  mujeres pueden dejar de Librarian, academic.  Tiene fiebre.  Orina muy poco.  Siente dolor o tiene una hemorragia al Garment/textile technologist.   Esta informacin no tiene Marine scientist el consejo del mdico. Asegrese de hacerle al mdico cualquier pregunta que tenga.   Document Released: 02/19/2010 Document Revised: 10/08/2014 Elsevier Interactive Patient Education 2016 [place] sobre los analgsicos  (Pain Medicine Instructions)  CMO PUEDEN AFECTARME LOS ANALGSICOS?    Le recetaron analgsicos, medicamentos que pueden producirle cansancio o somnolencia, y Print production planner su capacidad de pensar con claridad. Tambin pueden afectar su capacidad de conducir vehculos o Archivist actividades fsicas. Tal vez no sea posible aliviarle el dolor por completo, pero debe sentirse lo suficientemente bien como para moverse, respirar y Air cabin crew de s mismo.  [place] LOS ANALGSICOS Y QU CANTIDAD DEBO TOMAR?  Tome los analgsicos nicamente como se lo haya indicado el mdico y solo si los necesita para Best boy.  No debe tomarlos si no siente dolor, a menos que se lo haya indicado el mdico.  Puede tomar una dosis ms baja de la que le recetaron si nota que una cantidad menor de analgsico mantiene el dolor bajo control. QU RESTRICCIONES TENGO MIENTRAS TOMO ANALGSICOS?  Siga estas indicaciones despus de empezar a tomar analgsicos, mientras los est tomando y [place] 14 horas despus de dejar de tomarlos:  No conduzca vehculos.  No opere maquinaria.  No utilice herramientas elctricas.  No firme documentos legales.  No beba alcohol.  No tome pastillas para dormir.  No cuide a los nios  solo.  No realice actividades que le exijan trepar o estar en lugares altos.  No se meta en el agua, por ejemplo, un lago, un ro, el ocano, un balneario o una piscina, sin que haya una persona adulta cerca que pueda vigilarlo y [place]. CMO PUEDO PROTEGER A OTRAS PERSONAS  MIENTRAS ESTOY TOMANDO ANALGSICOS?  Guarde los [employer] se lo haya indicado el mdico. Asegrese de colocarlos en un lugar donde los nios y las mascotas no puedan alcanzarlos.  Nunca comparta los analgsicos con Producer, television/film/video.  No guarde los comprimidos sobrantes. Si le sobran analgsicos, deseche o destruya el remanente como se lo haya indicado el mdico. QU MS DEBO SABER ACERCA DE LA TOMA DE ANALGSICOS?  Tome un laxante emoliente si los analgsicos lo estrien. Aumentar el consumo de frutas y verduras tambin puede ayudar con el estreimiento.  Anote los horarios en los que toma los analgsicos. Revise los horarios antes de tomar la siguiente dosis. Mientras toma analgsicos es normal sentirse confundido. Registrar los horarios lo ayuda a Product/process development scientist las sobredosis.  Si el dolor es intenso, no intente tratarlo usted mismo tomando ms comprimidos de los que indica la receta. Comunquese con el mdico para obtener ayuda.  Pueden recetarle un analgsico que contiene paracetamol. No tome ningn otro medicamento que contenga paracetamol mientras est tomando el que Programme researcher, broadcasting/film/video. Una sobredosis de paracetamol puede causar daos hepticos graves. Muchos medicamentos recetados y de venta libre contienen paracetamol. Si est tomando cualquier medicamento adems del analgsico, revise qu sustancias activas contiene para saber si el paracetamol es una de ellas. [place]?  Si el analgsico no ayuda a Best boy.  Si vomita o tiene diarrea poco despus de Physicist, medical.  Si le aparecen dolores nuevos en zonas que antes no le dolan.  Si tiene una reaccin alrgica al medicamento. Esto puede incluir lo siguiente:  Picazn.  [place].  Mareos.  Una nueva erupcin cutnea. [place]?  Si se siente mareado o se desmaya.  Si est muy confundido o desorientado.  Si vomita repetidas veces.  Si la piel o los labios  se le ponen plidos o de color azulado.  Si le falta el aire o respira mucho ms lentamente que lo habitual.  Si tiene una reaccin alrgica grave al analgsico. Esto incluye lo siguiente:  [place] de Barrister's clerk.  Dificultad para respirar. Esta informacin no tiene Marine scientist el consejo del mdico. Asegrese de hacerle al mdico cualquier pregunta que tenga.  Document Released: 10/27/2004 Document Revised: 06/03/2014  Elsevier Interactive Patient Education [employer].

## 2015-07-10 NOTE — Procedures (Signed)
I was present at this dialysis session. I have reviewed the session itself and made appropriate changes.   Goal UF 2L,  Will set post weight as tentative first outpt Target Weight. S/p 2u PRBC overnight.    Filed Weights   07/08/15 1040 07/08/15 2146 07/10/15 0700  Weight: 64 kg (141 lb 1.5 oz) 64.5 kg (142 lb 3.2 oz) 65.3 kg (143 lb 15.4 oz)     Recent Labs Lab 07/10/15 0727  NA 137  K 4.1  CL 98*  CO2 25  GLUCOSE 117*  BUN 84*  CREATININE 7.26*  CALCIUM 7.1*  PHOS 7.2*     Recent Labs Lab 07/03/15 0859  07/07/15 1815 07/08/15 0716 07/10/15 0727  WBC 15.0*  < > 11.5* 11.7* 8.5  NEUTROABS 12.4*  --  7.9*  --   --   HGB 7.7*  < > 7.5* 7.1* 8.5*  HCT 24.3*  < > 24.2* 23.5* 27.1*  MCV 95.3  < > 98.4 96.3 93.1  PLT 432*  < > 354 359 218  < > = values in this interval not displayed.  Scheduled Meds: . sodium chloride   Intravenous Once  . acyclovir  400 mg Oral QHS  . calcium acetate  1,334 mg Oral TID WC  . [START ON 07/13/2015] darbepoetin (ARANESP) injection - DIALYSIS  60 mcg Intravenous Q Mon-HD  . feeding supplement (NEPRO CARB STEADY)  237 mL Oral BID BM  . feeding supplement (PRO-STAT SUGAR FREE 64)  30 mL Oral BID  . heparin subcutaneous  5,000 Units Subcutaneous Q8H  . hydrALAZINE  25 mg Oral Q8H  . lenalidomide  10 mg Oral Q48H  . metoprolol tartrate  50 mg Oral BID  . mirtazapine  15 mg Oral QHS  . morphine  30 mg Oral Q12H  . multivitamin  1 tablet Oral QHS  . polyethylene glycol  17 g Oral Daily  . senna-docusate  2 tablet Oral BID   Continuous Infusions:  PRN Meds:.sodium chloride, acetaminophen, albuterol, diatrizoate meglumine-sodium, diphenhydrAMINE, diphenhydrAMINE, EPINEPHrine, EPINEPHrine, EPINEPHrine, EPINEPHrine, EPINEPHrine, famotidine, famotidine, heparin lock flush, heparin lock flush, heparin lock flush, heparin lock flush, HYDROcodone-acetaminophen, methylPREDNISolone sodium succinate, prochlorperazine, sodium chloride flush, sodium  chloride flush, sodium chloride flush, sodium chloride flush, sodium chloride flush, sodium chloride flush   Pearson Grippe  MD 07/10/2015, 8:06 AM

## 2015-07-11 ENCOUNTER — Other Ambulatory Visit: Payer: Self-pay | Admitting: Oncology

## 2015-07-11 DIAGNOSIS — G893 Neoplasm related pain (acute) (chronic): Principal | ICD-10-CM

## 2015-07-11 DIAGNOSIS — M899 Disorder of bone, unspecified: Secondary | ICD-10-CM

## 2015-07-11 DIAGNOSIS — C9000 Multiple myeloma not having achieved remission: Secondary | ICD-10-CM

## 2015-07-11 DIAGNOSIS — C7951 Secondary malignant neoplasm of bone: Secondary | ICD-10-CM

## 2015-07-11 LAB — URINE CULTURE

## 2015-07-11 MED ORDER — DEXAMETHASONE 4 MG PO TABS: ORAL_TABLET | ORAL | Status: DC

## 2015-07-11 MED ORDER — LENALIDOMIDE 10 MG PO CAPS: 10.0000 mg | ORAL_CAPSULE | ORAL | Status: DC

## 2015-07-12 LAB — CULTURE, BLOOD (ROUTINE X 2)
CULTURE: NO GROWTH
CULTURE: NO GROWTH

## 2015-07-13 ENCOUNTER — Encounter (HOSPITAL_COMMUNITY)
Admission: RE | Disposition: A | Discharge status: Home / Self Care | Payer: Self-pay | Source: Ambulatory Visit | Attending: Vascular Surgery

## 2015-07-13 ENCOUNTER — Ambulatory Visit (HOSPITAL_COMMUNITY): Payer: Self-pay | Admitting: Anesthesiology

## 2015-07-13 ENCOUNTER — Ambulatory Visit: Admission: RE | Admit: 2015-07-13 | Payer: Self-pay | Source: Ambulatory Visit

## 2015-07-13 ENCOUNTER — Ambulatory Visit: Payer: Self-pay

## 2015-07-13 ENCOUNTER — Ambulatory Visit (HOSPITAL_COMMUNITY)
Admission: RE | Admit: 2015-07-13 | Discharge: 2015-07-13 | Disposition: A | Discharge status: Home / Self Care | Payer: Self-pay | Source: Ambulatory Visit | Attending: Vascular Surgery | Admitting: Vascular Surgery

## 2015-07-13 ENCOUNTER — Encounter (HOSPITAL_COMMUNITY): Payer: Self-pay | Admitting: *Deleted

## 2015-07-13 DIAGNOSIS — C9000 Multiple myeloma not having achieved remission: Secondary | ICD-10-CM

## 2015-07-13 DIAGNOSIS — Z992 Dependence on renal dialysis: Secondary | ICD-10-CM | POA: Insufficient documentation

## 2015-07-13 DIAGNOSIS — N186 End stage renal disease: Secondary | ICD-10-CM | POA: Insufficient documentation

## 2015-07-13 DIAGNOSIS — Z79899 Other long term (current) drug therapy: Secondary | ICD-10-CM | POA: Insufficient documentation

## 2015-07-13 DIAGNOSIS — I120 Hypertensive chronic kidney disease with stage 5 chronic kidney disease or end stage renal disease: Secondary | ICD-10-CM | POA: Insufficient documentation

## 2015-07-13 DIAGNOSIS — N185 Chronic kidney disease, stage 5: Secondary | ICD-10-CM

## 2015-07-13 HISTORY — DX: Headache: R51

## 2015-07-13 HISTORY — PX: AV FISTULA PLACEMENT: SHX1204

## 2015-07-13 HISTORY — DX: Gastro-esophageal reflux disease without esophagitis: K21.9

## 2015-07-13 HISTORY — DX: Headache, unspecified: R51.9

## 2015-07-13 HISTORY — DX: Myoneural disorder, unspecified: G70.9

## 2015-07-13 HISTORY — DX: Essential (primary) hypertension: I10

## 2015-07-13 LAB — POCT I-STAT 4, (NA,K, GLUC, HGB,HCT)
Glucose, Bld: 98 mg/dL (ref 65–99)
HCT: 30 % — ABNORMAL LOW (ref 39.0–52.0)
HEMOGLOBIN: 10.2 g/dL — AB (ref 13.0–17.0)
POTASSIUM: 4.5 mmol/L (ref 3.5–5.1)
SODIUM: 133 mmol/L — AB (ref 135–145)

## 2015-07-13 LAB — GLUCOSE, CAPILLARY: GLUCOSE-CAPILLARY: 79 mg/dL (ref 65–99)

## 2015-07-13 SURGERY — ARTERIOVENOUS (AV) FISTULA CREATION
Anesthesia: Monitor Anesthesia Care | Site: Arm Upper | Laterality: Left

## 2015-07-13 MED ORDER — DEXTROSE 5 % IV SOLN
1.5000 g | INTRAVENOUS | Status: AC
  Administered 2015-07-13: 1.5 g via INTRAVENOUS
  Filled 2015-07-13: qty 1.5

## 2015-07-13 MED ORDER — OXYCODONE HCL 5 MG PO TABS
5.0000 mg | ORAL_TABLET | Freq: Once | ORAL | Status: AC | PRN
  Administered 2015-07-13: 5 mg via ORAL

## 2015-07-13 MED ORDER — ONDANSETRON HCL 4 MG/2ML IJ SOLN
INTRAMUSCULAR | Status: DC | PRN
  Administered 2015-07-13: 4 mg via INTRAVENOUS

## 2015-07-13 MED ORDER — PROTAMINE SULFATE 10 MG/ML IV SOLN
INTRAVENOUS | Status: AC
  Filled 2015-07-13: qty 25

## 2015-07-13 MED ORDER — ONDANSETRON HCL 4 MG/2ML IJ SOLN
INTRAMUSCULAR | Status: AC
  Filled 2015-07-13: qty 2

## 2015-07-13 MED ORDER — CHLORHEXIDINE GLUCONATE CLOTH 2 % EX PADS: 6.0000 | MEDICATED_PAD | Freq: Once | CUTANEOUS | Status: DC

## 2015-07-13 MED ORDER — OXYCODONE HCL 5 MG/5ML PO SOLN: 5.0000 mg | Freq: Once | ORAL | Status: AC | PRN

## 2015-07-13 MED ORDER — LIDOCAINE HCL (CARDIAC) 20 MG/ML IV SOLN
INTRAVENOUS | Status: DC | PRN
  Administered 2015-07-13: 60 mg via INTRATRACHEAL

## 2015-07-13 MED ORDER — HEPARIN SODIUM (PORCINE) 1000 UNIT/ML IJ SOLN
INTRAMUSCULAR | Status: AC
  Filled 2015-07-13: qty 1

## 2015-07-13 MED ORDER — LIDOCAINE HCL (PF) 1 % IJ SOLN
INTRAMUSCULAR | Status: AC
  Filled 2015-07-13: qty 30

## 2015-07-13 MED ORDER — PROTAMINE SULFATE 10 MG/ML IV SOLN
INTRAVENOUS | Status: DC | PRN
  Administered 2015-07-13 (×3): 10 mg via INTRAVENOUS

## 2015-07-13 MED ORDER — LIDOCAINE HCL (PF) 1 % IJ SOLN
INTRAMUSCULAR | Status: DC | PRN
Start: 2015-07-13 — End: 2015-07-13
  Administered 2015-07-13: 20 mL

## 2015-07-13 MED ORDER — PROPOFOL 10 MG/ML IV BOLUS
INTRAVENOUS | Status: AC
  Filled 2015-07-13: qty 20

## 2015-07-13 MED ORDER — ONDANSETRON HCL 4 MG/2ML IJ SOLN: 4.0000 mg | Freq: Four times a day (QID) | INTRAMUSCULAR | Status: DC | PRN

## 2015-07-13 MED ORDER — PROPOFOL 500 MG/50ML IV EMUL
INTRAVENOUS | Status: DC | PRN
  Administered 2015-07-13: 50 ug/kg/min via INTRAVENOUS

## 2015-07-13 MED ORDER — SODIUM CHLORIDE 0.9 % IV SOLN
INTRAVENOUS | Status: DC | PRN
  Administered 2015-07-13: 500 mL

## 2015-07-13 MED ORDER — LIDOCAINE 2% (20 MG/ML) 5 ML SYRINGE
INTRAMUSCULAR | Status: AC
  Filled 2015-07-13: qty 5

## 2015-07-13 MED ORDER — FENTANYL CITRATE (PF) 100 MCG/2ML IJ SOLN
INTRAMUSCULAR | Status: DC | PRN
  Administered 2015-07-13: 50 ug via INTRAVENOUS

## 2015-07-13 MED ORDER — HEPARIN SODIUM (PORCINE) 1000 UNIT/ML IJ SOLN
INTRAMUSCULAR | Status: DC | PRN
  Administered 2015-07-13: 5000 [IU] via INTRAVENOUS

## 2015-07-13 MED ORDER — MIDAZOLAM HCL 5 MG/5ML IJ SOLN
INTRAMUSCULAR | Status: DC | PRN
  Administered 2015-07-13: 2 mg via INTRAVENOUS

## 2015-07-13 MED ORDER — SODIUM CHLORIDE 0.9 % IV SOLN
INTRAVENOUS | Status: DC
  Administered 2015-07-13: 11:00:00 via INTRAVENOUS

## 2015-07-13 MED ORDER — FENTANYL CITRATE (PF) 100 MCG/2ML IJ SOLN: 25.0000 ug | INTRAMUSCULAR | Status: DC | PRN

## 2015-07-13 MED ORDER — MIDAZOLAM HCL 2 MG/2ML IJ SOLN
INTRAMUSCULAR | Status: AC
  Filled 2015-07-13: qty 2

## 2015-07-13 MED ORDER — THROMBIN 20000 UNITS EX SOLR
CUTANEOUS | Status: AC
  Filled 2015-07-13: qty 20000

## 2015-07-13 MED ORDER — 0.9 % SODIUM CHLORIDE (POUR BTL) OPTIME
TOPICAL | Status: DC | PRN
  Administered 2015-07-13: 1000 mL

## 2015-07-13 MED ORDER — SODIUM CHLORIDE 0.9 % IV SOLN: INTRAVENOUS | Status: DC

## 2015-07-13 MED ORDER — OXYCODONE HCL 5 MG PO TABS
ORAL_TABLET | ORAL | Status: AC
  Filled 2015-07-13: qty 1

## 2015-07-13 MED ORDER — FENTANYL CITRATE (PF) 250 MCG/5ML IJ SOLN
INTRAMUSCULAR | Status: AC
  Filled 2015-07-13: qty 5

## 2015-07-13 MED FILL — DEXAMETHASONE 4 MG TABLET: 4 | 28 days supply | Qty: 40 | Fill #0

## 2015-07-13 SURGICAL SUPPLY — 38 items
ARMBAND PINK RESTRICT EXTREMIT (MISCELLANEOUS) ×2 IMPLANT
CANISTER SUCTION 2500CC (MISCELLANEOUS) ×2 IMPLANT
CANNULA VESSEL 3MM 2 BLNT TIP (CANNULA) ×2 IMPLANT
CLIP TI MEDIUM 6 (CLIP) ×2 IMPLANT
CLIP TI WIDE RED SMALL 6 (CLIP) ×2 IMPLANT
COVER PROBE W GEL 5X96 (DRAPES) IMPLANT
DECANTER SPIKE VIAL GLASS SM (MISCELLANEOUS) ×2 IMPLANT
DRAIN PENROSE 1/4X12 LTX STRL (WOUND CARE) ×2 IMPLANT
ELECT REM PT RETURN 9FT ADLT (ELECTROSURGICAL) ×2
ELECTRODE REM PT RTRN 9FT ADLT (ELECTROSURGICAL) ×1 IMPLANT
GLOVE BIO SURGEON STRL SZ 6.5 (GLOVE) ×4 IMPLANT
GLOVE BIO SURGEON STRL SZ7.5 (GLOVE) ×2 IMPLANT
GLOVE BIOGEL PI IND STRL 6.5 (GLOVE) ×1 IMPLANT
GLOVE BIOGEL PI IND STRL 7.5 (GLOVE) ×1 IMPLANT
GLOVE BIOGEL PI IND STRL 8 (GLOVE) ×2 IMPLANT
GLOVE BIOGEL PI INDICATOR 6.5 (GLOVE) ×1
GLOVE BIOGEL PI INDICATOR 7.5 (GLOVE) ×1
GLOVE BIOGEL PI INDICATOR 8 (GLOVE) ×2
GLOVE ECLIPSE 7.0 STRL STRAW (GLOVE) ×2 IMPLANT
GLOVE ECLIPSE 7.5 STRL STRAW (GLOVE) ×6 IMPLANT
GLOVE SURG SS PI 7.5 STRL IVOR (GLOVE) ×2 IMPLANT
GOWN SPEC L4 XLG W/TWL (GOWN DISPOSABLE) ×6 IMPLANT
GOWN STRL REUS W/ TWL LRG LVL3 (GOWN DISPOSABLE) ×3 IMPLANT
GOWN STRL REUS W/TWL LRG LVL3 (GOWN DISPOSABLE) ×3
KIT BASIN OR (CUSTOM PROCEDURE TRAY) ×2 IMPLANT
KIT ROOM TURNOVER OR (KITS) ×2 IMPLANT
LIQUID BAND (GAUZE/BANDAGES/DRESSINGS) ×2 IMPLANT
LOOP VESSEL MINI RED (MISCELLANEOUS) IMPLANT
NS IRRIG 1000ML POUR BTL (IV SOLUTION) ×2 IMPLANT
PACK CV ACCESS (CUSTOM PROCEDURE TRAY) ×2 IMPLANT
PAD ARMBOARD 7.5X6 YLW CONV (MISCELLANEOUS) ×4 IMPLANT
SPONGE SURGIFOAM ABS GEL 100 (HEMOSTASIS) IMPLANT
SUT PROLENE 7 0 BV 1 (SUTURE) ×4 IMPLANT
SUT VIC AB 3-0 SH 27 (SUTURE) ×1
SUT VIC AB 3-0 SH 27X BRD (SUTURE) ×1 IMPLANT
SUT VICRYL 4-0 PS2 18IN ABS (SUTURE) ×4 IMPLANT
UNDERPAD 30X30 INCONTINENT (UNDERPADS AND DIAPERS) ×2 IMPLANT
WATER STERILE IRR 1000ML POUR (IV SOLUTION) ×2 IMPLANT

## 2015-07-13 NOTE — H&P (Signed)
[facility] [place] HISTORY AND PHYSICAL    History of Present Illness:  Patient is a 40 y.o. year old male who presents for placement of a permanent hemodialysis access. The patient is right handed.  The patient is currently on hemodialysis.  The cause of renal failure is thought to be secondary to multiple myeloma.  Other chronic medical problems include elevated cholesterol.  He does not speak much English so obtaining history fairly difficult.    Past Medical History   Diagnosis  Date   .  IgG myeloma ([place])     .  CKD (chronic kidney disease), stage III     .  Hypercholesterolemia     .  Lytic bone lesions on xray       History reviewed. No pertinent past surgical history.   Social History Social History   Substance Use Topics   .  Smoking status:  Never Smoker    .  Smokeless tobacco:  Never Used   .  Alcohol Use:  No     Family History Family History   Problem  Relation  Age of Onset   .  Diabetes  Father       Allergies    Allergies   Allergen  Reactions   .  Ciprofloxacin  Rash   .  Mobic [Meloxicam]  Rash        Current Facility-Administered Medications   Medication  Dose  Route  Frequency  Provider  Last Rate  Last Dose   .  0.9 %  sodium chloride infusion     Intravenous  Once PRN  [name], MD         .  0.9 %  sodium chloride infusion   100 mL  Intravenous  PRN  Roney Jaffe, MD         .  0.9 %  sodium chloride infusion   100 mL  Intravenous  PRN  Roney Jaffe, MD         .  0.9 %  sodium chloride infusion   100 mL  Intravenous  PRN  Roney Jaffe, MD         .  0.9 %  sodium chloride infusion   100 mL  Intravenous  PRN  Roney Jaffe, MD         .  acetaminophen (TYLENOL) tablet 650 mg   650 mg  Oral  Q6H PRN  Belkys A Regalado, MD     650 mg at 06/29/15 1502   .  acyclovir (ZOVIRAX) tablet 400 mg   400 mg  Oral  QHS  Geradine Girt, DO     400 mg at 07/04/15 0023   .  albuterol (PROVENTIL) (2.5 MG/3ML) 0.083%  nebulizer solution 2.5 mg   2.5 mg  Nebulization  Once PRN  [name], MD         .  albuterol (PROVENTIL) (2.5 MG/3ML) 0.083% nebulizer solution 2.5 mg   2.5 mg  Nebulization  Once PRN  [name], MD         .  alteplase (CATHFLO ACTIVASE) injection 2 mg   2 mg  Intracatheter  Once PRN  Roney Jaffe, MD         .  alteplase (CATHFLO ACTIVASE) injection 2 mg   2 mg  Intracatheter  Once PRN  Roney Jaffe, MD         .  dexamethasone (DECADRON) tablet 20 mg  20 mg  Oral  Daily  Jani Gravel, MD         .  diatrizoate meglumine-sodium (GASTROGRAFIN) 66-10 % solution 15 mL   15 mL  Oral  PRN  Belkys A Regalado, MD         .  diphenhydrAMINE (BENADRYL) injection 25 mg   25 mg  Intravenous  Once PRN  [name], MD         .  diphenhydrAMINE (BENADRYL) injection 25 mg   25 mg  Intravenous  Once PRN  [name], MD         .  diphenhydrAMINE (BENADRYL) injection 50 mg   50 mg  Intravenous  Once PRN  [name], MD         .  diphenhydrAMINE (BENADRYL) injection 50 mg   50 mg  Intravenous  Once PRN  [name], MD         .  EPINEPHrine (ADRENALIN) 0.1 MG/ML injection 0.25 mg   0.25 mg  Intravenous  Once PRN  [name], MD         .  EPINEPHrine (ADRENALIN) 0.1 MG/ML injection 0.25 mg   0.25 mg  Intravenous  Once PRN  [name], MD         .  EPINEPHrine (ADRENALIN) 0.1 MG/ML injection 0.25 mg   0.25 mg  Intravenous  Once PRN  [name], MD         .  EPINEPHrine (ADRENALIN) 0.1 MG/ML injection 0.25 mg   0.25 mg  Intravenous  Once PRN  [name], MD         .  EPINEPHrine (ADRENALIN) injection 0.5 mg   0.5 mg  Subcutaneous  Once PRN  [name], MD         .  EPINEPHrine (ADRENALIN) injection 0.5 mg   0.5 mg  Subcutaneous  Once PRN  [name], MD         .  EPINEPHrine (ADRENALIN) injection 0.5 mg   0.5 mg  Subcutaneous  Once PRN  [name], MD         .  EPINEPHrine (ADRENALIN) injection 0.5 mg   0.5 mg   Subcutaneous  Once PRN  [name], MD         .  famotidine (PEPCID) IVPB 20 mg premix   20 mg  Intravenous  Once PRN  [name], MD         .  feeding supplement (ENSURE ENLIVE) (ENSURE ENLIVE) liquid 237 mL   237 mL  Oral  TID BM  Clayton Bibles, RD     237 mL at 07/04/15 1000   .  heparin injection 1,000 Units   1,000 Units  Dialysis  PRN  Roney Jaffe, MD         .  heparin injection 1,000 Units   1,000 Units  Dialysis  PRN  Roney Jaffe, MD         .  heparin injection 2,000 Units   2,000 Units  Dialysis  PRN  Roney Jaffe, MD         .  Derrill Memo ON 07/05/2015] heparin injection 3,000 Units   3,000 Units  Dialysis  PRN  Roney Jaffe, MD         .  heparin lock flush 100 unit/mL   500 Units  Intracatheter  Daily PRN  [name], MD         .  heparin lock flush 100 unit/mL   250 Units  Intracatheter  PRN  [name], MD         .  heparin lock flush 100 unit/mL   500 Units  Intracatheter  Once PRN  [name], MD         .  heparin lock flush 100 unit/mL   250 Units  Intracatheter  Once PRN  [name], MD         .  heparin lock flush 100 unit/mL   500 Units  Intracatheter  Once PRN  [name], MD         .  heparin lock flush 100 unit/mL   250 Units  Intracatheter  Once PRN  [name], MD         .  hydrALAZINE (APRESOLINE) tablet 25 mg   25 mg  Oral  Q8H  Roney Jaffe, MD     25 mg at 07/04/15 9604   .  HYDROcodone-acetaminophen (NORCO) 10-325 MG per tablet 1-2 tablet   1-2 tablet  Oral  Q4H PRN  [name], MD     1 tablet at 07/01/15 1419   .  lenalidomide (REVLIMID) capsule 10 mg   10 mg  Oral  Q48H  [name], MD     10 mg at 07/03/15 1715   .  lidocaine (PF) (XYLOCAINE) 1 % injection 5 mL   5 mL  Intradermal  PRN  Roney Jaffe, MD         .  lidocaine (PF) (XYLOCAINE) 1 % injection 5 mL   5 mL  Intradermal  PRN  Roney Jaffe, MD         .  lidocaine-prilocaine (EMLA) cream 1 application   1 application   Topical  PRN  Roney Jaffe, MD         .  lidocaine-prilocaine (EMLA) cream 1 application   1 application  Topical  PRN  Roney Jaffe, MD         .  methylPREDNISolone sodium succinate (SOLU-MEDROL) 125 mg/2 mL injection 125 mg   125 mg  Intravenous  Once PRN  [name], MD         .  methylPREDNISolone sodium succinate (SOLU-MEDROL) 125 mg/2 mL injection 125 mg   125 mg  Intravenous  Once PRN  [name], MD         .  metoprolol (LOPRESSOR) tablet 50 mg   50 mg  Oral  BID  Roney Jaffe, MD     50 mg at 07/04/15 1021   .  mirtazapine (REMERON) tablet 15 mg   15 mg  Oral  QHS  [name], MD     15 mg at 07/02/15 2234   .  morphine (MS CONTIN) 12 hr tablet 30 mg   30 mg  Oral  Q12H  [name], MD     30 mg at 07/04/15 1021   .  pentafluoroprop-tetrafluoroeth (GEBAUERS) aerosol 1 application   1 application  Topical  PRN  Roney Jaffe, MD         .  pentafluoroprop-tetrafluoroeth (GEBAUERS) aerosol 1 application   1 application  Topical  PRN  Roney Jaffe, MD         .  polyethylene glycol (MIRALAX / GLYCOLAX) packet 17 g   17 g  Oral  Daily  [name], MD     17 g at  07/04/15 1021   .  prochlorperazine (COMPAZINE) injection 10 mg   10 mg  Intravenous  TID PRN  Belkys A Regalado, MD     10 mg at 06/30/15 1125   .  senna-docusate (Senokot-S) tablet 2 tablet   2 tablet  Oral  BID  Florencia Reasons, MD     2 tablet at 07/04/15 1021   .  sodium chloride flush (NS) 0.9 % injection 10 mL   10 mL  Intracatheter  PRN  [name], MD         .  sodium chloride flush (NS) 0.9 % injection 10 mL   10 mL  Intracatheter  PRN  [name], MD         .  sodium chloride flush (NS) 0.9 % injection 3 mL   3 mL  Intravenous  PRN  [name], MD     3 mL at 07/02/15 2235   .  sodium chloride flush (NS) 0.9 % injection 3 mL   3 mL  Intravenous  PRN  [name], MD           ROS:    Unable to obtain due to language barrier  Physical Examination     Filed Vitals:     07/04/15 1330  07/04/15 1400  07/04/15 1430  07/04/15 1500   BP:  125/68  118/65  120/74  121/74   Pulse:  89  88  89  89   Temp:           TempSrc:           Resp:  10  11       Height:           Weight:           SpO2:  96%  95%         Body mass index is 29.97 kg/(m^2).  General:  Alert and oriented, no acute distress HEENT: Normal Neck: No bruit or JVD, right side dialysis catheter Pulmonary: Clear to auscultation bilaterally Cardiac: Regular Rate and Rhythm   Gastrointestinal: Soft, non-tender, non-distended, no mass Skin: No rash Extremity Pulses:  2+ radial, brachial pulses bilaterally, IVs left wrist, right forearm Musculoskeletal: No deformity or edema     Neurologic: Upper and lower extremity motor 5/5 and symmetric  DATA:   CBC  Labs (Brief)        Component  Value  Date/Time     WBC  6.8  07/04/2015 1319     WBC  19.8*  06/23/2015 1215     RBC  2.48*  07/04/2015 1319     RBC  3.11*  06/23/2015 1215     HGB  7.3*  07/04/2015 1319     HGB  9.6*  06/23/2015 1215     HCT  23.4*  07/04/2015 1319     HCT  29.0*  06/23/2015 1215     PLT  339  07/04/2015 1319     PLT  785*  06/23/2015 1215     MCV  94.4  07/04/2015 1319     MCV  93.3  06/23/2015 1215     MCH  29.4  07/04/2015 1319     MCH  30.8  06/23/2015 1215     MCHC  31.2  07/04/2015 1319     MCHC  33.1  06/23/2015 1215     RDW  15.0  07/04/2015 1319     RDW  14.5  06/23/2015 1215     LYMPHSABS  1.3  07/03/2015 0859     LYMPHSABS  1.7  06/23/2015 1215     MONOABS  1.4*  07/03/2015 0859     MONOABS  1.3*  06/23/2015 1215     EOSABS  0.0  07/03/2015 0859     EOSABS  0.0  06/23/2015 1215     BASOSABS  0.0  07/03/2015 0859     BASOSABS  0.0  06/23/2015 1215       BMET  Labs (Brief)        Component  Value  Date/Time     NA  134*  07/04/2015 1321     NA  134*  06/23/2015 1215     K  4.3  07/04/2015 1321     K  4.2  06/23/2015 1215     CL  101  07/04/2015 1321     CO2  22   07/04/2015 1321     CO2  20*  06/23/2015 1215     GLUCOSE  93  07/04/2015 1321     GLUCOSE  139  06/23/2015 1215     BUN  85*  07/04/2015 1321     BUN  63.9*  06/23/2015 1215     CREATININE  5.81*  07/04/2015 1321     CREATININE  4.3 Repeated and Verified*  06/23/2015 1215     CALCIUM  7.6*  07/04/2015 1321     CALCIUM  13.4 Repeated and Verified*  06/23/2015 1215     GFRNONAA  11*  07/04/2015 1321     GFRAA  13*  07/04/2015 1321       ASSESSMENT: Needs hemodialysis access   PLAN:1.  Vein mapping ordered 2. When vein map complete will review and discuss options with pt with interpreter present  Ruta Hinds, MD [facility] [place] Office: [phone] Pager: [phone]   Left arm AV fistula today Plan risk benefit procedure discussed  Ruta Hinds, MD [facility] [place]: [phone] Pager: [phone]

## 2015-07-13 NOTE — Anesthesia Procedure Notes (Addendum)
Procedure Name: MAC Date/Time: 07/13/2015 11:30 AM Performed by: Scheryl Darter Pre-anesthesia Checklist: Patient identified, Emergency Drugs available, Suction available and Patient being monitored Oxygen Delivery Method: Simple face mask Intubation Type: IV induction Placement Confirmation: positive ETCO2

## 2015-07-13 NOTE — Anesthesia Preprocedure Evaluation (Signed)
Anesthesia Evaluation  Patient identified by MRN, date of birth, ID band Patient awake    Reviewed: Allergy & Precautions, NPO status , Patient's Chart, lab work & pertinent test results  Airway Mallampati: II   Neck ROM: full    Dental   Pulmonary neg pulmonary ROS,    breath sounds clear to auscultation       Cardiovascular hypertension,  Rhythm:regular Rate:Normal     Neuro/Psych    GI/Hepatic   Endo/Other    Renal/GU ESRFRenal disease     Musculoskeletal   Abdominal   Peds  Hematology  (+) Blood dyscrasia, anemia , IgG myeloma   Anesthesia Other Findings   Reproductive/Obstetrics                             Anesthesia Physical Anesthesia Plan  ASA: III  Anesthesia Plan: MAC   Post-op Pain Management:    Induction: Intravenous  Airway Management Planned: Simple Face Mask  Additional Equipment:   Intra-op Plan:   Post-operative Plan:   Informed Consent: I have reviewed the patients History and Physical, chart, labs and discussed the procedure including the risks, benefits and alternatives for the proposed anesthesia with the patient or authorized representative who has indicated his/her understanding and acceptance.     Plan Discussed with: CRNA, Anesthesiologist and Surgeon  Anesthesia Plan Comments:         Anesthesia Quick Evaluation

## 2015-07-13 NOTE — Transfer of Care (Signed)
Immediate Anesthesia Transfer of Care Note  Patient: Charles [name]  Procedure(s) Performed: Procedure(s): CREATION OF LEFT UPPER ARM  ARTERIOVENOUS (AV) FISTULA  (Left)  Patient Location: PACU  Anesthesia Type:MAC  Level of Consciousness: awake, oriented and sedated  Airway & Oxygen Therapy: Patient Spontanous Breathing and Patient connected to nasal cannula oxygen  Post-op Assessment: Report given to RN, Post -op Vital signs reviewed and stable and Patient moving all extremities  Post vital signs: Reviewed and stable  Last Vitals:  Filed Vitals:   07/13/15 1055  BP: 204/95  Pulse: 88  Temp: 37 C  Resp: 18    Last Pain: There were no vitals filed for this visit.    Patients Stated Pain Goal: 0 (12/45/80 9983)  Complications: No apparent anesthesia complications

## 2015-07-13 NOTE — Op Note (Signed)
Procedure: Exploration left wrist cephalic vein, Left Brachial Cephalic AV fistula  Preop: ESRD  Postop: ESRD  Anesthesia: Local with IV sedation  Assistant:  Gerri Lins PA-C  Findings: 3 mm cephalic vein  Procedure: After obtaining informed consent, the patient was taken to the operating room.  After adequate sedation, the left upper extremity was prepped and draped in usual sterile fashion.  Local anesthesia was infiltrated midway between the cephalic vein and radial artery at the wrist.  A longitudinal incision was made in this location and carried through the subcutaneous tissues to the cephalic vein.  This was noted to be atrophied with fresh thrombus within it.  I decided this was unusable.  The subcutaneous tissues were reapproximated with a running 3 0 vicryl followed by a 4 0 vicryl subcuticular stitch.  Next local anesthesia was infiltrated near the antecubital crease. A transverse incision was then made near the antecubital crease. The incision was carried into the subcutaneous tissues down to level of the cephalic vein. The cephalic vein was approximately 3 mm in diameter. It was of good quality. This was dissected free circumferentially and small side branches ligated and divided between silk ties or clips. Next the brachial artery was dissected free in the medial portion of the incision. The artery was  3-4 mm in diameter. The vessel loops were placed proximal and distal to the planned site of arteriotomy. The patient was given 5000 units of intravenous heparin. After appropriate circulation time, the vessel loops were used to control the artery. A longitudinal opening was made in the brachial artery.  The vein was ligated distally with a 2-0 silk tie. The vein was controlled proximally with a fine bulldog clamp. The vein was then swung over to the artery and sewn end of vein to side of artery using a running 7-0 Prolene suture. Just prior to completion of the anastomosis, everything  was fore bled back bled and thoroughly flushed. The anastomosis was secured, vessel loops released, and there was a palpable thrill in the fistula immediately. 50 mg of protamine was given.  After hemostasis was obtained, the subcutaneous tissues were reapproximated using a running 3-0 Vicryl suture. The skin was then closed with a 4 0 Vicryl subcuticular stitch. Dermabond was applied to the skin incision.  The patient had a palpable radial pulse at the end of the case.  Ruta Hinds, MD [facility] [place] Office: [phone] Pager: [phone]

## 2015-07-14 ENCOUNTER — Ambulatory Visit
Admission: RE | Admit: 2015-07-14 | Discharge: 2015-07-14 | Disposition: A | Discharge status: Home / Self Care | Payer: Self-pay | Source: Ambulatory Visit | Attending: Radiation Oncology | Admitting: Radiation Oncology

## 2015-07-14 ENCOUNTER — Encounter (HOSPITAL_COMMUNITY): Payer: Self-pay | Admitting: Vascular Surgery

## 2015-07-14 ENCOUNTER — Telehealth: Payer: Self-pay

## 2015-07-14 ENCOUNTER — Ambulatory Visit: Payer: Self-pay

## 2015-07-14 ENCOUNTER — Telehealth: Payer: Self-pay | Admitting: Vascular Surgery

## 2015-07-14 NOTE — Telephone Encounter (Signed)
Sched appt 7/27 at 1:45. Spoke to pt's wife to inform them of appt.

## 2015-07-14 NOTE — Telephone Encounter (Signed)
I received a phone call from [place] regarding whether or not [name] would be coming for his radiation treatment today. I called and spoke to Mr. Lenore Manner wife Wandra Scot. When she was informed of his radiation appointment today at 1:25, she stated she would be able to bring him, but not until 1:40. She stated she knew where the [facility] was and didn't need any directions. I then called [facility] and spoke with Radiation Therapist Melissa to inform her that [name] would be coming today at 1:40 for treatment. She voiced her understanding and will be expecting him at 1:40.

## 2015-07-14 NOTE — Telephone Encounter (Signed)
-----   Message from Mena Goes, RN sent at 07/13/2015  1:09 PM EDT ----- Regarding: schedule   ----- Message -----    From: Ulyses Amor, PA-C    Sent: 07/13/2015  12:32 PM      To: [facility] Charge Pool  F/U with Dr. Oneida Alar in 4 weeks s/p av fistula creation

## 2015-07-15 ENCOUNTER — Ambulatory Visit (HOSPITAL_BASED_OUTPATIENT_CLINIC_OR_DEPARTMENT_OTHER): Payer: Self-pay

## 2015-07-15 ENCOUNTER — Ambulatory Visit: Payer: Self-pay

## 2015-07-15 ENCOUNTER — Other Ambulatory Visit (HOSPITAL_BASED_OUTPATIENT_CLINIC_OR_DEPARTMENT_OTHER): Payer: Self-pay

## 2015-07-15 ENCOUNTER — Encounter: Payer: Self-pay | Admitting: Nurse Practitioner

## 2015-07-15 ENCOUNTER — Ambulatory Visit (HOSPITAL_BASED_OUTPATIENT_CLINIC_OR_DEPARTMENT_OTHER): Payer: Self-pay | Admitting: Nurse Practitioner

## 2015-07-15 ENCOUNTER — Ambulatory Visit
Admission: RE | Admit: 2015-07-15 | Discharge: 2015-07-15 | Disposition: A | Discharge status: Home / Self Care | Payer: Self-pay | Source: Ambulatory Visit | Attending: Radiation Oncology | Admitting: Radiation Oncology

## 2015-07-15 ENCOUNTER — Other Ambulatory Visit: Payer: Self-pay

## 2015-07-15 VITALS — BP 116/71 | HR 78 | Temp 98.2°F | Resp 18 | Ht 61.0 in | Wt 136.0 lb

## 2015-07-15 DIAGNOSIS — C9000 Multiple myeloma not having achieved remission: Secondary | ICD-10-CM

## 2015-07-15 DIAGNOSIS — C9002 Multiple myeloma in relapse: Secondary | ICD-10-CM

## 2015-07-15 DIAGNOSIS — Z5112 Encounter for antineoplastic immunotherapy: Secondary | ICD-10-CM

## 2015-07-15 DIAGNOSIS — Z992 Dependence on renal dialysis: Secondary | ICD-10-CM

## 2015-07-15 DIAGNOSIS — N183 Chronic kidney disease, stage 3 (moderate): Secondary | ICD-10-CM

## 2015-07-15 DIAGNOSIS — N179 Acute kidney failure, unspecified: Secondary | ICD-10-CM

## 2015-07-15 LAB — CBC WITH DIFFERENTIAL/PLATELET
BASO%: 0 % (ref 0.0–2.0)
BASOS ABS: 0 10*3/uL (ref 0.0–0.1)
EOS%: 0.3 % (ref 0.0–7.0)
Eosinophils Absolute: 0 10*3/uL (ref 0.0–0.5)
HCT: 27.7 % — ABNORMAL LOW (ref 38.4–49.9)
HGB: 8.9 g/dL — ABNORMAL LOW (ref 13.0–17.1)
LYMPH%: 15.2 % (ref 14.0–49.0)
MCH: 30.6 pg (ref 27.2–33.4)
MCHC: 32.1 g/dL (ref 32.0–36.0)
MCV: 95.2 fL (ref 79.3–98.0)
MONO#: 1 10*3/uL — ABNORMAL HIGH (ref 0.1–0.9)
MONO%: 15.8 % — AB (ref 0.0–14.0)
NEUT#: 4.3 10*3/uL (ref 1.5–6.5)
NEUT%: 68.7 % (ref 39.0–75.0)
Platelets: 217 10*3/uL (ref 140–400)
RBC: 2.91 10*6/uL — ABNORMAL LOW (ref 4.20–5.82)
RDW: 15.1 % — ABNORMAL HIGH (ref 11.0–14.6)
WBC: 6.2 10*3/uL (ref 4.0–10.3)
lymph#: 0.9 10*3/uL (ref 0.9–3.3)

## 2015-07-15 LAB — COMPREHENSIVE METABOLIC PANEL
ALBUMIN: 1.4 g/dL — AB (ref 3.5–5.0)
ALT: 11 U/L (ref 0–55)
ANION GAP: 14 meq/L — AB (ref 3–11)
AST: 13 U/L (ref 5–34)
Alkaline Phosphatase: 168 U/L — ABNORMAL HIGH (ref 40–150)
BILIRUBIN TOTAL: 1.78 mg/dL — AB (ref 0.20–1.20)
BUN: 53.1 mg/dL — ABNORMAL HIGH (ref 7.0–26.0)
CO2: 29 meq/L (ref 22–29)
CREATININE: 6.3 mg/dL — AB (ref 0.7–1.3)
Calcium: 7.8 mg/dL — ABNORMAL LOW (ref 8.4–10.4)
Chloride: 96 mEq/L — ABNORMAL LOW (ref 98–109)
EGFR: 10 mL/min/{1.73_m2} — AB (ref >90)
Glucose: 104 mg/dl (ref 70–140)
POTASSIUM: 3.8 meq/L (ref 3.5–5.1)
Sodium: 139 mEq/L (ref 136–145)
TOTAL PROTEIN: 5.3 g/dL — AB (ref 6.4–8.3)

## 2015-07-15 MED ORDER — SODIUM CHLORIDE 0.9 % IV SOLN
10.0000 mg | Freq: Once | INTRAVENOUS | Status: AC
  Administered 2015-07-15: 10 mg via INTRAVENOUS
  Filled 2015-07-15: qty 1

## 2015-07-15 MED ORDER — CARFILZOMIB CHEMO INJECTION 60 MG
27.0000 mg/m2 | Freq: Once | INTRAVENOUS | Status: AC
  Administered 2015-07-15: 46 mg via INTRAVENOUS
  Filled 2015-07-15: qty 23

## 2015-07-15 MED ORDER — SODIUM CHLORIDE 0.9 % IV SOLN: 20.0000 mg | Freq: Once | INTRAVENOUS | Status: DC

## 2015-07-15 MED ORDER — PROCHLORPERAZINE MALEATE 10 MG PO TABS
ORAL_TABLET | ORAL | Status: AC
  Filled 2015-07-15: qty 1

## 2015-07-15 MED ORDER — SODIUM CHLORIDE 0.9 % IV SOLN: Freq: Once | INTRAVENOUS | Status: DC

## 2015-07-15 MED ORDER — PROCHLORPERAZINE MALEATE 10 MG PO TABS
10.0000 mg | ORAL_TABLET | Freq: Once | ORAL | Status: AC
  Administered 2015-07-15: 10 mg via ORAL

## 2015-07-15 NOTE — Progress Notes (Signed)
Here his progress   Charles [name]  [phone] [phone]    ID: Charles [name] DOB: November 10, 1975  MR#: [id number]  HDQ#:[id number]  Patient Care Team: Tresa Garter, MD as PCP - General (Internal Medicine) [name], NP as Nurse Practitioner (Hematology and Oncology) [name], MD as Consulting Physician (Oncology) PCP: [name], MD GYN: SU:  OTHER MD:  CHIEF COMPLAINT: multiple myeloma  CURRENT TREATMENT:   Denosumab/Xgeva; carfilzomib, cyclophosphamide, dexamethasone  HISTORY  OF MULTIPLE MYELOMA: From the original intake note January 2016:  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 The pain was worse with coughing. Exam was unremarkable, and he was treated with Mobic. No labs were obtained. On 01/30/2014 he presented to the emergency room at [facility] with similar complaints and also reported a 10 pound weight loss over the past month. A chest x-ray was significant only for mild atelectasis, but ACT NGO of the chest 01/30/2014, while it showed no pulmonary emboli, showed an osteolytic lesion destroying the manubrium of the sternum. There was extra osseous extension into the anterior mediastinum.  The patient was admitted and found to have a creatinine of 1.64 with a GFR of 51. Bilateral renal ultrasound showed no hydronephrosis or masses, and normal size and cortical thickness of both kidneys, though there was mild diffuse echogenicity. Urine total protein was 145 mg/dL and immunofixation showed a monoclonal IgG kappa protein as well as monoclonal free kappa light chains. On 01/31/2014 serum protein electrophoresis showed an M spike of 2.22 g/dL with a total protein of 8.3 and albumin 3.4. Kappa lambda light chains from the serum obtained 02/04/2014 showed 10.50 mg/dL on the, 1.74  in the lambda, with an elevated ratio at 6.03. Beta-2 microglobulin was 7.36. LDH was normal at 184 and HIV antibody was nonreactive.  Biopsy of the manubrial mass 02/03/2014 showed extensive infiltration of the bone by sheets of atypical plasma cells, positive for CD138, kappa restricted (SZA 16-23). Right iliac bone marrow biopsy Charles [name] 05/21/2014 (FZB 16-1) showed an average of 12% plasma cells with numerous aggregates and sheets of atypical plasma cells, which were kappa restricted. Cytogenetic analysis is pending  The patient's subsequent history is as detailed below  INTERVAL HISTORY: Charles [name] returns today for follow-up of his multiple myeloma, accompanied by 2 family members. Today is day 15, cycle 2 of carfilzomib. He is also on lenalidomide every other day and high dose dexamethasone on Saturdays and Sunday.   REVIEW OF SYSTEMS: Charles [name] is seated in a wheelchair today. He is fatigued. His right leg aches. He uses tramadol for pain PRN. He is on hemodialysis on Tuesdays, Thursdays, and Saturdays. His appetite is down. He is losing weight. He doesn't sleep well while on steroids. He denies shortness of breath, chest pain, cough, or palpitations. He has no headaches, dizziness, or vision changes. A detailed review of systems is otherwise stable.   PAST MEDICAL HISTORY: Past Medical History  Diagnosis Date  . IgG myeloma ([place])   . Hypercholesterolemia   . Lytic bone lesions on xray   . CKD (chronic kidney disease), stage III     [address] [facility]  . Hypertension   . GERD (gastroesophageal reflux disease)   . Headache     occasional  . Neuromuscular disorder ([place])     states both arms are shaking  a lot    PAST SURGICAL HISTORY: Past Surgical History  Procedure Laterality Date  . Insertion of dialysis catheter Right   . Av fistula placement Left 07/13/2015    Procedure: CREATION OF LEFT UPPER ARM  ARTERIOVENOUS (AV) FISTULA ;  Surgeon: Elam Dutch, MD;  Location: [facility] OR;   Service: Vascular;  Laterality: Left;    FAMILY HISTORY Family History  Problem Relation Age of Onset  . Diabetes Father    The patient's parents are still living, in their late 28's. The patient has 2 brothers, 3 sisters. There is noc cancer in the fmaily to his knowledge.  SOCIAL HISTORY:  Originally from [facility], moved here 19 years ago. He works in Nurse, adult. Married (wife's name is [name]), lives in [place]. He has 5 children ages 88, 11, 62, 74 and 4 months [name], [name], [name] and Henderson) in good health. Best number to call him is [phone]    ADVANCED DIRECTIVES: not  In place   HEALTH MAINTENANCE: Social History  Substance Use Topics  . Smoking status: Never Smoker   . Smokeless tobacco: Never Used  . Alcohol Use: No     Colonoscopy:  PSA:  Bone density:  Lipid panel:  Allergies  Allergen Reactions  . Ciprofloxacin Rash  . Mobic [Meloxicam] Rash    Current Outpatient Prescriptions  Medication Sig Dispense Refill  . acetaminophen (TYLENOL) 500 MG tablet Take 500 mg by mouth every 6 (six) hours as needed for mild pain, moderate pain, fever or headache.    Marland Kitchen acyclovir (ZOVIRAX) 200 MG capsule Take 2 capsules (400 mg total) by mouth daily. (Patient not taking: Reported on 07/13/2015) 60 capsule 6  . Amino Acids-Protein Hydrolys (FEEDING SUPPLEMENT, PRO-STAT SUGAR FREE 64,) LIQD Take 30 mLs by mouth 2 (two) times daily. (Patient not taking: Reported on 07/13/2015)    . calcium acetate (PHOSLO) 667 MG capsule Take 2 capsules (1,334 mg total) by mouth 3 (three) times daily with meals. (Patient not taking: Reported on 07/13/2015) 120 capsule 0  . Cholecalciferol (VITAMIN D PO) Take 500 Units by mouth daily.    Marland Kitchen dexamethasone (DECADRON) 4 MG tablet Take 5 tablets (18m) on Saturday and Sunday every week ([place]. (Patient not taking: Reported on 07/13/2015) 60 tablet 4  .  hydrALAZINE (APRESOLINE) 25 MG tablet Take 1 tablet (25 mg total) by mouth 3 (three) times daily. 90 tablet 0  . lenalidomide (REVLIMID) 10 MG capsule Take 1 capsule (10 mg total) by mouth every other day. Or as per Oncology recommendations. (Patient not taking: Reported on 07/13/2015) 45 capsule 3  . metoprolol (LOPRESSOR) 50 MG tablet Take 1 tablet (50 mg total) by mouth 2 (two) times daily. 60 tablet 0  . mirtazapine (REMERON) 15 MG tablet Take 1 tablet (15 mg total) by mouth at bedtime. (Patient not taking: Reported on 07/13/2015) 30 tablet 0  . morphine (MS CONTIN) 30 MG 12 hr tablet Take 1 tablet (30 mg total) by mouth every 12 (twelve) hours. (Patient not taking: Reported on 07/13/2015) 30 tablet 0  . multivitamin (RENA-VIT) TABS tablet Take 1 tablet by mouth at bedtime. (Patient not taking: Reported on 07/13/2015) 30 tablet 0  . Nutritional Supplements (FEEDING SUPPLEMENT, NEPRO CARB STEADY,) LIQD Take 237 mLs by mouth 2 (two) times daily between meals. (Patient not taking: Reported on 07/13/2015)    . polyethylene glycol (MIRALAX / GLYCOLAX) packet Take 17 g by mouth  daily. (Patient not taking: Reported on 07/13/2015) 14 each 0  . prochlorperazine (COMPAZINE) 10 MG tablet Take 1 tablet (10 mg total) by mouth every 6 (six) hours as needed for nausea or vomiting. (Patient not taking: Reported on 07/13/2015) 60 tablet 6  . senna-docusate (SENOKOT-S) 8.6-50 MG tablet Take 2 tablets by mouth 2 (two) times daily. (Patient not taking: Reported on 07/13/2015) 120 tablet 0  . traMADol (ULTRAM) 50 MG tablet Take 1 tablet (50 mg total) by mouth every 6 (six) hours as needed. (Patient taking differently: Take 50 mg by mouth every 6 (six) hours as needed for moderate pain. ) 60 tablet 5   No current facility-administered medications for this visit.    OBJECTIVE: Young Latin man In no acute distress Filed Vitals:   07/15/15 1154  BP: 116/71  Pulse: 78  Temp: 98.2 F (36.8 C)  Resp: 18     Body mass index  is 25.71 kg/(m^2).    ECOG FS:2 - Symptomatic, <50% confined to bed   Skin: warm, dry  HEENT: sclerae anicteric, conjunctivae pink, oropharynx clear. No thrush or mucositis.  Lymph Nodes: No cervical or supraclavicular lymphadenopathy  Lungs: clear to auscultation bilaterally, no rales, wheezes, or rhonci  Heart: regular rate and rhythm  Abdomen: round, soft, non tender, positive bowel sounds  Musculoskeletal: No focal spinal tenderness, no peripheral edema  Neuro: non focal, well oriented, positive affect    Results for Charles, [name] (MRN [id number]) as of 07/15/2015 15:06  Ref. Range 02/12/2015 14:33 03/12/2015 12:53 04/09/2015 15:34 05/14/2015 13:25 06/11/2015 10:10  M Protein SerPl Elph-Mcnc Latest Ref Range: Not Observed g/dL 0.5 (H) 1.1 (H) 1.2 (H) 1.8 (H) 1.7 (H)   Results for Charles, [name] (MRN [id number]) as of 07/15/2015 15:06  Ref. Range 11/14/2014 15:27 12/12/2014 15:06 01/01/2015 12:03 01/20/2015 09:52 06/27/2015 03:28  Kappa free light chain Latest Ref Range: 3.30-19.40 mg/L 4.19 (H) 5.15 (H) 10.50 (H) 12.40 (H) 145.75 (H)    Results for Charles, [name] (MRN [id number]) as of 07/15/2015 15:06  Ref. Range 07/06/2015 13:45 07/07/2015 18:15 07/08/2015 07:16 07/10/2015 07:27 07/15/2015 11:05  Creatinine Latest Ref Range: 0.7-1.3 mg/dL 6.87 (H) 5.60 (H) 6.51 (H) 7.26 (H) 6.3 (HH)     CMP     Component Value Date/Time   NA 139 07/15/2015 1105   NA 133* 07/13/2015 1041   K 3.8 07/15/2015 1105   K 4.5 07/13/2015 1041   CL 98* 07/10/2015 0727   CO2 29 07/15/2015 1105   CO2 25 07/10/2015 0727   GLUCOSE 104 07/15/2015 1105   GLUCOSE 98 07/13/2015 1041   BUN 53.1* 07/15/2015 1105   BUN 84* 07/10/2015 0727   CREATININE 6.3* 07/15/2015 1105   CREATININE 7.26* 07/10/2015 0727   CALCIUM 7.8* 07/15/2015 1105   CALCIUM 7.1* 07/10/2015 0727   PROT 5.3* 07/15/2015 1105   PROT 5.0* 07/07/2015 1815   PROT 6.6 06/11/2015 1010   ALBUMIN 1.4* 07/15/2015 1105   ALBUMIN 1.2* 07/10/2015  0727   AST 13 07/15/2015 1105   AST 16 07/07/2015 1815   ALT 11 07/15/2015 1105   ALT 14* 07/07/2015 1815   ALKPHOS 168* 07/15/2015 1105   ALKPHOS 203* 07/07/2015 1815   BILITOT 1.78* 07/15/2015 1105   BILITOT 1.2 07/07/2015 1815   GFRNONAA 8* 07/10/2015 0727   GFRAA 10* 07/10/2015 0727    INo results found for: SPEP, UPEP  Lab Results  Component Value Date   WBC 6.2 07/15/2015   NEUTROABS 4.3 07/15/2015   HGB 8.9*  07/15/2015   HCT 27.7* 07/15/2015   MCV 95.2 07/15/2015   PLT 217 07/15/2015      Chemistry      Component Value Date/Time   NA 139 07/15/2015 1105   NA 133* 07/13/2015 1041   K 3.8 07/15/2015 1105   K 4.5 07/13/2015 1041   CL 98* 07/10/2015 0727   CO2 29 07/15/2015 1105   CO2 25 07/10/2015 0727   BUN 53.1* 07/15/2015 1105   BUN 84* 07/10/2015 0727   CREATININE 6.3* 07/15/2015 1105   CREATININE 7.26* 07/10/2015 0727      Component Value Date/Time   CALCIUM 7.8* 07/15/2015 1105   CALCIUM 7.1* 07/10/2015 0727   ALKPHOS 168* 07/15/2015 1105   ALKPHOS 203* 07/07/2015 1815   AST 13 07/15/2015 1105   AST 16 07/07/2015 1815   ALT 11 07/15/2015 1105   ALT 14* 07/07/2015 1815   BILITOT 1.78* 07/15/2015 1105   BILITOT 1.2 07/07/2015 1815       No results found for: LABCA2  No components found for: LABCA125  No results for input(s): INR in the last 168 hours.  Urinalysis    Component Value Date/Time   COLORURINE BROWN* 07/07/2015 1908   APPEARANCEUR CLOUDY* 07/07/2015 1908   LABSPEC 1.014 07/07/2015 1908   LABSPEC 1.030 01/22/2015 1018   PHURINE 7.0 07/07/2015 1908   PHURINE 5.0 01/22/2015 1018   GLUCOSEU NEGATIVE 07/07/2015 1908   GLUCOSEU Negative 01/22/2015 1018   HGBUR LARGE* 07/07/2015 1908   HGBUR Large 01/22/2015 1018   BILIRUBINUR LARGE* 07/07/2015 1908   BILIRUBINUR Negative 01/22/2015 1018   KETONESUR 15* 07/07/2015 1908   KETONESUR Negative 01/22/2015 1018   PROTEINUR >300* 07/07/2015 1908   PROTEINUR 2000 01/22/2015 1018    UROBILINOGEN 0.2 01/22/2015 1018   UROBILINOGEN 0.2 01/31/2014 1050   NITRITE POSITIVE* 07/07/2015 1908   NITRITE Negative 01/22/2015 1018   LEUKOCYTESUR MODERATE* 07/07/2015 1908   LEUKOCYTESUR Color Interference due to blood in urine 01/22/2015 1018    STUDIES: Ct Abdomen Pelvis Wo Contrast  06/30/2015  CLINICAL DATA:  History of multiple myeloma with chronic kidney disease stage 3 and back pain currently undergoing chemotherapy. Nausea, vomiting, diarrhea, fever and chills four days. EXAM: CT CHEST, ABDOMEN AND PELVIS WITHOUT CONTRAST TECHNIQUE: Multidetector CT imaging of the chest, abdomen and pelvis was performed following the standard protocol without IV contrast. COMPARISON:  CT 06/23/2015, 02/03/2014 and 01/30/2014 FINDINGS: CT CHEST Lungs are adequately inflated demonstrate a small amount of bilateral pleural fluid left greater than right with associated atelectasis in the posterior lung bases left greater than right. There are new multiple bilateral well-defined focal pleural masses right greater than left with the largest over the right apex measuring 2.8 x 5.2 cm. These likely represent spread of patient's known multiple myeloma. Airways are normal. Heart is normal size. No definite mediastinal or hilar adenopathy. Small amount contrast within the mid to distal esophagus likely due to dysmotility versus reflux. Remaining mediastinal structures are unremarkable. There are couple hypodense thyroid nodules with the largest measuring 1.2 cm over the left lobe. Patient's known destructive sternal manubrial lesion is unchanged. There is subtle increased lucency over the body of the sternum which is new likely spread of patient's known myeloma. There are several lucencies over the thoracic spine with the most prominent over the T9 vertebral body. CT ABDOMEN AND PELVIS The liver, spleen, gallbladder, pancreas and adrenal glands are within normal. Kidneys normal size without hydronephrosis or  nephrolithiasis. Stable exophytic hypodensity over the mid  pole cortex of the left kidney likely a cyst. Ureters are normal. Stomach is within normal. Small bowel is normal. Appendix is normal. Subtle diverticulosis of the colon most prominent over the right colon. There is no significant free fluid or focal inflammatory change. Few small periaortic lymph nodes. Vascular structures are unremarkable. Pelvic images demonstrate the bladder, prostate and rectum to be within normal. Ill-defined low-attenuation over the right iliac bone likely a myelomatous lesion. Ill-defined lytic process over the right femoral neck spanning nearly the entire width of the femoral neck increasing risk of pathologic fracture. Bone island over the left sacrum. IMPRESSION: No acute findings in the chest, abdomen or pelvis. Lytic lesions within the sternum, spine, right iliac bone and right femoral neck compatible with patient's known myeloma. Note that patient is at risk for pathologic fracture through the right femoral neck. Multiple new bilateral focal pleural masses with the largest over the right apex measuring 2.8 x 5.2 cm likely metastatic disease from patient's multiple myeloma. Small amount of bilateral pleural fluid bibasilar consolidation most typical of atelectasis. Minimal diverticulosis of the colon. Stable 1.3 cm exophytic hypodensity over the mid pole left kidney likely a cyst. Couple small hypodense thyroid nodules with the larger over the left lobe measuring 1.2 cm. Recommend follow-up as clinically indicated. Electronically Signed   By: Marin Olp M.D.   On: 06/30/2015 17:24   Dg Chest 2 View  07/07/2015  CLINICAL DATA:  Fever and leukocytosis EXAM: CHEST  2 VIEW COMPARISON:  07/06/2015 FINDINGS: Cardiac shadow is stable. Dialysis catheter is again seen. The inspiratory effort is improved with improvement of the basilar atelectasis. There remains soft tissue dense along the right chest wall consistent with the given  clinical history. IMPRESSION: Slightly improved aeration with resolution of previously seen atelectasis Electronically Signed   By: Inez Catalina M.D.   On: 07/07/2015 19:05   Dg Chest 2 View  06/25/2015  CLINICAL DATA:  Pt complains of Nausea, Vomiting, diarrhea, fever and chills x 4 days. Hx of multiple myeloma and on chemo. EXAM: CHEST  2 VIEW COMPARISON:  Multiple prior studies including 01/30/2014 and January 22, 2015 FINDINGS: Shallow lung inflation. The heart is mildly prominent in size. There are small bilateral pleural effusions. Minimal streaky density identified at the lung bases, left greater than right. There is right apical extrapleural soft tissue density. Rounded soft tissue density overlies the right posterior lateral fifth rib and there is possible expansion of the right anterior fifth rib. There is a remote fracture of the right second anterior rib. IMPRESSION: 1. Interval change in the appearance of the chest. 2. Increased soft tissue masses and possible expansion of the right anterior fifth rib. 3. 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 Consider further evaluation with CT of the chest as needed. 4. Bilateral lower lobe atelectasis and/or infiltrates, left greater than right. Electronically Signed   By: Nolon Nations M.D.   On: 06/25/2015 15:58   Ct Chest Wo Contrast  06/30/2015  CLINICAL DATA:  History of multiple myeloma with chronic kidney disease stage 3 and back pain currently undergoing chemotherapy. Nausea, vomiting, diarrhea, fever and chills four days. EXAM: CT CHEST, ABDOMEN AND PELVIS WITHOUT CONTRAST TECHNIQUE: Multidetector CT imaging of the chest, abdomen and pelvis was performed following the standard protocol without IV contrast. COMPARISON:  CT 06/23/2015, 02/03/2014 and 01/30/2014 FINDINGS: CT CHEST Lungs are adequately inflated demonstrate a small amount of bilateral pleural fluid left greater than right with associated atelectasis in the posterior lung bases left greater than  right. There are new multiple bilateral well-defined focal pleural masses right greater than left with the largest over the right apex measuring 2.8 x 5.2 cm. These likely represent spread of patient's known multiple myeloma. Airways are normal. Heart is normal size. No definite mediastinal or hilar adenopathy. Small amount contrast within the mid to distal esophagus likely due to dysmotility versus reflux. Remaining mediastinal structures are unremarkable. There are couple hypodense thyroid nodules with the largest measuring 1.2 cm over the left lobe. Patient's known destructive sternal manubrial lesion is unchanged. There is subtle increased lucency over the body of the sternum which is new likely spread of patient's known myeloma. There are several lucencies over the thoracic spine with the most prominent over the T9 vertebral body. CT ABDOMEN AND PELVIS The liver, spleen, gallbladder, pancreas and adrenal glands are within normal. Kidneys normal size without hydronephrosis or nephrolithiasis. Stable exophytic hypodensity over the mid pole cortex of the left kidney likely a cyst. Ureters are normal. Stomach is within normal. Small bowel is normal. Appendix is normal. Subtle diverticulosis of the colon most prominent over the right colon. There is no significant free fluid or focal inflammatory change. Few small periaortic lymph nodes. Vascular structures are unremarkable. Pelvic images demonstrate the bladder, prostate and rectum to be within normal. Ill-defined low-attenuation over the right iliac bone likely a myelomatous lesion. Ill-defined lytic process over the right femoral neck spanning nearly the entire width of the femoral neck increasing risk of pathologic fracture. Bone island over the left sacrum. IMPRESSION: No acute findings in the chest, abdomen or pelvis. Lytic lesions within the sternum, spine, right iliac bone and right femoral neck compatible with patient's known myeloma. Note that patient is  at risk for pathologic fracture through the right femoral neck. Multiple new bilateral focal pleural masses with the largest over the right apex measuring 2.8 x 5.2 cm likely metastatic disease from patient's multiple myeloma. Small amount of bilateral pleural fluid bibasilar consolidation most typical of atelectasis. Minimal diverticulosis of the colon. Stable 1.3 cm exophytic hypodensity over the mid pole left kidney likely a cyst. Couple small hypodense thyroid nodules with the larger over the left lobe measuring 1.2 cm. Recommend follow-up as clinically indicated. Electronically Signed   By: Marin Olp M.D.   On: 06/30/2015 17:24   Mr Brain Wo Contrast  07/06/2015  CLINICAL DATA:  Multiple myeloma undergoing chemotherapy. Severe nausea and vomiting. Posterior reversible encephalopathy. Follow-up. EXAM: MRI HEAD WITHOUT CONTRAST TECHNIQUE: Multiplanar, multiecho pulse sequences of the brain and surrounding structures were obtained without intravenous contrast. COMPARISON:  06/26/2015 FINDINGS: Diffusion imaging does not show any acute or subacute infarction. There is resolving cortical and subcortical edema of the brain bilaterally in a posterior distribution. No evidence of hemorrhagic transformation. No evidence of hydrocephalus or extra-axial collection. Mucosal thickening and a small amount of fluid in the right maxillary sinus again noted. Multiple calvarial lesions unchanged since the previous study. IMPRESSION: Cortical and subcortical edema primarily affecting the posterior brain, improving when compared to the study of 06/26/2015, as expected with successful treatment/resolution of posterior reversible encephalopathy. No evidence of infarctions or hemorrhagic change. Multiple myeloma skull lesions as seen previously. Electronically Signed   By: Nelson Chimes M.D.   On: 07/06/2015 20:16   Mr Brain Wo Contrast  06/26/2015  CLINICAL DATA:  Bilateral blurry vision. Fever. History of multiple myeloma  on chemotherapy. Elevated blood pressures. EXAM: MRI HEAD WITHOUT CONTRAST TECHNIQUE: Multiplanar, multiecho pulse sequences of the brain and surrounding structures  were obtained without intravenous contrast. COMPARISON:  Head CT 10/11/2010 FINDINGS: There is abnormal T2 hyperintensity involving cortex and subcortical white matter in both occipital lobes and left greater than right parietal lobes. No restricted diffusion or hemorrhage is seen. The brain is otherwise normal in signal aside from scattered, punctate foci of T2 hyperintensity in the cerebral white matter which are nonspecific. No mass, midline shift, or extra-axial fluid collection is seen. Ventricles are normal in size. Orbits are unremarkable. A small amount of fluid is present in the right maxillary sinus. The mastoid air cells are clear. Major intracranial vascular flow voids are preserved. There is a 1.4 cm left frontal skull lesion with extension through the inner and outer tables of the skull and small volume epidural tumor without significant mass effect on the underlying brain. No brain edema. Multiple other, smaller lesions are scattered elsewhere in the skull. Baseline bone marrow signal is diffusely diminished throughout the skull and visualized upper cervical spine and may be related to ongoing treatment. IMPRESSION: 1. 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 2. Multiple skull lesions consistent with known multiple myeloma. 1.4 cm left frontal lesion with mild epidural extension. Electronically Signed   By: Logan Bores M.D.   On: 06/26/2015 12:22   Mr Cervical Spine Wo Contrast  07/09/2015  CLINICAL DATA:  Initial evaluation for right arm weakness. EXAM: MRI CERVICAL SPINE WITHOUT CONTRAST TECHNIQUE: Multiplanar, multisequence MR imaging of the cervical spine was performed. No intravenous contrast was administered. COMPARISON:  None. FINDINGS: Alignment: Ir to row bodies normally aligned with  preservation of the normal cervical lordosis. No listhesis or malalignment. Vertebrae: Diffusely abnormal bone marrow with multiple osseous lesions present, consistent with known multiple myeloma. No extra osseous extension of tumor. No epidural tumor. No pathologic fracture. Cord: Signal intensity within the cervical spinal cord is normal. Posterior Fossa, vertebral arteries, paraspinal tissues: Visualized posterior fossa a grossly unremarkable. Changes related to known pressed are not well appreciated on this exam. Craniocervical junction normal. Paraspinous soft tissues within normal limits. No prevertebral or paravertebral edema. Normal intravascular flow voids present within the vertebral arteries bilaterally. Disc levels: C2-C3: Negative. C3-C4: Mild bilateral uncovertebral hypertrophy, left slightly worse than right. Mild left foraminal stenosis. No canal or right foraminal narrowing. C4-C5: Mild degenerative disc bulge with bilateral uncovertebral hypertrophy. No significant canal stenosis. Minimal bilateral foraminal narrowing. C5-C6:  Minimal uncovertebral hypertrophy.  No stenosis. C6-C7:  Negative. C7-T1:  Negative. IMPRESSION: 1. Diffusely abnormal bone marrow, consistent with known history of multiple myeloma. No extraosseous or epidural tumor. No pathologic fracture or other complication. 2. Relatively mild multilevel degenerative spondylolysis as above. No significant canal or foraminal stenosis. No findings to explain right upper extremity symptoms. Electronically Signed   By: Jeannine Boga M.D.   On: 07/09/2015 01:48   Mr Thoracic Spine Wo Contrast  07/02/2015  CLINICAL DATA:  Multiple myeloma.  Back pain.  Fever EXAM: MRI THORACIC AND LUMBAR SPINE WITHOUT CONTRAST TECHNIQUE: Multiplanar and multiecho pulse sequences of the thoracic and lumbar spine were obtained without intravenous contrast. COMPARISON:  CT chest abdomen and pelvis 06/30/2015 FINDINGS: MRI THORACIC SPINE FINDINGS  Alignment:  Normal Vertebrae: Diffuse infiltration of the bone marrow throughout all vertebral bodies consistent with myeloma. This also involves the posterior elements and ribs bilaterally. There is extradural tumor in the spinal canal posteriorly at T4, T7, and T 10-11. Extraosseous mass lesions in the ribs bilaterally consistent with myeloma. Cord: Posterior extradural mass at the T4  level causing mild spinal stenosis. Posterior extradural mass at the T7 level causing moderate spinal stenosis and mild compression of the cord. Cord has normal signal. Mild posterior extradural tumor at T10-11 extending to the right. This is causing right foraminal stenosis. No significant spinal stenosis. Paraspinal and other soft tissues: Paraspinous muscles normal. Small bilateral pleural effusions. Diffuse rib infiltration by mall myeloma with multiple extradural rib lesions. Disc levels: Negative for disc protrusion. No significant disc degeneration. MRI LUMBAR SPINE FINDINGS Segmentation:  Normal.  Lowest disc space L5-S1 Alignment:  Normal Vertebrae: Diffuse infiltration of the bone marrow by myeloma. This involves all vertebral bodies and extends into the posterior elements. There is involvement of the pelvis. Sacrum is diffusely involved. Negative for fracture. Conus medullaris: Extends to the L1 level and appears normal. Paraspinal and other soft tissues: No retroperitoneal adenopathy. Extraosseous mass from the left iliac wing displacing iliopsoas anteriorly Disc levels: L1-2:  Negative L2-3:  Negative L3-4:  Bilateral facet degeneration.  Normal disc space L4-5:  Bilateral facet degeneration.  Normal disc space L5-S1: Small central disc protrusion without neural impingement. IMPRESSION: MR THORACIC SPINE IMPRESSION Extensive and diffuse infiltration of the bone marrow with myeloma. No fracture. Posterior extradural mass the T4 level causing mild spinal stenosis. Posterior epidural mass at the T7 level causing moderate  spinal stenosis and mild compression of the cord. MR LUMBAR SPINE IMPRESSION Diffuse bone marrow infiltration throughout the lumbar spine, sacrum, and pelvis. No fracture. No tumor is seen in the lumbar spinal canal. No evidence of infection in the thoracic or lumbar spine. Electronically Signed   By: Franchot Gallo M.D.   On: 07/02/2015 09:00   Mr Lumbar Spine Wo Contrast  07/02/2015  CLINICAL DATA:  Multiple myeloma.  Back pain.  Fever EXAM: MRI THORACIC AND LUMBAR SPINE WITHOUT CONTRAST TECHNIQUE: Multiplanar and multiecho pulse sequences of the thoracic and lumbar spine were obtained without intravenous contrast. COMPARISON:  CT chest abdomen and pelvis 06/30/2015 FINDINGS: MRI THORACIC SPINE FINDINGS Alignment:  Normal Vertebrae: Diffuse infiltration of the bone marrow throughout all vertebral bodies consistent with myeloma. This also involves the posterior elements and ribs bilaterally. There is extradural tumor in the spinal canal posteriorly at T4, T7, and T 10-11. Extraosseous mass lesions in the ribs bilaterally consistent with myeloma. Cord: Posterior extradural mass at the T4 level causing mild spinal stenosis. Posterior extradural mass at the T7 level causing moderate spinal stenosis and mild compression of the cord. Cord has normal signal. Mild posterior extradural tumor at T10-11 extending to the right. This is causing right foraminal stenosis. No significant spinal stenosis. Paraspinal and other soft tissues: Paraspinous muscles normal. Small bilateral pleural effusions. Diffuse rib infiltration by mall myeloma with multiple extradural rib lesions. Disc levels: Negative for disc protrusion. No significant disc degeneration. MRI LUMBAR SPINE FINDINGS Segmentation:  Normal.  Lowest disc space L5-S1 Alignment:  Normal Vertebrae: Diffuse infiltration of the bone marrow by myeloma. This involves all vertebral bodies and extends into the posterior elements. There is involvement of the pelvis. Sacrum is  diffusely involved. Negative for fracture. Conus medullaris: Extends to the L1 level and appears normal. Paraspinal and other soft tissues: No retroperitoneal adenopathy. Extraosseous mass from the left iliac wing displacing iliopsoas anteriorly Disc levels: L1-2:  Negative L2-3:  Negative L3-4:  Bilateral facet degeneration.  Normal disc space L4-5:  Bilateral facet degeneration.  Normal disc space L5-S1: Small central disc protrusion without neural impingement. IMPRESSION: MR THORACIC SPINE IMPRESSION Extensive and diffuse infiltration of  the bone marrow with myeloma. No fracture. Posterior extradural mass the T4 level causing mild spinal stenosis. Posterior epidural mass at the T7 level causing moderate spinal stenosis and mild compression of the cord. MR LUMBAR SPINE IMPRESSION Diffuse bone marrow infiltration throughout the lumbar spine, sacrum, and pelvis. No fracture. No tumor is seen in the lumbar spinal canal. No evidence of infection in the thoracic or lumbar spine. Electronically Signed   By: Franchot Gallo M.D.   On: 07/02/2015 09:00   US Renal Port  06/24/2015  CLINICAL DATA:  Acute kidney injury. EXAM: RENAL / URINARY TRACT ULTRASOUND COMPLETE COMPARISON:  CT scan of Jun 23, 2015. FINDINGS: Right Kidney: Length: 12.1 cm. Mildly increased echogenicity of renal parenchyma is noted. No mass or hydronephrosis visualized. Left Kidney: Length: 11.9 cm. Mildly increased echogenicity of renal parenchyma is noted. 1.6 cm simple exophytic cyst is seen arising from midpole. No mass or hydronephrosis visualized. Bladder: Appears normal for degree of bladder distention. IMPRESSION: Mildly increased echogenicity of renal parenchyma is noted bilaterally suggesting medical renal disease. No hydronephrosis or renal obstruction is noted. Electronically Signed   By: Marijo Conception, M.D.   On: 06/24/2015 09:50   Ir Fluoro Guide Cv Line Right  07/03/2015  INDICATION: 40 year old male with a history of renal failure.  He has been referred for hemodialysis catheter placement. EXAM: TUNNELED CENTRAL VENOUS HEMODIALYSIS CATHETER PLACEMENT WITH ULTRASOUND AND FLUOROSCOPIC GUIDANCE MEDICATIONS: 2.0 g Ancef . The antibiotic was given in an appropriate time interval prior to skin puncture. ANESTHESIA/SEDATION: Moderate (conscious) sedation was employed during this procedure. A total of Versed 1.5 mg and Fentanyl 25 mcg was administered intravenously. Moderate Sedation Time: 15 minutes. The patient's level of consciousness and vital signs were monitored continuously by radiology nursing throughout the procedure under my direct supervision. FLUOROSCOPY TIME:  Fluoroscopy Time: 0 minutes 6 seconds (2 mGy). COMPLICATIONS: None PROCEDURE: Informed written consent was obtained from the patient after a discussion of the risks, benefits, and alternatives to treatment. Questions regarding the procedure were encouraged and answered. The right neck and chest were prepped with chlorhexidine in a sterile fashion, and a sterile drape was applied covering the operative field. Maximum barrier sterile technique with sterile gowns and gloves were used for the procedure. A timeout was performed prior to the initiation of the procedure. After creating a small venotomy incision, a micropuncture kit was utilized to access the right internal jugular vein under direct, real-time ultrasound guidance after the overlying soft tissues were anesthetized with 1% lidocaine with epinephrine. Ultrasound image documentation was performed. The microwire was marked to measure appropriate internal catheter length. External tunneled length was estimated. A total tip to cuff length of 19 cm was selected. Skin and subcutaneous tissues of chest wall below the clavicle were generously infiltrated with 1% lidocaine for local anesthesia. A small stab incision was made with 11 blade scalpel. The selected hemodialysis catheter was tunneled in a retrograde fashion from the anterior  chest wall to the venotomy incision. A guidewire was advanced to the level of the IVC and the micropuncture sheath was exchanged for a peel-away sheath. The catheter was then placed through the peel-away sheath with tips ultimately positioned within the superior aspect of the right atrium. Final catheter positioning was confirmed and documented with a spot radiographic image. The catheter aspirates and flushes normally. The catheter was flushed with appropriate volume heparin dwells. The catheter exit site was secured with a 0-Prolene retention suture. The venotomy incision was closed Derma  bond and sterile dressing. Dressings were applied at the chest wall. Patient tolerated the procedure well and remained hemodynamically stable throughout. No complications were encountered and no significant blood loss encountered. IMPRESSION: Status post placement of right IJ approach hemodialysis catheter measuring 19 cm tip to cuff. Catheter ready for use. Signed, Dulcy Fanny. Earleen Newport, DO [facility] Electronically Signed   By: Corrie Mckusick D.O.   On: 07/03/2015 18:01   Ir US Guide Vasc Access Right  07/03/2015  INDICATION: 40 year old male with a history of renal failure. He has been referred for hemodialysis catheter placement. EXAM: TUNNELED CENTRAL VENOUS HEMODIALYSIS CATHETER PLACEMENT WITH ULTRASOUND AND FLUOROSCOPIC GUIDANCE MEDICATIONS: 2.0 g Ancef . The antibiotic was given in an appropriate time interval prior to skin puncture. ANESTHESIA/SEDATION: Moderate (conscious) sedation was employed during this procedure. A total of Versed 1.5 mg and Fentanyl 25 mcg was administered intravenously. Moderate Sedation Time: 15 minutes. The patient's level of consciousness and vital signs were monitored continuously by radiology nursing throughout the procedure under my direct supervision. FLUOROSCOPY TIME:  Fluoroscopy Time: 0 minutes 6 seconds (2 mGy). COMPLICATIONS: None  PROCEDURE: Informed written consent was obtained from the patient after a discussion of the risks, benefits, and alternatives to treatment. Questions regarding the procedure were encouraged and answered. The right neck and chest were prepped with chlorhexidine in a sterile fashion, and a sterile drape was applied covering the operative field. Maximum barrier sterile technique with sterile gowns and gloves were used for the procedure. A timeout was performed prior to the initiation of the procedure. After creating a small venotomy incision, a micropuncture kit was utilized to access the right internal jugular vein under direct, real-time ultrasound guidance after the overlying soft tissues were anesthetized with 1% lidocaine with epinephrine. Ultrasound image documentation was performed. The microwire was marked to measure appropriate internal catheter length. External tunneled length was estimated. A total tip to cuff length of 19 cm was selected. Skin and subcutaneous tissues of chest wall below the clavicle were generously infiltrated with 1% lidocaine for local anesthesia. A small stab incision was made with 11 blade scalpel. The selected hemodialysis catheter was tunneled in a retrograde fashion from the anterior chest wall to the venotomy incision. A guidewire was advanced to the level of the IVC and the micropuncture sheath was exchanged for a peel-away sheath. The catheter was then placed through the peel-away sheath with tips ultimately positioned within the superior aspect of the right atrium. Final catheter positioning was confirmed and documented with a spot radiographic image. The catheter aspirates and flushes normally. The catheter was flushed with appropriate volume heparin dwells. The catheter exit site was secured with a 0-Prolene retention suture. The venotomy incision was closed Derma bond and sterile dressing. Dressings were applied at the chest wall. Patient tolerated the procedure well and  remained hemodynamically stable throughout. No complications were encountered and no significant blood loss encountered. IMPRESSION: Status post placement of right IJ approach hemodialysis catheter measuring 19 cm tip to cuff. Catheter ready for use. Signed, Dulcy Fanny. Earleen Newport, DO [facility] Radiology Electronically Signed   By: Corrie Mckusick D.O.   On: 07/03/2015 18:01   Dg Chest Port 1 View  07/06/2015  CLINICAL DATA:  Status post dialysis EXAM: PORTABLE CHEST 1 VIEW COMPARISON:  06/25/2015 FINDINGS: Cardiac shadow is mildly enlarged. A new dialysis catheter is noted. Lungs are hypoinflated with mild basilar atelectasis. No focal confluent infiltrate is noted. Stable  nodular changes are noted on the right related to pleural based lesions IMPRESSION: Mild bibasilar atelectasis. The remainder of the exam is relatively stable from the prior study. Electronically Signed   By: Inez Catalina M.D.   On: 07/06/2015 18:17   Ct Renal Stone Study  06/23/2015  CLINICAL DATA:  History multiple myeloma currently on chemotherapy. Chronic kidney disease. Nausea, vomiting and diarrhea 4 days. Generalized abdominal pain with chills and weakness as well as dysuria and dark urine. Mid back pain. EXAM: CT ABDOMEN AND PELVIS WITHOUT CONTRAST TECHNIQUE: Multidetector CT imaging of the abdomen and pelvis was performed following the standard protocol without IV contrast. COMPARISON:  12/28/2014 and 02/03/2014 FINDINGS: Lung bases demonstrate very small bilateral pleural effusions with associated dependent atelectasis. Minimal cardiomegaly. The Abdominal images demonstrate the liver, spleen pancreas, gallbladder and adrenal glands to be within normal. Vascular structures are within normal. Kidneys are normal in size without hydronephrosis or nephrolithiasis. 1.3 cm exophytic cyst over the mid pole of the left kidney. Ureters are normal. Stomach is normal. Appendix is normal. Small bowel is  within normal. There is mild diverticulosis of the colon. Pelvic images demonstrate the bladder, prostate and rectum to be within normal. Stable 1 cm sclerotic focus over the left sacrum likely a bone island. Old right lower anterior rib fracture. Focal lucency along the left [name] of the T9 vertebral body with subtle patchy low density T7 and T8 not well seen previously and may represent evidence of patient's multiple myeloma. IMPRESSION: No acute findings in the abdomen/ pelvis. Subtle lucencies over the T7, T8 and T9 vertebral bodies not well seen previously and likely due to patient's known multiple myeloma. No compression fracture. Small bilateral pleural effusions with associated basilar atelectasis. Mild diverticulosis of the colon. 1.3 cm left renal cyst. Electronically Signed   By: Marin Olp M.D.   On: 06/23/2015 17:33    ASSESSMENT: 40 y.o. 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;        I: CyBorD started January 2016  (a) dexamethasone 20 mg/d every TU/WED started 02/04/2014  (b) bortezomib sQ days 1,4,8,11 of every 21 day cycle started 02/10/2014  (c) cyclophosphamide 300 mg/M2 IV weekly started 02/13/2014       2: Lenalidomide 18m daily started 07/26/2014, discontinued December 2016 with progression  (2) chronic kidney disease stage III  (3) hypercalcemia: zolendronate started 02/10/2014-- repeat every 4 weeks initially, then every 12 weeks  (a) changed to denosumab/Xgeva starting 02/26/2015 because of CKD, repeated every 12 weeks  (4) ID  prophylaxis:  (a) influenza and Pneumovax [PV13] vaccines 02/07/2014  (b) acyclovir started 02/07/2014  (c) HIV negative 01/31/2014  (d) Pneumovax P23 too be given after March 2016  (5). The patient is not a transplant candidate for financial reasons  (6) bilateral foot rash: Started on Septra DS and Diflucan 09/03/2014, resolved  (7) Multiple Myeloma relapse in December 2016.   (a) starting 02/05/15: dexamethasone 28mx2 consecutive days weekly; bortezomib sQ weekly,   (b) Septra DS twice daily  (c) acyclovir 40030maily   (d) bortezomib/ dexamethasone discontinued after 05/06/2015 dose with evidence  of progression  (8) started carfilzomib 05/28/2015, given with weekly low dose dexamethasone  (a) tolerated cyclophosphamide poorly, discontinued after cycle 1  (b) carfilzomib started 05/28/2015  (c) taking dexamethasone 20 mg daily 2 days a week, not on treatment days)   (9) hemodialysis on Tues, Thus, Sat  PLAN: Rasheem is tolerating treatment well. The labs were reviewed in detail. His bilirubin is up to 1.78. I consulted with Dr. Jana Hakim and he suggested proceeding with day 15, cycle 2 of carfilzomib as planned today.   Jerric will return in 2 weeks of the start of cycle 3 of treatment. He understands and agrees with this plan. He knows the goal of treatment in his case is control. He has been encouraged to call with any issues that might arise before his next visit here.    Laurie Panda, NP   07/15/2015 2:41 PM

## 2015-07-15 NOTE — Patient Instructions (Addendum)
TAKE DECADRON ON Sunday AND Monday!!!!   Carfilzomib injection Qu es este medicamento? CARFILZOMIB acta sobre una protena especfica dentro de las clulas cancerosas y detiene el crecimiento de las clulas cancerosas. Este medicamento se [place] para tratar [employer]. Este medicamento puede ser utilizado para otros usos; si tiene alguna pregunta consulte con su proveedor de atencin mdica o con su farmacutico. Qu le debo informar a mi profesional de la salud antes de tomar este medicamento? Necesita saber si usted presenta alguno de los siguientes problemas o situaciones: enfermedad cardiaca antecedentes de cogulos sanguneos latidos cardacos irregulares enfermedad heptica enfermedad renal enfermedad pulmonar o respiratoria una reaccin alrgica o inusual al carfilzomib, a otros medicamentos, alimentos, colorantes o conservantes si est embarazada o buscando quedar embarazada si est amamantando a un beb Cmo debo utilizar este medicamento? Este medicamento se administra mediante inyeccin o infusin por va intravenosa. Lo administra un profesional de Technical sales engineer en un hospital o en un entorno clnico. Hable con su pediatra para informarse acerca del uso de este medicamento en nios. Puede requerir atencin especial. Sobredosis: Pngase en contacto inmediatamente con un centro toxicolgico o una sala de urgencia si usted cree que haya tomado demasiado medicamento. ATENCIN: [employer] es solo para usted. No comparta este medicamento con nadie. Qu sucede si me olvido de una dosis? Es importante no olvidar ninguna dosis. Informe a su mdico o a su profesional de la salud si no puede asistir a Photographer. Qu puede interactuar con este medicamento? No se esperan interacciones. Puede ser que esta lista no menciona todas las posibles interacciones. Informe a su profesional de [employer]	[place] de [employer] productos a base de hierbas, medicamentos de [place] o suplementos  nutritivos que est tomando. Si usted fuma, consume bebidas alcohlicas o si utiliza drogas ilegales, indqueselo tambin a su profesional de [employer]	[place]. Algunas sustancias pueden interactuar con su medicamento. A qu debo estar atento al usar [employer]? Se supervisar su estado de salud atentamente mientras reciba este medicamento. Si presenta alguno de los [employer], infrmelos. Contine con el tratamiento aun si se siente enfermo, a menos que su mdico le indique que lo suspenda. Usted podr Runner, broadcasting/film/video de sangre mientras est usando este medicamento. No se debe quedar embarazada mientras est tomando este medicamento o durante 2 semanas despus de dejar de tomarlo. Las mujeres deben informar a su mdico si estn buscando quedar embarazadas o si creen que estn embarazadas. Existe la posibilidad de efectos secundarios graves a un beb sin nacer. Para ms informacin hable con su profesional de la salud o su farmacutico. No debe amamantar a un beb mientras est tomando este medicamento. Consulte a su mdico o su profesional de la salud si tiene un ataque grave de diarrea, nuseas y vmito o si suda demasiado. La prdida demasiado de lquido del cuerpo puede ser peligroso para que usted toma [employer]. Puede experimentar mareos. No conduzca ni utilice maquinaria ni haga nada que Associate Professor en estado de alerta hasta que sepa cmo le afecta este medicamento. No se siente ni se ponga de pie con rapidez, especialmente si es un paciente de edad avanzada. Esto reduce el riesgo de mareos o [employer]. Qu efectos secundarios puedo tener al [employer] este medicamento? Efectos secundarios que debe informar a su mdico o a Barrister's clerk de la salud tan pronto como sea posible: Chief of Staff como erupcin cutnea, picazn o urticarias, hinchazn de la cara, labios o lengua confusin mareos sensacin de desmayos o  aturdimiento fiebre o escalofros palpitaciones  signos y sntomas de un cogulo sanguneo tales como problemas respiratorios; cambios en la visin; dolor en el pecho; dolor de cabeza severo, repentino; dolor, hinchazn, clida en la pierna; dificultad para hablar; entumecimiento o debilidad repentina de la cara, brazo o pierna signos y sntomas de lesin al rin como dificultad para Garment/textile technologist o cambios en la cantidad de orina signos y sntomas de lesin al hgado como orina amarillo oscuro o Forensic psychologist; sensacin general de estar enfermo o sntomas gripales; heces claras; prdida de apetito; nuseas; dolor en la regin abdominal superior derecha; cansancio o debilidad inusual; color amarillento de los ojos o la piel sangrado, magulladuras inusuales Efectos secundarios que, por lo general, no requieren Geophysical data processor (debe informarlos a su mdico o a Barrister's clerk de la salud si persisten o si son molestos): dolor de espalda tos diarrea dolor de cabeza calambres musculares vmito Puede ser que esta lista no menciona todos los posibles efectos secundarios. Comunquese a su mdico por asesoramiento mdico [employer] Usted puede informar los efectos secundarios a la FDA por telfono al 1-800-FDA-1088. Dnde debo guardar mi medicina? Este medicamento se administra en hospitales o clnicas y no necesitar guardarlo en su domicilio. ATENCIN: Este folleto es un resumen. Puede ser que no cubra toda la posible informacin. Si usted tiene preguntas acerca de esta medicina, consulte con su mdico, su farmacutico o su profesional de Technical sales engineer.    2016, Elsevier/Gold Standard. (2014-03-12 00:00:00)   [facility] Discharge Instructions for Patients Receiving Chemotherapy  Today you received the following chemotherapy agents:  Kyprolis  To help prevent nausea and vomiting after your treatment, we encourage you to take your nausea medication as prescribed.   If you develop nausea and vomiting that is not controlled by your nausea  medication, call the clinic.   BELOW ARE SYMPTOMS THAT SHOULD BE REPORTED IMMEDIATELY:  *FEVER GREATER THAN 100.5 F  *CHILLS WITH OR WITHOUT FEVER  NAUSEA AND VOMITING THAT IS NOT CONTROLLED WITH YOUR NAUSEA MEDICATION  *UNUSUAL SHORTNESS OF BREATH  *UNUSUAL BRUISING OR BLEEDING  TENDERNESS IN MOUTH AND THROAT WITH OR WITHOUT PRESENCE OF ULCERS  *URINARY PROBLEMS  *BOWEL PROBLEMS  UNUSUAL RASH Items with * indicate a potential emergency and should be followed up as soon as possible.  Feel free to call the clinic you have any questions or concerns. The clinic phone number is [phone].  Please show the [place] at check-in to the Emergency Department and triage nurse. Dialysis Dialysis is a procedure that replaces some of the work healthy kidneys do. It is done when you lose about 85-90% of your kidney function. It may also be done earlier if your symptoms may be improved by dialysis. During dialysis, wastes, salt, and extra water are removed from the blood, and the levels of certain chemicals in the blood (such as potassium) are maintained. Dialysis is done in sessions. Dialysis sessions are continued until the kidneys get better. If the kidneys cannot get better, such as in end-stage kidney disease, dialysis is continued for life or until you receive a new kidney (kidney transplant). There are two types of dialysis: hemodialysis and peritoneal dialysis. WHAT IS HEMODIALYSIS?  Hemodialysis is a type of dialysis in which a machine called a dialyzer is used to filter the blood. Before beginning hemodialysis, you will have surgery to create a site where blood can be removed from the body and returned to the body (vascular access). There are  three types of vascular accesses:  Arteriovenous fistula. To create this type of access, an artery is connected to a vein (usually in the arm). A fistula takes 1-6 months to develop after surgery. If it develops properly, it usually lasts  longer than the other types of vascular accesses. It is also less likely to become infected and cause blood clots.  Arteriovenous graft. To create this type of access, an artery and a vein in the arm are connected with a tube. A graft may be used within 2-3 weeks of surgery.  A venous catheter. To create this type of access, a thin, flexible tube (catheter) is placed in a large vein in your neck, chest, or groin. A catheter may be used right away. It is usually used as a temporary access when dialysis needs to begin immediately. During hemodialysis, blood leaves the body through your access. It travels through a tube to the dialyzer, where it is filtered. The blood then returns to your body through another tube. Hemodialysis is usually performed by a health care provider at a hospital or dialysis center three times a week. Visits last about 3-4 hours. It may also be performed with the help of another person at home with training.  WHAT IS PERITONEAL DIALYSIS? Peritoneal dialysis is a type of dialysis in which the thin lining of the abdomen (peritoneum) is used as a filter. Before beginning peritoneal dialysis, you will have surgery to place a catheter in your abdomen. The catheter will be used to transfer a fluid called dialysate to and from your abdomen. At the start of a session, your abdomen is filled with dialysate. During the session, wastes, salt, and extra water in the blood pass through the peritoneum and into the dialysate. The dialysate is drained from the body at the end of the session. The process of filling and draining the dialysate is called an exchange. Exchanges are repeated until you have used up all the dialysate for the day. Peritoneal dialysis may be performed by you at home or at almost any other location. It is done every day. You may need up to five exchanges a day. The amount of time the dialysate is in your body between exchanges is called a dwell. The dwell depends on the number  of exchanges needed and the characteristics of the peritoneum. It usually varies from 1.5-3 hours. You may go about your day normally between exchanges. Alternately, the exchanges may be done at night while you sleep, using a machine called a cycler. WHICH TYPE OF DIALYSIS SHOULD I CHOOSE?  Both hemodialysis and peritoneal dialysis have advantages and disadvantages. Talk to your health care provider about which type of dialysis would be best for you. Your lifestyle and preferences should be considered along with your medical condition. In some cases, only one type of dialysis may be an option.  Advantages of hemodialysis  It is done less often than peritoneal dialysis.  Someone else can do the dialysis for you.  If you go to a dialysis center, your health care provider will be able to recognize any problems right away.  If you go to a dialysis center, you can interact with others who are having dialysis. This can provide you with emotional support. Disadvantages of hemodialysis  Hemodialysis may cause cramps and low blood pressure. It may leave you feeling tired on the days you have the treatment.  If you go to a dialysis center, you will need to make weekly appointments and work around the  center's schedule.  You will need to take extra care when traveling. If you go to a dialysis center, you will need to make special arrangements to visit a dialysis center near your destination. If you are having treatments at home, you will need to take the dialyzer with you to your destination.  You will need to avoid more foods than you would need to avoid on peritoneal dialysis. Advantages of peritoneal dialysis  It is less likely than hemodialysis to cause cramps and low blood pressure.  You may do exchanges on your own wherever you are, including when you travel.  You do not need to avoid as many foods as you do on hemodialysis. Disadvantages of peritoneal dialysis  It is done more often than  hemodialysis.  Performing peritoneal dialysis requires you to have dexterity of the hands. You must also be able to lift bags.  You will have to learn sterilization techniques. You will need to practice them every day to reduce the risk of infection. WHAT CHANGES WILL I NEED TO MAKE TO MY DIET DURING DIALYSIS? Both hemodialysis and peritoneal dialysis require you to make some changes to your diet. For example, you will need to limit your intake of foods high in the minerals phosphorus and potassium. You will also need to limit your fluid intake. Your dietitian can help you plan meals. A good meal plan can improve your dialysis and your health.  WHAT SHOULD I EXPECT WHEN BEGINNING DIALYSIS? Adjusting to the dialysis treatment, schedule, and diet can take some time. You may need to stop working and may not be able to do some of the things you normally do. You may feel anxious or depressed when beginning dialysis. Eventually, many people feel better overall because of dialysis. Some people are able to return to work after making some changes, such as reducing work intensity. WHERE CAN I FIND MORE INFORMATION?   [place]: www.kidney.org  American Association of Kidney Patients: BombTimer.gl  American Kidney Fund: www.kidneyfund.org   This information is not intended to replace advice given to you by your health care provider. Make sure you discuss any questions you have with your health care provider.   Document Released: 04/09/2002 Document Revised: 02/07/2014 Document Reviewed: 03/13/2012 Elsevier Interactive Patient Education [employer].

## 2015-07-16 ENCOUNTER — Ambulatory Visit: Payer: Self-pay

## 2015-07-16 ENCOUNTER — Ambulatory Visit (HOSPITAL_BASED_OUTPATIENT_CLINIC_OR_DEPARTMENT_OTHER): Payer: Self-pay

## 2015-07-16 ENCOUNTER — Ambulatory Visit
Admission: RE | Admit: 2015-07-16 | Discharge: 2015-07-16 | Disposition: A | Discharge status: Home / Self Care | Payer: Self-pay | Source: Ambulatory Visit | Attending: Radiation Oncology | Admitting: Radiation Oncology

## 2015-07-16 VITALS — BP 120/70 | HR 69 | Temp 98.8°F | Resp 18

## 2015-07-16 DIAGNOSIS — C9000 Multiple myeloma not having achieved remission: Secondary | ICD-10-CM

## 2015-07-16 DIAGNOSIS — Z5112 Encounter for antineoplastic immunotherapy: Secondary | ICD-10-CM

## 2015-07-16 LAB — KAPPA/LAMBDA LIGHT CHAINS
IG LAMBDA FREE LIGHT CHAIN: 22.9 mg/L (ref 5.7–26.3)
Ig Kappa Free Light Chain: 183.5 mg/L — ABNORMAL HIGH (ref 3.3–19.4)
KAPPA/LAMBDA FLC RATIO: 8.01 — AB (ref 0.26–1.65)

## 2015-07-16 LAB — BETA 2 MICROGLOBULIN, SERUM: Beta-2: 31.5 mg/L — ABNORMAL HIGH (ref 0.6–2.4)

## 2015-07-16 MED ORDER — SODIUM CHLORIDE 0.9 % IV SOLN
10.0000 mg | Freq: Once | INTRAVENOUS | Status: AC
  Administered 2015-07-16: 10 mg via INTRAVENOUS
  Filled 2015-07-16: qty 1

## 2015-07-16 MED ORDER — PROCHLORPERAZINE MALEATE 10 MG PO TABS
ORAL_TABLET | ORAL | Status: AC
  Filled 2015-07-16: qty 1

## 2015-07-16 MED ORDER — SODIUM CHLORIDE 0.9 % IV SOLN
Freq: Once | INTRAVENOUS | Status: AC
  Administered 2015-07-16: 15:00:00 via INTRAVENOUS

## 2015-07-16 MED ORDER — CARFILZOMIB CHEMO INJECTION 60 MG
27.0000 mg/m2 | Freq: Once | INTRAVENOUS | Status: AC
  Administered 2015-07-16: 46 mg via INTRAVENOUS
  Filled 2015-07-16: qty 23

## 2015-07-16 MED ORDER — PROCHLORPERAZINE MALEATE 10 MG PO TABS
10.0000 mg | ORAL_TABLET | Freq: Once | ORAL | Status: AC
  Administered 2015-07-16: 10 mg via ORAL

## 2015-07-16 NOTE — Patient Instructions (Signed)
[facility] Discharge Instructions for Patients Receiving Chemotherapy  Today you received the following chemotherapy agents Kyprolis.  To help prevent nausea and vomiting after your treatment, we encourage you to take your nausea medication as prescribed.   If you develop nausea and vomiting that is not controlled by your nausea medication, call the clinic.   BELOW ARE SYMPTOMS THAT SHOULD BE REPORTED IMMEDIATELY:  *FEVER GREATER THAN 100.5 F  *CHILLS WITH OR WITHOUT FEVER  NAUSEA AND VOMITING THAT IS NOT CONTROLLED WITH YOUR NAUSEA MEDICATION  *UNUSUAL SHORTNESS OF BREATH  *UNUSUAL BRUISING OR BLEEDING  TENDERNESS IN MOUTH AND THROAT WITH OR WITHOUT PRESENCE OF ULCERS  *URINARY PROBLEMS  *BOWEL PROBLEMS  UNUSUAL RASH Items with * indicate a potential emergency and should be followed up as soon as possible.  Feel free to call the clinic you have any questions or concerns. The clinic phone number is [phone].  Please show the CHEMO ALERT CARD at check-in to the Emergency Department and triage nurse.   

## 2015-07-17 ENCOUNTER — Ambulatory Visit
Admission: RE | Admit: 2015-07-17 | Discharge: 2015-07-17 | Disposition: A | Discharge status: Home / Self Care | Payer: Self-pay | Source: Ambulatory Visit | Attending: Radiation Oncology | Admitting: Radiation Oncology

## 2015-07-17 ENCOUNTER — Ambulatory Visit: Payer: Self-pay

## 2015-07-17 ENCOUNTER — Encounter: Payer: Self-pay | Admitting: Radiation Oncology

## 2015-07-17 VITALS — BP 105/68 | HR 76 | Temp 98.3°F | Resp 16 | Wt 136.5 lb

## 2015-07-17 DIAGNOSIS — Z79899 Other long term (current) drug therapy: Secondary | ICD-10-CM | POA: Insufficient documentation

## 2015-07-17 DIAGNOSIS — C9000 Multiple myeloma not having achieved remission: Secondary | ICD-10-CM | POA: Insufficient documentation

## 2015-07-17 DIAGNOSIS — Z923 Personal history of irradiation: Secondary | ICD-10-CM | POA: Insufficient documentation

## 2015-07-17 LAB — MULTIPLE MYELOMA PANEL, SERUM
ALBUMIN SERPL ELPH-MCNC: 1.6 g/dL — AB (ref 2.9–4.4)
ALBUMIN/GLOB SERPL: 0.6 — AB (ref 0.7–1.7)
ALPHA 1: 0.6 g/dL — AB (ref 0.0–0.4)
ALPHA2 GLOB SERPL ELPH-MCNC: 1.3 g/dL — AB (ref 0.4–1.0)
B-Globulin SerPl Elph-Mcnc: 0.7 g/dL (ref 0.7–1.3)
Gamma Glob SerPl Elph-Mcnc: 0.5 g/dL (ref 0.4–1.8)
Globulin, Total: 3 g/dL (ref 2.2–3.9)
IGG (IMMUNOGLOBIN G), SERUM: 433 mg/dL — AB (ref 700–1600)
IGM (IMMUNOGLOBIN M), SRM: 19 mg/dL — AB (ref 20–172)
IgA, Qn, Serum: 28 mg/dL — ABNORMAL LOW (ref 90–386)
M Protein SerPl Elph-Mcnc: 0.3 g/dL — ABNORMAL HIGH
Total Protein: 4.6 g/dL — ABNORMAL LOW (ref 6.0–8.5)

## 2015-07-17 NOTE — Progress Notes (Signed)
Weekly rad tx lung 5/10,lung, pt educatuon done with daughter and patient, patient understandas and speaks Enflish,  Doesn't reasd english, very weak, fatigued, dialysis yesterday,  Appetite  Improving a little stated, no nausea or sore throat or pain stated 11:15 AM BP 105/68 mmHg  Pulse 76  Temp(Src) 98.3 F (36.8 C) (Oral)  Resp 16  Wt 136 lb 8 oz (61.916 kg)  SpO2 100% Wt Readings from Last 3 Encounters:  07/17/15 136 lb 8 oz (61.916 kg)  07/15/15 136 lb (61.689 kg)  07/13/15 143 lb 15 oz (65.29 kg)

## 2015-07-17 NOTE — Progress Notes (Signed)
Department of Radiation Oncology  Phone:  [phone] Fax:        [phone]  Weekly Treatment Note    Name: Charles [name] Date: 07/18/2015 MRN: [id number] DOB: [birth year]   Diagnosis:     ICD-9-CM ICD-10-CM   1. Multiple myeloma not having achieved remission ([place]) 203.00 C90.00      Current dose: 12.5 Gy  Current fraction: 5   MEDICATIONS: Current Outpatient Prescriptions  Medication Sig Dispense Refill  . acetaminophen (TYLENOL) 500 MG tablet Take 500 mg by mouth every 6 (six) hours as needed for mild pain, moderate pain, fever or headache.    Marland Kitchen acyclovir (ZOVIRAX) 200 MG capsule Take 2 capsules (400 mg total) by mouth daily. 60 capsule 6  . Amino Acids-Protein Hydrolys (FEEDING SUPPLEMENT, PRO-STAT SUGAR FREE 64,) LIQD Take 30 mLs by mouth 2 (two) times daily.    . calcium acetate (PHOSLO) 667 MG capsule Take 2 capsules (1,334 mg total) by mouth 3 (three) times daily with meals. 120 capsule 0  . Cholecalciferol (VITAMIN D PO) Take 500 Units by mouth daily.    Marland Kitchen dexamethasone (DECADRON) 4 MG tablet Take 5 tablets (6m) on Saturday and Sunday every week (tome 5Adairy el [place] todas las sWiscon. 60 tablet 4  . hydrALAZINE (APRESOLINE) 25 MG tablet Take 1 tablet (25 mg total) by mouth 3 (three) times daily. 90 tablet 0  . metoprolol (LOPRESSOR) 50 MG tablet Take 1 tablet (50 mg total) by mouth 2 (two) times daily. 60 tablet 0  . multivitamin (RENA-VIT) TABS tablet Take 1 tablet by mouth at bedtime. 30 tablet 0  . Nutritional Supplements (FEEDING SUPPLEMENT, NEPRO CARB STEADY,) LIQD Take 237 mLs by mouth 2 (two) times daily between meals.    . polyethylene glycol (MIRALAX / GLYCOLAX) packet Take 17 g by mouth daily. 14 each 0  . prochlorperazine (COMPAZINE) 10 MG tablet Take 1 tablet (10 mg total) by mouth every 6 (six) hours as needed for nausea or vomiting. 60 tablet 6  . senna-docusate (SENOKOT-S) 8.6-50 MG tablet Take 2 tablets by mouth 2 (two)  times daily. 120 tablet 0  . traMADol (ULTRAM) 50 MG tablet Take 1 tablet (50 mg total) by mouth every 6 (six) hours as needed. (Patient taking differently: Take 50 mg by mouth every 6 (six) hours as needed for moderate pain. ) 60 tablet 5  . lenalidomide (REVLIMID) 10 MG capsule Take 1 capsule (10 mg total) by mouth every other day. Or as per Oncology recommendations. (Patient not taking: Reported on 07/17/2015) 45 capsule 3  . mirtazapine (REMERON) 15 MG tablet Take 1 tablet (15 mg total) by mouth at bedtime. (Patient not taking: Reported on 07/17/2015) 30 tablet 0  . morphine (MS CONTIN) 30 MG 12 hr tablet Take 1 tablet (30 mg total) by mouth every 12 (twelve) hours. (Patient not taking: Reported on 07/17/2015) 30 tablet 0   No current facility-administered medications for this encounter.     ALLERGIES: Ciprofloxacin and Mobic   LABORATORY DATA:  Lab Results  Component Value Date   WBC 6.2 07/15/2015   HGB 8.9* 07/15/2015   HCT 27.7* 07/15/2015   MCV 95.2 07/15/2015   PLT 217 07/15/2015   Lab Results  Component Value Date   NA 139 07/15/2015   K 3.8 07/15/2015   CL 98* 07/10/2015   CO2 29 07/15/2015   Lab Results  Component Value Date   ALT 11 07/15/2015   AST 13 07/15/2015   ALKPHOS  168* 07/15/2015   BILITOT 1.78* 07/15/2015     NARRATIVE: Charles [name] was seen today for weekly treatment management. The chart was checked and the patient's films were reviewed.  Weekly rad tx lung 5/10. The pt reports feeling very weak and fatigued. He had dialysis yesterday. His appetite is improving a little. Denies nausea, a sore throat, or pain.  PHYSICAL EXAMINATION: weight is 136 lb 8 oz (61.916 kg). His oral temperature is 98.3 F (36.8 C). His blood pressure is 105/68 and his pulse is 76. His respiration is 16 and oxygen saturation is 100%.   The patient presents to the clinic in a wheelchair. The patient has lost 6 pounds in the last 4 days.  ASSESSMENT: The patient is  doing satisfactorily with treatment.  PLAN: We will continue with the patient's radiation treatment as planned.  ------------------------------------------------  Jodelle Gross, MD, PhD  This document serves as a record of services personally performed by Shona Simpson, PA-C and Kyung Rudd, MD. It was created on their behalf by Darcus Austin, a trained medical scribe. The creation of this record is based on the scribe's personal observations and the providers' statements to them. This document has been checked and approved by the attending provider.

## 2015-07-20 ENCOUNTER — Ambulatory Visit: Payer: Self-pay

## 2015-07-20 ENCOUNTER — Ambulatory Visit
Admission: RE | Admit: 2015-07-20 | Discharge: 2015-07-20 | Disposition: A | Discharge status: Home / Self Care | Payer: Self-pay | Source: Ambulatory Visit | Attending: Radiation Oncology | Admitting: Radiation Oncology

## 2015-07-21 ENCOUNTER — Ambulatory Visit: Payer: Self-pay

## 2015-07-21 ENCOUNTER — Ambulatory Visit
Admission: RE | Admit: 2015-07-21 | Discharge: 2015-07-21 | Disposition: A | Discharge status: Home / Self Care | Payer: Self-pay | Source: Ambulatory Visit | Attending: Radiation Oncology | Admitting: Radiation Oncology

## 2015-07-21 ENCOUNTER — Other Ambulatory Visit: Payer: Self-pay

## 2015-07-21 ENCOUNTER — Ambulatory Visit: Payer: Self-pay | Admitting: Oncology

## 2015-07-21 NOTE — Anesthesia Postprocedure Evaluation (Signed)
Anesthesia Post Note  Patient: Tyion Bensen  Procedure(s) Performed: Procedure(s) (LRB): CREATION OF LEFT UPPER ARM  ARTERIOVENOUS (AV) FISTULA  (Left)  Patient location during evaluation: PACU Anesthesia Type: MAC Level of consciousness: awake and alert Pain management: pain level controlled Vital Signs Assessment: post-procedure vital signs reviewed and stable Respiratory status: spontaneous breathing, nonlabored ventilation, respiratory function stable and patient connected to nasal cannula oxygen Cardiovascular status: stable and blood pressure returned to baseline Anesthetic complications: no    Last Vitals:  Filed Vitals:   07/13/15 1345 07/13/15 1357  BP:  154/82  Pulse: 72 75  Temp: 36.1 C   Resp: 18     Last Pain:  Filed Vitals:   07/13/15 1400  PainSc: 0-No pain                 Raiden Haydu S

## 2015-07-22 ENCOUNTER — Encounter: Payer: Self-pay | Admitting: *Deleted

## 2015-07-22 ENCOUNTER — Ambulatory Visit
Admission: RE | Admit: 2015-07-22 | Discharge: 2015-07-22 | Disposition: A | Discharge status: Home / Self Care | Payer: Self-pay | Source: Ambulatory Visit | Attending: Radiation Oncology | Admitting: Radiation Oncology

## 2015-07-22 ENCOUNTER — Other Ambulatory Visit: Payer: Self-pay

## 2015-07-22 ENCOUNTER — Ambulatory Visit: Payer: Self-pay

## 2015-07-22 NOTE — Progress Notes (Signed)
[place] Work  Clinical Social Work provided wife of pt with hand outs and information re [facility] as a resource to assist children with coping and adjustment to illness.  Wife stated understanding of how to contact [facility] to arrange appointment for her children.   Clinical Social Work interventions: Resource assistance   [name], [place] Worker [place]  [place] Phone: [phone] Fax: [phone]

## 2015-07-23 ENCOUNTER — Ambulatory Visit: Payer: Self-pay

## 2015-07-24 ENCOUNTER — Ambulatory Visit
Admission: RE | Admit: 2015-07-24 | Discharge: 2015-07-24 | Disposition: A | Discharge status: Home / Self Care | Payer: Self-pay | Source: Ambulatory Visit | Attending: Radiation Oncology | Admitting: Radiation Oncology

## 2015-07-24 ENCOUNTER — Ambulatory Visit: Admission: RE | Admit: 2015-07-24 | Payer: MEDICAID | Source: Ambulatory Visit | Admitting: Radiation Oncology

## 2015-07-24 ENCOUNTER — Ambulatory Visit: Payer: Self-pay

## 2015-07-27 ENCOUNTER — Ambulatory Visit
Admission: RE | Admit: 2015-07-27 | Discharge: 2015-07-27 | Disposition: A | Discharge status: Home / Self Care | Payer: MEDICAID | Source: Ambulatory Visit | Attending: Radiation Oncology | Admitting: Radiation Oncology

## 2015-07-27 ENCOUNTER — Ambulatory Visit: Payer: Self-pay

## 2015-07-27 ENCOUNTER — Encounter: Payer: Self-pay | Admitting: Radiation Oncology

## 2015-07-27 ENCOUNTER — Ambulatory Visit
Admission: RE | Admit: 2015-07-27 | Discharge: 2015-07-27 | Disposition: A | Discharge status: Home / Self Care | Payer: Self-pay | Source: Ambulatory Visit | Attending: Radiation Oncology | Admitting: Radiation Oncology

## 2015-07-27 VITALS — BP 104/75 | HR 80 | Temp 98.2°F | Resp 16 | Ht 61.0 in | Wt 131.1 lb

## 2015-07-27 DIAGNOSIS — C9002 Multiple myeloma in relapse: Secondary | ICD-10-CM

## 2015-07-27 NOTE — Progress Notes (Signed)
Department of Radiation Oncology  Phone:  [phone] Fax:        [phone]  Weekly Treatment Note    Name: Charles [name] Date: 07/27/2015 MRN: [id number] DOB: May 19, 1975   Diagnosis:     ICD-9-CM ICD-10-CM   1. Multiple myeloma in relapse ([place]) 203.02 C90.02 Amb Referral to Survivorship Program     Current dose: 25 Gy  Current fraction: 10   MEDICATIONS: Current Outpatient Prescriptions  Medication Sig Dispense Refill  . acyclovir (ZOVIRAX) 200 MG capsule Take 2 capsules (400 mg total) by mouth daily. 60 capsule 6  . Amino Acids-Protein Hydrolys (FEEDING SUPPLEMENT, PRO-STAT SUGAR FREE 64,) LIQD Take 30 mLs by mouth 2 (two) times daily.    . calcium acetate (PHOSLO) 667 MG capsule Take 2 capsules (1,334 mg total) by mouth 3 (three) times daily with meals. 120 capsule 0  . Cholecalciferol (VITAMIN D PO) Take 500 Units by mouth daily.    Marland Kitchen dexamethasone (DECADRON) 4 MG tablet Take 5 tablets (21m) on Saturday and Sunday every week ([place]. 60 tablet 4  . hydrALAZINE (APRESOLINE) 25 MG tablet Take 1 tablet (25 mg total) by mouth 3 (three) times daily. 90 tablet 0  . metoprolol (LOPRESSOR) 50 MG tablet Take 1 tablet (50 mg total) by mouth 2 (two) times daily. 60 tablet 0  . multivitamin (RENA-VIT) TABS tablet Take 1 tablet by mouth at bedtime. 30 tablet 0  . Nutritional Supplements (FEEDING SUPPLEMENT, NEPRO CARB STEADY,) LIQD Take 237 mLs by mouth 2 (two) times daily between meals.    . traMADol (ULTRAM) 50 MG tablet Take 1 tablet (50 mg total) by mouth every 6 (six) hours as needed. (Patient taking differently: Take 50 mg by mouth every 6 (six) hours as needed for moderate pain. ) 60 tablet 5  . acetaminophen (TYLENOL) 500 MG tablet Take 500 mg by mouth every 6 (six) hours as needed for mild pain, moderate pain, fever or headache. Reported on 07/27/2015    . lenalidomide (REVLIMID) 10 MG capsule Take 1 capsule (10 mg  total) by mouth every other day. Or as per Oncology recommendations. (Patient not taking: Reported on 07/17/2015) 45 capsule 3  . mirtazapine (REMERON) 15 MG tablet Take 1 tablet (15 mg total) by mouth at bedtime. (Patient not taking: Reported on 07/17/2015) 30 tablet 0  . morphine (MS CONTIN) 30 MG 12 hr tablet Take 1 tablet (30 mg total) by mouth every 12 (twelve) hours. (Patient not taking: Reported on 07/17/2015) 30 tablet 0  . polyethylene glycol (MIRALAX / GLYCOLAX) packet Take 17 g by mouth daily. (Patient not taking: Reported on 07/27/2015) 14 each 0  . prochlorperazine (COMPAZINE) 10 MG tablet Take 1 tablet (10 mg total) by mouth every 6 (six) hours as needed for nausea or vomiting. 60 tablet 6  . senna-docusate (SENOKOT-S) 8.6-50 MG tablet Take 2 tablets by mouth 2 (two) times daily. (Patient not taking: Reported on 07/27/2015) 120 tablet 0   No current facility-administered medications for this encounter.     ALLERGIES: Ciprofloxacin and Mobic   LABORATORY DATA:  Lab Results  Component Value Date   WBC 6.2 07/15/2015   HGB 8.9* 07/15/2015   HCT 27.7* 07/15/2015   MCV 95.2 07/15/2015   PLT 217 07/15/2015   Lab Results  Component Value Date   NA 139 07/15/2015   K 3.8 07/15/2015   CL 98* 07/10/2015   CO2 29 07/15/2015   Lab Results  Component Value Date   ALT 11 07/15/2015   AST 13 07/15/2015   ALKPHOS 168* 07/15/2015   BILITOT 1.78* 07/15/2015     NARRATIVE: Charles [name] was seen today for weekly treatment management. The chart was checked and the patient's films were reviewed.  Weekly rad tx lung 10,lung, pt educatuon done for EOT with daughter and patient, patient understands and speaks [place], Doesn't reasd english, not as Weak the past few days, fatigue during the day,last dialysis was Saturday, Appetite is a lot better over the past week, having some Nausea taking Compazine. Denies sore throat or swallowing problems taking Decadron and denies pain today, has  Tramadol. Skin normal to chest. Denies pain in chest   PHYSICAL EXAMINATION: height is 5' 1"  (1.549 m) and weight is 131 lb 1.6 oz (59.467 kg). His oral temperature is 98.2 F (36.8 C). His blood pressure is 104/75 and his pulse is 80. His respiration is 16 and oxygen saturation is 100%.   Patient is alert and oriented x4 and appropriate throughout the examination. Cardiopulmonary assessment is negative for acute distress and exhibits normal effort.   ASSESSMENT: The patient has completed radiation treatments.   PLAN: The patient is scheduled for 1 month follow up.   ------------------------------------------------  Jodelle Gross, MD, PhD  This document serves as a record of services personally performed by Kyung Rudd, MD. It was created on his behalf by Derek Mound, a trained medical scribe. The creation of this record is based on the scribe's personal observations and the provider's statements to them. This document has been checked and approved by the attending provider.

## 2015-07-27 NOTE — Progress Notes (Addendum)
Weekly rad tx lung 10,lung, pt educatuon done for EOT with daughter and patient, patient understandas and speaks [place], Doesn't reasd english, not as  Weak the past few days, fatigue during the day,last dialysis was Saturday, Appetite is a lot better over the past week, having some  Nausea taking Compazine.  Denies sore throat or swallowing problems taking Decadron and denies pain today, has Tramadol.  Skin normal to chest. Wt Readings from Last 3 Encounters:  07/27/15 131 lb 1.6 oz (59.467 kg)  07/17/15 136 lb 8 oz (61.916 kg)  07/15/15 136 lb (61.689 kg)  BP 104/75 mmHg  Pulse 80  Temp(Src) 98.2 F (36.8 C) (Oral)  Resp 16  Ht '5\' 1"'$  (1.549 m)  Wt 131 lb 1.6 oz (59.467 kg)  BMI 24.78 kg/m2  SpO2 100%

## 2015-07-28 NOTE — Progress Notes (Signed)
Here his progress   [place]  [phone] [phone]    ID: Charles [name] DOB: 12-25-1975  MR#: [id number]  IRS#:[id number]  Patient Care Team: Tresa Garter, MD as PCP - General (Internal Medicine) [name], NP as Nurse Practitioner (Hematology and Oncology) [name], MD as Consulting Physician (Oncology) PCP: [name], MD GYN: SU:  OTHER MD:  CHIEF COMPLAINT: multiple myeloma  CURRENT TREATMENT:   Denosumab/Xgeva; carfilzomib, cyclophosphamide, dexamethasone  HISTORY  OF MULTIPLE MYELOMA: From the original intake note January 2016:  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 The pain was worse with coughing. Exam was unremarkable, and he was treated with Mobic. No labs were obtained. On 01/30/2014 he presented to the emergency room at [facility] with similar complaints and also reported a 10 pound weight loss over the past month. A chest x-ray was significant only for mild atelectasis, but ACT NGO of the chest 01/30/2014, while it showed no pulmonary emboli, showed an osteolytic lesion destroying the manubrium of the sternum. There was extra osseous extension into the anterior mediastinum.  The patient was admitted and found to have a creatinine of 1.64 with a GFR of 51. Bilateral renal ultrasound showed no hydronephrosis or masses, and normal size and cortical thickness of both kidneys, though there was mild diffuse echogenicity. Urine total protein was 145 mg/dL and immunofixation showed a monoclonal IgG kappa protein as well as monoclonal free kappa light chains. On 01/31/2014 serum protein electrophoresis showed an M spike of 2.22 g/dL with a total protein of 8.3 and albumin 3.4. Kappa lambda light chains from the serum obtained 02/04/2014 showed 10.50 mg/dL on the, 1.74  in the lambda, with an elevated ratio at 6.03. Beta-2 microglobulin was 7.36. LDH was normal at 184 and HIV antibody was nonreactive.  Biopsy of the manubrial mass 02/03/2014 showed extensive infiltration of the bone by sheets of atypical plasma cells, positive for CD138, kappa restricted (SZA 16-23). Right iliac bone marrow biopsy Charles [name] 05/21/2014 (FZB 16-1) showed an average of 12% plasma cells with numerous aggregates and sheets of atypical plasma cells, which were kappa restricted. Cytogenetic analysis is pending  The patient's subsequent history is as detailed below  INTERVAL HISTORY: Charles [name] returns today for follow-up of his advanced multiple myeloma. Since his last visit here he had a prolonged admission during which he was started on hemodialysis. He has a fistula in the left upper arm. He is treated on Tuesdays, Thursdays, and Saturdays. He continues on carfilzomib, which he appears to be tolerating well. If he receives carfilzomib on a hemodialysis day he receives it after hemodialysis. Labs show a significant reduction in his monoclonal protein, and a smaller reduction in his light chains. He continues on lenalidomide every other day, as well as carfilzomib and he receives 20 mg of dexamethasone on Sundays and Mondays.  REVIEW OF SYSTEMS: Charles [name] feels weak. On hemodialysis days there is little that he can do except rest. On the "off" days he feels a little bit more normal but still rarely leaves the house at this point. His pain is greatly decreased and he is currently off MS Contin, using Ultram as needed. He is not constipated from these medications. There have been no unusual headaches, visual changes, nausea, or vomiting a detailed review of systems today was otherwise stable  PAST MEDICAL  HISTORY: Past Medical History  Diagnosis Date  . IgG myeloma ([place])   . Hypercholesterolemia   . Lytic bone lesions on xray   . CKD (chronic kidney disease), stage III     [facility] [name] [facility]  .  Hypertension   . GERD (gastroesophageal reflux disease)   . Headache     occasional  . Neuromuscular disorder ([place])     states both arms are shaking a lot    PAST SURGICAL HISTORY: Past Surgical History  Procedure Laterality Date  . Insertion of dialysis catheter Right   . Av fistula placement Left 07/13/2015    Procedure: CREATION OF LEFT UPPER ARM  ARTERIOVENOUS (AV) FISTULA ;  Surgeon: Elam Dutch, MD;  Location: [facility] OR;  Service: Vascular;  Laterality: Left;    FAMILY HISTORY Family History  Problem Relation Age of Onset  . Diabetes Father    The patient's parents are still living, in their late 35's. The patient has 2 brothers, 3 sisters. There is noc cancer in the fmaily to his knowledge.  SOCIAL HISTORY:  Originally from [facility], moved here 19 years ago. He works in Nurse, adult. Married (wife's name is [name]), lives in [place]. He has 5 children ages 29, 65, 24, 78 and 78 months Charles [name], Charles [name], Charles [name] and Charles [name]) in good health. Best number to call him is [phone]    ADVANCED DIRECTIVES: not  In place   HEALTH MAINTENANCE: Social History  Substance Use Topics  . Smoking status: Never Smoker   . Smokeless tobacco: Never Used  . Alcohol Use: No     Colonoscopy:  PSA:  Bone density:  Lipid panel:  Allergies  Allergen Reactions  . Ciprofloxacin Rash  . Mobic [Meloxicam] Rash    Current Outpatient Prescriptions  Medication Sig Dispense Refill  . acyclovir (ZOVIRAX) 200 MG capsule Take 2 capsules (400 mg total) by mouth daily. 60 capsule 6  . Amino Acids-Protein Hydrolys (FEEDING SUPPLEMENT, PRO-STAT SUGAR FREE 64,) LIQD Take 30 mLs by mouth 2 (two) times daily.    . calcium acetate (PHOSLO) 667 MG capsule Take 2 capsules (1,334 mg total) by mouth 3 (three) times daily with meals. 120 capsule 0  . Cholecalciferol (VITAMIN D PO) Take 500 Units by mouth daily.    Marland Kitchen dexamethasone (DECADRON) 4 MG tablet Take 5 tablets  (50m) on Saturday and Sunday every week (t[place]. 60 tablet 4  . hydrALAZINE (APRESOLINE) 25 MG tablet Take 1 tablet (25 mg total) by mouth 3 (three) times daily. 90 tablet 0  . metoprolol (LOPRESSOR) 50 MG tablet Take 1 tablet (50 mg total) by mouth 2 (two) times daily. 60 tablet 0  . mirtazapine (REMERON) 15 MG tablet Take 1 tablet (15 mg total) by mouth at bedtime. (Patient not taking: Reported on 07/17/2015) 30 tablet 0  . multivitamin (RENA-VIT) TABS tablet Take 1 tablet by mouth at bedtime. 30 tablet 0  . Nutritional Supplements (FEEDING SUPPLEMENT, NEPRO CARB STEADY,) LIQD Take 237 mLs by mouth 2 (two) times daily between meals.    . polyethylene glycol (MIRALAX / GLYCOLAX) packet Take 17 g by mouth daily. (Patient not taking: Reported on 07/27/2015) 14 each 0  . prochlorperazine (COMPAZINE) 10 MG tablet Take 1 tablet (10 mg total) by mouth every 6 (six) hours as needed for nausea or vomiting. 60 tablet 6  . senna-docusate (SENOKOT-S) 8.6-50 MG tablet Take 2 tablets by mouth 2 (two)  times daily. (Patient not taking: Reported on 07/27/2015) 120 tablet 0  . traMADol (ULTRAM) 50 MG tablet Take 1 tablet (50 mg total) by mouth every 6 (six) hours as needed. (Patient taking differently: Take 50 mg by mouth every 6 (six) hours as needed for moderate pain. ) 60 tablet 5   No current facility-administered medications for this visit.    OBJECTIVE: Young Latin manWho appears fatigued Filed Vitals:   07/29/15 1158  BP: 105/75  Pulse: 78  Temp: 98.1 F (36.7 C)  Resp: 18     Body mass index is 24.9 kg/(m^2).    ECOG FS:2 - Symptomatic, <50% confined to bed   Sclerae unicteric, pupils round and equal Oropharynx clear and moist-- no thrush or other lesions No cervical or supraclavicular adenopathy Lungs no rales or rhonchi Heart regular rate and rhythm Abd soft, nontender, positive bowel sounds MSK no focal spinal tenderness, no upper extremity  lymphedema, good thrill left upper arm Neuro: nonfocal, well oriented, appropriate affect  LABS:  Kappa/Lambda FluidC Ratio 0.26 - 1.65  8.01 (H) 29.83 (H) 13.72 (H) 6.83 (H)        M Protein SerPl Elph-Mcnc Not Observed g/dL 0.3 (H)  1.7 (H) 1.8 (H)            CMP     Component Value Date/Time   NA 141 07/29/2015 1135   NA 133* 07/13/2015 1041   K 3.7 07/29/2015 1135   K 4.5 07/13/2015 1041   CL 98* 07/10/2015 0727   CO2 32* 07/29/2015 1135   CO2 25 07/10/2015 0727   GLUCOSE 89 07/29/2015 1135   GLUCOSE 98 07/13/2015 1041   BUN 26.2* 07/29/2015 1135   BUN 84* 07/10/2015 0727   CREATININE 4.0* 07/29/2015 1135   CREATININE 7.26* 07/10/2015 0727   CALCIUM 7.8* 07/29/2015 1135   CALCIUM 7.1* 07/10/2015 0727   PROT 6.0* 07/29/2015 1135   PROT 4.6* 07/15/2015 1105   PROT 5.0* 07/07/2015 1815   ALBUMIN 1.8* 07/29/2015 1135   ALBUMIN 1.2* 07/10/2015 0727   AST 13 07/29/2015 1135   AST 16 07/07/2015 1815   ALT 11 07/29/2015 1135   ALT 14* 07/07/2015 1815   ALKPHOS 157* 07/29/2015 1135   ALKPHOS 203* 07/07/2015 1815   BILITOT 0.50 07/29/2015 1135   BILITOT 1.2 07/07/2015 1815   GFRNONAA 8* 07/10/2015 0727   GFRAA 10* 07/10/2015 0727    INo results found for: SPEP, UPEP  Lab Results  Component Value Date   WBC 6.9 07/29/2015   NEUTROABS 5.4 07/29/2015   HGB 7.4* 07/29/2015   HCT 23.2* 07/29/2015   MCV 96.6 07/29/2015   PLT 417* 07/29/2015      Chemistry      Component Value Date/Time   NA 141 07/29/2015 1135   NA 133* 07/13/2015 1041   K 3.7 07/29/2015 1135   K 4.5 07/13/2015 1041   CL 98* 07/10/2015 0727   CO2 32* 07/29/2015 1135   CO2 25 07/10/2015 0727   BUN 26.2* 07/29/2015 1135   BUN 84* 07/10/2015 0727   CREATININE 4.0* 07/29/2015 1135   CREATININE 7.26* 07/10/2015 0727      Component Value Date/Time   CALCIUM 7.8* 07/29/2015 1135   CALCIUM 7.1* 07/10/2015 0727   ALKPHOS 157* 07/29/2015 1135   ALKPHOS 203* 07/07/2015 1815   AST 13  07/29/2015 1135   AST 16 07/07/2015 1815   ALT 11 07/29/2015 1135   ALT 14* 07/07/2015 1815   BILITOT 0.50 07/29/2015 1135  BILITOT 1.2 07/07/2015 1815       No results found for: LABCA2  No components found for: [id number]  No results for input(s): INR in the last 168 hours.  Urinalysis    Component Value Date/Time   COLORURINE BROWN* 07/07/2015 1908   APPEARANCEUR CLOUDY* 07/07/2015 1908   LABSPEC 1.014 07/07/2015 1908   LABSPEC 1.030 01/22/2015 1018   PHURINE 7.0 07/07/2015 1908   PHURINE 5.0 01/22/2015 1018   GLUCOSEU NEGATIVE 07/07/2015 1908   GLUCOSEU Negative 01/22/2015 1018   HGBUR LARGE* 07/07/2015 1908   HGBUR Large 01/22/2015 1018   BILIRUBINUR LARGE* 07/07/2015 1908   BILIRUBINUR Negative 01/22/2015 1018   KETONESUR 15* 07/07/2015 1908   KETONESUR Negative 01/22/2015 1018   PROTEINUR >300* 07/07/2015 1908   PROTEINUR 2000 01/22/2015 1018   UROBILINOGEN 0.2 01/22/2015 1018   UROBILINOGEN 0.2 01/31/2014 1050   NITRITE POSITIVE* 07/07/2015 1908   NITRITE Negative 01/22/2015 1018   LEUKOCYTESUR MODERATE* 07/07/2015 1908   LEUKOCYTESUR Color Interference due to blood in urine 01/22/2015 1018    STUDIES: Ct Abdomen Pelvis Wo Contrast  06/30/2015  CLINICAL DATA:  History of multiple myeloma with chronic kidney disease stage 3 and back pain currently undergoing chemotherapy. Nausea, vomiting, diarrhea, fever and chills four days. EXAM: CT CHEST, ABDOMEN AND PELVIS WITHOUT CONTRAST TECHNIQUE: Multidetector CT imaging of the chest, abdomen and pelvis was performed following the standard protocol without IV contrast. COMPARISON:  CT 06/23/2015, 02/03/2014 and 01/30/2014 FINDINGS: CT CHEST Lungs are adequately inflated demonstrate a small amount of bilateral pleural fluid left greater than right with associated atelectasis in the posterior lung bases left greater than right. There are new multiple bilateral well-defined focal pleural masses right greater than left with the  largest over the right apex measuring 2.8 x 5.2 cm. These likely represent spread of patient's known multiple myeloma. Airways are normal. Heart is normal size. No definite mediastinal or hilar adenopathy. Small amount contrast within the mid to distal esophagus likely due to dysmotility versus reflux. Remaining mediastinal structures are unremarkable. There are couple hypodense thyroid nodules with the largest measuring 1.2 cm over the left lobe. Patient's known destructive sternal manubrial lesion is unchanged. There is subtle increased lucency over the body of the sternum which is new likely spread of patient's known myeloma. There are several lucencies over the thoracic spine with the most prominent over the T9 vertebral body. CT ABDOMEN AND PELVIS The liver, spleen, gallbladder, pancreas and adrenal glands are within normal. Kidneys normal size without hydronephrosis or nephrolithiasis. Stable exophytic hypodensity over the mid pole cortex of the left kidney likely a cyst. Ureters are normal. Stomach is within normal. Small bowel is normal. Appendix is normal. Subtle diverticulosis of the colon most prominent over the right colon. There is no significant free fluid or focal inflammatory change. Few small periaortic lymph nodes. Vascular structures are unremarkable. Pelvic images demonstrate the bladder, prostate and rectum to be within normal. Ill-defined low-attenuation over the right iliac bone likely a myelomatous lesion. Ill-defined lytic process over the right femoral neck spanning nearly the entire width of the femoral neck increasing risk of pathologic fracture. Bone island over the left sacrum. IMPRESSION: No acute findings in the chest, abdomen or pelvis. Lytic lesions within the sternum, spine, right iliac bone and right femoral neck compatible with patient's known myeloma. Note that patient is at risk for pathologic fracture through the right femoral neck. Multiple new bilateral focal pleural masses  with the largest over the right apex measuring  2.8 x 5.2 cm likely metastatic disease from patient's multiple myeloma. Small amount of bilateral pleural fluid bibasilar consolidation most typical of atelectasis. Minimal diverticulosis of the colon. Stable 1.3 cm exophytic hypodensity over the mid pole left kidney likely a cyst. Couple small hypodense thyroid nodules with the larger over the left lobe measuring 1.2 cm. Recommend follow-up as clinically indicated. Electronically Signed   By: Marin Olp M.D.   On: 06/30/2015 17:24   Dg Chest 2 View  07/07/2015  CLINICAL DATA:  Fever and leukocytosis EXAM: CHEST  2 VIEW COMPARISON:  07/06/2015 FINDINGS: Cardiac shadow is stable. Dialysis catheter is again seen. The inspiratory effort is improved with improvement of the basilar atelectasis. There remains soft tissue dense along the right chest wall consistent with the given clinical history. IMPRESSION: Slightly improved aeration with resolution of previously seen atelectasis Electronically Signed   By: Inez Catalina M.D.   On: 07/07/2015 19:05   Ct Chest Wo Contrast  06/30/2015  CLINICAL DATA:  History of multiple myeloma with chronic kidney disease stage 3 and back pain currently undergoing chemotherapy. Nausea, vomiting, diarrhea, fever and chills four days. EXAM: CT CHEST, ABDOMEN AND PELVIS WITHOUT CONTRAST TECHNIQUE: Multidetector CT imaging of the chest, abdomen and pelvis was performed following the standard protocol without IV contrast. COMPARISON:  CT 06/23/2015, 02/03/2014 and 01/30/2014 FINDINGS: CT CHEST Lungs are adequately inflated demonstrate a small amount of bilateral pleural fluid left greater than right with associated atelectasis in the posterior lung bases left greater than right. There are new multiple bilateral well-defined focal pleural masses right greater than left with the largest over the right apex measuring 2.8 x 5.2 cm. These likely represent spread of patient's known multiple  myeloma. Airways are normal. Heart is normal size. No definite mediastinal or hilar adenopathy. Small amount contrast within the mid to distal esophagus likely due to dysmotility versus reflux. Remaining mediastinal structures are unremarkable. There are couple hypodense thyroid nodules with the largest measuring 1.2 cm over the left lobe. Patient's known destructive sternal manubrial lesion is unchanged. There is subtle increased lucency over the body of the sternum which is new likely spread of patient's known myeloma. There are several lucencies over the thoracic spine with the most prominent over the T9 vertebral body. CT ABDOMEN AND PELVIS The liver, spleen, gallbladder, pancreas and adrenal glands are within normal. Kidneys normal size without hydronephrosis or nephrolithiasis. Stable exophytic hypodensity over the mid pole cortex of the left kidney likely a cyst. Ureters are normal. Stomach is within normal. Small bowel is normal. Appendix is normal. Subtle diverticulosis of the colon most prominent over the right colon. There is no significant free fluid or focal inflammatory change. Few small periaortic lymph nodes. Vascular structures are unremarkable. Pelvic images demonstrate the bladder, prostate and rectum to be within normal. Ill-defined low-attenuation over the right iliac bone likely a myelomatous lesion. Ill-defined lytic process over the right femoral neck spanning nearly the entire width of the femoral neck increasing risk of pathologic fracture. Bone island over the left sacrum. IMPRESSION: No acute findings in the chest, abdomen or pelvis. Lytic lesions within the sternum, spine, right iliac bone and right femoral neck compatible with patient's known myeloma. Note that patient is at risk for pathologic fracture through the right femoral neck. Multiple new bilateral focal pleural masses with the largest over the right apex measuring 2.8 x 5.2 cm likely metastatic disease from patient's multiple  myeloma. Small amount of bilateral pleural fluid bibasilar consolidation most typical  of atelectasis. Minimal diverticulosis of the colon. Stable 1.3 cm exophytic hypodensity over the mid pole left kidney likely a cyst. Couple small hypodense thyroid nodules with the larger over the left lobe measuring 1.2 cm. Recommend follow-up as clinically indicated. Electronically Signed   By: Marin Olp M.D.   On: 06/30/2015 17:24   Mr Brain Wo Contrast  07/06/2015  CLINICAL DATA:  Multiple myeloma undergoing chemotherapy. Severe nausea and vomiting. Posterior reversible encephalopathy. Follow-up. EXAM: MRI HEAD WITHOUT CONTRAST TECHNIQUE: Multiplanar, multiecho pulse sequences of the brain and surrounding structures were obtained without intravenous contrast. COMPARISON:  06/26/2015 FINDINGS: Diffusion imaging does not show any acute or subacute infarction. There is resolving cortical and subcortical edema of the brain bilaterally in a posterior distribution. No evidence of hemorrhagic transformation. No evidence of hydrocephalus or extra-axial collection. Mucosal thickening and a small amount of fluid in the right maxillary sinus again noted. Multiple calvarial lesions unchanged since the previous study. IMPRESSION: Cortical and subcortical edema primarily affecting the posterior brain, improving when compared to the study of 06/26/2015, as expected with successful treatment/resolution of posterior reversible encephalopathy. No evidence of infarctions or hemorrhagic change. Multiple myeloma skull lesions as seen previously. Electronically Signed   By: Nelson Chimes M.D.   On: 07/06/2015 20:16   Mr Cervical Spine Wo Contrast  07/09/2015  CLINICAL DATA:  Initial evaluation for right arm weakness. EXAM: MRI CERVICAL SPINE WITHOUT CONTRAST TECHNIQUE: Multiplanar, multisequence MR imaging of the cervical spine was performed. No intravenous contrast was administered. COMPARISON:  None. FINDINGS: Alignment: Ir to row bodies  normally aligned with preservation of the normal cervical lordosis. No listhesis or malalignment. Vertebrae: Diffusely abnormal bone marrow with multiple osseous lesions present, consistent with known multiple myeloma. No extra osseous extension of tumor. No epidural tumor. No pathologic fracture. Cord: Signal intensity within the cervical spinal cord is normal. Posterior Fossa, vertebral arteries, paraspinal tissues: Visualized posterior fossa a grossly unremarkable. Changes related to known pressed are not well appreciated on this exam. Craniocervical junction normal. Paraspinous soft tissues within normal limits. No prevertebral or paravertebral edema. Normal intravascular flow voids present within the vertebral arteries bilaterally. Disc levels: C2-C3: Negative. C3-C4: Mild bilateral uncovertebral hypertrophy, left slightly worse than right. Mild left foraminal stenosis. No canal or right foraminal narrowing. C4-C5: Mild degenerative disc bulge with bilateral uncovertebral hypertrophy. No significant canal stenosis. Minimal bilateral foraminal narrowing. C5-C6:  Minimal uncovertebral hypertrophy.  No stenosis. C6-C7:  Negative. C7-T1:  Negative. IMPRESSION: 1. Diffusely abnormal bone marrow, consistent with known history of multiple myeloma. No extraosseous or epidural tumor. No pathologic fracture or other complication. 2. Relatively mild multilevel degenerative spondylolysis as above. No significant canal or foraminal stenosis. No findings to explain right upper extremity symptoms. Electronically Signed   By: Jeannine Boga M.D.   On: 07/09/2015 01:48   Mr Thoracic Spine Wo Contrast  07/02/2015  CLINICAL DATA:  Multiple myeloma.  Back pain.  Fever EXAM: MRI THORACIC AND LUMBAR SPINE WITHOUT CONTRAST TECHNIQUE: Multiplanar and multiecho pulse sequences of the thoracic and lumbar spine were obtained without intravenous contrast. COMPARISON:  CT chest abdomen and pelvis 06/30/2015 FINDINGS: MRI THORACIC  SPINE FINDINGS Alignment:  Normal Vertebrae: Diffuse infiltration of the bone marrow throughout all vertebral bodies consistent with myeloma. This also involves the posterior elements and ribs bilaterally. There is extradural tumor in the spinal canal posteriorly at T4, T7, and T 10-11. Extraosseous mass lesions in the ribs bilaterally consistent with myeloma. Cord: Posterior extradural mass at the T4  level causing mild spinal stenosis. Posterior extradural mass at the T7 level causing moderate spinal stenosis and mild compression of the cord. Cord has normal signal. Mild posterior extradural tumor at T10-11 extending to the right. This is causing right foraminal stenosis. No significant spinal stenosis. Paraspinal and other soft tissues: Paraspinous muscles normal. Small bilateral pleural effusions. Diffuse rib infiltration by mall myeloma with multiple extradural rib lesions. Disc levels: Negative for disc protrusion. No significant disc degeneration. MRI LUMBAR SPINE FINDINGS Segmentation:  Normal.  Lowest disc space L5-S1 Alignment:  Normal Vertebrae: Diffuse infiltration of the bone marrow by myeloma. This involves all vertebral bodies and extends into the posterior elements. There is involvement of the pelvis. Sacrum is diffusely involved. Negative for fracture. Conus medullaris: Extends to the L1 level and appears normal. Paraspinal and other soft tissues: No retroperitoneal adenopathy. Extraosseous mass from the left iliac wing displacing iliopsoas anteriorly Disc levels: L1-2:  Negative L2-3:  Negative L3-4:  Bilateral facet degeneration.  Normal disc space L4-5:  Bilateral facet degeneration.  Normal disc space L5-S1: Small central disc protrusion without neural impingement. IMPRESSION: MR THORACIC SPINE IMPRESSION Extensive and diffuse infiltration of the bone marrow with myeloma. No fracture. Posterior extradural mass the T4 level causing mild spinal stenosis. Posterior epidural mass at the T7 level  causing moderate spinal stenosis and mild compression of the cord. MR LUMBAR SPINE IMPRESSION Diffuse bone marrow infiltration throughout the lumbar spine, sacrum, and pelvis. No fracture. No tumor is seen in the lumbar spinal canal. No evidence of infection in the thoracic or lumbar spine. Electronically Signed   By: Franchot Gallo M.D.   On: 07/02/2015 09:00   Mr Lumbar Spine Wo Contrast  07/02/2015  CLINICAL DATA:  Multiple myeloma.  Back pain.  Fever EXAM: MRI THORACIC AND LUMBAR SPINE WITHOUT CONTRAST TECHNIQUE: Multiplanar and multiecho pulse sequences of the thoracic and lumbar spine were obtained without intravenous contrast. COMPARISON:  CT chest abdomen and pelvis 06/30/2015 FINDINGS: MRI THORACIC SPINE FINDINGS Alignment:  Normal Vertebrae: Diffuse infiltration of the bone marrow throughout all vertebral bodies consistent with myeloma. This also involves the posterior elements and ribs bilaterally. There is extradural tumor in the spinal canal posteriorly at T4, T7, and T 10-11. Extraosseous mass lesions in the ribs bilaterally consistent with myeloma. Cord: Posterior extradural mass at the T4 level causing mild spinal stenosis. Posterior extradural mass at the T7 level causing moderate spinal stenosis and mild compression of the cord. Cord has normal signal. Mild posterior extradural tumor at T10-11 extending to the right. This is causing right foraminal stenosis. No significant spinal stenosis. Paraspinal and other soft tissues: Paraspinous muscles normal. Small bilateral pleural effusions. Diffuse rib infiltration by mall myeloma with multiple extradural rib lesions. Disc levels: Negative for disc protrusion. No significant disc degeneration. MRI LUMBAR SPINE FINDINGS Segmentation:  Normal.  Lowest disc space L5-S1 Alignment:  Normal Vertebrae: Diffuse infiltration of the bone marrow by myeloma. This involves all vertebral bodies and extends into the posterior elements. There is involvement of the  pelvis. Sacrum is diffusely involved. Negative for fracture. Conus medullaris: Extends to the L1 level and appears normal. Paraspinal and other soft tissues: No retroperitoneal adenopathy. Extraosseous mass from the left iliac wing displacing iliopsoas anteriorly Disc levels: L1-2:  Negative L2-3:  Negative L3-4:  Bilateral facet degeneration.  Normal disc space L4-5:  Bilateral facet degeneration.  Normal disc space L5-S1: Small central disc protrusion without neural impingement. IMPRESSION: MR THORACIC SPINE IMPRESSION Extensive and diffuse infiltration of  the bone marrow with myeloma. No fracture. Posterior extradural mass the T4 level causing mild spinal stenosis. Posterior epidural mass at the T7 level causing moderate spinal stenosis and mild compression of the cord. MR LUMBAR SPINE IMPRESSION Diffuse bone marrow infiltration throughout the lumbar spine, sacrum, and pelvis. No fracture. No tumor is seen in the lumbar spinal canal. No evidence of infection in the thoracic or lumbar spine. Electronically Signed   By: Franchot Gallo M.D.   On: 07/02/2015 09:00   Ir Fluoro Guide Cv Line Right  07/03/2015  INDICATION: 40 year old male with a history of renal failure. He has been referred for hemodialysis catheter placement. EXAM: TUNNELED CENTRAL VENOUS HEMODIALYSIS CATHETER PLACEMENT WITH ULTRASOUND AND FLUOROSCOPIC GUIDANCE MEDICATIONS: 2.0 g Ancef . The antibiotic was given in an appropriate time interval prior to skin puncture. ANESTHESIA/SEDATION: Moderate (conscious) sedation was employed during this procedure. A total of Versed 1.5 mg and Fentanyl 25 mcg was administered intravenously. Moderate Sedation Time: 15 minutes. The patient's level of consciousness and vital signs were monitored continuously by radiology nursing throughout the procedure under my direct supervision. FLUOROSCOPY TIME:  Fluoroscopy Time: 0 minutes 6 seconds (2 mGy). COMPLICATIONS: None PROCEDURE: Informed written consent was obtained  from the patient after a discussion of the risks, benefits, and alternatives to treatment. Questions regarding the procedure were encouraged and answered. The right neck and chest were prepped with chlorhexidine in a sterile fashion, and a sterile drape was applied covering the operative field. Maximum barrier sterile technique with sterile gowns and gloves were used for the procedure. A timeout was performed prior to the initiation of the procedure. After creating a small venotomy incision, a micropuncture kit was utilized to access the right internal jugular vein under direct, real-time ultrasound guidance after the overlying soft tissues were anesthetized with 1% lidocaine with epinephrine. Ultrasound image documentation was performed. The microwire was marked to measure appropriate internal catheter length. External tunneled length was estimated. A total tip to cuff length of 19 cm was selected. Skin and subcutaneous tissues of chest wall below the clavicle were generously infiltrated with 1% lidocaine for local anesthesia. A small stab incision was made with 11 blade scalpel. The selected hemodialysis catheter was tunneled in a retrograde fashion from the anterior chest wall to the venotomy incision. A guidewire was advanced to the level of the IVC and the micropuncture sheath was exchanged for a peel-away sheath. The catheter was then placed through the peel-away sheath with tips ultimately positioned within the superior aspect of the right atrium. Final catheter positioning was confirmed and documented with a spot radiographic image. The catheter aspirates and flushes normally. The catheter was flushed with appropriate volume heparin dwells. The catheter exit site was secured with a 0-Prolene retention suture. The venotomy incision was closed Derma bond and sterile dressing. Dressings were applied at the chest wall. Patient tolerated the procedure well and remained hemodynamically stable throughout. No  complications were encountered and no significant blood loss encountered. IMPRESSION: Status post placement of right IJ approach hemodialysis catheter measuring 19 cm tip to cuff. Catheter ready for use. Signed, Dulcy Fanny. Earleen Newport, DO [facility] Electronically Signed   By: Corrie Mckusick D.O.   On: 07/03/2015 18:01   Ir US Guide Vasc Access Right  07/03/2015  INDICATION: 40 year old male with a history of renal failure. He has been referred for hemodialysis catheter placement. EXAM: TUNNELED CENTRAL VENOUS HEMODIALYSIS CATHETER PLACEMENT WITH ULTRASOUND AND FLUOROSCOPIC GUIDANCE MEDICATIONS: 2.0 g  Ancef . The antibiotic was given in an appropriate time interval prior to skin puncture. ANESTHESIA/SEDATION: Moderate (conscious) sedation was employed during this procedure. A total of Versed 1.5 mg and Fentanyl 25 mcg was administered intravenously. Moderate Sedation Time: 15 minutes. The patient's level of consciousness and vital signs were monitored continuously by radiology nursing throughout the procedure under my direct supervision. FLUOROSCOPY TIME:  Fluoroscopy Time: 0 minutes 6 seconds (2 mGy). COMPLICATIONS: None PROCEDURE: Informed written consent was obtained from the patient after a discussion of the risks, benefits, and alternatives to treatment. Questions regarding the procedure were encouraged and answered. The right neck and chest were prepped with chlorhexidine in a sterile fashion, and a sterile drape was applied covering the operative field. Maximum barrier sterile technique with sterile gowns and gloves were used for the procedure. A timeout was performed prior to the initiation of the procedure. After creating a small venotomy incision, a micropuncture kit was utilized to access the right internal jugular vein under direct, real-time ultrasound guidance after the overlying soft tissues were anesthetized with 1% lidocaine with epinephrine.  Ultrasound image documentation was performed. The microwire was marked to measure appropriate internal catheter length. External tunneled length was estimated. A total tip to cuff length of 19 cm was selected. Skin and subcutaneous tissues of chest wall below the clavicle were generously infiltrated with 1% lidocaine for local anesthesia. A small stab incision was made with 11 blade scalpel. The selected hemodialysis catheter was tunneled in a retrograde fashion from the anterior chest wall to the venotomy incision. A guidewire was advanced to the level of the IVC and the micropuncture sheath was exchanged for a peel-away sheath. The catheter was then placed through the peel-away sheath with tips ultimately positioned within the superior aspect of the right atrium. Final catheter positioning was confirmed and documented with a spot radiographic image. The catheter aspirates and flushes normally. The catheter was flushed with appropriate volume heparin dwells. The catheter exit site was secured with a 0-Prolene retention suture. The venotomy incision was closed Derma bond and sterile dressing. Dressings were applied at the chest wall. Patient tolerated the procedure well and remained hemodynamically stable throughout. No complications were encountered and no significant blood loss encountered. IMPRESSION: Status post placement of right IJ approach hemodialysis catheter measuring 19 cm tip to cuff. Catheter ready for use. Signed, Dulcy Fanny. Earleen Newport, DO [facility] Electronically Signed   By: Corrie Mckusick D.O.   On: 07/03/2015 18:01   Dg Chest Port 1 View  07/06/2015  CLINICAL DATA:  Status post dialysis EXAM: PORTABLE CHEST 1 VIEW COMPARISON:  06/25/2015 FINDINGS: Cardiac shadow is mildly enlarged. A new dialysis catheter is noted. Lungs are hypoinflated with mild basilar atelectasis. No focal confluent infiltrate is noted. Stable nodular changes are noted on  the right related to pleural based lesions IMPRESSION: Mild bibasilar atelectasis. The remainder of the exam is relatively stable from the prior study. Electronically Signed   By: Inez Catalina M.D.   On: 07/06/2015 18:17    ASSESSMENT: 40 y.o. 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 (2.22 g/dL) and urine (145 mg/dL), bone marrow biopsy 02/03/2014 showing an average 12% plasmacytosis, with some sheets of abnormal plasma cells noted, cytogenetics pending; baseline beta 2 microglobulin was 7.36, baseline creatinine 1.64, with GFR 51, calcium on general 05/21/2014 was 10.8, LDH was normal  (1) Multiple myeloma stage IIA diagnosed January  2016;        I: CyBorD started January 2016  (a) dexamethasone 20 mg/d every TU/WED started 02/04/2014  (b) bortezomib sQ days 1,4,8,11 of every 21 day cycle started 02/10/2014  (c) cyclophosphamide 300 mg/M2 IV weekly started 02/13/2014       2: Lenalidomide 66m daily started 07/26/2014, discontinued December 2016 with progression  (2) 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 The patient is not a transplant candidate for financial reasons  (6) bilateral foot rash: Started on Septra DS and Diflucan 09/03/2014, resolved  (7) Multiple Myeloma relapse in December 2016.   (a) starting 02/05/15: dexamethasone 244mx2 consecutive days weekly; bortezomib sQ weekly,   (b) Septra DS twice daily  (c) acyclovir 40093maily   (d) bortezomib/ dexamethasone discontinued after 05/06/2015 dose with evidence of  progression  (8) started carfilzomib 05/28/2015, given with weekly low dose dexamethasone  (a) tolerated cyclophosphamide poorly, discontinued after cycle 1  (b) carfilzomib started 05/28/2015   (i) cycle 2 started 07/01/2015   (ii) cycle 3 to start 08/06/2015  (c) taking dexamethasone 20 mg every Sunday and Monday  (d) lenalidomide discontinued 07/29/2015  (9) on hemodialysis Tu/Th/Sa as of early June 2017  (10) adjuvant radiation completed 07/27/2015  PLAN: Ronin is responding to the carfilzomib. We are stopping the lenalidomide partly because of his response and partly because given his radiation it may be excessively suppressing his counts.  Of course he does not feel well. Particularly on hemodialysis days he feels wasted". He is anemic. He is receiving growth factors and iron at hemodialysis and of course she can be transfused in the course of hemodialysis most appropriately.   The plan for now is to continue the carfilzomib 3 weeks on and one-week off for another 2 cycles, and then depending on response, continue, or switch to maintenance.  Berman knows to call for any problems that may develop before his next visit here.   MAGChauncey CruelD   07/29/2015 6:43 PM

## 2015-07-29 ENCOUNTER — Telehealth: Payer: Self-pay | Admitting: Oncology

## 2015-07-29 ENCOUNTER — Ambulatory Visit: Payer: Self-pay

## 2015-07-29 ENCOUNTER — Other Ambulatory Visit (HOSPITAL_BASED_OUTPATIENT_CLINIC_OR_DEPARTMENT_OTHER): Payer: Self-pay

## 2015-07-29 ENCOUNTER — Ambulatory Visit (HOSPITAL_BASED_OUTPATIENT_CLINIC_OR_DEPARTMENT_OTHER): Payer: Self-pay | Admitting: Oncology

## 2015-07-29 VITALS — BP 105/75 | HR 78 | Temp 98.1°F | Resp 18 | Ht 61.0 in | Wt 131.7 lb

## 2015-07-29 DIAGNOSIS — C9000 Multiple myeloma not having achieved remission: Secondary | ICD-10-CM

## 2015-07-29 DIAGNOSIS — C7951 Secondary malignant neoplasm of bone: Secondary | ICD-10-CM

## 2015-07-29 DIAGNOSIS — C9002 Multiple myeloma in relapse: Secondary | ICD-10-CM

## 2015-07-29 DIAGNOSIS — Z992 Dependence on renal dialysis: Secondary | ICD-10-CM | POA: Insufficient documentation

## 2015-07-29 DIAGNOSIS — N179 Acute kidney failure, unspecified: Secondary | ICD-10-CM

## 2015-07-29 DIAGNOSIS — N186 End stage renal disease: Secondary | ICD-10-CM

## 2015-07-29 DIAGNOSIS — G893 Neoplasm related pain (acute) (chronic): Secondary | ICD-10-CM

## 2015-07-29 LAB — COMPREHENSIVE METABOLIC PANEL
ALK PHOS: 157 U/L — AB (ref 40–150)
ALT: 11 U/L (ref 0–55)
ANION GAP: 10 meq/L (ref 3–11)
AST: 13 U/L (ref 5–34)
Albumin: 1.8 g/dL — ABNORMAL LOW (ref 3.5–5.0)
BILIRUBIN TOTAL: 0.5 mg/dL (ref 0.20–1.20)
BUN: 26.2 mg/dL — ABNORMAL HIGH (ref 7.0–26.0)
CALCIUM: 7.8 mg/dL — AB (ref 8.4–10.4)
CO2: 32 meq/L — AB (ref 22–29)
CREATININE: 4 mg/dL — AB (ref 0.7–1.3)
Chloride: 99 mEq/L (ref 98–109)
EGFR: 17 mL/min/{1.73_m2} — AB (ref >90)
Glucose: 89 mg/dl (ref 70–140)
Potassium: 3.7 mEq/L (ref 3.5–5.1)
Sodium: 141 mEq/L (ref 136–145)
TOTAL PROTEIN: 6 g/dL — AB (ref 6.4–8.3)

## 2015-07-29 LAB — CBC WITH DIFFERENTIAL/PLATELET
BASO%: 1.2 % (ref 0.0–2.0)
Basophils Absolute: 0.1 10*3/uL (ref 0.0–0.1)
EOS ABS: 0 10*3/uL (ref 0.0–0.5)
EOS%: 0.6 % (ref 0.0–7.0)
HEMATOCRIT: 23.2 % — AB (ref 38.4–49.9)
HGB: 7.4 g/dL — ABNORMAL LOW (ref 13.0–17.1)
LYMPH#: 0.7 10*3/uL — AB (ref 0.9–3.3)
LYMPH%: 9.8 % — AB (ref 14.0–49.0)
MCH: 30.8 pg (ref 27.2–33.4)
MCHC: 31.9 g/dL — ABNORMAL LOW (ref 32.0–36.0)
MCV: 96.6 fL (ref 79.3–98.0)
MONO#: 0.7 10*3/uL (ref 0.1–0.9)
MONO%: 9.8 % (ref 0.0–14.0)
NEUT%: 78.6 % — AB (ref 39.0–75.0)
NEUTROS ABS: 5.4 10*3/uL (ref 1.5–6.5)
PLATELETS: 417 10*3/uL — AB (ref 140–400)
RBC: 2.4 10*6/uL — AB (ref 4.20–5.82)
RDW: 14.4 % (ref 11.0–14.6)
WBC: 6.9 10*3/uL (ref 4.0–10.3)

## 2015-07-29 NOTE — Telephone Encounter (Signed)
appt made and avs printed °

## 2015-07-29 NOTE — Progress Notes (Signed)
Patient not being tx today per Val D RN.

## 2015-07-30 ENCOUNTER — Ambulatory Visit: Payer: Self-pay

## 2015-08-03 ENCOUNTER — Ambulatory Visit (HOSPITAL_BASED_OUTPATIENT_CLINIC_OR_DEPARTMENT_OTHER): Payer: Self-pay

## 2015-08-03 ENCOUNTER — Other Ambulatory Visit: Payer: Self-pay | Admitting: *Deleted

## 2015-08-03 ENCOUNTER — Ambulatory Visit (HOSPITAL_COMMUNITY)
Admission: RE | Admit: 2015-08-03 | Discharge: 2015-08-03 | Disposition: A | Discharge status: Home / Self Care | Payer: Self-pay | Source: Ambulatory Visit | Attending: Oncology | Admitting: Oncology

## 2015-08-03 VITALS — BP 135/82 | HR 65 | Temp 97.1°F | Resp 16

## 2015-08-03 DIAGNOSIS — C9000 Multiple myeloma not having achieved remission: Secondary | ICD-10-CM | POA: Insufficient documentation

## 2015-08-03 DIAGNOSIS — D638 Anemia in other chronic diseases classified elsewhere: Secondary | ICD-10-CM

## 2015-08-03 DIAGNOSIS — D649 Anemia, unspecified: Secondary | ICD-10-CM

## 2015-08-03 DIAGNOSIS — M899 Disorder of bone, unspecified: Secondary | ICD-10-CM | POA: Insufficient documentation

## 2015-08-03 LAB — CBC WITH DIFFERENTIAL/PLATELET
BASO%: 0 % (ref 0.0–2.0)
BASOS ABS: 0 10*3/uL (ref 0.0–0.1)
EOS ABS: 0 10*3/uL (ref 0.0–0.5)
EOS%: 0 % (ref 0.0–7.0)
HCT: 17.8 % — ABNORMAL LOW (ref 38.4–49.9)
HGB: 5.6 g/dL — CL (ref 13.0–17.1)
LYMPH%: 4.2 % — AB (ref 14.0–49.0)
MCH: 30.6 pg (ref 27.2–33.4)
MCHC: 31.5 g/dL — AB (ref 32.0–36.0)
MCV: 97.3 fL (ref 79.3–98.0)
MONO#: 0.6 10*3/uL (ref 0.1–0.9)
MONO%: 5.5 % (ref 0.0–14.0)
NEUT#: 9.5 10*3/uL — ABNORMAL HIGH (ref 1.5–6.5)
NEUT%: 90.3 % — ABNORMAL HIGH (ref 39.0–75.0)
NRBC: 0 % (ref 0–0)
PLATELETS: 316 10*3/uL (ref 140–400)
RBC: 1.83 10*6/uL — AB (ref 4.20–5.82)
RDW: 15.4 % — AB (ref 11.0–14.6)
WBC: 10.5 10*3/uL — ABNORMAL HIGH (ref 4.0–10.3)
lymph#: 0.4 10*3/uL — ABNORMAL LOW (ref 0.9–3.3)

## 2015-08-03 LAB — PREPARE RBC (CROSSMATCH)

## 2015-08-03 MED ORDER — ACETAMINOPHEN 325 MG PO TABS
650.0000 mg | ORAL_TABLET | Freq: Once | ORAL | Status: AC
  Administered 2015-08-03: 650 mg via ORAL

## 2015-08-03 MED ORDER — DIPHENHYDRAMINE HCL 25 MG PO CAPS
ORAL_CAPSULE | ORAL | Status: AC
  Filled 2015-08-03: qty 1

## 2015-08-03 MED ORDER — SODIUM CHLORIDE 0.9 % IV SOLN
250.0000 mL | Freq: Once | INTRAVENOUS | Status: AC
  Administered 2015-08-03: 250 mL via INTRAVENOUS

## 2015-08-03 MED ORDER — DIPHENHYDRAMINE HCL 25 MG PO CAPS
25.0000 mg | ORAL_CAPSULE | Freq: Once | ORAL | Status: AC
  Administered 2015-08-03: 25 mg via ORAL

## 2015-08-03 MED ORDER — ACETAMINOPHEN 325 MG PO TABS
ORAL_TABLET | ORAL | Status: AC
  Filled 2015-08-03: qty 2

## 2015-08-03 NOTE — Patient Instructions (Signed)
Blood Transfusion   A blood transfusion is a procedure that gives you donated blood through an IV tube. You may need blood because of illness, surgery, or injury. The blood may come from a donor. The blood may also be your own blood that you donated earlier.  The blood you get is made up of different types of cells. You may get:    Red blood cells. These carry oxygen and replace lost blood.    Platelets. These control bleeding.    Plasma. This helps blood to clot.  If you have a clotting disorder, you may also get other types of blood products.   BEFORE THE PROCEDURE   You may have a blood test. This finds out what type of blood you have. It also finds out what kind of blood your body will accept.    If you are going to have a planned surgery, you may donate your own blood. This is done in case you need to have a transfusion.    If you have had an allergic transfusion reaction before, you may be given medicine to help prevent a reaction. Take this medicine only as told by your doctor.   You will have your temperature, blood pressure, and pulse checked.  PROCEDURE    An IV will be started in your hand or arm.    The bag of donated blood will be attached to your IV and run into your vein.    A doctor will regularly check your temperature, blood pressure, and pulse during the procedure. This is done to find any early signs of a transfusion reaction.   If you have any signs or symptoms of a reaction, the procedure may be stopped and you may be given medicine.    When the transfusion is over, your IV will be removed.    Pressure may be applied to the IV site for a few minutes.    A bandage (dressing) will be applied.   The procedure may vary among doctors and hospitals.   AFTER THE PROCEDURE   Your blood pressure, temperature, and pulse will be checked regularly.     This information is not intended to replace advice given to you by your health care provider. Make sure you discuss any questions  you have with your health care provider.     Document Released: 04/15/2008 Document Revised: 02/07/2014 Document Reviewed: 11/27/2013  Elsevier Interactive Patient Education 2016 [employer] Inc[employer]

## 2015-08-05 ENCOUNTER — Other Ambulatory Visit: Payer: Self-pay

## 2015-08-05 LAB — TYPE AND SCREEN
ABO/RH(D): O POS
ANTIBODY SCREEN: NEGATIVE
Unit division: 0
Unit division: 0

## 2015-08-06 ENCOUNTER — Other Ambulatory Visit: Payer: Self-pay

## 2015-08-06 ENCOUNTER — Ambulatory Visit (HOSPITAL_BASED_OUTPATIENT_CLINIC_OR_DEPARTMENT_OTHER): Payer: Self-pay | Admitting: Nurse Practitioner

## 2015-08-06 ENCOUNTER — Other Ambulatory Visit (HOSPITAL_BASED_OUTPATIENT_CLINIC_OR_DEPARTMENT_OTHER): Payer: Self-pay

## 2015-08-06 ENCOUNTER — Encounter (HOSPITAL_COMMUNITY): Payer: Self-pay

## 2015-08-06 ENCOUNTER — Ambulatory Visit: Payer: Self-pay

## 2015-08-06 ENCOUNTER — Inpatient Hospital Stay (HOSPITAL_COMMUNITY)
Admission: EM | Admit: 2015-08-06 | Discharge: 2015-08-10 | DRG: 193 | Disposition: A | Discharge status: Home / Self Care | Payer: Medicaid Other | Attending: Internal Medicine | Admitting: Internal Medicine

## 2015-08-06 ENCOUNTER — Other Ambulatory Visit (HOSPITAL_COMMUNITY): Payer: Self-pay

## 2015-08-06 ENCOUNTER — Emergency Department (HOSPITAL_COMMUNITY): Payer: Medicaid Other

## 2015-08-06 DIAGNOSIS — I10 Essential (primary) hypertension: Secondary | ICD-10-CM | POA: Diagnosis present

## 2015-08-06 DIAGNOSIS — C9002 Multiple myeloma in relapse: Secondary | ICD-10-CM | POA: Diagnosis present

## 2015-08-06 DIAGNOSIS — N186 End stage renal disease: Secondary | ICD-10-CM

## 2015-08-06 DIAGNOSIS — E8889 Other specified metabolic disorders: Secondary | ICD-10-CM | POA: Diagnosis present

## 2015-08-06 DIAGNOSIS — E46 Unspecified protein-calorie malnutrition: Secondary | ICD-10-CM

## 2015-08-06 DIAGNOSIS — Z888 Allergy status to other drugs, medicaments and biological substances status: Secondary | ICD-10-CM | POA: Diagnosis not present

## 2015-08-06 DIAGNOSIS — N390 Urinary tract infection, site not specified: Secondary | ICD-10-CM | POA: Diagnosis present

## 2015-08-06 DIAGNOSIS — E7800 Pure hypercholesterolemia, unspecified: Secondary | ICD-10-CM | POA: Diagnosis present

## 2015-08-06 DIAGNOSIS — J189 Pneumonia, unspecified organism: Secondary | ICD-10-CM | POA: Diagnosis present

## 2015-08-06 DIAGNOSIS — Z9221 Personal history of antineoplastic chemotherapy: Secondary | ICD-10-CM

## 2015-08-06 DIAGNOSIS — K219 Gastro-esophageal reflux disease without esophagitis: Secondary | ICD-10-CM | POA: Diagnosis present

## 2015-08-06 DIAGNOSIS — Z992 Dependence on renal dialysis: Secondary | ICD-10-CM | POA: Diagnosis not present

## 2015-08-06 DIAGNOSIS — I120 Hypertensive chronic kidney disease with stage 5 chronic kidney disease or end stage renal disease: Secondary | ICD-10-CM | POA: Diagnosis present

## 2015-08-06 DIAGNOSIS — R112 Nausea with vomiting, unspecified: Secondary | ICD-10-CM | POA: Diagnosis present

## 2015-08-06 DIAGNOSIS — D638 Anemia in other chronic diseases classified elsewhere: Secondary | ICD-10-CM | POA: Diagnosis present

## 2015-08-06 DIAGNOSIS — R918 Other nonspecific abnormal finding of lung field: Secondary | ICD-10-CM | POA: Diagnosis present

## 2015-08-06 DIAGNOSIS — N2581 Secondary hyperparathyroidism of renal origin: Secondary | ICD-10-CM | POA: Diagnosis present

## 2015-08-06 DIAGNOSIS — A419 Sepsis, unspecified organism: Secondary | ICD-10-CM

## 2015-08-06 DIAGNOSIS — Z923 Personal history of irradiation: Secondary | ICD-10-CM | POA: Diagnosis not present

## 2015-08-06 DIAGNOSIS — Z881 Allergy status to other antibiotic agents status: Secondary | ICD-10-CM

## 2015-08-06 DIAGNOSIS — C9000 Multiple myeloma not having achieved remission: Secondary | ICD-10-CM

## 2015-08-06 DIAGNOSIS — E43 Unspecified severe protein-calorie malnutrition: Secondary | ICD-10-CM | POA: Diagnosis present

## 2015-08-06 DIAGNOSIS — Z79899 Other long term (current) drug therapy: Secondary | ICD-10-CM

## 2015-08-06 DIAGNOSIS — E876 Hypokalemia: Secondary | ICD-10-CM | POA: Diagnosis present

## 2015-08-06 DIAGNOSIS — Z833 Family history of diabetes mellitus: Secondary | ICD-10-CM

## 2015-08-06 DIAGNOSIS — N179 Acute kidney failure, unspecified: Secondary | ICD-10-CM

## 2015-08-06 DIAGNOSIS — D649 Anemia, unspecified: Secondary | ICD-10-CM

## 2015-08-06 DIAGNOSIS — R509 Fever, unspecified: Secondary | ICD-10-CM

## 2015-08-06 HISTORY — DX: Dependence on renal dialysis: N18.6

## 2015-08-06 HISTORY — DX: Personal history of other medical treatment: Z92.89

## 2015-08-06 HISTORY — DX: Calculus of kidney: N20.0

## 2015-08-06 HISTORY — DX: Dependence on renal dialysis: Z99.2

## 2015-08-06 LAB — COMPREHENSIVE METABOLIC PANEL
ALBUMIN: 1.9 g/dL — AB (ref 3.5–5.0)
ALK PHOS: 108 U/L (ref 40–150)
ALT: 9 U/L — ABNORMAL LOW (ref 17–63)
ANION GAP: 10 meq/L (ref 3–11)
AST: 9 U/L (ref 5–34)
AST: 17 U/L (ref 15–41)
Albumin: 1.8 g/dL — ABNORMAL LOW (ref 3.5–5.0)
Alkaline Phosphatase: 98 U/L (ref 38–126)
Anion gap: 8 (ref 5–15)
BILIRUBIN TOTAL: 0.49 mg/dL (ref 0.20–1.20)
BUN: 9 mg/dL (ref 7.0–26.0)
BUN: 9 mg/dL (ref 6–20)
CALCIUM: 8.1 mg/dL — AB (ref 8.4–10.4)
CHLORIDE: 98 mmol/L — AB (ref 101–111)
CO2: 30 mEq/L — ABNORMAL HIGH (ref 22–29)
CO2: 29 mmol/L (ref 22–32)
CREATININE: 2.2 mg/dL — AB (ref 0.7–1.3)
Calcium: 7.7 mg/dL — ABNORMAL LOW (ref 8.9–10.3)
Chloride: 98 mEq/L (ref 98–109)
Creatinine, Ser: 2.59 mg/dL — ABNORMAL HIGH (ref 0.61–1.24)
EGFR: 36 mL/min/{1.73_m2} — AB (ref >90)
GFR calc Af Amer: 34 mL/min — ABNORMAL LOW (ref >60)
GFR, EST NON AFRICAN AMERICAN: 29 mL/min — AB (ref >60)
Glucose, Bld: 121 mg/dL — ABNORMAL HIGH (ref 65–99)
Glucose: 146 mg/dl — ABNORMAL HIGH (ref 70–140)
POTASSIUM: 3.3 mmol/L — AB (ref 3.5–5.1)
Potassium: 3.4 mEq/L — ABNORMAL LOW (ref 3.5–5.1)
SODIUM: 135 mmol/L (ref 135–145)
Sodium: 138 mEq/L (ref 136–145)
TOTAL PROTEIN: 6.4 g/dL (ref 6.4–8.3)
Total Bilirubin: 0.5 mg/dL (ref 0.3–1.2)
Total Protein: 6.2 g/dL — ABNORMAL LOW (ref 6.5–8.1)

## 2015-08-06 LAB — CBC WITH DIFFERENTIAL/PLATELET
BASO%: 0.1 % (ref 0.0–2.0)
BASOS ABS: 0 10*3/uL (ref 0.0–0.1)
Basophils Absolute: 0 10*3/uL (ref 0.0–0.1)
Basophils Relative: 0 %
EOS ABS: 0.1 10*3/uL (ref 0.0–0.5)
EOS%: 0.4 % (ref 0.0–7.0)
Eosinophils Absolute: 0 10*3/uL (ref 0.0–0.7)
Eosinophils Relative: 0 %
HEMATOCRIT: 27.6 % — AB (ref 38.4–49.9)
HEMATOCRIT: 25 % — AB (ref 39.0–52.0)
HGB: 9.1 g/dL — ABNORMAL LOW (ref 13.0–17.1)
Hemoglobin: 8.1 g/dL — ABNORMAL LOW (ref 13.0–17.0)
LYMPH#: 0.9 10*3/uL (ref 0.9–3.3)
LYMPH%: 7.5 % — AB (ref 14.0–49.0)
LYMPHS ABS: 1 10*3/uL (ref 0.7–4.0)
LYMPHS PCT: 11 %
MCH: 30.5 pg (ref 27.2–33.4)
MCH: 30.7 pg (ref 26.0–34.0)
MCHC: 33 g/dL (ref 32.0–36.0)
MCHC: 32.4 g/dL (ref 30.0–36.0)
MCV: 92.6 fL (ref 79.3–98.0)
MCV: 94.7 fL (ref 78.0–100.0)
MONO ABS: 0.8 10*3/uL (ref 0.1–1.0)
MONO#: 0.8 10*3/uL (ref 0.1–0.9)
MONO%: 6.7 % (ref 0.0–14.0)
Monocytes Relative: 8 %
NEUT%: 85.3 % — AB (ref 39.0–75.0)
NEUTROS ABS: 9.7 10*3/uL — AB (ref 1.5–6.5)
NEUTROS ABS: 7.5 10*3/uL (ref 1.7–7.7)
Neutrophils Relative %: 81 %
PLATELETS: 252 10*3/uL (ref 140–400)
Platelets: 266 10*3/uL (ref 150–400)
RBC: 2.98 10*6/uL — ABNORMAL LOW (ref 4.20–5.82)
RBC: 2.64 MIL/uL — AB (ref 4.22–5.81)
RDW: 16.1 % — ABNORMAL HIGH (ref 11.0–14.6)
RDW: 16.4 % — ABNORMAL HIGH (ref 11.5–15.5)
WBC: 11.4 10*3/uL — AB (ref 4.0–10.3)
WBC: 9.4 10*3/uL (ref 4.0–10.5)

## 2015-08-06 LAB — URINALYSIS, ROUTINE W REFLEX MICROSCOPIC
Bilirubin Urine: NEGATIVE
Glucose, UA: NEGATIVE mg/dL
Ketones, ur: NEGATIVE mg/dL
Nitrite: NEGATIVE
PH: 8 (ref 5.0–8.0)
Protein, ur: >300 mg/dL — AB
SPECIFIC GRAVITY, URINE: 1.012 (ref 1.005–1.030)

## 2015-08-06 LAB — I-STAT CG4 LACTIC ACID, ED
LACTIC ACID, VENOUS: 1.15 mmol/L (ref 0.5–1.9)
Lactic Acid, Venous: 0.36 mmol/L — ABNORMAL LOW (ref 0.5–1.9)

## 2015-08-06 LAB — URINE MICROSCOPIC-ADD ON

## 2015-08-06 LAB — TROPONIN I: Troponin I: <0.03 ng/mL (ref <0.03)

## 2015-08-06 MED ORDER — VANCOMYCIN HCL 10 G IV SOLR
1500.0000 mg | Freq: Once | INTRAVENOUS | Status: AC
  Administered 2015-08-06: 1500 mg via INTRAVENOUS
  Filled 2015-08-06: qty 1500

## 2015-08-06 MED ORDER — SENNOSIDES-DOCUSATE SODIUM 8.6-50 MG PO TABS: 1.0000 | ORAL_TABLET | Freq: Every evening | ORAL | Status: DC | PRN

## 2015-08-06 MED ORDER — PRO-STAT SUGAR FREE PO LIQD
30.0000 mL | Freq: Two times a day (BID) | ORAL | Status: DC
  Administered 2015-08-06 – 2015-08-09 (×7): 30 mL via ORAL
  Filled 2015-08-06 (×8): qty 30

## 2015-08-06 MED ORDER — VANCOMYCIN HCL IN DEXTROSE 750-5 MG/150ML-% IV SOLN
750.0000 mg | INTRAVENOUS | Status: DC
  Administered 2015-08-08: 750 mg via INTRAVENOUS
  Filled 2015-08-06: qty 150

## 2015-08-06 MED ORDER — ACETAMINOPHEN 325 MG PO TABS
650.0000 mg | ORAL_TABLET | Freq: Once | ORAL | Status: AC | PRN
  Administered 2015-08-06: 650 mg via ORAL

## 2015-08-06 MED ORDER — SODIUM CHLORIDE 0.9 % IV BOLUS (SEPSIS)
1000.0000 mL | Freq: Once | INTRAVENOUS | Status: AC
  Administered 2015-08-06: 1000 mL via INTRAVENOUS

## 2015-08-06 MED ORDER — METOPROLOL TARTRATE 50 MG PO TABS
50.0000 mg | ORAL_TABLET | Freq: Two times a day (BID) | ORAL | Status: DC
  Administered 2015-08-07 – 2015-08-10 (×5): 50 mg via ORAL
  Filled 2015-08-06 (×6): qty 1

## 2015-08-06 MED ORDER — HYDRALAZINE HCL 25 MG PO TABS
25.0000 mg | ORAL_TABLET | Freq: Three times a day (TID) | ORAL | Status: DC
  Administered 2015-08-07 – 2015-08-10 (×9): 25 mg via ORAL
  Filled 2015-08-06 (×10): qty 1

## 2015-08-06 MED ORDER — DEXTROSE 5 % IV SOLN
1.0000 g | INTRAVENOUS | Status: DC
  Administered 2015-08-06: 1 g via INTRAVENOUS
  Filled 2015-08-06: qty 1

## 2015-08-06 MED ORDER — ACETAMINOPHEN 325 MG PO TABS
ORAL_TABLET | ORAL | Status: AC
  Filled 2015-08-06: qty 1

## 2015-08-06 MED ORDER — IMIPENEM-CILASTATIN 250 MG IV SOLR
250.0000 mg | Freq: Two times a day (BID) | INTRAVENOUS | Status: DC
  Administered 2015-08-07 – 2015-08-10 (×8): 250 mg via INTRAVENOUS
  Filled 2015-08-06 (×11): qty 250

## 2015-08-06 MED ORDER — MIRTAZAPINE 15 MG PO TABS
15.0000 mg | ORAL_TABLET | Freq: Every day | ORAL | Status: DC
  Administered 2015-08-06 – 2015-08-09 (×4): 15 mg via ORAL
  Filled 2015-08-06 (×4): qty 1

## 2015-08-06 MED ORDER — TRAMADOL HCL 50 MG PO TABS: 50.0000 mg | ORAL_TABLET | Freq: Four times a day (QID) | ORAL | Status: DC | PRN

## 2015-08-06 MED ORDER — ACETAMINOPHEN 650 MG RE SUPP: 650.0000 mg | Freq: Four times a day (QID) | RECTAL | Status: DC | PRN

## 2015-08-06 MED ORDER — PROMETHAZINE HCL 25 MG PO TABS: 12.5000 mg | ORAL_TABLET | Freq: Four times a day (QID) | ORAL | Status: DC | PRN

## 2015-08-06 MED ORDER — ACETAMINOPHEN 325 MG PO TABS: 650.0000 mg | ORAL_TABLET | Freq: Four times a day (QID) | ORAL | Status: DC | PRN

## 2015-08-06 MED ORDER — DEXAMETHASONE 4 MG PO TABS
20.0000 mg | ORAL_TABLET | ORAL | Status: DC
  Administered 2015-08-08 – 2015-08-09 (×2): 20 mg via ORAL
  Filled 2015-08-06: qty 5
  Filled 2015-08-06 (×2): qty 1

## 2015-08-06 MED ORDER — NEPRO/CARBSTEADY PO LIQD
237.0000 mL | Freq: Two times a day (BID) | ORAL | Status: DC
  Administered 2015-08-07 – 2015-08-09 (×2): 237 mL via ORAL

## 2015-08-06 MED ORDER — HEPARIN SODIUM (PORCINE) 5000 UNIT/ML IJ SOLN
5000.0000 [IU] | Freq: Three times a day (TID) | INTRAMUSCULAR | Status: DC
  Administered 2015-08-06 – 2015-08-10 (×11): 5000 [IU] via SUBCUTANEOUS
  Filled 2015-08-06 (×10): qty 1

## 2015-08-06 MED ORDER — CALCIUM ACETATE (PHOS BINDER) 667 MG PO CAPS
1334.0000 mg | ORAL_CAPSULE | Freq: Three times a day (TID) | ORAL | Status: DC
  Administered 2015-08-07 – 2015-08-08 (×2): 1334 mg via ORAL
  Filled 2015-08-06 (×4): qty 2

## 2015-08-06 MED ORDER — ACYCLOVIR 200 MG PO CAPS
400.0000 mg | ORAL_CAPSULE | Freq: Every day | ORAL | Status: DC
  Administered 2015-08-07 – 2015-08-10 (×4): 400 mg via ORAL
  Filled 2015-08-06 (×5): qty 2

## 2015-08-06 NOTE — ED Notes (Signed)
Per family, Pt is coming from the cancer center at [facility]. He was receiving chemo when the noticed patient had a fever of 102. Pt denies having fever before today.

## 2015-08-06 NOTE — ED Provider Notes (Signed)
CSN: [id number]     Arrival date & time 08/06/15  1615 History   First MD Initiated Contact with Patient 08/06/15 1648     Chief Complaint  Patient presents with  . Fever  Pt is a 40 yo spanish speaking male who with a hx of multiple myeloma diagnosed in Jan of 2016.  The pt is currently getting treated with Denosumab/Xgeva; carfilzomib, cyclophosphamide, dexamethasone.  The pt developed ESRD due to the myeloma and gets dialysis on Tues, Thurs, and Sat.  The pt presented today at Dr. Virgie Dad (oncology) office with a fever.  He did not get his scheduled injection and was directed here.  In triage, his temp was 102.  He was given tylenol.  Pt feels very tired and c/o some back pain.  He has a dialysis catheter in his right chest and a maturing AV fistula in his left upper arm.     (Consider location/radiation/quality/duration/timing/severity/associated sxs/prior Treatment) [name] presenting with fever. The history is provided by the patient. A language interpreter was used.  Fever Temp source:  Oral Severity:  Moderate Onset quality:  Gradual Timing:  Constant Progression:  Worsening Chronicity:  New Relieved by:  Nothing Associated symptoms: chills   Risk factors: hx of cancer and immunosuppression     Past Medical History  Diagnosis Date  . IgG myeloma ([place])   . Hypercholesterolemia   . Lytic bone lesions on xray   . CKD (chronic kidney disease), stage III     [address] [facility]  . Hypertension   . GERD (gastroesophageal reflux disease)   . Headache     occasional  . Neuromuscular disorder ([facility])     states both arms are shaking a lot   Past Surgical History  Procedure Laterality Date  . Insertion of dialysis catheter Right   . Av fistula placement Left 07/13/2015    Procedure: CREATION OF LEFT UPPER ARM  ARTERIOVENOUS (AV) FISTULA ;  Surgeon: Elam Dutch, MD;  Location: [facility] OR;  Service: Vascular;  Laterality: Left;   Family History  Problem  Relation Age of Onset  . Diabetes Father    Social History  Substance Use Topics  . Smoking status: Never Smoker   . Smokeless tobacco: Never Used  . Alcohol Use: No    Review of Systems  Constitutional: Positive for fever and chills.  All other systems reviewed and are negative.     Allergies  Ciprofloxacin and Mobic  Home Medications   Prior to Admission medications   Medication Sig Start Date End Date Taking? Authorizing Provider  acyclovir (ZOVIRAX) 200 MG capsule Take 2 capsules (400 mg total) by mouth daily. 06/11/15  Yes [name], MD  calcium acetate (PHOSLO) 667 MG capsule Take 2 capsules (1,334 mg total) by mouth 3 (three) times daily with meals. 07/10/15  Yes Modena Jansky, MD  Cholecalciferol (VITAMIN D PO) Take 500 Units by mouth daily.   Yes Historical Provider, MD  dexamethasone (DECADRON) 4 MG tablet Take 5 tablets (62m) on Saturday and Sunday every week ([place]. 07/11/15  Yes GChauncey Cruel MD  hydrALAZINE (APRESOLINE) 25 MG tablet Take 1 tablet (25 mg total) by mouth 3 (three) times daily. 07/10/15  Yes AModena Jansky MD  metoprolol (LOPRESSOR) 50 MG tablet Take 1 tablet (50 mg total) by mouth 2 (two) times daily. 07/10/15  Yes AModena Jansky MD  mirtazapine (  REMERON) 15 MG tablet Take 1 tablet (15 mg total) by mouth at bedtime. 07/10/15  Yes Modena Jansky, MD  multivitamin (RENA-VIT) TABS tablet Take 1 tablet by mouth at bedtime. 07/10/15  Yes Modena Jansky, MD  Nutritional Supplements (FEEDING SUPPLEMENT, NEPRO CARB STEADY,) LIQD Take 237 mLs by mouth 2 (two) times daily between meals. 07/10/15  Yes Modena Jansky, MD  prochlorperazine (COMPAZINE) 10 MG tablet Take 1 tablet (10 mg total) by mouth every 6 (six) hours as needed for nausea or vomiting. 06/11/15  Yes [name], MD  traMADol (ULTRAM) 50 MG tablet Take 1 tablet (50 mg total) by mouth every 6 (six) hours as needed. Patient  taking differently: Take 50 mg by mouth every 6 (six) hours as needed for moderate pain.  06/11/15  Yes [name], MD  Amino Acids-Protein Hydrolys (FEEDING SUPPLEMENT, PRO-STAT SUGAR FREE 64,) LIQD Take 30 mLs by mouth 2 (two) times daily. 07/10/15   Modena Jansky, MD  polyethylene glycol (MIRALAX / GLYCOLAX) packet Take 17 g by mouth daily. Patient not taking: Reported on 07/27/2015 07/10/15   Modena Jansky, MD  senna-docusate (SENOKOT-S) 8.6-50 MG tablet Take 2 tablets by mouth 2 (two) times daily. Patient not taking: Reported on 07/27/2015 07/10/15   Modena Jansky, MD   BP 132/76 mmHg  Pulse 97  Temp(Src) 102 F (38.9 C) (Oral)  Resp 21  Ht 5' 4"  (1.626 m)  Wt 152 lb 1.9 oz (69 kg)  BMI 26.10 kg/m2  SpO2 100% Physical Exam  Constitutional: He is oriented to person, place, and time. He appears well-developed and well-nourished.  HENT:  Head: Normocephalic and atraumatic.  Right Ear: External ear normal.  Left Ear: External ear normal.  Nose: Nose normal.  Mouth/Throat: Mucous membranes are dry.  Eyes: Conjunctivae and EOM are normal. Pupils are equal, round, and reactive to light.  Neck: Normal range of motion. Neck supple.  Cardiovascular: Normal rate, regular rhythm, normal heart sounds and intact distal pulses.     Pulmonary/Chest: Effort normal and breath sounds normal.  Abdominal: Soft. Bowel sounds are normal.  Musculoskeletal: Normal range of motion.  Neurological: He is alert and oriented to person, place, and time.  Skin: Skin is warm and dry.  Psychiatric: He has a normal mood and affect. His behavior is normal. Judgment and thought content normal.  Nursing note and vitals reviewed.   ED Course  Procedures (including critical care time) Labs Review Labs Reviewed  COMPREHENSIVE METABOLIC PANEL - Abnormal; Notable for the following:    Potassium 3.3 (*)    Chloride 98 (*)    Glucose, Bld 121 (*)    Creatinine, Ser 2.59 (*)    Calcium 7.7 (*)    Total  Protein 6.2 (*)    Albumin 1.9 (*)    ALT 9 (*)    GFR calc non Af Amer 29 (*)    GFR calc Af Amer 34 (*)    All other components within normal limits  URINALYSIS, ROUTINE W REFLEX MICROSCOPIC (NOT AT [facility]) - Abnormal; Notable for the following:    Color, Urine RED (*)    APPearance CLOUDY (*)    Hgb urine dipstick LARGE (*)    Protein, ur >300 (*)    Leukocytes, UA MODERATE (*)    All other components within normal limits  CBC WITH DIFFERENTIAL/PLATELET - Abnormal; Notable for the following:    RBC 2.64 (*)    Hemoglobin 8.1 (*)  HCT 25.0 (*)    RDW 16.4 (*)    All other components within normal limits  URINE MICROSCOPIC-ADD ON - Abnormal; Notable for the following:    Squamous Epithelial / LPF 0-5 (*)    Bacteria, UA MANY (*)    Casts HYALINE CASTS (*)    All other components within normal limits  CULTURE, BLOOD (ROUTINE X 2)  CULTURE, BLOOD (ROUTINE X 2)  URINE CULTURE  TROPONIN I  I-STAT CG4 LACTIC ACID, ED    Imaging Review Dg Chest 2 View  08/06/2015  CLINICAL DATA:  Fever. Chemotherapy treatment for multiple myeloma. Chronic kidney disease. EXAM: CHEST  2 VIEW COMPARISON:  07/07/2015 FINDINGS: Right internal jugular central line has its tips in the right atrium. The heart is enlarged. There is venous hypertension with interstitial edema. There are bilateral pleural effusions with atelectasis in the dependent lungs. Basilar pneumonia not excluded. IMPRESSION: Dialysis catheter unchanged with the tip in the right atrium. Venous hypertension and interstitial edema. Bilateral dependent effusions with basilar atelectasis. Basilar pneumonia not excluded. Electronically Signed   By: Nelson Chimes M.D.   On: 08/06/2015 18:58   I have personally reviewed and evaluated these images and lab results as part of my medical decision-making.   EKG Interpretation None     Ekg:  Hr 121.  No st or t wave changes.  Pacs.  MDM  Pt meets sepsis criteria, so a code sepsis was called.   Pt's pneumonia meets HAP, so he was treated with the appropriate abx.  Pt d/w Dr. Loleta Books (triad) for admission. Final diagnoses:  Sepsis, due to unspecified organism [facility])  ESRD on hemodialysis ([place])  Multiple myeloma not having achieved remission ([place])  Chronic anemia  Fever, unspecified fever cause       Isla Pence, MD 08/12/15 [phone]

## 2015-08-06 NOTE — Progress Notes (Addendum)
Pharmacy Antibiotic Note  Charles [name] is a 40 y.o. male admitted on 08/06/2015 with pneumonia.  Pharmacy has been consulted for Primaxin and Vancomycin dosing.  Undergoing treatment for multiple myeloma, Dialysis on T, Th, Saturdays History of ESBL UTI  --> Cefepime switched to Primaxin  Plan: Primaxin 250 mg iv Q 12 hours Vancomycin 1500 mg iv x 1 now Follow up HD schedule   Height: _0  (162.6 cm) Weight: 152 lb 1.9 oz (69 kg) IBW/kg (Calculated) : 59.2  Temp (24hrs), Avg:100.5 F (38.1 C), Min:98.8 F (37.1 C), Max:102 F (38.9 C)   Recent Labs Lab 08/03/15 1248 08/06/15 1321 08/06/15 1649 08/06/15 1726 08/06/15 1737  WBC 10.5* 11.4*  --  9.4  --   CREATININE  --  2.2* 2.59*  --   --   LATICACIDVEN  --   --   --   --  1.15    Estimated Creatinine Clearance: 31.7 mL/min (by C-G formula based on Cr of 2.59).    Allergies  Allergen Reactions  . Ciprofloxacin Rash  . Mobic [Meloxicam] Rash     Thank you for allowing pharmacy to be a part of this patient's care. Anette Guarneri, PharmD [phone]  08/06/2015 7:25 PM

## 2015-08-06 NOTE — Progress Notes (Signed)
Dr. Jana Hakim aware of patient's current condition - febrile, chills, tachycardia, elevated WBC, worsening fatigue, and nausea. Advised to hold Kyprolis infusion today. Selena Lesser, NP saw patient in Infusion Room and sent him to [facility] ED for eval.

## 2015-08-06 NOTE — H&P (Signed)
History and Physical  Patient Name: Charles [name]     ZOX:[id number]    DOB: July 29, 1975    DOA: 08/06/2015 PCP: [name], MD   Patient coming from: Home --> Dialysis center --> Onc clinic --> ER  Outpatient specialist:  Oncology, Dr. Jana Hakim Chief Complaint: Lucky Rathke, fever  HPI: Charles [name] is a 40 y.o. 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 Jana Hakim who presents with fever and malaise.  All history collected through telephonic interpreter, chart review, or from daughter who speaks floating which. The patient was diagnosed with multiple myeloma January 2016 after presenting with chest pain and found to have lytic bone lesions of manubrium, ARF, and M-spike.  Started on CyBorD but relapsed in Dec 2016.  Started on bortezomib again, but appears to have progressed and then started on cyclophosphamide, carfilzomib, denosumab/Xgeva, and dexamethasone April 2017, followed by Dr. Jana Hakim.  Underwent radiation therapy for lung mass this spring as well.  Cyclophosphamide had to be stopped, but carfilzomib is continued.  Six weeks ago the patient was admitted for 2 weeks for acute on chronic renal failure that ultimately required intiation of dialysis.  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  MRI of the complete spine showed extensive bone marrow infiltration throughout as well as thoracic extradural masses at T4 and T7 causing mild compression, undergoing XRT.  Since discharge, the patient completed XRT and completed cycle 2 of carfilzomib.  Was feeling generally weak and "wasted" but functional and ambulatory.  Then, Tuesday, family went to a cookout for July 4 and afterwards he seemed  unwell.  All day yesterday, he felt unwell, vomited once NBNB emesis, and seemed more run down than usual. This morning he went to dialysis as usual and was noted to have a fever afterwards he went to his oncologist for scheduled visit to start cycle 3 of carbo thousand, but had fever again to 102F, mild leukocytosis and was sent to the ER.  ED course: -Febrile to 102F, tachycardic, tachypneic, BP stable, SpO2 normal on ambient air -Na 138, K 3.4, Cr 2.2 (full HD treatment today), WBC 11.4K at [facility], 9.4K in ER, Hgb 9.1 in Onc clinic, 8.1 here (had been 5.6 g/dL before transfusion on Monday), lactic acid normal -Chest x-ray showed low volumes and equivocal opacities at both bases, possibly vascular crowding -Urine showed hematuria and bacteria (still makes urine) -The patient was treated empirically for sepsis and TRH was asked to admit for evaluation  The patient has had no sick contacts recently. He has had no respiratory symptoms or urinary symptoms. He has had no skin sores, and he dialyzes from a tunneled catheter that has not been sore or draining.        ROS: Pt complains of malaise, fatigue, vomiting once.  Pt denies any confusion, syncope, cough, chest congestion, dyspnea, dysuria, urinary frequency, flank pain, abdominal pain, diarrhea, recurrent vomiting skin rashes, skin sores, pain around the catheter.    All other systems negative except as just noted or noted in the history of present illness.    Past Medical History  Diagnosis Date  . IgG myeloma ([place])   . Hypercholesterolemia   . Lytic bone lesions on xray   . CKD (chronic  kidney disease), stage III     [address] [facility]  . Hypertension   . GERD (gastroesophageal reflux disease)   . Headache     occasional  . Neuromuscular disorder ([facility])     states both arms are shaking a lot    Past Surgical History  Procedure Laterality Date  . Insertion of dialysis catheter Right   . Av fistula placement Left  07/13/2015    Procedure: CREATION OF LEFT UPPER ARM  ARTERIOVENOUS (AV) FISTULA ;  Surgeon: Elam Dutch, MD;  Location[facility] OR;  Service: Vascular;  Laterality: Left;    Social History: Patient lives With his wife and 5 children.  The patient walks unassisted, but has been tired enough recently that he would have trouble with stairs. He does not smoke.    Allergies  Allergen Reactions  . Ciprofloxacin Rash  . Mobic [Meloxicam] Rash    Family history: family history includes Diabetes in his father.  Prior to Admission medications   Medication Sig Start Date End Date Taking? Authorizing Provider  acyclovir (ZOVIRAX) 200 MG capsule Take 2 capsules (400 mg total) by mouth daily. 06/11/15  Yes [name], MD  calcium acetate (PHOSLO) 667 MG capsule Take 2 capsules (1,334 mg total) by mouth 3 (three) times daily with meals. 07/10/15  Yes Modena Jansky, MD  Cholecalciferol (VITAMIN D PO) Take 500 Units by mouth daily.   Yes Historical Provider, MD  dexamethasone (DECADRON) 4 MG tablet Take 5 tablets (23m) on Saturday and Sunday every week (tome [place]. 07/11/15  Yes GChauncey Cruel MD  hydrALAZINE (APRESOLINE) 25 MG tablet Take 1 tablet (25 mg total) by mouth 3 (three) times daily. 07/10/15  Yes AModena Jansky MD  metoprolol (LOPRESSOR) 50 MG tablet Take 1 tablet (50 mg total) by mouth 2 (two) times daily. 07/10/15  Yes AModena Jansky MD  mirtazapine (REMERON) 15 MG tablet Take 1 tablet (15 mg total) by mouth at bedtime. 07/10/15  Yes AModena Jansky MD  multivitamin (RENA-VIT) TABS tablet Take 1 tablet by mouth at bedtime. 07/10/15  Yes AModena Jansky MD  Nutritional Supplements (FEEDING SUPPLEMENT, NEPRO CARB STEADY,) LIQD Take 237 mLs by mouth 2 (two) times daily between meals. 07/10/15  Yes AModena Jansky MD  prochlorperazine (COMPAZINE) 10 MG tablet Take 1 tablet (10 mg total) by mouth every 6 (six) hours as needed for nausea or  vomiting. 06/11/15  Yes GChauncey Cruel MD  traMADol (ULTRAM) 50 MG tablet Take 1 tablet (50 mg total) by mouth every 6 (six) hours as needed. Patient taking differently: Take 50 mg by mouth every 6 (six) hours as needed for moderate pain.  06/11/15  Yes GChauncey Cruel MD  Amino Acids-Protein Hydrolys (FEEDING SUPPLEMENT, PRO-STAT SUGAR FREE 64,) LIQD Take 30 mLs by mouth 2 (two) times daily. 07/10/15   AModena Jansky MD  polyethylene glycol (MIRALAX / GLYCOLAX) packet Take 17 g by mouth daily. Patient not taking: Reported on 07/27/2015 07/10/15   AModena Jansky MD  senna-docusate (SENOKOT-S) 8.6-50 MG tablet Take 2 tablets by mouth 2 (two) times daily. Patient not taking: Reported on 07/27/2015 07/10/15   AModena Jansky MD       Physical Exam: BP 126/69 mmHg  Pulse 91  Temp(Src) 98.8 F (37.1 C) (Oral)  Resp 16  Ht 5' 4"  (1.626 m)  Wt 69 kg (152 lb 1.9 oz)  BMI 26.10 kg/m2  SpO2 98% General appearance: Well-developed, adult  male, alert and in no acute distress, but appears sluggish and eyes closed unless roused.   Eyes: Conjunctiva normal, lids and lashes normal.   PERRL.  ENT: No nasal deformity, discharge.  OP tacky dry without lesions.   Lymph: No cervical or supraclavicular lymphadenopathy. Skin: Warm and dry.  No suspicious rashes or lesions.  Tunneled catheter site without redness or pain. Cardiac: Tachycardic, irregular, nl S1-S2, no murmurs appreciated.  Capillary refill is brisk.  No LE edema.  Radial and DP pulses 2+ and symmetric. Respiratory: Normal respiratory rate and rhythm.  CTAB without rales or wheezes. Diminished at bases.  GI: Abdomen soft without rigidity.  No  TTP. No ascites, distension, hepatosplenomegaly.   MSK: No deformities or effusions.  No clubbing/cyanosis. Neuro: Cranial nerves intact.  Sluggish and with eyes closed but oriented responding to questions appropriately, attention normal.  Speech is fluent in [place].  Moves all extremities equally  and with normal coordination.    Psych: Affect blunted, ill appearing.  Judgment and insight appear normal.       Labs on Admission:  I have personally reviewed following labs and imaging studies: CBC:  Recent Labs Lab 08/03/15 1248 08/06/15 1321 08/06/15 1726  WBC 10.5* 11.4* 9.4  NEUTROABS 9.5* 9.7* 7.5  HGB 5.6* 9.1* 8.1*  HCT 17.8* 27.6* 25.0*  MCV 97.3 92.6 94.7  PLT 316 252 425   Basic Metabolic Panel:  Recent Labs Lab 08/06/15 1321 08/06/15 1649  NA 138 135  K 3.4* 3.3*  CL  --  98*  CO2 30* 29  GLUCOSE 146* 121*  BUN 9.0 9  CREATININE 2.2* 2.59*  CALCIUM 8.1* 7.7*   GFR: Estimated Creatinine Clearance: 31.7 mL/min (by C-G formula based on Cr of 2.59).  Liver Function Tests:  Recent Labs Lab 08/06/15 1321 08/06/15 1649  AST 9 17  ALT <9 9*  ALKPHOS 108 98  BILITOT 0.49 0.5  PROT 6.4 6.2*  ALBUMIN 1.8* 1.9*   Cardiac Enzymes:  Recent Labs Lab 08/06/15 1726  TROPONINI <0.03   Sepsis Labs: Lactic acid 1.15 mmol/L   Radiological Exams on Admission: Personally reviewed: Dg Chest 2 View  08/06/2015  CLINICAL DATA:  Fever. Chemotherapy treatment for multiple myeloma. Chronic kidney disease. EXAM: CHEST  2 VIEW COMPARISON:  07/07/2015 FINDINGS: Right internal jugular central line has its tips in the right atrium. The heart is enlarged. There is venous hypertension with interstitial edema. There are bilateral pleural effusions with atelectasis in the dependent lungs. Basilar pneumonia not excluded. IMPRESSION: Dialysis catheter unchanged with the tip in the right atrium. Venous hypertension and interstitial edema. Bilateral dependent effusions with basilar atelectasis. Basilar pneumonia not excluded. Electronically Signed   By: Nelson Chimes M.D.   On: 08/06/2015 18:58    EKG: Independently reviewed. Rate 121, sinus rhythm, QTc 453, many PACs.  No ischemic changes.    Assessment/Plan  1. Fever, possible sepsis:  During last hospitalization had  fevers thought to be malignancy related not infectious (blood cx negative, urine with low colony count E coli).    However, at present, feel uncomfortable deferring antibiotics at present.  Suspected source unclear (blood/CLABSI vs pneumonia vs UTI). Organism unknown. Patient meets SIRS criteria given tachycardia, tachypnea, fever, although no obvious end organ dysfunction at present.  Lactate 1.15 mmol/L Antibiotics delivered in the ED.    -Sepsis bundle utilized:  -Blood and urine cultures drawn  -30 ml/kg bolus given in ED  -  Start targeted antibiotics with vancomycin and imipenem given recent history of ESBL urine culture   -Repeat renal function and complete blood count in AM  -Code SEPSIS called to E-link     2. Multiple myeloma:  -Tramadol for pain -Continue acyclovir -Continue weekly dexamethasone -Continue mirtazapine  3. 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 -Daily CBC  4. HTN with recent PRES:  -Continue metoprolol and hydralazine with hold parameters  5. ESRD from MM kidney:  HD TThS.   Electrolytes normal at present, but fluid bolus administered per sepsis protocol. -Continue Phoslo -Consult to Nephrology for maintenance HD needs  6. 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 Protein calorie malnutrition, severe degree: -Continue feeding supplements      DVT prophylaxis: Heparin  Code Status: FULL  Family Communication: Daughter at bedside.  Working diagnosis of possible infection discussed.    Disposition Plan: Anticipate IV fulids and empiric antibiotics tonight.  Follow culture data and discuss with Oncology.   Consults called: Nephrology Admission status: INPATIENT, med surg   Medical decision making: Patient seen at 7:35 PM on 08/06/2015.  The patient was discussed with Dr. Gilford Raid. What exists of the patient's chart was reviewed in depth.  Clinical condition: stable but requiring ongoing fluids and at risk  for decompensation, prognosis overall poor.        Edwin Dada [facility] Pager [phone]

## 2015-08-07 ENCOUNTER — Encounter: Payer: Self-pay | Admitting: Nurse Practitioner

## 2015-08-07 ENCOUNTER — Ambulatory Visit: Payer: Self-pay

## 2015-08-07 DIAGNOSIS — N186 End stage renal disease: Secondary | ICD-10-CM

## 2015-08-07 DIAGNOSIS — R300 Dysuria: Secondary | ICD-10-CM

## 2015-08-07 DIAGNOSIS — R509 Fever, unspecified: Secondary | ICD-10-CM

## 2015-08-07 DIAGNOSIS — A419 Sepsis, unspecified organism: Secondary | ICD-10-CM

## 2015-08-07 DIAGNOSIS — Z992 Dependence on renal dialysis: Secondary | ICD-10-CM

## 2015-08-07 DIAGNOSIS — C9000 Multiple myeloma not having achieved remission: Secondary | ICD-10-CM

## 2015-08-07 DIAGNOSIS — I10 Essential (primary) hypertension: Secondary | ICD-10-CM

## 2015-08-07 LAB — COMPREHENSIVE METABOLIC PANEL
ALK PHOS: 77 U/L (ref 38–126)
ALT: 6 U/L — AB (ref 17–63)
ANION GAP: 4 — AB (ref 5–15)
AST: 12 U/L — ABNORMAL LOW (ref 15–41)
Albumin: 1.5 g/dL — ABNORMAL LOW (ref 3.5–5.0)
BUN: 16 mg/dL (ref 6–20)
CALCIUM: 6.9 mg/dL — AB (ref 8.9–10.3)
CHLORIDE: 106 mmol/L (ref 101–111)
CO2: 27 mmol/L (ref 22–32)
CREATININE: 3.44 mg/dL — AB (ref 0.61–1.24)
GFR calc Af Amer: 24 mL/min — ABNORMAL LOW (ref >60)
GFR calc non Af Amer: 21 mL/min — ABNORMAL LOW (ref >60)
GLUCOSE: 98 mg/dL (ref 65–99)
Potassium: 3.3 mmol/L — ABNORMAL LOW (ref 3.5–5.1)
SODIUM: 137 mmol/L (ref 135–145)
Total Bilirubin: 0.5 mg/dL (ref 0.3–1.2)
Total Protein: 4.9 g/dL — ABNORMAL LOW (ref 6.5–8.1)

## 2015-08-07 LAB — URINE CULTURE

## 2015-08-07 LAB — CBC
HCT: 23.2 % — ABNORMAL LOW (ref 39.0–52.0)
HEMOGLOBIN: 7.4 g/dL — AB (ref 13.0–17.0)
MCH: 30.5 pg (ref 26.0–34.0)
MCHC: 31.9 g/dL (ref 30.0–36.0)
MCV: 95.5 fL (ref 78.0–100.0)
PLATELETS: 252 10*3/uL (ref 150–400)
RBC: 2.43 MIL/uL — AB (ref 4.22–5.81)
RDW: 16.5 % — ABNORMAL HIGH (ref 11.5–15.5)
WBC: 9.1 10*3/uL (ref 4.0–10.5)

## 2015-08-07 LAB — MRSA PCR SCREENING: MRSA by PCR: NEGATIVE

## 2015-08-07 NOTE — Assessment & Plan Note (Signed)
Patient presented to the [facility] today to receive his next cycle of Kyprolis chemotherapy.  He also continues to take Revlimid as directed.  Patient also underwent dialysis today.  Patient was noted to have a fever on initial check uric the cancer center 100.7.  He was also feeling increasingly fatigued and weak.  Decision was made to hold chemotherapy today and transport patient to the emergency department for further evaluation and management.  Patient will be scheduled for labs, visit, and chemotherapy next week on 08/12/2015.

## 2015-08-07 NOTE — Assessment & Plan Note (Signed)
Patient is considered an end-stage renal failure; and received dialysis on a 3 time per week basis.  He just completed his dialysis earlier today.

## 2015-08-07 NOTE — Assessment & Plan Note (Signed)
To have a fever on initial check up to 100.7.  Patient also is a dialysis patient; and just received his dialysis earlier today.  Patient denies any new GI upset, URI symptoms or UTI symptoms.  He also denies any new pain whatsoever.  Chemotherapy was held today; and patient will be transported to the [facility] emergency department for further evaluation and management.  Brief history.  Report were called to the emergency department charge nurse at [facility] (since pt is a dialysis patient); an inpatient was transported by his wife to the [facility] emergency department.  Note: Patient should be considered a full code; since there are no advanced directives on the chart.

## 2015-08-07 NOTE — Progress Notes (Signed)
SYMPTOM MANAGEMENT CLINIC    Chief Complaint: Fever  HPI:  Charles [name] 40 y.o. male diagnosed with multiple myeloma.  Currently undergoing Kyprolis chemotherapy therapy: As well as Revlimid.  Patient presented to the [facility] [facility] today to receive his next cycle of chemotherapy; but he was found to have a fever.   No history exists.    Review of Systems  Constitutional: Positive for fever, chills, weight loss and malaise/fatigue.  Neurological: Positive for weakness.  All other systems reviewed and are negative.   Past Medical History  Diagnosis Date  . Lytic bone lesions on xray   . Hypertension   . GERD (gastroesophageal reflux disease)   . Neuromuscular disorder ([place])     states both arms are shaking a lot  . ESRD (end stage renal disease) on dialysis ([place])     [address] (08/06/2015)  . Kidney stones   . History of blood transfusion 07/2015    "related to his kidneys"  . Headache     "monthly" (08/06/2015)  . IgG myeloma [facility])     Past Surgical History  Procedure Laterality Date  . Insertion of dialysis catheter Right ~ 07/2015    chest  . Av fistula placement Left 07/13/2015    Procedure: CREATION OF LEFT UPPER ARM  ARTERIOVENOUS (AV) FISTULA ;  Surgeon: Elam Dutch, MD;  Location: [facility];  Service: Vascular;  Laterality: Left;    has Lytic bone lesions on xray; ESRD (end stage renal disease) on dialysis ([place]); Hordeolum externum of right eye; Prostatitis; Hypoalbuminemia due to protein-calorie malnutrition ([place]); Rash; Cancer associated pain; Nausea with vomiting; Pain from bone metastases ([place]); Blurry vision, bilateral; HTN (hypertension), malignant; PRES (posterior reversible encephalopathy syndrome); FUO (fever of unknown origin); Back pain; Multiple myeloma not having achieved remission ([place]); Hemodialysis patient [facility]); and Fever on his problem list.    is allergic to ciprofloxacin and mobic.    Medication List       This list is accurate  as of: 08/06/15  4:42 PM.  Always use your most recent med list.               acyclovir 200 MG capsule  Commonly known as:  ZOVIRAX  Take 2 capsules (400 mg total) by mouth daily.     calcium acetate 667 MG capsule  Commonly known as:  PHOSLO  Take 2 capsules (1,334 mg total) by mouth 3 (three) times daily with meals.     dexamethasone 4 MG tablet  Commonly known as:  DECADRON  Take 5 tablets (48m) on Saturday and Sunday every week ([place].     feeding supplement (NEPRO CARB STEADY) Liqd  Take 237 mLs by mouth 2 (two) times daily between meals.     feeding supplement (PRO-STAT SUGAR FREE 64) Liqd  Take 30 mLs by mouth 2 (two) times daily.     hydrALAZINE 25 MG tablet  Commonly known as:  APRESOLINE  Take 1 tablet (25 mg total) by mouth 3 (three) times daily.     metoprolol 50 MG tablet  Commonly known as:  LOPRESSOR  Take 1 tablet (50 mg total) by mouth 2 (two) times daily.     mirtazapine 15 MG tablet  Commonly known as:  REMERON  Take 1 tablet (15 mg total) by mouth at bedtime.     multivitamin Tabs tablet  Take 1 tablet by mouth at bedtime.  polyethylene glycol packet  Commonly known as:  MIRALAX / GLYCOLAX  Take 17 g by mouth daily.     prochlorperazine 10 MG tablet  Commonly known as:  COMPAZINE  Take 1 tablet (10 mg total) by mouth every 6 (six) hours as needed for nausea or vomiting.     senna-docusate 8.6-50 MG tablet  Commonly known as:  Senokot-S  Take 2 tablets by mouth 2 (two) times daily.     traMADol 50 MG tablet  Commonly known as:  ULTRAM  Take 1 tablet (50 mg total) by mouth every 6 (six) hours as needed.     VITAMIN D PO  Take 500 Units by mouth daily.         PHYSICAL EXAMINATION  Oncology Vitals 08/07/2015 08/07/2015  Height - -  Weight - -  Weight (lbs) - -  BMI (kg/m2) - -  Temp 99.5 98.6  Pulse 105 97  Resp 18 20  SpO2 98 96  BSA (m2) - -   BP Readings from Last 2  Encounters:  08/07/15 136/78  08/06/15 136/84    Physical Exam  Constitutional: He is oriented to person, place, and time. He appears malnourished and dehydrated. He appears unhealthy. He appears cachectic. He appears toxic. He has a sickly appearance.  HENT:  Head: Normocephalic and atraumatic.  Eyes: Conjunctivae and EOM are normal. Pupils are equal, round, and reactive to light. Right eye exhibits no discharge. Left eye exhibits no discharge. No scleral icterus.  Neck: Normal range of motion.  Pulmonary/Chest: Effort normal. No respiratory distress.  Musculoskeletal: Normal range of motion.  Neurological: He is alert and oriented to person, place, and time.  Skin: Skin is warm and dry. There is pallor.  Psychiatric: Affect normal.  Nursing note and vitals reviewed.   LABORATORY DATA:. Admission on 08/06/2015  Component Date Value Ref Range Status  . Sodium 08/06/2015 135  135 - 145 mmol/L Final  . Potassium 08/06/2015 3.3* 3.5 - 5.1 mmol/L Final  . Chloride 08/06/2015 98* 101 - 111 mmol/L Final  . CO2 08/06/2015 29  22 - 32 mmol/L Final  . Glucose, Bld 08/06/2015 121* 65 - 99 mg/dL Final  . BUN 08/06/2015 9  6 - 20 mg/dL Final  . Creatinine, Ser 08/06/2015 2.59* 0.61 - 1.24 mg/dL Final  . Calcium 08/06/2015 7.7* 8.9 - 10.3 mg/dL Final  . Total Protein 08/06/2015 6.2* 6.5 - 8.1 g/dL Final  . Albumin 08/06/2015 1.9* 3.5 - 5.0 g/dL Final  . AST 08/06/2015 17  15 - 41 U/L Final  . ALT 08/06/2015 9* 17 - 63 U/L Final  . Alkaline Phosphatase 08/06/2015 98  38 - 126 U/L Final  . Total Bilirubin 08/06/2015 0.5  0.3 - 1.2 mg/dL Final  . GFR calc non Af Amer 08/06/2015 29* >60 mL/min Final  . GFR calc Af Amer 08/06/2015 34* >60 mL/min Final   Comment: (NOTE) The eGFR has been calculated using the CKD EPI equation. This calculation has not been validated in all clinical situations. eGFR's persistently <60 mL/min signify possible Chronic Kidney Disease.   . Anion gap 08/06/2015 8   5 - 15 Final  . Color, Urine 08/06/2015 RED* YELLOW Final   BIOCHEMICALS MAY BE AFFECTED BY COLOR  . APPearance 08/06/2015 CLOUDY* CLEAR Final  . Specific Gravity, Urine 08/06/2015 1.012  1.005 - 1.030 Final  . pH 08/06/2015 8.0  5.0 - 8.0 Final  . Glucose, UA 08/06/2015 NEGATIVE  NEGATIVE mg/dL Final  . Hgb urine  dipstick 08/06/2015 LARGE* NEGATIVE Final  . Bilirubin Urine 08/06/2015 NEGATIVE  NEGATIVE Final  . Ketones, ur 08/06/2015 NEGATIVE  NEGATIVE mg/dL Final  . Protein, ur 08/06/2015 >300* NEGATIVE mg/dL Final  . Nitrite 08/06/2015 NEGATIVE  NEGATIVE Final  . Leukocytes, UA 08/06/2015 MODERATE* NEGATIVE Final  . Specimen Description 08/06/2015 URINE, CLEAN CATCH   Final  . Special Requests 08/06/2015 NONE   Final  . Culture 08/06/2015 MULTIPLE SPECIES PRESENT, SUGGEST RECOLLECTION*  Final  . Report Status 08/06/2015 08/07/2015 FINAL   Final  . Lactic Acid, Venous 08/06/2015 1.15  0.5 - 1.9 mmol/L Final  . WBC 08/06/2015 9.4  4.0 - 10.5 K/uL Final  . RBC 08/06/2015 2.64* 4.22 - 5.81 MIL/uL Final  . Hemoglobin 08/06/2015 8.1* 13.0 - 17.0 g/dL Final  . HCT 08/06/2015 25.0* 39.0 - 52.0 % Final  . MCV 08/06/2015 94.7  78.0 - 100.0 fL Final  . MCH 08/06/2015 30.7  26.0 - 34.0 pg Final  . MCHC 08/06/2015 32.4  30.0 - 36.0 g/dL Final  . RDW 08/06/2015 16.4* 11.5 - 15.5 % Final  . Platelets 08/06/2015 266  150 - 400 K/uL Final  . Neutrophils Relative % 08/06/2015 81   Final  . Neutro Abs 08/06/2015 7.5  1.7 - 7.7 K/uL Final  . Lymphocytes Relative 08/06/2015 11   Final  . Lymphs Abs 08/06/2015 1.0  0.7 - 4.0 K/uL Final  . Monocytes Relative 08/06/2015 8   Final  . Monocytes Absolute 08/06/2015 0.8  0.1 - 1.0 K/uL Final  . Eosinophils Relative 08/06/2015 0   Final  . Eosinophils Absolute 08/06/2015 0.0  0.0 - 0.7 K/uL Final  . Basophils Relative 08/06/2015 0   Final  . Basophils Absolute 08/06/2015 0.0  0.0 - 0.1 K/uL Final  . Troponin I 08/06/2015 <0.03  <0.03 ng/mL Final  .  Squamous Epithelial / LPF 08/06/2015 0-5* NONE SEEN Final  . WBC, UA 08/06/2015 6-30  0 - 5 WBC/hpf Final  . RBC / HPF 08/06/2015 TOO NUMEROUS TO COUNT  0 - 5 RBC/hpf Final  . Bacteria, UA 08/06/2015 MANY* NONE SEEN Final  . Casts 08/06/2015 HYALINE CASTS* NEGATIVE Final  . Lactic Acid, Venous 08/06/2015 0.36* 0.5 - 1.9 mmol/L Final  . WBC 08/07/2015 9.1  4.0 - 10.5 K/uL Final  . RBC 08/07/2015 2.43* 4.22 - 5.81 MIL/uL Final  . Hemoglobin 08/07/2015 7.4* 13.0 - 17.0 g/dL Final  . HCT 08/07/2015 23.2* 39.0 - 52.0 % Final  . MCV 08/07/2015 95.5  78.0 - 100.0 fL Final  . MCH 08/07/2015 30.5  26.0 - 34.0 pg Final  . MCHC 08/07/2015 31.9  30.0 - 36.0 g/dL Final  . RDW 08/07/2015 16.5* 11.5 - 15.5 % Final  . Platelets 08/07/2015 252  150 - 400 K/uL Final  . Sodium 08/07/2015 137  135 - 145 mmol/L Final  . Potassium 08/07/2015 3.3* 3.5 - 5.1 mmol/L Final  . Chloride 08/07/2015 106  101 - 111 mmol/L Final  . CO2 08/07/2015 27  22 - 32 mmol/L Final  . Glucose, Bld 08/07/2015 98  65 - 99 mg/dL Final  . BUN 08/07/2015 16  6 - 20 mg/dL Final  . Creatinine, Ser 08/07/2015 3.44* 0.61 - 1.24 mg/dL Final  . Calcium 08/07/2015 6.9* 8.9 - 10.3 mg/dL Final  . Total Protein 08/07/2015 4.9* 6.5 - 8.1 g/dL Final  . Albumin 08/07/2015 1.5* 3.5 - 5.0 g/dL Final  . AST 08/07/2015 12* 15 - 41 U/L Final  . ALT 08/07/2015  6* 17 - 63 U/L Final  . Alkaline Phosphatase 08/07/2015 77  38 - 126 U/L Final  . Total Bilirubin 08/07/2015 0.5  0.3 - 1.2 mg/dL Final  . GFR calc non Af Amer 08/07/2015 21* >60 mL/min Final  . GFR calc Af Amer 08/07/2015 24* >60 mL/min Final   Comment: (NOTE) The eGFR has been calculated using the CKD EPI equation. This calculation has not been validated in all clinical situations. eGFR's persistently <60 mL/min signify possible Chronic Kidney Disease.   . Anion gap 08/07/2015 4* 5 - 15 Final  . MRSA by PCR 08/07/2015 NEGATIVE  NEGATIVE Final   Comment:        The GeneXpert MRSA Assay  (FDA approved for NASAL specimens only), is one component of a comprehensive MRSA colonization surveillance program. It is not intended to diagnose MRSA infection nor to guide or monitor treatment for MRSA infections.   Appointment on 08/06/2015  Component Date Value Ref Range Status  . WBC 08/06/2015 11.4* 4.0 - 10.3 10e3/uL Final  . NEUT# 08/06/2015 9.7* 1.5 - 6.5 10e3/uL Final  . HGB 08/06/2015 9.1* 13.0 - 17.1 g/dL Final  . HCT 08/06/2015 27.6* 38.4 - 49.9 % Final  . Platelets 08/06/2015 252  140 - 400 10e3/uL Final  . MCV 08/06/2015 92.6  79.3 - 98.0 fL Final  . MCH 08/06/2015 30.5  27.2 - 33.4 pg Final  . MCHC 08/06/2015 33.0  32.0 - 36.0 g/dL Final  . RBC 08/06/2015 2.98* 4.20 - 5.82 10e6/uL Final  . RDW 08/06/2015 16.1* 11.0 - 14.6 % Final  . lymph# 08/06/2015 0.9  0.9 - 3.3 10e3/uL Final  . MONO# 08/06/2015 0.8  0.1 - 0.9 10e3/uL Final  . Eosinophils Absolute 08/06/2015 0.1  0.0 - 0.5 10e3/uL Final  . Basophils Absolute 08/06/2015 0.0  0.0 - 0.1 10e3/uL Final  . NEUT% 08/06/2015 85.3* 39.0 - 75.0 % Final  . LYMPH% 08/06/2015 7.5* 14.0 - 49.0 % Final  . MONO% 08/06/2015 6.7  0.0 - 14.0 % Final  . EOS% 08/06/2015 0.4  0.0 - 7.0 % Final  . BASO% 08/06/2015 0.1  0.0 - 2.0 % Final  . Sodium 08/06/2015 138  136 - 145 mEq/L Final  . Potassium 08/06/2015 3.4* 3.5 - 5.1 mEq/L Final  . Chloride 08/06/2015 98  98 - 109 mEq/L Final  . CO2 08/06/2015 30* 22 - 29 mEq/L Final  . Glucose 08/06/2015 146* 70 - 140 mg/dl Final   Glucose reference range is for nonfasting patients. Fasting glucose reference range is 70- 100.  Marland Kitchen BUN 08/06/2015 9.0  7.0 - 26.0 mg/dL Final  . Creatinine 08/06/2015 2.2* 0.7 - 1.3 mg/dL Final  . Total Bilirubin 08/06/2015 0.49  0.20 - 1.20 mg/dL Final  . Alkaline Phosphatase 08/06/2015 108  40 - 150 U/L Final  . AST 08/06/2015 9  5 - 34 U/L Final  . ALT 08/06/2015 <9  0 - 55 U/L Final  . Total Protein 08/06/2015 6.4  6.4 - 8.3 g/dL Final  . Albumin  08/06/2015 1.8* 3.5 - 5.0 g/dL Final  . Calcium 08/06/2015 8.1* 8.4 - 10.4 mg/dL Final  . Anion Gap 08/06/2015 10  3 - 11 mEq/L Final  . EGFR 08/06/2015 36* >90 ml/min/1.73 m2 Final   eGFR is calculated using the CKD-EPI Creatinine Equation (2009)    RADIOGRAPHIC STUDIES: Dg Chest 2 View  08/06/2015  CLINICAL DATA:  Fever. Chemotherapy treatment for multiple myeloma. Chronic kidney disease. EXAM: CHEST  2 VIEW COMPARISON:  07/07/2015 FINDINGS: Right internal jugular central line has its tips in the right atrium. The heart is enlarged. There is venous hypertension with interstitial edema. There are bilateral pleural effusions with atelectasis in the dependent lungs. Basilar pneumonia not excluded. IMPRESSION: Dialysis catheter unchanged with the tip in the right atrium. Venous hypertension and interstitial edema. Bilateral dependent effusions with basilar atelectasis. Basilar pneumonia not excluded. Electronically Signed   By: Nelson Chimes M.D.   On: 08/06/2015 18:58    ASSESSMENT/PLAN:    Multiple myeloma not having achieved remission [facility]) Patient presented to the [facility] [facility] today to receive his next cycle of Kyprolis chemotherapy.  He also continues to take Revlimid as directed.  Patient also underwent dialysis today.  Patient was noted to have a fever on initial check uric the cancer center 100.7.  He was also feeling increasingly fatigued and weak.  Decision was made to hold chemotherapy today and transport patient to the emergency department for further evaluation and management.  Patient will be scheduled for labs, visit, and chemotherapy next week on 08/12/2015.  Fever To have a fever on initial check up to 100.7.  Patient also is a dialysis patient; and just received his dialysis earlier today.  Patient denies any new GI upset, URI symptoms or UTI symptoms.  He also denies any new pain whatsoever.  Chemotherapy was held today; and patient will be transported to the [facility] emergency  department for further evaluation and management.  Brief history.  Report were called to the emergency department charge nurse at [facility] (since pt is a dialysis patient); an inpatient was transported by his wife to the [facility] emergency department.  Note: Patient should be considered a full code; since there are no advanced directives on the chart.    ESRD (end stage renal disease) on dialysis [facility]) Patient is considered an end-stage renal failure; and received dialysis on a 3 time per week basis.  He just completed his dialysis earlier today.   Patient stated understanding of all instructions; and was in agreement with this plan of care. The patient knows to call the clinic with any problems, questions or concerns.   Total time spent with patient was 25 minutes;  with greater than 75 percent of that time spent in face to face counseling regarding patient's symptoms,  and coordination of care and follow up.  Disclaimer:This dictation was prepared with Dragon/digital dictation along with [employer]. Any transcriptional errors that result from this process are unintentional.  Drue Second, NP 08/07/2015

## 2015-08-07 NOTE — Progress Notes (Signed)
Progress Note    Charles [name]  GKK:[id number] DOB: December 05, 1975  DOA: 08/06/2015 PCP: [name], MD    Brief Narrative:   Charles [name] is an 40 y.o. 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 Jana Hakim who presents with fever and malaise. He was admitted for sepsis from possible UTI.   Assessment/Plan:   Principal Problem:   Fever Active Problems:   ESRD (end stage renal disease) on dialysis ([facility])   Hypoalbuminemia due to protein-calorie malnutrition ([facility])   HTN (hypertension), malignant  Sepsis of unclear source: Though ESRD, he makes urine and it appears to have moderate leukocytosis. cutlures show multiple bacterial morphotypes.  CXR shows interstitial edema.  Basilar pneumonia not excluded.  Started on IV antibiotics broad spectrum.  Blood cultures ordered and pending.  [place] oxygen as needed.  Wbc count normalized.    ESRD on HD: Further management as per renal.  HD on TTS.   Multiple myeloma: Further management as per oncology. Chemo on hold as his counts are low.  Outpatient follow up on 7/12 .   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   Nausea and vomiting.  Symptomatic management.    Mild hypokalemia HD pt, would monitor.     Family Communication/Anticipated D/C date and plan/Code Status   DVT prophylaxis: Lovenox ordered. Code Status: Full Code.  Family Communication: daughter at bedside.  Disposition Plan: pending further management.    Medical Consultants:    oncology   Procedures:   none Anti-Infectives:   Anti-infectives    Start     Dose/Rate Route Frequency Ordered Stop   08/08/15 1200  vancomycin (VANCOCIN) IVPB 750 mg/150 ml premix     750 mg 150  mL/hr over 60 Minutes Intravenous Every T-Th-Sa (Hemodialysis) 08/06/15 2110     08/07/15 1000  acyclovir (ZOVIRAX) 200 MG capsule 400 mg     400 mg Oral Daily 08/06/15 2035     08/06/15 2200  imipenem-cilastatin (PRIMAXIN) 250 mg in sodium chloride 0.9 % 100 mL IVPB     250 mg 200 mL/hr over 30 Minutes Intravenous Every 12 hours 08/06/15 2046     08/06/15 1945  ceFEPIme (MAXIPIME) 1 g in dextrose 5 % 50 mL IVPB  Status:  Discontinued     1 g 100 mL/hr over 30 Minutes Intravenous Every 24 hours 08/06/15 1930 08/06/15 2035   08/06/15 1945  vancomycin (VANCOCIN) 1,500 mg in sodium chloride 0.9 % 500 mL IVPB     1,500 mg 250 mL/hr over 120 Minutes Intravenous  Once 08/06/15 1930 08/06/15 2227      Subjective:    Zachari Barbary reports nausea, and vomited once, reports that's normal.  Objective:    Filed Vitals:   08/06/15 2015 08/06/15 2038 08/07/15 0519 08/07/15 1354  BP: 119/66 121/63 132/79 136/78  Pulse: 91 47 97 105  Temp:  98.4 F (36.9 C) 98.6 F (37 C) 99.5 F (37.5 C)  TempSrc:  Oral    Resp: 18 20 20 18   Height:  5' 5"  (1.651 m)    Weight:  67.3 kg (148 lb 5.9 oz)    SpO2: 99% 100% 96% 98%    Intake/Output Summary (Last 24 hours) at 08/07/15 1837 Last data filed at 08/07/15  1300  Gross per 24 hour  Intake    500 ml  Output      0 ml  Net    500 ml   Filed Weights   08/06/15 1718 08/06/15 2038  Weight: 69 kg (152 lb 1.9 oz) 67.3 kg (148 lb 5.9 oz)    Exam: General exam: Appears calm and comfortable. No distress.  Respiratory system: Clear to auscultation. Respiratory effort normal. Cardiovascular system: S1 & S2 heard, RRR. No JVD,  rubs, gallops or clicks. No murmurs. Gastrointestinal system: Abdomen is nondistended, soft and nontender. No organomegaly or masses felt. Normal bowel sounds heard. Central nervous system: Alert and oriented. No focal neurological deficits. Extremities: No clubbing, edema, or cyanosis. Skin: No rashes, lesions or  ulcers   Data Reviewed:   I have personally reviewed following labs and imaging studies:  Labs: Basic Metabolic Panel:  Recent Labs Lab 08/06/15 1321 08/06/15 1649 08/07/15 0530  NA 138 135 137  K 3.4* 3.3* 3.3*  CL  --  98* 106  CO2 30* 29 27  GLUCOSE 146* 121* 98  BUN 9.0 9 16  CREATININE 2.2* 2.59* 3.44*  CALCIUM 8.1* 7.7* 6.9*   GFR Estimated Creatinine Clearance: 24.8 mL/min (by C-G formula based on Cr of 3.44). Liver Function Tests:  Recent Labs Lab 08/06/15 1321 08/06/15 1649 08/07/15 0530  AST 9 17 12*  ALT <9 9* 6*  ALKPHOS 108 98 77  BILITOT 0.49 0.5 0.5  PROT 6.4 6.2* 4.9*  ALBUMIN 1.8* 1.9* 1.5*   No results for input(s): LIPASE, AMYLASE in the last 168 hours. No results for input(s): AMMONIA in the last 168 hours. Coagulation profile No results for input(s): INR, PROTIME in the last 168 hours.  CBC:  Recent Labs Lab 08/03/15 1248 08/06/15 1321 08/06/15 1726 08/07/15 0530  WBC 10.5* 11.4* 9.4 9.1  NEUTROABS 9.5* 9.7* 7.5  --   HGB 5.6* 9.1* 8.1* 7.4*  HCT 17.8* 27.6* 25.0* 23.2*  MCV 97.3 92.6 94.7 95.5  PLT 316 252 266 252   Cardiac Enzymes:  Recent Labs Lab 08/06/15 1726  TROPONINI <0.03   BNP (last 3 results) No results for input(s): PROBNP in the last 8760 hours. CBG: No results for input(s): GLUCAP in the last 168 hours. D-Dimer: No results for input(s): DDIMER in the last 72 hours. Hgb A1c: No results for input(s): HGBA1C in the last 72 hours. Lipid Profile: No results for input(s): CHOL, HDL, LDLCALC, TRIG, CHOLHDL, LDLDIRECT in the last 72 hours. Thyroid function studies: No results for input(s): TSH, T4TOTAL, T3FREE, THYROIDAB in the last 72 hours.  Invalid input(s): FREET3 Anemia work up: No results for input(s): VITAMINB12, FOLATE, FERRITIN, TIBC, IRON, RETICCTPCT in the last 72 hours. Sepsis Labs:  Recent Labs Lab 08/03/15 1248 08/06/15 1321 08/06/15 1726 08/06/15 1737 08/06/15 2016 08/07/15 0530  WBC  10.5* 11.4* 9.4  --   --  9.1  LATICACIDVEN  --   --   --  1.15 0.36*  --    Urine analysis:    Component Value Date/Time   COLORURINE RED* 08/06/2015 1830   APPEARANCEUR CLOUDY* 08/06/2015 1830   LABSPEC 1.012 08/06/2015 1830   LABSPEC 1.030 01/22/2015 1018   PHURINE 8.0 08/06/2015 1830   PHURINE 5.0 01/22/2015 1018   GLUCOSEU NEGATIVE 08/06/2015 1830   GLUCOSEU Negative 01/22/2015 1018   HGBUR LARGE* 08/06/2015 1830   HGBUR Large 01/22/2015 1018   [place] 08/06/2015 1830   BILIRUBINUR Negative 01/22/2015 1018   KETONESUR NEGATIVE  08/06/2015 1830   KETONESUR Negative 01/22/2015 1018   PROTEINUR >300* 08/06/2015 1830   [place] 01/22/2015 1018   UROBILINOGEN 0.2 01/22/2015 1018   UROBILINOGEN 0.2 01/31/2014 1050   NITRITE NEGATIVE 08/06/2015 1830   NITRITE Negative 01/22/2015 1018   LEUKOCYTESUR MODERATE* 08/06/2015 1830   LEUKOCYTESUR Color Interference due to blood in urine 01/22/2015 1018   Microbiology Recent Results (from the past 240 hour(s))  Urine culture     Status: Abnormal   Collection Time: 08/06/15  6:30 PM  Result Value Ref Range Status   Specimen Description URINE, CLEAN CATCH  Final   Special Requests NONE  Final   Culture MULTIPLE SPECIES PRESENT, SUGGEST RECOLLECTION (A)  Final   Report Status 08/07/2015 FINAL  Final  MRSA PCR Screening     Status: None   Collection Time: 08/07/15  6:30 AM  Result Value Ref Range Status   MRSA by PCR NEGATIVE NEGATIVE Final    Comment:        The GeneXpert MRSA Assay (FDA approved for NASAL specimens only), is one component of a comprehensive MRSA colonization surveillance program. It is not intended to diagnose MRSA infection nor to guide or monitor treatment for MRSA infections.     Radiology: Dg Chest 2 View  08/06/2015  CLINICAL DATA:  Fever. Chemotherapy treatment for multiple myeloma. Chronic kidney disease. EXAM: CHEST  2 VIEW COMPARISON:  07/07/2015 FINDINGS: Right internal jugular  central line has its tips in the right atrium. The heart is enlarged. There is venous hypertension with interstitial edema. There are bilateral pleural effusions with atelectasis in the dependent lungs. Basilar pneumonia not excluded. IMPRESSION: Dialysis catheter unchanged with the tip in the right atrium. Venous hypertension and interstitial edema. Bilateral dependent effusions with basilar atelectasis. Basilar pneumonia not excluded. Electronically Signed   By: Nelson Chimes M.D.   On: 08/06/2015 18:58    Medications:   . acyclovir  400 mg Oral Daily  . calcium acetate  1,334 mg Oral TID WC  . [START ON 08/08/2015] dexamethasone  20 mg Oral Once per day on Sun Sat  . feeding supplement (NEPRO CARB STEADY)  237 mL Oral BID BM  . feeding supplement (PRO-STAT SUGAR FREE 64)  30 mL Oral BID  . heparin  5,000 Units Subcutaneous Q8H  . hydrALAZINE  25 mg Oral TID  . imipenem-cilastatin  250 mg Intravenous Q12H  . metoprolol  50 mg Oral BID  . mirtazapine  15 mg Oral QHS  . [START ON 08/08/2015] vancomycin  750 mg Intravenous Q T,Th,Sa-HD   Continuous Infusions:   Time spent: 25 minutes.    LOS: 1 day   Zoeya Gramajo  [facility]   *Please refer to [email or web address], password [email or web address] to get updated schedule on who will round on this patient, as hospitalists switch teams weekly. If 7PM-7AM, please contact night-coverage at [email or web address], password [email or web address] for any overnight needs.  08/07/2015, 6:37 PM

## 2015-08-07 NOTE — Consult Note (Signed)
[place]  [phone] [phone]     ID: Charles [name] DOB: [birth year]  MR#: [id number]  DXI#:[id number]  Patient Care Team: Tresa Garter, MD as PCP - General (Internal Medicine) [name], NP as Nurse Practitioner (Hematology and Oncology) [name], MD as Consulting Physician (Oncology) PCP: [name], MD [name], MD OTHER MD:  CHIEF COMPLAINT: myeloma, renal failure  CURRENT TREATMENT: carfilzomib, dexamethasone; HD Tu/Th/Sa   RESEARCH PROTOCOL: no   INTERVAL HISTORY: Charles [name] underwent HD Thursday (yesterday) AM then came to our office for treatment, where he was found to be febrile to 102, w/o evident source. He was referred to the ED and admitted w presumed sepsis, pancultured and started on broad-spectrum ABX. --   REVIEW OF SYSTEMS: This AM Hansen tells me he feels "better." He is very stoic, does admit that HD makes him feel "wasted" for a day; the off days he feels better. He denies h/a, N/V, cough, SOB, pleurisy, or diarrhea. Has "some" dysuria. No complaint re HD catheter. No pain. Family member asleep in recliner  Allergies  Allergen Reactions  . Ciprofloxacin Rash  . Mobic [Meloxicam] Rash    Current Facility-Administered Medications  Medication Dose Route Frequency Provider Last Rate Last Dose  . acetaminophen (TYLENOL) tablet 650 mg  650 mg Oral Q6H PRN Edwin Dada, MD       Or  . acetaminophen (TYLENOL) suppository 650 mg  650 mg Rectal Q6H PRN Edwin Dada, MD      . acyclovir (ZOVIRAX) 200 MG capsule 400 mg  400 mg Oral Daily Edwin Dada, MD      . calcium acetate (PHOSLO) capsule 1,334 mg  1,334 mg Oral TID WC Edwin Dada, MD      . Derrill Memo ON 08/08/2015] dexamethasone (DECADRON) tablet 20 mg  20 mg Oral Once per day on Sun Sat Edwin Dada, MD      . feeding supplement (NEPRO CARB STEADY) liquid 237 mL  237 mL Oral BID BM  Edwin Dada, MD      . feeding supplement (PRO-STAT SUGAR FREE 64) liquid 30 mL  30 mL Oral BID Edwin Dada, MD   30 mL at 08/06/15 2240  . heparin injection 5,000 Units  5,000 Units Subcutaneous Q8H Edwin Dada, MD   5,000 Units at 08/07/15 0600  . hydrALAZINE (APRESOLINE) tablet 25 mg  25 mg Oral TID Edwin Dada, MD   25 mg at 08/06/15 2200  . imipenem-cilastatin (PRIMAXIN) 250 mg in sodium chloride 0.9 % 100 mL IVPB  250 mg Intravenous Q12H Edwin Dada, MD   250 mg at 08/07/15 0001  . metoprolol (LOPRESSOR) tablet 50 mg  50 mg Oral BID Edwin Dada, MD   50 mg at 08/06/15 2200  . mirtazapine (REMERON) tablet 15 mg  15 mg Oral QHS Edwin Dada, MD   15 mg at 08/06/15 2240  . promethazine (PHENERGAN) tablet 12.5 mg  12.5 mg Oral Q6H PRN Edwin Dada, MD      . senna-docusate (Senokot-S) tablet 1 tablet  1 tablet Oral QHS PRN Edwin Dada, MD      . traMADol Veatrice Bourbon) tablet 50 mg  50 mg Oral Q6H PRN Edwin Dada, MD      . Derrill Memo ON 08/08/2015] vancomycin (VANCOCIN) IVPB 750 mg/150 ml premix  750 mg Intravenous Q T,Th,Sa-HD Edwin Dada, MD        PAST  MEDICAL HISTORY: Past Medical History  Diagnosis Date  . Lytic bone lesions on xray   . Hypertension   . GERD (gastroesophageal reflux disease)   . Neuromuscular disorder ([place])     states both arms are shaking a lot  . ESRD (end stage renal disease) on dialysis (Charles [name])     [address] [facility] S (08/06/2015)  . Kidney stones   . History of blood transfusion 07/2015    "related to his kidneys"  . Headache     "monthly" (08/06/2015)  . IgG myeloma ([name])     PAST SURGICAL HISTORY: Past Surgical History  Procedure Laterality Date  . Insertion of dialysis catheter Right ~ 07/2015    chest  . Av fistula placement Left 07/13/2015    Procedure: CREATION OF LEFT UPPER ARM  ARTERIOVENOUS (AV) FISTULA ;  Surgeon: Elam Dutch, MD;  Location: [facility] OR;   Service: Vascular;  Laterality: Left;    FAMILY HISTORY Family History  Problem Relation Age of Onset  . Diabetes Father   The patient's parents are still living, in their late 60's. The patient has 2 brothers, 3 sisters. There is noc cancer in the fmaily to his knowledge  SOCIAL HISTORY:  Originally from [facility] [place], moved here 19 years ago. He works in Nurse, adult. Married (wife's name is [name]), lives in [place]. He has 5 children ages 63, 22, 77, 28 and 37 months [name], [name], [name] and Hawkeye) in good health. Best number to call him is [phone]    HEALTH MAINTENANCE: Social History  Substance Use Topics  . Smoking status: Never Smoker   . Smokeless tobacco: Never Used  . Alcohol Use: No    OBJECTIVE: young [name] man examined in bed Filed Vitals:   08/06/15 2038 08/07/15 0519  BP: 121/63 132/79  Pulse: 47 97  Temp: 98.4 F (36.9 C) 98.6 F (37 C)  Resp: 20 20     Body mass index is 24.69 kg/(m^2).      Lungs no rales or rhonchi, auscultated anterolaterally Heart regular rate and rhythm, no murmur appreciated Abd soft, nontender, positive bowel sounds Neuro: non-focal, well-oriented, appropriate affect Lines: IV Right forearm, HD cath Right ant upper chest both intact, good thrill Left arm fistula   LAB RESULTS:  CMP     Component Value Date/Time   NA 137 08/07/2015 0530   NA 138 08/06/2015 1321   K 3.3* 08/07/2015 0530   K 3.4* 08/06/2015 1321   CL 106 08/07/2015 0530   CO2 27 08/07/2015 0530   CO2 30* 08/06/2015 1321   GLUCOSE 98 08/07/2015 0530   GLUCOSE 146* 08/06/2015 1321   BUN 16 08/07/2015 0530   BUN 9.0 08/06/2015 1321   CREATININE 3.44* 08/07/2015 0530   CREATININE 2.2* 08/06/2015 1321   CALCIUM 6.9* 08/07/2015 0530   CALCIUM 8.1* 08/06/2015 1321   PROT 4.9* 08/07/2015 0530   PROT 6.4 08/06/2015 1321   PROT 4.6* 07/15/2015 1105   ALBUMIN 1.5* 08/07/2015 0530   ALBUMIN 1.8* 08/06/2015 1321   AST  12* 08/07/2015 0530   AST 9 08/06/2015 1321   ALT 6* 08/07/2015 0530   ALT <9 08/06/2015 1321   ALKPHOS 77 08/07/2015 0530   ALKPHOS 108 08/06/2015 1321   BILITOT 0.5 08/07/2015 0530   BILITOT 0.49 08/06/2015 1321   GFRNONAA 21* 08/07/2015 0530   GFRAA 24* 08/07/2015 0530    Lab Results  Component Value Date   KPAFRELGTCHN 145.75* 06/27/2015   LAMBDASER  8.32 06/27/2015   KAPLAMBRATIO 8.01* 07/15/2015    Lab Results  Component Value Date   TOTALPROTELP 6.2 06/27/2015   ALBUMINELP 1.3* 06/27/2015   A1GS 0.6* 06/27/2015   A2GS 1.6* 06/27/2015   BETS 0.6* 06/27/2015   BETA2SER 0.3 01/20/2015   GAMS 2.0* 06/27/2015   MSPIKE 1.8* 06/27/2015   SPEI Comment 06/27/2015    Lab Results  Component Value Date   WBC 9.1 08/07/2015   NEUTROABS 7.5 08/06/2015   HGB 7.4* 08/07/2015   HCT 23.2* 08/07/2015   MCV 95.5 08/07/2015   PLT 252 08/07/2015    '@LASTCHEMISTRY'$ @  No results found for: LABCA2  No components found for: LABCA125  No results for input(s): INR in the last 168 hours.  Urinalysis    Component Value Date/Time   COLORURINE RED* 08/06/2015 1830   APPEARANCEUR CLOUDY* 08/06/2015 1830   LABSPEC 1.012 08/06/2015 1830   LABSPEC 1.030 01/22/2015 1018   PHURINE 8.0 08/06/2015 1830   PHURINE 5.0 01/22/2015 1018   GLUCOSEU NEGATIVE 08/06/2015 1830   GLUCOSEU Negative 01/22/2015 1018   HGBUR LARGE* 08/06/2015 1830   HGBUR Large 01/22/2015 1018   BILIRUBINUR NEGATIVE 08/06/2015 1830   BILIRUBINUR Negative 01/22/2015 1018   KETONESUR NEGATIVE 08/06/2015 1830   KETONESUR Negative 01/22/2015 1018   PROTEINUR >300* 08/06/2015 1830   PROTEINUR 2000 01/22/2015 1018   UROBILINOGEN 0.2 01/22/2015 1018   UROBILINOGEN 0.2 01/31/2014 1050   NITRITE NEGATIVE 08/06/2015 1830   NITRITE Negative 01/22/2015 1018   LEUKOCYTESUR MODERATE* 08/06/2015 1830   LEUKOCYTESUR Color Interference due to blood in urine 01/22/2015 1018    RADIOLOGY AND OTHER STUDIES: Dg Chest 2  View  08/06/2015  CLINICAL DATA:  Fever. Chemotherapy treatment for multiple myeloma. Chronic kidney disease. EXAM: CHEST  2 VIEW COMPARISON:  07/07/2015 FINDINGS: Right internal jugular central line has its tips in the right atrium. The heart is enlarged. There is venous hypertension with interstitial edema. There are bilateral pleural effusions with atelectasis in the dependent lungs. Basilar pneumonia not excluded. IMPRESSION: Dialysis catheter unchanged with the tip in the right atrium. Venous hypertension and interstitial edema. Bilateral dependent effusions with basilar atelectasis. Basilar pneumonia not excluded. Electronically Signed   By: Nelson Chimes M.D.   On: 08/06/2015 18:58   Mr Cervical Spine Wo Contrast  07/09/2015  CLINICAL DATA:  Initial evaluation for right arm weakness. EXAM: MRI CERVICAL SPINE WITHOUT CONTRAST TECHNIQUE: Multiplanar, multisequence MR imaging of the cervical spine was performed. No intravenous contrast was administered. COMPARISON:  None. FINDINGS: Alignment: Ir to row bodies normally aligned with preservation of the normal cervical lordosis. No listhesis or malalignment. Vertebrae: Diffusely abnormal bone marrow with multiple osseous lesions present, consistent with known multiple myeloma. No extra osseous extension of tumor. No epidural tumor. No pathologic fracture. Cord: Signal intensity within the cervical spinal cord is normal. Posterior Fossa, vertebral arteries, paraspinal tissues: Visualized posterior fossa a grossly unremarkable. Changes related to known pressed are not well appreciated on this exam. Craniocervical junction normal. Paraspinous soft tissues within normal limits. No prevertebral or paravertebral edema. Normal intravascular flow voids present within the vertebral arteries bilaterally. Disc levels: C2-C3: Negative. C3-C4: Mild bilateral uncovertebral hypertrophy, left slightly worse than right. Mild left foraminal stenosis. No canal or right foraminal  narrowing. C4-C5: Mild degenerative disc bulge with bilateral uncovertebral hypertrophy. No significant canal stenosis. Minimal bilateral foraminal narrowing. C5-C6:  Minimal uncovertebral hypertrophy.  No stenosis. C6-C7:  Negative. C7-T1:  Negative. IMPRESSION: 1. Diffusely abnormal bone marrow, consistent with known  history of multiple myeloma. No extraosseous or epidural tumor. No pathologic fracture or other complication. 2. Relatively mild multilevel degenerative spondylolysis as above. No significant canal or foraminal stenosis. No findings to explain right upper extremity symptoms. Electronically Signed   By: Jeannine Boga M.D.   On: 07/09/2015 01:48      ASSESSMENT: 40 y.o. 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;   I: CyBorD started January 2016 (a) dexamethasone 20 mg/d every TU/WED started 02/04/2014 (b) bortezomib sQ days 1,4,8,11 of every 21 day cycle started 02/10/2014 (c) cyclophosphamide 300 mg/M2 IV weekly started 02/13/2014   2: Lenalidomide '10mg'$  daily started 07/26/2014, discontinued December 2016 with progression  (2) ESRD secondary to myeloma  (3) hypercalcemia: zolendronate started 02/10/2014-- repeat every 4 weeks initially, then every 12 weeks (a) changed to denosumab/Xgeva starting 02/26/2015 because of CKD, repeated every 12 weeks  (4) ID prophylaxis: (a) influenza and Pneumovax [PV13] vaccines 02/07/2014 (b) acyclovir started  02/07/2014 (c) HIV negative 01/31/2014 (d) Pneumovax P23 too be given after March 2016  (5). The patient is not a transplant candidate for financial reasons  (6) bilateral foot rash: Started on Septra DS and Diflucan 09/03/2014, resolved  (7) Multiple Myeloma relapse in December 2016.  (a) starting 02/05/15: dexamethasone '20mg'$  x2 consecutive days weekly; bortezomib sQ weekly,  (b) Septra DS twice daily (c) acyclovir '400mg'$  daily  (d) bortezomib/ dexamethasone discontinued after 05/06/2015 dose with evidence of progression  (8) started carfilzomib 05/28/2015, given with weekly low dose dexamethasone (a) tolerated cyclophosphamide poorly, discontinued after cycle 1 (b) carfilzomib started 05/28/2015 (i) cycle 2 started 07/01/2015 (ii) cycle 3 to start 08/06/2015 (c) taking dexamethasone 20 mg every Sunday and Monday (d) lenalidomide discontinued 07/29/2015  (9) on hemodialysis Tu/Th/Sa as of early June 2017  (10) adjuvant radiation completed 07/27/2015  (11) sepsis syndrome--day 2 imipenem/vancomycin  PLAN: [place] is currently AF The only focal complaint is mild dysuria. Urine and blood cultures are pending. He is on appropriate antibiotics.  He is due for dialysis SAT. Nephrology consult pending.  From a myeloma point of view his M-spike and more importantly his kappa light chains are down. I have stopped his lenalidomide because of concerns re counts with the added marrow damage from radiation. We are continuing dexamethasone Sun/Mon (20 mg/d) and for now are continuing carfilzomib--may change to a simpler regimen depending on further SPEP/ light chain results.  He has a f/u appt at the [facility] 08/12/2015 at 1415 PM  Hemodialysis is prolonging his life but not improving the QOL. It is not impossible but  unlikely that with further response to treatment renal function may improve sufficiently he could avoid HD-- more likely he will be on HD indefinitely.  At this point despite the complications, discomfort and poor functional status Clarence is not telling me he is ready to give up. Agree with full code status, and greatly appreciate the excellent care you are giving this unfortunate patient and his family. Marland Kitchen  [name], MD   08/07/2015 7:06 AM Medical Oncology and Hematology [facility] [address] [place], [place] [address] Tel. [phone]  Fax. [phone]

## 2015-08-08 DIAGNOSIS — C9002 Multiple myeloma in relapse: Secondary | ICD-10-CM

## 2015-08-08 LAB — BASIC METABOLIC PANEL
Anion gap: 6 (ref 5–15)
BUN: 21 mg/dL — AB (ref 6–20)
CALCIUM: 7.8 mg/dL — AB (ref 8.9–10.3)
CO2: 26 mmol/L (ref 22–32)
CREATININE: 3.77 mg/dL — AB (ref 0.61–1.24)
Chloride: 103 mmol/L (ref 101–111)
GFR calc Af Amer: 22 mL/min — ABNORMAL LOW (ref >60)
GFR, EST NON AFRICAN AMERICAN: 19 mL/min — AB (ref >60)
GLUCOSE: 133 mg/dL — AB (ref 65–99)
POTASSIUM: 3.3 mmol/L — AB (ref 3.5–5.1)
SODIUM: 135 mmol/L (ref 135–145)

## 2015-08-08 LAB — CBC
HCT: 27.4 % — ABNORMAL LOW (ref 39.0–52.0)
HEMOGLOBIN: 8.6 g/dL — AB (ref 13.0–17.0)
MCH: 29.6 pg (ref 26.0–34.0)
MCHC: 31.4 g/dL (ref 30.0–36.0)
MCV: 94.2 fL (ref 78.0–100.0)
PLATELETS: 258 10*3/uL (ref 150–400)
RBC: 2.91 MIL/uL — AB (ref 4.22–5.81)
RDW: 16 % — ABNORMAL HIGH (ref 11.5–15.5)
WBC: 3.5 10*3/uL — ABNORMAL LOW (ref 4.0–10.5)

## 2015-08-08 LAB — IRON AND TIBC
Iron: 19 ug/dL — ABNORMAL LOW (ref 45–182)
Saturation Ratios: 16 % — ABNORMAL LOW (ref 17.9–39.5)
TIBC: 118 ug/dL — ABNORMAL LOW (ref 250–450)
UIBC: 99 ug/dL

## 2015-08-08 LAB — PHOSPHORUS: Phosphorus: 2.8 mg/dL (ref 2.5–4.6)

## 2015-08-08 LAB — MAGNESIUM: MAGNESIUM: 1.7 mg/dL (ref 1.7–2.4)

## 2015-08-08 MED ORDER — VANCOMYCIN HCL IN DEXTROSE 750-5 MG/150ML-% IV SOLN
INTRAVENOUS | Status: AC
  Filled 2015-08-08: qty 150

## 2015-08-08 MED ORDER — DOXERCALCIFEROL 4 MCG/2ML IV SOLN
INTRAVENOUS | Status: AC
  Filled 2015-08-08: qty 2

## 2015-08-08 MED ORDER — DOXERCALCIFEROL 4 MCG/2ML IV SOLN
2.0000 ug | INTRAVENOUS | Status: DC
  Administered 2015-08-08: 2 ug via INTRAVENOUS
  Filled 2015-08-08: qty 2

## 2015-08-08 MED ORDER — SODIUM CHLORIDE 0.9 % IV SOLN
125.0000 mg | INTRAVENOUS | Status: DC
  Filled 2015-08-08: qty 10

## 2015-08-08 NOTE — Progress Notes (Signed)
Report called to RN 6E. Patient transferred to HD on bed.

## 2015-08-08 NOTE — Progress Notes (Signed)
Patient spouse informed of patient transfer to HD then [phone]

## 2015-08-08 NOTE — Progress Notes (Signed)
Progress Note    Charles [name]  GYK:[id number] DOB: September 05, 1975  DOA: 08/06/2015 PCP: [name], MD    Brief Narrative:   Charles [name] is an 40 y.o. 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 Jana Hakim who presents with fever and malaise. He was admitted for sepsis from possible UTI and health care associated pneumonia.   Assessment/Plan:   Principal Problem:   Fever Active Problems:   ESRD (end stage renal disease) on dialysis (HCC)   Hypoalbuminemia due to protein-calorie malnutrition (HCC)   HTN (hypertension), malignant  Sepsis of unclear source: Though ESRD, he makes urine and it appears to have moderate leukocytosis. cutlures show multiple bacterial morphotypes.  CXR shows interstitial edema.  Basilar pneumonia not excluded.  Started on IV antibiotics broad spectrum.  Blood cultures ordered and pending.  [place] oxygen as needed.  Wbc count normalized.    ESRD on HD: Further management as per renal. Plan for HD today. Appreciate renal recommendations.  HD on TTS.   Multiple myeloma: Further management as per oncology. Chemo on hold as his counts are low.  Outpatient follow up on 7/12 .  Chemo to resume when counts have improved.   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  Recheck cbc today.    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   Nausea and vomiting.  Symptomatic management.    Mild hypokalemia HD pt, would monitor.  Repeat labs today.   Deconditioned: get PT evaluation today.      Family Communication/Anticipated D/C date and plan/Code Status   DVT prophylaxis: Lovenox ordered. Code Status: Full Code.  Family Communication: wife  at bedside.  Disposition Plan: pending further management.    Medical  Consultants:    Oncology  Nephrology.    Procedures:   none Anti-Infectives:   Anti-infectives    Start     Dose/Rate Route Frequency Ordered Stop   08/08/15 1200  vancomycin (VANCOCIN) IVPB 750 mg/150 ml premix     750 mg 150 mL/hr over 60 Minutes Intravenous Every T-Th-Sa (Hemodialysis) 08/06/15 2110     08/07/15 1000  acyclovir (ZOVIRAX) 200 MG capsule 400 mg     400 mg Oral Daily 08/06/15 2035     08/06/15 2200  imipenem-cilastatin (PRIMAXIN) 250 mg in sodium chloride 0.9 % 100 mL IVPB     250 mg 200 mL/hr over 30 Minutes Intravenous Every 12 hours 08/06/15 2046     08/06/15 1945  ceFEPIme (MAXIPIME) 1 g in dextrose 5 % 50 mL IVPB  Status:  Discontinued     1 g 100 mL/hr over 30 Minutes Intravenous Every 24 hours 08/06/15 1930 08/06/15 2035   08/06/15 1945  vancomycin (VANCOCIN) 1,500 mg in sodium chloride 0.9 % 500 mL IVPB     1,500 mg 250 mL/hr over 120 Minutes Intravenous  Once 08/06/15 1930 08/06/15 2227      Subjective:    Charles [name] reports no nausea, or vomiting, or diarrhea. Reports feeling better. Family atbedside.   Objective:    Filed Vitals:   08/07/15 1354 08/07/15 2102 08/08/15 0637 08/08/15 1510  BP: 136/78 146/89 132/80 135/77  Pulse: 105 100 90 60  Temp: 99.5 F (37.5 C) 99.5 F (37.5 C) 98.4 F (36.9 C) 98.2 F (36.8 C)  TempSrc:  Resp: 18 18 18 16   Height:      Weight:      SpO2: 98% 99% 96% 98%    Intake/Output Summary (Last 24 hours) at 08/08/15 1737 Last data filed at 08/08/15 1304  Gross per 24 hour  Intake    480 ml  Output      0 ml  Net    480 ml   Filed Weights   08/06/15 1718 08/06/15 2038  Weight: 69 kg (152 lb 1.9 oz) 67.3 kg (148 lb 5.9 oz)    Exam: General exam: Appears calm and comfortable. No distress.  Respiratory system: Clear to auscultation. Respiratory effort normal. Cardiovascular system: S1 & S2 heard, RRR. No JVD,  rubs, gallops or clicks. No murmurs. Gastrointestinal system: Abdomen is  nondistended, soft and nontender. No organomegaly or masses felt. Normal bowel sounds heard. Central nervous system: Alert and oriented. No focal neurological deficits. Extremities: No clubbing, edema, or cyanosis. Skin: No rashes, lesions or ulcers   Data Reviewed:   I have personally reviewed following labs and imaging studies:  Labs: Basic Metabolic Panel:  Recent Labs Lab 08/06/15 1321 08/06/15 1649 08/07/15 0530  NA 138 135 137  K 3.4* 3.3* 3.3*  CL  --  98* 106  CO2 30* 29 27  GLUCOSE 146* 121* 98  BUN 9.0 9 16  CREATININE 2.2* 2.59* 3.44*  CALCIUM 8.1* 7.7* 6.9*   GFR Estimated Creatinine Clearance: 24.8 mL/min (by C-G formula based on Cr of 3.44). Liver Function Tests:  Recent Labs Lab 08/06/15 1321 08/06/15 1649 08/07/15 0530  AST 9 17 12*  ALT <9 9* 6*  ALKPHOS 108 98 77  BILITOT 0.49 0.5 0.5  PROT 6.4 6.2* 4.9*  ALBUMIN 1.8* 1.9* 1.5*   No results for input(s): LIPASE, AMYLASE in the last 168 hours. No results for input(s): AMMONIA in the last 168 hours. Coagulation profile No results for input(s): INR, PROTIME in the last 168 hours.  CBC:  Recent Labs Lab 08/03/15 1248 08/06/15 1321 08/06/15 1726 08/07/15 0530  WBC 10.5* 11.4* 9.4 9.1  NEUTROABS 9.5* 9.7* 7.5  --   HGB 5.6* 9.1* 8.1* 7.4*  HCT 17.8* 27.6* 25.0* 23.2*  MCV 97.3 92.6 94.7 95.5  PLT 316 252 266 252   Cardiac Enzymes:  Recent Labs Lab 08/06/15 1726  TROPONINI <0.03   BNP (last 3 results) No results for input(s): PROBNP in the last 8760 hours. CBG: No results for input(s): GLUCAP in the last 168 hours. D-Dimer: No results for input(s): DDIMER in the last 72 hours. Hgb A1c: No results for input(s): HGBA1C in the last 72 hours. Lipid Profile: No results for input(s): CHOL, HDL, LDLCALC, TRIG, CHOLHDL, LDLDIRECT in the last 72 hours. Thyroid function studies: No results for input(s): TSH, T4TOTAL, T3FREE, THYROIDAB in the last 72 hours.  Invalid input(s):  FREET3 Anemia work up: No results for input(s): VITAMINB12, FOLATE, FERRITIN, TIBC, IRON, RETICCTPCT in the last 72 hours. Sepsis Labs:  Recent Labs Lab 08/03/15 1248 08/06/15 1321 08/06/15 1726 08/06/15 1737 08/06/15 2016 08/07/15 0530  WBC 10.5* 11.4* 9.4  --   --  9.1  LATICACIDVEN  --   --   --  1.15 0.36*  --    Urine analysis:    Component Value Date/Time   COLORURINE RED* 08/06/2015 1830   APPEARANCEUR CLOUDY* 08/06/2015 1830   LABSPEC 1.012 08/06/2015 1830   LABSPEC 1.030 01/22/2015 1018   PHURINE 8.0 08/06/2015 1830   PHURINE 5.0 01/22/2015 1018  GLUCOSEU NEGATIVE 08/06/2015 1830   GLUCOSEU Negative 01/22/2015 1018   HGBUR LARGE* 08/06/2015 1830   HGBUR Large 01/22/2015 1018   [place] 08/06/2015 1830   BILIRUBINUR Negative 01/22/2015 1018   [place] 08/06/2015 1830   KETONESUR Negative 01/22/2015 1018   PROTEINUR >300* 08/06/2015 [place] 01/22/2015 1018   UROBILINOGEN 0.2 01/22/2015 1018   UROBILINOGEN 0.2 01/31/2014 1050   NITRITE NEGATIVE 08/06/2015 1830   NITRITE Negative 01/22/2015 1018   LEUKOCYTESUR MODERATE* 08/06/2015 1830   LEUKOCYTESUR Color Interference due to blood in urine 01/22/2015 1018   Microbiology Recent Results (from the past 240 hour(s))  Culture, blood (Routine X 2)     Status: None (Preliminary result)   Collection Time: 08/06/15  4:49 PM  Result Value Ref Range Status   Specimen Description BLOOD RIGHT HAND  Final   Special Requests BOTTLES DRAWN AEROBIC AND ANAEROBIC 5CC  Final   Culture NO GROWTH 2 DAYS  Final   Report Status PENDING  Incomplete  Culture, blood (Routine X 2)     Status: None (Preliminary result)   Collection Time: 08/06/15  5:26 PM  Result Value Ref Range Status   Specimen Description BLOOD RIGHT HAND  Final   Special Requests BOTTLES DRAWN AEROBIC AND ANAEROBIC 5CC  Final   Culture NO GROWTH 2 DAYS  Final   Report Status PENDING  Incomplete  Urine culture     Status:  Abnormal   Collection Time: 08/06/15  6:30 PM  Result Value Ref Range Status   Specimen Description URINE, CLEAN CATCH  Final   Special Requests NONE  Final   Culture MULTIPLE SPECIES PRESENT, SUGGEST RECOLLECTION (A)  Final   Report Status 08/07/2015 FINAL  Final  MRSA PCR Screening     Status: None   Collection Time: 08/07/15  6:30 AM  Result Value Ref Range Status   MRSA by PCR NEGATIVE NEGATIVE Final    Comment:        The GeneXpert MRSA Assay (FDA approved for NASAL specimens only), is one component of a comprehensive MRSA colonization surveillance program. It is not intended to diagnose MRSA infection nor to guide or monitor treatment for MRSA infections.     Radiology: Dg Chest 2 View  08/06/2015  CLINICAL DATA:  Fever. Chemotherapy treatment for multiple myeloma. Chronic kidney disease. EXAM: CHEST  2 VIEW COMPARISON:  07/07/2015 FINDINGS: Right internal jugular central line has its tips in the right atrium. The heart is enlarged. There is venous hypertension with interstitial edema. There are bilateral pleural effusions with atelectasis in the dependent lungs. Basilar pneumonia not excluded. IMPRESSION: Dialysis catheter unchanged with the tip in the right atrium. Venous hypertension and interstitial edema. Bilateral dependent effusions with basilar atelectasis. Basilar pneumonia not excluded. Electronically Signed   By: Nelson Chimes M.D.   On: 08/06/2015 18:58    Medications:   . acyclovir  400 mg Oral Daily  . calcium acetate  1,334 mg Oral TID WC  . dexamethasone  20 mg Oral Once per day on Sun Sat  . doxercalciferol  2 mcg Intravenous Q T,Th,Sa-HD  . feeding supplement (NEPRO CARB STEADY)  237 mL Oral BID BM  . feeding supplement (PRO-STAT SUGAR FREE 64)  30 mL Oral BID  . heparin  5,000 Units Subcutaneous Q8H  . hydrALAZINE  25 mg Oral TID  . imipenem-cilastatin  250 mg Intravenous Q12H  . metoprolol  50 mg Oral BID  . mirtazapine  15 mg Oral  QHS  . vancomycin   750 mg Intravenous Q T,Th,Sa-HD   Continuous Infusions:   Time spent: 25 minutes.    LOS: 2 days   [facility] [phone]  *Please refer to [email or web address], password [email or web address] to get updated schedule on who will round on this patient, as hospitalists switch teams weekly. If 7PM-7AM, please contact night-coverage at [email or web address], password [email or web address] for any overnight needs.  08/08/2015, 5:37 PM

## 2015-08-08 NOTE — Consult Note (Signed)
[facility] Renal Consultation Note    Indication for Consultation:  Management of ESRD/hemodialysis; anemia, hypertension/volume and secondary hyperparathyroidism PCP: [name], MD   HPI: Charles [name] is a 40 y.o. 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  He has been receiving Kyprolis/Revlimid chemotherapy and was found to have a fever when he presented for his chemo on 7/6 following his dialysis treatment.  Of note was he was afebrile pre and post HD 7/6  98.3 and 98.2 respectively.  Upon arrival to cancer center his temperature was 102.  He was admitted for further evaluation and treatment  CXR showed interstitial edema with pleural effusions, urine culture multiple species, BC pending no growth. WBC 11.4 down to 9.1  His post HD wt 7/7 was 58.6 (EDW 59 that was recently lowered) with a net UF of 0.6.  Post dialysis BP was 168/86 sitting and 138/95 standing P 93.  Hgbs have been in the mid to upper 6s - most recent was 6.7 6/22 with tsat 14% Fe 23 ferritin 2822. Post HD Hgb was 9.1 7/6, later 8.1 and 7.4 today His wife tells me he had a transfusion last week at the cancer center.   He is accompanied by his wife today who translates for me.  He feels a little better since coming to the hospital.  He vomited last night and had a little diarrhea.  He has no SOB, no dysuria, no edema or pain. He is weak. He is eating some. Past Medical History  Diagnosis Date  . Lytic bone lesions on xray   . Hypertension   . GERD (gastroesophageal reflux disease)   . Neuromuscular disorder ([place])     states both arms are shaking a lot  . ESRD (end stage renal disease) on dialysis ([place])     [address] [facility] (08/06/2015)  . Kidney stones   . History of blood transfusion 07/2015    "related to his kidneys"  . Headache     "monthly" (08/06/2015)  . IgG myeloma [facility])    Past Surgical History  Procedure Laterality Date  .  Insertion of dialysis catheter Right ~ 07/2015    chest  . Av fistula placement Left 07/13/2015    Procedure: CREATION OF LEFT UPPER ARM  ARTERIOVENOUS (AV) FISTULA ;  Surgeon: Elam Dutch, MD;  Location: [facility] OR;  Service: Vascular;  Laterality: Left;   Family History  Problem Relation Age of Onset  . Diabetes Father    Social History:  reports that he has never smoked. He has never used smokeless tobacco. He reports that he does not drink alcohol or use illicit drugs. Allergies  Allergen Reactions  . Ciprofloxacin Rash  . Mobic [Meloxicam] Rash   Prior to Admission medications   Medication Sig Start Date End Date Taking? Authorizing Provider  acyclovir (ZOVIRAX) 200 MG capsule Take 2 capsules (400 mg total) by mouth daily. 06/11/15  Yes [name], MD  calcium acetate (PHOSLO) 667 MG capsule Take 2 capsules (1,334 mg total) by mouth 3 (three) times daily with meals. 07/10/15  Yes Modena Jansky, MD  Cholecalciferol (VITAMIN D PO) Take 500 Units by mouth daily.   Yes Historical Provider, MD  dexamethasone (DECADRON) 4 MG tablet Take 5 tablets (70m) on Saturday and Sunday every week ([place]. 07/11/15  Yes GChauncey Cruel MD  hydrALAZINE (APRESOLINE) 25 MG  tablet Take 1 tablet (25 mg total) by mouth 3 (three) times daily. 07/10/15  Yes Modena Jansky, MD  metoprolol (LOPRESSOR) 50 MG tablet Take 1 tablet (50 mg total) by mouth 2 (two) times daily. 07/10/15  Yes Modena Jansky, MD  mirtazapine (REMERON) 15 MG tablet Take 1 tablet (15 mg total) by mouth at bedtime. 07/10/15  Yes Modena Jansky, MD  multivitamin (RENA-VIT) TABS tablet Take 1 tablet by mouth at bedtime. 07/10/15  Yes Modena Jansky, MD  Nutritional Supplements (FEEDING SUPPLEMENT, NEPRO CARB STEADY,) LIQD Take 237 mLs by mouth 2 (two) times daily between meals. 07/10/15  Yes Modena Jansky, MD  prochlorperazine (COMPAZINE) 10 MG tablet Take 1 tablet (10 mg total) by  mouth every 6 (six) hours as needed for nausea or vomiting. 06/11/15  Yes [name], MD  traMADol (ULTRAM) 50 MG tablet Take 1 tablet (50 mg total) by mouth every 6 (six) hours as needed. Patient taking differently: Take 50 mg by mouth every 6 (six) hours as needed for moderate pain.  06/11/15  Yes [name], MD  Amino Acids-Protein Hydrolys (FEEDING SUPPLEMENT, PRO-STAT SUGAR FREE 64,) LIQD Take 30 mLs by mouth 2 (two) times daily. 07/10/15   Modena Jansky, MD  polyethylene glycol (MIRALAX / GLYCOLAX) packet Take 17 g by mouth daily. Patient not taking: Reported on 07/27/2015 07/10/15   Modena Jansky, MD  senna-docusate (SENOKOT-S) 8.6-50 MG tablet Take 2 tablets by mouth 2 (two) times daily. Patient not taking: Reported on 07/27/2015 07/10/15   Modena Jansky, MD   Current Facility-Administered Medications  Medication Dose Route Frequency Provider Last Rate Last Dose  . acetaminophen (TYLENOL) tablet 650 mg  650 mg Oral Q6H PRN Edwin Dada, MD       Or  . acetaminophen (TYLENOL) suppository 650 mg  650 mg Rectal Q6H PRN Edwin Dada, MD      . acyclovir (ZOVIRAX) 200 MG capsule 400 mg  400 mg Oral Daily Edwin Dada, MD   400 mg at 08/08/15 0929  . calcium acetate (PHOSLO) capsule 1,334 mg  1,334 mg Oral TID WC Edwin Dada, MD   1,334 mg at 08/08/15 0929  . dexamethasone (DECADRON) tablet 20 mg  20 mg Oral Once per day on Sun Sat Edwin Dada, MD   20 mg at 08/08/15 2778  . feeding supplement (NEPRO CARB STEADY) liquid 237 mL  237 mL Oral BID BM Edwin Dada, MD   237 mL at 08/07/15 1000  . feeding supplement (PRO-STAT SUGAR FREE 64) liquid 30 mL  30 mL Oral BID Edwin Dada, MD   30 mL at 08/08/15 0929  . heparin injection 5,000 Units  5,000 Units Subcutaneous Q8H Edwin Dada, MD   5,000 Units at 08/08/15 0602  . hydrALAZINE (APRESOLINE) tablet 25 mg  25 mg Oral TID Edwin Dada, MD   25 mg at  08/08/15 0928  . imipenem-cilastatin (PRIMAXIN) 250 mg in sodium chloride 0.9 % 100 mL IVPB  250 mg Intravenous Q12H Edwin Dada, MD   250 mg at 08/08/15 0928  . metoprolol (LOPRESSOR) tablet 50 mg  50 mg Oral BID Edwin Dada, MD   50 mg at 08/08/15 2423  . mirtazapine (REMERON) tablet 15 mg  15 mg Oral QHS Edwin Dada, MD   15 mg at 08/07/15 2124  . promethazine (PHENERGAN) tablet 12.5 mg  12.5 mg Oral Q6H PRN Harrell Gave  Shirleen Schirmer, MD      . senna-docusate (Senokot-S) tablet 1 tablet  1 tablet Oral QHS PRN Edwin Dada, MD      . traMADol Veatrice Bourbon) tablet 50 mg  50 mg Oral Q6H PRN Edwin Dada, MD      . vancomycin (VANCOCIN) IVPB 750 mg/150 ml premix  750 mg Intravenous Q T,Th,Sa-HD Edwin Dada, MD       Labs: Basic Metabolic Panel:  Recent Labs Lab 08/06/15 1321 08/06/15 1649 08/07/15 0530  NA 138 135 137  K 3.4* 3.3* 3.3*  CL  --  98* 106  CO2 30* 29 27  GLUCOSE 146* 121* 98  BUN 9.0 9 16  CREATININE 2.2* 2.59* 3.44*  CALCIUM 8.1* 7.7* 6.9*   Liver Function Tests:  Recent Labs Lab 08/06/15 1321 08/06/15 1649 08/07/15 0530  AST 9 17 12*  ALT <9 9* 6*  ALKPHOS 108 98 77  BILITOT 0.49 0.5 0.5  PROT 6.4 6.2* 4.9*  ALBUMIN 1.8* 1.9* 1.5*   CBC:  Recent Labs Lab 08/03/15 1248 08/06/15 1321 08/06/15 1726 08/07/15 0530  WBC 10.5* 11.4* 9.4 9.1  NEUTROABS 9.5* 9.7* 7.5  --   HGB 5.6* 9.1* 8.1* 7.4*  HCT 17.8* 27.6* 25.0* 23.2*  MCV 97.3 92.6 94.7 95.5  PLT 316 252 266 252   Cardiac Enzymes:  Recent Labs Lab 08/06/15 1726  TROPONINI <0.03   Studies/Results: Dg Chest 2 View  08/06/2015  CLINICAL DATA:  Fever. Chemotherapy treatment for multiple myeloma. Chronic kidney disease. EXAM: CHEST  2 VIEW COMPARISON:  07/07/2015 FINDINGS: Right internal jugular central line has its tips in the right atrium. The heart is enlarged. There is venous hypertension with interstitial edema. There are bilateral pleural  effusions with atelectasis in the dependent lungs. Basilar pneumonia not excluded. IMPRESSION: Dialysis catheter unchanged with the tip in the right atrium. Venous hypertension and interstitial edema. Bilateral dependent effusions with basilar atelectasis. Basilar pneumonia not excluded. Electronically Signed   By: Nelson Chimes M.D.   On: 08/06/2015 18:58    ROS: As per HPI otherwise negative. Physical Exam: Filed Vitals:   08/07/15 0519 08/07/15 1354 08/07/15 2102 08/08/15 0637  BP: 132/79 136/78 146/89 132/80  Pulse: 97 105 100 90  Temp: 98.6 F (37 C) 99.5 F (37.5 C) 99.5 F (37.5 C) 98.4 F (36.9 C)  TempSrc:      Resp: 20 18 18 18   Height:      Weight:      SpO2: 96% 98% 99% 96%     General: slender ill appearing Hispanic male NAD Head: Normocephalic, atraumatic, sclera non-icteric, mucus membranes are moist Neck: no JVD Lungs: Clear bilaterally to auscultation without wheezes, rales, or rhonchi. Breathing is unlabored. Heart: RRR with S1 S2. No murmurs, rubs, Abdomen: Soft, non-tender, non-distended with normoactive bowel sounds. M-S:  Strength and tone appear normal for age. Lower extremities:without edema or ischemic changes, no open wounds  Neuro: Alert  Moves all extremities spontaneously. Dialysis Access: right IJ exit site nontender/no drainage and left upper AVF placed 6/12 not very impressive  Dialysis Orders: AF  TTS 4 hr 180 400/800 2 K 2.25 Ca EDW 59 - need to lower more right IJ hectorol 2 Aranesp 160 no heparin except in catheter Recent labs;  Hgb 6.7 6/29 14% sat Fe 23 ferritin 2822 iPTH 528 - last Aranesp 150 on 7/6  Assessment/Plan: 1.  Fevers- cultures pending ? UTI - abtx per primary/Vanc/primaxin 2.  ESRD -  TTS  HD today - no heparin except in catheter K 3.3 - start on 4 K bath today pending labs- K running low las check at HD unit may need 3 K bath at d/c 3.  Hypertension/volume  -gradually lowering weight - adjusting EDW He has rales on exam, 8kg over  dry wt and pulm edema on CXR, although he is not reporting any dyspnea and is not hypoxemic.  Plan HD today w max UF, get vol down.  Fluid restrict.  Transfer to renal floor if bed available.  4.  Anemia  - hgb 7.4 Had Aranesp 150 7/6 Fe deficiency  14% sat - has not been given Fe because of ^ ferritin - but that is most like ^ due to MM, repeat Fe studies with HD; since he had transfusion last week, they may be higher; next ESA due 7/13 -has been on Aranesp; transfusion prn - 5.  Metabolic bone disease -  continue hectorol 2/phoslo 6.  Nutrition - severe protein malnutrition - diet + prostat/renavite/nepro alb 1.5 7.  MM - per heme onc/resume chemo when fevers resolve and counts allow  Myriam Jacobson, PA-C [place] [phone] 08/08/2015, 12:44 PM   Pt seen, examined, agree w assess/plan as above with additions as indicated.  [name] MD [name] pager [phone]    cell [phone] 08/08/2015, 3:42 PM

## 2015-08-09 DIAGNOSIS — D649 Anemia, unspecified: Secondary | ICD-10-CM

## 2015-08-09 LAB — HEPATITIS B SURFACE ANTIGEN: Hepatitis B Surface Ag: NEGATIVE

## 2015-08-09 MED ORDER — FERROUS SULFATE 325 (65 FE) MG PO TABS
325.0000 mg | ORAL_TABLET | Freq: Two times a day (BID) | ORAL | Status: DC
  Administered 2015-08-09 – 2015-08-10 (×2): 325 mg via ORAL
  Filled 2015-08-09 (×2): qty 1

## 2015-08-09 NOTE — Progress Notes (Signed)
[facility] Progress Note   Subjective: no c/o, stocic  Filed Vitals:   08/08/15 2208 08/09/15 0444 08/09/15 1040 08/09/15 1731  BP: 108/57 121/83 125/55 106/63  Pulse: 92 89 86 87  Temp: 97.5 F (36.4 C) 98.4 F (36.9 C) 98.6 F (37 C) 98 F (36.7 C)  TempSrc: Oral Oral Oral Oral  Resp: '16 18 18 18  '$ Height:      Weight: 58.5 kg (128 lb 15.5 oz)     SpO2: 100% 97% 98% 98%    Inpatient medications: . acyclovir  400 mg Oral Daily  . calcium acetate  1,334 mg Oral TID WC  . dexamethasone  20 mg Oral Once per day on Sun Sat  . doxercalciferol  2 mcg Intravenous Q T,Th,Sa-HD  . feeding supplement (NEPRO CARB STEADY)  237 mL Oral BID BM  . feeding supplement (PRO-STAT SUGAR FREE 64)  30 mL Oral BID  . ferrous sulfate  325 mg Oral BID WC  . heparin  5,000 Units Subcutaneous Q8H  . hydrALAZINE  25 mg Oral TID  . imipenem-cilastatin  250 mg Intravenous Q12H  . metoprolol  50 mg Oral BID  . mirtazapine  15 mg Oral QHS  . vancomycin  750 mg Intravenous Q T,Th,Sa-HD     acetaminophen **OR** acetaminophen, promethazine, senna-docusate, traMADol  Exam: Withdrawn, pale, no distress No jvd Chest rales improved, clear on R , faint rales L base RRR no mrg Abd soft ntnd +bs no ascites No LE edema LUA AVF +bruit, not strong R IJ cath NF, ox 3  Dialysis: AF TTS  4h  2/2.25 bath  59kg (need to lower more) Hep none R IJ cath / L arm AVF (07/13/15) Hect 2ug, Aranesp 150 last on 7/6      Assessment: 1.  Fevers - unclear cause, +pyuria; blood cx's neg and urine cx mult species. CXR pulm edema, no pna. On emp abx.  Last admit was thought to possibly have fevers from the myeloma. Nothing on exam to explain fevers; has HD cath but neg blood cx's.   2.  ESRD started HD recently, due to mult myeloma 3.  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  Pulm edema - losing body wt, lower dry wt as tol. At dry wt today.    Plan - next HD Tuesday, lower dry wt   [name] MD [facility] pager [phone]    cell [phone] 08/09/2015, 5:50 PM    Recent Labs Lab 08/06/15 1649 08/07/15 0530 08/08/15 1850  NA 135 137 135  K 3.3* 3.3* 3.3*  CL 98* 106 103  CO2 '29 27 26  '$ GLUCOSE 121* 98 133*  BUN 9 16 21*  CREATININE 2.59* 3.44* 3.77*  CALCIUM 7.7* 6.9* 7.8*  PHOS  --   --  2.8    Recent Labs Lab 08/06/15 1321 08/06/15 1649 08/07/15 0530  AST 9 17 12*  ALT <9 9* 6*  ALKPHOS 108 98 77  BILITOT 0.49 0.5 0.5  PROT 6.4 6.2* 4.9*  ALBUMIN 1.8* 1.9* 1.5*    Recent Labs Lab 08/03/15 1248 08/06/15 1321 08/06/15 1726 08/07/15 0530 08/08/15 1850  WBC 10.5* 11.4* 9.4 9.1 3.5*  NEUTROABS 9.5* 9.7* 7.5  --   --   HGB 5.6* 9.1* 8.1* 7.4* 8.6*  HCT 17.8* 27.6* 25.0* 23.2* 27.4*  MCV 97.3 92.6 94.7 95.5 94.2  PLT 316 252 266 252 258  Iron/TIBC/Ferritin/ %Sat    Component Value Date/Time   IRON 19* 08/08/2015 1850   TIBC 118* 08/08/2015 1850   FERRITIN 2488* 07/04/2015 1319   IRONPCTSAT 16* 08/08/2015 1850

## 2015-08-09 NOTE — Progress Notes (Signed)
Progress Note    Charles [name]   NOB:[id number] DOB: May 26, 1975  DOA: 08/06/2015 PCP: [name], MD    Brief Narrative:   Charles [name] is an 40 y.o. 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 Jana Hakim who presents with fever and malaise. He was admitted for sepsis from possible UTI and health care associated pneumonia.   Assessment/Plan:   Principal Problem:   Fever Active Problems:   ESRD (end stage renal disease) on dialysis (HCC)   Hypoalbuminemia due to protein-calorie malnutrition (HCC)   HTN (hypertension), malignant  Sepsis of unclear source: Though ESRD, he makes urine and it appears to have moderate leukocytosis/pyuria, cutlures show multiple bacterial morphotypes.  CXR shows interstitial edema.  Basilar pneumonia not excluded.  Started on IV antibiotics broad spectrum.  Blood cultures ordered and negative so far. If cultures remain negative by tomorrow, will discharge pt on oral antibiotics.  [place] oxygen as needed.  Wbc count normalized.  Afebrile  Today.    ESRD on HD: Further management as per renal. Plan for HD today. Appreciate renal recommendations.  HD on TTS.   Multiple myeloma: Further management as per oncology. Chemo on hold as his counts are low.  Outpatient follow up on 7/12 .  Chemo to resume when counts have improved.   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  Recheck cbc shows hemoglobin of 8.6.  Iron studies show iron level of 19.  Iron supplements will be added.    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   Nausea and vomiting.  Symptomatic management.    Mild hypokalemia HD pt, would monitor.  Repeat labs today.   Deconditioned: get PT evaluation today. Recommended to get OOB.      Family  Communication/Anticipated D/C date and plan/Code Status   DVT prophylaxis: Lovenox ordered. Code Status: Full Code.  Family Communication: wife  at bedside.  Disposition Plan: pending pT EVAL.    Medical Consultants:    Oncology  Nephrology.    Procedures:   none Anti-Infectives:   Anti-infectives    Start     Dose/Rate Route Frequency Ordered Stop   08/08/15 1906  Vancomycin (VANCOCIN) 750-5 MG/150ML-% IVPB    Comments:  Charles [name], Charles [name]   : cabinet override      08/08/15 1906 08/08/15 2051   08/08/15 1200  vancomycin (VANCOCIN) IVPB 750 mg/150 ml premix     750 mg 150 mL/hr over 60 Minutes Intravenous Every T-Th-Sa (Hemodialysis) 08/06/15 2110     08/07/15 1000  acyclovir (ZOVIRAX) 200 MG capsule 400 mg     400 mg Oral Daily 08/06/15 2035     08/06/15 2200  imipenem-cilastatin (PRIMAXIN) 250 mg in sodium chloride 0.9 % 100 mL IVPB     250 mg 200 mL/hr over 30 Minutes Intravenous Every 12 hours 08/06/15 2046     08/06/15 1945  ceFEPIme (MAXIPIME) 1 g in dextrose 5 % 50 mL IVPB  Status:  Discontinued     1 g 100 mL/hr over 30 Minutes Intravenous Every 24 hours 08/06/15 1930 08/06/15 2035   08/06/15 1945  vancomycin (VANCOCIN) 1,500 mg in sodium chloride 0.9 % 500 mL IVPB     1,500 mg 250 mL/hr over 120 Minutes Intravenous  Once 08/06/15 1930 08/06/15 2227      Subjective:  Charles [name]  reports no nausea, or vomiting, or diarrhea. Reports feeling better. Family atbedside. Wants to go home, possibly tomorrow.   Objective:    Filed Vitals:   08/08/15 2208 08/09/15 0444 08/09/15 1040 08/09/15 1731  BP: 108/57 121/83 125/55 106/63  Pulse: 92 89 86 87  Temp: 97.5 F (36.4 C) 98.4 F (36.9 C) 98.6 F (37 C) 98 F (36.7 C)  TempSrc: Oral Oral Oral Oral  Resp: _0 Height:      Weight: 58.5 kg (128 lb 15.5 oz)     SpO2: 100% 97% 98% 98%    Intake/Output Summary (Last 24 hours) at 08/09/15 1850 Last data filed at 08/09/15 1300  Gross per 24  hour  Intake   1140 ml  Output   3500 ml  Net  -2360 ml   Filed Weights   08/08/15 1810 08/08/15 2148 08/08/15 2208  Weight: 62.1 kg (136 lb 14.5 oz) 58.6 kg (129 lb 3 oz) 58.5 kg (128 lb 15.5 oz)    Exam: General exam: Appears calm and comfortable. No distress.  Respiratory system: Clear to auscultation. Respiratory effort normal. Cardiovascular system: S1 & S2 heard, RRR. No JVD,  rubs, gallops or clicks. No murmurs. Gastrointestinal system: Abdomen is nondistended, soft and nontender. No organomegaly or masses felt. Normal bowel sounds heard. Central nervous system: Alert and oriented. No focal neurological deficits. Extremities: No clubbing, edema, or cyanosis. Skin: No rashes, lesions or ulcers   Data Reviewed:   I have personally reviewed following labs and imaging studies:  Labs: Basic Metabolic Panel:  Recent Labs Lab 08/06/15 1321  08/06/15 1649 08/07/15 0530 08/08/15 1850  NA 138  --  135 137 135  K 3.4*  < > 3.3* 3.3* 3.3*  CL  --   --  98* 106 103  CO2 30*  --  _1 GLUCOSE 146*  --  121* 98 133*  BUN 9.0  --  9 16 21*  CREATININE 2.2*  --  2.59* 3.44* 3.77*  CALCIUM 8.1*  --  7.7* 6.9* 7.8*  MG  --   --   --   --  1.7  PHOS  --   --   --   --  2.8  < > = values in this interval not displayed. GFR Estimated Creatinine Clearance: 21.6 mL/min (by C-G formula based on Cr of 3.77). Liver Function Tests:  Recent Labs Lab 08/06/15 1321 08/06/15 1649 08/07/15 0530  AST 9 17 12*  ALT <9 9* 6*  ALKPHOS 108 98 77  BILITOT 0.49 0.5 0.5  PROT 6.4 6.2* 4.9*  ALBUMIN 1.8* 1.9* 1.5*   No results for input(s): LIPASE, AMYLASE in the last 168 hours. No results for input(s): AMMONIA in the last 168 hours. Coagulation profile No results for input(s): INR, PROTIME in the last 168 hours.  CBC:  Recent Labs Lab 08/03/15 1248 08/06/15 1321 08/06/15 1726 08/07/15 0530 08/08/15 1850  WBC 10.5* 11.4* 9.4 9.1 3.5*  NEUTROABS 9.5* 9.7* 7.5  --   --     HGB 5.6* 9.1* 8.1* 7.4* 8.6*  HCT 17.8* 27.6* 25.0* 23.2* 27.4*  MCV 97.3 92.6 94.7 95.5 94.2  PLT 316 252 266 252 258   Cardiac Enzymes:  Recent Labs Lab 08/06/15 1726  TROPONINI <0.03   BNP (last 3 results) No results for input(s): PROBNP in the last 8760 hours. CBG: No results for input(s): GLUCAP in the last 168 hours. D-Dimer: No results for input(s):  DDIMER in the last 72 hours. Hgb A1c: No results for input(s): HGBA1C in the last 72 hours. Lipid Profile: No results for input(s): CHOL, HDL, LDLCALC, TRIG, CHOLHDL, LDLDIRECT in the last 72 hours. Thyroid function studies: No results for input(s): TSH, T4TOTAL, T3FREE, THYROIDAB in the last 72 hours.  Invalid input(s): FREET3 Anemia work up:  Recent Labs  08/08/15 1850  TIBC 118*  IRON 19*   Sepsis Labs:  Recent Labs Lab 08/06/15 1321 08/06/15 1726 08/06/15 1737 08/06/15 2016 08/07/15 0530 08/08/15 1850  WBC 11.4* 9.4  --   --  9.1 3.5*  LATICACIDVEN  --   --  1.15 0.36*  --   --    Urine analysis:    Component Value Date/Time   COLORURINE RED* 08/06/2015 1830   APPEARANCEUR CLOUDY* 08/06/2015 1830   LABSPEC 1.012 08/06/2015 1830   LABSPEC 1.030 01/22/2015 1018   PHURINE 8.0 08/06/2015 1830   PHURINE 5.0 01/22/2015 1018   GLUCOSEU NEGATIVE 08/06/2015 1830   GLUCOSEU Negative 01/22/2015 1018   HGBUR LARGE* 08/06/2015 1830   HGBUR Large 01/22/2015 1018   BILIRUBINUR NEGATIVE 08/06/2015 1830   BILIRUBINUR Negative 01/22/2015 1018   [place] 08/06/2015 1830   KETONESUR Negative 01/22/2015 1018   PROTEINUR >300* 08/06/2015 1830   PROTEINUR 2000 01/22/2015 1018   UROBILINOGEN 0.2 01/22/2015 1018   UROBILINOGEN 0.2 01/31/2014 1050   NITRITE NEGATIVE 08/06/2015 1830   NITRITE Negative 01/22/2015 1018   LEUKOCYTESUR MODERATE* 08/06/2015 1830   LEUKOCYTESUR Color Interference due to blood in urine 01/22/2015 1018   Microbiology Recent Results (from the past 240 hour(s))  Culture, blood  (Routine X 2)     Status: None (Preliminary result)   Collection Time: 08/06/15  4:49 PM  Result Value Ref Range Status   Specimen Description BLOOD RIGHT HAND  Final   Special Requests BOTTLES DRAWN AEROBIC AND ANAEROBIC 5CC  Final   Culture NO GROWTH 3 DAYS  Final   Report Status PENDING  Incomplete  Culture, blood (Routine X 2)     Status: None (Preliminary result)   Collection Time: 08/06/15  5:26 PM  Result Value Ref Range Status   Specimen Description BLOOD RIGHT HAND  Final   Special Requests BOTTLES DRAWN AEROBIC AND ANAEROBIC 5CC  Final   Culture NO GROWTH 3 DAYS  Final   Report Status PENDING  Incomplete  Urine culture     Status: Abnormal   Collection Time: 08/06/15  6:30 PM  Result Value Ref Range Status   Specimen Description URINE, CLEAN CATCH  Final   Special Requests NONE  Final   Culture MULTIPLE SPECIES PRESENT, SUGGEST RECOLLECTION (A)  Final   Report Status 08/07/2015 FINAL  Final  MRSA PCR Screening     Status: None   Collection Time: 08/07/15  6:30 AM  Result Value Ref Range Status   MRSA by PCR NEGATIVE NEGATIVE Final    Comment:        The GeneXpert MRSA Assay (FDA approved for NASAL specimens only), is one component of a comprehensive MRSA colonization surveillance program. It is not intended to diagnose MRSA infection nor to guide or monitor treatment for MRSA infections.     Radiology: No results found.  Medications:   . acyclovir  400 mg Oral Daily  . calcium acetate  1,334 mg Oral TID WC  . dexamethasone  20 mg Oral Once per day on Sun Sat  . doxercalciferol  2 mcg Intravenous Q T,Th,Sa-HD  . feeding supplement (  NEPRO CARB STEADY)  237 mL Oral BID BM  . feeding supplement (PRO-STAT SUGAR FREE 64)  30 mL Oral BID  . ferrous sulfate  325 mg Oral BID WC  . heparin  5,000 Units Subcutaneous Q8H  . hydrALAZINE  25 mg Oral TID  . imipenem-cilastatin  250 mg Intravenous Q12H  . metoprolol  50 mg Oral BID  . mirtazapine  15 mg Oral QHS  .  vancomycin  750 mg Intravenous Q T,Th,Sa-HD   Continuous Infusions:   Time spent: 25 minutes.    LOS: 3 days   [facility] [phone]  *Please refer to [email or web address], password [email or web address] to get updated schedule on who will round on this patient, as hospitalists switch teams weekly. If 7PM-7AM, please contact night-coverage at [email or web address], password [email or web address] for any overnight needs.  08/09/2015, 6:50 PM

## 2015-08-10 ENCOUNTER — Telehealth: Payer: Self-pay | Admitting: Oncology

## 2015-08-10 LAB — BASIC METABOLIC PANEL
Anion gap: 8 (ref 5–15)
BUN: 52 mg/dL — AB (ref 6–20)
CALCIUM: 7.4 mg/dL — AB (ref 8.9–10.3)
CHLORIDE: 102 mmol/L (ref 101–111)
CO2: 27 mmol/L (ref 22–32)
Creatinine, Ser: 5.68 mg/dL — ABNORMAL HIGH (ref 0.61–1.24)
GFR calc Af Amer: 13 mL/min — ABNORMAL LOW (ref >60)
GFR calc non Af Amer: 11 mL/min — ABNORMAL LOW (ref >60)
Glucose, Bld: 97 mg/dL (ref 65–99)
Potassium: 3.3 mmol/L — ABNORMAL LOW (ref 3.5–5.1)
Sodium: 137 mmol/L (ref 135–145)

## 2015-08-10 LAB — CBC
HCT: 24.7 % — ABNORMAL LOW (ref 39.0–52.0)
Hemoglobin: 8.1 g/dL — ABNORMAL LOW (ref 13.0–17.0)
MCH: 30.9 pg (ref 26.0–34.0)
MCHC: 32.8 g/dL (ref 30.0–36.0)
MCV: 94.3 fL (ref 78.0–100.0)
Platelets: 272 K/uL (ref 150–400)
RBC: 2.62 MIL/uL — ABNORMAL LOW (ref 4.22–5.81)
RDW: 16.6 % — ABNORMAL HIGH (ref 11.5–15.5)
WBC: 9.8 K/uL (ref 4.0–10.5)

## 2015-08-10 MED ORDER — CALCIUM ACETATE (PHOS BINDER) 667 MG PO CAPS: 667.0000 mg | ORAL_CAPSULE | Freq: Three times a day (TID) | ORAL | Status: DC

## 2015-08-10 MED ORDER — AMOXICILLIN-POT CLAVULANATE 500-125 MG PO TABS: 1.0000 | ORAL_TABLET | Freq: Three times a day (TID) | ORAL | Status: DC

## 2015-08-10 MED ORDER — FERROUS SULFATE 325 (65 FE) MG PO TABS: 325.0000 mg | ORAL_TABLET | Freq: Two times a day (BID) | ORAL | Status: DC

## 2015-08-10 MED ORDER — AMOXICILLIN-POT CLAVULANATE 500-125 MG PO TABS: 1.0000 | ORAL_TABLET | Freq: Every day | ORAL | Status: DC

## 2015-08-10 MED ORDER — AMOXICILLIN-POT CLAVULANATE 875-125 MG PO TABS: 1.0000 | ORAL_TABLET | Freq: Two times a day (BID) | ORAL | Status: DC

## 2015-08-10 MED ORDER — SODIUM CHLORIDE 0.9 % IV SOLN
500.0000 mg | Freq: Two times a day (BID) | INTRAVENOUS | Status: DC
  Filled 2015-08-10: qty 500

## 2015-08-10 MED ORDER — SODIUM CHLORIDE 0.9 % IV SOLN: 125.0000 mg | INTRAVENOUS | Status: DC

## 2015-08-10 MED FILL — AMOX-CLAV 500-125 MG TABLET: 500-125 | 3 days supply | Qty: 3 | Fill #0

## 2015-08-10 NOTE — Progress Notes (Addendum)
Pharmacy Antibiotic Note  Charles [name] is a 40 y.o. male admitted on 08/06/2015 with pneumonia.  Pharmacy has been consulted for Primaxin and Vancomycin dosing.  Undergoing treatment for multiple myeloma, ESRD with HD on TTSat- last HD session on 7/8. History of ESBL UTI.  Plan: Primaxin 514m IV q12h Vancomycin 7568mIV qHD-TTSat Follow up HD schedule/tolerance, clinical progression, LOT   Height: _0  (165.1 cm) Weight: 128 lb 15.5 oz (58.5 kg) IBW/kg (Calculated) : 61.5  Temp (24hrs), Avg:97.9 F (36.6 C), Min:97.8 F (36.6 C), Max:98 F (36.7 C)   Recent Labs Lab 08/06/15 1321 08/06/15 1649 08/06/15 1726 08/06/15 1737 08/06/15 2016 08/07/15 0530 08/08/15 1850 08/10/15 1127  WBC 11.4*  --  9.4  --   --  9.1 3.5* 9.8  CREATININE 2.2* 2.59*  --   --   --  3.44* 3.77* 5.68*  LATICACIDVEN  --   --   --  1.15 0.36*  --   --   --     Estimated Creatinine Clearance: 14.3 mL/min (by C-G formula based on Cr of 5.68).    Allergies  Allergen Reactions  . Ciprofloxacin Rash  . Mobic [Meloxicam] Rash   Antibiotics this admission: Vanc 7/6 >> Primaxin 7/7 >>  Dose changes: N/a  Microbiology: 7/6 UCx: mult species 7/6 BCx: ngtd   Charles [name], PharmD, BCPS Clinical Pharmacist Pager: [phone]/11/2015 1:53 PM   ADDENDUM Noted plans to discharge patient on Augmentin. Paged Dr. AkKarleen Hampshireo change dose to 500/12527mO qhs for ESRD dosing- she called me back and stated she would change the order  Charles [name], PharmD, BCPS Clinical Pharmacist Pager: [phone]/2017 3:23 PM

## 2015-08-10 NOTE — Care Management Note (Addendum)
Case Management Note  Patient Details  Name: Charles [name] MRN: [id number] Date of Birth: 1975/07/24  Subjective/Objective:       CM following for progression and d/c planning.             Action/Plan: 08/10/2015 Noted MD order for HHPT and [place], however this pt has medicaid only which covers HD. He does not qualify for HHPT and no HHRN needs can be justified. This pt is seen multiple times weekly by skilled providers at HD centers and at the cancer center. At this point there is no identifiable skilled needs for the pt at home.  Will continue to follow.  Expected Discharge Date:   08/10/2015               Expected Discharge Plan:  Home/Self Care  In-House Referral:  NA  Discharge planning Services  CM Consult  Post Acute Care Choice:  NA Choice offered to:  NA  DME Arranged:  N/A DME Agency:  NA  HH Arranged:  NA HH Agency:   NA  Status of Service:  Completed, signed off  If discussed at [place] of Stay Meetings, dates discussed:    Additional Comments:  Adron Bene, RN 08/10/2015, 4:33 PM

## 2015-08-10 NOTE — Progress Notes (Signed)
Discharge instructions and medications discussed with patient and wife via [place], [place].  All questions answered.

## 2015-08-10 NOTE — Progress Notes (Signed)
[facility] Progress Note  Assessment/Plan: 1. Fevers- cultures pending ? UTI multiple species vs MM  IV abtx changedto  augmentin 875 bid empirically - no growth on BC - this dose is  too high for a renal patient - need to reduce dose for discharge- have contacted pharmacy to redose 2. ESRD - TTS HD  -  K 3.3 - change to 3 K bath at discharge 3. Hypertension/volume -gradually lowering weight - adjusting EDW Had net UF 3.5 on 7/8 with post weight 58.5- which is 0.5 below edw; he was not 8 kg above EDW -floor weights were incorrect - continue to titrate down- lower 58 at d/c 4. Anemia - hgb 8.1  Had Aranesp 150 7/6 Fe deficiency 14% sat - has not been given Fe because of ^ ferritin - but that is most like ^ due to MM, repeat had transfusion last week,tsat still low at 16% ; next ESA due 7/13 -has been on Aranesp; replete Fe with HD- HE really does not need oral Fe - I will tell him to stop - we will give IV Fe at outpt HD 5. Metabolic bone disease - continue hectorol 2/phoslo P 2.8- 6. Nutrition - severe protein malnutrition - diet + prostat/renavite/nepro alb 1.5 7. MM - per heme onc/resume chemo when fevers resolve and counts allow  Myriam Jacobson, PA-C [place] [facility] [phone] 08/10/2015,2:58 PM  LOS: 4 days   Subjective:   Going home  Objective Filed Vitals:   08/09/15 1731 08/09/15 2209 08/10/15 0635 08/10/15 0831  BP: 106/63 99/63 120/81 114/75  Pulse: 87 72 79 91  Temp: 98 F (36.7 C) 97.8 F (36.6 C) 98 F (36.7 C) 97.8 F (36.6 C)  TempSrc: Oral Oral Oral Oral  Resp: '18 18 18 17  '$ Height:      Weight:      SpO2: 98% 98% 98% 98%   Physical Exam General: ill appearing Heart: RRR Lungs: no rales appreciated Abdomen: soft NT active BS Extremities: no LE edema Dialysis Access: right IJ and left upper AVF maturing  Dialysis Orders: AF TTS 4 hr 180 400/800 2 K 2.25 Ca EDW 59 - need to lower more right IJ hectorol 2 Aranesp 160 no  heparin except in catheter Recent labs; Hgb 6.7 6/29 14% sat Fe 23 ferritin 2822 iPTH 528 - last Aranesp 150 on 7/6  Additional Objective Labs: Basic Metabolic Panel:  Recent Labs Lab 08/07/15 0530 08/08/15 1850 08/10/15 1127  NA 137 135 137  K 3.3* 3.3* 3.3*  CL 106 103 102  CO2 '27 26 27  '$ GLUCOSE 98 133* 97  BUN 16 21* 52*  CREATININE 3.44* 3.77* 5.68*  CALCIUM 6.9* 7.8* 7.4*  PHOS  --  2.8  --    Liver Function Tests:  Recent Labs Lab 08/06/15 1321 08/06/15 1649 08/07/15 0530  AST 9 17 12*  ALT <9 9* 6*  ALKPHOS 108 98 77  BILITOT 0.49 0.5 0.5  PROT 6.4 6.2* 4.9*  ALBUMIN 1.8* 1.9* 1.5*   CBC:  Recent Labs Lab 08/06/15 1321  08/06/15 1726 08/07/15 0530 08/08/15 1850 08/10/15 1127  WBC 11.4*  < > 9.4 9.1 3.5* 9.8  NEUTROABS 9.7*  --  7.5  --   --   --   HGB 9.1*  < > 8.1* 7.4* 8.6* 8.1*  HCT 27.6*  < > 25.0* 23.2* 27.4* 24.7*  MCV 92.6  --  94.7 95.5 94.2 94.3  PLT 252  < > 266  252 258 272  < > = values in this interval not displayed. Blood Culture    Component Value Date/Time   SDES URINE, CLEAN CATCH 08/06/2015 1830   SPECREQUEST NONE 08/06/2015 1830   CULT MULTIPLE SPECIES PRESENT, SUGGEST RECOLLECTION* 08/06/2015 1830   REPTSTATUS 08/07/2015 FINAL 08/06/2015 1830    Cardiac Enzymes:  Recent Labs Lab 08/06/15 1726  TROPONINI <0.03   Iron Studies:  Recent Labs  08/08/15 1850  IRON 19*  TIBC 118*   Medications:   . acyclovir  400 mg Oral Daily  . calcium acetate  1,334 mg Oral TID WC  . dexamethasone  20 mg Oral Once per day on Sun Sat  . doxercalciferol  2 mcg Intravenous Q T,Th,Sa-HD  . feeding supplement (NEPRO CARB STEADY)  237 mL Oral BID BM  . feeding supplement (PRO-STAT SUGAR FREE 64)  30 mL Oral BID  . ferrous sulfate  325 mg Oral BID WC  . heparin  5,000 Units Subcutaneous Q8H  . hydrALAZINE  25 mg Oral TID  . imipenem-cilastatin  500 mg Intravenous Q12H  . metoprolol  50 mg Oral BID  . mirtazapine  15 mg Oral QHS   . vancomycin  750 mg Intravenous Q T,Th,Sa-HD

## 2015-08-10 NOTE — Evaluation (Signed)
Physical Therapy Evaluation Patient Details Name: Charles [name] MRN: [id number] DOB: 09-11-1975 Today's Date: 08/10/2015   History of Present Illness  40 yo male with onset of fever and chills was found to have sepsis and UTI with possible PNA per MD.  PMHx:  kidney myeloma, MM, ESRD with HD,   Clinical Impression  Pt was seen for evaluation of his mobility and to plan for home.  He is now demonstrating a need for [facility] possibly to steady himself but will make this determination for next visit.  He is in need of HHPT to strengthen as well, having had many medical issues begin to pile up for him.  Will follow acutely for one more visit at least to determine his need for [facility] vs no AD.    Follow Up Recommendations Home health PT;Supervision for mobility/OOB    Equipment Recommendations  Other (comment) (will use [facility] next visit for determination of need)    Recommendations for Other Services Rehab consult     Precautions / Restrictions Precautions Precautions: Fall Restrictions Weight Bearing Restrictions: No      Mobility  Bed Mobility Overal bed mobility: Needs Assistance Bed Mobility: Supine to Sit;Sit to Supine     Supine to sit: Min guard Sit to supine: Min guard   General bed mobility comments: fairly level bed and using bedrail  Transfers Overall transfer level: Needs assistance Equipment used: 1 person hand held assist Transfers: Sit to/from [facility] Sit to Stand: Min guard;Min assist Stand pivot transfers: Min guard;Min assist       General transfer comment: minor support as pt is shaky when he initally stands  Ambulation/Gait Ambulation/Gait assistance: Min guard;Min assist Ambulation Distance (Feet): 200 Feet Assistive device: 1 person hand held assist Gait Pattern/deviations: Step-through pattern;Wide base of support;Narrow base of support;Decreased stride length Gait velocity: reduced Gait velocity interpretation: Below normal  speed for age/gender General Gait Details: Pt is minimally unsteady but min assist will control his balance, no AD used  Stairs            Wheelchair Mobility    Modified Rankin (Stroke Patients Only)       Balance Overall balance assessment: Needs assistance Sitting-balance support: Feet supported Sitting balance-Leahy Scale: Good     Standing balance support: Single extremity supported Standing balance-Leahy Scale: Fair Standing balance comment: less than fair dynamically                             Pertinent Vitals/Pain Pain Assessment: No/denies pain    Home Living Family/patient expects to be discharged to:: Private residence Living Arrangements: Spouse/significant other;Children Available Help at Discharge: Family Type of Home: House Home Access: Stairs to enter Entrance Stairs-Rails: None Technical brewer of Steps: 2 Home Layout: One level Home Equipment: None      Prior Function Level of Independence: Independent               Hand Dominance   Dominant Hand: Right    Extremity/Trunk Assessment   Upper Extremity Assessment: Generalized weakness           Lower Extremity Assessment: Generalized weakness      Cervical / Trunk Assessment: Normal  Communication   Communication: Other (comment) (speaks both English and [place])  Cognition Arousal/Alertness: Awake/alert Behavior During Therapy: WFL for tasks assessed/performed Overall Cognitive Status: Within Functional Limits for tasks assessed  General Comments General comments (skin integrity, edema, etc.): Pt is up to move with PT feeling feverish and shaky, took him through a level surface with no major loss of balance but is also appropriate for testing on AD as needed.    Exercises        Assessment/Plan    PT Assessment Patient needs continued PT services  PT Diagnosis Difficulty walking;Generalized weakness   PT Problem  List Decreased strength;Decreased range of motion;Decreased activity tolerance;Decreased balance;Decreased mobility;Decreased coordination;Decreased safety awareness  PT Treatment Interventions DME instruction;Gait training;Stair training;Functional mobility training;Therapeutic activities;Therapeutic exercise;Balance training;Neuromuscular re-education;Patient/family education   PT Goals (Current goals can be found in the Care Plan section) Acute Rehab PT Goals Patient Stated Goal: To feel better PT Goal Formulation: With patient Time For Goal Achievement: 08/17/15 Potential to Achieve Goals: Good    Frequency Min 2X/week   Barriers to discharge Inaccessible home environment (stairs with no rails) may need AD for home now    Co-evaluation               End of Session Equipment Utilized During Treatment: Gait belt Activity Tolerance: Patient tolerated treatment well;Patient limited by fatigue;Patient limited by lethargy Patient left: in bed;with call bell/phone within reach;with bed alarm set Nurse Communication: Mobility status         Time: 4401-0272 PT Time Calculation (min) (ACUTE ONLY): 21 min   Charges:   PT Evaluation $PT Eval Moderate Complexity: 1 Procedure     PT G CodesRamond Dial Aug 18, 2015, 12:34 PM    Mee Hives, PT MS Acute Rehab Dept. Number: [place]

## 2015-08-10 NOTE — Telephone Encounter (Signed)
Per 7/7 pof from CB patient needs f/u with GM 7/12 prior to chemo. Per GM schedule with [facility] 7/12 for 60 minutes. Added f/u with [facility] for 1:15 pm and adjusted lab time to 12:45 pm. Left message for patient/wife re new time for 7/12 @ 12:45 pm.

## 2015-08-11 LAB — CULTURE, BLOOD (ROUTINE X 2)
CULTURE: NO GROWTH
CULTURE: NO GROWTH

## 2015-08-11 NOTE — Discharge Summary (Addendum)
Physician Discharge Summary  Charles [name] QTM:[id number] DOB: Jul 17, 1975 DOA: 08/06/2015  PCP: [name], MD  Admit date: 08/06/2015 Discharge date: 08/10/2015  Admitted From: HOme. Disposition:  Home   Recommendations for Outpatient Follow-up:  1. Follow up with PCP in 1-2 weeks 2. Please obtain BMP/CBC in one week 3. Please follow up wth oncology as recommended.    Discharge Condition:guarded CODE STATUS:full code.  Diet recommendation: Renal diet.   Brief/Interim Summary: Charles [name] is an 40 y.o. 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 Jana Hakim who presents with fever and malaise. He was admitted for sepsis from possible UTI and health care associated pneumonia.  Discharge Diagnoses:  Principal Problem:   Fever Active Problems:   ESRD (end stage renal disease) on dialysis (HCC)   Hypoalbuminemia due to protein-calorie malnutrition (HCC)   HTN (hypertension), malignant  SIRS/ fevers:  Unclear etiology, ? Possibly from MM itself.  - so far cultures have been negative. He has completed 5 days of broad spectrum IV antibiotics and he has been afebrile and no leukocytosis.  - he appears non toxic .  - discontinued IV antibiotics and  discharged him on oral augmentin to complete the course, assuming its pneumonia and nephrology assisted with the dosage of the antibiotics on discharge.    Hypertension: Controlled.    ESRD:  Further management as per renal.    Multiple Myeloma: Further management as per Dr Curlene Labrum.  Plan to resume chemo once counts have improved.   Discharge Instructions  Discharge Instructions    Diet - low sodium heart healthy    Complete by:  As directed      Discharge instructions    Complete by:  As directed   Please follow up with oncology as recommended.            Medication List    STOP taking  these medications        polyethylene glycol packet  Commonly known as:  MIRALAX / GLYCOLAX      TAKE these medications        acyclovir 200 MG capsule  Commonly known as:  ZOVIRAX  Take 2 capsules (400 mg total) by mouth daily.     amoxicillin-clavulanate 500-125 MG tablet  Commonly known as:  AUGMENTIN  Take 1 tablet (500 mg total) by mouth daily.     calcium acetate 667 MG capsule  Commonly known as:  PHOSLO  Take 2 capsules (1,334 mg total) by mouth 3 (three) times daily with meals.     dexamethasone 4 MG tablet  Commonly known as:  DECADRON  Take 5 tablets (40m) on Saturday and Sunday every week ([place]     feeding supplement (NEPRO CARB STEADY) Liqd  Take 237 mLs by mouth 2 (two) times daily between meals.     feeding supplement (PRO-STAT SUGAR FREE 64) Liqd  Take 30 mLs by mouth 2 (two) times daily.     ferrous sulfate 325 (65 FE) MG tablet  Take 1 tablet (325 mg total) by mouth 2 (two) times daily with a meal.     hydrALAZINE 25 MG tablet  Commonly known as:  APRESOLINE  Take 1 tablet (25 mg total) by mouth 3 (three) times daily.     metoprolol 50 MG tablet  Commonly known as:  LOPRESSOR  Take 1 tablet (  50 mg total) by mouth 2 (two) times daily.     mirtazapine 15 MG tablet  Commonly known as:  REMERON  Take 1 tablet (15 mg total) by mouth at bedtime.     multivitamin Tabs tablet  Take 1 tablet by mouth at bedtime.     prochlorperazine 10 MG tablet  Commonly known as:  COMPAZINE  Take 1 tablet (10 mg total) by mouth every 6 (six) hours as needed for nausea or vomiting.     senna-docusate 8.6-50 MG tablet  Commonly known as:  Senokot-S  Take 2 tablets by mouth 2 (two) times daily.     traMADol 50 MG tablet  Commonly known as:  ULTRAM  Take 1 tablet (50 mg total) by mouth every 6 (six) hours as needed.     VITAMIN D PO  Take 500 Units by mouth daily.           Follow-up Information     Follow up with [name], MD. Schedule an appointment as soon as possible for a visit in 1 week.   Specialty:  Internal Medicine   Contact information:   [place] [address] [phone]      Allergies  Allergen Reactions  . Ciprofloxacin Rash  . Mobic [Meloxicam] Rash    Consultations:  Nephrology.   Procedures/Studies: Dg Chest 2 View  08/06/2015  CLINICAL DATA:  Fever. Chemotherapy treatment for multiple myeloma. Chronic kidney disease. EXAM: CHEST  2 VIEW COMPARISON:  07/07/2015 FINDINGS: Right internal jugular central line has its tips in the right atrium. The heart is enlarged. There is venous hypertension with interstitial edema. There are bilateral pleural effusions with atelectasis in the dependent lungs. Basilar pneumonia not excluded. IMPRESSION: Dialysis catheter unchanged with the tip in the right atrium. Venous hypertension and interstitial edema. Bilateral dependent effusions with basilar atelectasis. Basilar pneumonia not excluded. Electronically Signed   By: Nelson Chimes M.D.   On: 08/06/2015 18:58       Subjective:  Currently does not have any complaints.  Discharge Exam: Filed Vitals:   08/10/15 0635 08/10/15 0831  BP: 120/81 114/75  Pulse: 79 91  Temp: 98 F (36.7 C) 97.8 F (36.6 C)  Resp: 18 17   Filed Vitals:   08/09/15 1731 08/09/15 2209 08/10/15 0635 08/10/15 0831  BP: 106/63 99/63 120/81 114/75  Pulse: 87 72 79 91  Temp: 98 F (36.7 C) 97.8 F (36.6 C) 98 F (36.7 C) 97.8 F (36.6 C)  TempSrc: Oral Oral Oral Oral  Resp: _0 Height:      Weight:      SpO2: 98% 98% 98% 98%    General: Pt is alert, awake, not in acute distress Cardiovascular: RRR, S1/S2 +, no rubs, no gallops Respiratory: CTA bilaterally, no wheezing, no rhonchi Abdominal: Soft, NT, ND, bowel sounds + Extremities: no edema, no cyanosis    The results of significant diagnostics from this hospitalization (including imaging,  microbiology, ancillary and laboratory) are listed below for reference.     Microbiology: Recent Results (from the past 240 hour(s))  Culture, blood (Routine X 2)     Status: None (Preliminary result)   Collection Time: 08/06/15  4:49 PM  Result Value Ref Range Status   Specimen Description BLOOD RIGHT HAND  Final   Special Requests BOTTLES DRAWN AEROBIC AND ANAEROBIC 5CC  Final   Culture NO GROWTH 4 DAYS  Final   Report Status PENDING  Incomplete  Culture, blood (  Routine X 2)     Status: None (Preliminary result)   Collection Time: 08/06/15  5:26 PM  Result Value Ref Range Status   Specimen Description BLOOD RIGHT HAND  Final   Special Requests BOTTLES DRAWN AEROBIC AND ANAEROBIC 5CC  Final   Culture NO GROWTH 4 DAYS  Final   Report Status PENDING  Incomplete  Urine culture     Status: Abnormal   Collection Time: 08/06/15  6:30 PM  Result Value Ref Range Status   Specimen Description URINE, CLEAN CATCH  Final   Special Requests NONE  Final   Culture MULTIPLE SPECIES PRESENT, SUGGEST RECOLLECTION (A)  Final   Report Status 08/07/2015 FINAL  Final  MRSA PCR Screening     Status: None   Collection Time: 08/07/15  6:30 AM  Result Value Ref Range Status   MRSA by PCR NEGATIVE NEGATIVE Final    Comment:        The GeneXpert MRSA Assay (FDA approved for NASAL specimens only), is one component of a comprehensive MRSA colonization surveillance program. It is not intended to diagnose MRSA infection nor to guide or monitor treatment for MRSA infections.      Labs: BNP (last 3 results) No results for input(s): BNP in the last 8760 hours. Basic Metabolic Panel:  Recent Labs Lab 08/06/15 1321 08/06/15 1649 08/07/15 0530 08/08/15 1850 08/10/15 1127  NA 138 135 137 135 137  K 3.4* 3.3* 3.3* 3.3* 3.3*  CL  --  98* 106 103 102  CO2 30* _0 GLUCOSE 146* 121* 98 133* 97  BUN 9.0 9 16 21* 52*  CREATININE 2.2* 2.59* 3.44* 3.77* 5.68*  CALCIUM 8.1* 7.7* 6.9* 7.8* 7.4*   MG  --   --   --  1.7  --   PHOS  --   --   --  2.8  --    Liver Function Tests:  Recent Labs Lab 08/06/15 1321 08/06/15 1649 08/07/15 0530  AST 9 17 12*  ALT <9 9* 6*  ALKPHOS 108 98 77  BILITOT 0.49 0.5 0.5  PROT 6.4 6.2* 4.9*  ALBUMIN 1.8* 1.9* 1.5*   No results for input(s): LIPASE, AMYLASE in the last 168 hours. No results for input(s): AMMONIA in the last 168 hours. CBC:  Recent Labs Lab 08/06/15 1321 08/06/15 1726 08/07/15 0530 08/08/15 1850 08/10/15 1127  WBC 11.4* 9.4 9.1 3.5* 9.8  NEUTROABS 9.7* 7.5  --   --   --   HGB 9.1* 8.1* 7.4* 8.6* 8.1*  HCT 27.6* 25.0* 23.2* 27.4* 24.7*  MCV 92.6 94.7 95.5 94.2 94.3  PLT 252 266 252 258 272   Cardiac Enzymes:  Recent Labs Lab 08/06/15 1726  TROPONINI <0.03   BNP: Invalid input(s): POCBNP CBG: No results for input(s): GLUCAP in the last 168 hours. D-Dimer No results for input(s): DDIMER in the last 72 hours. Hgb A1c No results for input(s): HGBA1C in the last 72 hours. Lipid Profile No results for input(s): CHOL, HDL, LDLCALC, TRIG, CHOLHDL, LDLDIRECT in the last 72 hours. Thyroid function studies No results for input(s): TSH, T4TOTAL, T3FREE, THYROIDAB in the last 72 hours.  Invalid input(s): FREET3 Anemia work up  Recent Labs  08/08/15 1850  TIBC 118*  IRON 19*   Urinalysis    Component Value Date/Time   COLORURINE RED* 08/06/2015 1830   APPEARANCEUR CLOUDY* 08/06/2015 1830   LABSPEC 1.012 08/06/2015 1830   LABSPEC 1.030 01/22/2015 1018   PHURINE 8.0 08/06/2015  [place] 5.0 01/22/2015 [place] 08/06/2015 1830   GLUCOSEU Negative 01/22/2015 1018   HGBUR LARGE* 08/06/2015 1830   HGBUR Large 01/22/2015 1018   BILIRUBINUR NEGATIVE 08/06/2015 1830   BILIRUBINUR Negative 01/22/2015 [place] 08/06/2015 1830   KETONESUR Negative 01/22/2015 1018   PROTEINUR >300* 08/06/2015 [place] 01/22/2015 1018   UROBILINOGEN 0.2 01/22/2015 1018    UROBILINOGEN 0.2 01/31/2014 1050   NITRITE NEGATIVE 08/06/2015 1830   NITRITE Negative 01/22/2015 1018   LEUKOCYTESUR MODERATE* 08/06/2015 1830   LEUKOCYTESUR Color Interference due to blood in urine 01/22/2015 1018   Sepsis Labs Invalid input(s): PROCALCITONIN,  WBC,  LACTICIDVEN Microbiology Recent Results (from the past 240 hour(s))  Culture, blood (Routine X 2)     Status: None (Preliminary result)   Collection Time: 08/06/15  4:49 PM  Result Value Ref Range Status   Specimen Description BLOOD RIGHT HAND  Final   Special Requests BOTTLES DRAWN AEROBIC AND ANAEROBIC 5CC  Final   Culture NO GROWTH 4 DAYS  Final   Report Status PENDING  Incomplete  Culture, blood (Routine X 2)     Status: None (Preliminary result)   Collection Time: 08/06/15  5:26 PM  Result Value Ref Range Status   Specimen Description BLOOD RIGHT HAND  Final   Special Requests BOTTLES DRAWN AEROBIC AND ANAEROBIC 5CC  Final   Culture NO GROWTH 4 DAYS  Final   Report Status PENDING  Incomplete  Urine culture     Status: Abnormal   Collection Time: 08/06/15  6:30 PM  Result Value Ref Range Status   Specimen Description URINE, CLEAN CATCH  Final   Special Requests NONE  Final   Culture MULTIPLE SPECIES PRESENT, SUGGEST RECOLLECTION (A)  Final   Report Status 08/07/2015 FINAL  Final  MRSA PCR Screening     Status: None   Collection Time: 08/07/15  6:30 AM  Result Value Ref Range Status   MRSA by PCR NEGATIVE NEGATIVE Final    Comment:        The GeneXpert MRSA Assay (FDA approved for NASAL specimens only), is one component of a comprehensive MRSA colonization surveillance program. It is not intended to diagnose MRSA infection nor to guide or monitor treatment for MRSA infections.      Time coordinating discharge: Over 30 minutes  SIGNED:   Hosie Poisson, MD  [facility] 08/11/2015, 10:08 AM Pager [phone]   If 7PM-7AM, please contact night-coverage [email or web address] Password [email or web address]

## 2015-08-12 ENCOUNTER — Other Ambulatory Visit (HOSPITAL_BASED_OUTPATIENT_CLINIC_OR_DEPARTMENT_OTHER): Payer: Self-pay

## 2015-08-12 ENCOUNTER — Encounter: Payer: Self-pay | Admitting: Oncology

## 2015-08-12 ENCOUNTER — Other Ambulatory Visit: Payer: Self-pay | Admitting: Oncology

## 2015-08-12 ENCOUNTER — Ambulatory Visit (HOSPITAL_BASED_OUTPATIENT_CLINIC_OR_DEPARTMENT_OTHER): Payer: Self-pay

## 2015-08-12 ENCOUNTER — Other Ambulatory Visit: Payer: Self-pay

## 2015-08-12 ENCOUNTER — Ambulatory Visit (HOSPITAL_BASED_OUTPATIENT_CLINIC_OR_DEPARTMENT_OTHER): Payer: Self-pay | Admitting: Oncology

## 2015-08-12 VITALS — BP 133/78 | HR 74 | Temp 98.3°F | Resp 18 | Ht 65.0 in | Wt 128.2 lb

## 2015-08-12 DIAGNOSIS — D649 Anemia, unspecified: Secondary | ICD-10-CM

## 2015-08-12 DIAGNOSIS — D630 Anemia in neoplastic disease: Secondary | ICD-10-CM

## 2015-08-12 DIAGNOSIS — C9000 Multiple myeloma not having achieved remission: Secondary | ICD-10-CM

## 2015-08-12 DIAGNOSIS — C9002 Multiple myeloma in relapse: Secondary | ICD-10-CM

## 2015-08-12 DIAGNOSIS — G893 Neoplasm related pain (acute) (chronic): Principal | ICD-10-CM

## 2015-08-12 DIAGNOSIS — M899 Disorder of bone, unspecified: Secondary | ICD-10-CM

## 2015-08-12 DIAGNOSIS — N179 Acute kidney failure, unspecified: Secondary | ICD-10-CM

## 2015-08-12 DIAGNOSIS — C7951 Secondary malignant neoplasm of bone: Secondary | ICD-10-CM

## 2015-08-12 DIAGNOSIS — N186 End stage renal disease: Secondary | ICD-10-CM

## 2015-08-12 DIAGNOSIS — Z5112 Encounter for antineoplastic immunotherapy: Secondary | ICD-10-CM

## 2015-08-12 DIAGNOSIS — Z992 Dependence on renal dialysis: Secondary | ICD-10-CM

## 2015-08-12 LAB — CBC WITH DIFFERENTIAL/PLATELET
BASO%: 0.1 % (ref 0.0–2.0)
Basophils Absolute: 0 10*3/uL (ref 0.0–0.1)
EOS%: 0.6 % (ref 0.0–7.0)
Eosinophils Absolute: 0.1 10*3/uL (ref 0.0–0.5)
HEMATOCRIT: 25.1 % — AB (ref 38.4–49.9)
HEMOGLOBIN: 8.1 g/dL — AB (ref 13.0–17.1)
LYMPH#: 1.2 10*3/uL (ref 0.9–3.3)
LYMPH%: 11.8 % — ABNORMAL LOW (ref 14.0–49.0)
MCH: 30 pg (ref 27.2–33.4)
MCHC: 32.3 g/dL (ref 32.0–36.0)
MCV: 93 fL (ref 79.3–98.0)
MONO#: 0.9 10*3/uL (ref 0.1–0.9)
MONO%: 9 % (ref 0.0–14.0)
NEUT#: 8 10*3/uL — ABNORMAL HIGH (ref 1.5–6.5)
NEUT%: 78.5 % — ABNORMAL HIGH (ref 39.0–75.0)
NRBC: 0 % (ref 0–0)
Platelets: 328 10*3/uL (ref 140–400)
RBC: 2.7 10*6/uL — ABNORMAL LOW (ref 4.20–5.82)
RDW: 17.1 % — AB (ref 11.0–14.6)
WBC: 10.1 10*3/uL (ref 4.0–10.3)

## 2015-08-12 LAB — COMPREHENSIVE METABOLIC PANEL
ALBUMIN: 2 g/dL — AB (ref 3.5–5.0)
ALK PHOS: 111 U/L (ref 40–150)
AST: 12 U/L (ref 5–34)
Anion Gap: 13 mEq/L — ABNORMAL HIGH (ref 3–11)
BILIRUBIN TOTAL: 0.44 mg/dL (ref 0.20–1.20)
BUN: 33.4 mg/dL — AB (ref 7.0–26.0)
CALCIUM: 8.6 mg/dL (ref 8.4–10.4)
CO2: 25 mEq/L (ref 22–29)
CREATININE: 5.1 mg/dL — AB (ref 0.7–1.3)
Chloride: 97 mEq/L — ABNORMAL LOW (ref 98–109)
EGFR: 13 mL/min/{1.73_m2} — ABNORMAL LOW (ref >90)
GLUCOSE: 114 mg/dL (ref 70–140)
Potassium: 3.6 mEq/L (ref 3.5–5.1)
SODIUM: 135 meq/L — AB (ref 136–145)
TOTAL PROTEIN: 7.3 g/dL (ref 6.4–8.3)

## 2015-08-12 MED ORDER — PROCHLORPERAZINE MALEATE 10 MG PO TABS
ORAL_TABLET | ORAL | Status: AC
  Filled 2015-08-12: qty 1

## 2015-08-12 MED ORDER — SODIUM CHLORIDE 0.9 % IV SOLN
10.0000 mg | Freq: Once | INTRAVENOUS | Status: AC
  Administered 2015-08-12: 10 mg via INTRAVENOUS
  Filled 2015-08-12: qty 1

## 2015-08-12 MED ORDER — SODIUM CHLORIDE 0.9 % IV SOLN
Freq: Once | INTRAVENOUS | Status: AC
  Administered 2015-08-12: 16:00:00 via INTRAVENOUS

## 2015-08-12 MED ORDER — PROCHLORPERAZINE MALEATE 10 MG PO TABS
10.0000 mg | ORAL_TABLET | Freq: Once | ORAL | Status: AC
  Administered 2015-08-12: 10 mg via ORAL

## 2015-08-12 MED ORDER — DEXTROSE 5 % IV SOLN
27.0000 mg/m2 | Freq: Once | INTRAVENOUS | Status: AC
  Administered 2015-08-12: 46 mg via INTRAVENOUS
  Filled 2015-08-12: qty 23

## 2015-08-12 NOTE — Patient Instructions (Signed)
[facility] Discharge Instructions for Patients Receiving Chemotherapy  Today you received the following chemotherapy agents: Kyprolis   To help prevent nausea and vomiting after your treatment, we encourage you to take your nausea medication as directed.    If you develop nausea and vomiting that is not controlled by your nausea medication, call the clinic.   BELOW ARE SYMPTOMS THAT SHOULD BE REPORTED IMMEDIATELY:  *FEVER GREATER THAN 100.5 F  *CHILLS WITH OR WITHOUT FEVER  NAUSEA AND VOMITING THAT IS NOT CONTROLLED WITH YOUR NAUSEA MEDICATION  *UNUSUAL SHORTNESS OF BREATH  *UNUSUAL BRUISING OR BLEEDING  TENDERNESS IN MOUTH AND THROAT WITH OR WITHOUT PRESENCE OF ULCERS  *URINARY PROBLEMS  *BOWEL PROBLEMS  UNUSUAL RASH Items with * indicate a potential emergency and should be followed up as soon as possible.  Feel free to call the clinic you have any questions or concerns. The clinic phone number is [phone].  Please show the CHEMO ALERT CARD at check-in to the Emergency Department and triage nurse.   

## 2015-08-12 NOTE — Progress Notes (Signed)
Here his progress   [place]  [phone] [phone]    ID: Charles [name] DOB: 1975-03-04  MR#: [id number]  ERD#:[id number]  Patient Care Team: Tresa Garter, MD as PCP - General (Internal Medicine) [name], NP as Nurse Practitioner (Hematology and Oncology) [name], MD as Consulting Physician (Oncology) PCP: [name], MD GYN: SU:  OTHER MD:  CHIEF COMPLAINT: multiple myeloma  CURRENT TREATMENT:   Denosumab/Xgeva; carfilzomib, dexamethasone  HISTORY  OF MULTIPLE MYELOMA: From the original intake note January 2016:  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 The pain was worse with coughing. Exam was unremarkable, and he was treated with Mobic. No labs were obtained. On 01/30/2014 he presented to the emergency room at [facility] with similar complaints and also reported a 10 pound weight loss over the past month. A chest x-ray was significant only for mild atelectasis, but ACT NGO of the chest 01/30/2014, while it showed no pulmonary emboli, showed an osteolytic lesion destroying the manubrium of the sternum. There was extra osseous extension into the anterior mediastinum.  The patient was admitted and found to have a creatinine of 1.64 with a GFR of 51. Bilateral renal ultrasound showed no hydronephrosis or masses, and normal size and cortical thickness of both kidneys, though there was mild diffuse echogenicity. Urine total protein was 145 mg/dL and immunofixation showed a monoclonal IgG kappa protein as well as monoclonal free kappa light chains. On 01/31/2014 serum protein electrophoresis showed an M spike of 2.22 g/dL with a total protein of 8.3 and albumin 3.4. Kappa lambda light chains from the serum obtained 02/04/2014 showed 10.50 mg/dL on the, 1.74 in the lambda,  with an elevated ratio at 6.03. Beta-2 microglobulin was 7.36. LDH was normal at 184 and HIV antibody was nonreactive.  Biopsy of the manubrial mass 02/03/2014 showed extensive infiltration of the bone by sheets of atypical plasma cells, positive for CD138, kappa restricted (SZA 16-23). Right iliac bone marrow biopsy [name] 05/21/2014 (FZB 16-1) showed an average of 12% plasma cells with numerous aggregates and sheets of atypical plasma cells, which were kappa restricted. Cytogenetic analysis is pending  The patient's subsequent history is as detailed below  INTERVAL HISTORY: Charles [name] returns today for follow-up of his advanced multiple myeloma. Since his last visit here he was hospitalized for fever and possible sepsis. Etiology was unclear. Blood cultures were negative, urine culture showed multiple species, and chest x-ray showed venous hypertension interstitial edema with bilateral dependent effusions and basilar atelectasis. Basilar pneumonia was not excluded. He was given 5 days of broad-spectrum IV antibiotics and then discharged home on oral Augmentin. The patient remains on hemodialysis on Tuesdays, Thursdays, and Saturdays. He has a fistula in the left upper arm. He is treated on Tuesdays, Thursdays, and Saturdays. He continues on carfilzomib, which he appears to be tolerating well. If he receives carfilzomib on a hemodialysis day he receives it after hemodialysis. Labs show a significant reduction in his monoclonal protein, and a smaller reduction in his light chains. He continues on 20 mg of dexamethasone on Sundays and Mondays.  REVIEW OF SYSTEMS: Charles [name] feels weak. He has not had any fevers or chills since discharge from the hospital. On hemodialysis days there is little that he can do except rest. On the "off" days he feels a little bit more normal  but still rarely leaves the house at this point. His pain is controlled with Ultram as needed. He is not constipated from these medications. There  have been no unusual headaches, visual changes, nausea, or vomiting a detailed review of systems today was otherwise stable  PAST MEDICAL HISTORY: Past Medical History  Diagnosis Date  . Lytic bone lesions on xray   . Hypertension   . GERD (gastroesophageal reflux disease)   . Neuromuscular disorder ([place])     states both arms are shaking a lot  . ESRD (end stage renal disease) on dialysis ([place])     [address] [facility] (08/06/2015)  . Kidney stones   . History of blood transfusion 07/2015    "related to his kidneys"  . Headache     "monthly" (08/06/2015)  . IgG myeloma ([place])     PAST SURGICAL HISTORY: Past Surgical History  Procedure Laterality Date  . Insertion of dialysis catheter Right ~ 07/2015    chest  . Av fistula placement Left 07/13/2015    Procedure: CREATION OF LEFT UPPER ARM  ARTERIOVENOUS (AV) FISTULA ;  Surgeon: Elam Dutch, MD;  Location: [facility] OR;  Service: Vascular;  Laterality: Left;    FAMILY HISTORY Family History  Problem Relation Age of Onset  . Diabetes Father    The patient's parents are still living, in their late 62's. The patient has 2 brothers, 3 sisters. There is noc cancer in the fmaily to his knowledge.  SOCIAL HISTORY:  Originally from [facility], moved here 19 years ago. He works in Nurse, adult. Married (wife's name is [name]), lives in [place]. He has 5 children ages 6, 69, 65, 3 and 35 months Charles [name], Charles [name], Charles [name] and Charles [name]) in good health. Best number to call him is [phone]    ADVANCED DIRECTIVES: not  In place   HEALTH MAINTENANCE: Social History  Substance Use Topics  . Smoking status: Never Smoker   . Smokeless tobacco: Never Used  . Alcohol Use: No     Colonoscopy:  PSA:  Bone density:  Lipid panel:  Allergies  Allergen Reactions  . Ciprofloxacin Rash  . Mobic [Meloxicam] Rash    Current Outpatient Prescriptions  Medication Sig Dispense Refill  . acyclovir (ZOVIRAX) 200 MG  capsule Take 2 capsules (400 mg total) by mouth daily. 60 capsule 6  . Amino Acids-Protein Hydrolys (FEEDING SUPPLEMENT, PRO-STAT SUGAR FREE 64,) LIQD Take 30 mLs by mouth 2 (two) times daily.    Marland Kitchen amoxicillin-clavulanate (AUGMENTIN) 500-125 MG tablet Take 1 tablet (500 mg total) by mouth daily. 3 tablet 0  . calcium acetate (PHOSLO) 667 MG capsule Take 2 capsules (1,334 mg total) by mouth 3 (three) times daily with meals. 120 capsule 0  . Cholecalciferol (VITAMIN D PO) Take 500 Units by mouth daily.    Marland Kitchen dexamethasone (DECADRON) 4 MG tablet Take 5 tablets (39m) on Saturday and Sunday every week ([place]. 60 tablet 4  . ferrous sulfate 325 (65 FE) MG tablet Take 1 tablet (325 mg total) by mouth 2 (two) times daily with a meal. 30 tablet 3  . hydrALAZINE (APRESOLINE) 25 MG tablet Take 1 tablet (25 mg total) by mouth 3 (three) times daily. 90 tablet 0  . metoprolol (LOPRESSOR) 50 MG tablet Take 1 tablet (50 mg total) by mouth 2 (two) times daily. 60 tablet 0  . mirtazapine (REMERON) 15 MG tablet Take 1 tablet (15 mg total)  by mouth at bedtime. 30 tablet 0  . multivitamin (RENA-VIT) TABS tablet Take 1 tablet by mouth at bedtime. 30 tablet 0  . Nutritional Supplements (FEEDING SUPPLEMENT, NEPRO CARB STEADY,) LIQD Take 237 mLs by mouth 2 (two) times daily between meals.    . prochlorperazine (COMPAZINE) 10 MG tablet Take 1 tablet (10 mg total) by mouth every 6 (six) hours as needed for nausea or vomiting. 60 tablet 6  . senna-docusate (SENOKOT-S) 8.6-50 MG tablet Take 2 tablets by mouth 2 (two) times daily. 120 tablet 0  . traMADol (ULTRAM) 50 MG tablet Take 1 tablet (50 mg total) by mouth every 6 (six) hours as needed. (Patient taking differently: Take 50 mg by mouth every 6 (six) hours as needed for moderate pain. ) 60 tablet 5   No current facility-administered medications for this visit.    OBJECTIVE: Young Latin manWho appears fatigued Filed  Vitals:   08/12/15 1346  BP: 133/78  Pulse: 74  Temp: 98.3 F (36.8 C)  Resp: 18     Body mass index is 21.33 kg/(m^2).    ECOG FS:2 - Symptomatic, <50% confined to bed   Sclerae unicteric, pupils round and equal Oropharynx clear and moist-- no thrush or other lesions No cervical or supraclavicular adenopathy Lungs no rales or rhonchi Heart regular rate and rhythm Abd soft, nontender, positive bowel sounds MSK no focal spinal tenderness, no upper extremity lymphedema, good thrill left upper arm Neuro: nonfocal, well oriented, appropriate affect  LABS:  Kappa/Lambda FluidC Ratio 0.26 - 1.65  8.01 (H) 29.83 (H) 13.72 (H) 6.83 (H)        M Protein SerPl Elph-Mcnc Not Observed g/dL 0.3 (H)  1.7 (H) 1.8 (H)            CMP     Component Value Date/Time   NA 137 08/10/2015 1127   NA 138 08/06/2015 1321   K 3.3* 08/10/2015 1127   K 3.4* 08/06/2015 1321   CL 102 08/10/2015 1127   CO2 27 08/10/2015 1127   CO2 30* 08/06/2015 1321   GLUCOSE 97 08/10/2015 1127   GLUCOSE 146* 08/06/2015 1321   BUN 52* 08/10/2015 1127   BUN 9.0 08/06/2015 1321   CREATININE 5.68* 08/10/2015 1127   CREATININE 2.2* 08/06/2015 1321   CALCIUM 7.4* 08/10/2015 1127   CALCIUM 8.1* 08/06/2015 1321   PROT 4.9* 08/07/2015 0530   PROT 6.4 08/06/2015 1321   PROT 4.6* 07/15/2015 1105   ALBUMIN 1.5* 08/07/2015 0530   ALBUMIN 1.8* 08/06/2015 1321   AST 12* 08/07/2015 0530   AST 9 08/06/2015 1321   ALT 6* 08/07/2015 0530   ALT <9 08/06/2015 1321   ALKPHOS 77 08/07/2015 0530   ALKPHOS 108 08/06/2015 1321   BILITOT 0.5 08/07/2015 0530   BILITOT 0.49 08/06/2015 1321   GFRNONAA 11* 08/10/2015 1127   GFRAA 13* 08/10/2015 1127    INo results found for: SPEP, UPEP  Lab Results  Component Value Date   WBC 10.1 08/12/2015   NEUTROABS 8.0* 08/12/2015   HGB 8.1* 08/12/2015   HCT 25.1* 08/12/2015   MCV 93.0 08/12/2015   PLT 328 08/12/2015      Chemistry      Component Value Date/Time   NA 137  08/10/2015 1127   NA 138 08/06/2015 1321   K 3.3* 08/10/2015 1127   K 3.4* 08/06/2015 1321   CL 102 08/10/2015 1127   CO2 27 08/10/2015 1127   CO2 30* 08/06/2015 1321   BUN 52* 08/10/2015  1127   BUN 9.0 08/06/2015 1321   CREATININE 5.68* 08/10/2015 1127   CREATININE 2.2* 08/06/2015 1321      Component Value Date/Time   CALCIUM 7.4* 08/10/2015 1127   CALCIUM 8.1* 08/06/2015 1321   ALKPHOS 77 08/07/2015 0530   ALKPHOS 108 08/06/2015 1321   AST 12* 08/07/2015 0530   AST 9 08/06/2015 1321   ALT 6* 08/07/2015 0530   ALT <9 08/06/2015 1321   BILITOT 0.5 08/07/2015 0530   BILITOT 0.49 08/06/2015 1321       No results found for: LABCA2  No components found for: LABCA125  No results for input(s): INR in the last 168 hours.  Urinalysis    Component Value Date/Time   COLORURINE RED* 08/06/2015 1830   APPEARANCEUR CLOUDY* 08/06/2015 1830   LABSPEC 1.012 08/06/2015 1830   LABSPEC 1.030 01/22/2015 1018   PHURINE 8.0 08/06/2015 1830   PHURINE 5.0 01/22/2015 1018   GLUCOSEU NEGATIVE 08/06/2015 1830   GLUCOSEU Negative 01/22/2015 1018   HGBUR LARGE* 08/06/2015 1830   HGBUR Large 01/22/2015 1018   BILIRUBINUR NEGATIVE 08/06/2015 1830   BILIRUBINUR Negative 01/22/2015 1018   KETONESUR NEGATIVE 08/06/2015 1830   KETONESUR Negative 01/22/2015 1018   PROTEINUR >300* 08/06/2015 1830   PROTEINUR 2000 01/22/2015 1018   UROBILINOGEN 0.2 01/22/2015 1018   UROBILINOGEN 0.2 01/31/2014 1050   NITRITE NEGATIVE 08/06/2015 1830   NITRITE Negative 01/22/2015 1018   LEUKOCYTESUR MODERATE* 08/06/2015 1830   LEUKOCYTESUR Color Interference due to blood in urine 01/22/2015 1018    STUDIES: Dg Chest 2 View  08/06/2015  CLINICAL DATA:  Fever. Chemotherapy treatment for multiple myeloma. Chronic kidney disease. EXAM: CHEST  2 VIEW COMPARISON:  07/07/2015 FINDINGS: Right internal jugular central line has its tips in the right atrium. The heart is enlarged. There is venous hypertension with  interstitial edema. There are bilateral pleural effusions with atelectasis in the dependent lungs. Basilar pneumonia not excluded. IMPRESSION: Dialysis catheter unchanged with the tip in the right atrium. Venous hypertension and interstitial edema. Bilateral dependent effusions with basilar atelectasis. Basilar pneumonia not excluded. Electronically Signed   By: Nelson Chimes M.D.   On: 08/06/2015 18:58    ASSESSMENT: 40 y.o. 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;        I: CyBorD started January 2016  (a) dexamethasone 20 mg/d every TU/WED started 02/04/2014  (b) bortezomib sQ days 1,4,8,11 of every 21 day cycle started 02/10/2014  (c) cyclophosphamide 300 mg/M2 IV weekly started 02/13/2014       2: Lenalidomide 23m daily started 07/26/2014, discontinued December 2016 with progression  (2) 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 The patient is not a transplant candidate for financial reasons  (6) bilateral foot rash: Started on Septra DS and  Diflucan 09/03/2014, resolved  (7) Multiple Myeloma relapse in December 2016.   (a) starting 02/05/15: dexamethasone 75m x2 consecutive  days weekly; bortezomib sQ weekly,   (b) Septra DS twice daily  (c) acyclovir 4071mdaily   (d) bortezomib/ dexamethasone discontinued after 05/06/2015 dose with evidence of progression  (8) started carfilzomib 05/28/2015, given with weekly low dose dexamethasone  (a) tolerated cyclophosphamide poorly, discontinued after cycle 1  (b) carfilzomib started 05/28/2015   (i) cycle 2 started 07/01/2015   (ii) cycle 3 to start 08/06/2015; delayed by 1 week due to hospitalization. Started on 08/13/2015.  (c) taking dexamethasone 20 mg every Sunday and Monday  (d) lenalidomide discontinued 07/29/2015  (9) on hemodialysis Tu/Th/Sa as of early June 2017  (10) adjuvant radiation completed 07/27/2015  PLAN: Amiri has improved since his recent hospitalization for fevers. Recommend that he continue his Augmentin until complete. He is responding to the carfilzomib.   He remains very tired particularly on the days that he receives hemodialysis. He is anemic. Will add a ferritin level today. He is receiving growth factors and iron at hemodialysis. Receives blood transfusions as necessary.  The patient was seen with Dr. MaJana HakimHe will proceed with cycle 3 day 1 of his carfilzomib today and cycle 3 day 2 tomorrow. Continue dexamethasone 20 mg every Sunday and Monday. We will consider switching him over to a subcutaneous treatment for his myeloma in the near future.  The plan for now is to continue the carfilzomib 3 weeks on and one-week off, and then depending on response, continue, or switch to maintenance.  Chanz knows to call for any problems that may develop before his next visit here.   CUMikey BussingNP   08/12/2015 3:08 PM    ADDENDUM: Damari's situation is now more stable. He is undergoing dialysis, and while it makes him feel very weak, it clearly is prolonging his life. He is willing to continue it indefinitely.  We are going to proceed through this cycle of carfilzomib and then switch  him either to subcutaneous bortezomib or to lenalidomide, but in any case something that doesn't require intravenous treatment.  The social situation here is complex and a source of concern. I am not sure how to help Hero and his family as they go through this very difficult stretch. I will ask our social workers to see if they can intervene when he returns to see me at the next visit.  I personally saw this patient and performed a substantive portion of this encounter with the listed APP documented above.   MAChauncey CruelMD Medical Oncology and Hematology Co[facility]inte Hospital[address] [address]227el. [phone]  Fax. [phone]

## 2015-08-13 ENCOUNTER — Ambulatory Visit (HOSPITAL_BASED_OUTPATIENT_CLINIC_OR_DEPARTMENT_OTHER): Payer: Self-pay

## 2015-08-13 DIAGNOSIS — G893 Neoplasm related pain (acute) (chronic): Principal | ICD-10-CM

## 2015-08-13 DIAGNOSIS — C7951 Secondary malignant neoplasm of bone: Secondary | ICD-10-CM

## 2015-08-13 DIAGNOSIS — C9000 Multiple myeloma not having achieved remission: Secondary | ICD-10-CM

## 2015-08-13 DIAGNOSIS — Z5112 Encounter for antineoplastic immunotherapy: Secondary | ICD-10-CM

## 2015-08-13 DIAGNOSIS — C9002 Multiple myeloma in relapse: Secondary | ICD-10-CM

## 2015-08-13 DIAGNOSIS — M899 Disorder of bone, unspecified: Secondary | ICD-10-CM

## 2015-08-13 LAB — BETA 2 MICROGLOBULIN, SERUM: BETA 2: 29 mg/L — AB (ref 0.6–2.4)

## 2015-08-13 LAB — FERRITIN: Ferritin: 2104 ng/ml — ABNORMAL HIGH (ref 22–316)

## 2015-08-13 LAB — KAPPA/LAMBDA LIGHT CHAINS
IG KAPPA FREE LIGHT CHAIN: 172.7 mg/L — AB (ref 3.3–19.4)
Ig Lambda Free Light Chain: 30.5 mg/L — ABNORMAL HIGH (ref 5.7–26.3)
Kappa/Lambda FluidC Ratio: 5.66 — ABNORMAL HIGH (ref 0.26–1.65)

## 2015-08-13 MED ORDER — DENOSUMAB 120 MG/1.7ML ~~LOC~~ SOLN
120.0000 mg | Freq: Once | SUBCUTANEOUS | Status: AC
  Administered 2015-08-13: 120 mg via SUBCUTANEOUS
  Filled 2015-08-13: qty 1.7

## 2015-08-13 MED ORDER — PROCHLORPERAZINE MALEATE 10 MG PO TABS
ORAL_TABLET | ORAL | Status: AC
  Filled 2015-08-13: qty 1

## 2015-08-13 MED ORDER — DEXTROSE 5 % IV SOLN
27.0000 mg/m2 | Freq: Once | INTRAVENOUS | Status: AC
  Administered 2015-08-13: 46 mg via INTRAVENOUS
  Filled 2015-08-13: qty 23

## 2015-08-13 MED ORDER — DEXAMETHASONE SODIUM PHOSPHATE 100 MG/10ML IJ SOLN
10.0000 mg | Freq: Once | INTRAMUSCULAR | Status: AC
  Administered 2015-08-13: 10 mg via INTRAVENOUS
  Filled 2015-08-13: qty 1

## 2015-08-13 MED ORDER — PROCHLORPERAZINE MALEATE 10 MG PO TABS
10.0000 mg | ORAL_TABLET | Freq: Once | ORAL | Status: AC
  Administered 2015-08-13: 10 mg via ORAL

## 2015-08-13 MED ORDER — SODIUM CHLORIDE 0.9 % IV SOLN
Freq: Once | INTRAVENOUS | Status: AC
  Administered 2015-08-13: 15:00:00 via INTRAVENOUS

## 2015-08-13 NOTE — Patient Instructions (Signed)
[place] Discharge Instructions for Patients Receiving Chemotherapy  Today you received the following chemotherapy agents: Kyprolis.  To help prevent nausea and vomiting after your treatment, we encourage you to take your nausea medication: Compazine 10 mg every 6 hours as needed.   If you develop nausea and vomiting that is not controlled by your nausea medication, call the clinic.   BELOW ARE SYMPTOMS THAT SHOULD BE REPORTED IMMEDIATELY:  *FEVER GREATER THAN 100.5 F  *CHILLS WITH OR WITHOUT FEVER  NAUSEA AND VOMITING THAT IS NOT CONTROLLED WITH YOUR NAUSEA MEDICATION  *UNUSUAL SHORTNESS OF BREATH  *UNUSUAL BRUISING OR BLEEDING  TENDERNESS IN MOUTH AND THROAT WITH OR WITHOUT PRESENCE OF ULCERS  *URINARY PROBLEMS  *BOWEL PROBLEMS  UNUSUAL RASH Items with * indicate a potential emergency and should be followed up as soon as possible.  Feel free to call the clinic you have any questions or concerns. The clinic phone number is [phone].  Please show the [place] at check-in to the Emergency Department and triage nurse.

## 2015-08-14 ENCOUNTER — Telehealth: Payer: Self-pay | Admitting: Oncology

## 2015-08-14 NOTE — Telephone Encounter (Signed)
Spoke with pt wife to confirm appt dates/times

## 2015-08-17 LAB — MULTIPLE MYELOMA PANEL, SERUM
ALBUMIN/GLOB SERPL: 0.6 — AB (ref 0.7–1.7)
ALPHA 1: 0.5 g/dL — AB (ref 0.0–0.4)
ALPHA2 GLOB SERPL ELPH-MCNC: 1.5 g/dL — AB (ref 0.4–1.0)
Albumin SerPl Elph-Mcnc: 2.3 g/dL — ABNORMAL LOW (ref 2.9–4.4)
B-GLOBULIN SERPL ELPH-MCNC: 0.6 g/dL — AB (ref 0.7–1.3)
Gamma Glob SerPl Elph-Mcnc: 1.7 g/dL (ref 0.4–1.8)
Globulin, Total: 4.3 g/dL — ABNORMAL HIGH (ref 2.2–3.9)
IGM (IMMUNOGLOBIN M), SRM: 35 mg/dL (ref 20–172)
IgA, Qn, Serum: 35 mg/dL — ABNORMAL LOW (ref 90–386)
M PROTEIN SERPL ELPH-MCNC: 1.4 g/dL — AB
TOTAL PROTEIN: 6.6 g/dL (ref 6.0–8.5)

## 2015-08-19 ENCOUNTER — Other Ambulatory Visit (HOSPITAL_BASED_OUTPATIENT_CLINIC_OR_DEPARTMENT_OTHER): Payer: Self-pay

## 2015-08-19 ENCOUNTER — Other Ambulatory Visit: Payer: Self-pay

## 2015-08-19 ENCOUNTER — Ambulatory Visit (HOSPITAL_BASED_OUTPATIENT_CLINIC_OR_DEPARTMENT_OTHER): Payer: Self-pay | Admitting: Oncology

## 2015-08-19 ENCOUNTER — Ambulatory Visit (HOSPITAL_BASED_OUTPATIENT_CLINIC_OR_DEPARTMENT_OTHER): Payer: Self-pay

## 2015-08-19 ENCOUNTER — Encounter: Payer: Self-pay | Admitting: Oncology

## 2015-08-19 VITALS — BP 104/65 | HR 96 | Temp 99.3°F | Resp 18 | Ht 65.0 in | Wt 130.5 lb

## 2015-08-19 VITALS — BP 97/69 | HR 74 | Temp 98.6°F | Resp 16

## 2015-08-19 DIAGNOSIS — C7951 Secondary malignant neoplasm of bone: Secondary | ICD-10-CM

## 2015-08-19 DIAGNOSIS — M899 Disorder of bone, unspecified: Secondary | ICD-10-CM

## 2015-08-19 DIAGNOSIS — Z992 Dependence on renal dialysis: Secondary | ICD-10-CM

## 2015-08-19 DIAGNOSIS — C9002 Multiple myeloma in relapse: Secondary | ICD-10-CM

## 2015-08-19 DIAGNOSIS — C9000 Multiple myeloma not having achieved remission: Secondary | ICD-10-CM

## 2015-08-19 DIAGNOSIS — G893 Neoplasm related pain (acute) (chronic): Principal | ICD-10-CM

## 2015-08-19 DIAGNOSIS — D649 Anemia, unspecified: Secondary | ICD-10-CM | POA: Insufficient documentation

## 2015-08-19 DIAGNOSIS — D630 Anemia in neoplastic disease: Secondary | ICD-10-CM

## 2015-08-19 DIAGNOSIS — Z5112 Encounter for antineoplastic immunotherapy: Secondary | ICD-10-CM

## 2015-08-19 DIAGNOSIS — N186 End stage renal disease: Secondary | ICD-10-CM

## 2015-08-19 LAB — CBC WITH DIFFERENTIAL/PLATELET
BASO%: 0.2 % (ref 0.0–2.0)
Basophils Absolute: 0 10*3/uL (ref 0.0–0.1)
EOS%: 0.2 % (ref 0.0–7.0)
Eosinophils Absolute: 0 10*3/uL (ref 0.0–0.5)
HCT: 23.4 % — ABNORMAL LOW (ref 38.4–49.9)
HGB: 7.6 g/dL — ABNORMAL LOW (ref 13.0–17.1)
LYMPH%: 8.6 % — AB (ref 14.0–49.0)
MCH: 30.8 pg (ref 27.2–33.4)
MCHC: 32.7 g/dL (ref 32.0–36.0)
MCV: 94.4 fL (ref 79.3–98.0)
MONO#: 1.2 10*3/uL — AB (ref 0.1–0.9)
MONO%: 12.1 % (ref 0.0–14.0)
NEUT%: 78.9 % — AB (ref 39.0–75.0)
NEUTROS ABS: 7.8 10*3/uL — AB (ref 1.5–6.5)
PLATELETS: 307 10*3/uL (ref 140–400)
RBC: 2.48 10*6/uL — AB (ref 4.20–5.82)
RDW: 17.5 % — ABNORMAL HIGH (ref 11.0–14.6)
WBC: 10 10*3/uL (ref 4.0–10.3)
lymph#: 0.9 10*3/uL (ref 0.9–3.3)

## 2015-08-19 LAB — COMPREHENSIVE METABOLIC PANEL
ALT: 27 U/L (ref 0–55)
AST: 50 U/L — ABNORMAL HIGH (ref 5–34)
Albumin: 1.8 g/dL — ABNORMAL LOW (ref 3.5–5.0)
Alkaline Phosphatase: 156 U/L — ABNORMAL HIGH (ref 40–150)
Anion Gap: 12 mEq/L — ABNORMAL HIGH (ref 3–11)
BUN: 39.4 mg/dL — ABNORMAL HIGH (ref 7.0–26.0)
CO2: 23 mEq/L (ref 22–29)
Calcium: 6.2 mg/dL — CL (ref 8.4–10.4)
Chloride: 102 mEq/L (ref 98–109)
Creatinine: 4.5 mg/dL (ref 0.7–1.3)
EGFR: 15 mL/min/{1.73_m2} — ABNORMAL LOW (ref >90)
Glucose: 97 mg/dl (ref 70–140)
Potassium: 4.3 mEq/L (ref 3.5–5.1)
Sodium: 137 mEq/L (ref 136–145)
Total Bilirubin: 0.3 mg/dL (ref 0.20–1.20)
Total Protein: 6.4 g/dL (ref 6.4–8.3)

## 2015-08-19 LAB — PREPARE RBC (CROSSMATCH)

## 2015-08-19 MED ORDER — DIPHENHYDRAMINE HCL 25 MG PO CAPS
25.0000 mg | ORAL_CAPSULE | Freq: Once | ORAL | Status: AC
  Administered 2015-08-19: 25 mg via ORAL

## 2015-08-19 MED ORDER — ACYCLOVIR 200 MG PO CAPS: 400.0000 mg | ORAL_CAPSULE | Freq: Every day | ORAL | Status: DC

## 2015-08-19 MED ORDER — ACETAMINOPHEN 325 MG PO TABS
650.0000 mg | ORAL_TABLET | Freq: Once | ORAL | Status: AC
  Administered 2015-08-19: 650 mg via ORAL

## 2015-08-19 MED ORDER — PROCHLORPERAZINE MALEATE 10 MG PO TABS
10.0000 mg | ORAL_TABLET | Freq: Once | ORAL | Status: AC
  Administered 2015-08-19: 10 mg via ORAL

## 2015-08-19 MED ORDER — DEXAMETHASONE SODIUM PHOSPHATE 100 MG/10ML IJ SOLN
10.0000 mg | Freq: Once | INTRAMUSCULAR | Status: AC
  Administered 2015-08-19: 10 mg via INTRAVENOUS
  Filled 2015-08-19: qty 1

## 2015-08-19 MED ORDER — PROCHLORPERAZINE MALEATE 10 MG PO TABS
ORAL_TABLET | ORAL | Status: AC
  Filled 2015-08-19: qty 1

## 2015-08-19 MED ORDER — SODIUM CHLORIDE 0.9 % IV SOLN
250.0000 mL | Freq: Once | INTRAVENOUS | Status: AC
  Administered 2015-08-19: 250 mL via INTRAVENOUS

## 2015-08-19 MED ORDER — DIPHENHYDRAMINE HCL 25 MG PO CAPS
ORAL_CAPSULE | ORAL | Status: AC
  Filled 2015-08-19: qty 1

## 2015-08-19 MED ORDER — ACETAMINOPHEN 325 MG PO TABS
ORAL_TABLET | ORAL | Status: AC
  Filled 2015-08-19: qty 2

## 2015-08-19 MED ORDER — DEXTROSE 5 % IV SOLN
27.0000 mg/m2 | Freq: Once | INTRAVENOUS | Status: AC
  Administered 2015-08-19: 46 mg via INTRAVENOUS
  Filled 2015-08-19: qty 23

## 2015-08-19 MED FILL — ACYCLOVIR 200 MG CAPSULE: 200 | 30 days supply | Qty: 60 | Fill #0

## 2015-08-19 NOTE — Progress Notes (Signed)
OK to treat with labs today and will transfuse one unit of RBC's today too

## 2015-08-19 NOTE — Progress Notes (Signed)
Per Dr. Jana Hakim, transfuse 1 unit.  Order entered.  Lab to send hold tube to blood bank.  HAR verified.  Infusion charge RN, Erline Levine - ok to add on to infusion today.

## 2015-08-19 NOTE — Patient Instructions (Signed)
[facility] Discharge Instructions for Patients Receiving Chemotherapy  Today you received the following chemotherapy agents Kyprolis.  To help prevent nausea and vomiting after your treatment, we encourage you to take your nausea medication as directed.  If you develop nausea and vomiting that is not controlled by your nausea medication, call the clinic.   BELOW ARE SYMPTOMS THAT SHOULD BE REPORTED IMMEDIATELY:  *FEVER GREATER THAN 100.5 F  *CHILLS WITH OR WITHOUT FEVER  NAUSEA AND VOMITING THAT IS NOT CONTROLLED WITH YOUR NAUSEA MEDICATION  *UNUSUAL SHORTNESS OF BREATH  *UNUSUAL BRUISING OR BLEEDING  TENDERNESS IN MOUTH AND THROAT WITH OR WITHOUT PRESENCE OF ULCERS  *URINARY PROBLEMS  *BOWEL PROBLEMS  UNUSUAL RASH Items with * indicate a potential emergency and should be followed up as soon as possible.  Feel free to call the clinic you have any questions or concerns. The clinic phone number is [phone].  Please show the [place] at check-in to the Emergency Department and triage nurse.  Blood Transfusion, Care After Refer to this sheet in the next few weeks. These instructions provide you with information about caring for yourself after your procedure. Your health care provider may also give you more specific instructions. Your treatment has been planned according to current medical practices, but problems sometimes occur. Call your health care provider if you have any problems or questions after your procedure. WHAT TO EXPECT AFTER THE PROCEDURE After your procedure, it is common to have:  Bruising and soreness at the IV site.  Chills or fever.  Headache. HOME CARE INSTRUCTIONS  Take medicines only as directed by your health care provider. Ask your health care provider if you can take an over-the-counter pain reliever in case you have a fever or headache a day or two after your transfusion.  Return to your normal activities as directed by  your health care provider. SEEK MEDICAL CARE IF:   You develop redness or irritation at your IV site.  You have persistent fever, chills, or headache.  Your urine is darker than normal.  Your urine turns pink, red, or brown.   The white part of your eye turns yellow (jaundice).   You feel weak after doing your normal activities.  SEEK IMMEDIATE MEDICAL CARE IF:   You have trouble breathing.  You have fever and chills along with:  Anxiety.  Chest or back pain.  Flushed skin.  Clammy skin.  A rapid heartbeat.  Nausea.   This information is not intended to replace advice given to you by your health care provider. Make sure you discuss any questions you have with your health care provider.   Document Released: 02/07/2014 Document Reviewed: 02/07/2014 Elsevier Interactive Patient Education [employer].

## 2015-08-19 NOTE — Progress Notes (Signed)
Here his progress   [place]  [phone] [phone]    ID: Chez Bulnes DOB: [birth year]  MR#: [id number]  LXB#:[id number]  Patient Care Team: Tresa Garter, MD as PCP - General (Internal Medicine) [name], NP as Nurse Practitioner (Hematology and Oncology) [name], MD as Consulting Physician (Oncology) PCP: [name], MD GYN: SU:  OTHER MD:  CHIEF COMPLAINT: multiple myeloma  CURRENT TREATMENT:   Denosumab/Xgeva; carfilzomib, dexamethasone  HISTORY  OF MULTIPLE MYELOMA: From the original intake note January 2016:  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 The pain was worse with coughing. Exam was unremarkable, and he was treated with Mobic. No labs were obtained. On 01/30/2014 he presented to the emergency room at [facility] with similar complaints and also reported a 10 pound weight loss over the past month. A chest x-ray was significant only for mild atelectasis, but ACT NGO of the chest 01/30/2014, while it showed no pulmonary emboli, showed an osteolytic lesion destroying the manubrium of the sternum. There was extra osseous extension into the anterior mediastinum.  The patient was admitted and found to have a creatinine of 1.64 with a GFR of 51. Bilateral renal ultrasound showed no hydronephrosis or masses, and normal size and cortical thickness of both kidneys, though there was mild diffuse echogenicity. Urine total protein was 145 mg/dL and immunofixation showed a monoclonal IgG kappa protein as well as monoclonal free kappa light chains. On 01/31/2014 serum protein electrophoresis showed an M spike of 2.22 g/dL with a total protein of 8.3 and albumin 3.4. Kappa lambda light chains from the serum obtained 02/04/2014 showed 10.50 mg/dL on the, 1.74 in the lambda,  with an elevated ratio at 6.03. Beta-2 microglobulin was 7.36. LDH was normal at 184 and HIV antibody was nonreactive.  Biopsy of the manubrial mass 02/03/2014 showed extensive infiltration of the bone by sheets of atypical plasma cells, positive for CD138, kappa restricted (SZA 16-23). Right iliac bone marrow biopsy [name] 05/21/2014 (FZB 16-1) showed an average of 12% plasma cells with numerous aggregates and sheets of atypical plasma cells, which were kappa restricted. Cytogenetic analysis is pending  The patient's subsequent history is as detailed below  INTERVAL HISTORY: Daeron returns today for follow-up of his advanced multiple myeloma. The patient remains on hemodialysis on Tuesdays, Thursdays, and Saturdays. He has a fistula in the left upper arm. He continues on carfilzomib, which he appears to be tolerating well. If he receives carfilzomib on a hemodialysis day he receives it after hemodialysis. He continues on 20 mg of dexamethasone on Sundays and Mondays.  REVIEW OF SYSTEMS: Cline feels fatigued. He has not had any fevers or chills. On hemodialysis days there is little that he can do except rest. On the "off" days he feels a little bit more normal but still rarely leaves the house at this point. His pain is controlled with Ultram as needed. He is not constipated from these medications. There have been no unusual headaches, visual changes, nausea, or vomiting a detailed review of systems today was otherwise stable  PAST MEDICAL HISTORY: Past Medical History  Diagnosis Date  . Lytic bone lesions on xray   . Hypertension   . GERD (gastroesophageal reflux disease)   . Neuromuscular disorder ([place])     states both arms are shaking a lot  .  ESRD (end stage renal disease) on dialysis ([place])     [address] [facility] (08/06/2015)  . Kidney stones   . History of blood transfusion 07/2015    "related to his kidneys"  . Headache     "monthly" (08/06/2015)  . IgG myeloma ([place])     PAST SURGICAL  HISTORY: Past Surgical History  Procedure Laterality Date  . Insertion of dialysis catheter Right ~ 07/2015    chest  . Av fistula placement Left 07/13/2015    Procedure: CREATION OF LEFT UPPER ARM  ARTERIOVENOUS (AV) FISTULA ;  Surgeon: Elam Dutch, MD;  Location: [facility] OR;  Service: Vascular;  Laterality: Left;    FAMILY HISTORY Family History  Problem Relation Age of Onset  . Diabetes Father    The patient's parents are still living, in their late 9's. The patient has 2 brothers, 3 sisters. There is noc cancer in the fmaily to his knowledge.  SOCIAL HISTORY:  Originally from [facility], moved here 19 years ago. He works in Nurse, adult. Married (wife's name is [name]), lives in [place]. He has 5 children ages 15, 68, 76, 57 and 64 months [name], [name], [name] and Frederick) in good health. Best number to call him is [phone]    ADVANCED DIRECTIVES: not  In place   HEALTH MAINTENANCE: Social History  Substance Use Topics  . Smoking status: Never Smoker   . Smokeless tobacco: Never Used  . Alcohol Use: No     Colonoscopy:  PSA:  Bone density:  Lipid panel:  Allergies  Allergen Reactions  . Ciprofloxacin Rash  . Mobic [Meloxicam] Rash    Current Outpatient Prescriptions  Medication Sig Dispense Refill  . acyclovir (ZOVIRAX) 200 MG capsule Take 2 capsules (400 mg total) by mouth daily. 60 capsule 6  . Amino Acids-Protein Hydrolys (FEEDING SUPPLEMENT, PRO-STAT SUGAR FREE 64,) LIQD Take 30 mLs by mouth 2 (two) times daily.    . Cholecalciferol (VITAMIN D PO) Take 500 Units by mouth daily.    Marland Kitchen dexamethasone (DECADRON) 4 MG tablet Take 5 tablets (48m) on Saturday and Sunday every week (t[place]. 60 tablet 4  . ferrous sulfate 325 (65 FE) MG tablet Take 1 tablet (325 mg total) by mouth 2 (two) times daily with a meal. 30 tablet 3  . hydrALAZINE (APRESOLINE) 25 MG tablet Take 1 tablet  (25 mg total) by mouth 3 (three) times daily. 90 tablet 0  . metoprolol (LOPRESSOR) 50 MG tablet Take 1 tablet (50 mg total) by mouth 2 (two) times daily. 60 tablet 0  . mirtazapine (REMERON) 15 MG tablet Take 1 tablet (15 mg total) by mouth at bedtime. 30 tablet 0  . multivitamin (RENA-VIT) TABS tablet Take 1 tablet by mouth at bedtime. 30 tablet 0  . Nutritional Supplements (FEEDING SUPPLEMENT, NEPRO CARB STEADY,) LIQD Take 237 mLs by mouth 2 (two) times daily between meals.    . senna-docusate (SENOKOT-S) 8.6-50 MG tablet Take 2 tablets by mouth 2 (two) times daily. 120 tablet 0  . traMADol (ULTRAM) 50 MG tablet Take 1 tablet (50 mg total) by mouth every 6 (six) hours as needed. (Patient taking differently: Take 50 mg by mouth every 6 (six) hours as needed for moderate pain. ) 60 tablet 5  . calcium acetate (PHOSLO) 667 MG capsule Take 2 capsules (1,334 mg total) by mouth 3 (three) times daily with meals. (Patient not taking: Reported on 08/19/2015)  120 capsule 0  . prochlorperazine (COMPAZINE) 10 MG tablet Take 1 tablet (10 mg total) by mouth every 6 (six) hours as needed for nausea or vomiting. 60 tablet 6   No current facility-administered medications for this visit.    OBJECTIVE: Young Latin manWho appears fatigued Filed Vitals:   08/19/15 1310  BP: 104/65  Pulse: 96  Temp: 99.3 F (37.4 C)  Resp: 18     Body mass index is 21.72 kg/(m^2).    ECOG FS:2 - Symptomatic, <50% confined to bed   Sclerae unicteric, pupils round and equal Oropharynx clear and moist-- no thrush or other lesions No cervical or supraclavicular adenopathy Lungs no rales or rhonchi Heart regular rate and rhythm Abd soft, nontender, positive bowel sounds MSK no focal spinal tenderness, no upper extremity lymphedema, good thrill left upper arm Neuro: nonfocal, well oriented, appropriate affect  LABS:  Kappa/Lambda FluidC Ratio 0.26 - 1.65  8.01 (H) 29.83 (H) 13.72 (H) 6.83 (H)        M Protein SerPl  Elph-Mcnc Not Observed g/dL 0.3 (H)  1.7 (H) 1.8 (H)            CMP     Component Value Date/Time   NA 137 08/19/2015 1228   NA 137 08/10/2015 1127   K 4.3 08/19/2015 1228   K 3.3* 08/10/2015 1127   CL 102 08/10/2015 1127   CO2 23 08/19/2015 1228   CO2 27 08/10/2015 1127   GLUCOSE 97 08/19/2015 1228   GLUCOSE 97 08/10/2015 1127   BUN 39.4* 08/19/2015 1228   BUN 52* 08/10/2015 1127   CREATININE 4.5* 08/19/2015 1228   CREATININE 5.68* 08/10/2015 1127   CALCIUM 6.2* 08/19/2015 1228   CALCIUM 7.4* 08/10/2015 1127   PROT 6.4 08/19/2015 1228   PROT 6.6 08/12/2015 1320   PROT 4.9* 08/07/2015 0530   ALBUMIN 1.8* 08/19/2015 1228   ALBUMIN 1.5* 08/07/2015 0530   AST 50* 08/19/2015 1228   AST 12* 08/07/2015 0530   ALT 27 08/19/2015 1228   ALT 6* 08/07/2015 0530   ALKPHOS 156* 08/19/2015 1228   ALKPHOS 77 08/07/2015 0530   BILITOT 0.30 08/19/2015 1228   BILITOT 0.5 08/07/2015 0530   GFRNONAA 11* 08/10/2015 1127   GFRAA 13* 08/10/2015 1127    INo results found for: SPEP, UPEP  Lab Results  Component Value Date   WBC 10.0 08/19/2015   NEUTROABS 7.8* 08/19/2015   HGB 7.6* 08/19/2015   HCT 23.4* 08/19/2015   MCV 94.4 08/19/2015   PLT 307 08/19/2015      Chemistry      Component Value Date/Time   NA 137 08/19/2015 1228   NA 137 08/10/2015 1127   K 4.3 08/19/2015 1228   K 3.3* 08/10/2015 1127   CL 102 08/10/2015 1127   CO2 23 08/19/2015 1228   CO2 27 08/10/2015 1127   BUN 39.4* 08/19/2015 1228   BUN 52* 08/10/2015 1127   CREATININE 4.5* 08/19/2015 1228   CREATININE 5.68* 08/10/2015 1127      Component Value Date/Time   CALCIUM 6.2* 08/19/2015 1228   CALCIUM 7.4* 08/10/2015 1127   ALKPHOS 156* 08/19/2015 1228   ALKPHOS 77 08/07/2015 0530   AST 50* 08/19/2015 1228   AST 12* 08/07/2015 0530   ALT 27 08/19/2015 1228   ALT 6* 08/07/2015 0530   BILITOT 0.30 08/19/2015 1228   BILITOT 0.5 08/07/2015 0530       No results found for: LABCA2  No components  found for: [id number]  No results for input(s): INR in the last 168 hours.  Urinalysis    Component Value Date/Time   COLORURINE RED* 08/06/2015 1830   APPEARANCEUR CLOUDY* 08/06/2015 1830   LABSPEC 1.012 08/06/2015 1830   LABSPEC 1.030 01/22/2015 1018   PHURINE 8.0 08/06/2015 1830   PHURINE 5.0 01/22/2015 1018   GLUCOSEU NEGATIVE 08/06/2015 1830   GLUCOSEU Negative 01/22/2015 1018   HGBUR LARGE* 08/06/2015 1830   HGBUR Large 01/22/2015 1018   BILIRUBINUR NEGATIVE 08/06/2015 1830   BILIRUBINUR Negative 01/22/2015 1018   KETONESUR NEGATIVE 08/06/2015 1830   KETONESUR Negative 01/22/2015 1018   PROTEINUR >300* 08/06/2015 1830   PROTEINUR 2000 01/22/2015 1018   UROBILINOGEN 0.2 01/22/2015 1018   UROBILINOGEN 0.2 01/31/2014 1050   NITRITE NEGATIVE 08/06/2015 1830   NITRITE Negative 01/22/2015 1018   LEUKOCYTESUR MODERATE* 08/06/2015 1830   LEUKOCYTESUR Color Interference due to blood in urine 01/22/2015 1018    STUDIES: Dg Chest 2 View  08/06/2015  CLINICAL DATA:  Fever. Chemotherapy treatment for multiple myeloma. Chronic kidney disease. EXAM: CHEST  2 VIEW COMPARISON:  07/07/2015 FINDINGS: Right internal jugular central line has its tips in the right atrium. The heart is enlarged. There is venous hypertension with interstitial edema. There are bilateral pleural effusions with atelectasis in the dependent lungs. Basilar pneumonia not excluded. IMPRESSION: Dialysis catheter unchanged with the tip in the right atrium. Venous hypertension and interstitial edema. Bilateral dependent effusions with basilar atelectasis. Basilar pneumonia not excluded. Electronically Signed   By: Nelson Chimes M.D.   On: 08/06/2015 18:58    ASSESSMENT: 40 y.o. 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 (2.22 g/dL) and urine (145 mg/dL), bone marrow biopsy 02/03/2014 showing an average 12% plasmacytosis, with some sheets of  abnormal plasma cells noted, cytogenetics pending; baseline beta 2 microglobulin was 7.36, baseline creatinine 1.64, with GFR 51, calcium on general 05/21/2014 was 10.8, LDH was normal  (1) Multiple myeloma stage IIA diagnosed January 2016;        I: CyBorD started January 2016  (a) dexamethasone 20 mg/d every TU/WED started 02/04/2014  (b) bortezomib sQ days 1,4,8,11 of every 21 day cycle started 02/10/2014  (c) cyclophosphamide 300 mg/M2 IV weekly started 02/13/2014       2: Lenalidomide 62m daily started 07/26/2014, discontinued December 2016 with progression  (2) 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 The patient is not a transplant candidate for financial reasons  (6) bilateral foot rash: Started on Septra DS and Diflucan 09/03/2014, resolved  (7) Multiple Myeloma relapse in December 2016.   (a) starting 02/05/15: dexamethasone 274mx2 consecutive days weekly; bortezomib sQ weekly,   (b) Septra DS twice daily  (c) acyclovir 40022maily   (d) bortezomib/ dexamethasone discontinued after 05/06/2015 dose with evidence of progression  (8) started carfilzomib 05/28/2015, given with weekly low dose dexamethasone  (a) tolerated cyclophosphamide poorly, discontinued after cycle 1  (b) carfilzomib started 05/28/2015   (i) cycle 2 started 07/01/2015   (ii) cycle 3 to start 08/06/2015; delayed by 1 week due to hospitalization. Started on 08/13/2015.  (c) taking dexamethasone 20  mg every Sunday and Monday  (d) lenalidomide discontinued 07/29/2015  (9) on hemodialysis Tu/Th/Sa as of early June 2017  (10) adjuvant  radiation completed 07/27/2015  PLAN: Mesiah is stable since his last visit. Has not had any recurrent fevers.   He remains very tired particularly on the days that he receives hemodialysis. He is anemic. Will transfuse 1 unit of packed red blood cells this week. He is receiving growth factors and iron at hemodialysis.   He will proceed with cycle 3 day 8 of his carfilzomib today and cycle 3 day 9 tomorrow. Continue dexamethasone 20 mg every Sunday and Monday. We will consider switching him over to a subcutaneous treatment for his myeloma in the near future.  The plan for now is to continue the carfilzomib 3 weeks on and one-week off, and then depending on response, continue, or switch to maintenance.  Kedarius knows to call for any problems that may develop before his next visit here.   Mikey Bussing, NP   08/19/2015 1:54 PM   Mikey Bussing, NP Medical Oncology and Hematology [facility] [address] [place], [place] [address] Tel. [phone]    Fax. [phone]

## 2015-08-20 ENCOUNTER — Ambulatory Visit (HOSPITAL_BASED_OUTPATIENT_CLINIC_OR_DEPARTMENT_OTHER): Payer: Self-pay

## 2015-08-20 ENCOUNTER — Encounter: Payer: Self-pay | Admitting: Vascular Surgery

## 2015-08-20 VITALS — BP 106/76 | HR 79 | Temp 98.3°F | Resp 16

## 2015-08-20 DIAGNOSIS — Z5112 Encounter for antineoplastic immunotherapy: Secondary | ICD-10-CM

## 2015-08-20 DIAGNOSIS — M899 Disorder of bone, unspecified: Secondary | ICD-10-CM

## 2015-08-20 DIAGNOSIS — C9002 Multiple myeloma in relapse: Secondary | ICD-10-CM

## 2015-08-20 DIAGNOSIS — C7951 Secondary malignant neoplasm of bone: Secondary | ICD-10-CM

## 2015-08-20 DIAGNOSIS — G893 Neoplasm related pain (acute) (chronic): Principal | ICD-10-CM

## 2015-08-20 LAB — TYPE AND SCREEN
ABO/RH(D): O POS
Antibody Screen: NEGATIVE
UNIT DIVISION: 0

## 2015-08-20 MED ORDER — SODIUM CHLORIDE 0.9 % IV SOLN
10.0000 mg | Freq: Once | INTRAVENOUS | Status: AC
  Administered 2015-08-20: 10 mg via INTRAVENOUS
  Filled 2015-08-20: qty 1

## 2015-08-20 MED ORDER — PROCHLORPERAZINE MALEATE 10 MG PO TABS
10.0000 mg | ORAL_TABLET | Freq: Once | ORAL | Status: AC
  Administered 2015-08-20: 10 mg via ORAL

## 2015-08-20 MED ORDER — DEXTROSE 5 % IV SOLN
27.0000 mg/m2 | Freq: Once | INTRAVENOUS | Status: AC
  Administered 2015-08-20: 46 mg via INTRAVENOUS
  Filled 2015-08-20: qty 23

## 2015-08-20 MED ORDER — PROCHLORPERAZINE MALEATE 10 MG PO TABS
ORAL_TABLET | ORAL | Status: AC
  Filled 2015-08-20: qty 1

## 2015-08-26 ENCOUNTER — Other Ambulatory Visit (HOSPITAL_BASED_OUTPATIENT_CLINIC_OR_DEPARTMENT_OTHER): Payer: Self-pay

## 2015-08-26 ENCOUNTER — Ambulatory Visit (HOSPITAL_BASED_OUTPATIENT_CLINIC_OR_DEPARTMENT_OTHER): Payer: Self-pay | Admitting: Oncology

## 2015-08-26 ENCOUNTER — Ambulatory Visit (HOSPITAL_BASED_OUTPATIENT_CLINIC_OR_DEPARTMENT_OTHER): Payer: Self-pay

## 2015-08-26 ENCOUNTER — Other Ambulatory Visit: Payer: Self-pay

## 2015-08-26 ENCOUNTER — Telehealth: Payer: Self-pay | Admitting: Oncology

## 2015-08-26 VITALS — BP 130/83 | HR 108 | Temp 98.2°F | Resp 18 | Ht 65.0 in | Wt 132.5 lb

## 2015-08-26 DIAGNOSIS — Z992 Dependence on renal dialysis: Secondary | ICD-10-CM

## 2015-08-26 DIAGNOSIS — C9002 Multiple myeloma in relapse: Secondary | ICD-10-CM

## 2015-08-26 DIAGNOSIS — C7951 Secondary malignant neoplasm of bone: Secondary | ICD-10-CM

## 2015-08-26 DIAGNOSIS — G893 Neoplasm related pain (acute) (chronic): Secondary | ICD-10-CM

## 2015-08-26 DIAGNOSIS — C9000 Multiple myeloma not having achieved remission: Secondary | ICD-10-CM

## 2015-08-26 DIAGNOSIS — M899 Disorder of bone, unspecified: Secondary | ICD-10-CM

## 2015-08-26 DIAGNOSIS — Z5112 Encounter for antineoplastic immunotherapy: Secondary | ICD-10-CM

## 2015-08-26 DIAGNOSIS — N179 Acute kidney failure, unspecified: Secondary | ICD-10-CM

## 2015-08-26 DIAGNOSIS — G4700 Insomnia, unspecified: Secondary | ICD-10-CM

## 2015-08-26 DIAGNOSIS — N186 End stage renal disease: Secondary | ICD-10-CM

## 2015-08-26 LAB — CBC WITH DIFFERENTIAL/PLATELET
BASO%: 0.2 % (ref 0.0–2.0)
BASOS ABS: 0 10*3/uL (ref 0.0–0.1)
EOS%: 0.2 % (ref 0.0–7.0)
Eosinophils Absolute: 0 10*3/uL (ref 0.0–0.5)
HEMATOCRIT: 30.8 % — AB (ref 38.4–49.9)
HEMOGLOBIN: 9.7 g/dL — AB (ref 13.0–17.1)
LYMPH#: 1.1 10*3/uL (ref 0.9–3.3)
LYMPH%: 8.8 % — ABNORMAL LOW (ref 14.0–49.0)
MCH: 30.1 pg (ref 27.2–33.4)
MCHC: 31.5 g/dL — ABNORMAL LOW (ref 32.0–36.0)
MCV: 95.7 fL (ref 79.3–98.0)
MONO#: 1.3 10*3/uL — ABNORMAL HIGH (ref 0.1–0.9)
MONO%: 10 % (ref 0.0–14.0)
NEUT#: 10.3 10*3/uL — ABNORMAL HIGH (ref 1.5–6.5)
NEUT%: 80.8 % — AB (ref 39.0–75.0)
PLATELETS: 325 10*3/uL (ref 140–400)
RBC: 3.22 10*6/uL — ABNORMAL LOW (ref 4.20–5.82)
RDW: 18.3 % — AB (ref 11.0–14.6)
WBC: 12.8 10*3/uL — ABNORMAL HIGH (ref 4.0–10.3)
nRBC: 1 % — ABNORMAL HIGH (ref 0–0)

## 2015-08-26 LAB — COMPREHENSIVE METABOLIC PANEL
ALT: 18 U/L (ref 0–55)
ANION GAP: 14 meq/L — AB (ref 3–11)
AST: 14 U/L (ref 5–34)
Albumin: 1.8 g/dL — ABNORMAL LOW (ref 3.5–5.0)
Alkaline Phosphatase: 150 U/L (ref 40–150)
BUN: 51.3 mg/dL — ABNORMAL HIGH (ref 7.0–26.0)
CALCIUM: 6.1 mg/dL — AB (ref 8.4–10.4)
CHLORIDE: 101 meq/L (ref 98–109)
CO2: 25 meq/L (ref 22–29)
CREATININE: 5 mg/dL — AB (ref 0.7–1.3)
EGFR: 13 mL/min/{1.73_m2} — AB (ref >90)
Glucose: 102 mg/dl (ref 70–140)
POTASSIUM: 4.4 meq/L (ref 3.5–5.1)
Sodium: 141 mEq/L (ref 136–145)
Total Bilirubin: 0.33 mg/dL (ref 0.20–1.20)
Total Protein: 6.7 g/dL (ref 6.4–8.3)

## 2015-08-26 LAB — TECHNOLOGIST REVIEW

## 2015-08-26 MED ORDER — DEXTROSE 5 % IV SOLN
27.0000 mg/m2 | Freq: Once | INTRAVENOUS | Status: AC
  Administered 2015-08-26: 46 mg via INTRAVENOUS
  Filled 2015-08-26: qty 23

## 2015-08-26 MED ORDER — SODIUM CHLORIDE 0.9 % IV SOLN
10.0000 mg | Freq: Once | INTRAVENOUS | Status: AC
  Administered 2015-08-26: 10 mg via INTRAVENOUS
  Filled 2015-08-26: qty 1

## 2015-08-26 MED ORDER — LORAZEPAM 0.5 MG PO TABS: 0.5000 mg | ORAL_TABLET | Freq: Every evening | ORAL | 0 refills | Status: DC | PRN

## 2015-08-26 MED ORDER — PROCHLORPERAZINE MALEATE 10 MG PO TABS
10.0000 mg | ORAL_TABLET | Freq: Once | ORAL | Status: AC
  Administered 2015-08-26: 10 mg via ORAL

## 2015-08-26 MED ORDER — DEXTROSE 5 % IV SOLN: 27.0000 mg/m2 | Freq: Once | INTRAVENOUS | Status: DC

## 2015-08-26 MED ORDER — PROCHLORPERAZINE MALEATE 10 MG PO TABS
ORAL_TABLET | ORAL | Status: AC
  Filled 2015-08-26: qty 1

## 2015-08-26 MED ORDER — MELPHALAN 2 MG PO TABS: 6.0000 mg | ORAL_TABLET | Freq: Every day | ORAL | 6 refills | Status: DC

## 2015-08-26 MED ORDER — SODIUM CHLORIDE 0.9 % IV SOLN
INTRAVENOUS | Status: DC
  Administered 2015-08-26: 11:00:00 via INTRAVENOUS

## 2015-08-26 MED ORDER — MORPHINE SULFATE ER 15 MG PO TBCR: 15.0000 mg | EXTENDED_RELEASE_TABLET | Freq: Two times a day (BID) | ORAL | 0 refills | Status: DC

## 2015-08-26 NOTE — Progress Notes (Signed)
Here his progress   [place]  [phone] [phone]    ID: Aristeo Hankerson DOB: December 10, 1975  MR#: [id number]  PIR#:[id number]  Patient Care Team: Tresa Garter, MD as PCP - General (Internal Medicine) [name], NP as Nurse Practitioner (Hematology and Oncology) [name], MD as Consulting Physician (Oncology) PCP: [name], MD GYN: SU:  OTHER MD:  CHIEF COMPLAINT: multiple myeloma  CURRENT TREATMENT:   Denosumab/Xgeva; carfilzomib, dexamethasone  HISTORY  OF MULTIPLE MYELOMA: From the original intake note January 2016:  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 The pain was worse with coughing. Exam was unremarkable, and he was treated with Mobic. No labs were obtained. On 01/30/2014 he presented to the emergency room at [facility] with similar complaints and also reported a 10 pound weight loss over the past month. A chest x-ray was significant only for mild atelectasis, but ACT NGO of the chest 01/30/2014, while it showed no pulmonary emboli, showed an osteolytic lesion destroying the manubrium of the sternum. There was extra osseous extension into the anterior mediastinum.  The patient was admitted and found to have a creatinine of 1.64 with a GFR of 51. Bilateral renal ultrasound showed no hydronephrosis or masses, and normal size and cortical thickness of both kidneys, though there was mild diffuse echogenicity. Urine total protein was 145 mg/dL and immunofixation showed a monoclonal IgG kappa protein as well as monoclonal free kappa light chains. On 01/31/2014 serum protein electrophoresis showed an M spike of 2.22 g/dL with a total protein of 8.3 and albumin 3.4. Kappa lambda light chains from the serum obtained 02/04/2014 showed 10.50 mg/dL on the, 1.74 in the lambda,  with an elevated ratio at 6.03. Beta-2 microglobulin was 7.36. LDH was normal at 184 and HIV antibody was nonreactive.  Biopsy of the manubrial mass 02/03/2014 showed extensive infiltration of the bone by sheets of atypical plasma cells, positive for CD138, kappa restricted (SZA 16-23). Right iliac bone marrow biopsy [name] 05/21/2014 (FZB 16-1) showed an average of 12% plasma cells with numerous aggregates and sheets of atypical plasma cells, which were kappa restricted. Cytogenetic analysis is pending  The patient's subsequent history is as detailed below  INTERVAL HISTORY: Jahid returns today for follow-up of his multiple myeloma, accompanied by his wife [place]. Maximum of receives hemodialysis on Tuesdays, Thursdays, and Saturdays. We are giving him carfilzomib and he is receiving the day 15 dose today of the current cycle. He is also on dexamethasone on Sundays and Mondays, and will receive denosumab every 12 weeks, with the next dose due in October   REVIEW OF SYSTEMS: Arman continues to feel exhausted. He feels a bit better on the day's when he is not receiving hemodialysis. He had a transfusion here 08/03/2015 which he tolerated well. He has run out of his morphine and is having more pain. He finds the Ultram does not quite take care of the problem. He had a little bit of shortness of breath last night, but feels better this morning. He denies any fever, bleeding, or rash. A detailed review of systems today was otherwise stable  PAST MEDICAL HISTORY: Past Medical History:  Diagnosis Date  . ESRD (end stage renal disease) on dialysis ([place])    [address] T TH S (08/06/2015)  . GERD (gastroesophageal reflux disease)   . Headache    "  monthly" (08/06/2015)  . History of blood transfusion 07/2015   "related to his kidneys"  . Hypertension   . IgG myeloma ([place])   . Kidney stones   . Lytic bone lesions on xray   . Neuromuscular disorder ([place])    states both arms are shaking a lot     PAST SURGICAL HISTORY: Past Surgical History:  Procedure Laterality Date  . AV FISTULA PLACEMENT Left 07/13/2015   Procedure: CREATION OF LEFT UPPER ARM  ARTERIOVENOUS (AV) FISTULA ;  Surgeon: Elam Dutch, MD;  Location: [place];  Service: Vascular;  Laterality: Left;  . INSERTION OF DIALYSIS CATHETER Right ~ 07/2015   chest    FAMILY HISTORY Family History  Problem Relation Age of Onset  . Diabetes Father    The patient's parents are still living, in their late 32's. The patient has 2 brothers, 3 sisters. There is noc cancer in the fmaily to his knowledge.  SOCIAL HISTORY:  Originally from [facility], moved here 19 years ago. He works in Nurse, adult. Married (wife's name is [name]), lives in [place]. He has 5 children ages 63, 27, 86, 21 and 51 months [name], [name], [name] and Verlot) in good health. Best number to call him is [phone]    ADVANCED DIRECTIVES: not  In place   HEALTH MAINTENANCE: Social History  Substance Use Topics  . Smoking status: Never Smoker  . Smokeless tobacco: Never Used  . Alcohol use No     Colonoscopy:  PSA:  Bone density:  Lipid panel:  Allergies  Allergen Reactions  . Ciprofloxacin Rash  . Mobic [Meloxicam] Rash    Current Outpatient Prescriptions  Medication Sig Dispense Refill  . acyclovir (ZOVIRAX) 200 MG capsule Take 2 capsules (400 mg total) by mouth daily. 60 capsule 6  . Amino Acids-Protein Hydrolys (FEEDING SUPPLEMENT, PRO-STAT SUGAR FREE 64,) LIQD Take 30 mLs by mouth 2 (two) times daily.    . calcium acetate (PHOSLO) 667 MG capsule Take 2 capsules (1,334 mg total) by mouth 3 (three) times daily with meals. (Patient not taking: Reported on 08/19/2015) 120 capsule 0  . Cholecalciferol (VITAMIN D PO) Take 500 Units by mouth daily.    Marland Kitchen dexamethasone (DECADRON) 4 MG tablet Take 5 tablets (57m) on Saturday and Sunday every week (t[place].  60 tablet 4  . ferrous sulfate 325 (65 FE) MG tablet Take 1 tablet (325 mg total) by mouth 2 (two) times daily with a meal. 30 tablet 3  . hydrALAZINE (APRESOLINE) 25 MG tablet Take 1 tablet (25 mg total) by mouth 3 (three) times daily. 90 tablet 0  . LORazepam (ATIVAN) 0.5 MG tablet Take 1 tablet (0.5 mg total) by mouth at bedtime as needed for anxiety. 30 tablet 0  . metoprolol (LOPRESSOR) 50 MG tablet Take 1 tablet (50 mg total) by mouth 2 (two) times daily. 60 tablet 0  . mirtazapine (REMERON) 15 MG tablet Take 1 tablet (15 mg total) by mouth at bedtime. 30 tablet 0  . morphine (MS CONTIN) 15 MG 12 hr tablet Take 1 tablet (15 mg total) by mouth every 12 (twelve) hours. 60 tablet 0  . multivitamin (RENA-VIT) TABS tablet Take 1 tablet by mouth at bedtime. 30 tablet 0  . Nutritional Supplements (FEEDING SUPPLEMENT, NEPRO CARB STEADY,) LIQD Take 237 mLs by mouth 2 (two) times daily between meals.    . prochlorperazine (COMPAZINE) 10 MG tablet Take 1 tablet (10  mg total) by mouth every 6 (six) hours as needed for nausea or vomiting. 60 tablet 6  . senna-docusate (SENOKOT-S) 8.6-50 MG tablet Take 2 tablets by mouth 2 (two) times daily. 120 tablet 0  . traMADol (ULTRAM) 50 MG tablet Take 1 tablet (50 mg total) by mouth every 6 (six) hours as needed. (Patient taking differently: Take 50 mg by mouth every 6 (six) hours as needed for moderate pain. ) 60 tablet 5   No current facility-administered medications for this visit.     OBJECTIVE: Young Spanish speaker who appears stated age 82:   08/26/15 1020  BP: 130/83  Pulse: (!) 108  Resp: 18  Temp: 98.2 F (36.8 C)     Body mass index is 22.05 kg/m.    ECOG FS:2 - Symptomatic, <50% confined to bed   Sclerae unicteric, EOMs intact Oropharynx clear and moist No cervical or supraclavicular adenopathy Lungs no rales or rhonchi Heart regular rate and rhythm Abd soft, nontender, positive bowel sounds MSK no focal spinal tenderness, no upper  extremity lymphedema Neuro: nonfocal, well oriented, appropriate affect   LABS: CMP     Component Value Date/Time   NA 141 08/26/2015 0942   K 4.4 08/26/2015 0942   CL 102 08/10/2015 1127   CO2 25 08/26/2015 0942   GLUCOSE 102 08/26/2015 0942   BUN 51.3 (H) 08/26/2015 0942   CREATININE 5.0 (HH) 08/26/2015 0942   CALCIUM 6.1 (LL) 08/26/2015 0942   PROT 6.7 08/26/2015 0942   ALBUMIN 1.8 (L) 08/26/2015 0942   AST 14 08/26/2015 0942   ALT 18 08/26/2015 0942   ALKPHOS 150 08/26/2015 0942   BILITOT 0.33 08/26/2015 0942   GFRNONAA 11 (L) 08/10/2015 1127   GFRAA 13 (L) 08/10/2015 1127    INo results found for: SPEP, UPEP  Lab Results  Component Value Date   WBC 12.8 (H) 08/26/2015   NEUTROABS 10.3 (H) 08/26/2015   HGB 9.7 (L) 08/26/2015   HCT 30.8 (L) 08/26/2015   MCV 95.7 08/26/2015   PLT 325 08/26/2015      Chemistry      Component Value Date/Time   NA 141 08/26/2015 0942   K 4.4 08/26/2015 0942   CL 102 08/10/2015 1127   CO2 25 08/26/2015 0942   BUN 51.3 (H) 08/26/2015 0942   CREATININE 5.0 (HH) 08/26/2015 0942      Component Value Date/Time   CALCIUM 6.1 (LL) 08/26/2015 0942   ALKPHOS 150 08/26/2015 0942   AST 14 08/26/2015 0942   ALT 18 08/26/2015 0942   BILITOT 0.33 08/26/2015 0942     2wk ago 75moago 23175mogo 14m8175moo 75mo17mo      Ig Kappa Free Light Chain 3.3 - 19.4 mg/L 172.7  183.5CM  226.10R, CM  79.60R  40.58R    Ig Lambda Free Light Chain 5.7 - 26.3 mg/L 30.5  22.9CM 7.58R, CM 5.80R 5.94R   Kappa/Lambda FluidC Ratio 0.26 - 1.65 5.66  8.01  29.83  13.72  6.83    Protein SerPl Elph-Mcnc Not Observed g/dL 1.4  0.3   1.7  1.8      No results found for: LABCA2  No components found for: [id number] results for input(s): INR in the last 168 hours.  Urinalysis    Component Value Date/Time   COLORURINE RED (A) 08/06/2015 1830   APPEARANCEUR CLOUDY (A) 08/06/2015 1830   LABSPEC 1.012 08/06/2015 1830   LABSPEC 1.030 01/22/2015 1018   PHURINE 8.0  08/06/2015  [place] 08/06/2015 1830   GLUCOSEU Negative 01/22/2015 1018   HGBUR LARGE (A) 08/06/2015 [place] 08/06/2015 1830   BILIRUBINUR Negative 01/22/2015 1018   [place] 08/06/2015 1830   PROTEINUR >300 (A) 08/06/2015 1830   UROBILINOGEN 0.2 01/22/2015 1018   NITRITE NEGATIVE 08/06/2015 1830   LEUKOCYTESUR MODERATE (A) 08/06/2015 1830   LEUKOCYTESUR Color Interference due to blood in urine 01/22/2015 1018    STUDIES: Dg Chest 2 View  Result Date: 08/06/2015 CLINICAL DATA:  Fever. Chemotherapy treatment for multiple myeloma. Chronic kidney disease. EXAM: CHEST  2 VIEW COMPARISON:  07/07/2015 FINDINGS: Right internal jugular central line has its tips in the right atrium. The heart is enlarged. There is venous hypertension with interstitial edema. There are bilateral pleural effusions with atelectasis in the dependent lungs. Basilar pneumonia not excluded. IMPRESSION: Dialysis catheter unchanged with the tip in the right atrium. Venous hypertension and interstitial edema. Bilateral dependent effusions with basilar atelectasis. Basilar pneumonia not excluded. Electronically Signed   By: Nelson Chimes M.D.   On: 08/06/2015 18:58    ASSESSMENT: 40 y.o. 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;        I: CyBorD started January 2016  (a) dexamethasone 20 mg/d every TU/WED started 02/04/2014  (b) bortezomib sQ days 1,4,8,11 of every 21 day cycle started 02/10/2014  (c) cyclophosphamide 300 mg/M2 IV weekly  started 02/13/2014       2: Lenalidomide 32m daily started 07/26/2014, discontinued December 2016 with progression  (2) 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 The patient is not a transplant candidate for financial reasons  (6) bilateral foot rash: Started on Septra DS and Diflucan 09/03/2014, resolved  (7) Multiple Myeloma relapse in December 2016.   (a) starting 02/05/15: dexamethasone 263mx2 consecutive days weekly; bortezomib sQ weekly,   (b) Septra DS twice daily  (c) acyclovir 400100maily   (d) bortezomib/ dexamethasone discontinued after 05/06/2015 dose with evidence of progression  (8) started carfilzomib 05/28/2015, given with weekly low dose dexamethasone  (a) tolerated cyclophosphamide poorly, discontinued after cycle 1  (b) carfilzomib started 05/28/2015   (i) cycle 2 started 07/01/2015   (ii) cycle 3 delayed by 1 week due to hospitalization. Started on 08/13/2015.   (iii) cycle 4 to start 09/09/2015  (c) taking dexamethasone 20 mg every Sunday and Monday  (d) lenalidomide discontinued 07/29/2015  (9) on hemodialysis Tu/Th/Sa as of early June 2017  (10) adjuvant radiation completed 07/27/2015  PLAN: Binnie tolerates the carfilzomib well. He does not feel it adds to the fatigue that he experiences from hemodialysis. Accordingly we are going to continue this medication at least for another couple of months since it appears to be working for him: the kappa/lambda ratio continues  to decrease.  At some point, when we reach blood toe, we will consider switching to melphalan ticks milligrams daily 2  weeks on one or 2 weeks off.  He has run out of morphine. I have refilled morphine 15 mg continuous release to take twice daily. He will use Ultram for breakthrough pain. He understands the importance of maintaining a good bowel prophylaxis program.  He is also having insomnia from the dexamethasone on Sunday and Monday. I have added lorazepam to help him sleep most nights.  He will complete the current cycle tomorrow. He will return in 2 weeks for the start of his next cycle of carfilzomib. He knows to call for any problems that may develop before that visit.   [name], MD   08/26/2015 11:03 AM   [name], MD Medical Oncology and Hematology [facility] [address] [place], [place] [address] Tel. [phone]    Fax. [phone]

## 2015-08-26 NOTE — Telephone Encounter (Signed)
appt made and avs printed °

## 2015-08-27 ENCOUNTER — Encounter: Payer: Self-pay | Admitting: Adult Health

## 2015-08-27 ENCOUNTER — Encounter: Payer: Self-pay | Admitting: Vascular Surgery

## 2015-08-27 ENCOUNTER — Ambulatory Visit (INDEPENDENT_AMBULATORY_CARE_PROVIDER_SITE_OTHER): Payer: Self-pay | Admitting: Vascular Surgery

## 2015-08-27 ENCOUNTER — Ambulatory Visit (HOSPITAL_BASED_OUTPATIENT_CLINIC_OR_DEPARTMENT_OTHER): Payer: Self-pay

## 2015-08-27 ENCOUNTER — Encounter: Payer: Self-pay | Admitting: Radiation Oncology

## 2015-08-27 ENCOUNTER — Telehealth: Payer: Self-pay | Admitting: *Deleted

## 2015-08-27 ENCOUNTER — Ambulatory Visit
Admission: RE | Admit: 2015-08-27 | Discharge: 2015-08-27 | Disposition: A | Discharge status: Home / Self Care | Payer: MEDICAID | Source: Ambulatory Visit | Attending: Radiation Oncology | Admitting: Radiation Oncology

## 2015-08-27 VITALS — BP 123/82 | HR 116 | Temp 99.0°F | Resp 18 | Ht 65.0 in | Wt 127.5 lb

## 2015-08-27 VITALS — BP 112/74 | HR 112 | Temp 98.5°F | Resp 16 | Ht 65.0 in | Wt 128.8 lb

## 2015-08-27 DIAGNOSIS — M899 Disorder of bone, unspecified: Secondary | ICD-10-CM

## 2015-08-27 DIAGNOSIS — Z992 Dependence on renal dialysis: Secondary | ICD-10-CM

## 2015-08-27 DIAGNOSIS — N186 End stage renal disease: Secondary | ICD-10-CM

## 2015-08-27 DIAGNOSIS — C9002 Multiple myeloma in relapse: Secondary | ICD-10-CM

## 2015-08-27 DIAGNOSIS — M898X9 Other specified disorders of bone, unspecified site: Secondary | ICD-10-CM

## 2015-08-27 DIAGNOSIS — C9000 Multiple myeloma not having achieved remission: Secondary | ICD-10-CM

## 2015-08-27 DIAGNOSIS — G893 Neoplasm related pain (acute) (chronic): Principal | ICD-10-CM

## 2015-08-27 DIAGNOSIS — C7951 Secondary malignant neoplasm of bone: Secondary | ICD-10-CM

## 2015-08-27 DIAGNOSIS — Z5112 Encounter for antineoplastic immunotherapy: Secondary | ICD-10-CM

## 2015-08-27 DIAGNOSIS — C3491 Malignant neoplasm of unspecified part of right bronchus or lung: Secondary | ICD-10-CM | POA: Insufficient documentation

## 2015-08-27 MED ORDER — SODIUM CHLORIDE 0.9 % IV SOLN
Freq: Once | INTRAVENOUS | Status: AC
  Administered 2015-08-27: 16:00:00 via INTRAVENOUS

## 2015-08-27 MED ORDER — SODIUM CHLORIDE 0.9 % IV SOLN
10.0000 mg | Freq: Once | INTRAVENOUS | Status: AC
  Administered 2015-08-27: 10 mg via INTRAVENOUS
  Filled 2015-08-27: qty 1

## 2015-08-27 MED ORDER — CARFILZOMIB CHEMO INJECTION 60 MG
27.0000 mg/m2 | Freq: Once | INTRAVENOUS | Status: AC
  Administered 2015-08-27: 46 mg via INTRAVENOUS
  Filled 2015-08-27: qty 23

## 2015-08-27 MED ORDER — PROCHLORPERAZINE MALEATE 10 MG PO TABS
10.0000 mg | ORAL_TABLET | Freq: Once | ORAL | Status: AC
  Administered 2015-08-27: 10 mg via ORAL

## 2015-08-27 MED ORDER — PROCHLORPERAZINE MALEATE 10 MG PO TABS
ORAL_TABLET | ORAL | Status: AC
  Filled 2015-08-27: qty 1

## 2015-08-27 NOTE — Progress Notes (Signed)
POST OPERATIVE OFFICE NOTE    CC:  F/u for surgery  HPI:  This is a 40 y.o. male who is s/p Left brachial cephalic fistula creation.  He is on HD via a tunneled catheter T-TH-SAT.    Allergies  Allergen Reactions  . Ciprofloxacin Rash  . Mobic [Meloxicam] Rash    Current Outpatient Prescriptions  Medication Sig Dispense Refill  . acyclovir (ZOVIRAX) 200 MG capsule Take 2 capsules (400 mg total) by mouth daily. 60 capsule 6  . Amino Acids-Protein Hydrolys (FEEDING SUPPLEMENT, PRO-STAT SUGAR FREE 64,) LIQD Take 30 mLs by mouth 2 (two) times daily.    . Cholecalciferol (VITAMIN D PO) Take 500 Units by mouth daily.    Marland Kitchen dexamethasone (DECADRON) 4 MG tablet Take 5 tablets ('20mg'$ ) on Saturday and Sunday every week (tome [place]). 60 tablet 4  . ferrous sulfate 325 (65 FE) MG tablet Take 1 tablet (325 mg total) by mouth 2 (two) times daily with a meal. 30 tablet 3  . hydrALAZINE (APRESOLINE) 25 MG tablet Take 1 tablet (25 mg total) by mouth 3 (three) times daily. 90 tablet 0  . LORazepam (ATIVAN) 0.5 MG tablet Take 1 tablet (0.5 mg total) by mouth at bedtime as needed for anxiety. 30 tablet 0  . metoprolol (LOPRESSOR) 50 MG tablet Take 1 tablet (50 mg total) by mouth 2 (two) times daily. 60 tablet 0  . mirtazapine (REMERON) 15 MG tablet Take 1 tablet (15 mg total) by mouth at bedtime. 30 tablet 0  . morphine (MS CONTIN) 15 MG 12 hr tablet Take 1 tablet (15 mg total) by mouth every 12 (twelve) hours. 60 tablet 0  . multivitamin (RENA-VIT) TABS tablet Take 1 tablet by mouth at bedtime. 30 tablet 0  . Nutritional Supplements (FEEDING SUPPLEMENT, NEPRO CARB STEADY,) LIQD Take 237 mLs by mouth 2 (two) times daily between meals.    . prochlorperazine (COMPAZINE) 10 MG tablet Take 1 tablet (10 mg total) by mouth every 6 (six) hours as needed for nausea or vomiting. 60 tablet 6  . senna-docusate (SENOKOT-S) 8.6-50 MG tablet Take 2 tablets by mouth 2 (two)  times daily. 120 tablet 0  . traMADol (ULTRAM) 50 MG tablet Take 1 tablet (50 mg total) by mouth every 6 (six) hours as needed. (Patient taking differently: Take 50 mg by mouth every 6 (six) hours as needed for moderate pain. ) 60 tablet 5  . calcium acetate (PHOSLO) 667 MG capsule Take 2 capsules (1,334 mg total) by mouth 3 (three) times daily with meals. (Patient not taking: Reported on 08/19/2015) 120 capsule 0   No current facility-administered medications for this visit.      ROS:  See HPI  Physical Exam:  Vitals:   08/27/15 1404  BP: 123/82  Pulse: (!) 116  Resp: 18  Temp: 99 F (37.2 C)    Incision:  Well healed Extremities:  N/V/M intact no muscle waisting in the extremity.  The fistula is not very large at this stage.  It feels about 4 mm in diameter to palpation.  Palpable thrill.   Assessment/Plan:  This is a 40 y.o. male who is 4 weeks s/p: Left  brachial cephalic fistula creation.  We advised him to exercise his arm daily with bicep curls motions and grip tightening.  He will f/u up in 5 weeks for a fistula duplex.     Theda Sers, EMMA [facility] PA-C [facility]   Clinic  MD:  Pt seen and examined with Dr. Netty Starring currently developing. It is not usable currently. Hopefully it'll help which heard in the next couple of months. We will duplex him to check diameter the fistula at his next office visit. Patient was instructed how to exercise the fistula. No evidence of steal.  Ruta Hinds, MD [facility] [place] Office: [phone] Pager: [phone]

## 2015-08-27 NOTE — Progress Notes (Signed)
Mr. Danny Lawless is here for a one month follow up for multiple myeloma and post radiation of the lung.  Weight changes, if any:  Wt Readings from Last 3 Encounters:  08/27/15 128 lb 12.8 oz (58.4 kg)  08/27/15 127 lb 8 oz (57.8 kg)  08/26/15 132 lb 8 oz (60.1 kg)   Respiratory complaints, if any: Denies SOB,coughing or wheezing Hemoptysis, if any:  None Swallowing Problems/Pain/Difficulty swallowing:None Decadron 4 mg  5 tablets on Saturday and Sunday no problems in his mouth Smoking Tobacco/Marijuana/Snuff/ETOH use: Never a smoker denies ETOH use or drug use Skin:Normal color to chest Pain : None Appetite:Good Fatigue:Has fatigue most of the day When is next chemo scheduled?:None Lab work from of chart:08-26-15 CBC w diff,CMET, 08-06-15 CXR  Dialysis Tuesday,Thursday and Saturday BP 112/74   Pulse (!) 112   Temp 98.5 F (36.9 C) (Oral)   Resp 16   Ht 5' 5"  (1.651 m)   Wt 128 lb 12.8 oz (58.4 kg)   SpO2 99%   BMI 21.43 kg/m

## 2015-08-27 NOTE — Patient Instructions (Signed)
[facility] Discharge Instructions for Patients Receiving Chemotherapy  Today you received the following chemotherapy agents Kyprolis  To help prevent nausea and vomiting after your treatment, we encourage you to take your nausea medication    If you develop nausea and vomiting that is not controlled by your nausea medication, call the clinic.   BELOW ARE SYMPTOMS THAT SHOULD BE REPORTED IMMEDIATELY:  *FEVER GREATER THAN 100.5 F  *CHILLS WITH OR WITHOUT FEVER  NAUSEA AND VOMITING THAT IS NOT CONTROLLED WITH YOUR NAUSEA MEDICATION  *UNUSUAL SHORTNESS OF BREATH  *UNUSUAL BRUISING OR BLEEDING  TENDERNESS IN MOUTH AND THROAT WITH OR WITHOUT PRESENCE OF ULCERS  *URINARY PROBLEMS  *BOWEL PROBLEMS  UNUSUAL RASH Items with * indicate a potential emergency and should be followed up as soon as possible.  Feel free to call the clinic you have any questions or concerns. The clinic phone number is [phone].  Please show the CHEMO ALERT CARD at check-in to the Emergency Department and triage nurse.   

## 2015-08-27 NOTE — Telephone Encounter (Signed)
Called & spoke to pt's wife about 9 am appt for today for chemo.  She reports that pt is in dialysis till @ 11 am.  She wants to know if pt can be r/s to after 2:30 Rad onc appt.  Charge nurse will look at schedule.

## 2015-08-27 NOTE — Progress Notes (Signed)
Radiation Oncology         [phone]) [phone] ________________________________  Name: Charles [name] MRN: [id number]  Date: 08/27/2015  DOB: 1975-08-11  Follow-Up Visit Note  CC: [name], MD  Curt Bears, MD  Diagnosis:  Multiple myeloma not having achieved remission with disease in the thoracic spine and right lung.  Interval Since Last Radiation:  4 weeks  07/02/15-07/27/15: 25 Gy in 10 fractions to the T3 and T11 vertebral bodies and Rt. Lung  Narrative:  The patient returns today for routine follow-up.  He tolerated radiotherapy well and has delivered. Episodes of pain. He takes morphine for this. He continues to follow up with Dr. Jana Hakim, and is currently receiving denosumab every 12 weeks, with the next dose due in October 2017.  On review of systems, the patient denies shortness of breath, coughing, wheezing, or hemoptysis. He denies difficulty swallowing. He denies back pain since completing radiation. He mentions he is urinating a small amount. He reports that his appetite is good but has fatigue most of the day. He is still receiving dialysis Tuesday, Thursday, and Saturday and mentions he is exhausted after this. No other complaints are noted.   ALLERGIES:  is allergic to ciprofloxacin and mobic [meloxicam].  Meds: Current Outpatient Prescriptions  Medication Sig Dispense Refill  . acyclovir (ZOVIRAX) 200 MG capsule Take 2 capsules (400 mg total) by mouth daily. 60 capsule 6  . Amino Acids-Protein Hydrolys (FEEDING SUPPLEMENT, PRO-STAT SUGAR FREE 64,) LIQD Take 30 mLs by mouth 2 (two) times daily.    . calcium acetate (PHOSLO) 667 MG capsule Take 2 capsules (1,334 mg total) by mouth 3 (three) times daily with meals. 120 capsule 0  . Cholecalciferol (VITAMIN D PO) Take 500 Units by mouth daily.    Marland Kitchen dexamethasone (DECADRON) 4 MG tablet Take 5 tablets (34m) on Saturday and Sunday every week (tome [place]. 60 tablet 4   . ferrous sulfate 325 (65 FE) MG tablet Take 1 tablet (325 mg total) by mouth 2 (two) times daily with a meal. 30 tablet 3  . hydrALAZINE (APRESOLINE) 25 MG tablet Take 1 tablet (25 mg total) by mouth 3 (three) times daily. 90 tablet 0  . LORazepam (ATIVAN) 0.5 MG tablet Take 1 tablet (0.5 mg total) by mouth at bedtime as needed for anxiety. 30 tablet 0  . metoprolol (LOPRESSOR) 50 MG tablet Take 1 tablet (50 mg total) by mouth 2 (two) times daily. 60 tablet 0  . mirtazapine (REMERON) 15 MG tablet Take 1 tablet (15 mg total) by mouth at bedtime. 30 tablet 0  . morphine (MS CONTIN) 15 MG 12 hr tablet Take 1 tablet (15 mg total) by mouth every 12 (twelve) hours. 60 tablet 0  . multivitamin (RENA-VIT) TABS tablet Take 1 tablet by mouth at bedtime. 30 tablet 0  . Nutritional Supplements (FEEDING SUPPLEMENT, NEPRO CARB STEADY,) LIQD Take 237 mLs by mouth 2 (two) times daily between meals.    . prochlorperazine (COMPAZINE) 10 MG tablet Take 1 tablet (10 mg total) by mouth every 6 (six) hours as needed for nausea or vomiting. 60 tablet 6  . senna-docusate (SENOKOT-S) 8.6-50 MG tablet Take 2 tablets by mouth 2 (two) times daily. 120 tablet 0  . traMADol (ULTRAM) 50 MG tablet Take 1 tablet (50 mg total) by mouth every 6 (six) hours as needed. (Patient not taking: Reported on 08/27/2015) 60 tablet 5   No current facility-administered medications for this encounter.  Physical Findings:  height is _0  (1.651 m) and weight is 128 lb 12.8 oz (58.4 kg). His oral temperature is 98.5 F (36.9 C). His blood pressure is 112/74 and his pulse is 112 (abnormal). His respiration is 16 and oxygen saturation is 99%.  In general this is a well appearing male in no acute distress. He's alert and oriented x4 and appropriate throughout the examination. Cardiopulmonary assessment is negative for acute distress and he exhibits normal effort. He presents in a wheelchair today.   Lab Findings: Lab Results  Component  Value Date   WBC 12.8 (H) 08/26/2015   HGB 9.7 (L) 08/26/2015   HCT 30.8 (L) 08/26/2015   MCV 95.7 08/26/2015   PLT 325 08/26/2015     Radiographic Findings: Dg Chest 2 View  Result Date: 08/06/2015 CLINICAL DATA:  Fever. Chemotherapy treatment for multiple myeloma. Chronic kidney disease. EXAM: CHEST  2 VIEW COMPARISON:  07/07/2015 FINDINGS: Right internal jugular central line has its tips in the right atrium. The heart is enlarged. There is venous hypertension with interstitial edema. There are bilateral pleural effusions with atelectasis in the dependent lungs. Basilar pneumonia not excluded. IMPRESSION: Dialysis catheter unchanged with the tip in the right atrium. Venous hypertension and interstitial edema. Bilateral dependent effusions with basilar atelectasis. Basilar pneumonia not excluded. Electronically Signed   By: Nelson Chimes M.D.   On: 08/06/2015 18:58    Impression/Plan: 1. Multiple myeloma not having achieved remission with disease in the thoracic spine and right lung. The patient has done well with therapy and is asymptomatic currently. He will continue his care with Dr. Jana Hakim and we are happy to see him back on an as needed basis.     Carola Rhine, PAC    This document serves as a record of services personally performed by Shona Simpson, PAC. It was created on her behalf by Lendon Collar, a trained medical scribe. The creation of this record is based on the scribe's personal observations and the provider's statements to them. This document has been checked and approved by the attending provider.

## 2015-08-28 ENCOUNTER — Other Ambulatory Visit: Payer: Self-pay | Admitting: *Deleted

## 2015-08-28 DIAGNOSIS — N186 End stage renal disease: Secondary | ICD-10-CM

## 2015-08-28 MED FILL — MORPHINE SULF ER 15 MG TAB: 15 | 30 days supply | Qty: 60 | Fill #0

## 2015-08-28 MED FILL — LORazepam 0.5 MG TABS: 0.5 | 30 days supply | Qty: 30 | Fill #0

## 2015-09-02 ENCOUNTER — Other Ambulatory Visit: Payer: Self-pay

## 2015-09-02 ENCOUNTER — Ambulatory Visit: Payer: Self-pay

## 2015-09-03 ENCOUNTER — Ambulatory Visit: Payer: Self-pay

## 2015-09-07 ENCOUNTER — Ambulatory Visit (HOSPITAL_COMMUNITY)
Admission: EM | Admit: 2015-09-07 | Discharge: 2015-09-07 | Disposition: A | Discharge status: Home / Self Care | Payer: Self-pay | Attending: Emergency Medicine | Admitting: Emergency Medicine

## 2015-09-07 ENCOUNTER — Encounter (HOSPITAL_COMMUNITY): Payer: Self-pay | Admitting: Emergency Medicine

## 2015-09-07 DIAGNOSIS — N2889 Other specified disorders of kidney and ureter: Secondary | ICD-10-CM

## 2015-09-07 DIAGNOSIS — I151 Hypertension secondary to other renal disorders: Secondary | ICD-10-CM

## 2015-09-07 DIAGNOSIS — Z760 Encounter for issue of repeat prescription: Secondary | ICD-10-CM

## 2015-09-07 LAB — POCT I-STAT, CHEM 8
BUN: 49 mg/dL — AB (ref 6–20)
CREATININE: 6.4 mg/dL — AB (ref 0.61–1.24)
Calcium, Ion: 1.14 mmol/L (ref 1.13–1.30)
Chloride: 95 mmol/L — ABNORMAL LOW (ref 101–111)
GLUCOSE: 200 mg/dL — AB (ref 65–99)
HEMATOCRIT: 26 % — AB (ref 39.0–52.0)
HEMOGLOBIN: 8.8 g/dL — AB (ref 13.0–17.0)
POTASSIUM: 4.7 mmol/L (ref 3.5–5.1)
Sodium: 131 mmol/L — ABNORMAL LOW (ref 135–145)
TCO2: 25 mmol/L (ref 0–100)

## 2015-09-07 MED ORDER — METOPROLOL TARTRATE 50 MG PO TABS: 50.0000 mg | ORAL_TABLET | Freq: Two times a day (BID) | ORAL | 0 refills | Status: DC

## 2015-09-07 MED ORDER — HYDRALAZINE HCL 25 MG PO TABS: 25.0000 mg | ORAL_TABLET | Freq: Three times a day (TID) | ORAL | 0 refills | Status: DC

## 2015-09-07 MED FILL — hydrALAZINE HCL 25 MG TABS: 25 | 30 days supply | Qty: 90 | Fill #0

## 2015-09-07 MED FILL — METOPROLOL TARTRATE 50 MG T: 50 | 30 days supply | Qty: 60 | Fill #0

## 2015-09-07 NOTE — ED Provider Notes (Signed)
[facility]    CSN: [id number] Arrival date & time: 09/07/15  1038  First Provider Contact:  First MD Initiated Contact with Patient 09/07/15 1131        History   Chief Complaint Chief Complaint  Patient presents with  . Medication Refill    HPI Charles [name] is a 40 y.o. male.   He is a 40 year old man here for medication refill. He is accompanied by his daughters who act as interpreters.  He states he is a refill on his blood pressure medication. This was started about 2 months ago. He has been out of his medicine for about a week. Over the last several days, he has been feeling poorly with low energy and fatigue. He denies any chest pain or shortness of breath. He has followed by oncology for multiple myeloma. He is also on dialysis. Last dialysis was Saturday.    Past Medical History:  Diagnosis Date  . ESRD (end stage renal disease) on dialysis ([place])    [address] [facility] (08/06/2015)  . GERD (gastroesophageal reflux disease)   . Headache    "monthly" (08/06/2015)  . History of blood transfusion 07/2015   "related to his kidneys"  . Hypertension   . 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 Kidney stones   . Lytic bone lesions on xray   . Neuromuscular disorder ([place])    states both arms are shaking a lot    Patient Active Problem List   Diagnosis Date Noted  . Anemia 08/19/2015  . Fever 08/06/2015  . Hemodialysis patient ([place]) 07/29/2015  . Back pain   . Multiple myeloma not having achieved remission ([place])   . FUO (fever of unknown origin)   . PRES (posterior reversible encephalopathy syndrome)   . HTN (hypertension), malignant 06/26/2015  . Blurry vision, bilateral   . Pain from bone metastases ([place]) 02/19/2015  . Rash 01/21/2015  . Cancer associated pain 01/21/2015  . Nausea with vomiting 01/21/2015  . Hypoalbuminemia due to protein-calorie malnutrition ([place]) 12/31/2014  . Prostatitis 12/28/2014  . Hordeolum externum of right eye 12/18/2014  . ESRD (end  stage renal disease) on dialysis ([place]) 02/08/2014  . Lytic bone lesions on xray 01/30/2014    Past Surgical History:  Procedure Laterality Date  . AV FISTULA PLACEMENT Left 07/13/2015   Procedure: CREATION OF LEFT UPPER ARM  ARTERIOVENOUS (AV) FISTULA ;  Surgeon: Elam Dutch, MD;  Location: [facility];  Service: Vascular;  Laterality: Left;  . INSERTION OF DIALYSIS CATHETER Right ~ 07/2015   chest       Home Medications    Prior to Admission medications   Medication Sig Start Date End Date Taking? Authorizing Provider  acyclovir (ZOVIRAX) 200 MG capsule Take 2 capsules (400 mg total) by mouth daily. 08/19/15   Maryanna Shape, NP  Amino Acids-Protein Hydrolys (FEEDING SUPPLEMENT, PRO-STAT SUGAR FREE 64,) LIQD Take 30 mLs by mouth 2 (two) times daily. 07/10/15   Modena Jansky, MD  calcium acetate (PHOSLO) 667 MG capsule Take 2 capsules (1,334 mg total) by mouth 3 (three) times daily with meals. 07/10/15   Modena Jansky, MD  Cholecalciferol (VITAMIN D PO) Take 500 Units by mouth daily.    Historical Provider, MD  dexamethasone (DECADRON) 4 MG tablet Take 5 tablets (37m) on Saturday and Sunday every week (tome [place]. 07/11/15   GChauncey Cruel MD  ferrous sulfate 325 (65 FE) MG  tablet Take 1 tablet (325 mg total) by mouth 2 (two) times daily with a meal. 08/10/15   Hosie Poisson, MD  hydrALAZINE (APRESOLINE) 25 MG tablet Take 1 tablet (25 mg total) by mouth 3 (three) times daily. 09/07/15   Melony Overly, MD  LORazepam (ATIVAN) 0.5 MG tablet Take 1 tablet (0.5 mg total) by mouth at bedtime as needed for anxiety. 08/26/15   [name], MD  metoprolol (LOPRESSOR) 50 MG tablet Take 1 tablet (50 mg total) by mouth 2 (two) times daily. 09/07/15   Melony Overly, MD  mirtazapine (REMERON) 15 MG tablet Take 1 tablet (15 mg total) by mouth at bedtime. 07/10/15   Modena Jansky, MD  morphine (MS CONTIN) 15 MG 12 hr tablet Take 1 tablet (15 mg total) by  mouth every 12 (twelve) hours. 08/26/15   [name], MD  multivitamin (RENA-VIT) TABS tablet Take 1 tablet by mouth at bedtime. 07/10/15   Modena Jansky, MD  Nutritional Supplements (FEEDING SUPPLEMENT, NEPRO CARB STEADY,) LIQD Take 237 mLs by mouth 2 (two) times daily between meals. 07/10/15   Modena Jansky, MD  prochlorperazine (COMPAZINE) 10 MG tablet Take 1 tablet (10 mg total) by mouth every 6 (six) hours as needed for nausea or vomiting. 06/11/15   [name], MD  senna-docusate (SENOKOT-S) 8.6-50 MG tablet Take 2 tablets by mouth 2 (two) times daily. 07/10/15   Modena Jansky, MD  traMADol (ULTRAM) 50 MG tablet Take 1 tablet (50 mg total) by mouth every 6 (six) hours as needed. Patient not taking: Reported on 08/27/2015 06/11/15   [name], MD    Family History Family History  Problem Relation Age of Onset  . Diabetes Father     Social History Social History  Substance Use Topics  . Smoking status: Never Smoker  . Smokeless tobacco: Never Used  . Alcohol use No     Allergies   Ciprofloxacin and Mobic [meloxicam]   Review of Systems Review of Systems  Constitutional: Positive for fatigue. Negative for fever.  Respiratory: Negative for shortness of breath.   Cardiovascular: Negative for chest pain.     Physical Exam Triage Vital Signs ED Triage Vitals  Enc Vitals Group     BP 09/07/15 1125 111/78     Pulse Rate 09/07/15 1125 99     Resp 09/07/15 1125 12     Temp 09/07/15 1125 97.3 F (36.3 C)     Temp Source 09/07/15 1125 Oral     SpO2 09/07/15 1125 100 %     Weight --      Height --      Head Circumference --      Peak Flow --      Pain Score 09/07/15 1133 0     Pain Loc --      Pain Edu? --      Excl. in [place]? --    No data found.   Updated Vital Signs BP 111/78 (BP Location: Right Arm)   Pulse 99   Temp 97.3 F (36.3 C) (Oral)   Resp 12   SpO2 100%   Visual Acuity Right Eye Distance:   Left Eye Distance:   Bilateral  Distance:    Right Eye Near:   Left Eye Near:    Bilateral Near:     Physical Exam  Constitutional: He is oriented to person, place, and time. No distress.  Patient appears fatigued and pale  Cardiovascular: Normal  rate, regular rhythm and normal heart sounds.   No murmur heard. Pulmonary/Chest: Effort normal and breath sounds normal. He has no wheezes. He has no rales.  Neurological: He is alert and oriented to person, place, and time.     UC Treatments / Results  Labs (all labs ordered are listed, but only abnormal results are displayed) Labs Reviewed  POCT I-STAT, CHEM 8 - Abnormal; Notable for the following:       Result Value   Sodium 131 (*)    Chloride 95 (*)    BUN 49 (*)    Creatinine, Ser 6.40 (*)    Glucose, Bld 200 (*)    Hemoglobin 8.8 (*)    HCT 26.0 (*)    All other components within normal limits    EKG  EKG Interpretation None       Radiology No results found.  Procedures Procedures (including critical care time)  Medications Ordered in UC Medications - No data to display   Initial Impression / Assessment and Plan / UC Course  I have reviewed the triage vital signs and the nursing notes.  Pertinent labs & imaging results that were available during my care of the patient were reviewed by me and considered in my medical decision making (see chart for details).  Clinical Course    I provided a 30 day supply of both his hydralazine to his metoprolol. Discussed just restarting hydralazine for the time being given his blood pressure is really not elevated today. I-STAT reviewed. He does have some hyponatremia, but is due for dialysis tomorrow. Hemoglobin is low, but stable in the 8-9 range.  His blood sugar is elevated at 200, but he is on steroids for his multiple myeloma. He does have follow-up with his oncologist later this week.  Final Clinical Impressions(s) / UC Diagnoses   Final diagnoses:  Hypertension secondary to other renal  disorders  Medication refill    New Prescriptions Discharge Medication List as of 09/07/2015 12:39 PM       Melony Overly, MD 09/07/15 1255

## 2015-09-07 NOTE — ED Triage Notes (Signed)
The patient presented to the [facility] with a complaint of needing a BP check and refill on his metoprolol and hydralazine that he has been out of for 1 week.

## 2015-09-07 NOTE — Discharge Instructions (Signed)
I have refilled his blood pressure medicines. Just restart the hydralazine for now. If his blood pressure is elevated at dialysis this week, you can restart the metoprolol next week.  Follow-up with his oncologist as scheduled.

## 2015-09-09 ENCOUNTER — Other Ambulatory Visit: Payer: Self-pay | Admitting: *Deleted

## 2015-09-09 ENCOUNTER — Telehealth: Payer: Self-pay | Admitting: *Deleted

## 2015-09-09 ENCOUNTER — Other Ambulatory Visit: Payer: Self-pay | Admitting: Oncology

## 2015-09-09 ENCOUNTER — Other Ambulatory Visit (HOSPITAL_BASED_OUTPATIENT_CLINIC_OR_DEPARTMENT_OTHER): Payer: Self-pay

## 2015-09-09 ENCOUNTER — Other Ambulatory Visit: Payer: Self-pay

## 2015-09-09 ENCOUNTER — Ambulatory Visit: Payer: Self-pay | Admitting: Oncology

## 2015-09-09 ENCOUNTER — Ambulatory Visit (HOSPITAL_BASED_OUTPATIENT_CLINIC_OR_DEPARTMENT_OTHER): Payer: Self-pay

## 2015-09-09 VITALS — BP 128/78 | HR 111 | Temp 97.9°F | Resp 16

## 2015-09-09 DIAGNOSIS — C7951 Secondary malignant neoplasm of bone: Secondary | ICD-10-CM

## 2015-09-09 DIAGNOSIS — M899 Disorder of bone, unspecified: Secondary | ICD-10-CM

## 2015-09-09 DIAGNOSIS — Z5112 Encounter for antineoplastic immunotherapy: Secondary | ICD-10-CM

## 2015-09-09 DIAGNOSIS — N179 Acute kidney failure, unspecified: Secondary | ICD-10-CM

## 2015-09-09 DIAGNOSIS — G893 Neoplasm related pain (acute) (chronic): Principal | ICD-10-CM

## 2015-09-09 DIAGNOSIS — C9000 Multiple myeloma not having achieved remission: Secondary | ICD-10-CM

## 2015-09-09 DIAGNOSIS — C9002 Multiple myeloma in relapse: Secondary | ICD-10-CM

## 2015-09-09 LAB — COMPREHENSIVE METABOLIC PANEL
ALT: 17 U/L (ref 0–55)
ANION GAP: 10 meq/L (ref 3–11)
AST: 32 U/L (ref 5–34)
Albumin: 1.5 g/dL — ABNORMAL LOW (ref 3.5–5.0)
Alkaline Phosphatase: 275 U/L — ABNORMAL HIGH (ref 40–150)
BUN: 39.5 mg/dL — ABNORMAL HIGH (ref 7.0–26.0)
CALCIUM: 10.5 mg/dL — AB (ref 8.4–10.4)
CHLORIDE: 97 meq/L — AB (ref 98–109)
CO2: 25 mEq/L (ref 22–29)
CREATININE: 4.2 mg/dL — AB (ref 0.7–1.3)
EGFR: 16 mL/min/{1.73_m2} — AB (ref >90)
Glucose: 81 mg/dl (ref 70–140)
POTASSIUM: 4.4 meq/L (ref 3.5–5.1)
Sodium: 132 mEq/L — ABNORMAL LOW (ref 136–145)
Total Bilirubin: 0.34 mg/dL (ref 0.20–1.20)
Total Protein: 6.9 g/dL (ref 6.4–8.3)

## 2015-09-09 LAB — CBC WITH DIFFERENTIAL/PLATELET
BASO%: 0.3 % (ref 0.0–2.0)
BASOS ABS: 0.1 10*3/uL (ref 0.0–0.1)
EOS%: 0.3 % (ref 0.0–7.0)
Eosinophils Absolute: 0.1 10*3/uL (ref 0.0–0.5)
HEMATOCRIT: 24 % — AB (ref 38.4–49.9)
HGB: 7.6 g/dL — ABNORMAL LOW (ref 13.0–17.1)
LYMPH#: 1.4 10*3/uL (ref 0.9–3.3)
LYMPH%: 7.1 % — AB (ref 14.0–49.0)
MCH: 29.6 pg (ref 27.2–33.4)
MCHC: 31.7 g/dL — AB (ref 32.0–36.0)
MCV: 93.3 fL (ref 79.3–98.0)
MONO#: 2.9 10*3/uL — AB (ref 0.1–0.9)
MONO%: 15.1 % — ABNORMAL HIGH (ref 0.0–14.0)
NEUT#: 14.7 10*3/uL — ABNORMAL HIGH (ref 1.5–6.5)
NEUT%: 77.2 % — AB (ref 39.0–75.0)
PLATELETS: 419 10*3/uL — AB (ref 140–400)
RBC: 2.57 10*6/uL — ABNORMAL LOW (ref 4.20–5.82)
RDW: 18.9 % — ABNORMAL HIGH (ref 11.0–14.6)
WBC: 19.1 10*3/uL — ABNORMAL HIGH (ref 4.0–10.3)

## 2015-09-09 LAB — TECHNOLOGIST REVIEW

## 2015-09-09 MED ORDER — SODIUM CHLORIDE 0.9 % IV SOLN
10.0000 mg | Freq: Once | INTRAVENOUS | Status: AC
  Administered 2015-09-09: 10 mg via INTRAVENOUS
  Filled 2015-09-09: qty 1

## 2015-09-09 MED ORDER — DEXTROSE 5 % IV SOLN
27.0000 mg/m2 | Freq: Once | INTRAVENOUS | Status: AC
  Administered 2015-09-09: 46 mg via INTRAVENOUS
  Filled 2015-09-09: qty 23

## 2015-09-09 MED ORDER — PROCHLORPERAZINE MALEATE 10 MG PO TABS
ORAL_TABLET | ORAL | Status: AC
  Filled 2015-09-09: qty 1

## 2015-09-09 MED ORDER — SODIUM CHLORIDE 0.9 % IV SOLN
INTRAVENOUS | Status: DC
  Administered 2015-09-09: 14:00:00 via INTRAVENOUS

## 2015-09-09 MED ORDER — PROCHLORPERAZINE MALEATE 10 MG PO TABS
10.0000 mg | ORAL_TABLET | Freq: Once | ORAL | Status: AC
  Administered 2015-09-09: 10 mg via ORAL

## 2015-09-09 NOTE — Telephone Encounter (Signed)
Per MD review of labs - noted and ok to proceed with therapy. Pt is to be typed and cross at visit next week for transfusion.  Orders and request sent per In [facility]

## 2015-09-09 NOTE — Progress Notes (Signed)
Okay to treat today with today's lab results and heart rate of 111, per Val, RN, per Dr. Jana Hakim.

## 2015-09-09 NOTE — Patient Instructions (Signed)
[facility] Discharge Instructions for Patients Receiving Chemotherapy  Today you received the following chemotherapy agents Kyprolis  To help prevent nausea and vomiting after your treatment, we encourage you to take your nausea medication    If you develop nausea and vomiting that is not controlled by your nausea medication, call the clinic.   BELOW ARE SYMPTOMS THAT SHOULD BE REPORTED IMMEDIATELY:  *FEVER GREATER THAN 100.5 F  *CHILLS WITH OR WITHOUT FEVER  NAUSEA AND VOMITING THAT IS NOT CONTROLLED WITH YOUR NAUSEA MEDICATION  *UNUSUAL SHORTNESS OF BREATH  *UNUSUAL BRUISING OR BLEEDING  TENDERNESS IN MOUTH AND THROAT WITH OR WITHOUT PRESENCE OF ULCERS  *URINARY PROBLEMS  *BOWEL PROBLEMS  UNUSUAL RASH Items with * indicate a potential emergency and should be followed up as soon as possible.  Feel free to call the clinic you have any questions or concerns. The clinic phone number is [phone].  Please show the CHEMO ALERT CARD at check-in to the Emergency Department and triage nurse.   

## 2015-09-10 ENCOUNTER — Other Ambulatory Visit (HOSPITAL_BASED_OUTPATIENT_CLINIC_OR_DEPARTMENT_OTHER): Payer: Self-pay

## 2015-09-10 ENCOUNTER — Other Ambulatory Visit: Payer: Self-pay | Admitting: *Deleted

## 2015-09-10 ENCOUNTER — Inpatient Hospital Stay (HOSPITAL_COMMUNITY)
Admission: EM | Admit: 2015-09-10 | Discharge: 2015-09-23 | DRG: 840 | Disposition: A | Discharge status: Hospice | Payer: Self-pay | Attending: Internal Medicine | Admitting: Internal Medicine

## 2015-09-10 ENCOUNTER — Encounter (HOSPITAL_COMMUNITY): Payer: Self-pay

## 2015-09-10 ENCOUNTER — Emergency Department (HOSPITAL_COMMUNITY): Payer: Self-pay

## 2015-09-10 ENCOUNTER — Other Ambulatory Visit: Payer: Self-pay

## 2015-09-10 ENCOUNTER — Other Ambulatory Visit: Payer: Self-pay | Admitting: Oncology

## 2015-09-10 ENCOUNTER — Ambulatory Visit: Payer: Self-pay

## 2015-09-10 ENCOUNTER — Telehealth: Payer: Self-pay | Admitting: *Deleted

## 2015-09-10 ENCOUNTER — Telehealth: Payer: Self-pay | Admitting: Oncology

## 2015-09-10 DIAGNOSIS — N2581 Secondary hyperparathyroidism of renal origin: Secondary | ICD-10-CM | POA: Diagnosis present

## 2015-09-10 DIAGNOSIS — R000 Tachycardia, unspecified: Secondary | ICD-10-CM | POA: Diagnosis present

## 2015-09-10 DIAGNOSIS — E46 Unspecified protein-calorie malnutrition: Secondary | ICD-10-CM

## 2015-09-10 DIAGNOSIS — Z87442 Personal history of urinary calculi: Secondary | ICD-10-CM

## 2015-09-10 DIAGNOSIS — D631 Anemia in chronic kidney disease: Secondary | ICD-10-CM | POA: Diagnosis present

## 2015-09-10 DIAGNOSIS — A419 Sepsis, unspecified organism: Secondary | ICD-10-CM | POA: Diagnosis present

## 2015-09-10 DIAGNOSIS — M899 Disorder of bone, unspecified: Secondary | ICD-10-CM

## 2015-09-10 DIAGNOSIS — C9002 Multiple myeloma in relapse: Secondary | ICD-10-CM

## 2015-09-10 DIAGNOSIS — R6510 Systemic inflammatory response syndrome (SIRS) of non-infectious origin without acute organ dysfunction: Secondary | ICD-10-CM | POA: Diagnosis present

## 2015-09-10 DIAGNOSIS — Z515 Encounter for palliative care: Secondary | ICD-10-CM | POA: Diagnosis not present

## 2015-09-10 DIAGNOSIS — J90 Pleural effusion, not elsewhere classified: Secondary | ICD-10-CM | POA: Diagnosis not present

## 2015-09-10 DIAGNOSIS — E440 Moderate protein-calorie malnutrition: Secondary | ICD-10-CM

## 2015-09-10 DIAGNOSIS — I6783 Posterior reversible encephalopathy syndrome: Secondary | ICD-10-CM

## 2015-09-10 DIAGNOSIS — H538 Other visual disturbances: Secondary | ICD-10-CM

## 2015-09-10 DIAGNOSIS — Z992 Dependence on renal dialysis: Secondary | ICD-10-CM

## 2015-09-10 DIAGNOSIS — T451X5A Adverse effect of antineoplastic and immunosuppressive drugs, initial encounter: Secondary | ICD-10-CM

## 2015-09-10 DIAGNOSIS — G893 Neoplasm related pain (acute) (chronic): Secondary | ICD-10-CM

## 2015-09-10 DIAGNOSIS — C7800 Secondary malignant neoplasm of unspecified lung: Secondary | ICD-10-CM | POA: Diagnosis present

## 2015-09-10 DIAGNOSIS — K5900 Constipation, unspecified: Secondary | ICD-10-CM | POA: Diagnosis not present

## 2015-09-10 DIAGNOSIS — Z9221 Personal history of antineoplastic chemotherapy: Secondary | ICD-10-CM

## 2015-09-10 DIAGNOSIS — N179 Acute kidney failure, unspecified: Secondary | ICD-10-CM

## 2015-09-10 DIAGNOSIS — I10 Essential (primary) hypertension: Secondary | ICD-10-CM | POA: Diagnosis present

## 2015-09-10 DIAGNOSIS — I120 Hypertensive chronic kidney disease with stage 5 chronic kidney disease or end stage renal disease: Secondary | ICD-10-CM | POA: Diagnosis present

## 2015-09-10 DIAGNOSIS — G894 Chronic pain syndrome: Secondary | ICD-10-CM | POA: Diagnosis present

## 2015-09-10 DIAGNOSIS — D638 Anemia in other chronic diseases classified elsewhere: Secondary | ICD-10-CM | POA: Diagnosis present

## 2015-09-10 DIAGNOSIS — N189 Chronic kidney disease, unspecified: Secondary | ICD-10-CM

## 2015-09-10 DIAGNOSIS — Z66 Do not resuscitate: Secondary | ICD-10-CM | POA: Diagnosis present

## 2015-09-10 DIAGNOSIS — D630 Anemia in neoplastic disease: Secondary | ICD-10-CM | POA: Diagnosis present

## 2015-09-10 DIAGNOSIS — R4182 Altered mental status, unspecified: Secondary | ICD-10-CM

## 2015-09-10 DIAGNOSIS — C9000 Multiple myeloma not having achieved remission: Secondary | ICD-10-CM

## 2015-09-10 DIAGNOSIS — Z7952 Long term (current) use of systemic steroids: Secondary | ICD-10-CM

## 2015-09-10 DIAGNOSIS — T827XXA Infection and inflammatory reaction due to other cardiac and vascular devices, implants and grafts, initial encounter: Secondary | ICD-10-CM

## 2015-09-10 DIAGNOSIS — R5081 Fever presenting with conditions classified elsewhere: Secondary | ICD-10-CM | POA: Diagnosis present

## 2015-09-10 DIAGNOSIS — R229 Localized swelling, mass and lump, unspecified: Secondary | ICD-10-CM

## 2015-09-10 DIAGNOSIS — IMO0002 Reserved for concepts with insufficient information to code with codable children: Secondary | ICD-10-CM

## 2015-09-10 DIAGNOSIS — R609 Edema, unspecified: Secondary | ICD-10-CM

## 2015-09-10 DIAGNOSIS — D6481 Anemia due to antineoplastic chemotherapy: Secondary | ICD-10-CM

## 2015-09-10 DIAGNOSIS — R21 Rash and other nonspecific skin eruption: Secondary | ICD-10-CM

## 2015-09-10 DIAGNOSIS — E43 Unspecified severe protein-calorie malnutrition: Secondary | ICD-10-CM | POA: Diagnosis present

## 2015-09-10 DIAGNOSIS — N186 End stage renal disease: Secondary | ICD-10-CM | POA: Diagnosis present

## 2015-09-10 DIAGNOSIS — R509 Fever, unspecified: Secondary | ICD-10-CM | POA: Diagnosis present

## 2015-09-10 DIAGNOSIS — K219 Gastro-esophageal reflux disease without esophagitis: Secondary | ICD-10-CM | POA: Diagnosis present

## 2015-09-10 DIAGNOSIS — C7951 Secondary malignant neoplasm of bone: Secondary | ICD-10-CM

## 2015-09-10 DIAGNOSIS — E8809 Other disorders of plasma-protein metabolism, not elsewhere classified: Secondary | ICD-10-CM

## 2015-09-10 DIAGNOSIS — H00013 Hordeolum externum right eye, unspecified eyelid: Secondary | ICD-10-CM

## 2015-09-10 DIAGNOSIS — E8889 Other specified metabolic disorders: Secondary | ICD-10-CM | POA: Diagnosis present

## 2015-09-10 DIAGNOSIS — Z923 Personal history of irradiation: Secondary | ICD-10-CM

## 2015-09-10 DIAGNOSIS — D649 Anemia, unspecified: Secondary | ICD-10-CM | POA: Diagnosis present

## 2015-09-10 LAB — CBC WITH DIFFERENTIAL/PLATELET
BASO%: 0.2 % (ref 0.0–2.0)
BASOS ABS: 0 10*3/uL (ref 0.0–0.1)
Basophils Absolute: 0 10*3/uL (ref 0.0–0.1)
Basophils Relative: 0 %
EOS PCT: 1 %
EOS%: 0.2 % (ref 0.0–7.0)
Eosinophils Absolute: 0 10*3/uL (ref 0.0–0.5)
Eosinophils Absolute: 0.2 10*3/uL (ref 0.0–0.7)
HCT: 22.2 % — ABNORMAL LOW (ref 39.0–52.0)
HEMATOCRIT: 24.4 % — AB (ref 38.4–49.9)
HEMOGLOBIN: 7.6 g/dL — AB (ref 13.0–17.1)
Hemoglobin: 6.9 g/dL — CL (ref 13.0–17.0)
LYMPH#: 3.6 10*3/uL — AB (ref 0.9–3.3)
LYMPH%: 17.1 % (ref 14.0–49.0)
LYMPHS ABS: 3.9 10*3/uL (ref 0.7–4.0)
LYMPHS PCT: 21 %
MCH: 29.8 pg (ref 27.2–33.4)
MCH: 30.1 pg (ref 26.0–34.0)
MCHC: 31.1 g/dL — ABNORMAL LOW (ref 32.0–36.0)
MCHC: 31.1 g/dL (ref 30.0–36.0)
MCV: 95.7 fL (ref 79.3–98.0)
MCV: 96.9 fL (ref 78.0–100.0)
MONO ABS: 2.1 10*3/uL — AB (ref 0.1–1.0)
MONO#: 2.9 10*3/uL — ABNORMAL HIGH (ref 0.1–0.9)
MONO%: 13.9 % (ref 0.0–14.0)
Monocytes Relative: 11 %
NEUT#: 14.2 10*3/uL — ABNORMAL HIGH (ref 1.5–6.5)
NEUT%: 68.6 % (ref 39.0–75.0)
NEUTROS PCT: 67 %
Neutro Abs: 12.5 10*3/uL — ABNORMAL HIGH (ref 1.7–7.7)
Platelets: 357 10*3/uL (ref 140–400)
Platelets: 349 10*3/uL (ref 150–400)
RBC: 2.55 10*6/uL — ABNORMAL LOW (ref 4.20–5.82)
RBC: 2.29 MIL/uL — AB (ref 4.22–5.81)
RDW: 18.3 % — AB (ref 11.0–14.6)
RDW: 18.2 % — AB (ref 11.5–15.5)
WBC: 20.7 10*3/uL — AB (ref 4.0–10.3)
WBC: 18.7 10*3/uL — AB (ref 4.0–10.5)
nRBC: 1 /100 WBC — ABNORMAL HIGH

## 2015-09-10 LAB — COMPREHENSIVE METABOLIC PANEL
ALK PHOS: 429 U/L — AB (ref 40–150)
ALT: 23 U/L (ref 0–55)
ALT: 23 U/L (ref 17–63)
ANION GAP: 7 meq/L (ref 3–11)
ANION GAP: 9 (ref 5–15)
AST: 37 U/L — ABNORMAL HIGH (ref 5–34)
AST: 36 U/L (ref 15–41)
Albumin: 1.5 g/dL — ABNORMAL LOW (ref 3.5–5.0)
Albumin: 1.6 g/dL — ABNORMAL LOW (ref 3.5–5.0)
Alkaline Phosphatase: 322 U/L — ABNORMAL HIGH (ref 38–126)
BILIRUBIN TOTAL: 0.32 mg/dL (ref 0.20–1.20)
BUN: 11.4 mg/dL (ref 7.0–26.0)
BUN: 9 mg/dL (ref 6–20)
CALCIUM: 10.9 mg/dL — AB (ref 8.4–10.4)
CHLORIDE: 94 mmol/L — AB (ref 101–111)
CO2: 29 meq/L (ref 22–29)
CO2: 28 mmol/L (ref 22–32)
CREATININE: 1.9 mg/dL — AB (ref 0.7–1.3)
CREATININE: 2.21 mg/dL — AB (ref 0.61–1.24)
Calcium: 9.9 mg/dL (ref 8.9–10.3)
Chloride: 97 mEq/L — ABNORMAL LOW (ref 98–109)
EGFR: 43 mL/min/{1.73_m2} — AB (ref >90)
GFR, EST AFRICAN AMERICAN: 41 mL/min — AB (ref >60)
GFR, EST NON AFRICAN AMERICAN: 35 mL/min — AB (ref >60)
Glucose, Bld: 114 mg/dL — ABNORMAL HIGH (ref 65–99)
Glucose: 95 mg/dl (ref 70–140)
Potassium: 3.7 mEq/L (ref 3.5–5.1)
Potassium: 3.6 mmol/L (ref 3.5–5.1)
SODIUM: 131 mmol/L — AB (ref 135–145)
Sodium: 133 mEq/L — ABNORMAL LOW (ref 136–145)
TOTAL PROTEIN: 7.1 g/dL (ref 6.4–8.3)
Total Bilirubin: 0.3 mg/dL (ref 0.3–1.2)
Total Protein: 6.3 g/dL — ABNORMAL LOW (ref 6.5–8.1)

## 2015-09-10 LAB — PHOSPHORUS: Phosphorus: 2.2 mg/dL — ABNORMAL LOW (ref 2.5–4.6)

## 2015-09-10 LAB — BETA 2 MICROGLOBULIN, SERUM: Beta-2: 33.2 mg/L — ABNORMAL HIGH (ref 0.6–2.4)

## 2015-09-10 LAB — KAPPA/LAMBDA LIGHT CHAINS
Ig Kappa Free Light Chain: 247 mg/L — ABNORMAL HIGH (ref 3.3–19.4)
Ig Lambda Free Light Chain: 17.9 mg/L (ref 5.7–26.3)
KAPPA/LAMBDA FLC RATIO: 13.8 — AB (ref 0.26–1.65)

## 2015-09-10 LAB — TECHNOLOGIST REVIEW

## 2015-09-10 LAB — PROTIME-INR
INR: 1.23
PROTHROMBIN TIME: 15.6 s — AB (ref 11.4–15.2)

## 2015-09-10 LAB — POC OCCULT BLOOD, ED: Fecal Occult Bld: NEGATIVE

## 2015-09-10 LAB — I-STAT CG4 LACTIC ACID, ED: LACTIC ACID, VENOUS: 3.24 mmol/L — AB (ref 0.5–1.9)

## 2015-09-10 LAB — APTT: aPTT: 37 seconds — ABNORMAL HIGH (ref 24–36)

## 2015-09-10 MED ORDER — ONDANSETRON HCL 4 MG/2ML IJ SOLN: 4.0000 mg | Freq: Four times a day (QID) | INTRAMUSCULAR | Status: DC | PRN

## 2015-09-10 MED ORDER — ACETAMINOPHEN 325 MG PO TABS
650.0000 mg | ORAL_TABLET | Freq: Once | ORAL | Status: DC
  Filled 2015-09-10: qty 2

## 2015-09-10 MED ORDER — VANCOMYCIN HCL IN DEXTROSE 1-5 GM/200ML-% IV SOLN: 1000.0000 mg | Freq: Once | INTRAVENOUS | Status: DC

## 2015-09-10 MED ORDER — VANCOMYCIN HCL 500 MG IV SOLR
500.0000 mg | INTRAVENOUS | Status: DC
  Filled 2015-09-10: qty 500

## 2015-09-10 MED ORDER — ACETAMINOPHEN 325 MG PO TABS
650.0000 mg | ORAL_TABLET | Freq: Once | ORAL | Status: AC
  Administered 2015-09-10: 650 mg via ORAL
  Filled 2015-09-10: qty 2

## 2015-09-10 MED ORDER — SODIUM CHLORIDE 0.9 % IV BOLUS (SEPSIS)
1000.0000 mL | Freq: Once | INTRAVENOUS | Status: AC
  Administered 2015-09-10: 1000 mL via INTRAVENOUS

## 2015-09-10 MED ORDER — PIPERACILLIN-TAZOBACTAM 3.375 G IVPB: 3.3750 g | Freq: Three times a day (TID) | INTRAVENOUS | Status: DC

## 2015-09-10 MED ORDER — PIPERACILLIN-TAZOBACTAM 3.375 G IVPB 30 MIN
3.3750 g | Freq: Once | INTRAVENOUS | Status: AC
  Administered 2015-09-10: 3.375 g via INTRAVENOUS
  Filled 2015-09-10: qty 50

## 2015-09-10 MED ORDER — ENOXAPARIN SODIUM 30 MG/0.3ML ~~LOC~~ SOLN
30.0000 mg | SUBCUTANEOUS | Status: DC
  Administered 2015-09-11 (×2): 30 mg via SUBCUTANEOUS
  Filled 2015-09-10 (×2): qty 0.3

## 2015-09-10 MED ORDER — MIRTAZAPINE 15 MG PO TABS
15.0000 mg | ORAL_TABLET | Freq: Every day | ORAL | Status: DC
  Administered 2015-09-11 – 2015-09-15 (×6): 15 mg via ORAL
  Filled 2015-09-10 (×6): qty 1

## 2015-09-10 MED ORDER — HYDROMORPHONE HCL 1 MG/ML IJ SOLN: 0.5000 mg | INTRAMUSCULAR | Status: DC | PRN

## 2015-09-10 MED ORDER — ONDANSETRON HCL 4 MG PO TABS: 4.0000 mg | ORAL_TABLET | Freq: Four times a day (QID) | ORAL | Status: DC | PRN

## 2015-09-10 MED ORDER — RENA-VITE PO TABS
1.0000 | ORAL_TABLET | Freq: Every day | ORAL | Status: DC
  Administered 2015-09-11 – 2015-09-17 (×7): 1 via ORAL
  Filled 2015-09-10 (×9): qty 1

## 2015-09-10 MED ORDER — SODIUM CHLORIDE 0.9% FLUSH
3.0000 mL | INTRAVENOUS | Status: DC | PRN
  Administered 2015-09-12: 3 mL via INTRAVENOUS
  Filled 2015-09-10: qty 3

## 2015-09-10 MED ORDER — VANCOMYCIN HCL IN DEXTROSE 1-5 GM/200ML-% IV SOLN
1000.0000 mg | Freq: Once | INTRAVENOUS | Status: AC
  Administered 2015-09-10: 1000 mg via INTRAVENOUS
  Filled 2015-09-10: qty 200

## 2015-09-10 MED ORDER — PIPERACILLIN-TAZOBACTAM 3.375 G IVPB 30 MIN
3.3750 g | Freq: Once | INTRAVENOUS | Status: DC
  Filled 2015-09-10: qty 50

## 2015-09-10 MED ORDER — SODIUM CHLORIDE 0.9 % IV SOLN: 250.0000 mL | INTRAVENOUS | Status: DC | PRN

## 2015-09-10 MED ORDER — ACETAMINOPHEN 650 MG RE SUPP
650.0000 mg | Freq: Four times a day (QID) | RECTAL | Status: DC | PRN
  Administered 2015-09-19 – 2015-09-22 (×2): 650 mg via RECTAL
  Filled 2015-09-10 (×3): qty 1

## 2015-09-10 MED ORDER — SODIUM CHLORIDE 0.9% FLUSH
3.0000 mL | Freq: Two times a day (BID) | INTRAVENOUS | Status: DC
  Administered 2015-09-11 – 2015-09-22 (×16): 3 mL via INTRAVENOUS

## 2015-09-10 MED ORDER — LORAZEPAM 0.5 MG PO TABS: 0.5000 mg | ORAL_TABLET | Freq: Every evening | ORAL | Status: DC | PRN

## 2015-09-10 MED ORDER — ACETAMINOPHEN 325 MG PO TABS
650.0000 mg | ORAL_TABLET | Freq: Four times a day (QID) | ORAL | Status: DC | PRN
  Administered 2015-09-11 – 2015-09-15 (×5): 650 mg via ORAL
  Filled 2015-09-10 (×5): qty 2

## 2015-09-10 MED ORDER — VANCOMYCIN HCL 500 MG IV SOLR
500.0000 mg | Freq: Two times a day (BID) | INTRAVENOUS | Status: DC
  Filled 2015-09-10: qty 500

## 2015-09-10 MED ORDER — PIPERACILLIN-TAZOBACTAM 3.375 G IVPB
3.3750 g | Freq: Two times a day (BID) | INTRAVENOUS | Status: DC
  Administered 2015-09-11 – 2015-09-14 (×7): 3.375 g via INTRAVENOUS
  Filled 2015-09-10 (×10): qty 50

## 2015-09-10 NOTE — ED Provider Notes (Signed)
[place] DEPT Provider Note   CSN: [id number] Arrival date & time: 09/10/15  1614  First Provider Contact:  First MD Initiated Contact with Patient 09/10/15 1631        History   Chief Complaint No chief complaint on file.   HPI Charles [name] is a 40 y.o. male.  Level 5 caveat for acuity of condition and language barrier. Patient from cancer center with fever and tachycardia. Last dialysis session was this morning. Patient with history of multiple myelomas status post chemotherapy yesterday. Wife denies any fevers at home. One is somewhat of vomiting yesterday. Denies any diarrhea. Denies abdominal pain. Denies any cough, runny nose or sore throat. Denies any fever at home. Denies any chest pain or shortness of breath. Complains of pain in his bilateral hands. Has a mild headache as well. Still make some urine. Dialysis catheter right chest with a mature AV fistula to left AC. No Sick contacts at home.   The history is provided by the patient and the spouse. The history is limited by the condition of the patient and a language barrier.    Past Medical History:  Diagnosis Date  . ESRD (end stage renal disease) on dialysis ([place])    [address] T TH S (08/06/2015)  . GERD (gastroesophageal reflux disease)   . Headache    "monthly" (08/06/2015)  . History of blood transfusion 07/2015   "related to his kidneys"  . Hypertension   . IgG myeloma ([place])   . Kidney stones   . Lytic bone lesions on xray   . Neuromuscular disorder ([place])    states both arms are shaking a lot    Patient Active Problem List   Diagnosis Date Noted  . Anemia 08/19/2015  . Fever 08/06/2015  . Hemodialysis patient ([place]) 07/29/2015  . Back pain   . Multiple myeloma not having achieved remission ([place])   . FUO (fever of unknown origin)   . PRES (posterior reversible encephalopathy syndrome)   . HTN (hypertension), malignant 06/26/2015  . Blurry vision, bilateral   . Pain from bone metastases ([place])  02/19/2015  . Rash 01/21/2015  . Cancer associated pain 01/21/2015  . Nausea with vomiting 01/21/2015  . Hypoalbuminemia due to protein-calorie malnutrition ([place]) 12/31/2014  . Prostatitis 12/28/2014  . Hordeolum externum of right eye 12/18/2014  . ESRD (end stage renal disease) on dialysis ([place]) 02/08/2014  . Lytic bone lesions on xray 01/30/2014    Past Surgical History:  Procedure Laterality Date  . AV FISTULA PLACEMENT Left 07/13/2015   Procedure: CREATION OF LEFT UPPER ARM  ARTERIOVENOUS (AV) FISTULA ;  Surgeon: Elam Dutch, MD;  Location: [facility];  Service: Vascular;  Laterality: Left;  . INSERTION OF DIALYSIS CATHETER Right ~ 07/2015   chest       Home Medications    Prior to Admission medications   Medication Sig Start Date End Date Taking? Authorizing Provider  acyclovir (ZOVIRAX) 200 MG capsule Take 2 capsules (400 mg total) by mouth daily. 08/19/15   Maryanna Shape, NP  Amino Acids-Protein Hydrolys (FEEDING SUPPLEMENT, PRO-STAT SUGAR FREE 64,) LIQD Take 30 mLs by mouth 2 (two) times daily. 07/10/15   Modena Jansky, MD  calcium acetate (PHOSLO) 667 MG capsule Take 2 capsules (1,334 mg total) by mouth 3 (three) times daily with meals. 07/10/15   Modena Jansky, MD  Cholecalciferol (VITAMIN D PO) Take 500 Units by mouth daily.    Historical Provider, MD  dexamethasone (DECADRON) 4 MG  tablet Take 5 tablets (67m) on Saturday and Sunday every week (tome [place]. 07/11/15   GChauncey Cruel MD  ferrous sulfate 325 (65 FE) MG tablet Take 1 tablet (325 mg total) by mouth 2 (two) times daily with a meal. 08/10/15   VHosie Poisson MD  hydrALAZINE (APRESOLINE) 25 MG tablet Take 1 tablet (25 mg total) by mouth 3 (three) times daily. 09/07/15   EMelony Overly MD  LORazepam (ATIVAN) 0.5 MG tablet Take 1 tablet (0.5 mg total) by mouth at bedtime as needed for anxiety. 08/26/15   GChauncey Cruel MD  metoprolol (LOPRESSOR) 50 MG tablet Take 1  tablet (50 mg total) by mouth 2 (two) times daily. 09/07/15   EMelony Overly MD  mirtazapine (REMERON) 15 MG tablet Take 1 tablet (15 mg total) by mouth at bedtime. 07/10/15   AModena Jansky MD  morphine (MS CONTIN) 15 MG 12 hr tablet Take 1 tablet (15 mg total) by mouth every 12 (twelve) hours. 08/26/15   GChauncey Cruel MD  multivitamin (RENA-VIT) TABS tablet Take 1 tablet by mouth at bedtime. 07/10/15   AModena Jansky MD  Nutritional Supplements (FEEDING SUPPLEMENT, NEPRO CARB STEADY,) LIQD Take 237 mLs by mouth 2 (two) times daily between meals. 07/10/15   AModena Jansky MD  prochlorperazine (COMPAZINE) 10 MG tablet Take 1 tablet (10 mg total) by mouth every 6 (six) hours as needed for nausea or vomiting. 06/11/15   GChauncey Cruel MD  senna-docusate (SENOKOT-S) 8.6-50 MG tablet Take 2 tablets by mouth 2 (two) times daily. 07/10/15   AModena Jansky MD  traMADol (ULTRAM) 50 MG tablet Take 1 tablet (50 mg total) by mouth every 6 (six) hours as needed. Patient not taking: Reported on 08/27/2015 06/11/15   GChauncey Cruel MD    Family History Family History  Problem Relation Age of Onset  . Diabetes Father     Social History Social History  Substance Use Topics  . Smoking status: Never Smoker  . Smokeless tobacco: Never Used  . Alcohol use No     Allergies   Ciprofloxacin and Mobic [meloxicam]   Review of Systems Review of Systems  Constitutional: Positive for activity change, appetite change, chills, fatigue and fever.  Respiratory: Negative for cough and shortness of breath.   Cardiovascular: Negative for chest pain.  Gastrointestinal: Positive for nausea and vomiting. Negative for abdominal pain and diarrhea.  Genitourinary: Negative for dysuria, hematuria and urgency.  Musculoskeletal: Positive for arthralgias and myalgias. Negative for neck pain and neck stiffness.  Skin: Negative for wound.  Neurological: Positive for weakness. Negative for dizziness,  light-headedness and numbness.   A complete 10 system review of systems was obtained and all systems are negative except as noted in the HPI and PMH.    Physical Exam Updated Vital Signs BP 144/91 (BP Location: Right Arm)   Pulse (!) 137   Temp 101.6 F (38.7 C) (Oral)   Resp 20   Ht 5' 5"  (1.651 m)   Wt 128 lb 11.2 oz (58.4 kg)   SpO2 97%   BMI 21.42 kg/m   Physical Exam  Constitutional: He is oriented to person, place, and time. He appears well-developed and well-nourished. No distress.  Ill appearing, diaphoretic  HENT:  Head: Normocephalic and atraumatic.  Mouth/Throat: Oropharynx is clear and moist. No oropharyngeal exudate.  Eyes: Conjunctivae and EOM are normal. Pupils are equal, round, and reactive to  light.  Neck: Normal range of motion. Neck supple.  No meningismus.  Cardiovascular: Normal rate, normal heart sounds and intact distal pulses.   No murmur heard. Tachycardic to 130s  Pulmonary/Chest: Effort normal and breath sounds normal. No respiratory distress.  Dialysis catheter R chest  Abdominal: Soft. There is no tenderness. There is no rebound and no guarding.  Musculoskeletal: Normal range of motion. He exhibits no edema or tenderness.  L AC fistula with thrill  Neurological: He is alert and oriented to person, place, and time. No cranial nerve deficit. He exhibits normal muscle tone. Coordination normal.  No ataxia on finger to nose bilaterally. No pronator drift. 5/5 strength throughout. CN 2-12 intact.Equal grip strength. Sensation intact.   Skin: Skin is warm. He is diaphoretic.  Psychiatric: He has a normal mood and affect. His behavior is normal.  Nursing note and vitals reviewed.    ED Treatments / Results  Labs (all labs ordered are listed, but only abnormal results are displayed) Labs Reviewed  COMPREHENSIVE METABOLIC PANEL - Abnormal; Notable for the following:       Result Value   Sodium 131 (*)    Chloride 94 (*)    Glucose, Bld 114 (*)      Creatinine, Ser 2.21 (*)    Total Protein 6.3 (*)    Albumin 1.6 (*)    Alkaline Phosphatase 322 (*)    GFR calc non Af Amer 35 (*)    GFR calc Af Amer 41 (*)    All other components within normal limits  CBC WITH DIFFERENTIAL/PLATELET - Abnormal; Notable for the following:    WBC 18.7 (*)    RBC 2.29 (*)    Hemoglobin 6.9 (*)    HCT 22.2 (*)    RDW 18.2 (*)    nRBC 1 (*)    Neutro Abs 12.5 (*)    Monocytes Absolute 2.1 (*)    All other components within normal limits  PROTIME-INR - Abnormal; Notable for the following:    Prothrombin Time 15.6 (*)    All other components within normal limits  APTT - Abnormal; Notable for the following:    aPTT 37 (*)    All other components within normal limits  PHOSPHORUS - Abnormal; Notable for the following:    Phosphorus 2.2 (*)    All other components within normal limits  I-STAT CG4 LACTIC ACID, ED - Abnormal; Notable for the following:    Lactic Acid, Venous 3.24 (*)    All other components within normal limits  CULTURE, BLOOD (ROUTINE X 2)  CULTURE, BLOOD (ROUTINE X 2)  BASIC METABOLIC PANEL  CBC  I-STAT CG4 LACTIC ACID, ED  POC OCCULT BLOOD, ED  TYPE AND SCREEN    EKG  EKG Interpretation  Date/Time:  Thursday September 10 2015 16:18:13 EDT Ventricular Rate:  138 PR Interval:  144 QRS Duration: 82 QT Interval:  278 QTC Calculation: 421 R Axis:   65 Text Interpretation:  Sinus tachycardia Possible Inferior infarct , age undetermined Abnormal ECG Nonspecific ST and T wave abnormality Rate faster Confirmed by Wyvonnia Dusky  MD, Tomothy Eddins [phone]) on 09/10/2015 4:47:18 PM       Radiology Dg Chest 2 View  Result Date: 09/10/2015 CLINICAL DATA:  Fever and weakness. EXAM: CHEST  2 VIEW COMPARISON:  Multiple chest x-rays since July 06, 2015 FINDINGS: No pneumothorax. Bilateral pleural effusions and underlying opacities are identified. A rounded density in the lateral left lower lung measures up to 3.2 cm and is  masslike in appearance.  However, this was not present on multiple recent chest x-rays. No other nodules or masses. No overt edema. A right dialysis catheter is again identified. The cardiomediastinal silhouette is stable. IMPRESSION: 1. Rounded masslike low-attenuation density in the lateral left lung not seen on multiple recent chest x-rays. This may represent some loculated fluid. Recommend short-term follow-up to ensure resolution. 2. Bilateral pleural effusions with underlying atelectasis. 3. No other acute abnormalities. Electronically Signed   By: Dorise Bullion III M.D   On: 09/10/2015 17:28    Procedures Procedures (including critical care time)  Medications Ordered in ED Medications  multivitamin (RENA-VIT) tablet 1 tablet (not administered)  LORazepam (ATIVAN) tablet 0.5 mg (not administered)  mirtazapine (REMERON) tablet 15 mg (not administered)  enoxaparin (LOVENOX) injection 30 mg (not administered)  acetaminophen (TYLENOL) tablet 650 mg (not administered)    Or  acetaminophen (TYLENOL) suppository 650 mg (not administered)  sodium chloride flush (NS) 0.9 % injection 3 mL (not administered)  sodium chloride flush (NS) 0.9 % injection 3 mL (not administered)  0.9 %  sodium chloride infusion (not administered)  ondansetron (ZOFRAN) tablet 4 mg (not administered)    Or  ondansetron (ZOFRAN) injection 4 mg (not administered)  HYDROmorphone (DILAUDID) injection 0.5-1 mg (not administered)  piperacillin-tazobactam (ZOSYN) IVPB 3.375 g (not administered)  vancomycin (VANCOCIN) 500 mg in sodium chloride 0.9 % 100 mL IVPB (not administered)  sodium chloride 0.9 % bolus 1,000 mL (0 mLs Intravenous Stopped 09/10/15 1752)    And  sodium chloride 0.9 % bolus 1,000 mL (0 mLs Intravenous Stopped 09/10/15 1912)  acetaminophen (TYLENOL) tablet 650 mg (650 mg Oral Given 09/10/15 1644)  sodium chloride 0.9 % bolus 1,000 mL (0 mLs Intravenous Stopped 09/10/15 1912)    And  sodium chloride 0.9 % bolus 1,000 mL (0 mLs  Intravenous Stopped 09/10/15 1752)  piperacillin-tazobactam (ZOSYN) IVPB 3.375 g (0 g Intravenous Stopped 09/10/15 1721)  vancomycin (VANCOCIN) IVPB 1000 mg/200 mL premix (0 mg Intravenous Stopped 09/10/15 1801)     Initial Impression / Assessment and Plan / ED Course  I have reviewed the triage vital signs and the nursing notes.  Pertinent labs & imaging results that were available during my care of the patient were reviewed by me and considered in my medical decision making (see chart for details).  Clinical Course  Patient ill appearing. He is tachycardic and febrile. Code sepsis activated.  Labs show elevated lactate of 3.2. Leukocytosis noted with worsening anemia. Hemoccult obtained.  Given IV fluids, IV antibiotics after cultures obtained. Chest x-ray shows possible loculated fluid collection.  BP and mental status stable in the ED.  HR improving.  Admission dw/ Dr. Jonnie Finner.  CRITICAL CARE Performed by: Ezequiel Essex Total critical care time: 45 minutes Critical care time was exclusive of separately billable procedures and treating other patients. Critical care was necessary to treat or prevent imminent or life-threatening deterioration. Critical care was time spent personally by me on the following activities: development of treatment plan with patient and/or surrogate as well as nursing, discussions with consultants, evaluation of patient's response to treatment, examination of patient, obtaining history from patient or surrogate, ordering and performing treatments and interventions, ordering and review of laboratory studies, ordering and review of radiographic studies, pulse oximetry and re-evaluation of patient's condition.  Final Clinical Impressions(s) / ED Diagnoses   Final diagnoses:  None    New Prescriptions New Prescriptions   No medications on file     Ezequiel Essex, MD  09/10/15 2243

## 2015-09-10 NOTE — Progress Notes (Signed)
Patient presented to infusion room for scheduled treatment.  Patient c/o bilateral upper arm pain without injury. States he has had nausea since yesterday with one episode of emesis after treatment yesterday. Also verbalizes increased fatigue. Patient appears ill. Dr. Jana Hakim notified of patient's condition by Marlon Pel, RN. Advised to send patient to [facility] ED for evaluation/tx/admission. Patient/patient's wife notified of plan of care. Verbalizes understanding.

## 2015-09-10 NOTE — Telephone Encounter (Signed)
Wife called to change appt time due to pt dialysis appt not over until 130 pm today. Also Informed pt of 8/17 added infusion appt pr MD orders

## 2015-09-10 NOTE — H&P (Signed)
[facility] History and Physical  [name] VKF:[id number] DOB: [birth year] DOA: 09/10/2015  Referring physician: Dr Wyvonnia Dusky PCP: [name], MD   Chief Complaint: ESRD pt w myeloma presenting to ED with report of  HPI: Charles [name] is a 40 y.o. male with hx of mult myeloma and ESRD on HD TTS who presented to cancer center for chemo today and was sent to ED for fever and tachycardia.  Wife says this started today, yesterday he was "fine".  Has been on about 18 mos of chemoRx for multiple myeloma , not all the same agent.  He started dialysis in May of this year and has a maturing AVF L upper arm.  Reports mild cough for several days, nonproductive.  He got chemo yest (gets chemoRx every Wed and Thurs for 3 wks then has one week off, he is close to finishing his 18 mos of chemo).    His chemoRx is carfilzomib.    Has c/o bilat foot pain and hand pains for a few weeks.  No abd pain.  Feels bad off and on, good and bad days.  After chemo he usually has n/v and doesn't eat well.    Born in [place], [place] around age 40.  Him and his wife have 5 kids, ages 2, 59, 52, 33 and 2.  No etoh / drugs/ smoking.  They have a house, he works as a Theme park manager.    Admissions: Jan 2016 - admit and dx'd with mult myeloma [facility], lytic bone lesions and CKD) Dec 2016 - acute / chron renal failure w creat 5.3 on admit and 3.0 on day of dc, avoid nsaids'/ contrast May 2017 - nausea/ vomiting, a/c renal w PRES syndrome and malig HTN, PNA and vision disturbance, fevers were tumor fevers.  Started on HD, L BC AVF placed on 07/13/15. Cx's neg. July 2017 - fevers, not sure tumor fever vs PNA vs UTI; got 5 days IV abx and dc'd on po augmentin  ROS  denies CP  no joint pain   no HA  no blurry vision  no rash  no diarrhea  no nausea/ vomiting  no dysuria  no difficulty voiding  no change in urine color    Past Medical History  Past Medical History:  Diagnosis Date  . ESRD  (end stage renal disease) on dialysis ([place])    [address] T TH S (08/06/2015)  . GERD (gastroesophageal reflux disease)   . Headache    "monthly" (08/06/2015)  . History of blood transfusion 07/2015   "related to his kidneys"  . Hypertension   . IgG myeloma ([place])   . Kidney stones   . Lytic bone lesions on xray   . Neuromuscular disorder ([place])    states both arms are shaking a lot   Past Surgical History  Past Surgical History:  Procedure Laterality Date  . AV FISTULA PLACEMENT Left 07/13/2015   Procedure: CREATION OF LEFT UPPER ARM  ARTERIOVENOUS (AV) FISTULA ;  Surgeon: Elam Dutch, MD;  Location: [place];  Service: Vascular;  Laterality: Left;  . INSERTION OF DIALYSIS CATHETER Right ~ 07/2015   chest   Family History  Family History  Problem Relation Age of Onset  . Diabetes Father    Social History  reports that he has never smoked. He has never used smokeless tobacco. He reports that he does not drink alcohol or use drugs. Allergies  Allergies  Allergen Reactions  . Ciprofloxacin Rash  .  Mobic [Meloxicam] Rash   Home medications Prior to Admission medications   Medication Sig Start Date End Date Taking? Authorizing Provider  acyclovir (ZOVIRAX) 200 MG capsule Take 2 capsules (400 mg total) by mouth daily. 08/19/15  Yes Maryanna Shape, NP  Amino Acids-Protein Hydrolys (FEEDING SUPPLEMENT, PRO-STAT SUGAR FREE 64,) LIQD Take 30 mLs by mouth 2 (two) times daily. 07/10/15  Yes Modena Jansky, MD  calcium acetate (PHOSLO) 667 MG capsule Take 2 capsules (1,334 mg total) by mouth 3 (three) times daily with meals. 07/10/15  Yes Modena Jansky, MD  dexamethasone (DECADRON) 4 MG tablet Take 5 tablets (27m) on Saturday and Sunday every week (tome [place]. 07/11/15  Yes GChauncey Cruel MD  ferrous sulfate 325 (65 FE) MG tablet Take 1 tablet (325 mg total) by mouth 2 (two) times daily with a meal. 08/10/15  Yes VHosie Poisson MD   hydrALAZINE (APRESOLINE) 25 MG tablet Take 1 tablet (25 mg total) by mouth 3 (three) times daily. 09/07/15  Yes EMelony Overly MD  LORazepam (ATIVAN) 0.5 MG tablet Take 1 tablet (0.5 mg total) by mouth at bedtime as needed for anxiety. 08/26/15  Yes GChauncey Cruel MD  metoprolol (LOPRESSOR) 50 MG tablet Take 1 tablet (50 mg total) by mouth 2 (two) times daily. 09/07/15  Yes EMelony Overly MD  mirtazapine (REMERON) 15 MG tablet Take 1 tablet (15 mg total) by mouth at bedtime. 07/10/15  Yes AModena Jansky MD  morphine (MS CONTIN) 15 MG 12 hr tablet Take 1 tablet (15 mg total) by mouth every 12 (twelve) hours. 08/26/15  Yes GChauncey Cruel MD  Nutritional Supplements (FEEDING SUPPLEMENT, NEPRO CARB STEADY,) LIQD Take 237 mLs by mouth 2 (two) times daily between meals. 07/10/15  Yes AModena Jansky MD  prochlorperazine (COMPAZINE) 10 MG tablet Take 1 tablet (10 mg total) by mouth every 6 (six) hours as needed for nausea or vomiting. 06/11/15  Yes GChauncey Cruel MD  senna-docusate (SENOKOT-S) 8.6-50 MG tablet Take 2 tablets by mouth 2 (two) times daily. 07/10/15  Yes AModena Jansky MD  multivitamin (RENA-VIT) TABS tablet Take 1 tablet by mouth at bedtime. Patient not taking: Reported on 09/10/2015 07/10/15   AModena Jansky MD  traMADol (ULTRAM) 50 MG tablet Take 1 tablet (50 mg total) by mouth every 6 (six) hours as needed. Patient not taking: Reported on 08/27/2015 06/11/15   GChauncey Cruel MD   Liver Function Tests  Recent Labs Lab 09/09/15 1344 09/10/15 1424 09/10/15 1711  AST 32 37* 36  ALT 17 23 23   ALKPHOS 275* 429* 322*  BILITOT 0.34 0.32 0.3  PROT 6.9 7.1 6.3*  ALBUMIN 1.5* 1.5* 1.6*   No results for input(s): LIPASE, AMYLASE in the last 168 hours. CBC  Recent Labs Lab 09/09/15 1249 09/10/15 1424 09/10/15 1711  WBC 19.1* 20.7* 18.7*  NEUTROABS 14.7* 14.2* 12.5*  HGB 7.6* 7.6* 6.9*  HCT 24.0* 24.4* 22.2*  MCV 93.3 95.7 96.9  PLT 419* 357 3465  Basic Metabolic  Panel  Recent Labs Lab 09/07/15 1221 09/09/15 1344 09/10/15 1424 09/10/15 1711  NA 131* 132* 133* 131*  K 4.7 4.4 3.7 3.6  CL 95*  --   --  94*  CO2  --  25 29 28   GLUCOSE 200* 81 95 114*  BUN 49* 39.5* 11.4 9  CREATININE 6.40* 4.2* 1.9* 2.21*  CALCIUM  --  10.5*  10.9* 9.9     Vitals:   09/10/15 1636 09/10/15 1653 09/10/15 1730 09/10/15 1745  BP:   135/79 119/76  Pulse: (!) 128  (!) 121 118  Resp: 19   21  Temp:      TempSrc:      SpO2: 96%  92% 95%  Weight:  58.1 kg (128 lb)    Height:  5' 5"  (1.651 m)     Exam: Gen tired appearing, pale, responsive, not in distress No rash, cyanosis or gangrene Sclera anicteric, throat clear  No jvd or bruits, no nodes Chest clear bilat to bases, R chest TDC clean exit site RRR no MRG Abd soft ntnd no mass or ascites +bs GU normal male MS no joint effusions or deformity Ext no LE or UE edema / no wounds or ulcers LUA AVF +bruit, wounds well-healed Neuro is alert, Ox 3   Na 131 K 3.6  CO2 28  BUN 9  Cr 2.21  Ca 9.9  Glu 114 Alb 1.6  AST 36 / ALT 23  APhos 322   Tbili 0.3 WBC 18k  Hb 6.9 (last 7.6)  plt 349 Lactic acid 3.24  INR 1.23  Home meds: zovirax, phoslo, feso4, hydralazine 25 tid, MTP 50 bid, ativan prn, remeron, MS contin, Nepro, compazine prn, senokot 2 bid, Rena-vit Ultram 50 q 6 prn   EKG (independ reviewed) > sinus tach 138, Q's in III/ AVF poss old MI, no acute changes o/w CXR (independ reviewed) > clear lungs except pleural-based lung mass on L side which is new   Assessment: 1  Fever, r/o sepsis - poss catheter-related sepsis, bcx's drawn.  Less likely PNA. Got  4 L IVF in ED, BP's ok and heart rate down some. No further IVF needed.   2  ESRD HD TTS, had HD today 3  Mult myeloma - getting active chemo, last dose yesterday (none today as febrile) 4  Anemia - d/t cancer and esrd 5  Malnutrition - alb 1.6 6  HTN on hydral/ MTP, hold for now 7  Pleural-based lung mass - may be related to #3  Plan - IVF, IV  abx, f/u cx's, consult nephrology in am. Clear liquids.  Hold po home meds for now.      Sol Blazing [facility] Pager [phone]  Cell [phone]  If 7PM-7AM, please contact night-coverage [email or web address] Password [facility] 09/10/2015, 6:57 PM

## 2015-09-10 NOTE — Progress Notes (Addendum)
Pharmacy Antibiotic Note  Charles [name] is a 40 y.o. male admitted on 09/10/2015 with sepsis.  Pharmacy has been consulted for Vanc/Zosyn dosing. WBC 20.7, Tmax 101.6. Note pt has ESRD and receives HD T/Th/Sat, last received HD today.  Plan: -Zosyn 3.375 g IV q12h -Vancomycin 1000 mg IV x1 -Vancomycin 500 mg IV with HD T/Th/Sat -Monitor clinical status, SCr, CBC, LOT, VT as needed -F/U Cx data  Height: '5\' 5"'$  (165.1 cm) Weight: 128 lb 11.2 oz (58.4 kg) IBW/kg (Calculated) : 61.5  Temp (24hrs), Avg:100.5 F (38.1 C), Min:98.7 F (37.1 C), Max:101.6 F (38.7 C)   Recent Labs Lab 09/07/15 1221 09/09/15 1249 09/09/15 1344 09/10/15 1424  WBC  --  19.1*  --  20.7*  CREATININE 6.40*  --  4.2* 1.9*    Estimated Creatinine Clearance: 42.7 mL/min (by C-G formula based on SCr of 1.9 mg/dL).    Allergies  Allergen Reactions  . Ciprofloxacin Rash  . Mobic [Meloxicam] Rash    Antimicrobials this admission: 8/10 Vanc >>  8/10 Zosyn >>   Dose adjustments this admission: n/a  Microbiology results: 8/10 BCx: IP 8/10 UCx: IP  Thank you for allowing pharmacy to be a part of this patient's care.  Arrie Senate, PharmD PGY-1 Pharmacy Resident Pager: [phone] 09/10/2015

## 2015-09-10 NOTE — ED Triage Notes (Signed)
Patient here for rapid heart rate and unable to receive chemo today because of his HR. Patient did have full dialysis treatment today. On arrival skin hot to touch and patient pale and weak, denies pain.

## 2015-09-10 NOTE — ED Notes (Signed)
Report attempted 

## 2015-09-10 NOTE — Telephone Encounter (Signed)
Pt presented to chemo room for day 2 Kyprolis.  Noted calcium level per lab yesterday was 10.5 .  Per visit today calcium rechecked with continued elevation- pt has fever of 101.3 with pulse of 130. Pt states increased pain - as well as n/v post day 1 treatment yesterday.  Per MD review- pt needs to proceed to the ER at [facility] due to being under active dialysis.  Call report given to [facility] in [facility] ER.  Pt being transported by wife in personal car.

## 2015-09-11 DIAGNOSIS — E872 Acidosis: Secondary | ICD-10-CM

## 2015-09-11 LAB — MULTIPLE MYELOMA PANEL, SERUM
ALBUMIN SERPL ELPH-MCNC: 1.9 g/dL — AB (ref 2.9–4.4)
ALPHA 1: 0.4 g/dL (ref 0.0–0.4)
ALPHA2 GLOB SERPL ELPH-MCNC: 1.6 g/dL — AB (ref 0.4–1.0)
Albumin/Glob SerPl: 0.5 — ABNORMAL LOW (ref 0.7–1.7)
B-GLOBULIN SERPL ELPH-MCNC: 0.5 g/dL — AB (ref 0.7–1.3)
Gamma Glob SerPl Elph-Mcnc: 1.5 g/dL (ref 0.4–1.8)
Globulin, Total: 4.1 g/dL — ABNORMAL HIGH (ref 2.2–3.9)
IGM (IMMUNOGLOBIN M), SRM: 25 mg/dL (ref 20–172)
IgA, Qn, Serum: 9 mg/dL — ABNORMAL LOW (ref 90–386)
M PROTEIN SERPL ELPH-MCNC: 1.4 g/dL — AB
TOTAL PROTEIN: 6 g/dL (ref 6.0–8.5)

## 2015-09-11 LAB — URINALYSIS, ROUTINE W REFLEX MICROSCOPIC
GLUCOSE, UA: 100 mg/dL — AB
Ketones, ur: 15 mg/dL — AB
Nitrite: NEGATIVE
Specific Gravity, Urine: 1.016 (ref 1.005–1.030)
pH: 7.5 (ref 5.0–8.0)

## 2015-09-11 LAB — CBC
HCT: 20.3 % — ABNORMAL LOW (ref 39.0–52.0)
Hemoglobin: 6.1 g/dL — CL (ref 13.0–17.0)
MCH: 29.6 pg (ref 26.0–34.0)
MCHC: 30 g/dL (ref 30.0–36.0)
MCV: 98.5 fL (ref 78.0–100.0)
Platelets: 322 10*3/uL (ref 150–400)
RBC: 2.06 MIL/uL — ABNORMAL LOW (ref 4.22–5.81)
RDW: 18.9 % — ABNORMAL HIGH (ref 11.5–15.5)
WBC: 20.7 10*3/uL — ABNORMAL HIGH (ref 4.0–10.5)

## 2015-09-11 LAB — URINE MICROSCOPIC-ADD ON

## 2015-09-11 LAB — BASIC METABOLIC PANEL
ANION GAP: 8 (ref 5–15)
BUN: 14 mg/dL (ref 6–20)
CALCIUM: 8.7 mg/dL — AB (ref 8.9–10.3)
CO2: 23 mmol/L (ref 22–32)
Chloride: 101 mmol/L (ref 101–111)
Creatinine, Ser: 2.83 mg/dL — ABNORMAL HIGH (ref 0.61–1.24)
GFR, EST AFRICAN AMERICAN: 30 mL/min — AB (ref >60)
GFR, EST NON AFRICAN AMERICAN: 26 mL/min — AB (ref >60)
GLUCOSE: 73 mg/dL (ref 65–99)
POTASSIUM: 3.6 mmol/L (ref 3.5–5.1)
Sodium: 132 mmol/L — ABNORMAL LOW (ref 135–145)

## 2015-09-11 LAB — MRSA PCR SCREENING: MRSA BY PCR: NEGATIVE

## 2015-09-11 LAB — PREPARE RBC (CROSSMATCH)

## 2015-09-11 MED ORDER — METOPROLOL TARTRATE 25 MG/10 ML ORAL SUSPENSION
50.0000 mg | Freq: Two times a day (BID) | ORAL | Status: DC
  Administered 2015-09-11 – 2015-09-15 (×7): 50 mg via ORAL
  Filled 2015-09-11 (×7): qty 20

## 2015-09-11 MED ORDER — SODIUM CHLORIDE 0.9 % IV SOLN
Freq: Once | INTRAVENOUS | Status: AC
  Administered 2015-09-11: 12:00:00 via INTRAVENOUS

## 2015-09-11 MED ORDER — DOXERCALCIFEROL 4 MCG/2ML IV SOLN
5.0000 ug | INTRAVENOUS | Status: DC
  Administered 2015-09-12 – 2015-09-19 (×4): 5 ug via INTRAVENOUS
  Filled 2015-09-11 (×4): qty 4

## 2015-09-11 MED ORDER — SODIUM CHLORIDE 0.9 % IV BOLUS (SEPSIS)
250.0000 mL | Freq: Once | INTRAVENOUS | Status: AC
  Administered 2015-09-11: 250 mL via INTRAVENOUS

## 2015-09-11 MED ORDER — CALCIUM CARBONATE ANTACID 500 MG PO CHEW
1.0000 | CHEWABLE_TABLET | Freq: Three times a day (TID) | ORAL | Status: DC
  Administered 2015-09-11 – 2015-09-18 (×17): 200 mg via ORAL
  Filled 2015-09-11 (×20): qty 1

## 2015-09-11 NOTE — Consult Note (Signed)
[facility] Renal Consultation Note    Indication for Consultation:  Management of ESRD/hemodialysis; anemia, hypertension/volume and secondary hyperparathyroidism PCP:  HPI: Charles [name] is a 40 y.o. 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 He has hemodialysis T,Th, S at [facility]. PMH of IgG myeloma, lytic bone lesions, HTN, GERD, kidney stones, anemia of chronic disease. Has been on chemotherapy for for approximately 18 months using multiple agents including carfilzomib. His chemotherapy schedule is every Wednesday and Thursday for three weeks, then off one week.  History is obtained per wife, EMR and some from patient but he speaks very little [place]. Patient had hemodialysis Thursday morning on schedule and went to cancer center for his treatment. Wife states that he felt fine, had no complaints but when he arrived, he was found to have increased HR and fever. He was sent to [facility] for evaluation. Upon arrival to ED T-101.6 BP 144/91 HR 137 RR 20. NA 131 K+ 3.6 Co2 28 Scr 2.21 BUN 9 Alk phos 322 Albumin 1.6 WBC 18.7 HGB 6.9 HCT 22.2 PLT 349 mod L.shift on Diff. Lactic acid 3.24. He rec'd 4 liters of NS in ED. BC X 2 were done and he has been started on Vanc/Zosyn per primary. He has rec'd 1 unit of PRBCs. He was admitted to SDU for sepsis. We have been asked to manage his hemodialysis.  Currently he says he feels betters. Denies pain at present, still has low grade temperature, is under blankets, occasionally shivering. Denies SOB/chest pain,DOE, bone pain, abdominal pain, N, V, D, flank pain, tarry stools, melena, hematochezia, dysuria, headache, dizziness, vision or hearing changes.   Past Medical History:  Diagnosis Date  . ESRD (end stage renal disease) on dialysis ([place])    [address] T TH S (08/06/2015)  . GERD (gastroesophageal reflux disease)   . Headache    "monthly" (08/06/2015)  . History of blood  transfusion 07/2015   "related to his kidneys"  . Hypertension   . IgG myeloma ([place])   . Kidney stones   . Lytic bone lesions on xray   . Neuromuscular disorder ([place])    states both arms are shaking a lot   Past Surgical History:  Procedure Laterality Date  . AV FISTULA PLACEMENT Left 07/13/2015   Procedure: CREATION OF LEFT UPPER ARM  ARTERIOVENOUS (AV) FISTULA ;  Surgeon: Elam Dutch, MD;  Location: [place];  Service: Vascular;  Laterality: Left;  . INSERTION OF DIALYSIS CATHETER Right ~ 07/2015   chest   Family History  Problem Relation Age of Onset  . Diabetes Father    Social History:  reports that he has never smoked. He has never used smokeless tobacco. He reports that he does not drink alcohol or use drugs. Allergies  Allergen Reactions  . Ciprofloxacin Rash  . Mobic [Meloxicam] Rash   Prior to Admission medications   Medication Sig Start Date End Date Taking? Authorizing Provider  acyclovir (ZOVIRAX) 200 MG capsule Take 2 capsules (400 mg total) by mouth daily. 08/19/15  Yes Maryanna Shape, NP  Amino Acids-Protein Hydrolys (FEEDING SUPPLEMENT, PRO-STAT SUGAR FREE 64,) LIQD Take 30 mLs by mouth 2 (two) times daily. 07/10/15  Yes Modena Jansky, MD  calcium acetate (PHOSLO) 667 MG capsule Take 2 capsules (1,334 mg total) by mouth 3 (three) times daily with meals. 07/10/15  Yes Modena Jansky, MD  dexamethasone (DECADRON) 4 MG tablet Take 5 tablets (83m) on Saturday  and Sunday every week (tome [place] y [place]). 07/11/15  Yes [name], MD  ferrous sulfate 325 (65 FE) MG tablet Take 1 tablet (325 mg total) by mouth 2 (two) times daily with a meal. 08/10/15  Yes Hosie Poisson, MD  hydrALAZINE (APRESOLINE) 25 MG tablet Take 1 tablet (25 mg total) by mouth 3 (three) times daily. 09/07/15  Yes Melony Overly, MD  LORazepam (ATIVAN) 0.5 MG tablet Take 1 tablet (0.5 mg total) by mouth at bedtime as needed for anxiety. 08/26/15  Yes [name], MD  metoprolol (LOPRESSOR) 50 MG tablet Take 1 tablet (50 mg total) by mouth 2 (two) times daily. 09/07/15  Yes Melony Overly, MD  mirtazapine (REMERON) 15 MG tablet Take 1 tablet (15 mg total) by mouth at bedtime. 07/10/15  Yes Modena Jansky, MD  morphine (MS CONTIN) 15 MG 12 hr tablet Take 1 tablet (15 mg total) by mouth every 12 (twelve) hours. 08/26/15  Yes [name], MD  Nutritional Supplements (FEEDING SUPPLEMENT, NEPRO CARB STEADY,) LIQD Take 237 mLs by mouth 2 (two) times daily between meals. 07/10/15  Yes Modena Jansky, MD  prochlorperazine (COMPAZINE) 10 MG tablet Take 1 tablet (10 mg total) by mouth every 6 (six) hours as needed for nausea or vomiting. 06/11/15  Yes [name], MD  senna-docusate (SENOKOT-S) 8.6-50 MG tablet Take 2 tablets by mouth 2 (two) times daily. 07/10/15  Yes Modena Jansky, MD  multivitamin (RENA-VIT) TABS tablet Take 1 tablet by mouth at bedtime. Patient not taking: Reported on 09/10/2015 07/10/15   Modena Jansky, MD  traMADol (ULTRAM) 50 MG tablet Take 1 tablet (50 mg total) by mouth every 6 (six) hours as needed. Patient not taking: Reported on 08/27/2015 06/11/15   [name], MD   Current Facility-Administered Medications  Medication Dose Route Frequency Provider Last Rate Last Dose  . 0.9 %  sodium chloride infusion  250 mL Intravenous PRN Roney Jaffe, MD      . acetaminophen (TYLENOL) tablet 650 mg  650 mg Oral Q6H PRN Roney Jaffe, MD   650 mg at 09/11/15 0400   Or  . acetaminophen (TYLENOL) suppository 650 mg  650 mg Rectal Q6H PRN Roney Jaffe, MD      . enoxaparin (LOVENOX) injection 30 mg  30 mg Subcutaneous Q24H Roney Jaffe, MD   30 mg at 09/11/15 0118  . HYDROmorphone (DILAUDID) injection 0.5-1 mg  0.5-1 mg Intravenous Q4H PRN Roney Jaffe, MD      . LORazepam (ATIVAN) tablet 0.5 mg  0.5 mg Oral QHS PRN Roney Jaffe, MD      . mirtazapine (REMERON) tablet 15 mg  15 mg Oral QHS Roney Jaffe, MD   15 mg at  09/11/15 0118  . multivitamin (RENA-VIT) tablet 1 tablet  1 tablet Oral QHS Roney Jaffe, MD      . ondansetron [facility]) tablet 4 mg  4 mg Oral Q6H PRN Roney Jaffe, MD       Or  . ondansetron [facility]) injection 4 mg  4 mg Intravenous Q6H PRN Roney Jaffe, MD      . piperacillin-tazobactam (ZOSYN) IVPB 3.375 g  3.375 g Intravenous Q12H Einar Grad, RPH   3.375 g at 09/11/15 0600  . sodium chloride flush (NS) 0.9 % injection 3 mL  3 mL Intravenous Q12H Roney Jaffe, MD   3 mL at 09/11/15 1201  . sodium chloride flush (  NS) 0.9 % injection 3 mL  3 mL Intravenous PRN Roney Jaffe, MD      . Derrill Memo ON 09/12/2015] vancomycin (VANCOCIN) 500 mg in sodium chloride 0.9 % 100 mL IVPB  500 mg Intravenous Q T,Th,Sa-HD Einar Grad, [facility]       Labs: Basic Metabolic Panel:  Recent Labs Lab 09/07/15 1221  09/10/15 1424 09/10/15 1711 09/10/15 2200 09/11/15 0427  NA 131*  < > 133* 131*  --  132*  K 4.7  < > 3.7 3.6  --  3.6  CL 95*  --   --  94*  --  101  CO2  --   < > 29 28  --  23  GLUCOSE 200*  < > 95 114*  --  73  BUN 49*  < > 11.4 9  --  14  CREATININE 6.40*  < > 1.9* 2.21*  --  2.83*  CALCIUM  --   < > 10.9* 9.9  --  8.7*  PHOS  --   --   --   --  2.2*  --   < > = values in this interval not displayed. Liver Function Tests:  Recent Labs Lab 09/09/15 1344 09/10/15 1424 09/10/15 1711  AST 32 37* 36  ALT _0 ALKPHOS 275* 429* 322*  BILITOT 0.34 0.32 0.3  PROT 6.9 7.1 6.3*  ALBUMIN 1.5* 1.5* 1.6*   CBC:  Recent Labs Lab 09/09/15 1249 09/10/15 1424 09/10/15 1711 09/11/15 0427  WBC 19.1* 20.7* 18.7* 20.7*  NEUTROABS 14.7* 14.2* 12.5*  --   HGB 7.6* 7.6* 6.9* 6.1*  HCT 24.0* 24.4* 22.2* 20.3*  MCV 93.3 95.7 96.9 98.5  PLT 419* 357 349 322   Cardiac Enzymes: No results for input(s): CKTOTAL, CKMB, CKMBINDEX, TROPONINI in the last 168 hours. CBG: No results for input(s): GLUCAP in the last 168 hours. Iron Studies: No results for input(s): IRON, TIBC,  TRANSFERRIN, FERRITIN in the last 72 hours. Studies/Results: Dg Chest 2 View  Result Date: 09/10/2015 CLINICAL DATA:  Fever and weakness. EXAM: CHEST  2 VIEW COMPARISON:  Multiple chest x-rays since July 06, 2015 FINDINGS: No pneumothorax. Bilateral pleural effusions and underlying opacities are identified. A rounded density in the lateral left lower lung measures up to 3.2 cm and is masslike in appearance. However, this was not present on multiple recent chest x-rays. No other nodules or masses. No overt edema. A right dialysis catheter is again identified. The cardiomediastinal silhouette is stable. IMPRESSION: 1. Rounded masslike low-attenuation density in the lateral left lung not seen on multiple recent chest x-rays. This may represent some loculated fluid. Recommend short-term follow-up to ensure resolution. 2. Bilateral pleural effusions with underlying atelectasis. 3. No other acute abnormalities. Electronically Signed   By: Dorise Bullion III M.D   On: 09/10/2015 17:28    ROS: As per HPI otherwise negative.   Physical Exam: Vitals:   09/11/15 1100 09/11/15 1150 09/11/15 1206 09/11/15 1404  BP: (!) 146/91 (!) 152/89 (!) 147/86 (!) 160/90  Pulse: (!) 106 (!) 112 (!) 108 (!) 117  Resp: (!) 21 (!) 25 (!) 21 (!) 30  Temp:  98.7 F (37.1 C) 98.7 F (37.1 C) 99.3 F (37.4 C)  TempSrc:  Oral Oral Oral  SpO2: 100% 97% 97% 98%  Weight:      Height:         General: Well nourished Hispanic male in NAD Head: Normocephalic, atraumatic, sclera non-icteric, mucus membranes are moist Neck:  Supple. JVD not elevated. Lungs: Clear bilaterally to auscultation without wheezes, rales, or rhonchi. Breathing is unlabored. Heart: RRR with S1 S2. No murmurs, rubs, or gallops appreciated. Abdomen: Soft, non-tender, non-distended with normoactive bowel sounds. No rebound/guarding. No obvious abdominal masses. M-S:  Strength and tone appear normal for age. Lower extremities:without edema or ischemic  changes, no open wounds  Neuro: Alert and oriented X 3. Moves all extremities spontaneously. Psych:  Responds to questions appropriately with a normal affect. Dialysis Access: RIJ TDC, LUA AVF Maturing + bruit.   Dialysis Orders: [place] T,Th,S 4 hours 400/800 EDW 58 kg  2.0 K/3.5 Ca No Heparin Aranesp 200 mcg q week (last dose 09/10/15 Last in-center HGB 8.2 09/03/15 Fe 37 Tsat 23 Ferritin 2775 08/27/15) Hectoral: 5 mcg IV q tx q T,Th, S Calcium acetate/TUMS extra strength.   Assessment/Plan: 1.  Fever/Sepsis: WBC 20.7. On Vanc/Zosyn. BC NGX1 day. Per primary.  2.  ESRD -  TTS @ [facility]. For HD tomorrow. K+ 3.6 use 3.0 K/2.5 Ca bath.  3.  Hypertension/volume  - BP elevated. Is on Metoprolol tartrate 50 mg PO BID-will restart. No hydralazine on med list. Last wt 60.6. Has rec'd 4 liters NS and 1 unit of PRBCs. Has been getting to EDW as OP. Will attempt 3-3.5 liters tomorrow.  4.  Anemia  - HGB 6.1. Has rec'd 1 unit of PRBCs-probably needs more. Will check HGB tomorrow prior to HD and transfuse as needed.   5.  Metabolic bone disease -  Ca dropped to 5.2-rechecked-5.3 as OP 08/27/15. Started on 3.5 Ca bath, given TUMS and ^ hectoral. Currently Ca 8.7 C Ca 10.4  Phos 2.2.  Use 2.5 Ca bath. Hold binders/ continue TUMS, decrease dose between meals for now.  6.  Nutrition - Severe PCM. Albumin 1.6. Renal diet when able to eat-currently on cl. Liquid diet-hopefully will advance soon. Prostat/renal vit.  7. IgG Myeloma: Per primary/Dr. [place] oncologist.  8.   New Pleural masses on CT scan: Likely metastasis-to repeat CT in AM.   Rita H. Owens Shark, NP-C 09/11/2015, 2:51 PM  [employer] [phone]  Pt seen, examined and agree w A/P as above.  [name] MD [name] pager [phone]    cell [phone] 09/12/2015, 9:40 AM

## 2015-09-11 NOTE — Progress Notes (Signed)
Pt' s temp has increased to 101.2 and heart rate has increased to 128 sinus rhythm. 650 mg tylenol given and Dr Eliseo Squires notified. Consuelo Pandy RN

## 2015-09-11 NOTE — Progress Notes (Addendum)
PROGRESS NOTE    Charles [name]  HRC:[id number] DOB: 1976/01/09 DOA: 09/10/2015 PCP: [name], MD   Outpatient Specialists:     Brief Narrative:  Charles [name] is a 40 y.o. male with hx of mult myeloma and ESRD on HD TTS who presented to cancer center for chemo today and was sent to ED for fever and tachycardia.  Wife says this started today, yesterday he was "fine".  Has been on about 18 mos of chemoRx for multiple myeloma , not all the same agent.  He started dialysis in May of this year and has a maturing AVF L upper arm.  Reports mild cough for several days, nonproductive.  He got chemo yest (gets chemoRx every Wed and Thurs for 3 wks then has one week off, he is close to finishing his 18 mos of chemo).     Assessment & Plan:   Principal Problem:   Sepsis ([name]) Active Problems:   ESRD (end stage renal disease) on dialysis ([place])   Hypoalbuminemia due to protein-calorie malnutrition (H[facility])   Multiple myeloma not having achieved remission ([facility])   Fever   Anemia   Essential hypertension     Fever with sepsis-  -unknown source -blood cultures pending -Got  4 L IVF in ED- recheck lactic acid in AM   ESRD HD TTS -renal notified -had HD 8/10   Mult myeloma - getting active chemo, last dose 8/9 -Dr. Griffith Citron to see   Anemia - d/t cancer and esrd -some volume dilution -transfuse 1 unit  Malnutrition - alb 1.6  HTN on hydral/ MTP, hold for now   Pleural-based lung mass - may be related to #3 -CT scan of chest in AM to better quantify  Discussed with Dr. Griffith Citron as well as Dr. Jonnie Finner-- will leave in SDU as acutely ill  DVT prophylaxis:  SCD's  Code Status: Full Code   Family Communication: Patient and wife  Disposition Plan:     Consultants:   Renal  oncology  Procedures:         Subjective: Not hurting but says he does not feel well  Objective: Vitals:   09/11/15 0835 09/11/15 1100 09/11/15 1150 09/11/15 1206  BP:  (!) 142/94 (!) 146/91 (!) 152/89 (!) 147/86  Pulse: (!) 102 (!) 106 (!) 112 (!) 108  Resp: 17 (!) 21 (!) 25 (!) 21  Temp: 98.3 F (36.8 C)  98.7 F (37.1 C) 98.7 F (37.1 C)  TempSrc: Oral  Oral Oral  SpO2: 100% 100% 97% 97%  Weight:      Height:        Intake/Output Summary (Last 24 hours) at 09/11/15 1344 Last data filed at 09/11/15 0430  Gross per 24 hour  Intake             2300 ml  Output                0 ml  Net             2300 ml   Filed Weights   09/10/15 1653 09/10/15 2120 09/11/15 0400  Weight: 58.1 kg (128 lb) 62.9 kg (138 lb 10.7 oz) 60.6 kg (133 lb 9.6 oz)    Examination:  General exam: sick  Appearing, flat affect-- say few words only Respiratory system: diminished, no wheezing Cardiovascular system: mildly tachy, no edema Gastrointestinal system: Abdomen is nondistended, soft and nontender. No organomegaly or masses felt. Normal bowel sounds heard. Central nervous system: Alert and oriented. No  focal neurological deficits. Skin: No rashes, lesions or ulcers- port in place     Data Reviewed: I have personally reviewed following labs and imaging studies  CBC:  Recent Labs Lab 09/07/15 1221 09/09/15 1249 09/10/15 1424 09/10/15 1711 09/11/15 0427  WBC  --  19.1* 20.7* 18.7* 20.7*  NEUTROABS  --  14.7* 14.2* 12.5*  --   HGB 8.8* 7.6* 7.6* 6.9* 6.1*  HCT 26.0* 24.0* 24.4* 22.2* 20.3*  MCV  --  93.3 95.7 96.9 98.5  PLT  --  419* 357 349 945   Basic Metabolic Panel:  Recent Labs Lab 09/07/15 1221 09/09/15 1344 09/10/15 1424 09/10/15 1711 09/10/15 2200 09/11/15 0427  NA 131* 132* 133* 131*  --  132*  K 4.7 4.4 3.7 3.6  --  3.6  CL 95*  --   --  94*  --  101  CO2  --  _0 --  23  GLUCOSE 200* 81 95 114*  --  73  BUN 49* 39.5* 11.4 9  --  14  CREATININE 6.40* 4.2* 1.9* 2.21*  --  2.83*  CALCIUM  --  10.5* 10.9* 9.9  --  8.7*  PHOS  --   --   --   --  2.2*  --    GFR: Estimated Creatinine Clearance: 26.8 mL/min (by C-G formula  based on SCr of 2.83 mg/dL). Liver Function Tests:  Recent Labs Lab 09/09/15 1249 09/09/15 1344 09/10/15 1424 09/10/15 1711  AST  --  32 37* 36  ALT  --  _1 ALKPHOS  --  275* 429* 322*  BILITOT  --  0.34 0.32 0.3  PROT 6.0 6.9 7.1 6.3*  ALBUMIN  --  1.5* 1.5* 1.6*   No results for input(s): LIPASE, AMYLASE in the last 168 hours. No results for input(s): AMMONIA in the last 168 hours. Coagulation Profile:  Recent Labs Lab 09/10/15 1711  INR 1.23   Cardiac Enzymes: No results for input(s): CKTOTAL, CKMB, CKMBINDEX, TROPONINI in the last 168 hours. BNP (last 3 results) No results for input(s): PROBNP in the last 8760 hours. HbA1C: No results for input(s): HGBA1C in the last 72 hours. CBG: No results for input(s): GLUCAP in the last 168 hours. Lipid Profile: No results for input(s): CHOL, HDL, LDLCALC, TRIG, CHOLHDL, LDLDIRECT in the last 72 hours. Thyroid Function Tests: No results for input(s): TSH, T4TOTAL, FREET4, T3FREE, THYROIDAB in the last 72 hours. Anemia Panel: No results for input(s): VITAMINB12, FOLATE, FERRITIN, TIBC, IRON, RETICCTPCT in the last 72 hours. Urine analysis:    Component Value Date/Time   COLORURINE RED (A) 08/06/2015 1830   APPEARANCEUR CLOUDY (A) 08/06/2015 1830   LABSPEC 1.012 08/06/2015 1830   LABSPEC 1.030 01/22/2015 1018   PHURINE 8.0 08/06/2015 1830   GLUCOSEU NEGATIVE 08/06/2015 1830   GLUCOSEU Negative 01/22/2015 1018   HGBUR LARGE (A) 08/06/2015 1830   BILIRUBINUR NEGATIVE 08/06/2015 1830   BILIRUBINUR Negative 01/22/2015 1018   KETONESUR NEGATIVE 08/06/2015 1830   PROTEINUR >300 (A) 08/06/2015 1830   UROBILINOGEN 0.2 01/22/2015 1018   NITRITE NEGATIVE 08/06/2015 1830   LEUKOCYTESUR MODERATE (A) 08/06/2015 1830   LEUKOCYTESUR Color Interference due to blood in urine 01/22/2015 1018     ) Recent Results (from the past 240 hour(s))  TECHNOLOGIST REVIEW     Status: None   Collection Time: 09/09/15 12:49 PM  Result  Value Ref Range Status   Technologist Review Rare meta  Final  TECHNOLOGIST REVIEW  Status: None   Collection Time: 09/10/15  2:24 PM  Result Value Ref Range Status   Technologist Review   Final    1% nrbc, Variant lymphs, some appear plasmacytoid, present  Blood Culture (routine x 2)     Status: None (Preliminary result)   Collection Time: 09/10/15  4:40 PM  Result Value Ref Range Status   Specimen Description BLOOD RIGHT ANTECUBITAL  Final   Special Requests BOTTLES DRAWN AEROBIC AND ANAEROBIC 5CC  Final   Culture NO GROWTH < 24 HOURS  Final   Report Status PENDING  Incomplete  Blood Culture (routine x 2)     Status: None (Preliminary result)   Collection Time: 09/10/15  4:52 PM  Result Value Ref Range Status   Specimen Description BLOOD RIGHT HAND  Final   Special Requests BOTTLES DRAWN AEROBIC ONLY 10CC  Final   Culture NO GROWTH < 24 HOURS  Final   Report Status PENDING  Incomplete  MRSA PCR Screening     Status: None   Collection Time: 09/10/15 11:09 PM  Result Value Ref Range Status   MRSA by PCR NEGATIVE NEGATIVE Final    Comment:        The GeneXpert MRSA Assay (FDA approved for NASAL specimens only), is one component of a comprehensive MRSA colonization surveillance program. It is not intended to diagnose MRSA infection nor to guide or monitor treatment for MRSA infections.       Anti-infectives    Start     Dose/Rate Route Frequency Ordered Stop   09/12/15 1200  vancomycin (VANCOCIN) 500 mg in sodium chloride 0.9 % 100 mL IVPB     500 mg 100 mL/hr over 60 Minutes Intravenous Every T-Th-Sa (Hemodialysis) 09/10/15 1959     09/11/15 0600  piperacillin-tazobactam (ZOSYN) IVPB 3.375 g     3.375 g 12.5 mL/hr over 240 Minutes Intravenous Every 12 hours 09/10/15 1959     09/11/15 0530  vancomycin (VANCOCIN) 500 mg in sodium chloride 0.9 % 100 mL IVPB  Status:  Discontinued     500 mg 100 mL/hr over 60 Minutes Intravenous Every 12 hours 09/10/15 1656 09/10/15  1950   09/10/15 2330  piperacillin-tazobactam (ZOSYN) IVPB 3.375 g  Status:  Discontinued     3.375 g 12.5 mL/hr over 240 Minutes Intravenous Every 8 hours 09/10/15 1656 09/10/15 1950   09/10/15 1700  piperacillin-tazobactam (ZOSYN) IVPB 3.375 g  Status:  Discontinued     3.375 g 100 mL/hr over 30 Minutes Intravenous  Once 09/10/15 1656 09/10/15 1701   09/10/15 1700  vancomycin (VANCOCIN) IVPB 1000 mg/200 mL premix     1,000 mg 200 mL/hr over 60 Minutes Intravenous  Once 09/10/15 1656 09/10/15 1801   09/10/15 1645  piperacillin-tazobactam (ZOSYN) IVPB 3.375 g     3.375 g 100 mL/hr over 30 Minutes Intravenous  Once 09/10/15 1637 09/10/15 1721   09/10/15 1645  vancomycin (VANCOCIN) IVPB 1000 mg/200 mL premix  Status:  Discontinued     1,000 mg 200 mL/hr over 60 Minutes Intravenous  Once 09/10/15 1637 09/10/15 1701       Radiology Studies: Dg Chest 2 View  Result Date: 09/10/2015 CLINICAL DATA:  Fever and weakness. EXAM: CHEST  2 VIEW COMPARISON:  Multiple chest x-rays since July 06, 2015 FINDINGS: No pneumothorax. Bilateral pleural effusions and underlying opacities are identified. A rounded density in the lateral left lower lung measures up to 3.2 cm and is masslike in appearance. However, this was not present on multiple  recent chest x-rays. No other nodules or masses. No overt edema. A right dialysis catheter is again identified. The cardiomediastinal silhouette is stable. IMPRESSION: 1. Rounded masslike low-attenuation density in the lateral left lung not seen on multiple recent chest x-rays. This may represent some loculated fluid. Recommend short-term follow-up to ensure resolution. 2. Bilateral pleural effusions with underlying atelectasis. 3. No other acute abnormalities. Electronically Signed   By: Dorise Bullion III M.D   On: 09/10/2015 17:28        Scheduled Meds: . enoxaparin (LOVENOX) injection  30 mg Subcutaneous Q24H  . mirtazapine  15 mg Oral QHS  . multivitamin  1  tablet Oral QHS  . piperacillin-tazobactam (ZOSYN)  IV  3.375 g Intravenous Q12H  . sodium chloride flush  3 mL Intravenous Q12H  . [START ON 09/12/2015] vancomycin  500 mg Intravenous Q T,Th,Sa-HD   Continuous Infusions:    LOS: 1 day    Time spent: 35 min    [place], DO [facility] Pager [phone]  If 7PM-7AM, please contact night-coverage [email or web address] Password [email or web address] 09/11/2015, 1:44 PM

## 2015-09-11 NOTE — Care Management Note (Signed)
Case Management Note  Patient Details  Name: Charles [name] MRN: [id number] Date of Birth: Jun 05, 1975  Subjective/Objective:    Patient lives with spouse, [name]  [phone],  She speaks English, pta indep, has no insurance, no pcp, Informed wife they are to call [facility] 9am Monday  to schedule follow up appt to establish care there.  Patient has transportation as well.  Wife state patient can afford his meds.  He has hemodialyses on Tues , Thur and Sat.  NCM will cont to follow for dc needs. [facility] .  NCM gave wife [facility] brochure.  Wife states they have never had the Match  Before so should be eligible for Match if needed.                Action/Plan:   Expected Discharge Date:  09/14/15               Expected Discharge Plan:  Home/Self Care  In-House Referral:     Discharge planning Services  CM Consult  Post Acute Care Choice:    Choice offered to:     DME Arranged:    DME Agency:     HH Arranged:    HH Agency:     Status of Service:  In process, will continue to follow  If discussed at Long Length of Stay Meetings, dates discussed:    Additional Comments:  Zenon Mayo, RN 09/11/2015, 5:24 PM

## 2015-09-11 NOTE — Progress Notes (Signed)
Urinalysis collected via clean cath. Urine tea colored, no odor noted. Sent to lab. No s/sx of acute distress. Will continue to monitor.

## 2015-09-12 ENCOUNTER — Inpatient Hospital Stay (HOSPITAL_COMMUNITY): Payer: Self-pay

## 2015-09-12 ENCOUNTER — Other Ambulatory Visit: Payer: Self-pay | Admitting: Oncology

## 2015-09-12 DIAGNOSIS — C9000 Multiple myeloma not having achieved remission: Secondary | ICD-10-CM

## 2015-09-12 DIAGNOSIS — K5900 Constipation, unspecified: Secondary | ICD-10-CM

## 2015-09-12 DIAGNOSIS — D649 Anemia, unspecified: Secondary | ICD-10-CM

## 2015-09-12 LAB — CBC
HCT: 21.1 % — ABNORMAL LOW (ref 39.0–52.0)
Hemoglobin: 6.5 g/dL — CL (ref 13.0–17.0)
MCH: 29.3 pg (ref 26.0–34.0)
MCHC: 30.8 g/dL (ref 30.0–36.0)
MCV: 95 fL (ref 78.0–100.0)
PLATELETS: 277 10*3/uL (ref 150–400)
RBC: 2.22 MIL/uL — AB (ref 4.22–5.81)
RDW: 19.8 % — ABNORMAL HIGH (ref 11.5–15.5)
WBC: 18 10*3/uL — ABNORMAL HIGH (ref 4.0–10.5)

## 2015-09-12 LAB — RENAL FUNCTION PANEL
Albumin: 1.2 g/dL — ABNORMAL LOW (ref 3.5–5.0)
Anion gap: 8 (ref 5–15)
BUN: 24 mg/dL — ABNORMAL HIGH (ref 6–20)
CO2: 22 mmol/L (ref 22–32)
Calcium: 8.1 mg/dL — ABNORMAL LOW (ref 8.9–10.3)
Chloride: 101 mmol/L (ref 101–111)
Creatinine, Ser: 4.45 mg/dL — ABNORMAL HIGH (ref 0.61–1.24)
GFR calc Af Amer: 18 mL/min — ABNORMAL LOW (ref >60)
GFR calc non Af Amer: 15 mL/min — ABNORMAL LOW (ref >60)
Glucose, Bld: 82 mg/dL (ref 65–99)
Phosphorus: 3.6 mg/dL (ref 2.5–4.6)
Potassium: 3.7 mmol/L (ref 3.5–5.1)
Sodium: 131 mmol/L — ABNORMAL LOW (ref 135–145)

## 2015-09-12 LAB — LACTIC ACID, PLASMA: LACTIC ACID, VENOUS: 1.4 mmol/L (ref 0.5–1.9)

## 2015-09-12 LAB — VANCOMYCIN, RANDOM: VANCOMYCIN RM: 25

## 2015-09-12 LAB — HEMOGLOBIN AND HEMATOCRIT, BLOOD
HEMATOCRIT: 29.9 % — AB (ref 39.0–52.0)
Hemoglobin: 9.9 g/dL — ABNORMAL LOW (ref 13.0–17.0)

## 2015-09-12 LAB — PATHOLOGIST SMEAR REVIEW

## 2015-09-12 LAB — PREPARE RBC (CROSSMATCH)

## 2015-09-12 MED ORDER — LIDOCAINE-PRILOCAINE 2.5-2.5 % EX CREA: 1.0000 "application " | TOPICAL_CREAM | CUTANEOUS | Status: DC | PRN

## 2015-09-12 MED ORDER — VALACYCLOVIR HCL 500 MG PO TABS
500.0000 mg | ORAL_TABLET | Freq: Every day | ORAL | Status: DC
  Administered 2015-09-12 – 2015-09-18 (×7): 500 mg via ORAL
  Filled 2015-09-12 (×7): qty 1

## 2015-09-12 MED ORDER — ALTEPLASE 2 MG IJ SOLR: 2.0000 mg | Freq: Once | INTRAMUSCULAR | Status: DC | PRN

## 2015-09-12 MED ORDER — HEPARIN SODIUM (PORCINE) 1000 UNIT/ML DIALYSIS: 1000.0000 [IU] | INTRAMUSCULAR | Status: DC | PRN

## 2015-09-12 MED ORDER — SODIUM CHLORIDE 0.9 % IV SOLN: Freq: Once | INTRAVENOUS | Status: DC

## 2015-09-12 MED ORDER — PROCHLORPERAZINE MALEATE 10 MG PO TABS: 10.0000 mg | ORAL_TABLET | Freq: Four times a day (QID) | ORAL | 1 refills | Status: DC | PRN

## 2015-09-12 MED ORDER — VANCOMYCIN HCL IN DEXTROSE 750-5 MG/150ML-% IV SOLN
INTRAVENOUS | Status: AC
  Administered 2015-09-12: 750 mg
  Filled 2015-09-12: qty 150

## 2015-09-12 MED ORDER — ACETAMINOPHEN 500 MG PO TABS
1000.0000 mg | ORAL_TABLET | Freq: Two times a day (BID) | ORAL | Status: DC
  Administered 2015-09-12 – 2015-09-18 (×9): 1000 mg via ORAL
  Filled 2015-09-12 (×10): qty 2

## 2015-09-12 MED ORDER — MORPHINE SULFATE (PF) 2 MG/ML IV SOLN: 1.0000 mg | INTRAVENOUS | Status: DC | PRN

## 2015-09-12 MED ORDER — LIDOCAINE HCL (PF) 1 % IJ SOLN: 5.0000 mL | INTRAMUSCULAR | Status: DC | PRN

## 2015-09-12 MED ORDER — SODIUM CHLORIDE 0.9 % IV SOLN: 100.0000 mL | INTRAVENOUS | Status: DC | PRN

## 2015-09-12 MED ORDER — VANCOMYCIN HCL IN DEXTROSE 750-5 MG/150ML-% IV SOLN
750.0000 mg | INTRAVENOUS | Status: DC
  Filled 2015-09-12: qty 150

## 2015-09-12 MED ORDER — POLYETHYLENE GLYCOL 3350 17 G PO PACK
17.0000 g | PACK | Freq: Every day | ORAL | Status: DC
  Administered 2015-09-14 – 2015-09-18 (×5): 17 g via ORAL
  Filled 2015-09-12 (×5): qty 1

## 2015-09-12 MED ORDER — TRAMADOL HCL 50 MG PO TABS
50.0000 mg | ORAL_TABLET | Freq: Two times a day (BID) | ORAL | Status: DC
  Administered 2015-09-12 – 2015-09-18 (×12): 50 mg via ORAL
  Filled 2015-09-12 (×14): qty 1

## 2015-09-12 MED ORDER — DEXAMETHASONE 4 MG PO TABS: 4.0000 mg | ORAL_TABLET | Freq: Every day | ORAL | 4 refills | Status: DC

## 2015-09-12 MED ORDER — PENTAFLUOROPROP-TETRAFLUOROETH EX AERO: 1.0000 "application " | INHALATION_SPRAY | CUTANEOUS | Status: DC | PRN

## 2015-09-12 MED ORDER — DOXERCALCIFEROL 4 MCG/2ML IV SOLN
INTRAVENOUS | Status: AC
  Filled 2015-09-12: qty 4

## 2015-09-12 NOTE — Progress Notes (Signed)
Charles [name] Progress Note   Subjective: no c/o, no pain or cough, still spiking temps and WBC high  Vitals:   09/12/15 0815 09/12/15 0844 09/12/15 0910 09/12/15 0930  BP: 133/86 (!) 141/87 138/82 138/83  Pulse: 89 95 97 100  Resp:      Temp:      TempSrc:      SpO2:      Weight:      Height:        Inpatient medications: . sodium chloride   Intravenous Once  . calcium carbonate  1 tablet Oral TID  . doxercalciferol  5 mcg Intravenous Q T,Th,Sa-HD  . enoxaparin (LOVENOX) injection  30 mg Subcutaneous Q24H  . metoprolol tartrate  50 mg Oral BID  . mirtazapine  15 mg Oral QHS  . multivitamin  1 tablet Oral QHS  . piperacillin-tazobactam (ZOSYN)  IV  3.375 g Intravenous Q12H  . sodium chloride flush  3 mL Intravenous Q12H  . vancomycin  750 mg Intravenous Q T,Th,Sa-HD     sodium chloride, sodium chloride, sodium chloride, acetaminophen **OR** acetaminophen, alteplase, heparin, HYDROmorphone (DILAUDID) injection, lidocaine (PF), lidocaine-prilocaine, LORazepam, ondansetron **OR** ondansetron (ZOFRAN) IV, pentafluoroprop-tetrafluoroeth, sodium chloride flush  Exam: Withdrawn as usual, no distress, calm No jvd Chest clear bilat RRR no mrg Abd soft ntnd +bs Ext no edema or wounds LUA AVF +bruit/ RIJ TDC NF, Ox 3  CXR 8/10 tiny effusions, no edema, both diaphragms visible, new rounded mass L lower lung 3.2 cm  Dialysis: [facility]  4h   58kg  2/3.5 Ca bath  Hep none Aranesp 200/wk last 8/10, Hect 5 ug Hb 8.2, tsat 23  ferr 2775      Assessment: 1  Fevers - unclear source. Had "tumor" fevers on prior admits.  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 (given for skeletal protection to myeloma pts), on Tums/ Hect/ high Ca bath 6  Malnutrition 7  Volume is 4-5 kg up by wts  Plan - HD today, UF 3 L, give prbc's   [name]  MD [name] pager [phone]    cell [phone] 09/12/2015, 9:40 AM    Recent Labs Lab 09/10/15 1711 09/10/15 2200 09/11/15 0427 09/12/15 0816  NA 131*  --  132* 131*  K 3.6  --  3.6 3.7  CL 94*  --  101 101  CO2 28  --  23 22  GLUCOSE 114*  --  73 82  BUN 9  --  14 24*  CREATININE 2.21*  --  2.83* 4.45*  CALCIUM 9.9  --  8.7* 8.1*  PHOS  --  2.2*  --  3.6    Recent Labs Lab 09/09/15 1344 09/10/15 1424 09/10/15 1711 09/12/15 0816  AST 32 37* 36  --   ALT '17 23 23  '$ --   ALKPHOS 275* 429* 322*  --   BILITOT 0.34 0.32 0.3  --   PROT 6.9 7.1 6.3*  --   ALBUMIN 1.5* 1.5* 1.6* 1.2*    Recent Labs Lab 09/09/15 1249 09/10/15 1424 09/10/15 1711 09/11/15 0427 09/12/15 0815  WBC 19.1* 20.7* 18.7* 20.7* 18.0*  NEUTROABS 14.7* 14.2* 12.5*  --   --   HGB 7.6* 7.6* 6.9* 6.1* 6.5*  HCT 24.0* 24.4* 22.2* 20.3* 21.1*  MCV 93.3 95.7 96.9 98.5  95.0  PLT 419* 357 349 322 277   Iron/TIBC/Ferritin/ %Sat    Component Value Date/Time   IRON 19 (L) 08/08/2015 1850   TIBC 118 (L) 08/08/2015 1850   FERRITIN 2,104 Result Confirmed by Automated Dilution. (H) 08/12/2015 1443   IRONPCTSAT 16 (L) 08/08/2015 1850

## 2015-09-12 NOTE — Progress Notes (Signed)
Charles [name]   DOB:04/23/1975   WH#:[id number]   YKZ#:[id number]  Subjective: Charles [name] is as usual not complaining. Things have been "OK". He says he did not feel different despite the fever. Generally he feels very tired, especially on HD days. He denies unusual h/a, N/V, cough, phlegm production or hemoptysis. Does have some pleuritic pain with deep breathing. On direct questioning admits to pain. At home he takes tramadol once a day "and that's enough." He is constipated. No family at bedise   Objective: (relatively) young Charles [name] examined at bedside during HD Vitals:   09/12/15 1000 09/12/15 1030  BP: 138/86 (!) 155/89  Pulse: (!) 101 (!) 101  Resp:    Temp:      Body mass index is 25.4 kg/m.  Intake/Output Summary (Last 24 hours) at 09/12/15 1039 Last data filed at 09/12/15 0300  Gross per 24 hour  Intake              359 ml  Output              150 ml  Net              209 ml     Sclerae unicteric  No cervical or supraclavicular adenopathy  Lungs no rales or wheezes--auscultated anterolaterally  Heart regular rate and rhythm  Abdomen soft, +BS  Neuro nonfocal   CBG (last 3)  No results for input(s): GLUCAP in the last 72 hours.   Labs:  Lab Results  Component Value Date   WBC 18.0 (H) 09/12/2015   HGB 6.5 (LL) 09/12/2015   HCT 21.1 (L) 09/12/2015   MCV 95.0 09/12/2015   PLT 277 09/12/2015   NEUTROABS 12.5 (H) 09/10/2015    @LASTCHEMISTRY @  Urine Studies No results for input(s): UHGB, CRYS in the last 72 hours.  Invalid input(s): UACOL, UAPR, USPG, UPH, UTP, UGL, [place], UBIL, UNIT, UROB, [place], UEPI, UWBC, Charles [name], Charles [name], Charles [name]  Basic Metabolic Panel:  Recent Labs Lab 09/07/15 1221  09/09/15 1344 09/10/15 1424 09/10/15 1711 09/10/15 2200 09/11/15 0427 09/12/15 0816  NA 131*  --  132* 133* 131*  --  132* 131*  K 4.7  < > 4.4 3.7 3.6  --  3.6 3.7  CL 95*  --   --   --  94*  --  101 101  CO2  --   --  25 29 28   --  23 22  GLUCOSE  200*  --  81 95 114*  --  73 82  BUN 49*  --  39.5* 11.4 9  --  14 24*  CREATININE 6.40*  --  4.2* 1.9* 2.21*  --  2.83* 4.45*  CALCIUM  --   --  10.5* 10.9* 9.9  --  8.7* 8.1*  PHOS  --   --   --   --   --  2.2*  --  3.6  < > = values in this interval not displayed. GFR Estimated Creatinine Clearance: 17 mL/min (by C-G formula based on SCr of 4.45 mg/dL). Liver Function Tests:  Recent Labs Lab 09/09/15 1249 09/09/15 1344 09/10/15 1424 09/10/15 1711 09/12/15 0816  AST  --  32 37* 36  --   ALT  --  17 23 23   --   ALKPHOS  --  275* 429* 322*  --   BILITOT  --  0.34 0.32 0.3  --   PROT 6.0 6.9 7.1 6.3*  --   ALBUMIN  --  1.5* 1.5* 1.6* 1.2*   No results for input(s): LIPASE, AMYLASE in the last 168 hours. No results for input(s): AMMONIA in the last 168 hours. Coagulation profile  Recent Labs Lab 09/10/15 1711  INR 1.23    CBC:  Recent Labs Lab 09/09/15 1249 09/10/15 1424 09/10/15 1711 09/11/15 0427 09/12/15 0815  WBC 19.1* 20.7* 18.7* 20.7* 18.0*  NEUTROABS 14.7* 14.2* 12.5*  --   --   HGB 7.6* 7.6* 6.9* 6.1* 6.5*  HCT 24.0* 24.4* 22.2* 20.3* 21.1*  MCV 93.3 95.7 96.9 98.5 95.0  PLT 419* 357 349 322 277   Cardiac Enzymes: No results for input(s): CKTOTAL, CKMB, CKMBINDEX, TROPONINI in the last 168 hours. BNP: Invalid input(s): POCBNP CBG: No results for input(s): GLUCAP in the last 168 hours. D-Dimer No results for input(s): DDIMER in the last 72 hours. Hgb A1c No results for input(s): HGBA1C in the last 72 hours. Lipid Profile No results for input(s): CHOL, HDL, LDLCALC, TRIG, CHOLHDL, LDLDIRECT in the last 72 hours. Thyroid function studies No results for input(s): TSH, T4TOTAL, T3FREE, THYROIDAB in the last 72 hours.  Invalid input(s): FREET3 Anemia work up No results for input(s): VITAMINB12, FOLATE, FERRITIN, TIBC, IRON, RETICCTPCT in the last 72 hours. Microbiology Recent Results (from the past 240 hour(s))  TECHNOLOGIST REVIEW     Status:  None   Collection Time: 09/09/15 12:49 PM  Result Value Ref Range Status   Technologist Review Rare meta  Final  TECHNOLOGIST REVIEW     Status: None   Collection Time: 09/10/15  2:24 PM  Result Value Ref Range Status   Technologist Review   Final    1% nrbc, Variant lymphs, some appear plasmacytoid, present  Blood Culture (routine x 2)     Status: None (Preliminary result)   Collection Time: 09/10/15  4:40 PM  Result Value Ref Range Status   Specimen Description BLOOD RIGHT ANTECUBITAL  Final   Special Requests BOTTLES DRAWN AEROBIC AND ANAEROBIC 5CC  Final   Culture NO GROWTH < 24 HOURS  Final   Report Status PENDING  Incomplete  Blood Culture (routine x 2)     Status: None (Preliminary result)   Collection Time: 09/10/15  4:52 PM  Result Value Ref Range Status   Specimen Description BLOOD RIGHT HAND  Final   Special Requests BOTTLES DRAWN AEROBIC ONLY 10CC  Final   Culture NO GROWTH < 24 HOURS  Final   Report Status PENDING  Incomplete  MRSA PCR Screening     Status: None   Collection Time: 09/10/15 11:09 PM  Result Value Ref Range Status   MRSA by PCR NEGATIVE NEGATIVE Final    Comment:        The GeneXpert MRSA Assay (FDA approved for NASAL specimens only), is one component of a comprehensive MRSA colonization surveillance program. It is not intended to diagnose MRSA infection nor to guide or monitor treatment for MRSA infections.    Results for Charles [name] (MRN [id number]) as of 09/12/2015 10:54  Ref. Range 10/15/2014 08:12 12/12/2014 15:06 01/20/2015 09:52 02/12/2015 14:33 03/12/2015 12:53 04/09/2015 15:34 05/14/2015 13:25 06/11/2015 10:10 07/15/2015 11:05 08/12/2015 13:20 09/09/2015 12:49  IgG (Immunoglobin G), Serum Latest Ref Range: 700 - 1600 mg/dL 1,170 1,210 1,680 (H) 701 1,209 1,327 2,018 (H) 1,928 (H) 433 (L) 1,588 1,452   Results for Charles [name] (MRN [id number]) as of 09/12/2015 10:54  Ref. Range 05/12/2014 13:37 06/02/2014 14:34 06/23/2014 14:47 07/25/2014  15:50 08/22/2014 15:36 09/19/2014 15:09 10/15/2014 08:12 11/14/2014  15:27 12/12/2014 15:06 01/01/2015 12:03 01/20/2015 09:52  Kappa:Lambda Ratio Latest Ref Range: 0.26 - 1.65  1.74 (H) 1.24 1.25 1.63 1.63 1.54 1.72 (H) 1.61 2.10 (H) 3.10 (H) 4.41 (H)  Results for Charles [name] (MRN [id number]) as of 09/12/2015 10:54  Ref. Range 02/12/2015 14:33 03/12/2015 12:53 04/09/2015 15:34 05/14/2015 13:25 06/11/2015 10:10 07/15/2015 11:05 08/12/2015 13:20 09/09/2015 12:49  M Protein SerPl Elph-Mcnc Latest Ref Range: Not Observed g/dL 0.5 (H) 1.1 (H) 1.2 (H) 1.8 (H) 1.7 (H) 0.3 (H) 1.4 (H) 1.4 (H)    Studies:  Dg Chest 2 View  Result Date: 09/10/2015 CLINICAL DATA:  Fever and weakness. EXAM: CHEST  2 VIEW COMPARISON:  Multiple chest x-rays since July 06, 2015 FINDINGS: No pneumothorax. Bilateral pleural effusions and underlying opacities are identified. A rounded density in the lateral left lower lung measures up to 3.2 cm and is masslike in appearance. However, this was not present on multiple recent chest x-rays. No other nodules or masses. No overt edema. A right dialysis catheter is again identified. The cardiomediastinal silhouette is stable. IMPRESSION: 1. Rounded masslike low-attenuation density in the lateral left lung not seen on multiple recent chest x-rays. This may represent some loculated fluid. Recommend short-term follow-up to ensure resolution. 2. Bilateral pleural effusions with underlying atelectasis. 3. No other acute abnormalities. Electronically Signed   By: Dorise Bullion III M.D   On: 09/10/2015 17:28   Ct Chest Wo Contrast  Result Date: 09/12/2015 CLINICAL DATA:  Evaluate for mass within the chest. EXAM: CT CHEST WITHOUT CONTRAST TECHNIQUE: Multidetector CT imaging of the chest was performed following the standard protocol without IV contrast. COMPARISON:  Chest radiograph 09/10/2015; chest CT 06/30/2015 FINDINGS: Cardiovascular: Heart is enlarged. Aorta and main pulmonary artery normal in caliber.  Central venous catheter tip terminates at the superior cavoatrial junction. Mediastinum/Nodes: No axillary, mediastinal or hilar lymphadenopathy. Mild heterogeneity of the thyroid, unchanged. Esophagus is unremarkable. Lungs/Pleura: Central airways are patent. Subpleural ground-glass and consolidative opacities within the right and left lower lobes. Moderate layering bilateral pleural effusions, increased from prior. No pneumothorax. Motion artifact limits evaluation. There are multiple pleural-based masses demonstrated within both the left and right hemi thorax. A few of these have increased in size measuring up to 2.4 x 1.2 cm within the anterior left hemi thorax (image 66; series 2) and up to 1.3 x 2.7 cm in the lateral left hemi thorax (image 74; series 2). These are associated with underlying osseous destruction of the adjacent ribs including the anterior left second rib, posterior left third rib, the lateral left fifth rib and posterior seventh and ninth ribs. Upper Abdomen: Unremarkable Musculoskeletal: Re- demonstrated lytic lesions within the sternum. Additionally there multiple osseous destructive lesions involving predominantly the left ribs as described above associated with soft tissue masses, most compatible patient's history of multiple myeloma. There are few patchy lucencies throughout the thoracic spine, most notable within the left T4 transverse process. IMPRESSION: Interval increase in size of multiple predominately left-sided pleural based soft tissue masses with associated underlying osseous destruction, most compatible patient's history of multiple myeloma. Persistent lytic lesion within the sternum/manubrium. Moderate layering bilateral pleural effusions, increased from prior, with underlying pulmonary consolidation most compatible with associated atelectasis. Electronically Signed   By: Lovey Newcomer M.D.   On: 09/12/2015 07:56    Assessment: 40 y.o. [place] [name] presenting January 2016  with bony pain, multiple lytic lesions, and monoclonal IgG kappa paraprotein in serum (2.22 g/dL) and urine (145 mg/dL), bone marrow biopsy 02/03/2014  showing an average 12% plasmacytosis, with some sheets of abnormal plasma cells noted, cytogenetics pending; baseline beta 2 microglobulin was 7.36, baseline creatinine 1.64, with GFR 51, calcium on general 05/21/2014 was 10.8, LDH was normal  (1) Multiple myeloma stage IIA diagnosed January 2016;        I: CyBorD started January 2016                       (a) dexamethasone 20 mg/d every TU/WED started 02/04/2014                       (b) bortezomib sQ days 1,4,8,11 of every 21 day cycle started 02/10/2014                       (c) cyclophosphamide 300 mg/M2 IV weekly started 02/13/2014       2: Lenalidomide 84m daily started 07/26/2014, discontinued December 2016 with progression  (2) 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 The patient is not a transplant candidate for financial reasons  (6) bilateral foot rash: Started on Septra DS and Diflucan 09/03/2014, resolved  (7) Multiple Myeloma relapse in December 2016.                        (a) starting 02/05/15: dexamethasone 225mx2 consecutive days weekly; bortezomib sQ weekly,                        (b) Septra DS twice daily                       (c) acyclovir 40068maily                        (d) bortezomib/ dexamethasone discontinued after 05/06/2015 dose with evidence of progression  (8) started  carfilzomib 05/28/2015, given with weekly low dose dexamethasone                       (a) tolerated cyclophosphamide poorly, discontinued after cycle 1                       (b) carfilzomib started 05/28/2015                                             (  i) cycle 2 started 07/01/2015                                             (ii) cycle 3 delayed by 1 week due to hospitalization. Started on 08/13/2015.                                             (iii) cycle 4 to start 09/09/2015                       (c) taking dexamethasone 20 mg every Sunday and Monday                       (d) lenalidomide discontinued 07/29/2015  (9) on hemodialysis Tu/Th/Sa as of early June 2017  (10) adjuvant radiation completed 07/27/2015     Plan: I am not sure what the cause of Charles [name]'s fever is--possibly it could be tumor fever, but he is immunocompromised and there could be more than one cause. He is on day 3  broad-spectrum antibiotics and should be on lon-term viral prophylaxis (not sure what he has been on at home-- will add valtrex, which should be continued at discharge)  If the temperature persists despite your current treatment would consider ID consultation  He has been on carfilzomib/lenalidomide/dexamethasone nearly 4 months--and though the SPEP/light chains suggest stable disease, the CT chest today is c/w disease progression. Accordingly as far as his myeloma treatment is concerned we will likely d/c the current therapy and switch to daratumumab-- this will be done as outpatient  He is a very stoical and taciturn patient and never asks for pain medication. His pain is generally well-controlled on minimal doses of tramadol. He is mildly constipated. Will write for tramadol BID and bowel prophylaxis.  His severe anemia is being addressed through transfusion today and of course he received epo at HD regularly. He has a very elevated ferritin.  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 In fact patient's calcium dropped from 8.6 to 6.1 after his most recent dose (08/13/2015). Will continue denosumab monthly but hold the dose for calcium levels <9.5 in future.  He already has an appointment at the [facility] 09/16/2015. Please let me know if I can be of further help at this point.   [name], MD 09/12/2015  10:39 AM Medical Oncology and Hematology [facility] [address] [place], [place] [address] Tel. [phone]    Fax. [phone]

## 2015-09-12 NOTE — Progress Notes (Signed)
PROGRESS NOTE    Charles [name]  UYQ:[id number] DOB: 03-Jun-1975 DOA: 09/10/2015 PCP: [name], MD   Outpatient Specialists:  Dr. Griffith Citron   Brief Narrative:  Charles [name] is a 40 y.o. male with hx of mult myeloma and ESRD on HD TTS who presented to cancer center for chemo today and was sent to ED for fever and tachycardia.  Wife says this started today, yesterday he was "fine".  Has been on about 18 mos of chemoRx for multiple myeloma , not all the same agent.  He started dialysis in May of this year and has a maturing AVF L upper arm.  Reports mild cough for several days, nonproductive.  He got chemo yest (gets chemoRx every Wed and Thurs for 3 wks then has one week off, he is close to finishing his 18 mos of chemo).     Assessment & Plan:   Principal Problem:   Sepsis ([place]) Active Problems:   ESRD (end stage renal disease) on dialysis (HCC)   Hypoalbuminemia due to protein-calorie malnutrition (HCC)   Multiple myeloma not having achieved remission (HCC)   Fever   Anemia   Essential hypertension     Fever with sepsis-  -unknown source -blood cultures pending -if continues to have fever will get ID consult    ESRD HD TTS -renal notified -had HD 8/10 and again 8/12   Mult myeloma - getting active chemo, last dose 8/9 -Dr. Griffith Citron saw   Anemia - d/t cancer and esrd -some volume dilution -transfused 1 unit- check H/H after dialysis  Malnutrition - alb 1.6  HTN on hydral/ MTP, hold for now   Pleural-based lung mass - may be related to #3 -CT scan of chest shows progression of his disease    DVT prophylaxis:  SCD's D/c'd lovenox  Code Status: Full Code   Family Communication: Patient   Disposition Plan:     Consultants:   Renal  oncology  Procedures:         Subjective: Appears to be uncomfortable but denies pain  Objective: Vitals:   09/12/15 1130 09/12/15 1145 09/12/15 1207 09/12/15 1248  BP: (!) 163/99 (!)  154/96 (!) 163/97 (!) 148/97  Pulse: (!) 109 (!) 112 (!) 112   Resp: (!) 23  (!) 21   Temp: 98.1 F (36.7 C)  98.5 F (36.9 C) 98.4 F (36.9 C)  TempSrc: Oral  Oral Oral  SpO2: 100%  100%   Weight:   60.5 kg (133 lb 6.1 oz)   Height:        Intake/Output Summary (Last 24 hours) at 09/12/15 1334 Last data filed at 09/12/15 1207  Gross per 24 hour  Intake              409 ml  Output             3076 ml  Net            -2667 ml   Filed Weights   09/12/15 0500 09/12/15 0800 09/12/15 1207  Weight: 62.5 kg (137 lb 12.6 oz) 63 kg (138 lb 14.2 oz) 60.5 kg (133 lb 6.1 oz)    Examination:  General exam: sick  Appearing, flat affect-- say few words only Respiratory system: diminished, no wheezing Cardiovascular system: mildly tachy, no edema Gastrointestinal system: Abdomen is nondistended, soft and nontender. No organomegaly or masses felt. Normal bowel sounds heard. Central nervous system: Alert and oriented. No focal neurological deficits. Skin: No rashes, lesions or ulcers-  port in place     Data Reviewed: I have personally reviewed following labs and imaging studies  CBC:  Recent Labs Lab 09/09/15 1249 09/10/15 1424 09/10/15 1711 09/11/15 0427 09/12/15 0815  WBC 19.1* 20.7* 18.7* 20.7* 18.0*  NEUTROABS 14.7* 14.2* 12.5*  --   --   HGB 7.6* 7.6* 6.9* 6.1* 6.5*  HCT 24.0* 24.4* 22.2* 20.3* 21.1*  MCV 93.3 95.7 96.9 98.5 95.0  PLT 419* 357 349 322 389   Basic Metabolic Panel:  Recent Labs Lab 09/07/15 1221 09/09/15 1344 09/10/15 1424 09/10/15 1711 09/10/15 2200 09/11/15 0427 09/12/15 0816  NA 131* 132* 133* 131*  --  132* 131*  K 4.7 4.4 3.7 3.6  --  3.6 3.7  CL 95*  --   --  94*  --  101 101  CO2  --  25 29 28   --  23 22  GLUCOSE 200* 81 95 114*  --  73 82  BUN 49* 39.5* 11.4 9  --  14 24*  CREATININE 6.40* 4.2* 1.9* 2.21*  --  2.83* 4.45*  CALCIUM  --  10.5* 10.9* 9.9  --  8.7* 8.1*  PHOS  --   --   --   --  2.2*  --  3.6   GFR: Estimated  Creatinine Clearance: 17 mL/min (by C-G formula based on SCr of 4.45 mg/dL). Liver Function Tests:  Recent Labs Lab 09/09/15 1249 09/09/15 1344 09/10/15 1424 09/10/15 1711 09/12/15 0816  AST  --  32 37* 36  --   ALT  --  17 23 23   --   ALKPHOS  --  275* 429* 322*  --   BILITOT  --  0.34 0.32 0.3  --   PROT 6.0 6.9 7.1 6.3*  --   ALBUMIN  --  1.5* 1.5* 1.6* 1.2*   No results for input(s): LIPASE, AMYLASE in the last 168 hours. No results for input(s): AMMONIA in the last 168 hours. Coagulation Profile:  Recent Labs Lab 09/10/15 1711  INR 1.23   Cardiac Enzymes: No results for input(s): CKTOTAL, CKMB, CKMBINDEX, TROPONINI in the last 168 hours. BNP (last 3 results) No results for input(s): PROBNP in the last 8760 hours. HbA1C: No results for input(s): HGBA1C in the last 72 hours. CBG: No results for input(s): GLUCAP in the last 168 hours. Lipid Profile: No results for input(s): CHOL, HDL, LDLCALC, TRIG, CHOLHDL, LDLDIRECT in the last 72 hours. Thyroid Function Tests: No results for input(s): TSH, T4TOTAL, FREET4, T3FREE, THYROIDAB in the last 72 hours. Anemia Panel: No results for input(s): VITAMINB12, FOLATE, FERRITIN, TIBC, IRON, RETICCTPCT in the last 72 hours. Urine analysis:    Component Value Date/Time   COLORURINE BROWN (A) 09/11/2015 2252   APPEARANCEUR TURBID (A) 09/11/2015 2252   LABSPEC 1.016 09/11/2015 2252   LABSPEC 1.030 01/22/2015 1018   PHURINE 7.5 09/11/2015 2252   GLUCOSEU 100 (A) 09/11/2015 2252   GLUCOSEU Negative 01/22/2015 1018   HGBUR LARGE (A) 09/11/2015 2252   BILIRUBINUR MODERATE (A) 09/11/2015 2252   BILIRUBINUR Negative 01/22/2015 1018   KETONESUR 15 (A) 09/11/2015 2252   PROTEINUR >300 (A) 09/11/2015 2252   UROBILINOGEN 0.2 01/22/2015 1018   NITRITE NEGATIVE 09/11/2015 2252   LEUKOCYTESUR MODERATE (A) 09/11/2015 2252   LEUKOCYTESUR Color Interference due to blood in urine 01/22/2015 1018     ) Recent Results (from the past 240  hour(s))  TECHNOLOGIST REVIEW     Status: None   Collection Time: 09/09/15 12:49 PM  Result Value Ref Range Status   Technologist Review Rare meta  Final  TECHNOLOGIST REVIEW     Status: None   Collection Time: 09/10/15  2:24 PM  Result Value Ref Range Status   Technologist Review   Final    1% nrbc, Variant lymphs, some appear plasmacytoid, present  Blood Culture (routine x 2)     Status: None (Preliminary result)   Collection Time: 09/10/15  4:40 PM  Result Value Ref Range Status   Specimen Description BLOOD RIGHT ANTECUBITAL  Final   Special Requests BOTTLES DRAWN AEROBIC AND ANAEROBIC 5CC  Final   Culture NO GROWTH < 24 HOURS  Final   Report Status PENDING  Incomplete  Blood Culture (routine x 2)     Status: None (Preliminary result)   Collection Time: 09/10/15  4:52 PM  Result Value Ref Range Status   Specimen Description BLOOD RIGHT HAND  Final   Special Requests BOTTLES DRAWN AEROBIC ONLY 10CC  Final   Culture NO GROWTH < 24 HOURS  Final   Report Status PENDING  Incomplete  MRSA PCR Screening     Status: None   Collection Time: 09/10/15 11:09 PM  Result Value Ref Range Status   MRSA by PCR NEGATIVE NEGATIVE Final    Comment:        The GeneXpert MRSA Assay (FDA approved for NASAL specimens only), is one component of a comprehensive MRSA colonization surveillance program. It is not intended to diagnose MRSA infection nor to guide or monitor treatment for MRSA infections.       Anti-infectives    Start     Dose/Rate Route Frequency Ordered Stop   09/12/15 1400  valACYclovir (VALTREX) tablet 500 mg     500 mg Oral Daily 09/12/15 1104     09/12/15 1200  vancomycin (VANCOCIN) 500 mg in sodium chloride 0.9 % 100 mL IVPB  Status:  Discontinued     500 mg 100 mL/hr over 60 Minutes Intravenous Every T-Th-Sa (Hemodialysis) 09/10/15 1959 09/12/15 0806   09/12/15 1200  vancomycin (VANCOCIN) IVPB 750 mg/150 ml premix     750 mg 150 mL/hr over 60 Minutes Intravenous Every  T-Th-Sa (Hemodialysis) 09/12/15 0806     09/12/15 1017  Vancomycin (VANCOCIN) 750-5 MG/150ML-% IVPB    Comments:  [name]   : cabinet override      09/12/15 1017 09/12/15 1032   09/11/15 0600  piperacillin-tazobactam (ZOSYN) IVPB 3.375 g     3.375 g 12.5 mL/hr over 240 Minutes Intravenous Every 12 hours 09/10/15 1959     09/11/15 0530  vancomycin (VANCOCIN) 500 mg in sodium chloride 0.9 % 100 mL IVPB  Status:  Discontinued     500 mg 100 mL/hr over 60 Minutes Intravenous Every 12 hours 09/10/15 1656 09/10/15 1950   09/10/15 2330  piperacillin-tazobactam (ZOSYN) IVPB 3.375 g  Status:  Discontinued     3.375 g 12.5 mL/hr over 240 Minutes Intravenous Every 8 hours 09/10/15 1656 09/10/15 1950   09/10/15 1700  piperacillin-tazobactam (ZOSYN) IVPB 3.375 g  Status:  Discontinued     3.375 g 100 mL/hr over 30 Minutes Intravenous  Once 09/10/15 1656 09/10/15 1701   09/10/15 1700  vancomycin (VANCOCIN) IVPB 1000 mg/200 mL premix     1,000 mg 200 mL/hr over 60 Minutes Intravenous  Once 09/10/15 1656 09/10/15 1801   09/10/15 1645  piperacillin-tazobactam (ZOSYN) IVPB 3.375 g     3.375 g 100 mL/hr over 30 Minutes Intravenous  Once 09/10/15 1637 09/10/15  1721   09/10/15 1645  vancomycin (VANCOCIN) IVPB 1000 mg/200 mL premix  Status:  Discontinued     1,000 mg 200 mL/hr over 60 Minutes Intravenous  Once 09/10/15 1637 09/10/15 1701       Radiology Studies: Dg Chest 2 View  Result Date: 09/10/2015 CLINICAL DATA:  Fever and weakness. EXAM: CHEST  2 VIEW COMPARISON:  Multiple chest x-rays since July 06, 2015 FINDINGS: No pneumothorax. Bilateral pleural effusions and underlying opacities are identified. A rounded density in the lateral left lower lung measures up to 3.2 cm and is masslike in appearance. However, this was not present on multiple recent chest x-rays. No other nodules or masses. No overt edema. A right dialysis catheter is again identified. The cardiomediastinal silhouette is stable.  IMPRESSION: 1. Rounded masslike low-attenuation density in the lateral left lung not seen on multiple recent chest x-rays. This may represent some loculated fluid. Recommend short-term follow-up to ensure resolution. 2. Bilateral pleural effusions with underlying atelectasis. 3. No other acute abnormalities. Electronically Signed   By: Dorise Bullion III M.D   On: 09/10/2015 17:28   Ct Chest Wo Contrast  Result Date: 09/12/2015 CLINICAL DATA:  Evaluate for mass within the chest. EXAM: CT CHEST WITHOUT CONTRAST TECHNIQUE: Multidetector CT imaging of the chest was performed following the standard protocol without IV contrast. COMPARISON:  Chest radiograph 09/10/2015; chest CT 06/30/2015 FINDINGS: Cardiovascular: Heart is enlarged. Aorta and main pulmonary artery normal in caliber. Central venous catheter tip terminates at the superior cavoatrial junction. Mediastinum/Nodes: No axillary, mediastinal or hilar lymphadenopathy. Mild heterogeneity of the thyroid, unchanged. Esophagus is unremarkable. Lungs/Pleura: Central airways are patent. Subpleural ground-glass and consolidative opacities within the right and left lower lobes. Moderate layering bilateral pleural effusions, increased from prior. No pneumothorax. Motion artifact limits evaluation. There are multiple pleural-based masses demonstrated within both the left and right hemi thorax. A few of these have increased in size measuring up to 2.4 x 1.2 cm within the anterior left hemi thorax (image 66; series 2) and up to 1.3 x 2.7 cm in the lateral left hemi thorax (image 74; series 2). These are associated with underlying osseous destruction of the adjacent ribs including the anterior left second rib, posterior left third rib, the lateral left fifth rib and posterior seventh and ninth ribs. Upper Abdomen: Unremarkable Musculoskeletal: Re- demonstrated lytic lesions within the sternum. Additionally there multiple osseous destructive lesions involving  predominantly the left ribs as described above associated with soft tissue masses, most compatible patient's history of multiple myeloma. There are few patchy lucencies throughout the thoracic spine, most notable within the left T4 transverse process. IMPRESSION: Interval increase in size of multiple predominately left-sided pleural based soft tissue masses with associated underlying osseous destruction, most compatible patient's history of multiple myeloma. Persistent lytic lesion within the sternum/manubrium. Moderate layering bilateral pleural effusions, increased from prior, with underlying pulmonary consolidation most compatible with associated atelectasis. Electronically Signed   By: Lovey Newcomer M.D.   On: 09/12/2015 07:56        Scheduled Meds: . sodium chloride   Intravenous Once  . acetaminophen  1,000 mg Oral BID WC  . calcium carbonate  1 tablet Oral TID  . doxercalciferol  5 mcg Intravenous Q T,Th,Sa-HD  . enoxaparin (LOVENOX) injection  30 mg Subcutaneous Q24H  . metoprolol tartrate  50 mg Oral BID  . mirtazapine  15 mg Oral QHS  . multivitamin  1 tablet Oral QHS  . piperacillin-tazobactam (ZOSYN)  IV  3.375 g  Intravenous Q12H  . polyethylene glycol  17 g Oral Daily  . sodium chloride flush  3 mL Intravenous Q12H  . traMADol  50 mg Oral BID  . valACYclovir  500 mg Oral Daily  . vancomycin  750 mg Intravenous Q T,Th,Sa-HD   Continuous Infusions:    LOS: 2 days    Time spent: 35 min    [place], DO [facility] Pager [phone]  If 7PM-7AM, please contact night-coverage [email or web address] Password [email or web address] 09/12/2015, 1:34 PM

## 2015-09-12 NOTE — Progress Notes (Signed)
Pharmacy Antibiotic Note  Charles [name] is a 40 y.o. male admitted on 09/10/2015 with sepsis.  Pharmacy has been consulted for vancomycin and zosyn dosing. Patient is ESRD with HD TTS. I don't think he was appropriately loaded on 8/10. He was loaded with only 1 gram, but should have received 1.5 grams. However, when he went down to HD about 0800 this morning, a random VT was drawn shortly after starting. VT was at goal of 25.   Plan: -Continue Zosyn 3.375 g IV q12h -Vanomycin '750mg'$  IV post-HD  -Obtain VT about once a week -Adjust dosing if incomplete HD session  Height: '5\' 2"'$  (157.5 cm) Weight: 137 lb 12.6 oz (62.5 kg) IBW/kg (Calculated) : 54.6  Temp (24hrs), Avg:99.4 F (37.4 C), Min:97.4 F (36.3 C), Max:101.2 F (38.4 C)   Recent Labs Lab 09/07/15 1221 09/09/15 1249 09/09/15 1344 09/10/15 1424 09/10/15 1709 09/10/15 1711 09/11/15 0427  WBC  --  19.1*  --  20.7*  --  18.7* 20.7*  CREATININE 6.40*  --  4.2* 1.9*  --  2.21* 2.83*  LATICACIDVEN  --   --   --   --  3.24*  --   --     Estimated Creatinine Clearance: 26.8 mL/min (by C-G formula based on SCr of 2.83 mg/dL).    Allergies  Allergen Reactions  . Ciprofloxacin Rash  . Mobic [Meloxicam] Rash    Antimicrobials this admission: Vanc 8/10 >> Zosyn 8/10 >>  Dose adjustments this admission: n/a  Microbiology results: 8/10 BCx: ngtd UCx: sent  8/10 MRSA PCR: negative  Thank you for allowing pharmacy to be a part of this patient's care.  [name]), PharmD  [id number] Pharmacy Resident Pager: [phone] 09/12/2015 9:04 AM   I discussed / reviewed the pharmacy note by Dr. Tracey Harries and I agree with the resident's findings and plans as documented.  [employer], Pharm.D., BCPS Clinical Pharmacist Pager [phone] 09/12/2015 12:27 PM

## 2015-09-13 LAB — TYPE AND SCREEN
ABO/RH(D): O POS
Antibody Screen: NEGATIVE
UNIT DIVISION: 0
Unit division: 0
Unit division: 0

## 2015-09-13 LAB — CBC
HCT: 29.5 % — ABNORMAL LOW (ref 39.0–52.0)
Hemoglobin: 9.5 g/dL — ABNORMAL LOW (ref 13.0–17.0)
MCH: 29.6 pg (ref 26.0–34.0)
MCHC: 32.2 g/dL (ref 30.0–36.0)
MCV: 91.9 fL (ref 78.0–100.0)
PLATELETS: 250 10*3/uL (ref 150–400)
RBC: 3.21 MIL/uL — AB (ref 4.22–5.81)
RDW: 18.6 % — AB (ref 11.5–15.5)
WBC: 16.2 10*3/uL — AB (ref 4.0–10.5)

## 2015-09-13 LAB — BASIC METABOLIC PANEL
Anion gap: 13 (ref 5–15)
BUN: 15 mg/dL (ref 6–20)
CALCIUM: 7.7 mg/dL — AB (ref 8.9–10.3)
CHLORIDE: 96 mmol/L — AB (ref 101–111)
CO2: 24 mmol/L (ref 22–32)
CREATININE: 3.44 mg/dL — AB (ref 0.61–1.24)
GFR, EST AFRICAN AMERICAN: 24 mL/min — AB (ref >60)
GFR, EST NON AFRICAN AMERICAN: 21 mL/min — AB (ref >60)
Glucose, Bld: 82 mg/dL (ref 65–99)
Potassium: 3.9 mmol/L (ref 3.5–5.1)
SODIUM: 133 mmol/L — AB (ref 135–145)

## 2015-09-13 LAB — URINE CULTURE

## 2015-09-13 MED ORDER — DARBEPOETIN ALFA 100 MCG/0.5ML IJ SOSY
100.0000 ug | PREFILLED_SYRINGE | INTRAMUSCULAR | Status: DC
  Administered 2015-09-17: 100 ug via INTRAVENOUS
  Filled 2015-09-13: qty 0.5

## 2015-09-13 NOTE — Progress Notes (Signed)
PROGRESS NOTE    Charles [name]  ZTI:[id number] DOB: 24-Dec-1975 DOA: 09/10/2015 PCP: [name], MD   Outpatient Specialists:  Dr. Griffith Citron   Brief Narrative:  Charles [name] is a 40 y.o. male with hx of mult myeloma and ESRD on HD TTS who presented to cancer center for chemo today and was sent to ED for fever and tachycardia.  Wife says this started today, yesterday he was "fine".  Has been on about 18 mos of chemoRx for multiple myeloma , not all the same agent.  He started dialysis in May of this year and has a maturing AVF L upper arm.  Reports mild cough for several days, nonproductive.  He got chemo yest (gets chemoRx every Wed and Thurs for 3 wks then has one week off, he is close to finishing his 18 mos of chemo).     Assessment & Plan:   Principal Problem:   Sepsis ([place]) Active Problems:   ESRD (end stage renal disease) on dialysis (HCC)   Hypoalbuminemia due to protein-calorie malnutrition (HCC)   Multiple myeloma not having achieved remission (HCC)   Fever   Anemia   Essential hypertension     Fever with sepsis-  -unknown source -blood cultures NGTD -if continues to have fever will get ID consult -no abd pain-- wants to eat more   ESRD HD TTS -renal notified -had HD 8/10 and again 8/12   Mult myeloma - getting active chemo, last dose 8/9 -Dr. Griffith Citron saw- outpatient follow up   Anemia - d/t cancer and esrd -some volume dilution -transfused 1 unit- improved after dialysis  Malnutrition - alb 1.6  HTN on hydral/ MTP, hold for now   Pleural-based lung mass - may be related to #3 -CT scan of chest shows progression of his disease    DVT prophylaxis:  SCD's D/c'd lovenox  Code Status: Full Code   Family Communication: Patient   Disposition Plan:     Consultants:   Renal  oncology  Procedures:         Subjective: Seen with translator -asking for more food No chest pain, no SOB, no abd  pain  Objective: Vitals:   09/12/15 2355 09/13/15 0416 09/13/15 0737 09/13/15 1139  BP: 123/81 (!) 144/93 136/89 137/86  Pulse: 92 (!) 113 (!) 111 (!) 112  Resp: 18 (!) 23 (!) 23 (!) 24  Temp: 98.8 F (37.1 C) (!) 100.8 F (38.2 C) 100.2 F (37.9 C) 99.7 F (37.6 C)  TempSrc: Oral Oral Oral Oral  SpO2: 99% 98%  97%  Weight:  63 kg (138 lb 14.4 oz)    Height:        Intake/Output Summary (Last 24 hours) at 09/13/15 1209 Last data filed at 09/13/15 1139  Gross per 24 hour  Intake              460 ml  Output                0 ml  Net              460 ml   Filed Weights   09/12/15 0800 09/12/15 1207 09/13/15 0416  Weight: 63 kg (138 lb 14.2 oz) 60.5 kg (133 lb 6.1 oz) 63 kg (138 lb 14.4 oz)    Examination:  General exam: making more eye contact Respiratory system: diminished, no wheezing Cardiovascular system: mildly tachy, no edema Gastrointestinal system: Abdomen is nondistended, soft and nontender. No organomegaly or masses felt. Normal bowel  sounds heard. Central nervous system: Alert and oriented. No focal neurological deficits. Skin: No rashes, lesions or ulcers- port in place     Data Reviewed: I have personally reviewed following labs and imaging studies  CBC:  Recent Labs Lab 09/09/15 1249 09/10/15 1424 09/10/15 1711 09/11/15 0427 09/12/15 0815 09/12/15 1511 09/13/15 0608  WBC 19.1* 20.7* 18.7* 20.7* 18.0*  --  16.2*  NEUTROABS 14.7* 14.2* 12.5*  --   --   --   --   HGB 7.6* 7.6* 6.9* 6.1* 6.5* 9.9* 9.5*  HCT 24.0* 24.4* 22.2* 20.3* 21.1* 29.9* 29.5*  MCV 93.3 95.7 96.9 98.5 95.0  --  91.9  PLT 419* 357 349 322 277  --  789   Basic Metabolic Panel:  Recent Labs Lab 09/07/15 1221  09/10/15 1424 09/10/15 1711 09/10/15 2200 09/11/15 0427 09/12/15 0816 09/13/15 0608  NA 131*  < > 133* 131*  --  132* 131* 133*  K 4.7  < > 3.7 3.6  --  3.6 3.7 3.9  CL 95*  --   --  94*  --  101 101 96*  CO2  --   < > 29 28  --  23 22 24   GLUCOSE 200*  < > 95  114*  --  73 82 82  BUN 49*  < > 11.4 9  --  14 24* 15  CREATININE 6.40*  < > 1.9* 2.21*  --  2.83* 4.45* 3.44*  CALCIUM  --   < > 10.9* 9.9  --  8.7* 8.1* 7.7*  PHOS  --   --   --   --  2.2*  --  3.6  --   < > = values in this interval not displayed. GFR: Estimated Creatinine Clearance: 22 mL/min (by C-G formula based on SCr of 3.44 mg/dL). Liver Function Tests:  Recent Labs Lab 09/09/15 1249 09/09/15 1344 09/10/15 1424 09/10/15 1711 09/12/15 0816  AST  --  32 37* 36  --   ALT  --  17 23 23   --   ALKPHOS  --  275* 429* 322*  --   BILITOT  --  0.34 0.32 0.3  --   PROT 6.0 6.9 7.1 6.3*  --   ALBUMIN  --  1.5* 1.5* 1.6* 1.2*   No results for input(s): LIPASE, AMYLASE in the last 168 hours. No results for input(s): AMMONIA in the last 168 hours. Coagulation Profile:  Recent Labs Lab 09/10/15 1711  INR 1.23   Cardiac Enzymes: No results for input(s): CKTOTAL, CKMB, CKMBINDEX, TROPONINI in the last 168 hours. BNP (last 3 results) No results for input(s): PROBNP in the last 8760 hours. HbA1C: No results for input(s): HGBA1C in the last 72 hours. CBG: No results for input(s): GLUCAP in the last 168 hours. Lipid Profile: No results for input(s): CHOL, HDL, LDLCALC, TRIG, CHOLHDL, LDLDIRECT in the last 72 hours. Thyroid Function Tests: No results for input(s): TSH, T4TOTAL, FREET4, T3FREE, THYROIDAB in the last 72 hours. Anemia Panel: No results for input(s): VITAMINB12, FOLATE, FERRITIN, TIBC, IRON, RETICCTPCT in the last 72 hours. Urine analysis:    Component Value Date/Time   COLORURINE BROWN (A) 09/11/2015 2252   APPEARANCEUR TURBID (A) 09/11/2015 2252   LABSPEC 1.016 09/11/2015 2252   LABSPEC 1.030 01/22/2015 1018   PHURINE 7.5 09/11/2015 2252   GLUCOSEU 100 (A) 09/11/2015 2252   GLUCOSEU Negative 01/22/2015 1018   HGBUR LARGE (A) 09/11/2015 2252   BILIRUBINUR MODERATE (A) 09/11/2015 2252  BILIRUBINUR Negative 01/22/2015 1018   KETONESUR 15 (A) 09/11/2015 2252    PROTEINUR >300 (A) 09/11/2015 2252   UROBILINOGEN 0.2 01/22/2015 1018   NITRITE NEGATIVE 09/11/2015 2252   LEUKOCYTESUR MODERATE (A) 09/11/2015 2252   LEUKOCYTESUR Color Interference due to blood in urine 01/22/2015 1018     ) Recent Results (from the past 240 hour(s))  TECHNOLOGIST REVIEW     Status: None   Collection Time: 09/09/15 12:49 PM  Result Value Ref Range Status   Technologist Review Rare meta  Final  TECHNOLOGIST REVIEW     Status: None   Collection Time: 09/10/15  2:24 PM  Result Value Ref Range Status   Technologist Review   Final    1% nrbc, Variant lymphs, some appear plasmacytoid, present  Blood Culture (routine x 2)     Status: None (Preliminary result)   Collection Time: 09/10/15  4:40 PM  Result Value Ref Range Status   Specimen Description BLOOD RIGHT ANTECUBITAL  Final   Special Requests BOTTLES DRAWN AEROBIC AND ANAEROBIC 5CC  Final   Culture NO GROWTH 2 DAYS  Final   Report Status PENDING  Incomplete  Blood Culture (routine x 2)     Status: None (Preliminary result)   Collection Time: 09/10/15  4:52 PM  Result Value Ref Range Status   Specimen Description BLOOD RIGHT HAND  Final   Special Requests BOTTLES DRAWN AEROBIC ONLY 10CC  Final   Culture NO GROWTH 2 DAYS  Final   Report Status PENDING  Incomplete  MRSA PCR Screening     Status: None   Collection Time: 09/10/15 11:09 PM  Result Value Ref Range Status   MRSA by PCR NEGATIVE NEGATIVE Final    Comment:        The GeneXpert MRSA Assay (FDA approved for NASAL specimens only), is one component of a comprehensive MRSA colonization surveillance program. It is not intended to diagnose MRSA infection nor to guide or monitor treatment for MRSA infections.   Culture, Urine     Status: Abnormal   Collection Time: 09/11/15 11:28 AM  Result Value Ref Range Status   Specimen Description URINE, RANDOM  Final   Special Requests NONE  Final   Culture 1,000 COLONIES/mL INSIGNIFICANT GROWTH (A)  Final    Report Status 09/13/2015 FINAL  Final      Anti-infectives    Start     Dose/Rate Route Frequency Ordered Stop   09/12/15 1400  valACYclovir (VALTREX) tablet 500 mg     500 mg Oral Daily 09/12/15 1104     09/12/15 1200  vancomycin (VANCOCIN) 500 mg in sodium chloride 0.9 % 100 mL IVPB  Status:  Discontinued     500 mg 100 mL/hr over 60 Minutes Intravenous Every T-Th-Sa (Hemodialysis) 09/10/15 1959 09/12/15 0806   09/12/15 1200  vancomycin (VANCOCIN) IVPB 750 mg/150 ml premix     750 mg 150 mL/hr over 60 Minutes Intravenous Every T-Th-Sa (Hemodialysis) 09/12/15 0806     09/12/15 1017  Vancomycin (VANCOCIN) 750-5 MG/150ML-% IVPB    Comments:  [name]   : cabinet override      09/12/15 1017 09/12/15 1032   09/11/15 0600  piperacillin-tazobactam (ZOSYN) IVPB 3.375 g     3.375 g 12.5 mL/hr over 240 Minutes Intravenous Every 12 hours 09/10/15 1959     09/11/15 0530  vancomycin (VANCOCIN) 500 mg in sodium chloride 0.9 % 100 mL IVPB  Status:  Discontinued     500 mg 100 mL/hr over  60 Minutes Intravenous Every 12 hours 09/10/15 1656 09/10/15 1950   09/10/15 2330  piperacillin-tazobactam (ZOSYN) IVPB 3.375 g  Status:  Discontinued     3.375 g 12.5 mL/hr over 240 Minutes Intravenous Every 8 hours 09/10/15 1656 09/10/15 1950   09/10/15 1700  piperacillin-tazobactam (ZOSYN) IVPB 3.375 g  Status:  Discontinued     3.375 g 100 mL/hr over 30 Minutes Intravenous  Once 09/10/15 1656 09/10/15 1701   09/10/15 1700  vancomycin (VANCOCIN) IVPB 1000 mg/200 mL premix     1,000 mg 200 mL/hr over 60 Minutes Intravenous  Once 09/10/15 1656 09/10/15 1801   09/10/15 1645  piperacillin-tazobactam (ZOSYN) IVPB 3.375 g     3.375 g 100 mL/hr over 30 Minutes Intravenous  Once 09/10/15 1637 09/10/15 1721   09/10/15 1645  vancomycin (VANCOCIN) IVPB 1000 mg/200 mL premix  Status:  Discontinued     1,000 mg 200 mL/hr over 60 Minutes Intravenous  Once 09/10/15 1637 09/10/15 1701       Radiology  Studies: Ct Chest Wo Contrast  Result Date: 09/12/2015 CLINICAL DATA:  Evaluate for mass within the chest. EXAM: CT CHEST WITHOUT CONTRAST TECHNIQUE: Multidetector CT imaging of the chest was performed following the standard protocol without IV contrast. COMPARISON:  Chest radiograph 09/10/2015; chest CT 06/30/2015 FINDINGS: Cardiovascular: Heart is enlarged. Aorta and main pulmonary artery normal in caliber. Central venous catheter tip terminates at the superior cavoatrial junction. Mediastinum/Nodes: No axillary, mediastinal or hilar lymphadenopathy. Mild heterogeneity of the thyroid, unchanged. Esophagus is unremarkable. Lungs/Pleura: Central airways are patent. Subpleural ground-glass and consolidative opacities within the right and left lower lobes. Moderate layering bilateral pleural effusions, increased from prior. No pneumothorax. Motion artifact limits evaluation. There are multiple pleural-based masses demonstrated within both the left and right hemi thorax. A few of these have increased in size measuring up to 2.4 x 1.2 cm within the anterior left hemi thorax (image 66; series 2) and up to 1.3 x 2.7 cm in the lateral left hemi thorax (image 74; series 2). These are associated with underlying osseous destruction of the adjacent ribs including the anterior left second rib, posterior left third rib, the lateral left fifth rib and posterior seventh and ninth ribs. Upper Abdomen: Unremarkable Musculoskeletal: Re- demonstrated lytic lesions within the sternum. Additionally there multiple osseous destructive lesions involving predominantly the left ribs as described above associated with soft tissue masses, most compatible patient's history of multiple myeloma. There are few patchy lucencies throughout the thoracic spine, most notable within the left T4 transverse process. IMPRESSION: Interval increase in size of multiple predominately left-sided pleural based soft tissue masses with associated underlying  osseous destruction, most compatible patient's history of multiple myeloma. Persistent lytic lesion within the sternum/manubrium. Moderate layering bilateral pleural effusions, increased from prior, with underlying pulmonary consolidation most compatible with associated atelectasis. Electronically Signed   By: Lovey Newcomer M.D.   On: 09/12/2015 07:56        Scheduled Meds: . sodium chloride   Intravenous Once  . acetaminophen  1,000 mg Oral BID WC  . calcium carbonate  1 tablet Oral TID  . doxercalciferol  5 mcg Intravenous Q T,Th,Sa-HD  . metoprolol tartrate  50 mg Oral BID  . mirtazapine  15 mg Oral QHS  . multivitamin  1 tablet Oral QHS  . piperacillin-tazobactam (ZOSYN)  IV  3.375 g Intravenous Q12H  . polyethylene glycol  17 g Oral Daily  . sodium chloride flush  3 mL Intravenous Q12H  . traMADol  50 mg Oral BID  . valACYclovir  500 mg Oral Daily  . vancomycin  750 mg Intravenous Q T,Th,Sa-HD   Continuous Infusions:    LOS: 3 days    Time spent: 35 min    [place], DO [facility] Pager [phone]  If 7PM-7AM, please contact night-coverage [email or web address] Password [facility] 09/13/2015, 12:09 PM

## 2015-09-13 NOTE — Progress Notes (Addendum)
[facility] Progress Note   Subjective: no c/o, no pain or cough, still spiking temps and WBC high  Vitals:   09/12/15 2355 09/13/15 0416 09/13/15 0737 09/13/15 1139  BP: 123/81 (!) 144/93 136/89 137/86  Pulse: 92 (!) 113 (!) 111 (!) 112  Resp: 18 (!) 23 (!) 23 (!) 24  Temp: 98.8 F (37.1 C) (!) 100.8 F (38.2 C) 100.2 F (37.9 C) 99.7 F (37.6 C)  TempSrc: Oral Oral Oral Oral  SpO2: 99% 98%  97%  Weight:  63 kg (138 lb 14.4 oz)    Height:        Inpatient medications: . sodium chloride   Intravenous Once  . acetaminophen  1,000 mg Oral BID WC  . calcium carbonate  1 tablet Oral TID  . doxercalciferol  5 mcg Intravenous Q T,Th,Sa-HD  . metoprolol tartrate  50 mg Oral BID  . mirtazapine  15 mg Oral QHS  . multivitamin  1 tablet Oral QHS  . piperacillin-tazobactam (ZOSYN)  IV  3.375 g Intravenous Q12H  . polyethylene glycol  17 g Oral Daily  . sodium chloride flush  3 mL Intravenous Q12H  . traMADol  50 mg Oral BID  . valACYclovir  500 mg Oral Daily  . vancomycin  750 mg Intravenous Q T,Th,Sa-HD     sodium chloride, acetaminophen **OR** acetaminophen, HYDROmorphone (DILAUDID) injection, LORazepam, morphine injection, ondansetron **OR** ondansetron (ZOFRAN) IV, sodium chloride flush  Exam: Withdrawn as usual, no distress, calm No jvd Chest clear bilat RRR no mrg Abd soft ntnd +bs Ext no edema or wounds LUA AVF +bruit/ RIJ TDC NF, Ox 3  CXR 8/10 tiny effusions, no edema, both diaphragms visible, new rounded mass L lower lung 3.2 cm  Dialysis: Uncertain TTS  4h   58kg  2/3.5 Ca bath  Hep none  L BC AVF (created 07/13/15) Aranesp 200/wk last 8/10, Hect 5 ug Hb 8.2, tsat 23  ferr 2775      Assessment: 1  Fevers - cx's negative. Has had tumor fever on prior admits.  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 (given for skeletal protection to myeloma pts); on Tums/ Hect/ high Ca bath for the time being and Ca now is about 9 corrected 6  Malnutrition 7  Volume is 2-4 kg up by wts, bilat pleural effusions, get vol down if possible this week  Plan - extra HD Monday for volume, then resume TTS schedule    [name] MD [facility] pager [phone]    cell [phone] 09/13/2015, 2:18 PM    Recent Labs Lab 09/10/15 2200 09/11/15 0427 09/12/15 0816 09/13/15 0608  NA  --  132* 131* 133*  K  --  3.6 3.7 3.9  CL  --  101 101 96*  CO2  --  '23 22 24  '$ GLUCOSE  --  73 82 82  BUN  --  14 24* 15  CREATININE  --  2.83* 4.45* 3.44*  CALCIUM  --  8.7* 8.1* 7.7*  PHOS 2.2*  --  3.6  --     Recent Labs Lab 09/09/15 1344 09/10/15 1424 09/10/15 1711 09/12/15  0816  AST 32 37* 36  --   ALT '17 23 23  '$ --   ALKPHOS 275* 429* 322*  --   BILITOT 0.34 0.32 0.3  --   PROT 6.9 7.1 6.3*  --   ALBUMIN 1.5* 1.5* 1.6* 1.2*    Recent Labs Lab 09/09/15 1249 09/10/15 1424  09/10/15 1711 09/11/15 0427 09/12/15 0815 09/12/15 1511 09/13/15 0608  WBC 19.1* 20.7*  < > 18.7* 20.7* 18.0*  --  16.2*  NEUTROABS 14.7* 14.2*  --  12.5*  --   --   --   --   HGB 7.6* 7.6*  --  6.9* 6.1* 6.5* 9.9* 9.5*  HCT 24.0* 24.4*  --  22.2* 20.3* 21.1* 29.9* 29.5*  MCV 93.3 95.7  < > 96.9 98.5 95.0  --  91.9  PLT 419* 357  < > 349 322 277  --  250  < > = values in this interval not displayed. Iron/TIBC/Ferritin/ %Sat    Component Value Date/Time   IRON 19 (L) 08/08/2015 1850   TIBC 118 (L) 08/08/2015 1850   FERRITIN 2,104 Result Confirmed by Automated Dilution. (H) 08/12/2015 1443   IRONPCTSAT 16 (L) 08/08/2015 1850

## 2015-09-14 DIAGNOSIS — R509 Fever, unspecified: Secondary | ICD-10-CM

## 2015-09-14 DIAGNOSIS — N186 End stage renal disease: Secondary | ICD-10-CM

## 2015-09-14 DIAGNOSIS — C9000 Multiple myeloma not having achieved remission: Principal | ICD-10-CM

## 2015-09-14 DIAGNOSIS — Z992 Dependence on renal dialysis: Secondary | ICD-10-CM

## 2015-09-14 LAB — RENAL FUNCTION PANEL
ANION GAP: 8 (ref 5–15)
Albumin: 1.2 g/dL — ABNORMAL LOW (ref 3.5–5.0)
BUN: 27 mg/dL — ABNORMAL HIGH (ref 6–20)
CHLORIDE: 102 mmol/L (ref 101–111)
CO2: 25 mmol/L (ref 22–32)
CREATININE: 5.41 mg/dL — AB (ref 0.61–1.24)
Calcium: 7.7 mg/dL — ABNORMAL LOW (ref 8.9–10.3)
GFR calc non Af Amer: 12 mL/min — ABNORMAL LOW (ref >60)
GFR, EST AFRICAN AMERICAN: 14 mL/min — AB (ref >60)
Glucose, Bld: 85 mg/dL (ref 65–99)
POTASSIUM: 3.9 mmol/L (ref 3.5–5.1)
Phosphorus: 3.2 mg/dL (ref 2.5–4.6)
Sodium: 135 mmol/L (ref 135–145)

## 2015-09-14 LAB — CBC
HEMATOCRIT: 32.4 % — AB (ref 39.0–52.0)
Hemoglobin: 10.2 g/dL — ABNORMAL LOW (ref 13.0–17.0)
MCH: 29.6 pg (ref 26.0–34.0)
MCHC: 31.5 g/dL (ref 30.0–36.0)
MCV: 93.9 fL (ref 78.0–100.0)
PLATELETS: 228 10*3/uL (ref 150–400)
RBC: 3.45 MIL/uL — AB (ref 4.22–5.81)
RDW: 19 % — ABNORMAL HIGH (ref 11.5–15.5)
WBC: 16.8 10*3/uL — ABNORMAL HIGH (ref 4.0–10.5)

## 2015-09-14 MED ORDER — VANCOMYCIN HCL IN DEXTROSE 750-5 MG/150ML-% IV SOLN
750.0000 mg | Freq: Once | INTRAVENOUS | Status: DC
  Administered 2015-09-14: 750 mg via INTRAVENOUS
  Filled 2015-09-14: qty 150

## 2015-09-14 NOTE — Consult Note (Signed)
Date of Admission:  09/10/2015  Date of Consult:  09/14/2015  Reason for Consult:FUO  Referring Physician: Dr. Eliseo [name]   HPI: Charles [name] is an 40 y.o. male. hx of Multiple myeloma, CKD, whom Dr. Megan [name] and I worked up for [place] during a prior hospitalization at [facility] in May and June of 2017. He had exhaustive workup including CT chest, abdomen and pelvis, MRI of T, L spine and NO SOURCE at that time was identified and fever resolved. He presented to cancer center for chemotherapy and was sent to the ED for fever and tachycardia.   He was admitted and blood cultures were taken along with urine cultures (though he had ZERO symptoms of UTI). He has been treated with vancomycin and zosyn as well as valtrex and has been persistently febrile. He has NOT been neutropenic.  CT of the chest did not show a source of infection  He is from [place] originally and has lived in [place], [place] and [place].   He has not travelled elsewhere in [place] or [place] or [place] or [place].  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   He does not have a cat, dog, parrot or other pet. He is married and has 5 children and there were not sick contacts.   He states that he has been tested for TB in the past but tested negative.     Past Medical History:  Diagnosis Date  . ESRD (end stage renal disease) on dialysis ([place])    [address] T TH S (08/06/2015)  . GERD (gastroesophageal reflux disease)   . Headache    "monthly" (08/06/2015)  . History of blood transfusion 07/2015   "related to his kidneys"  . Hypertension   . IgG myeloma ([place])   . Kidney stones   . Lytic bone lesions on xray   . Neuromuscular disorder ([place])    states both arms are shaking a lot    Past Surgical History:  Procedure Laterality Date  . AV FISTULA PLACEMENT Left 07/13/2015   Procedure: CREATION OF LEFT UPPER ARM  ARTERIOVENOUS (AV) FISTULA ;  Surgeon: Charles Dutch, MD;  Location: [facility];  Service: Vascular;   Laterality: Left;  . INSERTION OF DIALYSIS CATHETER Right ~ 07/2015   chest    Social History:  reports that he has never smoked. He has never used smokeless tobacco. He reports that he does not drink alcohol or use drugs.   Family History  Problem Relation Age of Onset  . Diabetes Father     Allergies  Allergen Reactions  . Ciprofloxacin Rash  . Mobic [Meloxicam] Rash     Medications: I have reviewed patients current medications as documented in Epic Anti-infectives    Start     Dose/Rate Route Frequency Ordered Stop   09/14/15 1300  vancomycin (VANCOCIN) IVPB 750 mg/150 ml premix  Status:  Discontinued     750 mg 150 mL/hr over 60 Minutes Intravenous  Once 09/14/15 1250 09/14/15 1506   09/12/15 1400  valACYclovir (VALTREX) tablet 500 mg     500 mg Oral Daily 09/12/15 1104     09/12/15 1200  vancomycin (VANCOCIN) 500 mg in sodium chloride 0.9 % 100 mL IVPB  Status:  Discontinued     500 mg 100 mL/hr over 60 Minutes Intravenous Every T-Th-Sa (Hemodialysis) 09/10/15 1959 09/12/15 0806   09/12/15 1200  vancomycin (VANCOCIN) IVPB 750 mg/150 ml premix  Status:  Discontinued  750 mg 150 mL/hr over 60 Minutes Intravenous Every T-Th-Sa (Hemodialysis) 09/12/15 0806 09/14/15 1506   09/12/15 1017  Vancomycin (VANCOCIN) 750-5 MG/150ML-% IVPB    Comments:  Charles [name]   : cabinet override      09/12/15 1017 09/12/15 1032   09/11/15 0600  piperacillin-tazobactam (ZOSYN) IVPB 3.375 g     3.375 g 12.5 mL/hr over 240 Minutes Intravenous Every 12 hours 09/10/15 1959     09/11/15 0530  vancomycin (VANCOCIN) 500 mg in sodium chloride 0.9 % 100 mL IVPB  Status:  Discontinued     500 mg 100 mL/hr over 60 Minutes Intravenous Every 12 hours 09/10/15 1656 09/10/15 1950   09/10/15 2330  piperacillin-tazobactam (ZOSYN) IVPB 3.375 g  Status:  Discontinued     3.375 g 12.5 mL/hr over 240 Minutes Intravenous Every 8 hours 09/10/15 1656 09/10/15 1950   09/10/15 1700  piperacillin-tazobactam  (ZOSYN) IVPB 3.375 g  Status:  Discontinued     3.375 g 100 mL/hr over 30 Minutes Intravenous  Once 09/10/15 1656 09/10/15 1701   09/10/15 1700  vancomycin (VANCOCIN) IVPB 1000 mg/200 mL premix     1,000 mg 200 mL/hr over 60 Minutes Intravenous  Once 09/10/15 1656 09/10/15 1801   09/10/15 1645  piperacillin-tazobactam (ZOSYN) IVPB 3.375 g     3.375 g 100 mL/hr over 30 Minutes Intravenous  Once 09/10/15 1637 09/10/15 1721   09/10/15 1645  vancomycin (VANCOCIN) IVPB 1000 mg/200 mL premix  Status:  Discontinued     1,000 mg 200 mL/hr over 60 Minutes Intravenous  Once 09/10/15 1637 09/10/15 1701         ROS: as in HPI otherwise remainder of 12 point Review of Systems is   Blood pressure (!) 144/91, pulse 96, temperature 99.3 F (37.4 C), temperature source Oral, resp. rate (!) 21, height 5' 2" (1.575 m), weight 128 lb 12 oz (58.4 kg), SpO2 95 %. General: Alert and awake, oriented x3, not in any acute distress. HEENT: anicteric sclera,  EOMI, oropharynx clear and without exudate, few ulcers on lip Cardiovascular: regular rate, normal r,  no murmur rubs or gallops Pulmonary: clear to auscultation bilaterally, no wheezing, rales or rhonchi Gastrointestinal: soft nontender, nondistended, normal bowel sounds, Musculoskeletal: no  clubbing or edema noted bilaterally Skin, soft tissue: no rashes, HD catheter is not overtly infected Neuro: nonfocal, strength and sensation intact     Results for orders placed or performed during the hospital encounter of 09/10/15 (from the past 48 hour(s))  Basic metabolic panel     Status: Abnormal   Collection Time: 09/13/15  6:08 AM  Result Value Ref Range   Sodium 133 (L) 135 - 145 mmol/L   Potassium 3.9 3.5 - 5.1 mmol/L   Chloride 96 (L) 101 - 111 mmol/L   CO2 24 22 - 32 mmol/L   Glucose, Bld 82 65 - 99 mg/dL   BUN 15 6 - 20 mg/dL   Creatinine, Ser 3.44 (H) 0.61 - 1.24 mg/dL   Calcium 7.7 (L) 8.9 - 10.3 mg/dL   GFR calc non Af Amer 21 (L) >60  mL/min   GFR calc Af Amer 24 (L) >60 mL/min    Comment: (NOTE) The eGFR has been calculated using the CKD EPI equation. This calculation has not been validated in all clinical situations. eGFR's persistently <60 mL/min signify possible Chronic Kidney Disease.    Anion gap 13 5 - 15  CBC     Status: Abnormal   Collection Time: 09/13/15  6:08 AM  Result Value Ref Range   WBC 16.2 (H) 4.0 - 10.5 K/uL   RBC 3.21 (L) 4.22 - 5.81 MIL/uL   Hemoglobin 9.5 (L) 13.0 - 17.0 g/dL   HCT 29.5 (L) 39.0 - 52.0 %   MCV 91.9 78.0 - 100.0 fL   MCH 29.6 26.0 - 34.0 pg   MCHC 32.2 30.0 - 36.0 g/dL   RDW 18.6 (H) 11.5 - 15.5 %   Platelets 250 150 - 400 K/uL  CBC     Status: Abnormal   Collection Time: 09/14/15  8:15 AM  Result Value Ref Range   WBC 16.8 (H) 4.0 - 10.5 K/uL   RBC 3.45 (L) 4.22 - 5.81 MIL/uL   Hemoglobin 10.2 (L) 13.0 - 17.0 g/dL   HCT 32.4 (L) 39.0 - 52.0 %   MCV 93.9 78.0 - 100.0 fL   MCH 29.6 26.0 - 34.0 pg   MCHC 31.5 30.0 - 36.0 g/dL   RDW 19.0 (H) 11.5 - 15.5 %   Platelets 228 150 - 400 K/uL  Renal function panel     Status: Abnormal   Collection Time: 09/14/15  8:15 AM  Result Value Ref Range   Sodium 135 135 - 145 mmol/L   Potassium 3.9 3.5 - 5.1 mmol/L   Chloride 102 101 - 111 mmol/L   CO2 25 22 - 32 mmol/L   Glucose, Bld 85 65 - 99 mg/dL   BUN 27 (H) 6 - 20 mg/dL   Creatinine, Ser 5.41 (H) 0.61 - 1.24 mg/dL    Comment: DELTA CHECK NOTED   Calcium 7.7 (L) 8.9 - 10.3 mg/dL   Phosphorus 3.2 2.5 - 4.6 mg/dL   Albumin 1.2 (L) 3.5 - 5.0 g/dL   GFR calc non Af Amer 12 (L) >60 mL/min   GFR calc Af Amer 14 (L) >60 mL/min    Comment: (NOTE) The eGFR has been calculated using the CKD EPI equation. This calculation has not been validated in all clinical situations. eGFR's persistently <60 mL/min signify possible Chronic Kidney Disease.    Anion gap 8 5 - 15   _0 (sdes,specrequest,cult,reptstatus)   ) Recent Results (from the past 720 hour(s))    TECHNOLOGIST REVIEW     Status: None   Collection Time: 08/26/15  9:42 AM  Result Value Ref Range Status   Technologist Review   Final    Metas and Myelocytes present, 2% Plasma Cells, Rouleaux  TECHNOLOGIST REVIEW     Status: None   Collection Time: 09/09/15 12:49 PM  Result Value Ref Range Status   Technologist Review Rare meta  Final  TECHNOLOGIST REVIEW     Status: None   Collection Time: 09/10/15  2:24 PM  Result Value Ref Range Status   Technologist Review   Final    1% nrbc, Variant lymphs, some appear plasmacytoid, present  Blood Culture (routine x 2)     Status: None (Preliminary result)   Collection Time: 09/10/15  4:40 PM  Result Value Ref Range Status   Specimen Description BLOOD RIGHT ANTECUBITAL  Final   Special Requests BOTTLES DRAWN AEROBIC AND ANAEROBIC 5CC  Final   Culture NO GROWTH 4 DAYS  Final   Report Status PENDING  Incomplete  Blood Culture (routine x 2)     Status: None (Preliminary result)   Collection Time: 09/10/15  4:52 PM  Result Value Ref Range Status   Specimen Description BLOOD RIGHT HAND  Final   Special Requests BOTTLES DRAWN AEROBIC ONLY 10CC  Final   Culture NO GROWTH 4 DAYS  Final   Report Status PENDING  Incomplete  MRSA PCR Screening     Status: None   Collection Time: 09/10/15 11:09 PM  Result Value Ref Range Status   MRSA by PCR NEGATIVE NEGATIVE Final    Comment:        The GeneXpert MRSA Assay (FDA approved for NASAL specimens only), is one component of a comprehensive MRSA colonization surveillance program. It is not intended to diagnose MRSA infection nor to guide or monitor treatment for MRSA infections.   Culture, Urine     Status: Abnormal   Collection Time: 09/11/15 11:28 AM  Result Value Ref Range Status   Specimen Description URINE, RANDOM  Final   Special Requests NONE  Final   Culture 1,000 COLONIES/mL INSIGNIFICANT GROWTH (A)  Final   Report Status 09/13/2015 FINAL  Final      Impression/Recommendation  Principal Problem:   Sepsis ([place]) Active Problems:   ESRD (end stage renal disease) on dialysis ([place])   Hypoalbuminemia due to protein-calorie malnutrition ([place])   Multiple myeloma not having achieved remission ([place])   Fever   Anemia   Essential hypertension   Charles [name] is a 40 y.o. male with  Multiple myeloma and another admission with FUO  #1 FUO:  Would check CT abdomen and pelvis (chest already checked)  If negative would DC his antibiotics because I have no idea what is being treated  If he has persistent fevers not explained by CT scan and off antibiotics I would STRONGLY consider DC his HD line as a possible source   I will send some other FUO labs including HIV, hep panel and CMV VL and EBV antibodies.  Please keep in mind that Multiple Myeloma ITSELF can cause fevers and that this patient had similar presentation with negative workup of other cause of fever in the [place]     09/14/2015, 7:01 PM   Thank you so much for this interesting consult  [place] for [place] [phone] (pager) [phone] (office) 09/14/2015, 7:01 PM  [place] 09/14/2015, 7:01 PM

## 2015-09-14 NOTE — Procedures (Signed)
I was present at this dialysis session. I have reviewed the session itself and made appropriate changes.   4K bath via TDC with UF of 3L.  Pt not very conversant.  Plan for HD again tomorrow on schedule.   Filed Weights   09/12/15 1207 09/13/15 0416 09/14/15 0805  Weight: 60.5 kg (133 lb 6.1 oz) 63 kg (138 lb 14.4 oz) 60.3 kg (132 lb 15 oz)     Recent Labs Lab 09/12/15 0816 09/13/15 0608  NA 131* 133*  K 3.7 3.9  CL 101 96*  CO2 22 24  GLUCOSE 82 82  BUN 24* 15  CREATININE 4.45* 3.44*  CALCIUM 8.1* 7.7*  PHOS 3.6  --      Recent Labs Lab 09/09/15 1249 09/10/15 1424  09/10/15 1711 09/11/15 0427 09/12/15 0815 09/12/15 1511 09/13/15 0608  WBC 19.1* 20.7*  < > 18.7* 20.7* 18.0*  --  16.2*  NEUTROABS 14.7* 14.2*  --  12.5*  --   --   --   --   HGB 7.6* 7.6*  --  6.9* 6.1* 6.5* 9.9* 9.5*  HCT 24.0* 24.4*  --  22.2* 20.3* 21.1* 29.9* 29.5*  MCV 93.3 95.7  < > 96.9 98.5 95.0  --  91.9  PLT 419* 357  < > 349 322 277  --  250  < > = values in this interval not displayed.  Scheduled Meds: . sodium chloride   Intravenous Once  . acetaminophen  1,000 mg Oral BID WC  . calcium carbonate  1 tablet Oral TID  . [START ON 09/17/2015] darbepoetin (ARANESP) injection - DIALYSIS  100 mcg Intravenous Q Thu-HD  . doxercalciferol  5 mcg Intravenous Q T,Th,Sa-HD  . metoprolol tartrate  50 mg Oral BID  . mirtazapine  15 mg Oral QHS  . multivitamin  1 tablet Oral QHS  . piperacillin-tazobactam (ZOSYN)  IV  3.375 g Intravenous Q12H  . polyethylene glycol  17 g Oral Daily  . sodium chloride flush  3 mL Intravenous Q12H  . traMADol  50 mg Oral BID  . valACYclovir  500 mg Oral Daily  . vancomycin  750 mg Intravenous Q T,Th,Sa-HD   Continuous Infusions:  PRN Meds:.sodium chloride, acetaminophen **OR** acetaminophen, HYDROmorphone (DILAUDID) injection, LORazepam, morphine injection, ondansetron **OR** ondansetron (ZOFRAN) IV, sodium chloride flush   Pearson Grippe  MD 09/14/2015, 8:39  AM

## 2015-09-14 NOTE — Progress Notes (Signed)
Pharmacy Antibiotic Note Charles [name] is a 40 y.o. male admitted on 09/10/2015 with sepsis in setting of ongoing treatment for myeloma and ESRD (normally TTS). Pharmacy following for Zosyn and Vancomycin.   Pt received extra  IHD session today then to resume TTS.   Plan: -Continue Zosyn 3.375 g IV q12h -Vanomycin '750mg'$  IV post-HD today and then resume TTS -Obtain VT about once a week   Height: '5\' 2"'$  (157.5 cm) Weight: 128 lb 12 oz (58.4 kg) IBW/kg (Calculated) : 54.6  Temp (24hrs), Avg:99.3 F (37.4 C), Min:98.4 F (36.9 C), Max:101.6 F (38.7 C)   Recent Labs Lab 09/10/15 1709 09/10/15 1711 09/11/15 0427 09/12/15 0815 09/12/15 0816 09/12/15 0822 09/12/15 1254 09/13/15 0608 09/14/15 0815  WBC  --  18.7* 20.7* 18.0*  --   --   --  16.2* 16.8*  CREATININE  --  2.21* 2.83*  --  4.45*  --   --  3.44* 5.41*  LATICACIDVEN 3.24*  --   --   --   --   --  1.4  --   --   VANCORANDOM  --   --   --   --   --  25  --   --   --     Estimated Creatinine Clearance: 14 mL/min (by C-G formula based on SCr of 5.41 mg/dL).    Allergies  Allergen Reactions  . Ciprofloxacin Rash  . Mobic [Meloxicam] Rash    Antimicrobials this admission: Vanc 8/10 >> Zosyn 8/10 >>  Dose adjustments this admission: n/a  Microbiology results: 8/10 BCx: ngtd UCx: sent  8/10 MRSA PCR: negative  Thank you for allowing pharmacy to be a part of this patient's care.  Vincenza Hews, PharmD, BCPS 09/14/2015, 1:04 PM Pager: [phone]

## 2015-09-14 NOTE — Progress Notes (Signed)
Patient back to room 3 S 10 from hemodialysis.

## 2015-09-14 NOTE — Progress Notes (Signed)
Patient away to hemodialysis.

## 2015-09-14 NOTE — Progress Notes (Signed)
PT Cancellation Note  Patient Details Name: Charles [name] MRN: [id number] DOB: October 19, 1975   Cancelled Treatment:    Reason Eval/Treat Not Completed: Other (comment), pt recently returned from HD and reports fatigue and not wanting to get up right now. Reported that he would next time.    [place], [place] 09/14/2015, 2:05 PM

## 2015-09-14 NOTE — Progress Notes (Signed)
PROGRESS NOTE    Charles [name]  ZOX:[id number] DOB: 03-28-1975 DOA: 09/10/2015 PCP: [name], MD   Outpatient Specialists:  Dr. Griffith Citron   Brief Narrative:  Charles [name] is a 40 y.o. male with hx of mult myeloma and ESRD on HD TTS who presented to cancer center for chemo today and was sent to ED for fever and tachycardia.  Wife says this started today, yesterday he was "fine".  Has been on about 18 mos of chemoRx for multiple myeloma , not all the same agent.  He started dialysis in May of this year and has a maturing AVF L upper arm.  Reports mild cough for several days, nonproductive.  He got chemo yest (gets chemoRx every Wed and Thurs for 3 wks then has one week off, he is close to finishing his 18 mos of chemo).     Assessment & Plan:   Principal Problem:   Sepsis ([place]) Active Problems:   ESRD (end stage renal disease) on dialysis ([place])   Hypoalbuminemia due to protein-calorie malnutrition ([facility])   Multiple myeloma not having achieved remission ([facility])   Fever   Anemia   Essential hypertension     Fever with sepsis- ?tumor fever-- CT Scan shows progression -unknown source -blood cultures NGTD -ID consult -no abd pain-- wants to eat more   ESRD HD TTS -renal notified -last HD: 8/14   Mult myeloma - getting active chemo, last dose 8/9 -Dr. Griffith Citron saw- outpatient follow up   Anemia - d/t cancer and esrd -some volume dilution -transfused 1 unit- improved after dialysis  Malnutrition - alb 1.6  HTN on hydral/ MTP, hold for now   Pleural-based lung mass - may be related to #3 -CT scan of chest shows progression of his disease    DVT prophylaxis:  SCD's D/c'd lovenox  Code Status: Full Code   Family Communication: Patient  -on 8/15: translator to be there at 51 AM  Disposition Plan:     Consultants:   Renal  oncology  Procedures:         Subjective: Translator to be present tomm at 11 Continues to deny  pain   Objective: Vitals:   09/14/15 1147 09/14/15 1253 09/14/15 1400 09/14/15 1526  BP: (!) 142/98 129/86  (!) 144/91  Pulse: (!) 122 (!) 125 (!) 130 96  Resp: 20 (!) 23 15 (!) 21  Temp: 98.4 F (36.9 C) 99.8 F (37.7 C) 99.6 F (37.6 C) 99.3 F (37.4 C)  TempSrc: Oral Axillary Oral Oral  SpO2: 97% 96% 95% 95%  Weight: 58.4 kg (128 lb 12 oz)     Height:        Intake/Output Summary (Last 24 hours) at 09/14/15 1549 Last data filed at 09/14/15 1430  Gross per 24 hour  Intake               50 ml  Output             1770 ml  Net            -1720 ml   Filed Weights   09/13/15 0416 09/14/15 0805 09/14/15 1147  Weight: 63 kg (138 lb 14.4 oz) 60.3 kg (132 lb 15 oz) 58.4 kg (128 lb 12 oz)    Examination:  General exam: making more eye contact Respiratory system: diminished, no wheezing Cardiovascular system: mildly tachy, no edema Gastrointestinal system: Abdomen is nondistended, soft and nontender. No organomegaly or masses felt. Normal bowel sounds heard. Central nervous  system: Alert and oriented. No focal neurological deficits. Skin: No rashes, lesions or ulcers- port in place     Data Reviewed: I have personally reviewed following labs and imaging studies  CBC:  Recent Labs Lab 09/09/15 1249 09/10/15 1424  09/10/15 1711 09/11/15 0427 09/12/15 0815 09/12/15 1511 09/13/15 0608 09/14/15 0815  WBC 19.1* 20.7*  --  18.7* 20.7* 18.0*  --  16.2* 16.8*  NEUTROABS 14.7* 14.2*  --  12.5*  --   --   --   --   --   HGB 7.6* 7.6*  < > 6.9* 6.1* 6.5* 9.9* 9.5* 10.2*  HCT 24.0* 24.4*  < > 22.2* 20.3* 21.1* 29.9* 29.5* 32.4*  MCV 93.3 95.7  --  96.9 98.5 95.0  --  91.9 93.9  PLT 419* 357  --  349 322 277  --  250 228  < > = values in this interval not displayed. Basic Metabolic Panel:  Recent Labs Lab 09/10/15 1711 09/10/15 2200 09/11/15 0427 09/12/15 0816 09/13/15 0608 09/14/15 0815  NA 131*  --  132* 131* 133* 135  K 3.6  --  3.6 3.7 3.9 3.9  CL 94*  --   101 101 96* 102  CO2 28  --  _0 GLUCOSE 114*  --  73 82 82 85  BUN 9  --  14 24* 15 27*  CREATININE 2.21*  --  2.83* 4.45* 3.44* 5.41*  CALCIUM 9.9  --  8.7* 8.1* 7.7* 7.7*  PHOS  --  2.2*  --  3.6  --  3.2   GFR: Estimated Creatinine Clearance: 14 mL/min (by C-G formula based on SCr of 5.41 mg/dL). Liver Function Tests:  Recent Labs Lab 09/09/15 1249 09/09/15 1344 09/10/15 1424 09/10/15 1711 09/12/15 0816 09/14/15 0815  AST  --  32 37* 36  --   --   ALT  --  _1 --   --   ALKPHOS  --  275* 429* 322*  --   --   BILITOT  --  0.34 0.32 0.3  --   --   PROT 6.0 6.9 7.1 6.3*  --   --   ALBUMIN  --  1.5* 1.5* 1.6* 1.2* 1.2*   No results for input(s): LIPASE, AMYLASE in the last 168 hours. No results for input(s): AMMONIA in the last 168 hours. Coagulation Profile:  Recent Labs Lab 09/10/15 1711  INR 1.23   Cardiac Enzymes: No results for input(s): CKTOTAL, CKMB, CKMBINDEX, TROPONINI in the last 168 hours. BNP (last 3 results) No results for input(s): PROBNP in the last 8760 hours. HbA1C: No results for input(s): HGBA1C in the last 72 hours. CBG: No results for input(s): GLUCAP in the last 168 hours. Lipid Profile: No results for input(s): CHOL, HDL, LDLCALC, TRIG, CHOLHDL, LDLDIRECT in the last 72 hours. Thyroid Function Tests: No results for input(s): TSH, T4TOTAL, FREET4, T3FREE, THYROIDAB in the last 72 hours. Anemia Panel: No results for input(s): VITAMINB12, FOLATE, FERRITIN, TIBC, IRON, RETICCTPCT in the last 72 hours. Urine analysis:    Component Value Date/Time   COLORURINE BROWN (A) 09/11/2015 2252   APPEARANCEUR TURBID (A) 09/11/2015 2252   LABSPEC 1.016 09/11/2015 2252   LABSPEC 1.030 01/22/2015 1018   PHURINE 7.5 09/11/2015 2252   GLUCOSEU 100 (A) 09/11/2015 2252   GLUCOSEU Negative 01/22/2015 1018   HGBUR LARGE (A) 09/11/2015 2252   BILIRUBINUR MODERATE (A) 09/11/2015 2252   BILIRUBINUR Negative 01/22/2015 1018  KETONESUR 15 (A)  09/11/2015 2252   PROTEINUR >300 (A) 09/11/2015 2252   UROBILINOGEN 0.2 01/22/2015 1018   NITRITE NEGATIVE 09/11/2015 2252   LEUKOCYTESUR MODERATE (A) 09/11/2015 2252   LEUKOCYTESUR Color Interference due to blood in urine 01/22/2015 1018     ) Recent Results (from the past 240 hour(s))  TECHNOLOGIST REVIEW     Status: None   Collection Time: 09/09/15 12:49 PM  Result Value Ref Range Status   Technologist Review Rare meta  Final  TECHNOLOGIST REVIEW     Status: None   Collection Time: 09/10/15  2:24 PM  Result Value Ref Range Status   Technologist Review   Final    1% nrbc, Variant lymphs, some appear plasmacytoid, present  Blood Culture (routine x 2)     Status: None (Preliminary result)   Collection Time: 09/10/15  4:40 PM  Result Value Ref Range Status   Specimen Description BLOOD RIGHT ANTECUBITAL  Final   Special Requests BOTTLES DRAWN AEROBIC AND ANAEROBIC 5CC  Final   Culture NO GROWTH 4 DAYS  Final   Report Status PENDING  Incomplete  Blood Culture (routine x 2)     Status: None (Preliminary result)   Collection Time: 09/10/15  4:52 PM  Result Value Ref Range Status   Specimen Description BLOOD RIGHT HAND  Final   Special Requests BOTTLES DRAWN AEROBIC ONLY 10CC  Final   Culture NO GROWTH 4 DAYS  Final   Report Status PENDING  Incomplete  MRSA PCR Screening     Status: None   Collection Time: 09/10/15 11:09 PM  Result Value Ref Range Status   MRSA by PCR NEGATIVE NEGATIVE Final    Comment:        The GeneXpert MRSA Assay (FDA approved for NASAL specimens only), is one component of a comprehensive MRSA colonization surveillance program. It is not intended to diagnose MRSA infection nor to guide or monitor treatment for MRSA infections.   Culture, Urine     Status: Abnormal   Collection Time: 09/11/15 11:28 AM  Result Value Ref Range Status   Specimen Description URINE, RANDOM  Final   Special Requests NONE  Final   Culture 1,000 COLONIES/mL INSIGNIFICANT  GROWTH (A)  Final   Report Status 09/13/2015 FINAL  Final      Anti-infectives    Start     Dose/Rate Route Frequency Ordered Stop   09/14/15 1300  vancomycin (VANCOCIN) IVPB 750 mg/150 ml premix  Status:  Discontinued     750 mg 150 mL/hr over 60 Minutes Intravenous  Once 09/14/15 1250 09/14/15 1506   09/12/15 1400  valACYclovir (VALTREX) tablet 500 mg     500 mg Oral Daily 09/12/15 1104     09/12/15 1200  vancomycin (VANCOCIN) 500 mg in sodium chloride 0.9 % 100 mL IVPB  Status:  Discontinued     500 mg 100 mL/hr over 60 Minutes Intravenous Every T-Th-Sa (Hemodialysis) 09/10/15 1959 09/12/15 0806   09/12/15 1200  vancomycin (VANCOCIN) IVPB 750 mg/150 ml premix  Status:  Discontinued     750 mg 150 mL/hr over 60 Minutes Intravenous Every T-Th-Sa (Hemodialysis) 09/12/15 0806 09/14/15 1506   09/12/15 1017  Vancomycin (VANCOCIN) 750-5 MG/150ML-% IVPB    Comments:  Verna Czech   : cabinet override      09/12/15 1017 09/12/15 1032   09/11/15 0600  piperacillin-tazobactam (ZOSYN) IVPB 3.375 g     3.375 g 12.5 mL/hr over 240 Minutes Intravenous Every 12 hours 09/10/15 1959  09/11/15 0530  vancomycin (VANCOCIN) 500 mg in sodium chloride 0.9 % 100 mL IVPB  Status:  Discontinued     500 mg 100 mL/hr over 60 Minutes Intravenous Every 12 hours 09/10/15 1656 09/10/15 1950   09/10/15 2330  piperacillin-tazobactam (ZOSYN) IVPB 3.375 g  Status:  Discontinued     3.375 g 12.5 mL/hr over 240 Minutes Intravenous Every 8 hours 09/10/15 1656 09/10/15 1950   09/10/15 1700  piperacillin-tazobactam (ZOSYN) IVPB 3.375 g  Status:  Discontinued     3.375 g 100 mL/hr over 30 Minutes Intravenous  Once 09/10/15 1656 09/10/15 1701   09/10/15 1700  vancomycin (VANCOCIN) IVPB 1000 mg/200 mL premix     1,000 mg 200 mL/hr over 60 Minutes Intravenous  Once 09/10/15 1656 09/10/15 1801   09/10/15 1645  piperacillin-tazobactam (ZOSYN) IVPB 3.375 g     3.375 g 100 mL/hr over 30 Minutes Intravenous  Once  09/10/15 1637 09/10/15 1721   09/10/15 1645  vancomycin (VANCOCIN) IVPB 1000 mg/200 mL premix  Status:  Discontinued     1,000 mg 200 mL/hr over 60 Minutes Intravenous  Once 09/10/15 1637 09/10/15 1701       Radiology Studies: No results found.      Scheduled Meds: . sodium chloride   Intravenous Once  . acetaminophen  1,000 mg Oral BID WC  . calcium carbonate  1 tablet Oral TID  . [START ON 09/17/2015] darbepoetin (ARANESP) injection - DIALYSIS  100 mcg Intravenous Q Thu-HD  . doxercalciferol  5 mcg Intravenous Q T,Th,Sa-HD  . metoprolol tartrate  50 mg Oral BID  . mirtazapine  15 mg Oral QHS  . multivitamin  1 tablet Oral QHS  . piperacillin-tazobactam (ZOSYN)  IV  3.375 g Intravenous Q12H  . polyethylene glycol  17 g Oral Daily  . sodium chloride flush  3 mL Intravenous Q12H  . traMADol  50 mg Oral BID  . valACYclovir  500 mg Oral Daily   Continuous Infusions:    LOS: 4 days    Time spent: 35 min    [place], DO [facility] Pager [phone]  If 7PM-7AM, please contact night-coverage [email or web address] Password [facility] 09/14/2015, 3:49 PM

## 2015-09-15 ENCOUNTER — Encounter (HOSPITAL_COMMUNITY): Payer: Self-pay | Admitting: Radiology

## 2015-09-15 ENCOUNTER — Inpatient Hospital Stay (HOSPITAL_COMMUNITY): Payer: Self-pay

## 2015-09-15 DIAGNOSIS — T827XXA Infection and inflammatory reaction due to other cardiac and vascular devices, implants and grafts, initial encounter: Secondary | ICD-10-CM

## 2015-09-15 LAB — CBC
HEMATOCRIT: 30.6 % — AB (ref 39.0–52.0)
HEMOGLOBIN: 9.6 g/dL — AB (ref 13.0–17.0)
MCH: 29.6 pg (ref 26.0–34.0)
MCHC: 31.4 g/dL (ref 30.0–36.0)
MCV: 94.4 fL (ref 78.0–100.0)
Platelets: 206 10*3/uL (ref 150–400)
RBC: 3.24 MIL/uL — ABNORMAL LOW (ref 4.22–5.81)
RDW: 18.8 % — AB (ref 11.5–15.5)
WBC: 19.2 10*3/uL — AB (ref 4.0–10.5)

## 2015-09-15 LAB — CULTURE, BLOOD (ROUTINE X 2)
CULTURE: NO GROWTH
Culture: NO GROWTH

## 2015-09-15 LAB — RENAL FUNCTION PANEL
ALBUMIN: 1.2 g/dL — AB (ref 3.5–5.0)
ANION GAP: 11 (ref 5–15)
BUN: 22 mg/dL — ABNORMAL HIGH (ref 6–20)
CO2: 24 mmol/L (ref 22–32)
Calcium: 7.8 mg/dL — ABNORMAL LOW (ref 8.9–10.3)
Chloride: 97 mmol/L — ABNORMAL LOW (ref 101–111)
Creatinine, Ser: 5.05 mg/dL — ABNORMAL HIGH (ref 0.61–1.24)
GFR calc Af Amer: 15 mL/min — ABNORMAL LOW (ref >60)
GFR, EST NON AFRICAN AMERICAN: 13 mL/min — AB (ref >60)
Glucose, Bld: 87 mg/dL (ref 65–99)
PHOSPHORUS: 2.3 mg/dL — AB (ref 2.5–4.6)
POTASSIUM: 3.9 mmol/L (ref 3.5–5.1)
Sodium: 132 mmol/L — ABNORMAL LOW (ref 135–145)

## 2015-09-15 LAB — HIV ANTIBODY (ROUTINE TESTING W REFLEX): HIV Screen 4th Generation wRfx: NONREACTIVE

## 2015-09-15 MED ORDER — SODIUM CHLORIDE 0.9 % IV SOLN: 100.0000 mL | INTRAVENOUS | Status: DC | PRN

## 2015-09-15 MED ORDER — ALTEPLASE 2 MG IJ SOLR: 2.0000 mg | Freq: Once | INTRAMUSCULAR | Status: DC | PRN

## 2015-09-15 MED ORDER — LIDOCAINE HCL (PF) 1 % IJ SOLN
5.0000 mL | INTRAMUSCULAR | Status: DC | PRN
Start: 2015-09-15 — End: 2015-09-15

## 2015-09-15 MED ORDER — LIDOCAINE HCL (PF) 1 % IJ SOLN: 5.0000 mL | INTRAMUSCULAR | Status: DC | PRN

## 2015-09-15 MED ORDER — LIDOCAINE-PRILOCAINE 2.5-2.5 % EX CREA: 1.0000 "application " | TOPICAL_CREAM | CUTANEOUS | Status: DC | PRN

## 2015-09-15 MED ORDER — HEPARIN SODIUM (PORCINE) 1000 UNIT/ML DIALYSIS
2000.0000 [IU] | INTRAMUSCULAR | Status: DC | PRN
  Filled 2015-09-15: qty 2

## 2015-09-15 MED ORDER — METOPROLOL TARTRATE 50 MG PO TABS
50.0000 mg | ORAL_TABLET | Freq: Two times a day (BID) | ORAL | Status: DC
  Administered 2015-09-15 – 2015-09-18 (×5): 50 mg via ORAL
  Filled 2015-09-15 (×8): qty 1

## 2015-09-15 MED ORDER — PENTAFLUOROPROP-TETRAFLUOROETH EX AERO: 1.0000 "application " | INHALATION_SPRAY | CUTANEOUS | Status: DC | PRN

## 2015-09-15 MED ORDER — DIATRIZOATE MEGLUMINE & SODIUM 66-10 % PO SOLN
ORAL | Status: AC
  Administered 2015-09-15
  Filled 2015-09-15: qty 30

## 2015-09-15 MED ORDER — DOXERCALCIFEROL 4 MCG/2ML IV SOLN
INTRAVENOUS | Status: AC
  Filled 2015-09-15: qty 4

## 2015-09-15 MED ORDER — HEPARIN SODIUM (PORCINE) 1000 UNIT/ML DIALYSIS
1000.0000 [IU] | INTRAMUSCULAR | Status: DC | PRN
  Filled 2015-09-15: qty 1

## 2015-09-15 MED ORDER — HEPARIN SODIUM (PORCINE) 1000 UNIT/ML DIALYSIS: 1000.0000 [IU] | INTRAMUSCULAR | Status: DC | PRN

## 2015-09-15 NOTE — Procedures (Signed)
I was present at this dialysis session. I have reviewed the session itself and made appropriate changes. Cont with low grade fevers. ID has seen. AVF is 63moold, appears to be maturign well.  CT A/P planned today.   Filed Weights   09/14/15 1147 09/15/15 0323 09/15/15 0721  Weight: 58.4 kg (128 lb 12 oz) 59.2 kg (130 lb 8 oz) 58.3 kg (128 lb 8.5 oz)     Recent Labs Lab 09/15/15 0710  NA 132*  K 3.9  CL 97*  CO2 24  GLUCOSE 87  BUN 22*  CREATININE 5.05*  CALCIUM 7.8*  PHOS 2.3*     Recent Labs Lab 09/09/15 1249 09/10/15 1424 09/10/15 1711  09/13/15 0608 09/14/15 0815 09/15/15 0710  WBC 19.1* 20.7* 18.7*  < > 16.2* 16.8* 19.2*  NEUTROABS 14.7* 14.2* 12.5*  --   --   --   --   HGB 7.6* 7.6* 6.9*  < > 9.5* 10.2* 9.6*  HCT 24.0* 24.4* 22.2*  < > 29.5* 32.4* 30.6*  MCV 93.3 95.7 96.9  < > 91.9 93.9 94.4  PLT 419* 357 349  < > 250 228 206  < > = values in this interval not displayed.  Scheduled Meds: . sodium chloride   Intravenous Once  . acetaminophen  1,000 mg Oral BID WC  . calcium carbonate  1 tablet Oral TID  . [START ON 09/17/2015] darbepoetin (ARANESP) injection - DIALYSIS  100 mcg Intravenous Q Thu-HD  . doxercalciferol  5 mcg Intravenous Q T,Th,Sa-HD  . metoprolol tartrate  50 mg Oral BID  . mirtazapine  15 mg Oral QHS  . multivitamin  1 tablet Oral QHS  . piperacillin-tazobactam (ZOSYN)  IV  3.375 g Intravenous Q12H  . polyethylene glycol  17 g Oral Daily  . sodium chloride flush  3 mL Intravenous Q12H  . traMADol  50 mg Oral BID  . valACYclovir  500 mg Oral Daily   Continuous Infusions:  PRN Meds:.sodium chloride, sodium chloride, sodium chloride, acetaminophen **OR** acetaminophen, alteplase, heparin, [START ON 09/16/2015] heparin, HYDROmorphone (DILAUDID) injection, lidocaine (PF), lidocaine-prilocaine, LORazepam, morphine injection, ondansetron **OR** ondansetron (ZOFRAN) IV, pentafluoroprop-tetrafluoroeth, sodium chloride flush   RPearson Grippe  MD 09/15/2015, 9:07 AM

## 2015-09-15 NOTE — Progress Notes (Signed)
PROGRESS NOTE    Charles [name]  LKJ:[id number] DOB: 06/20/1975 DOA: 09/10/2015 PCP: [name], MD   Outpatient Specialists:  Dr. Griffith Citron   Brief Narrative:  Charles [name] is a 40 y.o. male with hx of mult myeloma and ESRD on HD TTS who presented to cancer center for chemo today and was sent to ED for fever and tachycardia.  Wife says this started today, yesterday he was "fine".  Has been on about 18 mos of chemoRx for multiple myeloma , not all the same agent.  He started dialysis in May of this year and has a maturing AVF L upper arm.  Reports mild cough for several days, nonproductive.  He got chemo yest (gets chemoRx every Wed and Thurs for 3 wks then has one week off, he is close to finishing his 18 mos of chemo).     Assessment & Plan:   Principal Problem:   Sepsis ([place]) Active Problems:   ESRD (end stage renal disease) ([place])   Hypoalbuminemia due to protein-calorie malnutrition ([facility])   Multiple myeloma not having achieved remission ([facility])   Fever   Anemia   Essential hypertension   Infection of hemodialysis catheter ([facility])     Fever with sepsis- ?tumor fever-- CT Scan shows progression -unknown source -blood cultures NGTD -ID consult- will stop abx as CT scan not impressive  -incentive spirometry  Tachycardia -no SOB -doubt PE -missed BB this AM- will give now -?deconditioned  ESRD HD TTS -renal notified -last HD: 8/15   Mult myeloma - getting active chemo, last dose 8/9 -Dr. Griffith Citron saw- outpatient follow up   Anemia - d/t cancer and esrd -some volume dilution -transfused 1 unit- improved after dialysis  Malnutrition - alb 1.6  HTN on hydral/ MTP, hold for now   Pleural-based lung mass - may be related to #3 -CT scan of chest shows progression of his disease    DVT prophylaxis:  SCD's D/c'd lovenox  Code Status: Full Code   Family Communication: Patient /wife via translator  Disposition Plan:  If HR improves, tx to  floor   Consultants:   Renal  oncology  Procedures:         Subjective: Denies pain Denies SOB   Objective: Vitals:   09/15/15 1030 09/15/15 1100 09/15/15 1122 09/15/15 1222  BP: 112/87 121/85 129/83 117/76  Pulse: (!) 136 (!) 119 (!) 126 (!) 133  Resp: 19 18 20  (!) 26  Temp:   97.5 F (36.4 C)   TempSrc:   Oral   SpO2:  100% 100% 100%  Weight:      Height:        Intake/Output Summary (Last 24 hours) at 09/15/15 1316 Last data filed at 09/15/15 1122  Gross per 24 hour  Intake               50 ml  Output             2394 ml  Net            -2344 ml   Filed Weights   09/14/15 1147 09/15/15 0323 09/15/15 0721  Weight: 58.4 kg (128 lb 12 oz) 59.2 kg (130 lb 8 oz) 58.3 kg (128 lb 8.5 oz)    Examination:  General exam: poor eye contact Respiratory system: poor effort, no wheezing Cardiovascular system: mildly tachy, no edema Gastrointestinal system: Abdomen is nondistended, soft and nontender. No organomegaly or masses felt. Normal bowel sounds heard. Central nervous system: Alert and  oriented. No focal neurological deficits. Skin: No rashes, lesions or ulcers     Data Reviewed: I have personally reviewed following labs and imaging studies  CBC:  Recent Labs Lab 09/09/15 1249 09/10/15 1424  09/10/15 1711 09/11/15 0427 09/12/15 0815 09/12/15 1511 09/13/15 0608 09/14/15 0815 09/15/15 0710  WBC 19.1* 20.7*  < > 18.7* 20.7* 18.0*  --  16.2* 16.8* 19.2*  NEUTROABS 14.7* 14.2*  --  12.5*  --   --   --   --   --   --   HGB 7.6* 7.6*  < > 6.9* 6.1* 6.5* 9.9* 9.5* 10.2* 9.6*  HCT 24.0* 24.4*  < > 22.2* 20.3* 21.1* 29.9* 29.5* 32.4* 30.6*  MCV 93.3 95.7  < > 96.9 98.5 95.0  --  91.9 93.9 94.4  PLT 419* 357  < > 349 322 277  --  250 228 206  < > = values in this interval not displayed. Basic Metabolic Panel:  Recent Labs Lab 09/10/15 2200 09/11/15 0427 09/12/15 0816 09/13/15 0608 09/14/15 0815 09/15/15 0710  NA  --  132* 131* 133* 135 132*    K  --  3.6 3.7 3.9 3.9 3.9  CL  --  101 101 96* 102 97*  CO2  --  23 22 24 25 24   GLUCOSE  --  73 82 82 85 87  BUN  --  14 24* 15 27* 22*  CREATININE  --  2.83* 4.45* 3.44* 5.41* 5.05*  CALCIUM  --  8.7* 8.1* 7.7* 7.7* 7.8*  PHOS 2.2*  --  3.6  --  3.2 2.3*   GFR: Estimated Creatinine Clearance: 15 mL/min (by C-G formula based on SCr of 5.05 mg/dL). Liver Function Tests:  Recent Labs Lab 09/09/15 1249  09/09/15 1344 09/10/15 1424 09/10/15 1711 09/12/15 0816 09/14/15 0815 09/15/15 0710  AST  --   --  32 37* 36  --   --   --   ALT  --   --  17 23 23   --   --   --   ALKPHOS  --   --  275* 429* 322*  --   --   --   BILITOT  --   --  0.34 0.32 0.3  --   --   --   PROT 6.0  --  6.9 7.1 6.3*  --   --   --   ALBUMIN  --   < > 1.5* 1.5* 1.6* 1.2* 1.2* 1.2*  < > = values in this interval not displayed. No results for input(s): LIPASE, AMYLASE in the last 168 hours. No results for input(s): AMMONIA in the last 168 hours. Coagulation Profile:  Recent Labs Lab 09/10/15 1711  INR 1.23   Cardiac Enzymes: No results for input(s): CKTOTAL, CKMB, CKMBINDEX, TROPONINI in the last 168 hours. BNP (last 3 results) No results for input(s): PROBNP in the last 8760 hours. HbA1C: No results for input(s): HGBA1C in the last 72 hours. CBG: No results for input(s): GLUCAP in the last 168 hours. Lipid Profile: No results for input(s): CHOL, HDL, LDLCALC, TRIG, CHOLHDL, LDLDIRECT in the last 72 hours. Thyroid Function Tests: No results for input(s): TSH, T4TOTAL, FREET4, T3FREE, THYROIDAB in the last 72 hours. Anemia Panel: No results for input(s): VITAMINB12, FOLATE, FERRITIN, TIBC, IRON, RETICCTPCT in the last 72 hours. Urine analysis:    Component Value Date/Time   COLORURINE BROWN (A) 09/11/2015 2252   APPEARANCEUR TURBID (A) 09/11/2015 2252   LABSPEC 1.016 09/11/2015  2252   LABSPEC 1.030 01/22/2015 1018   PHURINE 7.5 09/11/2015 2252   GLUCOSEU 100 (A) 09/11/2015 2252   GLUCOSEU  Negative 01/22/2015 1018   HGBUR LARGE (A) 09/11/2015 2252   BILIRUBINUR MODERATE (A) 09/11/2015 2252   BILIRUBINUR Negative 01/22/2015 1018   KETONESUR 15 (A) 09/11/2015 2252   PROTEINUR >300 (A) 09/11/2015 2252   UROBILINOGEN 0.2 01/22/2015 1018   NITRITE NEGATIVE 09/11/2015 2252   LEUKOCYTESUR MODERATE (A) 09/11/2015 2252   LEUKOCYTESUR Color Interference due to blood in urine 01/22/2015 1018     ) Recent Results (from the past 240 hour(s))  TECHNOLOGIST REVIEW     Status: None   Collection Time: 09/09/15 12:49 PM  Result Value Ref Range Status   Technologist Review Rare meta  Final  TECHNOLOGIST REVIEW     Status: None   Collection Time: 09/10/15  2:24 PM  Result Value Ref Range Status   Technologist Review   Final    1% nrbc, Variant lymphs, some appear plasmacytoid, present  Blood Culture (routine x 2)     Status: None (Preliminary result)   Collection Time: 09/10/15  4:40 PM  Result Value Ref Range Status   Specimen Description BLOOD RIGHT ANTECUBITAL  Final   Special Requests BOTTLES DRAWN AEROBIC AND ANAEROBIC 5CC  Final   Culture NO GROWTH 4 DAYS  Final   Report Status PENDING  Incomplete  Blood Culture (routine x 2)     Status: None (Preliminary result)   Collection Time: 09/10/15  4:52 PM  Result Value Ref Range Status   Specimen Description BLOOD RIGHT HAND  Final   Special Requests BOTTLES DRAWN AEROBIC ONLY 10CC  Final   Culture NO GROWTH 4 DAYS  Final   Report Status PENDING  Incomplete  MRSA PCR Screening     Status: None   Collection Time: 09/10/15 11:09 PM  Result Value Ref Range Status   MRSA by PCR NEGATIVE NEGATIVE Final    Comment:        The GeneXpert MRSA Assay (FDA approved for NASAL specimens only), is one component of a comprehensive MRSA colonization surveillance program. It is not intended to diagnose MRSA infection nor to guide or monitor treatment for MRSA infections.   Culture, Urine     Status: Abnormal   Collection Time:  09/11/15 11:28 AM  Result Value Ref Range Status   Specimen Description URINE, RANDOM  Final   Special Requests NONE  Final   Culture 1,000 COLONIES/mL INSIGNIFICANT GROWTH (A)  Final   Report Status 09/13/2015 FINAL  Final      Anti-infectives    Start     Dose/Rate Route Frequency Ordered Stop   09/14/15 1300  vancomycin (VANCOCIN) IVPB 750 mg/150 ml premix  Status:  Discontinued     750 mg 150 mL/hr over 60 Minutes Intravenous  Once 09/14/15 1250 09/14/15 1506   09/12/15 1400  valACYclovir (VALTREX) tablet 500 mg     500 mg Oral Daily 09/12/15 1104     09/12/15 1200  vancomycin (VANCOCIN) 500 mg in sodium chloride 0.9 % 100 mL IVPB  Status:  Discontinued     500 mg 100 mL/hr over 60 Minutes Intravenous Every T-Th-Sa (Hemodialysis) 09/10/15 1959 09/12/15 0806   09/12/15 1200  vancomycin (VANCOCIN) IVPB 750 mg/150 ml premix  Status:  Discontinued     750 mg 150 mL/hr over 60 Minutes Intravenous Every T-Th-Sa (Hemodialysis) 09/12/15 0806 09/14/15 1506   09/12/15 1017  Vancomycin (VANCOCIN) 750-5 MG/150ML-% IVPB  Comments:  [name]   : cabinet override      09/12/15 1017 09/12/15 1032   09/11/15 0600  piperacillin-tazobactam (ZOSYN) IVPB 3.375 g  Status:  Discontinued     3.375 g 12.5 mL/hr over 240 Minutes Intravenous Every 12 hours 09/10/15 1959 09/15/15 1027   09/11/15 0530  vancomycin (VANCOCIN) 500 mg in sodium chloride 0.9 % 100 mL IVPB  Status:  Discontinued     500 mg 100 mL/hr over 60 Minutes Intravenous Every 12 hours 09/10/15 1656 09/10/15 1950   09/10/15 2330  piperacillin-tazobactam (ZOSYN) IVPB 3.375 g  Status:  Discontinued     3.375 g 12.5 mL/hr over 240 Minutes Intravenous Every 8 hours 09/10/15 1656 09/10/15 1950   09/10/15 1700  piperacillin-tazobactam (ZOSYN) IVPB 3.375 g  Status:  Discontinued     3.375 g 100 mL/hr over 30 Minutes Intravenous  Once 09/10/15 1656 09/10/15 1701   09/10/15 1700  vancomycin (VANCOCIN) IVPB 1000 mg/200 mL premix     1,000  mg 200 mL/hr over 60 Minutes Intravenous  Once 09/10/15 1656 09/10/15 1801   09/10/15 1645  piperacillin-tazobactam (ZOSYN) IVPB 3.375 g     3.375 g 100 mL/hr over 30 Minutes Intravenous  Once 09/10/15 1637 09/10/15 1721   09/10/15 1645  vancomycin (VANCOCIN) IVPB 1000 mg/200 mL premix  Status:  Discontinued     1,000 mg 200 mL/hr over 60 Minutes Intravenous  Once 09/10/15 1637 09/10/15 1701       Radiology Studies: Ct Abdomen Pelvis Wo Contrast  Result Date: 09/15/2015 CLINICAL DATA:  Fever of unknown origin, acute onset. Initial encounter. EXAM: CT ABDOMEN AND PELVIS WITHOUT CONTRAST TECHNIQUE: Multidetector CT imaging of the abdomen and pelvis was performed following the standard protocol without IV contrast. COMPARISON:  CT of the chest, abdomen and pelvis performed 06/30/2015 FINDINGS: Small bilateral pleural effusions are noted. Bibasilar opacities likely reflect atelectasis, though pneumonia could have a similar appearance. The liver and spleen are unremarkable in appearance. Trace fluid adjacent to the gallbladder is nonspecific. The gallbladder is within normal limits. The pancreas and adrenal glands are unremarkable. A 1.5 cm left renal cyst is noted. There is no evidence of hydronephrosis. No renal or ureteral stones are seen. No perinephric stranding is appreciated. No free fluid is identified. The small bowel is unremarkable in appearance. The stomach is within normal limits. No acute vascular abnormalities are seen. The appendix is normal in caliber and contains air, without evidence of appendicitis. The colon is grossly unremarkable in appearance. The bladder is decompressed and not well assessed. The prostate remains normal in size. No inguinal lymphadenopathy is seen. Presacral stranding is noted. No acute osseous abnormalities are identified. Previously noted changes of multiple myeloma are again seen. IMPRESSION: 1. Small bilateral pleural effusions noted. Bibasilar airspace  opacities likely reflect atelectasis, though pneumonia could have a similar appearance. 2. Trace fluid adjacent to the gallbladder is nonspecific. The gallbladder is grossly unremarkable in appearance. 3. Small left renal cyst noted. 4. Presacral stranding noted. 5. Mild changes of multiple myeloma again noted within the visualized osseous structures. Electronically Signed   By: Garald Balding M.D.   On: 09/15/2015 03:12        Scheduled Meds: . sodium chloride   Intravenous Once  . acetaminophen  1,000 mg Oral BID WC  . calcium carbonate  1 tablet Oral TID  . [START ON 09/17/2015] darbepoetin (ARANESP) injection - DIALYSIS  100 mcg Intravenous Q Thu-HD  . doxercalciferol  5 mcg Intravenous  Q T,Th,Sa-HD  . metoprolol tartrate  50 mg Oral BID  . mirtazapine  15 mg Oral QHS  . multivitamin  1 tablet Oral QHS  . polyethylene glycol  17 g Oral Daily  . sodium chloride flush  3 mL Intravenous Q12H  . traMADol  50 mg Oral BID  . valACYclovir  500 mg Oral Daily   Continuous Infusions:    LOS: 5 days    Time spent: 35 min    [place], DO [facility] Pager [phone]  If 7PM-7AM, please contact night-coverage [email or web address] Password [email or web address] 09/15/2015, 1:16 PM

## 2015-09-15 NOTE — Progress Notes (Signed)
Subjective: No new complaints   Antibiotics:  Anti-infectives    Start     Dose/Rate Route Frequency Ordered Stop   09/14/15 1300  vancomycin (VANCOCIN) IVPB 750 mg/150 ml premix  Status:  Discontinued     750 mg 150 mL/hr over 60 Minutes Intravenous  Once 09/14/15 1250 09/14/15 1506   09/12/15 1400  valACYclovir (VALTREX) tablet 500 mg     500 mg Oral Daily 09/12/15 1104     09/12/15 1200  vancomycin (VANCOCIN) 500 mg in sodium chloride 0.9 % 100 mL IVPB  Status:  Discontinued     500 mg 100 mL/hr over 60 Minutes Intravenous Every T-Th-Sa (Hemodialysis) 09/10/15 1959 09/12/15 0806   09/12/15 1200  vancomycin (VANCOCIN) IVPB 750 mg/150 ml premix  Status:  Discontinued     750 mg 150 mL/hr over 60 Minutes Intravenous Every T-Th-Sa (Hemodialysis) 09/12/15 0806 09/14/15 1506   09/12/15 1017  Vancomycin (VANCOCIN) 750-5 MG/150ML-% IVPB    Comments:  [name]   : cabinet override      09/12/15 1017 09/12/15 1032   09/11/15 0600  piperacillin-tazobactam (ZOSYN) IVPB 3.375 g  Status:  Discontinued     3.375 g 12.5 mL/hr over 240 Minutes Intravenous Every 12 hours 09/10/15 1959 09/15/15 1027   09/11/15 0530  vancomycin (VANCOCIN) 500 mg in sodium chloride 0.9 % 100 mL IVPB  Status:  Discontinued     500 mg 100 mL/hr over 60 Minutes Intravenous Every 12 hours 09/10/15 1656 09/10/15 1950   09/10/15 2330  piperacillin-tazobactam (ZOSYN) IVPB 3.375 g  Status:  Discontinued     3.375 g 12.5 mL/hr over 240 Minutes Intravenous Every 8 hours 09/10/15 1656 09/10/15 1950   09/10/15 1700  piperacillin-tazobactam (ZOSYN) IVPB 3.375 g  Status:  Discontinued     3.375 g 100 mL/hr over 30 Minutes Intravenous  Once 09/10/15 1656 09/10/15 1701   09/10/15 1700  vancomycin (VANCOCIN) IVPB 1000 mg/200 mL premix     1,000 mg 200 mL/hr over 60 Minutes Intravenous  Once 09/10/15 1656 09/10/15 1801   09/10/15 1645  piperacillin-tazobactam (ZOSYN) IVPB 3.375 g     3.375 g 100 mL/hr over 30  Minutes Intravenous  Once 09/10/15 1637 09/10/15 1721   09/10/15 1645  vancomycin (VANCOCIN) IVPB 1000 mg/200 mL premix  Status:  Discontinued     1,000 mg 200 mL/hr over 60 Minutes Intravenous  Once 09/10/15 1637 09/10/15 1701      Medications: Scheduled Meds: . sodium chloride   Intravenous Once  . acetaminophen  1,000 mg Oral BID WC  . calcium carbonate  1 tablet Oral TID  . [START ON 09/17/2015] darbepoetin (ARANESP) injection - DIALYSIS  100 mcg Intravenous Q Thu-HD  . doxercalciferol  5 mcg Intravenous Q T,Th,Sa-HD  . metoprolol tartrate  50 mg Oral BID  . mirtazapine  15 mg Oral QHS  . multivitamin  1 tablet Oral QHS  . polyethylene glycol  17 g Oral Daily  . sodium chloride flush  3 mL Intravenous Q12H  . traMADol  50 mg Oral BID  . valACYclovir  500 mg Oral Daily   Continuous Infusions:  PRN Meds:.sodium chloride, acetaminophen **OR** acetaminophen, HYDROmorphone (DILAUDID) injection, LORazepam, morphine injection, ondansetron **OR** ondansetron (ZOFRAN) IV, sodium chloride flush    Objective: Weight change:   Intake/Output Summary (Last 24 hours) at 09/15/15 1205 Last data filed at 09/15/15 1122  Gross per 24 hour  Intake  50 ml  Output             2394 ml  Net            -2344 ml   Blood pressure 129/83, pulse (!) 126, temperature 97.5 F (36.4 C), temperature source Oral, resp. rate 20, height 5' 2"  (1.575 m), weight 128 lb 8.5 oz (58.3 kg), SpO2 100 %. Temp:  [97.5 F (36.4 C)-100.6 F (38.1 C)] 97.5 F (36.4 C) (08/15 1122) Pulse Rate:  [96-136] 126 (08/15 1122) Resp:  [15-23] 20 (08/15 1122) BP: (112-148)/(66-96) 129/83 (08/15 1122) SpO2:  [95 %-100 %] 100 % (08/15 1122) Weight:  [128 lb 8.5 oz (58.3 kg)-130 lb 8 oz (59.2 kg)] 128 lb 8.5 oz (58.3 kg) (08/15 0721)  Physical Exam: General: Alert and awake, oriented x3, not in any acute distress watching TV HEENT: anicteric sclera,  EOMI, oropharynx clear and without exudate, few ulcers on  lip Cardiovascular: regular rate, normal r,  no murmur rubs or gallops Pulmonary: clear to auscultation bilaterally, no wheezing, rales or rhonchi Gastrointestinal: soft nontender, nondistended, normal bowel sounds, Musculoskeletal: no  clubbing or edema noted bilaterally Skin, soft tissue: no rashes, HD catheter is not overtly infected Neuro: nonfocal, strength and sensation intact    CBC:  CBC Latest Ref Rng & Units 09/15/2015 09/14/2015 09/13/2015  WBC 4.0 - 10.5 K/uL 19.2(H) 16.8(H) 16.2(H)  Hemoglobin 13.0 - 17.0 g/dL 9.6(L) 10.2(L) 9.5(L)  Hematocrit 39.0 - 52.0 % 30.6(L) 32.4(L) 29.5(L)  Platelets 150 - 400 K/uL 206 228 250     BMET  Recent Labs  09/14/15 0815 09/15/15 0710  NA 135 132*  K 3.9 3.9  CL 102 97*  CO2 25 24  GLUCOSE 85 87  BUN 27* 22*  CREATININE 5.41* 5.05*  CALCIUM 7.7* 7.8*     Liver Panel   Recent Labs  09/14/15 0815 09/15/15 0710  ALBUMIN 1.2* 1.2*       Sedimentation Rate No results for input(s): ESRSEDRATE in the last 72 hours. C-Reactive Protein No results for input(s): CRP in the last 72 hours.  Micro Results: Recent Results (from the past 720 hour(s))  TECHNOLOGIST REVIEW     Status: None   Collection Time: 08/26/15  9:42 AM  Result Value Ref Range Status   Technologist Review   Final    Metas and Myelocytes present, 2% Plasma Cells, Rouleaux  TECHNOLOGIST REVIEW     Status: None   Collection Time: 09/09/15 12:49 PM  Result Value Ref Range Status   Technologist Review Rare meta  Final  TECHNOLOGIST REVIEW     Status: None   Collection Time: 09/10/15  2:24 PM  Result Value Ref Range Status   Technologist Review   Final    1% nrbc, Variant lymphs, some appear plasmacytoid, present  Blood Culture (routine x 2)     Status: None (Preliminary result)   Collection Time: 09/10/15  4:40 PM  Result Value Ref Range Status   Specimen Description BLOOD RIGHT ANTECUBITAL  Final   Special Requests BOTTLES DRAWN AEROBIC AND  ANAEROBIC 5CC  Final   Culture NO GROWTH 4 DAYS  Final   Report Status PENDING  Incomplete  Blood Culture (routine x 2)     Status: None (Preliminary result)   Collection Time: 09/10/15  4:52 PM  Result Value Ref Range Status   Specimen Description BLOOD RIGHT HAND  Final   Special Requests BOTTLES DRAWN AEROBIC ONLY 10CC  Final   Culture NO GROWTH 4  DAYS  Final   Report Status PENDING  Incomplete  MRSA PCR Screening     Status: None   Collection Time: 09/10/15 11:09 PM  Result Value Ref Range Status   MRSA by PCR NEGATIVE NEGATIVE Final    Comment:        The GeneXpert MRSA Assay (FDA approved for NASAL specimens only), is one component of a comprehensive MRSA colonization surveillance program. It is not intended to diagnose MRSA infection nor to guide or monitor treatment for MRSA infections.   Culture, Urine     Status: Abnormal   Collection Time: 09/11/15 11:28 AM  Result Value Ref Range Status   Specimen Description URINE, RANDOM  Final   Special Requests NONE  Final   Culture 1,000 COLONIES/mL INSIGNIFICANT GROWTH (A)  Final   Report Status 09/13/2015 FINAL  Final    Studies/Results: Ct Abdomen Pelvis Wo Contrast  Result Date: 09/15/2015 CLINICAL DATA:  Fever of unknown origin, acute onset. Initial encounter. EXAM: CT ABDOMEN AND PELVIS WITHOUT CONTRAST TECHNIQUE: Multidetector CT imaging of the abdomen and pelvis was performed following the standard protocol without IV contrast. COMPARISON:  CT of the chest, abdomen and pelvis performed 06/30/2015 FINDINGS: Small bilateral pleural effusions are noted. Bibasilar opacities likely reflect atelectasis, though pneumonia could have a similar appearance. The liver and spleen are unremarkable in appearance. Trace fluid adjacent to the gallbladder is nonspecific. The gallbladder is within normal limits. The pancreas and adrenal glands are unremarkable. A 1.5 cm left renal cyst is noted. There is no evidence of hydronephrosis. No  renal or ureteral stones are seen. No perinephric stranding is appreciated. No free fluid is identified. The small bowel is unremarkable in appearance. The stomach is within normal limits. No acute vascular abnormalities are seen. The appendix is normal in caliber and contains air, without evidence of appendicitis. The colon is grossly unremarkable in appearance. The bladder is decompressed and not well assessed. The prostate remains normal in size. No inguinal lymphadenopathy is seen. Presacral stranding is noted. No acute osseous abnormalities are identified. Previously noted changes of multiple myeloma are again seen. IMPRESSION: 1. Small bilateral pleural effusions noted. Bibasilar airspace opacities likely reflect atelectasis, though pneumonia could have a similar appearance. 2. Trace fluid adjacent to the gallbladder is nonspecific. The gallbladder is grossly unremarkable in appearance. 3. Small left renal cyst noted. 4. Presacral stranding noted. 5. Mild changes of multiple myeloma again noted within the visualized osseous structures. Electronically Signed   By: Garald Balding M.D.   On: 09/15/2015 03:12      Assessment/Plan:  INTERVAL HISTORY: CT abdomen without explantion of fevers, lung fields are underwhelming   Principal Problem:   Sepsis ([place]) Active Problems:   ESRD (end stage renal disease) ([place])   Hypoalbuminemia due to protein-calorie malnutrition ([place])   Multiple myeloma not having achieved remission ([place])   Fever   Anemia   Essential hypertension    Charles [name] is a 40 y.o. male  with  Multiple myeloma and another admission with FUO  #1 FUO: No cause for his fevers identified through blood cultures CT of the abdomen and CT of the chest. If he continues to have fevers I would remove his dialysis catheter as it could be a source of occult infection. I have repeated other FUO labs including HIV hepatitis panel CMV viral load and Epstein-Barr virus antibodies.  Note  the same patient had an FUO in May and June with an exhaustive workup and which revealed no  cause for the fevers besides his known multiple myeloma fevers abated during that hospitalization  Antibiotics have been discontinued.  Dr. Megan Salon will follow-up tomorrow and assume care of the inpatient consult service.   LOS: 5 days   Alcide Evener 09/15/2015, 12:05 PM

## 2015-09-15 NOTE — Progress Notes (Signed)
Dr. Eliseo Squires notified of pt's heart rate in the 130's. Pt's beta blocker that was missed while in HD was given. Will continue to monitor.

## 2015-09-16 ENCOUNTER — Other Ambulatory Visit: Payer: Self-pay

## 2015-09-16 ENCOUNTER — Ambulatory Visit: Payer: Self-pay | Admitting: Oncology

## 2015-09-16 ENCOUNTER — Ambulatory Visit: Payer: Self-pay

## 2015-09-16 DIAGNOSIS — E440 Moderate protein-calorie malnutrition: Secondary | ICD-10-CM | POA: Insufficient documentation

## 2015-09-16 DIAGNOSIS — F329 Major depressive disorder, single episode, unspecified: Secondary | ICD-10-CM

## 2015-09-16 DIAGNOSIS — D72829 Elevated white blood cell count, unspecified: Secondary | ICD-10-CM

## 2015-09-16 LAB — TSH: TSH: 3.478 u[IU]/mL (ref 0.350–4.500)

## 2015-09-16 LAB — HEPATITIS PANEL, ACUTE
HCV AB: 0.1 {s_co_ratio} (ref 0.0–0.9)
HEP B S AG: NEGATIVE
Hep A IgM: NEGATIVE
Hep B C IgM: NEGATIVE

## 2015-09-16 LAB — EPSTEIN-BARR VIRUS VCA ANTIBODY PANEL
EBV Early Antigen Ab, IgG: <9 U/mL (ref 0.0–8.9)
EBV NA IgG: <18 U/mL (ref 0.0–17.9)
EBV VCA IgM: <36 U/mL (ref 0.0–35.9)

## 2015-09-16 MED ORDER — BOOST / RESOURCE BREEZE PO LIQD
1.0000 | Freq: Three times a day (TID) | ORAL | Status: DC
  Administered 2015-09-16 – 2015-09-20 (×6): 1 via ORAL

## 2015-09-16 MED ORDER — NAPROXEN 250 MG PO TABS
250.0000 mg | ORAL_TABLET | Freq: Three times a day (TID) | ORAL | Status: DC
  Filled 2015-09-16: qty 1

## 2015-09-16 MED ORDER — PRO-STAT SUGAR FREE PO LIQD
30.0000 mL | Freq: Every day | ORAL | Status: DC
  Administered 2015-09-16 – 2015-09-18 (×3): 30 mL via ORAL
  Filled 2015-09-16 (×3): qty 30

## 2015-09-16 MED ORDER — MORPHINE SULFATE ER 15 MG PO TBCR
15.0000 mg | EXTENDED_RELEASE_TABLET | Freq: Two times a day (BID) | ORAL | Status: DC
  Administered 2015-09-16: 15 mg via ORAL
  Filled 2015-09-16 (×5): qty 1

## 2015-09-16 MED ORDER — HEPARIN SODIUM (PORCINE) 5000 UNIT/ML IJ SOLN
5000.0000 [IU] | Freq: Three times a day (TID) | INTRAMUSCULAR | Status: DC
  Administered 2015-09-16 – 2015-09-20 (×8): 5000 [IU] via SUBCUTANEOUS
  Filled 2015-09-16 (×6): qty 1

## 2015-09-16 MED ORDER — MIRTAZAPINE 7.5 MG PO TABS
7.5000 mg | ORAL_TABLET | Freq: Every day | ORAL | Status: DC
  Administered 2015-09-16 – 2015-09-17 (×2): 7.5 mg via ORAL
  Filled 2015-09-16 (×4): qty 1

## 2015-09-16 MED ORDER — NAPROXEN 250 MG PO TABS
250.0000 mg | ORAL_TABLET | Freq: Three times a day (TID) | ORAL | Status: DC
  Administered 2015-09-16 – 2015-09-17 (×3): 250 mg via ORAL
  Filled 2015-09-16 (×14): qty 1

## 2015-09-16 MED ORDER — SENNOSIDES-DOCUSATE SODIUM 8.6-50 MG PO TABS
2.0000 | ORAL_TABLET | Freq: Two times a day (BID) | ORAL | Status: DC
  Administered 2015-09-16 – 2015-09-18 (×4): 2 via ORAL
  Filled 2015-09-16 (×6): qty 2

## 2015-09-16 NOTE — Progress Notes (Signed)
Previous Rn reported to this RN that patient's temp is 103 after administration of tylenol. RN rechecked temp, 100.3 and pt is sweating profusely. RN paged on call NP, NP ordered for a rectal recheck of temp. Temp now 101.9. On call NP paged. Awaiting orders. RN will continue to monitor patient.   Ermalinda Memos, RN

## 2015-09-16 NOTE — Progress Notes (Signed)
ADDENDUM: Discussed the possibility of adding naproxen for presumed tumor fever with ID. They do not believe this would obscure an infectious fever and support the idea. Orders written.

## 2015-09-16 NOTE — Progress Notes (Signed)
PROGRESS NOTE        PATIENT DETAILS Name: Charles [name] Age: 40 y.o. Sex: male Date of Birth: 09-21-1975 Admit Date: 09/10/2015 Admitting Physician Roney Jaffe, MD [id number], Gabrielle Dare, MD  Brief Narrative: [name] with history of multiple myeloma on outpatient chemotherapy, ESRD on hemodialysis (TTS) admitted for evaluation of fever of unknown origin. Blood cultures negative. CT of the chest, along with abdomen and pelvis negative for any infection.   Subjective: Continues to have fever with sweats.  Assessment/Plan: Principal Problem: Sepsis due to fever of unknown origin: Sepsis pathophysiology has resolved, continues to be mildly tachycardic at times. Blood cultures negative and no foci of infection evident on CT chest and abdomen. Previously was empirically on vancomycin and Zosyn, no longer on antibiotics. Infectious disease following.  Active Problems: FUO: No cause of his fever identified yet-as noted above cultures are negative, no infective foci seen on CT of the abdomen and chest. HIV and acute hepatitis panel negative. CMV/EBV serology pending. Could be tumor fever, but has significant leukocytosis as well. Will check lower extremity Dopplers, to make sure no DVT as the cause of fever-again would not explain leukocytosis. Infectious disease following, await further recommendations.   Mild sinus tachycardia: Suspect secondary to ongoing fever, check TSH. No chest pain or shortness of breath to indicate venous thromboembolism, however given unexplained fever-we will go ahead and get a lower extremity Doppler ultrasound to rule out DVT.  ESRD: On hemodialysis-TTS-nephrology following  Anemia: Multifactorial-likely secondary to acute illness-with underlying anemia of chronic disease from malignancy and ESRD. Hemoglobin stable after 1 unit of transfusion. Follow.  History of multiple myeloma with pleural-based lung  metastases: Followed by oncology in the outpatient setting-chemotherapy currently on hold  Hypertension: Controlled with metoprolol  Chronic pain syndrome: Appears to be on narcotics chronically-he is sweating this morning-not sure if he is starting to withdraw, we will go ahead and resume his usual his usual regimen. Start Senokot for bowel regimen.  Malnutrition of moderate degree: Continue supplements  DVT Prophylaxis: Begin prophylactic heparin  Code Status: Full code or [name]  Family Communication: Spouse at bedside   Disposition Plan: Remain inpatient-requires several more days of hospitalization prior to discharge  Antimicrobial agents: IV vancomycin 8/10>> 8/14 IV Zosyn 8/10>> 8/15  Procedures: None  CONSULTS:  ID and nephrology  Time spent: 25 minutes-Greater than 50% of this time was spent in counseling, explanation of diagnosis, planning of further management, and coordination of care.  MEDICATIONS: Anti-infectives    Start     Dose/Rate Route Frequency Ordered Stop   09/14/15 1300  vancomycin (VANCOCIN) IVPB 750 mg/150 ml premix  Status:  Discontinued     750 mg 150 mL/hr over 60 Minutes Intravenous  Once 09/14/15 1250 09/14/15 1506   09/12/15 1400  valACYclovir (VALTREX) tablet 500 mg     500 mg Oral Daily 09/12/15 1104     09/12/15 1200  vancomycin (VANCOCIN) 500 mg in sodium chloride 0.9 % 100 mL IVPB  Status:  Discontinued     500 mg 100 mL/hr over 60 Minutes Intravenous Every T-Th-Sa (Hemodialysis) 09/10/15 1959 09/12/15 0806   09/12/15 1200  vancomycin (VANCOCIN) IVPB 750 mg/150 ml premix  Status:  Discontinued     750 mg 150 mL/hr over 60 Minutes Intravenous Every T-Th-Sa (Hemodialysis) 09/12/15 0806 09/14/15 1506   09/12/15  1017  Vancomycin (VANCOCIN) 750-5 MG/150ML-% IVPB    Comments:  [name]   : cabinet override      09/12/15 1017 09/12/15 1032   09/11/15 0600  piperacillin-tazobactam (ZOSYN) IVPB 3.375 g  Status:  Discontinued     3.375  g 12.5 mL/hr over 240 Minutes Intravenous Every 12 hours 09/10/15 1959 09/15/15 1027   09/11/15 0530  vancomycin (VANCOCIN) 500 mg in sodium chloride 0.9 % 100 mL IVPB  Status:  Discontinued     500 mg 100 mL/hr over 60 Minutes Intravenous Every 12 hours 09/10/15 1656 09/10/15 1950   09/10/15 2330  piperacillin-tazobactam (ZOSYN) IVPB 3.375 g  Status:  Discontinued     3.375 g 12.5 mL/hr over 240 Minutes Intravenous Every 8 hours 09/10/15 1656 09/10/15 1950   09/10/15 1700  piperacillin-tazobactam (ZOSYN) IVPB 3.375 g  Status:  Discontinued     3.375 g 100 mL/hr over 30 Minutes Intravenous  Once 09/10/15 1656 09/10/15 1701   09/10/15 1700  vancomycin (VANCOCIN) IVPB 1000 mg/200 mL premix     1,000 mg 200 mL/hr over 60 Minutes Intravenous  Once 09/10/15 1656 09/10/15 1801   09/10/15 1645  piperacillin-tazobactam (ZOSYN) IVPB 3.375 g     3.375 g 100 mL/hr over 30 Minutes Intravenous  Once 09/10/15 1637 09/10/15 1721   09/10/15 1645  vancomycin (VANCOCIN) IVPB 1000 mg/200 mL premix  Status:  Discontinued     1,000 mg 200 mL/hr over 60 Minutes Intravenous  Once 09/10/15 1637 09/10/15 1701      Scheduled Meds: . sodium chloride   Intravenous Once  . acetaminophen  1,000 mg Oral BID WC  . calcium carbonate  1 tablet Oral TID  . [START ON 09/17/2015] darbepoetin (ARANESP) injection - DIALYSIS  100 mcg Intravenous Q Thu-HD  . doxercalciferol  5 mcg Intravenous Q T,Th,Sa-HD  . feeding supplement  1 Container Oral TID BM  . feeding supplement (PRO-STAT SUGAR FREE 64)  30 mL Oral Q1500  . metoprolol tartrate  50 mg Oral BID  . mirtazapine  7.5 mg Oral QHS  . multivitamin  1 tablet Oral QHS  . polyethylene glycol  17 g Oral Daily  . sodium chloride flush  3 mL Intravenous Q12H  . traMADol  50 mg Oral BID  . valACYclovir  500 mg Oral Daily   Continuous Infusions:  PRN Meds:.sodium chloride, acetaminophen **OR** acetaminophen, HYDROmorphone (DILAUDID) injection, LORazepam, morphine  injection, ondansetron **OR** ondansetron (ZOFRAN) IV, sodium chloride flush   PHYSICAL EXAM: Vital signs: Vitals:   09/15/15 1847 09/15/15 2156 09/16/15 0419 09/16/15 0849  BP: 95/66 121/76 133/72 124/82  Pulse: (!) 105 (!) 104 (!) 111 (!) 116  Resp: 19 18 18    Temp: 98.4 F (36.9 C) 98.6 F (37 C) 100.3 F (37.9 C) (!) 100.4 F (38 C)  TempSrc: Oral Oral Oral Oral  SpO2: 97% 96% 94% 95%  Weight:      Height:       Filed Weights   09/14/15 1147 09/15/15 0323 09/15/15 0721  Weight: 58.4 kg (128 lb 12 oz) 59.2 kg (130 lb 8 oz) 58.3 kg (128 lb 8.5 oz)   Body mass index is 23.51 kg/m.   Gen Exam: Awake and alert with clear speech.Diaphoretic this morning-looks chronically ill but is not  in any distress  Neck: Supple, No JVD.   Chest: B/L Clear.  CVS: S1 S2 Regular, no murmurs.  Abdomen: soft, BS +, non tender, non distended.  Extremities: no edema, lower extremities  warm to touch. Neurologic: Non Focal.   Skin: No Rash or lesions   Wounds: N/A.   I have personally reviewed following labs and imaging studies  LABORATORY DATA: CBC:  Recent Labs Lab 09/10/15 1424  09/10/15 1711 09/11/15 0427 09/12/15 0815 09/12/15 1511 09/13/15 0608 09/14/15 0815 09/15/15 0710  WBC 20.7*  < > 18.7* 20.7* 18.0*  --  16.2* 16.8* 19.2*  NEUTROABS 14.2*  --  12.5*  --   --   --   --   --   --   HGB 7.6*  < > 6.9* 6.1* 6.5* 9.9* 9.5* 10.2* 9.6*  HCT 24.4*  < > 22.2* 20.3* 21.1* 29.9* 29.5* 32.4* 30.6*  MCV 95.7  < > 96.9 98.5 95.0  --  91.9 93.9 94.4  PLT 357  < > 349 322 277  --  250 228 206  < > = values in this interval not displayed.  Basic Metabolic Panel:  Recent Labs Lab 09/10/15 2200 09/11/15 0427 09/12/15 0816 09/13/15 0608 09/14/15 0815 09/15/15 0710  NA  --  132* 131* 133* 135 132*  K  --  3.6 3.7 3.9 3.9 3.9  CL  --  101 101 96* 102 97*  CO2  --  23 22 24 25 24   GLUCOSE  --  73 82 82 85 87  BUN  --  14 24* 15 27* 22*  CREATININE  --  2.83* 4.45* 3.44*  5.41* 5.05*  CALCIUM  --  8.7* 8.1* 7.7* 7.7* 7.8*  PHOS 2.2*  --  3.6  --  3.2 2.3*    GFR: Estimated Creatinine Clearance: 15 mL/min (by C-G formula based on SCr of 5.05 mg/dL).  Liver Function Tests:  Recent Labs Lab 09/10/15 1424 09/10/15 1711 09/12/15 0816 09/14/15 0815 09/15/15 0710  AST 37* 36  --   --   --   ALT 23 23  --   --   --   ALKPHOS 429* 322*  --   --   --   BILITOT 0.32 0.3  --   --   --   PROT 7.1 6.3*  --   --   --   ALBUMIN 1.5* 1.6* 1.2* 1.2* 1.2*   No results for input(s): LIPASE, AMYLASE in the last 168 hours. No results for input(s): AMMONIA in the last 168 hours.  Coagulation Profile:  Recent Labs Lab 09/10/15 1711  INR 1.23    Cardiac Enzymes: No results for input(s): CKTOTAL, CKMB, CKMBINDEX, TROPONINI in the last 168 hours.  BNP (last 3 results) No results for input(s): PROBNP in the last 8760 hours.  HbA1C: No results for input(s): HGBA1C in the last 72 hours.  CBG: No results for input(s): GLUCAP in the last 168 hours.  Lipid Profile: No results for input(s): CHOL, HDL, LDLCALC, TRIG, CHOLHDL, LDLDIRECT in the last 72 hours.  Thyroid Function Tests: No results for input(s): TSH, T4TOTAL, FREET4, T3FREE, THYROIDAB in the last 72 hours.  Anemia Panel: No results for input(s): VITAMINB12, FOLATE, FERRITIN, TIBC, IRON, RETICCTPCT in the last 72 hours.  Urine analysis:    Component Value Date/Time   COLORURINE BROWN (A) 09/11/2015 2252   APPEARANCEUR TURBID (A) 09/11/2015 2252   LABSPEC 1.016 09/11/2015 2252   LABSPEC 1.030 01/22/2015 1018   PHURINE 7.5 09/11/2015 2252   GLUCOSEU 100 (A) 09/11/2015 2252   GLUCOSEU Negative 01/22/2015 1018   HGBUR LARGE (A) 09/11/2015 2252   BILIRUBINUR MODERATE (A) 09/11/2015 2252   BILIRUBINUR Negative 01/22/2015 1018  KETONESUR 15 (A) 09/11/2015 2252   PROTEINUR >300 (A) 09/11/2015 2252   UROBILINOGEN 0.2 01/22/2015 1018   NITRITE NEGATIVE 09/11/2015 2252   LEUKOCYTESUR MODERATE (A)  09/11/2015 2252   LEUKOCYTESUR Color Interference due to blood in urine 01/22/2015 1018    Sepsis Labs: Lactic Acid, Venous    Component Value Date/Time   LATICACIDVEN 1.4 09/12/2015 1254    MICROBIOLOGY: Recent Results (from the past 240 hour(s))  TECHNOLOGIST REVIEW     Status: None   Collection Time: 09/09/15 12:49 PM  Result Value Ref Range Status   Technologist Review Rare meta  Final  TECHNOLOGIST REVIEW     Status: None   Collection Time: 09/10/15  2:24 PM  Result Value Ref Range Status   Technologist Review   Final    1% nrbc, Variant lymphs, some appear plasmacytoid, present  Blood Culture (routine x 2)     Status: None   Collection Time: 09/10/15  4:40 PM  Result Value Ref Range Status   Specimen Description BLOOD RIGHT ANTECUBITAL  Final   Special Requests BOTTLES DRAWN AEROBIC AND ANAEROBIC 5CC  Final   Culture NO GROWTH 5 DAYS  Final   Report Status 09/15/2015 FINAL  Final  Blood Culture (routine x 2)     Status: None   Collection Time: 09/10/15  4:52 PM  Result Value Ref Range Status   Specimen Description BLOOD RIGHT HAND  Final   Special Requests BOTTLES DRAWN AEROBIC ONLY 10CC  Final   Culture NO GROWTH 5 DAYS  Final   Report Status 09/15/2015 FINAL  Final  MRSA PCR Screening     Status: None   Collection Time: 09/10/15 11:09 PM  Result Value Ref Range Status   MRSA by PCR NEGATIVE NEGATIVE Final    Comment:        The GeneXpert MRSA Assay (FDA approved for NASAL specimens only), is one component of a comprehensive MRSA colonization surveillance program. It is not intended to diagnose MRSA infection nor to guide or monitor treatment for MRSA infections.   Culture, Urine     Status: Abnormal   Collection Time: 09/11/15 11:28 AM  Result Value Ref Range Status   Specimen Description URINE, RANDOM  Final   Special Requests NONE  Final   Culture 1,000 COLONIES/mL INSIGNIFICANT GROWTH (A)  Final   Report Status 09/13/2015 FINAL  Final     RADIOLOGY STUDIES/RESULTS: Ct Abdomen Pelvis Wo Contrast  Result Date: 09/15/2015 CLINICAL DATA:  Fever of unknown origin, acute onset. Initial encounter. EXAM: CT ABDOMEN AND PELVIS WITHOUT CONTRAST TECHNIQUE: Multidetector CT imaging of the abdomen and pelvis was performed following the standard protocol without IV contrast. COMPARISON:  CT of the chest, abdomen and pelvis performed 06/30/2015 FINDINGS: Small bilateral pleural effusions are noted. Bibasilar opacities likely reflect atelectasis, though pneumonia could have a similar appearance. The liver and spleen are unremarkable in appearance. Trace fluid adjacent to the gallbladder is nonspecific. The gallbladder is within normal limits. The pancreas and adrenal glands are unremarkable. A 1.5 cm left renal cyst is noted. There is no evidence of hydronephrosis. No renal or ureteral stones are seen. No perinephric stranding is appreciated. No free fluid is identified. The small bowel is unremarkable in appearance. The stomach is within normal limits. No acute vascular abnormalities are seen. The appendix is normal in caliber and contains air, without evidence of appendicitis. The colon is grossly unremarkable in appearance. The bladder is decompressed and not well assessed. The prostate remains normal  in size. No inguinal lymphadenopathy is seen. Presacral stranding is noted. No acute osseous abnormalities are identified. Previously noted changes of multiple myeloma are again seen. IMPRESSION: 1. Small bilateral pleural effusions noted. Bibasilar airspace opacities likely reflect atelectasis, though pneumonia could have a similar appearance. 2. Trace fluid adjacent to the gallbladder is nonspecific. The gallbladder is grossly unremarkable in appearance. 3. Small left renal cyst noted. 4. Presacral stranding noted. 5. Mild changes of multiple myeloma again noted within the visualized osseous structures. Electronically Signed   By: Garald Balding M.D.    On: 09/15/2015 03:12   Dg Chest 2 View  Result Date: 09/10/2015 CLINICAL DATA:  Fever and weakness. EXAM: CHEST  2 VIEW COMPARISON:  Multiple chest x-rays since July 06, 2015 FINDINGS: No pneumothorax. Bilateral pleural effusions and underlying opacities are identified. A rounded density in the lateral left lower lung measures up to 3.2 cm and is masslike in appearance. However, this was not present on multiple recent chest x-rays. No other nodules or masses. No overt edema. A right dialysis catheter is again identified. The cardiomediastinal silhouette is stable. IMPRESSION: 1. Rounded masslike low-attenuation density in the lateral left lung not seen on multiple recent chest x-rays. This may represent some loculated fluid. Recommend short-term follow-up to ensure resolution. 2. Bilateral pleural effusions with underlying atelectasis. 3. No other acute abnormalities. Electronically Signed   By: Dorise Bullion III M.D   On: 09/10/2015 17:28   Ct Chest Wo Contrast  Result Date: 09/12/2015 CLINICAL DATA:  Evaluate for mass within the chest. EXAM: CT CHEST WITHOUT CONTRAST TECHNIQUE: Multidetector CT imaging of the chest was performed following the standard protocol without IV contrast. COMPARISON:  Chest radiograph 09/10/2015; chest CT 06/30/2015 FINDINGS: Cardiovascular: Heart is enlarged. Aorta and main pulmonary artery normal in caliber. Central venous catheter tip terminates at the superior cavoatrial junction. Mediastinum/Nodes: No axillary, mediastinal or hilar lymphadenopathy. Mild heterogeneity of the thyroid, unchanged. Esophagus is unremarkable. Lungs/Pleura: Central airways are patent. Subpleural ground-glass and consolidative opacities within the right and left lower lobes. Moderate layering bilateral pleural effusions, increased from prior. No pneumothorax. Motion artifact limits evaluation. There are multiple pleural-based masses demonstrated within both the left and right hemi thorax. A few of  these have increased in size measuring up to 2.4 x 1.2 cm within the anterior left hemi thorax (image 66; series 2) and up to 1.3 x 2.7 cm in the lateral left hemi thorax (image 74; series 2). These are associated with underlying osseous destruction of the adjacent ribs including the anterior left second rib, posterior left third rib, the lateral left fifth rib and posterior seventh and ninth ribs. Upper Abdomen: Unremarkable Musculoskeletal: Re- demonstrated lytic lesions within the sternum. Additionally there multiple osseous destructive lesions involving predominantly the left ribs as described above associated with soft tissue masses, most compatible patient's history of multiple myeloma. There are few patchy lucencies throughout the thoracic spine, most notable within the left T4 transverse process. IMPRESSION: Interval increase in size of multiple predominately left-sided pleural based soft tissue masses with associated underlying osseous destruction, most compatible patient's history of multiple myeloma. Persistent lytic lesion within the sternum/manubrium. Moderate layering bilateral pleural effusions, increased from prior, with underlying pulmonary consolidation most compatible with associated atelectasis. Electronically Signed   By: Lovey Newcomer M.D.   On: 09/12/2015 07:56     LOS: 6 days   Oren Binet, MD  [facility] Pager:[phone]  If 7PM-7AM, please contact night-coverage [email or web address] Password [email or web address] 09/16/2015, 4:20 PM

## 2015-09-16 NOTE — Progress Notes (Signed)
[place]   DOB:December 12, 1975   FQ#:[id number]   VWA#:[id number]  Subjective: Charles [name] tells me "they are trying to find what's happening but so far they're not sure." He understands the temperatures and elevated WBC might be due to myeloma but that his immune system is very weak and he could have atypical infections which can be hard to diagnose. Denies pain. Tells me he is staying mostly in bed, not walking or sitting up. Pain is well controlled. Was constipated yesterday. Denies h/a, N/V, cough, pleurisy, SOB. Wife in room   Objective:  Spanish speaker examinedin bed Vitals:   09/15/15 2156 09/16/15 0419  BP: 121/76 133/72  Pulse: (!) 104 (!) 111  Resp: 18 18  Temp: 98.6 F (37 C) 100.3 F (37.9 C)    Body mass index is 23.51 kg/m.  Intake/Output Summary (Last 24 hours) at 09/16/15 0741 Last data filed at 09/16/15 0200  Gross per 24 hour  Intake              120 ml  Output             2394 ml  Net            -2274 ml     Lungs no rales or wheezes--auscultated anterolaterally  Heart regular rate and rhythm  Abdomen soft, +BS  Neuro nonfocal   CBG (last 3)  No results for input(s): GLUCAP in the last 72 hours.   Labs:  Lab Results  Component Value Date   WBC 19.2 (H) 09/15/2015   HGB 9.6 (L) 09/15/2015   HCT 30.6 (L) 09/15/2015   MCV 94.4 09/15/2015   PLT 206 09/15/2015   NEUTROABS 12.5 (H) 09/10/2015    @LASTCHEMISTRY @  Urine Studies No results for input(s): UHGB, CRYS in the last 72 hours.  Invalid input(s): UACOL, UAPR, USPG, UPH, UTP, UGL, UKET, UBIL, UNIT, UROB, [place], UEPI, UWBC, Junie Panning [place], [place], [place]  Basic Metabolic Panel:  Recent Labs Lab 09/10/15 2200 09/11/15 0427 09/12/15 0816 09/13/15 0608 09/14/15 0815 09/15/15 0710  NA  --  132* 131* 133* 135 132*  K  --  3.6 3.7 3.9 3.9 3.9  CL  --  101 101 96* 102 97*  CO2  --  23 22 24 25 24   GLUCOSE  --  73 82 82 85 87  BUN  --  14 24* 15 27* 22*  CREATININE  --  2.83* 4.45* 3.44*  5.41* 5.05*  CALCIUM  --  8.7* 8.1* 7.7* 7.7* 7.8*  PHOS 2.2*  --  3.6  --  3.2 2.3*   GFR Estimated Creatinine Clearance: 15 mL/min (by C-G formula based on SCr of 5.05 mg/dL). Liver Function Tests:  Recent Labs Lab 09/09/15 1249  09/09/15 1344 09/10/15 1424 09/10/15 1711 09/12/15 0816 09/14/15 0815 09/15/15 0710  AST  --   --  32 37* 36  --   --   --   ALT  --   --  17 23 23   --   --   --   ALKPHOS  --   --  275* 429* 322*  --   --   --   BILITOT  --   --  0.34 0.32 0.3  --   --   --   PROT 6.0  --  6.9 7.1 6.3*  --   --   --   ALBUMIN  --   < > 1.5* 1.5* 1.6* 1.2* 1.2* 1.2*  < > = values  in this interval not displayed. No results for input(s): LIPASE, AMYLASE in the last 168 hours. No results for input(s): AMMONIA in the last 168 hours. Coagulation profile  Recent Labs Lab 09/10/15 1711  INR 1.23    CBC:  Recent Labs Lab 09/09/15 1249 09/10/15 1424  09/10/15 1711 09/11/15 0427 09/12/15 0815 09/12/15 1511 09/13/15 0608 09/14/15 0815 09/15/15 0710  WBC 19.1* 20.7*  < > 18.7* 20.7* 18.0*  --  16.2* 16.8* 19.2*  NEUTROABS 14.7* 14.2*  --  12.5*  --   --   --   --   --   --   HGB 7.6* 7.6*  < > 6.9* 6.1* 6.5* 9.9* 9.5* 10.2* 9.6*  HCT 24.0* 24.4*  < > 22.2* 20.3* 21.1* 29.9* 29.5* 32.4* 30.6*  MCV 93.3 95.7  < > 96.9 98.5 95.0  --  91.9 93.9 94.4  PLT 419* 357  < > 349 322 277  --  250 228 206  < > = values in this interval not displayed. Cardiac Enzymes: No results for input(s): CKTOTAL, CKMB, CKMBINDEX, TROPONINI in the last 168 hours. BNP: Invalid input(s): POCBNP CBG: No results for input(s): GLUCAP in the last 168 hours. D-Dimer No results for input(s): DDIMER in the last 72 hours. Hgb A1c No results for input(s): HGBA1C in the last 72 hours. Lipid Profile No results for input(s): CHOL, HDL, LDLCALC, TRIG, CHOLHDL, LDLDIRECT in the last 72 hours. Thyroid function studies No results for input(s): TSH, T4TOTAL, T3FREE, THYROIDAB in the last 72  hours.  Invalid input(s): FREET3 Anemia work up No results for input(s): VITAMINB12, FOLATE, FERRITIN, TIBC, IRON, RETICCTPCT in the last 72 hours. Microbiology Recent Results (from the past 240 hour(s))  TECHNOLOGIST REVIEW     Status: None   Collection Time: 09/09/15 12:49 PM  Result Value Ref Range Status   Technologist Review Rare meta  Final  TECHNOLOGIST REVIEW     Status: None   Collection Time: 09/10/15  2:24 PM  Result Value Ref Range Status   Technologist Review   Final    1% nrbc, Variant lymphs, some appear plasmacytoid, present  Blood Culture (routine x 2)     Status: None   Collection Time: 09/10/15  4:40 PM  Result Value Ref Range Status   Specimen Description BLOOD RIGHT ANTECUBITAL  Final   Special Requests BOTTLES DRAWN AEROBIC AND ANAEROBIC 5CC  Final   Culture NO GROWTH 5 DAYS  Final   Report Status 09/15/2015 FINAL  Final  Blood Culture (routine x 2)     Status: None   Collection Time: 09/10/15  4:52 PM  Result Value Ref Range Status   Specimen Description BLOOD RIGHT HAND  Final   Special Requests BOTTLES DRAWN AEROBIC ONLY 10CC  Final   Culture NO GROWTH 5 DAYS  Final   Report Status 09/15/2015 FINAL  Final  MRSA PCR Screening     Status: None   Collection Time: 09/10/15 11:09 PM  Result Value Ref Range Status   MRSA by PCR NEGATIVE NEGATIVE Final    Comment:        The GeneXpert MRSA Assay (FDA approved for NASAL specimens only), is one component of a comprehensive MRSA colonization surveillance program. It is not intended to diagnose MRSA infection nor to guide or monitor treatment for MRSA infections.   Culture, Urine     Status: Abnormal   Collection Time: 09/11/15 11:28 AM  Result Value Ref Range Status   Specimen Description URINE, RANDOM  Final   Special Requests NONE  Final   Culture 1,000 COLONIES/mL INSIGNIFICANT GROWTH (A)  Final   Report Status 09/13/2015 FINAL  Final   Results for Charles [name], Charles [name] (MRN [id number]) as of  09/12/2015 10:54  Ref. Range 10/15/2014 08:12 12/12/2014 15:06 01/20/2015 09:52 02/12/2015 14:33 03/12/2015 12:53 04/09/2015 15:34 05/14/2015 13:25 06/11/2015 10:10 07/15/2015 11:05 08/12/2015 13:20 09/09/2015 12:49  IgG (Immunoglobin G), Serum Latest Ref Range: 700 - 1600 mg/dL 1,170 1,210 1,680 (H) 701 1,209 1,327 2,018 (H) 1,928 (H) 433 (L) 1,588 1,452   Results for Charles [name], Charles [name] (MRN [id number]) as of 09/12/2015 10:54  Ref. Range 05/12/2014 13:37 06/02/2014 14:34 06/23/2014 14:47 07/25/2014 15:50 08/22/2014 15:36 09/19/2014 15:09 10/15/2014 08:12 11/14/2014 15:27 12/12/2014 15:06 01/01/2015 12:03 01/20/2015 09:52  Kappa:Lambda Ratio Latest Ref Range: 0.26 - 1.65  1.74 (H) 1.24 1.25 1.63 1.63 1.54 1.72 (H) 1.61 2.10 (H) 3.10 (H) 4.41 (H)  Results for Charles [name], Charles [name] (MRN [id number]) as of 09/12/2015 10:54  Ref. Range 02/12/2015 14:33 03/12/2015 12:53 04/09/2015 15:34 05/14/2015 13:25 06/11/2015 10:10 07/15/2015 11:05 08/12/2015 13:20 09/09/2015 12:49  M Protein SerPl Elph-Mcnc Latest Ref Range: Not Observed g/dL 0.5 (H) 1.1 (H) 1.2 (H) 1.8 (H) 1.7 (H) 0.3 (H) 1.4 (H) 1.4 (H)    Studies:  Ct Abdomen Pelvis Wo Contrast  Result Date: 09/15/2015 CLINICAL DATA:  Fever of unknown origin, acute onset. Initial encounter. EXAM: CT ABDOMEN AND PELVIS WITHOUT CONTRAST TECHNIQUE: Multidetector CT imaging of the abdomen and pelvis was performed following the standard protocol without IV contrast. COMPARISON:  CT of the chest, abdomen and pelvis performed 06/30/2015 FINDINGS: Small bilateral pleural effusions are noted. Bibasilar opacities likely reflect atelectasis, though pneumonia could have a similar appearance. The liver and spleen are unremarkable in appearance. Trace fluid adjacent to the gallbladder is nonspecific. The gallbladder is within normal limits. The pancreas and adrenal glands are unremarkable. A 1.5 cm left renal cyst is noted. There is no evidence of hydronephrosis. No renal or ureteral stones are seen. No  perinephric stranding is appreciated. No free fluid is identified. The small bowel is unremarkable in appearance. The stomach is within normal limits. No acute vascular abnormalities are seen. The appendix is normal in caliber and contains air, without evidence of appendicitis. The colon is grossly unremarkable in appearance. The bladder is decompressed and not well assessed. The prostate remains normal in size. No inguinal lymphadenopathy is seen. Presacral stranding is noted. No acute osseous abnormalities are identified. Previously noted changes of multiple myeloma are again seen. IMPRESSION: 1. Small bilateral pleural effusions noted. Bibasilar airspace opacities likely reflect atelectasis, though pneumonia could have a similar appearance. 2. Trace fluid adjacent to the gallbladder is nonspecific. The gallbladder is grossly unremarkable in appearance. 3. Small left renal cyst noted. 4. Presacral stranding noted. 5. Mild changes of multiple myeloma again noted within the visualized osseous structures. Electronically Signed   By: Garald Balding M.D.   On: 09/15/2015 03:12    Assessment: 40 y.o. 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;        I: CyBorD started January 2016                       (  a) dexamethasone 20 mg/d every TU/WED started 02/04/2014                       (b) bortezomib sQ days 1,4,8,11 of every 21 day cycle started 02/10/2014                       (c) cyclophosphamide 300 mg/M2 IV weekly started 02/13/2014       2: Lenalidomide 35m daily  started 07/26/2014, discontinued December 2016 with progression  (2) 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 The patient is not a transplant candidate for financial reasons  (6) bilateral foot rash: Started on Septra DS and Diflucan 09/03/2014, resolved  (7) Multiple Myeloma relapse in December 2016.                        (a) starting 02/05/15: dexamethasone 273mx2 consecutive days weekly; bortezomib sQ weekly,                        (b) Septra DS twice daily                       (c) acyclovir 40036maily                        (d) bortezomib/ dexamethasone discontinued after 05/06/2015 dose with evidence of progression  (8) started carfilzomib 05/28/2015, given with weekly low dose dexamethasone                       (a) tolerated cyclophosphamide poorly, discontinued after cycle 1                       (b) carfilzomib started 05/28/2015                                             (i) cycle 2 started 07/01/2015                                             (ii) cycle 3 delayed by 1 week due to hospitalization. Started on 08/13/2015.                                             (  iii) cycle 4 started 09/09/2015, that was last dose due to progression                       (c) taking dexamethasone 20 mg every Sunday and Monday--on hold                       (d) lenalidomide discontinued 07/29/2015  (9) on hemodialysis Tu/Th/Sa as of early June 2017  (10) adjuvant radiation completed 07/27/2015  (11)  FUO: workup in process  (12) to start daratumumab as outpatient     Plan: Would suggest PT/OT to mobilize patient as he seems to spend most of the day in bed--he feels dizzy when standing and weak. I suggested he get OOBTC as much as possible during the day.  If we are dealing with "tumor fever" that frequently responds to naproxen (for example 220 mg TID cc). Tumor fever would not explain the elevated WBC however.  Once he is discharged we will start daratumumab for his myeloma. Currently he has an appointment with me at the [facility] 08/24 and hopefully we will be ready to start then.  He appears depressed--though it is difficult to tell as he always says little and never complains. Will start him on low-dose mirtazepine.  Greatly appreciate your help to this patient!  [name], MD 09/16/2015  7:41 AM Medical Oncology and Hematology [facility] [address] [name],  [address] Tel. [phone]    Fax. [phone]

## 2015-09-16 NOTE — Progress Notes (Signed)
Initial Nutrition Assessment  DOCUMENTATION CODES:   Non-severe (moderate) malnutrition in context of chronic illness  INTERVENTION:  Provide Boost Breeze po TID, each supplement provides 250 kcal and 9 grams of protein.  Provide 30 ml Prostat po once daily, each supplement provides 100 kcal and 15 grams of protein.   Provide nourishment snacks (Ordered).  Encourage adequate PO intake.   NUTRITION DIAGNOSIS:   Malnutrition related to chronic illness as evidenced by energy intake < 75% for > or equal to 1 month, moderate depletions of muscle mass, moderate depletion of body fat.  GOAL:   Patient will meet greater than or equal to 90% of their needs  MONITOR:   PO intake, Supplement acceptance, Labs, Weight trends, Skin, I & O's  REASON FOR ASSESSMENT:   Consult  (malnutrition, ESRD)  ASSESSMENT:   40 y.o. male with hx of mult myeloma and ESRD on HD TTS who presented to cancer center for chemo today and was sent to ED for fever and tachycardia.  Pt fatigued during time of visit. Family at bedside. She reports pt has been having a varied appetite at home due to nausea/vomiting which has been ongoing since chemotherapy treatment. Pt reports he has been trying to eat 3 meals a day however has mostly been skipping meals or not eating at meals due to n/v. Meal completion this AM was 25%. Family reports weight loss with usual body weight ~150 lbs which he reported last weighing 2 months ago. Noted HD was started 07/03/2015. Weight loss may be associated with fluid status. Pt reports he has consumed protein shakes (Nepro/Ensure) in the past, however reports it causes diarrhea for him. Pt agreeable to [employer]. RD to order. Will additionally order Prostat to aid in protein needs. Family requested nourishment snacks. Will order.   Nutrition-Focused physical exam completed. Findings are moderate fat depletion, moderate muscle depletion, and mild edema.   Labs and medications reviewed.  Phosphorous low at 2.3.  Diet Order:  Diet regular Room service appropriate? Yes; Fluid consistency: Thin; Fluid restriction: 1200 mL Fluid  Skin:  Reviewed, no issues  Last BM:  8/15  Height:   Ht Readings from Last 1 Encounters:  09/10/15 '5\' 2"'$  (1.575 m)    Weight:   Wt Readings from Last 1 Encounters:  09/15/15 128 lb 8.5 oz (58.3 kg)    Ideal Body Weight:  53.63 kg  BMI:  Body mass index is 23.51 kg/m.  Estimated Nutritional Needs:   Kcal:  1900-2200  Protein:  90-100 grams  Fluid:  1.2 L/day  EDUCATION NEEDS:   No education needs identified at this time  Corrin Parker, MS, RD, LDN Pager # [phone] After hours/ weekend pager # [phone]

## 2015-09-16 NOTE — Progress Notes (Signed)
[facility] Progress Note  Assessment/Plan: 1. FUO culture neg - likely tumor related; new lung masses c/w per progression oer ONC; ID following - suggests removal of dialysis catheter if fevers persist;  HIV and hep panel neg, CMV and EBV pending 2. ESRD - TTS  K 3.9 -HD Thursday - use added Ca  Bath if K is high enough to permit 2 K bath 3. Anemia - hgb 9.6 - transfused 3 units this admit - Aranesp 100 q Thursday 4. Secondary hyperparathyroidism -had very low Ca after denosumab (given for skeletal protection to myeloma pts); on Tums/ Hect/ high Ca bath for the time being and Ca now is about 9 corrected;  5. HTN/volume - net UF 1.8 on Monday and 2.4 on Tuesday with post weight - not done - should have been about 56 based on pre weight 6. Severe PCM - alb 1.2- liberalized diet to regular + fluid restriction - RD consult K and P not an issue 7. MM- chemo plans in future per heme-onc  Myriam Jacobson, PA-C [facility] Beeper [phone] 09/16/2015,9:07 AM  LOS: 6 days   Subjective:   Weak - no specific complaints; wife states he has been eating but hasn't eat ANY  breakfast  Objective Vitals:   09/15/15 1847 09/15/15 2156 09/16/15 0419 09/16/15 0849  BP: 95/66 121/76 133/72 124/82  Pulse: (!) 105 (!) 104 (!) 111 (!) 116  Resp: _0 Temp: 98.4 F (36.9 C) 98.6 F (37 C) 100.3 F (37.9 C) (!) 100.4 F (38 C)  TempSrc: Oral Oral Oral Oral  SpO2: 97% 96% 94% 95%  Weight:      Height:       Physical Exam General: ill appearing/sweaty Heart: tachy reg Lungs:  No rales Abdomen: soft NT Extremities: no LE edema Dialysis Access: left upper AVF + bruit and right IJ  Dialysis Orders: [facility] TTS  4h   58kg  2/3.5 Ca bath  Hep none  L BC AVF (created 07/13/15) Aranesp 200/wk last 8/10, Hect 5 ug Hb 8.2, tsat 23  ferr 2775  Additional Objective Labs: Basic Metabolic Panel:  Recent Labs Lab 09/12/15 0816 09/13/15 0608 09/14/15 0815 09/15/15 0710   NA 131* 133* 135 132*  K 3.7 3.9 3.9 3.9  CL 101 96* 102 97*  CO2 _1 GLUCOSE 82 82 85 87  BUN 24* 15 27* 22*  CREATININE 4.45* 3.44* 5.41* 5.05*  CALCIUM 8.1* 7.7* 7.7* 7.8*  PHOS 3.6  --  3.2 2.3*   Liver Function Tests:  Recent Labs Lab 09/09/15 1344 09/10/15 1424  09/10/15 1711 09/12/15 0816 09/14/15 0815 09/15/15 0710  AST 32 37*  --  36  --   --   --   ALT 17 23  --  23  --   --   --   ALKPHOS 275* 429*  --  322*  --   --   --   BILITOT 0.34 0.32  --  0.3  --   --   --   PROT 6.9 7.1  --  6.3*  --   --   --   ALBUMIN 1.5* 1.5*  < > 1.6* 1.2* 1.2* 1.2*  < > = values in this interval not displayed. No results for input(s): LIPASE, AMYLASE in the last 168 hours. CBC:  Recent Labs Lab 09/09/15 1249 09/10/15 1424  09/10/15 1711 09/11/15 9211 09/12/15 0815  09/13/15 9417 09/14/15 0815 09/15/15 0710  WBC 19.1* 20.7*  < > 18.7* 20.7* 18.0*  --  16.2* 16.8* 19.2*  NEUTROABS 14.7* 14.2*  --  12.5*  --   --   --   --   --   --   HGB 7.6* 7.6*  < > 6.9* 6.1* 6.5*  < > 9.5* 10.2* 9.6*  HCT 24.0* 24.4*  < > 22.2* 20.3* 21.1*  < > 29.5* 32.4* 30.6*  MCV 93.3 95.7  < > 96.9 98.5 95.0  --  91.9 93.9 94.4  PLT 419* 357  < > 349 322 277  --  250 228 206  < > = values in this interval not displayed. Blood Culture    Component Value Date/Time   SDES URINE, RANDOM 09/11/2015 1128   SPECREQUEST NONE 09/11/2015 1128   CULT 1,000 COLONIES/mL INSIGNIFICANT GROWTH (A) 09/11/2015 1128   REPTSTATUS 09/13/2015 FINAL 09/11/2015 1128   Iron Studies: No results for input(s): IRON, TIBC, TRANSFERRIN, FERRITIN in the last 72 hours. Lab Results  Component Value Date   INR 1.23 09/10/2015   INR 1.34 07/03/2015   INR 1.21 12/28/2014   Studies/Results: Ct Abdomen Pelvis Wo Contrast  Result Date: 09/15/2015 CLINICAL DATA:  Fever of unknown origin, acute onset. Initial encounter. EXAM: CT ABDOMEN AND PELVIS WITHOUT CONTRAST TECHNIQUE: Multidetector CT imaging of the abdomen  and pelvis was performed following the standard protocol without IV contrast. COMPARISON:  CT of the chest, abdomen and pelvis performed 06/30/2015 FINDINGS: Small bilateral pleural effusions are noted. Bibasilar opacities likely reflect atelectasis, though pneumonia could have a similar appearance. The liver and spleen are unremarkable in appearance. Trace fluid adjacent to the gallbladder is nonspecific. The gallbladder is within normal limits. The pancreas and adrenal glands are unremarkable. A 1.5 cm left renal cyst is noted. There is no evidence of hydronephrosis. No renal or ureteral stones are seen. No perinephric stranding is appreciated. No free fluid is identified. The small bowel is unremarkable in appearance. The stomach is within normal limits. No acute vascular abnormalities are seen. The appendix is normal in caliber and contains air, without evidence of appendicitis. The colon is grossly unremarkable in appearance. The bladder is decompressed and not well assessed. The prostate remains normal in size. No inguinal lymphadenopathy is seen. Presacral stranding is noted. No acute osseous abnormalities are identified. Previously noted changes of multiple myeloma are again seen. IMPRESSION: 1. Small bilateral pleural effusions noted. Bibasilar airspace opacities likely reflect atelectasis, though pneumonia could have a similar appearance. 2. Trace fluid adjacent to the gallbladder is nonspecific. The gallbladder is grossly unremarkable in appearance. 3. Small left renal cyst noted. 4. Presacral stranding noted. 5. Mild changes of multiple myeloma again noted within the visualized osseous structures. Electronically Signed   By: Garald Balding M.D.   On: 09/15/2015 03:12   Medications:   . sodium chloride   Intravenous Once  . acetaminophen  1,000 mg Oral BID WC  . calcium carbonate  1 tablet Oral TID  . [START ON 09/17/2015] darbepoetin (ARANESP) injection - DIALYSIS  100 mcg Intravenous Q Thu-HD  .  doxercalciferol  5 mcg Intravenous Q T,Th,Sa-HD  . metoprolol tartrate  50 mg Oral BID  . mirtazapine  7.5 mg Oral QHS  . multivitamin  1 tablet Oral QHS  . polyethylene glycol  17 g Oral Daily  . sodium chloride flush  3 mL Intravenous Q12H  . traMADol  50 mg Oral BID  . valACYclovir  500 mg Oral Daily

## 2015-09-16 NOTE — Progress Notes (Signed)
Patient ID: Charles [name], male   DOB: [birth year], 40 y.o.   MRN: [id number]          [facility] for Infectious Disease    Date of Admission:  09/10/2015     Principal Problem:   Sepsis [facility]) Active Problems:   ESRD (end stage renal disease) ([place])   Hypoalbuminemia due to protein-calorie malnutrition ([place])   Multiple myeloma not having achieved remission ([place])   Fever   Anemia   Essential hypertension   Infection of hemodialysis catheter (H[facility])   Malnutrition of moderate degree   . sodium chloride   Intravenous Once  . acetaminophen  1,000 mg Oral BID WC  . calcium carbonate  1 tablet Oral TID  . [START ON 09/17/2015] darbepoetin (ARANESP) injection - DIALYSIS  100 mcg Intravenous Q Thu-HD  . doxercalciferol  5 mcg Intravenous Q T,Th,Sa-HD  . feeding supplement  1 Container Oral TID BM  . feeding supplement (PRO-STAT SUGAR FREE 64)  30 mL Oral Q1500  . heparin subcutaneous  5,000 Units Subcutaneous Q8H  . metoprolol tartrate  50 mg Oral BID  . mirtazapine  7.5 mg Oral QHS  . morphine  15 mg Oral Q12H  . multivitamin  1 tablet Oral QHS  . polyethylene glycol  17 g Oral Daily  . senna-docusate  2 tablet Oral BID  . sodium chloride flush  3 mL Intravenous Q12H  . traMADol  50 mg Oral BID  . valACYclovir  500 mg Oral Daily    SUBJECTIVE: He states that he is not feeling well.  Review of Systems: Review of Systems  Unable to perform ROS: Acuity of condition    Past Medical History:  Diagnosis Date  . ESRD (end stage renal disease) on dialysis ([place])    [address] [facility] S (08/06/2015)  . GERD (gastroesophageal reflux disease)   . Headache    "monthly" (08/06/2015)  . History of blood transfusion 07/2015   "related to his kidneys"  . Hypertension   . 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 Kidney stones   . Lytic bone lesions on xray   . Neuromuscular disorder ([place])    states both arms are shaking a lot    Social History  Substance Use Topics  . Smoking status: Never  Smoker  . Smokeless tobacco: Never Used  . Alcohol use No    Family History  Problem Relation Age of Onset  . Diabetes Father    Allergies  Allergen Reactions  . Ciprofloxacin Rash  . Mobic [Meloxicam] Rash    OBJECTIVE: Vitals:   09/15/15 2156 09/16/15 0419 09/16/15 0849 09/16/15 1644  BP: 121/76 133/72 124/82 139/82  Pulse: (!) 104 (!) 111 (!) 116 (!) 121  Resp: _0 Temp: 98.6 F (37 C) 100.3 F (37.9 C) (!) 100.4 F (38 C) (!) 102.9 F (39.4 C)  TempSrc: Oral Oral Oral Oral  SpO2: 96% 94% 95% 97%  Weight:      Height:       Body mass index is 23.51 kg/m.  Physical Exam  Constitutional:  He does not look well. He is very weak. He answers questions slowly and with one-word answers only.  HENT:  Mouth/Throat: No oropharyngeal exudate.  Eyes: Conjunctivae are normal.  Neck: Neck supple.  Cardiovascular: Regular rhythm.   No murmur heard. Tachycardic.  Pulmonary/Chest: Effort normal and breath sounds normal. He has no wheezes. He has no rales.  Abdominal: Soft. Bowel sounds are normal. There  is no tenderness.  Musculoskeletal: Normal range of motion. He exhibits no edema or tenderness.  Neurological: He is alert.  Skin: No rash noted.    Lab Results Lab Results  Component Value Date   WBC 19.2 (H) 09/15/2015   HGB 9.6 (L) 09/15/2015   HCT 30.6 (L) 09/15/2015   MCV 94.4 09/15/2015   PLT 206 09/15/2015    Lab Results  Component Value Date   CREATININE 5.05 (H) 09/15/2015   BUN 22 (H) 09/15/2015   NA 132 (L) 09/15/2015   K 3.9 09/15/2015   CL 97 (L) 09/15/2015   CO2 24 09/15/2015    Lab Results  Component Value Date   ALT 23 09/10/2015   AST 36 09/10/2015   ALKPHOS 322 (H) 09/10/2015   BILITOT 0.3 09/10/2015     Microbiology: Recent Results (from the past 240 hour(s))  TECHNOLOGIST REVIEW     Status: None   Collection Time: 09/09/15 12:49 PM  Result Value Ref Range Status   Technologist Review Rare meta  Final  TECHNOLOGIST REVIEW      Status: None   Collection Time: 09/10/15  2:24 PM  Result Value Ref Range Status   Technologist Review   Final    1% nrbc, Variant lymphs, some appear plasmacytoid, present  Blood Culture (routine x 2)     Status: None   Collection Time: 09/10/15  4:40 PM  Result Value Ref Range Status   Specimen Description BLOOD RIGHT ANTECUBITAL  Final   Special Requests BOTTLES DRAWN AEROBIC AND ANAEROBIC 5CC  Final   Culture NO GROWTH 5 DAYS  Final   Report Status 09/15/2015 FINAL  Final  Blood Culture (routine x 2)     Status: None   Collection Time: 09/10/15  4:52 PM  Result Value Ref Range Status   Specimen Description BLOOD RIGHT HAND  Final   Special Requests BOTTLES DRAWN AEROBIC ONLY 10CC  Final   Culture NO GROWTH 5 DAYS  Final   Report Status 09/15/2015 FINAL  Final  MRSA PCR Screening     Status: None   Collection Time: 09/10/15 11:09 PM  Result Value Ref Range Status   MRSA by PCR NEGATIVE NEGATIVE Final    Comment:        The GeneXpert MRSA Assay (FDA approved for NASAL specimens only), is one component of a comprehensive MRSA colonization surveillance program. It is not intended to diagnose MRSA infection nor to guide or monitor treatment for MRSA infections.   Culture, Urine     Status: Abnormal   Collection Time: 09/11/15 11:28 AM  Result Value Ref Range Status   Specimen Description URINE, RANDOM  Final   Special Requests NONE  Final   Culture 1,000 COLONIES/mL INSIGNIFICANT GROWTH (A)  Final   Report Status 09/13/2015 FINAL  Final     ASSESSMENT: He continues to have high fevers but no firm evidence of infection. Blood cultures are negative and CT scans of his chest, abdomen and pelvis showed no acute changes. He does have known findings of multiple myeloma. This is very similar to his admission in May of this year when he had fevers without evidence of infection. The fevers eventually resolved. I favor continued observation off of antibiotics  PLAN: 1. Continue  observation off of antibiotics  Michel Bickers, MD [facility] [phone] pager   [phone] cell 09/16/2015, 5:47 PM

## 2015-09-16 NOTE — Progress Notes (Addendum)
Patients Temp 102.9 MD aware. Tylenol given.  18:00 Patients Temp 103.2 Dr. Sloan Leiter aware. Recheck at 1900. Ice packs.  19:00 Patients Temp 103.0 . Call out to Dr. Sloan Leiter.

## 2015-09-17 ENCOUNTER — Other Ambulatory Visit: Payer: Self-pay

## 2015-09-17 ENCOUNTER — Encounter (HOSPITAL_COMMUNITY): Payer: Self-pay

## 2015-09-17 ENCOUNTER — Inpatient Hospital Stay (HOSPITAL_COMMUNITY): Payer: Self-pay

## 2015-09-17 ENCOUNTER — Ambulatory Visit: Payer: Self-pay

## 2015-09-17 DIAGNOSIS — E440 Moderate protein-calorie malnutrition: Secondary | ICD-10-CM

## 2015-09-17 HISTORY — PX: IR GENERIC HISTORICAL: IMG1180011

## 2015-09-17 LAB — RENAL FUNCTION PANEL
Albumin: 1.4 g/dL — ABNORMAL LOW (ref 3.5–5.0)
Anion gap: 12 (ref 5–15)
BUN: 27 mg/dL — AB (ref 6–20)
CHLORIDE: 99 mmol/L — AB (ref 101–111)
CO2: 26 mmol/L (ref 22–32)
CREATININE: 6.03 mg/dL — AB (ref 0.61–1.24)
Calcium: 8.8 mg/dL — ABNORMAL LOW (ref 8.9–10.3)
GFR calc Af Amer: 12 mL/min — ABNORMAL LOW (ref >60)
GFR calc non Af Amer: 11 mL/min — ABNORMAL LOW (ref >60)
GLUCOSE: 110 mg/dL — AB (ref 65–99)
POTASSIUM: 3.9 mmol/L (ref 3.5–5.1)
Phosphorus: 4.7 mg/dL — ABNORMAL HIGH (ref 2.5–4.6)
Sodium: 137 mmol/L (ref 135–145)

## 2015-09-17 LAB — CBC
HEMATOCRIT: 35.5 % — AB (ref 39.0–52.0)
Hemoglobin: 11.2 g/dL — ABNORMAL LOW (ref 13.0–17.0)
MCH: 30.2 pg (ref 26.0–34.0)
MCHC: 31.5 g/dL (ref 30.0–36.0)
MCV: 95.7 fL (ref 78.0–100.0)
PLATELETS: 183 10*3/uL (ref 150–400)
RBC: 3.71 MIL/uL — ABNORMAL LOW (ref 4.22–5.81)
RDW: 18.7 % — AB (ref 11.5–15.5)
WBC: 21.1 10*3/uL — ABNORMAL HIGH (ref 4.0–10.5)

## 2015-09-17 LAB — GLUCOSE, CAPILLARY: Glucose-Capillary: 99 mg/dL (ref 65–99)

## 2015-09-17 LAB — CMV DNA, QUANTITATIVE, PCR
CMV DNA Quant: POSITIVE IU/mL
Log10 CMV Qn DNA Pl: UNDETERMINED log10 IU/mL

## 2015-09-17 MED ORDER — LIDOCAINE HCL 1 % IJ SOLN
INTRAMUSCULAR | Status: AC
  Filled 2015-09-17: qty 20

## 2015-09-17 MED ORDER — CHLORHEXIDINE GLUCONATE 4 % EX LIQD
CUTANEOUS | Status: AC
  Filled 2015-09-17: qty 15

## 2015-09-17 MED ORDER — DOXERCALCIFEROL 4 MCG/2ML IV SOLN
INTRAVENOUS | Status: AC
  Filled 2015-09-17: qty 4

## 2015-09-17 MED ORDER — DARBEPOETIN ALFA 100 MCG/0.5ML IJ SOSY
PREFILLED_SYRINGE | INTRAMUSCULAR | Status: AC
  Filled 2015-09-17: qty 0.5

## 2015-09-17 NOTE — Progress Notes (Signed)
Patient ID: Charles [name], male   DOB: 07-19-1975, 40 y.o.   MRN: [id number]          [facility] for Infectious Disease    Date of Admission:  09/10/2015     I saw Charles [name] while he was on hemodialysis this morning. He was not febrile but still was not feeling well. He has been tachycardic and tachypneic. There is still no firm evidence that his fevers are due to infection. I discussed his difficult case with Dr. Jana Hakim last night and we were in agreement with starting him on Naprosyn for probable tumor fevers.     Michel Bickers, MD [facility] for [facility] [name] [phone] pager   [phone] cell 02/03/2015, 1:32 PM

## 2015-09-17 NOTE — Procedures (Signed)
Successful removal of tunneled (R)IJ HD catheter. Tip sent for culture. No complications.  Ascencion Dike PA-C Interventional Radiology 09/17/2015 2:31 PM

## 2015-09-17 NOTE — Procedures (Signed)
I was present at this dialysis session. I have reviewed the session itself and made appropriate changes.   Cont to spike high fevers w/ negative ID w/u.  FUO, LE dopplers pending.  Had straightforward cannulation of AVF 17g x2 for the first time.  Though atypical, will go ahead and have TDC removed and procede with cannulation protocol of AVF w/o TDC.    Filed Weights   09/15/15 0721 09/16/15 2020 09/17/15 0700  Weight: 58.3 kg (128 lb 8.5 oz) 56.9 kg (125 lb 8 oz) 55 kg (121 lb 4.1 oz)     Recent Labs Lab 09/17/15 0539  NA 137  K 3.9  CL 99*  CO2 26  GLUCOSE 110*  BUN 27*  CREATININE 6.03*  CALCIUM 8.8*  PHOS 4.7*     Recent Labs Lab 09/10/15 1424 09/10/15 1711  09/14/15 0815 09/15/15 0710 09/17/15 0539  WBC 20.7* 18.7*  < > 16.8* 19.2* 21.1*  NEUTROABS 14.2* 12.5*  --   --   --   --   HGB 7.6* 6.9*  < > 10.2* 9.6* 11.2*  HCT 24.4* 22.2*  < > 32.4* 30.6* 35.5*  MCV 95.7 96.9  < > 93.9 94.4 95.7  PLT 357 349  < > 228 206 183  < > = values in this interval not displayed.  Scheduled Meds: . sodium chloride   Intravenous Once  . acetaminophen  1,000 mg Oral BID WC  . calcium carbonate  1 tablet Oral TID  . Darbepoetin Alfa      . darbepoetin (ARANESP) injection - DIALYSIS  100 mcg Intravenous Q Thu-HD  . doxercalciferol      . doxercalciferol  5 mcg Intravenous Q T,Th,Sa-HD  . feeding supplement  1 Container Oral TID BM  . feeding supplement (PRO-STAT SUGAR FREE 64)  30 mL Oral Q1500  . heparin subcutaneous  5,000 Units Subcutaneous Q8H  . metoprolol tartrate  50 mg Oral BID  . mirtazapine  7.5 mg Oral QHS  . morphine  15 mg Oral Q12H  . multivitamin  1 tablet Oral QHS  . naproxen  250 mg Oral TID WC  . polyethylene glycol  17 g Oral Daily  . senna-docusate  2 tablet Oral BID  . sodium chloride flush  3 mL Intravenous Q12H  . traMADol  50 mg Oral BID  . valACYclovir  500 mg Oral Daily   Continuous Infusions:  PRN Meds:.sodium chloride, acetaminophen **OR**  acetaminophen, HYDROmorphone (DILAUDID) injection, LORazepam, morphine injection, ondansetron **OR** ondansetron (ZOFRAN) IV, sodium chloride flush   Pearson Grippe  MD 09/17/2015, 8:12 AM

## 2015-09-17 NOTE — Progress Notes (Signed)
PROGRESS NOTE        PATIENT DETAILS Name: Charles [name] Age: 40 y.o. Sex: male Date of Birth: 1975-06-01 Admit Date: 09/10/2015 Admitting Physician Roney Jaffe, MD [id number], Gabrielle Dare, MD  Brief Narrative: [name] with history of multiple myeloma on outpatient chemotherapy, ESRD on hemodialysis (TTS) admitted for evaluation of fever of unknown origin. Blood cultures negative so far. CT of the chest, along with abdomen and pelvis negative for any infection. Thought to have  tumor fever at this time-has no foci of infection currently apparent  Subjective: Seen in hemodialysis, tachycardic-Continues to have fever with sweats.  Assessment/Plan: Principal Problem: Sepsis due to fever of unknown origin: Sepsis pathophysiology has resolved, continues to be mildly tachycardic at times. Blood cultures negative and no foci of infection evident on CT chest and abdomen. Previously was empirically on vancomycin and Zosyn, no longer on antibiotics. Infectious disease following.  Active Problems: FUO: No cause of his fever identified yet-cultures are negative, no infective foci seen on CT of the abdomen and chest. HIV and acute hepatitis panel negative. CMV/EBV serology pending. Could be tumor fever, but has significant leukocytosis as well. He has not been started on naproxen to see if this would help improve his fever. Awaiting lower extremity Doppler to make sure he does not have a DVT. He does have a hemodialysis catheter, since is fistula is nonfunctional and matured, nephrology plans to remove hemodialysis catheter today to see if fever will improve-but blood cultures are negative so far. ID continues to follow, await further recommendations.   Mild sinus tachycardia: Suspect secondary to ongoing fever, TSH within normal limits.  No chest pain or shortness of breath to indicate venous thromboembolism, however given unexplained fever-we will go  ahead and get a lower extremity Doppler ultrasound to rule out DVT.  ESRD: On hemodialysis-TTS-nephrology following  Anemia: Multifactorial-likely secondary to acute illness-with underlying anemia of chronic disease from malignancy and ESRD. Hemoglobin stable after 1 unit of transfusion. Follow.  History of multiple myeloma with pleural-based lung metastases: Followed by oncology in the outpatient setting-chemotherapy currently on hold  Hypertension: Controlled with metoprolol  Chronic pain syndrome: Appears to be on narcotics chronically-MS Contin has been resumed. On bowel regimen with MiraLAX and Senokot  Malnutrition of moderate degree: Continue supplements  DVT Prophylaxis: Prophylactic heparin  Code Status: Full code   Family Communication: None at bedside  Disposition Plan: Remain inpatient-requires several more days of hospitalization prior to discharge  Antimicrobial agents: IV vancomycin 8/10>> 8/14 IV Zosyn 8/10>> 8/15  Procedures: None  CONSULTS:  ID and nephrology   Oncology  Time spent: 25 minutes-Greater than 50% of this time was spent in counseling, explanation of diagnosis, planning of further management, and coordination of care.  MEDICATIONS: Anti-infectives    Start     Dose/Rate Route Frequency Ordered Stop   09/14/15 1300  vancomycin (VANCOCIN) IVPB 750 mg/150 ml premix  Status:  Discontinued     750 mg 150 mL/hr over 60 Minutes Intravenous  Once 09/14/15 1250 09/14/15 1506   09/12/15 1400  valACYclovir (VALTREX) tablet 500 mg     500 mg Oral Daily 09/12/15 1104     09/12/15 1200  vancomycin (VANCOCIN) 500 mg in sodium chloride 0.9 % 100 mL IVPB  Status:  Discontinued     500 mg 100 mL/hr over 60 Minutes  Intravenous Every T-Th-Sa (Hemodialysis) 09/10/15 1959 09/12/15 0806   09/12/15 1200  vancomycin (VANCOCIN) IVPB 750 mg/150 ml premix  Status:  Discontinued     750 mg 150 mL/hr over 60 Minutes Intravenous Every T-Th-Sa (Hemodialysis)  09/12/15 0806 09/14/15 1506   09/12/15 1017  Vancomycin (VANCOCIN) 750-5 MG/150ML-% IVPB    Comments:  [name]   : cabinet override      09/12/15 1017 09/12/15 1032   09/11/15 0600  piperacillin-tazobactam (ZOSYN) IVPB 3.375 g  Status:  Discontinued     3.375 g 12.5 mL/hr over 240 Minutes Intravenous Every 12 hours 09/10/15 1959 09/15/15 1027   09/11/15 0530  vancomycin (VANCOCIN) 500 mg in sodium chloride 0.9 % 100 mL IVPB  Status:  Discontinued     500 mg 100 mL/hr over 60 Minutes Intravenous Every 12 hours 09/10/15 1656 09/10/15 1950   09/10/15 2330  piperacillin-tazobactam (ZOSYN) IVPB 3.375 g  Status:  Discontinued     3.375 g 12.5 mL/hr over 240 Minutes Intravenous Every 8 hours 09/10/15 1656 09/10/15 1950   09/10/15 1700  piperacillin-tazobactam (ZOSYN) IVPB 3.375 g  Status:  Discontinued     3.375 g 100 mL/hr over 30 Minutes Intravenous  Once 09/10/15 1656 09/10/15 1701   09/10/15 1700  vancomycin (VANCOCIN) IVPB 1000 mg/200 mL premix     1,000 mg 200 mL/hr over 60 Minutes Intravenous  Once 09/10/15 1656 09/10/15 1801   09/10/15 1645  piperacillin-tazobactam (ZOSYN) IVPB 3.375 g     3.375 g 100 mL/hr over 30 Minutes Intravenous  Once 09/10/15 1637 09/10/15 1721   09/10/15 1645  vancomycin (VANCOCIN) IVPB 1000 mg/200 mL premix  Status:  Discontinued     1,000 mg 200 mL/hr over 60 Minutes Intravenous  Once 09/10/15 1637 09/10/15 1701      Scheduled Meds: . sodium chloride   Intravenous Once  . acetaminophen  1,000 mg Oral BID WC  . calcium carbonate  1 tablet Oral TID  . chlorhexidine      . darbepoetin (ARANESP) injection - DIALYSIS  100 mcg Intravenous Q Thu-HD  . doxercalciferol  5 mcg Intravenous Q T,Th,Sa-HD  . feeding supplement  1 Container Oral TID BM  . feeding supplement (PRO-STAT SUGAR FREE 64)  30 mL Oral Q1500  . heparin subcutaneous  5,000 Units Subcutaneous Q8H  . lidocaine      . metoprolol tartrate  50 mg Oral BID  . mirtazapine  7.5 mg Oral QHS    . morphine  15 mg Oral Q12H  . multivitamin  1 tablet Oral QHS  . naproxen  250 mg Oral TID WC  . polyethylene glycol  17 g Oral Daily  . senna-docusate  2 tablet Oral BID  . sodium chloride flush  3 mL Intravenous Q12H  . traMADol  50 mg Oral BID  . valACYclovir  500 mg Oral Daily   Continuous Infusions:  PRN Meds:.sodium chloride, acetaminophen **OR** acetaminophen, HYDROmorphone (DILAUDID) injection, LORazepam, morphine injection, ondansetron **OR** ondansetron (ZOFRAN) IV, sodium chloride flush   PHYSICAL EXAM: Vital signs: Vitals:   09/17/15 1030 09/17/15 1053 09/17/15 1111 09/17/15 1223  BP: (!) 123/92 (!) 131/105 117/89 108/76  Pulse: (!) 140 (!) 136 (!) 122 (!) 124  Resp: 18 (!) 21 16 20   Temp:   98.5 F (36.9 C) 98.4 F (36.9 C)  TempSrc:   Oral Oral  SpO2: 100% 100% 100% 100%  Weight:      Height:       [employer]  09/15/15 0721 09/16/15 2020 09/17/15 0700  Weight: 58.3 kg (128 lb 8.5 oz) 56.9 kg (125 lb 8 oz) 55 kg (121 lb 4.1 oz)   Body mass index is 22.18 kg/m.   Gen Exam: Awake, alert-getting hemodialysis this morning. Looks acutely ill. But not in any distress.  Neck: Supple, No JVD.   Chest: B/L Clear.  CVS: S1 S2 Regular, tachycardic Abdomen: soft, BS +, non tender, non distended.  Extremities: no edema, lower extremities warm to touch. Neurologic: Non Focal.   Skin: No Rash or lesions   Wounds: N/A.   I have personally reviewed following labs and imaging studies  LABORATORY DATA: CBC:  Recent Labs Lab 09/10/15 1424 09/10/15 1711  09/12/15 0815 09/12/15 1511 09/13/15 0608 09/14/15 0815 09/15/15 0710 09/17/15 0539  WBC 20.7* 18.7*  < > 18.0*  --  16.2* 16.8* 19.2* 21.1*  NEUTROABS 14.2* 12.5*  --   --   --   --   --   --   --   HGB 7.6* 6.9*  < > 6.5* 9.9* 9.5* 10.2* 9.6* 11.2*  HCT 24.4* 22.2*  < > 21.1* 29.9* 29.5* 32.4* 30.6* 35.5*  MCV 95.7 96.9  < > 95.0  --  91.9 93.9 94.4 95.7  PLT 357 349  < > 277  --  250 228 206 183  <  > = values in this interval not displayed.  Basic Metabolic Panel:  Recent Labs Lab 09/10/15 2200  09/12/15 0816 09/13/15 0608 09/14/15 0815 09/15/15 0710 09/17/15 0539  NA  --   < > 131* 133* 135 132* 137  K  --   < > 3.7 3.9 3.9 3.9 3.9  CL  --   < > 101 96* 102 97* 99*  CO2  --   < > 22 24 25 24 26   GLUCOSE  --   < > 82 82 85 87 110*  BUN  --   < > 24* 15 27* 22* 27*  CREATININE  --   < > 4.45* 3.44* 5.41* 5.05* 6.03*  CALCIUM  --   < > 8.1* 7.7* 7.7* 7.8* 8.8*  PHOS 2.2*  --  3.6  --  3.2 2.3* 4.7*  < > = values in this interval not displayed.  GFR: Estimated Creatinine Clearance: 12.6 mL/min (by C-G formula based on SCr of 6.03 mg/dL).  Liver Function Tests:  Recent Labs Lab 09/10/15 1424 09/10/15 1711 09/12/15 0816 09/14/15 0815 09/15/15 0710 09/17/15 0539  AST 37* 36  --   --   --   --   ALT 23 23  --   --   --   --   ALKPHOS 429* 322*  --   --   --   --   BILITOT 0.32 0.3  --   --   --   --   PROT 7.1 6.3*  --   --   --   --   ALBUMIN 1.5* 1.6* 1.2* 1.2* 1.2* 1.4*   No results for input(s): LIPASE, AMYLASE in the last 168 hours. No results for input(s): AMMONIA in the last 168 hours.  Coagulation Profile:  Recent Labs Lab 09/10/15 1711  INR 1.23    Cardiac Enzymes: No results for input(s): CKTOTAL, CKMB, CKMBINDEX, TROPONINI in the last 168 hours.  BNP (last 3 results) No results for input(s): PROBNP in the last 8760 hours.  HbA1C: No results for input(s): HGBA1C in the last 72 hours.  CBG:  Recent Labs Lab 09/17/15  [place]    Lipid Profile: No results for input(s): CHOL, HDL, LDLCALC, TRIG, CHOLHDL, LDLDIRECT in the last 72 hours.  Thyroid Function Tests:  Recent Labs  09/16/15 1825  TSH 3.478    Anemia Panel: No results for input(s): VITAMINB12, FOLATE, FERRITIN, TIBC, IRON, RETICCTPCT in the last 72 hours.  Urine analysis:    Component Value Date/Time   COLORURINE BROWN (A) 09/11/2015 2252   APPEARANCEUR  TURBID (A) 09/11/2015 2252   LABSPEC 1.016 09/11/2015 2252   LABSPEC 1.030 01/22/2015 1018   PHURINE 7.5 09/11/2015 2252   GLUCOSEU 100 (A) 09/11/2015 2252   GLUCOSEU Negative 01/22/2015 1018   HGBUR LARGE (A) 09/11/2015 2252   BILIRUBINUR MODERATE (A) 09/11/2015 2252   BILIRUBINUR Negative 01/22/2015 1018   KETONESUR 15 (A) 09/11/2015 2252   PROTEINUR >300 (A) 09/11/2015 2252   UROBILINOGEN 0.2 01/22/2015 1018   NITRITE NEGATIVE 09/11/2015 2252   LEUKOCYTESUR MODERATE (A) 09/11/2015 2252   LEUKOCYTESUR Color Interference due to blood in urine 01/22/2015 1018    Sepsis Labs: Lactic Acid, Venous    Component Value Date/Time   LATICACIDVEN 1.4 09/12/2015 1254    MICROBIOLOGY: Recent Results (from the past 240 hour(s))  TECHNOLOGIST REVIEW     Status: None   Collection Time: 09/09/15 12:49 PM  Result Value Ref Range Status   Technologist Review Rare meta  Final  TECHNOLOGIST REVIEW     Status: None   Collection Time: 09/10/15  2:24 PM  Result Value Ref Range Status   Technologist Review   Final    1% nrbc, Variant lymphs, some appear plasmacytoid, present  Blood Culture (routine x 2)     Status: None   Collection Time: 09/10/15  4:40 PM  Result Value Ref Range Status   Specimen Description BLOOD RIGHT ANTECUBITAL  Final   Special Requests BOTTLES DRAWN AEROBIC AND ANAEROBIC 5CC  Final   Culture NO GROWTH 5 DAYS  Final   Report Status 09/15/2015 FINAL  Final  Blood Culture (routine x 2)     Status: None   Collection Time: 09/10/15  4:52 PM  Result Value Ref Range Status   Specimen Description BLOOD RIGHT HAND  Final   Special Requests BOTTLES DRAWN AEROBIC ONLY 10CC  Final   Culture NO GROWTH 5 DAYS  Final   Report Status 09/15/2015 FINAL  Final  MRSA PCR Screening     Status: None   Collection Time: 09/10/15 11:09 PM  Result Value Ref Range Status   MRSA by PCR NEGATIVE NEGATIVE Final    Comment:        The GeneXpert MRSA Assay (FDA approved for NASAL  specimens only), is one component of a comprehensive MRSA colonization surveillance program. It is not intended to diagnose MRSA infection nor to guide or monitor treatment for MRSA infections.   Culture, Urine     Status: Abnormal   Collection Time: 09/11/15 11:28 AM  Result Value Ref Range Status   Specimen Description URINE, RANDOM  Final   Special Requests NONE  Final   Culture 1,000 COLONIES/mL INSIGNIFICANT GROWTH (A)  Final   Report Status 09/13/2015 FINAL  Final    RADIOLOGY STUDIES/RESULTS: Ct Abdomen Pelvis Wo Contrast  Result Date: 09/15/2015 CLINICAL DATA:  Fever of unknown origin, acute onset. Initial encounter. EXAM: CT ABDOMEN AND PELVIS WITHOUT CONTRAST TECHNIQUE: Multidetector CT imaging of the abdomen and pelvis was performed following the standard protocol without IV contrast. COMPARISON:  CT of the chest, abdomen and  pelvis performed 06/30/2015 FINDINGS: Small bilateral pleural effusions are noted. Bibasilar opacities likely reflect atelectasis, though pneumonia could have a similar appearance. The liver and spleen are unremarkable in appearance. Trace fluid adjacent to the gallbladder is nonspecific. The gallbladder is within normal limits. The pancreas and adrenal glands are unremarkable. A 1.5 cm left renal cyst is noted. There is no evidence of hydronephrosis. No renal or ureteral stones are seen. No perinephric stranding is appreciated. No free fluid is identified. The small bowel is unremarkable in appearance. The stomach is within normal limits. No acute vascular abnormalities are seen. The appendix is normal in caliber and contains air, without evidence of appendicitis. The colon is grossly unremarkable in appearance. The bladder is decompressed and not well assessed. The prostate remains normal in size. No inguinal lymphadenopathy is seen. Presacral stranding is noted. No acute osseous abnormalities are identified. Previously noted changes of multiple myeloma are  again seen. IMPRESSION: 1. Small bilateral pleural effusions noted. Bibasilar airspace opacities likely reflect atelectasis, though pneumonia could have a similar appearance. 2. Trace fluid adjacent to the gallbladder is nonspecific. The gallbladder is grossly unremarkable in appearance. 3. Small left renal cyst noted. 4. Presacral stranding noted. 5. Mild changes of multiple myeloma again noted within the visualized osseous structures. Electronically Signed   By: Garald Balding M.D.   On: 09/15/2015 03:12   Dg Chest 2 View  Result Date: 09/10/2015 CLINICAL DATA:  Fever and weakness. EXAM: CHEST  2 VIEW COMPARISON:  Multiple chest x-rays since July 06, 2015 FINDINGS: No pneumothorax. Bilateral pleural effusions and underlying opacities are identified. A rounded density in the lateral left lower lung measures up to 3.2 cm and is masslike in appearance. However, this was not present on multiple recent chest x-rays. No other nodules or masses. No overt edema. A right dialysis catheter is again identified. The cardiomediastinal silhouette is stable. IMPRESSION: 1. Rounded masslike low-attenuation density in the lateral left lung not seen on multiple recent chest x-rays. This may represent some loculated fluid. Recommend short-term follow-up to ensure resolution. 2. Bilateral pleural effusions with underlying atelectasis. 3. No other acute abnormalities. Electronically Signed   By: Dorise Bullion III M.D   On: 09/10/2015 17:28   Ct Chest Wo Contrast  Result Date: 09/12/2015 CLINICAL DATA:  Evaluate for mass within the chest. EXAM: CT CHEST WITHOUT CONTRAST TECHNIQUE: Multidetector CT imaging of the chest was performed following the standard protocol without IV contrast. COMPARISON:  Chest radiograph 09/10/2015; chest CT 06/30/2015 FINDINGS: Cardiovascular: Heart is enlarged. Aorta and main pulmonary artery normal in caliber. Central venous catheter tip terminates at the superior cavoatrial junction.  Mediastinum/Nodes: No axillary, mediastinal or hilar lymphadenopathy. Mild heterogeneity of the thyroid, unchanged. Esophagus is unremarkable. Lungs/Pleura: Central airways are patent. Subpleural ground-glass and consolidative opacities within the right and left lower lobes. Moderate layering bilateral pleural effusions, increased from prior. No pneumothorax. Motion artifact limits evaluation. There are multiple pleural-based masses demonstrated within both the left and right hemi thorax. A few of these have increased in size measuring up to 2.4 x 1.2 cm within the anterior left hemi thorax (image 66; series 2) and up to 1.3 x 2.7 cm in the lateral left hemi thorax (image 74; series 2). These are associated with underlying osseous destruction of the adjacent ribs including the anterior left second rib, posterior left third rib, the lateral left fifth rib and posterior seventh and ninth ribs. Upper Abdomen: Unremarkable Musculoskeletal: Re- demonstrated lytic lesions within the sternum. Additionally there  multiple osseous destructive lesions involving predominantly the left ribs as described above associated with soft tissue masses, most compatible patient's history of multiple myeloma. There are few patchy lucencies throughout the thoracic spine, most notable within the left T4 transverse process. IMPRESSION: Interval increase in size of multiple predominately left-sided pleural based soft tissue masses with associated underlying osseous destruction, most compatible patient's history of multiple myeloma. Persistent lytic lesion within the sternum/manubrium. Moderate layering bilateral pleural effusions, increased from prior, with underlying pulmonary consolidation most compatible with associated atelectasis. Electronically Signed   By: Lovey Newcomer M.D.   On: 09/12/2015 07:56     LOS: 7 days   Oren Binet, MD  [facility] Pager:[phone]  If 7PM-7AM, please contact  night-coverage [email or web address] Password [email or web address] 09/17/2015, 1:43 PM

## 2015-09-18 ENCOUNTER — Encounter (HOSPITAL_COMMUNITY): Payer: Self-pay | Admitting: Radiology

## 2015-09-18 ENCOUNTER — Inpatient Hospital Stay (HOSPITAL_COMMUNITY): Payer: Self-pay

## 2015-09-18 DIAGNOSIS — R609 Edema, unspecified: Secondary | ICD-10-CM

## 2015-09-18 LAB — CBC
HCT: 29.5 % — ABNORMAL LOW (ref 39.0–52.0)
Hemoglobin: 9.3 g/dL — ABNORMAL LOW (ref 13.0–17.0)
MCH: 29.9 pg (ref 26.0–34.0)
MCHC: 31.5 g/dL (ref 30.0–36.0)
MCV: 94.9 fL (ref 78.0–100.0)
PLATELETS: 171 10*3/uL (ref 150–400)
RBC: 3.11 MIL/uL — ABNORMAL LOW (ref 4.22–5.81)
RDW: 19 % — AB (ref 11.5–15.5)
WBC: 17.1 10*3/uL — AB (ref 4.0–10.5)

## 2015-09-18 LAB — RENAL FUNCTION PANEL
Albumin: 1.4 g/dL — ABNORMAL LOW (ref 3.5–5.0)
Anion gap: 13 (ref 5–15)
BUN: 32 mg/dL — AB (ref 6–20)
CHLORIDE: 99 mmol/L — AB (ref 101–111)
CO2: 26 mmol/L (ref 22–32)
CREATININE: 4.76 mg/dL — AB (ref 0.61–1.24)
Calcium: 10.1 mg/dL (ref 8.9–10.3)
GFR calc Af Amer: 16 mL/min — ABNORMAL LOW (ref >60)
GFR, EST NON AFRICAN AMERICAN: 14 mL/min — AB (ref >60)
GLUCOSE: 83 mg/dL (ref 65–99)
Phosphorus: 4.9 mg/dL — ABNORMAL HIGH (ref 2.5–4.6)
Potassium: 5 mmol/L (ref 3.5–5.1)
Sodium: 138 mmol/L (ref 135–145)

## 2015-09-18 NOTE — Evaluation (Addendum)
Physical Therapy Evaluation Patient Details Name: Charles [name] MRN: [id number] DOB: 02-12-1975 Today's Date: 09/18/2015   History of Present Illness  Charles [name] is a 40 y.o. male with hx of HTN, mult myeloma and ESRD on HD TTS who presented to cancer center for chemo today and was sent to ED for fever and tachycardia    Clinical Impression  Patient presents with problems listed below.  Will benefit from acute PT to maximize functional mobility prior to d/c.  Patient's wife assisted with translation as needed.  Patient with general weakness and decreased cognition impacting functional mobility.  Patient with minimal interaction with PT during session.  Patient unable to state wife's name.  Patient with "empty stare", requiring tactile cues to attend to therapist/wife.  Also noted jerking movement of trunk in sitting, causing trunk to jerk forward.  Required mod to max assist for all bed mobility and sitting balance.  Unable to stand.  Feel patient would benefit from and inpatient setting for continued therapy at discharge.     If not, patient will need ambulance transport, full equipment and [place] support to d/c home.    Follow Up Recommendations CIR;SNF;Supervision/Assistance - 24 hour    Equipment Recommendations  Other (comment) (TBD at later date)    Recommendations for Other Services OT consult;Speech consult     Precautions / Restrictions Precautions Precautions: Fall Restrictions Weight Bearing Restrictions: No      Mobility  Bed Mobility Overal bed mobility: Needs Assistance Bed Mobility: Supine to Sit;Sit to Supine     Supine to sit: Max assist Sit to supine: Max assist   General bed mobility comments: Verbal and tactile cues to sit at EOB.   Assist to bring LE's off of bed, and to raise trunk to upright position.  Patient required mod-max assist to maintain sitting balance.  Attempted to have patient perform Long-arc quads in sitting - required repeated  cueing with patient only able to initiate movement x2 with RLE.  Fatigued quickly.  Returned to supine with max assist.  Total assist to scoot to [facility].  Transfers                 General transfer comment: NT  Ambulation/Gait                Stairs            Wheelchair Mobility    Modified Rankin (Stroke Patients Only)       Balance Overall balance assessment: Needs assistance Sitting-balance support: Single extremity supported;Feet supported Sitting balance-Leahy Scale: Zero Sitting balance - Comments: Required mod-max assist to maintain sitting balance.  Noted jerking movement of trunk causing trunk to move forward. Postural control: Posterior lean                                   Pertinent Vitals/Pain Pain Assessment: Faces Faces Pain Scale: No hurt (No non-verbal signs of pain)    Home Living Family/patient expects to be discharged to:: Private residence Living Arrangements: Spouse/significant other Available Help at Discharge: Family;Available 24 hours/day Type of Home: House Home Access: Stairs to enter Entrance Stairs-Rails: None Entrance Stairs-Number of Steps: 2 Home Layout: One level Home Equipment: None      Prior Function Level of Independence: Independent               Hand Dominance   Dominant Hand: Right    Extremity/Trunk  Assessment   Upper Extremity Assessment: RUE deficits/detail;LUE deficits/detail;Difficult to assess due to impaired cognition RUE Deficits / Details: Strength grossly 3-/5     LUE Deficits / Details: Strength grossly 2+/5 to 3-/5.   Lower Extremity Assessment: RLE deficits/detail;LLE deficits/detail;Difficult to assess due to impaired cognition RLE Deficits / Details: Strength grossly 3-/5 LLE Deficits / Details: Strength grossly 2+/5  Cervical / Trunk Assessment: Other exceptions  Communication   Communication: Expressive difficulties (speaks Spanish and some English; min speech)   Cognition Arousal/Alertness: Lethargic (Eyes open and fixed on objects. Touch pt's face to get atten) Behavior During Therapy: Flat affect Overall Cognitive Status: Impaired/Different from baseline Area of Impairment: Orientation;Attention;Memory;Following commands;Safety/judgement;Awareness;Problem solving Orientation Level: Disoriented to;Place;Time;Situation (Unable to state wife's name) Current Attention Level: Focused Memory: Decreased short-term memory Following Commands: Follows one step commands inconsistently;Follows one step commands with increased time Safety/Judgement: Decreased awareness of deficits;Decreased awareness of safety   Problem Solving: Slow processing;Decreased initiation;Difficulty sequencing;Requires verbal cues;Requires tactile cues General Comments: Per wife, this is very different than pta.  Per RN, patient was able to converse this am.  Will try to see patient early in am for any fluctuations due to time.    General Comments      Exercises        Assessment/Plan    PT Assessment Patient needs continued PT services  PT Diagnosis Difficulty walking;Generalized weakness;Altered mental status   PT Problem List Decreased strength;Decreased activity tolerance;Decreased balance;Decreased mobility;Decreased coordination;Decreased cognition;Decreased knowledge of use of DME;Decreased safety awareness  PT Treatment Interventions DME instruction;Gait training;Functional mobility training;Therapeutic activities;Therapeutic exercise;Balance training;Neuromuscular re-education;Cognitive remediation;Patient/family education   PT Goals (Current goals can be found in the Care Plan section) Acute Rehab PT Goals Patient Stated Goal: Unable to state PT Goal Formulation: With family Time For Goal Achievement: 10/02/15 Potential to Achieve Goals: Fair    Frequency Min 4X/week   Barriers to discharge Decreased caregiver support Do not feel wife can provide current  level of care patient requires.    Co-evaluation               End of Session   Activity Tolerance: Patient limited by fatigue;Patient limited by lethargy (Limited by decreased level of arousal) Patient left: in bed;with call bell/phone within reach;with family/visitor present Nurse Communication: Mobility status (Decreased cognition/interaction. Significant change from bas)         Time: 6759-1638 PT Time Calculation (min) (ACUTE ONLY): 18 min   Charges:   PT Evaluation $PT Eval Moderate Complexity: 1 Procedure     PT G Codes:        Despina Pole 10-03-2015, 7:39 PM Carita Pian. Sanjuana Kava, [place] Pager [phone]

## 2015-09-18 NOTE — Progress Notes (Signed)
**  Preliminary report by tech**  Bilateral lower extremity venous duplex completed. There is no evidence of deep or superficial vein thrombosis involving the right and left lower extremities. All visualized vessels appear patent and compressible. There is no evidence of Baker's cysts bilaterally.  09/18/15 9:34 AM Charles [name] RVT

## 2015-09-18 NOTE — Progress Notes (Signed)
[facility] Progress Note   Subjective: Minimally interactive, staring ahead, wife says he is very weak, that talking makes him tired. Per wife, patient does not feel well. Patient nods affirmatively to this statement.   Objective Vitals:   09/17/15 1533 09/17/15 1820 09/17/15 2129 09/18/15 0629  BP:  100/71 (!) 108/58 114/68  Pulse:  (!) 126 86 89  Resp:  '18 18 18  '$ Temp: 98.6 F (37 C) 97.7 F (36.5 C) 97.5 F (36.4 C) (!) 96.6 F (35.9 C)  TempSrc: Oral Oral Axillary Axillary  SpO2:  98% 97% 99%  Weight:   54 kg (119 lb)   Height:   '5\' 2"'$  (1.575 m)    Physical Exam General: Chronically ill appearing male in NAD Heart: S1, S2, RRR HR 80s Lungs: BBS CTA A/P Resp shallow No WOB Abdomen: active BS Nontender Extremities: No LE edema Dialysis Access: LUA AVF + bruit. RIJ removed-serosangous drainage   Dialysis Orders: [place] T,Th,S 4 hours 400/800 EDW 58 kg  2.0 K/3.5 Ca No Heparin Aranesp 200 mcg q week (last dose 09/10/15 Last in-center HGB 8.2 09/03/15 Fe 37 Tsat 23 Ferritin 2775 08/27/15) Hectoral: 5 mcg IV q tx q T,Th, S Calcium acetate/TUMS extra strength.  Additional Objective Labs: Basic Metabolic Panel:  Recent Labs Lab 09/15/15 0710 09/17/15 0539 09/18/15 0731  NA 132* 137 138  K 3.9 3.9 5.0  CL 97* 99* 99*  CO2 '24 26 26  '$ GLUCOSE 87 110* 83  BUN 22* 27* 32*  CREATININE 5.05* 6.03* 4.76*  CALCIUM 7.8* 8.8* 10.1  PHOS 2.3* 4.7* 4.9*   Liver Function Tests:  Recent Labs Lab 09/15/15 0710 09/17/15 0539 09/18/15 0731  ALBUMIN 1.2* 1.4* 1.4*   CBC:  Recent Labs Lab 09/13/15 0608 09/14/15 0815 09/15/15 0710 09/17/15 0539 09/18/15 0731  WBC 16.2* 16.8* 19.2* 21.1* 17.1*  HGB 9.5* 10.2* 9.6* 11.2* 9.3*  HCT 29.5* 32.4* 30.6* 35.5* 29.5*  MCV 91.9 93.9 94.4 95.7 94.9  PLT 250 228 206 183 171   Blood Culture    Component Value Date/Time   SDES URINE, RANDOM 09/11/2015 1128   SPECREQUEST NONE 09/11/2015 1128   CULT 1,000  COLONIES/mL INSIGNIFICANT GROWTH (A) 09/11/2015 1128   REPTSTATUS 09/13/2015 FINAL 09/11/2015 1128    Cardiac Enzymes: No results for input(s): CKTOTAL, CKMB, CKMBINDEX, TROPONINI in the last 168 hours. CBG:  Recent Labs Lab 09/17/15 0851  GLUCAP 99   Iron Studies: No results for input(s): IRON, TIBC, TRANSFERRIN, FERRITIN in the last 72 hours. '@lablastinr3'$ @ Studies/Results: No results found. Medications:   . sodium chloride   Intravenous Once  . acetaminophen  1,000 mg Oral BID WC  . calcium carbonate  1 tablet Oral TID  . darbepoetin (ARANESP) injection - DIALYSIS  100 mcg Intravenous Q Thu-HD  . doxercalciferol  5 mcg Intravenous Q T,Th,Sa-HD  . feeding supplement  1 Container Oral TID BM  . feeding supplement (PRO-STAT SUGAR FREE 64)  30 mL Oral Q1500  . heparin subcutaneous  5,000 Units Subcutaneous Q8H  . metoprolol tartrate  50 mg Oral BID  . mirtazapine  7.5 mg Oral QHS  . morphine  15 mg Oral Q12H  . multivitamin  1 tablet Oral QHS  . naproxen  250 mg Oral TID WC  . polyethylene glycol  17 g Oral Daily  . senna-docusate  2 tablet Oral BID  . sodium chloride flush  3 mL Intravenous Q12H  . traMADol  50 mg Oral BID  . valACYclovir  500 mg Oral Daily     Assessment/Plan:  1. FUO culture neg - likely tumor related; new lung masses c/w per progression oer ONC; ID following. HD catheter removed 09/17/15. Per ID,  rec.starting naprosyn for probable tumor fever. BLE doppler done this AM-no evidence of DVT HIV and hep panel neg, CMV positive < 200 and EBV <18 . Tmax today 96.6 WBC down to 17.1 2. ESRD - TTS @ [place]. Had HD yesterday on schedule. Next tx tomorrow.  3. Anemia - hgb 9.3 today. Has rec'd units PRBCs  this admit - Aranesp 100 q Thursday. Follow HGB  4. Secondary hyperparathyroidism -had very low Ca after denosumab (given for skeletal protection to myeloma pts);on Tums/ Hect/ high Ca bath. Ca today 10.1. Will recheck tomorrow-drop to 2.5 Ca bath tomorrow.  5.  HTN/volume - HD yesterday, Pre Wt 55 kg Net UF 1100 Post wt 53.7. No edema. SBP low 100s at end of Tx. Run even tomorrow.  6. Severe PCM - alb 1.2- liberalized diet to regular + fluid restriction - RD consult K and P not an issue 7. MM- chemo plans in future per heme-onc. Dr. Jana Hakim following.    Rita H. Brown NP-C 09/18/2015, 9:39 AM  [name] [phone]     I have seen and examined this patient and agree with plan and assessment in the above note with renal recommendations/intervention highlighted.  Difficult situation as he does not appear to be responding to therapy.  Continue with HD on TTS schedule. Broadus John A Davion Meara,MD 09/18/2015 1:06 PM

## 2015-09-18 NOTE — Progress Notes (Signed)
Patient ID: Charles [name], male   DOB: [birth year], 40 y.o.   MRN: [id number]          [facility]    Date of Admission:  09/10/2015     Remains very weak but he has been afebrile since starting on naproxen. This is further evidence that he has been having tumor fevers. I will sign off now but please call if I can be of further assistance.         Michel Bickers, MD [facility] [phone] pager   [phone] cell 02/03/2015, 1:32 PM

## 2015-09-18 NOTE — Progress Notes (Addendum)
PROGRESS NOTE        PATIENT DETAILS Name: Charles [name] Age: 40 y.o. Sex: male Date of Birth: 1975/06/17 Admit Date: 09/10/2015 Admitting Physician Roney Jaffe, MD [id number], Gabrielle Dare, MD  Brief Narrative: [name] with history of multiple myeloma on outpatient chemotherapy, ESRD on hemodialysis (TTS) admitted for evaluation of fever of unknown origin. Blood cultures negative so far. CT of the chest, along with abdomen and pelvis negative for any infection. Thought to have  tumor fever at this time-has no foci of infection currently apparent-has been started on naproxen on 8/17 with no further fever. Unfortunately, he has now become very weak and deconditioned due to his acute illness, and will require physical therapy evaluation prior to discharge.   Subjective: Afebrile for almost 24 hours after starting naproxen. Feels weak, but looks better than yesterday..  Assessment/Plan: Principal Problem: Sepsis due to fever of unknown origin: Sepsis pathophysiology has resolved, no further fever since starting naproxen. Blood cultures negative and no foci of infection evident on CT chest and abdomen. Previously was empirically on vancomycin and Zosyn, no longer on antibiotics-felt to have tumor fever  Active Problems: FUO: Blood cultures negative,  no infective foci seen on CT of the abdomen and chest. HIV and acute hepatitis panel negative. CMV/EBV serology negative. Hemodialysis catheter was removed on 8/17. After extensive workup with no findings, started on naproxen for presumed tumor fever on 8/17-he no longer is febrile. Current working diagnosis is FUO was likely secondary to tumor fever. No further recommendations from infectious disease, continue naproxen.  Mild sinus tachycardia: Suspect secondary to ongoing fever, TSH within normal limits.  No chest pain or shortness of breath to indicate venous thromboembolism. Lower extremity  Dopplers negative for DVT. No further tachycardia as fever has resolved. Monitor periodically.   ESRD: On hemodialysis-TTS-nephrology following. Hemodialysis catheter was removed on 8/17-to see if this would improve his fever-however blood cultures were negative. Catheter tip cultures are pending-please follow  Anemia: Multifactorial-likely secondary to acute illness-with underlying anemia of chronic disease from malignancy and ESRD. Hemoglobin stable after 1 unit of transfusion. Follow.  Leukocytosis: Not sure if this could be explained by tumor fever-but it is not down trending without any antimicrobial agents. Follow.  History of multiple myeloma with pleural-based lung metastases: Followed by oncology in the outpatient setting-chemotherapy currently on hold. On prophylactic Valtrex.  Hypertension: Controlled with metoprolol  Chronic pain syndrome: Appears to be on narcotics chronically-MS Contin has been resumed. On bowel regimen with MiraLAX and Senokot  Malnutrition of moderate degree: Continue supplements  Deconditioning: Secondary to acute illness-now no longer febrile-have ordered PT evaluation today. Suspect will require home with services on discharge.  DVT Prophylaxis: Prophylactic heparin  Code Status: Full code   Family Communication: Spouse at bedside  Disposition Plan: Remain inpatient-requires several more days of hospitalization prior to discharge  Antimicrobial agents: IV vancomycin 8/10>> 8/14 IV Zosyn 8/10>> 8/15  Procedures: None  CONSULTS:  ID and nephrology   Oncology  Time spent: 25 minutes-Greater than 50% of this time was spent in counseling, explanation of diagnosis, planning of further management, and coordination of care.  MEDICATIONS: Anti-infectives    Start     Dose/Rate Route Frequency Ordered Stop   09/14/15 1300  vancomycin (VANCOCIN) IVPB 750 mg/150 ml premix  Status:  Discontinued     750  mg 150 mL/hr over 60 Minutes Intravenous   Once 09/14/15 1250 09/14/15 1506   09/12/15 1400  valACYclovir (VALTREX) tablet 500 mg     500 mg Oral Daily 09/12/15 1104     09/12/15 1200  vancomycin (VANCOCIN) 500 mg in sodium chloride 0.9 % 100 mL IVPB  Status:  Discontinued     500 mg 100 mL/hr over 60 Minutes Intravenous Every T-Th-Sa (Hemodialysis) 09/10/15 1959 09/12/15 0806   09/12/15 1200  vancomycin (VANCOCIN) IVPB 750 mg/150 ml premix  Status:  Discontinued     750 mg 150 mL/hr over 60 Minutes Intravenous Every T-Th-Sa (Hemodialysis) 09/12/15 0806 09/14/15 1506   09/12/15 1017  Vancomycin (VANCOCIN) 750-5 MG/150ML-% IVPB    Comments:  [name]   : cabinet override      09/12/15 1017 09/12/15 1032   09/11/15 0600  piperacillin-tazobactam (ZOSYN) IVPB 3.375 g  Status:  Discontinued     3.375 g 12.5 mL/hr over 240 Minutes Intravenous Every 12 hours 09/10/15 1959 09/15/15 1027   09/11/15 0530  vancomycin (VANCOCIN) 500 mg in sodium chloride 0.9 % 100 mL IVPB  Status:  Discontinued     500 mg 100 mL/hr over 60 Minutes Intravenous Every 12 hours 09/10/15 1656 09/10/15 1950   09/10/15 2330  piperacillin-tazobactam (ZOSYN) IVPB 3.375 g  Status:  Discontinued     3.375 g 12.5 mL/hr over 240 Minutes Intravenous Every 8 hours 09/10/15 1656 09/10/15 1950   09/10/15 1700  piperacillin-tazobactam (ZOSYN) IVPB 3.375 g  Status:  Discontinued     3.375 g 100 mL/hr over 30 Minutes Intravenous  Once 09/10/15 1656 09/10/15 1701   09/10/15 1700  vancomycin (VANCOCIN) IVPB 1000 mg/200 mL premix     1,000 mg 200 mL/hr over 60 Minutes Intravenous  Once 09/10/15 1656 09/10/15 1801   09/10/15 1645  piperacillin-tazobactam (ZOSYN) IVPB 3.375 g     3.375 g 100 mL/hr over 30 Minutes Intravenous  Once 09/10/15 1637 09/10/15 1721   09/10/15 1645  vancomycin (VANCOCIN) IVPB 1000 mg/200 mL premix  Status:  Discontinued     1,000 mg 200 mL/hr over 60 Minutes Intravenous  Once 09/10/15 1637 09/10/15 1701      Scheduled Meds: . sodium chloride    Intravenous Once  . acetaminophen  1,000 mg Oral BID WC  . calcium carbonate  1 tablet Oral TID  . darbepoetin (ARANESP) injection - DIALYSIS  100 mcg Intravenous Q Thu-HD  . doxercalciferol  5 mcg Intravenous Q T,Th,Sa-HD  . feeding supplement  1 Container Oral TID BM  . feeding supplement (PRO-STAT SUGAR FREE 64)  30 mL Oral Q1500  . heparin subcutaneous  5,000 Units Subcutaneous Q8H  . metoprolol tartrate  50 mg Oral BID  . mirtazapine  7.5 mg Oral QHS  . morphine  15 mg Oral Q12H  . multivitamin  1 tablet Oral QHS  . naproxen  250 mg Oral TID WC  . polyethylene glycol  17 g Oral Daily  . senna-docusate  2 tablet Oral BID  . sodium chloride flush  3 mL Intravenous Q12H  . traMADol  50 mg Oral BID  . valACYclovir  500 mg Oral Daily   Continuous Infusions:  PRN Meds:.sodium chloride, acetaminophen **OR** acetaminophen, HYDROmorphone (DILAUDID) injection, LORazepam, morphine injection, ondansetron **OR** ondansetron (ZOFRAN) IV, sodium chloride flush   PHYSICAL EXAM: Vital signs: Vitals:   09/17/15 1820 09/17/15 2129 09/18/15 0629 09/18/15 0720  BP: 100/71 (!) 108/58 114/68 116/76  Pulse: (!) 126 86 89 84  Resp: _0 Temp: 97.7 F (36.5 C) 97.5 F (36.4 C) (!) 96.6 F (35.9 C) 97.4 F (36.3 C)  TempSrc: Oral Axillary Axillary Oral  SpO2: 98% 97% 99% 97%  Weight:  54 kg (119 lb)    Height:  _1  (1.575 m)     Filed Weights   09/16/15 2020 09/17/15 0700 09/17/15 2129  Weight: 56.9 kg (125 lb 8 oz) 55 kg (121 lb 4.1 oz) 54 kg (119 lb)   Body mass index is 21.77 kg/m.   Gen Exam: Awake, alert-Looks chronically ill-but better than the past few days. Very deconditioned and weak. Neck: Supple, No JVD.   Chest: B/L Clear.  CVS: S1 S2 Regular Abdomen: soft, BS +, non tender, non distended.  Extremities: no edema, lower extremities warm to touch. Neurologic: Non Focal.   Skin: No Rash or lesions   Wounds: N/A.   I have personally reviewed following labs and  imaging studies  LABORATORY DATA: CBC:  Recent Labs Lab 09/13/15 0608 09/14/15 0815 09/15/15 0710 09/17/15 0539 09/18/15 0731  WBC 16.2* 16.8* 19.2* 21.1* 17.1*  HGB 9.5* 10.2* 9.6* 11.2* 9.3*  HCT 29.5* 32.4* 30.6* 35.5* 29.5*  MCV 91.9 93.9 94.4 95.7 94.9  PLT 250 228 206 183 379    Basic Metabolic Panel:  Recent Labs Lab 09/12/15 0816 09/13/15 0608 09/14/15 0815 09/15/15 0710 09/17/15 0539 09/18/15 0731  NA 131* 133* 135 132* 137 138  K 3.7 3.9 3.9 3.9 3.9 5.0  CL 101 96* 102 97* 99* 99*  CO2 _2 GLUCOSE 82 82 85 87 110* 83  BUN 24* 15 27* 22* 27* 32*  CREATININE 4.45* 3.44* 5.41* 5.05* 6.03* 4.76*  CALCIUM 8.1* 7.7* 7.7* 7.8* 8.8* 10.1  PHOS 3.6  --  3.2 2.3* 4.7* 4.9*    GFR: Estimated Creatinine Clearance: 15.8 mL/min (by C-G formula based on SCr of 4.76 mg/dL).  Liver Function Tests:  Recent Labs Lab 09/12/15 0816 09/14/15 0815 09/15/15 0710 09/17/15 0539 09/18/15 0731  ALBUMIN 1.2* 1.2* 1.2* 1.4* 1.4*   No results for input(s): LIPASE, AMYLASE in the last 168 hours. No results for input(s): AMMONIA in the last 168 hours.  Coagulation Profile: No results for input(s): INR, PROTIME in the last 168 hours.  Cardiac Enzymes: No results for input(s): CKTOTAL, CKMB, CKMBINDEX, TROPONINI in the last 168 hours.  BNP (last 3 results) No results for input(s): PROBNP in the last 8760 hours.  HbA1C: No results for input(s): HGBA1C in the last 72 hours.  CBG:  Recent Labs Lab 09/17/15 0851  GLUCAP 99    Lipid Profile: No results for input(s): CHOL, HDL, LDLCALC, TRIG, CHOLHDL, LDLDIRECT in the last 72 hours.  Thyroid Function Tests:  Recent Labs  09/16/15 1825  TSH 3.478    Anemia Panel: No results for input(s): VITAMINB12, FOLATE, FERRITIN, TIBC, IRON, RETICCTPCT in the last 72 hours.  Urine analysis:    Component Value Date/Time   COLORURINE BROWN (A) 09/11/2015 2252   APPEARANCEUR TURBID (A) 09/11/2015 2252    LABSPEC 1.016 09/11/2015 2252   LABSPEC 1.030 01/22/2015 1018   PHURINE 7.5 09/11/2015 2252   GLUCOSEU 100 (A) 09/11/2015 2252   GLUCOSEU Negative 01/22/2015 1018   HGBUR LARGE (A) 09/11/2015 2252   BILIRUBINUR MODERATE (A) 09/11/2015 2252   BILIRUBINUR Negative 01/22/2015 1018   KETONESUR 15 (A) 09/11/2015 2252   PROTEINUR >300 (A) 09/11/2015 2252   UROBILINOGEN 0.2 01/22/2015 1018  NITRITE NEGATIVE 09/11/2015 2252   LEUKOCYTESUR MODERATE (A) 09/11/2015 2252   LEUKOCYTESUR Color Interference due to blood in urine 01/22/2015 1018    Sepsis Labs: Lactic Acid, Venous    Component Value Date/Time   LATICACIDVEN 1.4 09/12/2015 1254    MICROBIOLOGY: Recent Results (from the past 240 hour(s))  TECHNOLOGIST REVIEW     Status: None   Collection Time: 09/09/15 12:49 PM  Result Value Ref Range Status   Technologist Review Rare meta  Final  TECHNOLOGIST REVIEW     Status: None   Collection Time: 09/10/15  2:24 PM  Result Value Ref Range Status   Technologist Review   Final    1% nrbc, Variant lymphs, some appear plasmacytoid, present  Blood Culture (routine x 2)     Status: None   Collection Time: 09/10/15  4:40 PM  Result Value Ref Range Status   Specimen Description BLOOD RIGHT ANTECUBITAL  Final   Special Requests BOTTLES DRAWN AEROBIC AND ANAEROBIC 5CC  Final   Culture NO GROWTH 5 DAYS  Final   Report Status 09/15/2015 FINAL  Final  Blood Culture (routine x 2)     Status: None   Collection Time: 09/10/15  4:52 PM  Result Value Ref Range Status   Specimen Description BLOOD RIGHT HAND  Final   Special Requests BOTTLES DRAWN AEROBIC ONLY 10CC  Final   Culture NO GROWTH 5 DAYS  Final   Report Status 09/15/2015 FINAL  Final  MRSA PCR Screening     Status: None   Collection Time: 09/10/15 11:09 PM  Result Value Ref Range Status   MRSA by PCR NEGATIVE NEGATIVE Final    Comment:        The GeneXpert MRSA Assay (FDA approved for NASAL specimens only), is one component of  a comprehensive MRSA colonization surveillance program. It is not intended to diagnose MRSA infection nor to guide or monitor treatment for MRSA infections.   Culture, Urine     Status: Abnormal   Collection Time: 09/11/15 11:28 AM  Result Value Ref Range Status   Specimen Description URINE, RANDOM  Final   Special Requests NONE  Final   Culture 1,000 COLONIES/mL INSIGNIFICANT GROWTH (A)  Final   Report Status 09/13/2015 FINAL  Final  Cath Tip Culture     Status: None (Preliminary result)   Collection Time: 09/17/15  1:55 PM  Result Value Ref Range Status   Specimen Description CATH TIP  Final   Special Requests NONE  Final   Culture NO GROWTH < 24 HOURS  Final   Report Status PENDING  Incomplete    RADIOLOGY STUDIES/RESULTS: Ct Abdomen Pelvis Wo Contrast  Result Date: 09/15/2015 CLINICAL DATA:  Fever of unknown origin, acute onset. Initial encounter. EXAM: CT ABDOMEN AND PELVIS WITHOUT CONTRAST TECHNIQUE: Multidetector CT imaging of the abdomen and pelvis was performed following the standard protocol without IV contrast. COMPARISON:  CT of the chest, abdomen and pelvis performed 06/30/2015 FINDINGS: Small bilateral pleural effusions are noted. Bibasilar opacities likely reflect atelectasis, though pneumonia could have a similar appearance. The liver and spleen are unremarkable in appearance. Trace fluid adjacent to the gallbladder is nonspecific. The gallbladder is within normal limits. The pancreas and adrenal glands are unremarkable. A 1.5 cm left renal cyst is noted. There is no evidence of hydronephrosis. No renal or ureteral stones are seen. No perinephric stranding is appreciated. No free fluid is identified. The small bowel is unremarkable in appearance. The stomach is within normal limits. No  acute vascular abnormalities are seen. The appendix is normal in caliber and contains air, without evidence of appendicitis. The colon is grossly unremarkable in appearance. The bladder is  decompressed and not well assessed. The prostate remains normal in size. No inguinal lymphadenopathy is seen. Presacral stranding is noted. No acute osseous abnormalities are identified. Previously noted changes of multiple myeloma are again seen. IMPRESSION: 1. Small bilateral pleural effusions noted. Bibasilar airspace opacities likely reflect atelectasis, though pneumonia could have a similar appearance. 2. Trace fluid adjacent to the gallbladder is nonspecific. The gallbladder is grossly unremarkable in appearance. 3. Small left renal cyst noted. 4. Presacral stranding noted. 5. Mild changes of multiple myeloma again noted within the visualized osseous structures. Electronically Signed   By: Garald Balding M.D.   On: 09/15/2015 03:12   Dg Chest 2 View  Result Date: 09/10/2015 CLINICAL DATA:  Fever and weakness. EXAM: CHEST  2 VIEW COMPARISON:  Multiple chest x-rays since July 06, 2015 FINDINGS: No pneumothorax. Bilateral pleural effusions and underlying opacities are identified. A rounded density in the lateral left lower lung measures up to 3.2 cm and is masslike in appearance. However, this was not present on multiple recent chest x-rays. No other nodules or masses. No overt edema. A right dialysis catheter is again identified. The cardiomediastinal silhouette is stable. IMPRESSION: 1. Rounded masslike low-attenuation density in the lateral left lung not seen on multiple recent chest x-rays. This may represent some loculated fluid. Recommend short-term follow-up to ensure resolution. 2. Bilateral pleural effusions with underlying atelectasis. 3. No other acute abnormalities. Electronically Signed   By: Dorise Bullion III M.D   On: 09/10/2015 17:28   Ct Chest Wo Contrast  Result Date: 09/12/2015 CLINICAL DATA:  Evaluate for mass within the chest. EXAM: CT CHEST WITHOUT CONTRAST TECHNIQUE: Multidetector CT imaging of the chest was performed following the standard protocol without IV contrast. COMPARISON:   Chest radiograph 09/10/2015; chest CT 06/30/2015 FINDINGS: Cardiovascular: Heart is enlarged. Aorta and main pulmonary artery normal in caliber. Central venous catheter tip terminates at the superior cavoatrial junction. Mediastinum/Nodes: No axillary, mediastinal or hilar lymphadenopathy. Mild heterogeneity of the thyroid, unchanged. Esophagus is unremarkable. Lungs/Pleura: Central airways are patent. Subpleural ground-glass and consolidative opacities within the right and left lower lobes. Moderate layering bilateral pleural effusions, increased from prior. No pneumothorax. Motion artifact limits evaluation. There are multiple pleural-based masses demonstrated within both the left and right hemi thorax. A few of these have increased in size measuring up to 2.4 x 1.2 cm within the anterior left hemi thorax (image 66; series 2) and up to 1.3 x 2.7 cm in the lateral left hemi thorax (image 74; series 2). These are associated with underlying osseous destruction of the adjacent ribs including the anterior left second rib, posterior left third rib, the lateral left fifth rib and posterior seventh and ninth ribs. Upper Abdomen: Unremarkable Musculoskeletal: Re- demonstrated lytic lesions within the sternum. Additionally there multiple osseous destructive lesions involving predominantly the left ribs as described above associated with soft tissue masses, most compatible patient's history of multiple myeloma. There are few patchy lucencies throughout the thoracic spine, most notable within the left T4 transverse process. IMPRESSION: Interval increase in size of multiple predominately left-sided pleural based soft tissue masses with associated underlying osseous destruction, most compatible patient's history of multiple myeloma. Persistent lytic lesion within the sternum/manubrium. Moderate layering bilateral pleural effusions, increased from prior, with underlying pulmonary consolidation most compatible with associated  atelectasis. Electronically Signed   By:  Lovey Newcomer M.D.   On: 09/12/2015 07:56     LOS: 8 days   Oren Binet, MD  [facility] Pager:[phone]  If 7PM-7AM, please contact night-coverage [email or web address] Password [facility] 09/18/2015, 10:43 AM

## 2015-09-19 ENCOUNTER — Inpatient Hospital Stay (HOSPITAL_COMMUNITY): Payer: Self-pay

## 2015-09-19 DIAGNOSIS — A419 Sepsis, unspecified organism: Secondary | ICD-10-CM

## 2015-09-19 LAB — BLOOD GAS, ARTERIAL
Acid-Base Excess: 7.3 mmol/L — ABNORMAL HIGH (ref 0.0–2.0)
BICARBONATE: 31.3 meq/L — AB (ref 20.0–24.0)
Drawn by: 10552
O2 Content: 2 L/min
O2 Saturation: 97.5 %
PCO2 ART: 43.8 mmHg (ref 35.0–45.0)
PH ART: 7.467 — AB (ref 7.350–7.450)
PO2 ART: 92 mmHg (ref 80.0–100.0)
Patient temperature: 98.6
TCO2: 32.6 mmol/L (ref 0–100)

## 2015-09-19 LAB — CBC
HCT: 28.5 % — ABNORMAL LOW (ref 39.0–52.0)
HEMOGLOBIN: 8.7 g/dL — AB (ref 13.0–17.0)
MCH: 29.8 pg (ref 26.0–34.0)
MCHC: 30.5 g/dL (ref 30.0–36.0)
MCV: 97.6 fL (ref 78.0–100.0)
PLATELETS: 198 10*3/uL (ref 150–400)
RBC: 2.92 MIL/uL — AB (ref 4.22–5.81)
RDW: 19 % — ABNORMAL HIGH (ref 11.5–15.5)
WBC: 22.3 10*3/uL — ABNORMAL HIGH (ref 4.0–10.5)

## 2015-09-19 LAB — RENAL FUNCTION PANEL
ALBUMIN: 1.4 g/dL — AB (ref 3.5–5.0)
ANION GAP: 13 (ref 5–15)
BUN: 46 mg/dL — ABNORMAL HIGH (ref 6–20)
CALCIUM: 11.2 mg/dL — AB (ref 8.9–10.3)
CO2: 26 mmol/L (ref 22–32)
CREATININE: 6.4 mg/dL — AB (ref 0.61–1.24)
Chloride: 101 mmol/L (ref 101–111)
GFR calc Af Amer: 11 mL/min — ABNORMAL LOW (ref >60)
GFR calc non Af Amer: 10 mL/min — ABNORMAL LOW (ref >60)
GLUCOSE: 121 mg/dL — AB (ref 65–99)
PHOSPHORUS: 4.8 mg/dL — AB (ref 2.5–4.6)
Potassium: 4.4 mmol/L (ref 3.5–5.1)
SODIUM: 140 mmol/L (ref 135–145)

## 2015-09-19 MED ORDER — PENTAFLUOROPROP-TETRAFLUOROETH EX AERO: 1.0000 "application " | INHALATION_SPRAY | CUTANEOUS | Status: DC | PRN

## 2015-09-19 MED ORDER — METOPROLOL TARTRATE 5 MG/5ML IV SOLN
5.0000 mg | Freq: Once | INTRAVENOUS | Status: AC
  Administered 2015-09-19: 5 mg via INTRAVENOUS
  Filled 2015-09-19: qty 5

## 2015-09-19 MED ORDER — SODIUM CHLORIDE 0.9 % IV SOLN: 100.0000 mL | INTRAVENOUS | Status: DC | PRN

## 2015-09-19 MED ORDER — HEPARIN SODIUM (PORCINE) 1000 UNIT/ML DIALYSIS: 1000.0000 [IU] | INTRAMUSCULAR | Status: DC | PRN

## 2015-09-19 MED ORDER — LIDOCAINE HCL (PF) 1 % IJ SOLN: 5.0000 mL | INTRAMUSCULAR | Status: DC | PRN

## 2015-09-19 MED ORDER — LIDOCAINE-PRILOCAINE 2.5-2.5 % EX CREA: 1.0000 "application " | TOPICAL_CREAM | CUTANEOUS | Status: DC | PRN

## 2015-09-19 MED ORDER — ALTEPLASE 2 MG IJ SOLR: 2.0000 mg | Freq: Once | INTRAMUSCULAR | Status: DC | PRN

## 2015-09-19 MED ORDER — DOXERCALCIFEROL 4 MCG/2ML IV SOLN
INTRAVENOUS | Status: AC
  Filled 2015-09-19: qty 4

## 2015-09-19 NOTE — Progress Notes (Signed)
RN to administer 2200 medications. Patient found to be lethargic and hard to arouse. Patient's wife requested to not administer medications. RN will not administer medications due to aspiration risk. RN will continue to monitor patient.  Ermalinda Memos, RN

## 2015-09-19 NOTE — Progress Notes (Signed)
PROGRESS NOTE        PATIENT DETAILS Name: Charles [name] Age: 40 y.o. Sex: male Date of Birth: 08/02/1975 Admit Date: 09/10/2015 Admitting Physician Roney Jaffe, MD [id number], Gabrielle Dare, MD  Brief Narrative: [name] with history of multiple myeloma on outpatient chemotherapy, ESRD on hemodialysis (TTS) admitted for evaluation of fever of unknown origin. Blood cultures negative so far. CT of the chest, along with abdomen and pelvis negative for any infection. Thought to have  tumor fever at this time-has no foci of infection currently apparent-has been started on naproxen on 8/17 with no further fever. Unfortunately, he has now become very weak and deconditioned due to his acute illness, and will require physical therapy evaluation prior to discharge.   Subjective: Afebrile for almost 24 hours after starting naproxen. Feels weak, but looks better than yesterday..  Assessment/Plan: Principal Problem: Sepsis due to fever of unknown origin:  no further fever since starting naproxen. Blood cultures negative and no foci of infection evident on CT chest and abdomen. Previously was empirically on vancomycin and Zosyn, no longer on antibiotics-felt to have tumor fever  Active Problems: FUO: Blood cultures negative,  no infective foci seen on CT of the abdomen and chest. HIV and acute hepatitis panel negative. CMV/EBV serology negative. Hemodialysis catheter was removed on 8/17. After extensive workup with no findings, started on naproxen for presumed tumor fever on 8/17-he no longer is febrile. Current working diagnosis is FUO was likely secondary to tumor fever. No further recommendations from infectious disease, continue naproxen.  Mild sinus tachycardia: Suspect secondary to ongoing fever, TSH within normal limits.  No chest pain or shortness of breath to indicate venous thromboembolism. Lower extremity Dopplers negative for DVT. No further  tachycardia as fever has resolved. Monitor periodically.   ESRD: On hemodialysis-TTS-nephrology following. Hemodialysis catheter was removed on 8/17-to see if this would improve his fever-however blood cultures were negative. Catheter tip cultures are pending-please follow  Anemia: Multifactorial-likely secondary to acute illness-with underlying anemia of chronic disease from malignancy and ESRD. Hemoglobin stable after 1 unit of transfusion. Follow.  Leukocytosis: Not sure if this could be explained by tumor fever-but it is not down trending without any antimicrobial agents. Follow.  History of multiple myeloma with pleural-based lung metastases: Followed by oncology in the outpatient setting-chemotherapy currently on hold. On prophylactic Valtrex.  Hypertension: Controlled with metoprolol  Chronic pain syndrome: Appears to be on narcotics chronically-MS Contin has been resumed. On bowel regimen with MiraLAX and Senokot  Malnutrition of moderate degree: Continue supplements  Deconditioning: Secondary to acute illness-now no longer febrile-have ordered PT evaluation today. Suspect will require home with services on discharge.  DVT Prophylaxis: Prophylactic heparin  Code Status: Full code   Family Communication: None at bedside  Disposition Plan: Remain inpatient-requires several more days of hospitalization prior to discharge  Antimicrobial agents: IV vancomycin 8/10>> 8/14 IV Zosyn 8/10>> 8/15  Procedures: None  CONSULTS:  ID and nephrology   Oncology  Time spent: 25 minutes-Greater than 50% of this time was spent in counseling, explanation of diagnosis, planning of further management, and coordination of care.  MEDICATIONS: Anti-infectives    Start     Dose/Rate Route Frequency Ordered Stop   09/14/15 1300  vancomycin (VANCOCIN) IVPB 750 mg/150 ml premix  Status:  Discontinued     750 mg 150 mL/hr  over 60 Minutes Intravenous  Once 09/14/15 1250 09/14/15 1506    09/12/15 1400  valACYclovir (VALTREX) tablet 500 mg     500 mg Oral Daily 09/12/15 1104     09/12/15 1200  vancomycin (VANCOCIN) 500 mg in sodium chloride 0.9 % 100 mL IVPB  Status:  Discontinued     500 mg 100 mL/hr over 60 Minutes Intravenous Every T-Th-Sa (Hemodialysis) 09/10/15 1959 09/12/15 0806   09/12/15 1200  vancomycin (VANCOCIN) IVPB 750 mg/150 ml premix  Status:  Discontinued     750 mg 150 mL/hr over 60 Minutes Intravenous Every T-Th-Sa (Hemodialysis) 09/12/15 0806 09/14/15 1506   09/12/15 1017  Vancomycin (VANCOCIN) 750-5 MG/150ML-% IVPB    Comments:  [name]   : cabinet override      09/12/15 1017 09/12/15 1032   09/11/15 0600  piperacillin-tazobactam (ZOSYN) IVPB 3.375 g  Status:  Discontinued     3.375 g 12.5 mL/hr over 240 Minutes Intravenous Every 12 hours 09/10/15 1959 09/15/15 1027   09/11/15 0530  vancomycin (VANCOCIN) 500 mg in sodium chloride 0.9 % 100 mL IVPB  Status:  Discontinued     500 mg 100 mL/hr over 60 Minutes Intravenous Every 12 hours 09/10/15 1656 09/10/15 1950   09/10/15 2330  piperacillin-tazobactam (ZOSYN) IVPB 3.375 g  Status:  Discontinued     3.375 g 12.5 mL/hr over 240 Minutes Intravenous Every 8 hours 09/10/15 1656 09/10/15 1950   09/10/15 1700  piperacillin-tazobactam (ZOSYN) IVPB 3.375 g  Status:  Discontinued     3.375 g 100 mL/hr over 30 Minutes Intravenous  Once 09/10/15 1656 09/10/15 1701   09/10/15 1700  vancomycin (VANCOCIN) IVPB 1000 mg/200 mL premix     1,000 mg 200 mL/hr over 60 Minutes Intravenous  Once 09/10/15 1656 09/10/15 1801   09/10/15 1645  piperacillin-tazobactam (ZOSYN) IVPB 3.375 g     3.375 g 100 mL/hr over 30 Minutes Intravenous  Once 09/10/15 1637 09/10/15 1721   09/10/15 1645  vancomycin (VANCOCIN) IVPB 1000 mg/200 mL premix  Status:  Discontinued     1,000 mg 200 mL/hr over 60 Minutes Intravenous  Once 09/10/15 1637 09/10/15 1701      Scheduled Meds: . sodium chloride   Intravenous Once  . acetaminophen   1,000 mg Oral BID WC  . calcium carbonate  1 tablet Oral TID  . darbepoetin (ARANESP) injection - DIALYSIS  100 mcg Intravenous Q Thu-HD  . doxercalciferol      . doxercalciferol  5 mcg Intravenous Q T,Th,Sa-HD  . feeding supplement  1 Container Oral TID BM  . feeding supplement (PRO-STAT SUGAR FREE 64)  30 mL Oral Q1500  . heparin subcutaneous  5,000 Units Subcutaneous Q8H  . metoprolol tartrate  50 mg Oral BID  . mirtazapine  7.5 mg Oral QHS  . morphine  15 mg Oral Q12H  . multivitamin  1 tablet Oral QHS  . naproxen  250 mg Oral TID WC  . polyethylene glycol  17 g Oral Daily  . senna-docusate  2 tablet Oral BID  . sodium chloride flush  3 mL Intravenous Q12H  . traMADol  50 mg Oral BID  . valACYclovir  500 mg Oral Daily   Continuous Infusions:  PRN Meds:.sodium chloride, acetaminophen **OR** acetaminophen, HYDROmorphone (DILAUDID) injection, LORazepam, morphine injection, ondansetron **OR** ondansetron (ZOFRAN) IV, sodium chloride flush   PHYSICAL EXAM: Vital signs: Vitals:   09/19/15 1030 09/19/15 1100 09/19/15 1109 09/19/15 1253  BP: (!) 141/99 127/81 131/84 116/73  Pulse: Marland Kitchen)  123 (!) 123 (!) 120 (!) 125  Resp: (!) 26 (!) 26 (!) 26 20  Temp:   98.9 F (37.2 C) 99.7 F (37.6 C)  TempSrc:   Oral Oral  SpO2: 100% 100% 96% 98%  Weight:   54.7 kg (120 lb 9.5 oz)   Height:       Filed Weights   09/18/15 2007 09/19/15 0655 09/19/15 1109  Weight: 56.3 kg (124 lb 1.6 oz) 54.6 kg (120 lb 5.9 oz) 54.7 kg (120 lb 9.5 oz)   Body mass index is 22.06 kg/m.   Gen Exam: Lethargic, frail, chronically ill-appearing. Very deconditioned and weak. Neck: Supple, No JVD.   Chest: B/L Clear.  CVS: S1 S2 Regular Abdomen: soft, BS +, non tender, non distended.  Extremities: no edema, lower extremities warm to touch. Neurologic: Non Focal.   Skin: No Rash or lesions   Wounds: N/A.   I have personally reviewed following labs and imaging studies  LABORATORY DATA: CBC:  Recent  Labs Lab 09/14/15 0815 09/15/15 0710 09/17/15 0539 09/18/15 0731 09/19/15 0412  WBC 16.8* 19.2* 21.1* 17.1* 22.3*  HGB 10.2* 9.6* 11.2* 9.3* 8.7*  HCT 32.4* 30.6* 35.5* 29.5* 28.5*  MCV 93.9 94.4 95.7 94.9 97.6  PLT 228 206 183 171 676    Basic Metabolic Panel:  Recent Labs Lab 09/14/15 0815 09/15/15 0710 09/17/15 0539 09/18/15 0731 09/19/15 0412  NA 135 132* 137 138 140  K 3.9 3.9 3.9 5.0 4.4  CL 102 97* 99* 99* 101  CO2 25 24 26 26 26   GLUCOSE 85 87 110* 83 121*  BUN 27* 22* 27* 32* 46*  CREATININE 5.41* 5.05* 6.03* 4.76* 6.40*  CALCIUM 7.7* 7.8* 8.8* 10.1 11.2*  PHOS 3.2 2.3* 4.7* 4.9* 4.8*    GFR: Estimated Creatinine Clearance: 11.8 mL/min (by C-G formula based on SCr of 6.4 mg/dL).  Liver Function Tests:  Recent Labs Lab 09/14/15 0815 09/15/15 0710 09/17/15 0539 09/18/15 0731 09/19/15 0412  ALBUMIN 1.2* 1.2* 1.4* 1.4* 1.4*   No results for input(s): LIPASE, AMYLASE in the last 168 hours. No results for input(s): AMMONIA in the last 168 hours.  Coagulation Profile: No results for input(s): INR, PROTIME in the last 168 hours.  Cardiac Enzymes: No results for input(s): CKTOTAL, CKMB, CKMBINDEX, TROPONINI in the last 168 hours.  BNP (last 3 results) No results for input(s): PROBNP in the last 8760 hours.  HbA1C: No results for input(s): HGBA1C in the last 72 hours.  CBG:  Recent Labs Lab 09/17/15 0851  GLUCAP 99    Lipid Profile: No results for input(s): CHOL, HDL, LDLCALC, TRIG, CHOLHDL, LDLDIRECT in the last 72 hours.  Thyroid Function Tests:  Recent Labs  09/16/15 1825  TSH 3.478    Anemia Panel: No results for input(s): VITAMINB12, FOLATE, FERRITIN, TIBC, IRON, RETICCTPCT in the last 72 hours.  Urine analysis:    Component Value Date/Time   COLORURINE BROWN (A) 09/11/2015 2252   APPEARANCEUR TURBID (A) 09/11/2015 2252   LABSPEC 1.016 09/11/2015 2252   LABSPEC 1.030 01/22/2015 1018   PHURINE 7.5 09/11/2015 2252    GLUCOSEU 100 (A) 09/11/2015 2252   GLUCOSEU Negative 01/22/2015 1018   HGBUR LARGE (A) 09/11/2015 2252   BILIRUBINUR MODERATE (A) 09/11/2015 2252   BILIRUBINUR Negative 01/22/2015 1018   KETONESUR 15 (A) 09/11/2015 2252   PROTEINUR >300 (A) 09/11/2015 2252   UROBILINOGEN 0.2 01/22/2015 1018   NITRITE NEGATIVE 09/11/2015 2252   LEUKOCYTESUR MODERATE (A) 09/11/2015 2252   LEUKOCYTESUR  Color Interference due to blood in urine 01/22/2015 1018    Sepsis Labs: Lactic Acid, Venous    Component Value Date/Time   LATICACIDVEN 1.4 09/12/2015 1254    MICROBIOLOGY: Recent Results (from the past 240 hour(s))  TECHNOLOGIST REVIEW     Status: None   Collection Time: 09/10/15  2:24 PM  Result Value Ref Range Status   Technologist Review   Final    1% nrbc, Variant lymphs, some appear plasmacytoid, present  Blood Culture (routine x 2)     Status: None   Collection Time: 09/10/15  4:40 PM  Result Value Ref Range Status   Specimen Description BLOOD RIGHT ANTECUBITAL  Final   Special Requests BOTTLES DRAWN AEROBIC AND ANAEROBIC 5CC  Final   Culture NO GROWTH 5 DAYS  Final   Report Status 09/15/2015 FINAL  Final  Blood Culture (routine x 2)     Status: None   Collection Time: 09/10/15  4:52 PM  Result Value Ref Range Status   Specimen Description BLOOD RIGHT HAND  Final   Special Requests BOTTLES DRAWN AEROBIC ONLY 10CC  Final   Culture NO GROWTH 5 DAYS  Final   Report Status 09/15/2015 FINAL  Final  MRSA PCR Screening     Status: None   Collection Time: 09/10/15 11:09 PM  Result Value Ref Range Status   MRSA by PCR NEGATIVE NEGATIVE Final    Comment:        The GeneXpert MRSA Assay (FDA approved for NASAL specimens only), is one component of a comprehensive MRSA colonization surveillance program. It is not intended to diagnose MRSA infection nor to guide or monitor treatment for MRSA infections.   Culture, Urine     Status: Abnormal   Collection Time: 09/11/15 11:28 AM  Result  Value Ref Range Status   Specimen Description URINE, RANDOM  Final   Special Requests NONE  Final   Culture 1,000 COLONIES/mL INSIGNIFICANT GROWTH (A)  Final   Report Status 09/13/2015 FINAL  Final  Cath Tip Culture     Status: None (Preliminary result)   Collection Time: 09/17/15  1:55 PM  Result Value Ref Range Status   Specimen Description CATH TIP  Final   Special Requests NONE  Final   Culture NO GROWTH < 24 HOURS  Final   Report Status PENDING  Incomplete    RADIOLOGY STUDIES/RESULTS: Ct Abdomen Pelvis Wo Contrast  Result Date: 09/15/2015 CLINICAL DATA:  Fever of unknown origin, acute onset. Initial encounter. EXAM: CT ABDOMEN AND PELVIS WITHOUT CONTRAST TECHNIQUE: Multidetector CT imaging of the abdomen and pelvis was performed following the standard protocol without IV contrast. COMPARISON:  CT of the chest, abdomen and pelvis performed 06/30/2015 FINDINGS: Small bilateral pleural effusions are noted. Bibasilar opacities likely reflect atelectasis, though pneumonia could have a similar appearance. The liver and spleen are unremarkable in appearance. Trace fluid adjacent to the gallbladder is nonspecific. The gallbladder is within normal limits. The pancreas and adrenal glands are unremarkable. A 1.5 cm left renal cyst is noted. There is no evidence of hydronephrosis. No renal or ureteral stones are seen. No perinephric stranding is appreciated. No free fluid is identified. The small bowel is unremarkable in appearance. The stomach is within normal limits. No acute vascular abnormalities are seen. The appendix is normal in caliber and contains air, without evidence of appendicitis. The colon is grossly unremarkable in appearance. The bladder is decompressed and not well assessed. The prostate remains normal in size. No inguinal lymphadenopathy is seen.  Presacral stranding is noted. No acute osseous abnormalities are identified. Previously noted changes of multiple myeloma are again seen.  IMPRESSION: 1. Small bilateral pleural effusions noted. Bibasilar airspace opacities likely reflect atelectasis, though pneumonia could have a similar appearance. 2. Trace fluid adjacent to the gallbladder is nonspecific. The gallbladder is grossly unremarkable in appearance. 3. Small left renal cyst noted. 4. Presacral stranding noted. 5. Mild changes of multiple myeloma again noted within the visualized osseous structures. Electronically Signed   By: Garald Balding M.D.   On: 09/15/2015 03:12   Dg Chest 2 View  Result Date: 09/10/2015 CLINICAL DATA:  Fever and weakness. EXAM: CHEST  2 VIEW COMPARISON:  Multiple chest x-rays since July 06, 2015 FINDINGS: No pneumothorax. Bilateral pleural effusions and underlying opacities are identified. A rounded density in the lateral left lower lung measures up to 3.2 cm and is masslike in appearance. However, this was not present on multiple recent chest x-rays. No other nodules or masses. No overt edema. A right dialysis catheter is again identified. The cardiomediastinal silhouette is stable. IMPRESSION: 1. Rounded masslike low-attenuation density in the lateral left lung not seen on multiple recent chest x-rays. This may represent some loculated fluid. Recommend short-term follow-up to ensure resolution. 2. Bilateral pleural effusions with underlying atelectasis. 3. No other acute abnormalities. Electronically Signed   By: Dorise Bullion III M.D   On: 09/10/2015 17:28   Ct Chest Wo Contrast  Result Date: 09/12/2015 CLINICAL DATA:  Evaluate for mass within the chest. EXAM: CT CHEST WITHOUT CONTRAST TECHNIQUE: Multidetector CT imaging of the chest was performed following the standard protocol without IV contrast. COMPARISON:  Chest radiograph 09/10/2015; chest CT 06/30/2015 FINDINGS: Cardiovascular: Heart is enlarged. Aorta and main pulmonary artery normal in caliber. Central venous catheter tip terminates at the superior cavoatrial junction. Mediastinum/Nodes: No  axillary, mediastinal or hilar lymphadenopathy. Mild heterogeneity of the thyroid, unchanged. Esophagus is unremarkable. Lungs/Pleura: Central airways are patent. Subpleural ground-glass and consolidative opacities within the right and left lower lobes. Moderate layering bilateral pleural effusions, increased from prior. No pneumothorax. Motion artifact limits evaluation. There are multiple pleural-based masses demonstrated within both the left and right hemi thorax. A few of these have increased in size measuring up to 2.4 x 1.2 cm within the anterior left hemi thorax (image 66; series 2) and up to 1.3 x 2.7 cm in the lateral left hemi thorax (image 74; series 2). These are associated with underlying osseous destruction of the adjacent ribs including the anterior left second rib, posterior left third rib, the lateral left fifth rib and posterior seventh and ninth ribs. Upper Abdomen: Unremarkable Musculoskeletal: Re- demonstrated lytic lesions within the sternum. Additionally there multiple osseous destructive lesions involving predominantly the left ribs as described above associated with soft tissue masses, most compatible patient's history of multiple myeloma. There are few patchy lucencies throughout the thoracic spine, most notable within the left T4 transverse process. IMPRESSION: Interval increase in size of multiple predominately left-sided pleural based soft tissue masses with associated underlying osseous destruction, most compatible patient's history of multiple myeloma. Persistent lytic lesion within the sternum/manubrium. Moderate layering bilateral pleural effusions, increased from prior, with underlying pulmonary consolidation most compatible with associated atelectasis. Electronically Signed   By: Lovey Newcomer M.D.   On: 09/12/2015 07:56   Ir Removal Tun Cv Cath W/o Fl  Result Date: 09/18/2015 INDICATION: End-stage renal disease. Fever. Patient also has functional permanent hemodialysis access.  Request removal of tunneled hemodialysis catheter EXAM: REMOVAL TUNNELED CENTRAL  HEMODIALYSIS CATHETER MEDICATIONS: None; ANESTHESIA/SEDATION: Moderate Sedation Time: None The patient's level of consciousness and vital signs were monitored continuously by radiology nursing throughout the procedure under my direct supervision. FLUOROSCOPY TIME:  Fluoroscopy Time: None COMPLICATIONS: None immediate. PROCEDURE: Informed written consent was obtained from the patient after a thorough discussion of the procedural risks, benefits and alternatives. All questions were addressed. Maximal Sterile Barrier Technique was utilized including caps, mask, sterile gowns, sterile gloves, sterile drape, hand hygiene and skin antiseptic. A timeout was performed prior to the initiation of the procedure. The patient's right chest and catheter was prepped and draped in a normal sterile fashion. Heparin was removed from both ports of catheter. 1% lidocaine was used for local anesthesia. Using gentle blunt dissection the cuff of the catheter was exposed and the catheter was removed in it's entirety. Pressure was held till hemostasis was obtained. A sterile dressing was applied. The patient tolerated the procedure well with no immediate complications. IMPRESSION: Successful catheter removal as described above. Read by: Ascencion Dike PA-C Electronically Signed   By: Corrie Mckusick D.O.   On: 09/18/2015 14:41     LOS: 9 days   Otha Rickles, MD  [facility] HENID:[phone]  If 7PM-7AM, please contact night-coverage [email or web address] Password [email or web address] 09/19/2015, 1:22 PM

## 2015-09-19 NOTE — Procedures (Signed)
I was present at this dialysis session. I have reviewed the session itself and made appropriate changes. [name] is not doing well with tachycardia, fever, and AMS.  He is no longer comunicative and appears acutely ill.  Will discuss with his primary team, however he may require CT scan of head and LP vs. Palliative care.  Filed Weights   09/17/15 2129 09/18/15 2007 09/19/15 0655  Weight: 54 kg (119 lb) 56.3 kg (124 lb 1.6 oz) 54.6 kg (120 lb 5.9 oz)     Recent Labs Lab 09/19/15 0412  NA 140  K 4.4  CL 101  CO2 26  GLUCOSE 121*  BUN 46*  CREATININE 6.40*  CALCIUM 11.2*  PHOS 4.8*     Recent Labs Lab 09/17/15 0539 09/18/15 0731 09/19/15 0412  WBC 21.1* 17.1* 22.3*  HGB 11.2* 9.3* 8.7*  HCT 35.5* 29.5* 28.5*  MCV 95.7 94.9 97.6  PLT 183 171 198    Scheduled Meds: . sodium chloride   Intravenous Once  . acetaminophen  1,000 mg Oral BID WC  . calcium carbonate  1 tablet Oral TID  . darbepoetin (ARANESP) injection - DIALYSIS  100 mcg Intravenous Q Thu-HD  . doxercalciferol  5 mcg Intravenous Q T,Th,Sa-HD  . feeding supplement  1 Container Oral TID BM  . feeding supplement (PRO-STAT SUGAR FREE 64)  30 mL Oral Q1500  . heparin subcutaneous  5,000 Units Subcutaneous Q8H  . metoprolol tartrate  50 mg Oral BID  . mirtazapine  7.5 mg Oral QHS  . morphine  15 mg Oral Q12H  . multivitamin  1 tablet Oral QHS  . naproxen  250 mg Oral TID WC  . polyethylene glycol  17 g Oral Daily  . senna-docusate  2 tablet Oral BID  . sodium chloride flush  3 mL Intravenous Q12H  . traMADol  50 mg Oral BID  . valACYclovir  500 mg Oral Daily   Continuous Infusions:  PRN Meds:.sodium chloride, sodium chloride, sodium chloride, acetaminophen **OR** acetaminophen, alteplase, heparin, HYDROmorphone (DILAUDID) injection, lidocaine (PF), lidocaine-prilocaine, LORazepam, morphine injection, ondansetron **OR** ondansetron (ZOFRAN) IV, pentafluoroprop-tetrafluoroeth, sodium chloride flush    Donetta Potts,  MD 09/19/2015, 8:36 AM

## 2015-09-19 NOTE — Progress Notes (Signed)
Patient is lethargic and  Sleeping just came from dialysis family will call when he is awake to see if he will take his medications since he has been refusing

## 2015-09-19 NOTE — Progress Notes (Signed)
Pt with fever 102,tachycardia heart rate sustained 140-145 Fransico Meadow PA-C notified see physicians orders will continue to monitor

## 2015-09-19 NOTE — Progress Notes (Signed)
PT Cancellation Note  Patient Details Name: Charles [name] MRN: [id number] DOB: 1975/12/21   Cancelled Treatment:    Reason Eval/Treat Not Completed: Medical issues which prohibited therapy Patient with fever and tachycardia.  Continues to have AMS.  Will hold PT today.  Will return at later date for PT session if appropriate for patient.  Despina Pole 09/19/2015, 2:55 PM Carita Pian. Sanjuana Kava, [place] Pager [phone]

## 2015-09-19 NOTE — Progress Notes (Signed)
Inpatient Rehabilitation  Last evening PT requested an IP rehab prescreen.  Pt. not  doing well medically today.  Will follow along at a distance for potential appropriateness for IP Rehab in the coming days, however not recommending IP Rehab consult at this time.     [facility] Admissions Coordinator Cell [phone] Office [phone]

## 2015-09-20 DIAGNOSIS — Z515 Encounter for palliative care: Secondary | ICD-10-CM

## 2015-09-20 LAB — CBC
HCT: 23.6 % — ABNORMAL LOW (ref 39.0–52.0)
HEMOGLOBIN: 7.1 g/dL — AB (ref 13.0–17.0)
MCH: 29.7 pg (ref 26.0–34.0)
MCHC: 30.1 g/dL (ref 30.0–36.0)
MCV: 98.7 fL (ref 78.0–100.0)
Platelets: 177 10*3/uL (ref 150–400)
RBC: 2.39 MIL/uL — AB (ref 4.22–5.81)
RDW: 18.8 % — ABNORMAL HIGH (ref 11.5–15.5)
WBC: 19.3 10*3/uL — ABNORMAL HIGH (ref 4.0–10.5)

## 2015-09-20 LAB — BASIC METABOLIC PANEL
Anion gap: 13 (ref 5–15)
BUN: 23 mg/dL — AB (ref 6–20)
CHLORIDE: 97 mmol/L — AB (ref 101–111)
CO2: 29 mmol/L (ref 22–32)
Calcium: 10.2 mg/dL (ref 8.9–10.3)
Creatinine, Ser: 4.18 mg/dL — ABNORMAL HIGH (ref 0.61–1.24)
GFR calc Af Amer: 19 mL/min — ABNORMAL LOW (ref >60)
GFR calc non Af Amer: 16 mL/min — ABNORMAL LOW (ref >60)
GLUCOSE: 92 mg/dL (ref 65–99)
POTASSIUM: 3.5 mmol/L (ref 3.5–5.1)
Sodium: 139 mmol/L (ref 135–145)

## 2015-09-20 LAB — AMMONIA: AMMONIA: 33 umol/L (ref 9–35)

## 2015-09-20 MED ORDER — LORAZEPAM 2 MG/ML IJ SOLN: 0.5000 mg | INTRAMUSCULAR | Status: DC | PRN

## 2015-09-20 MED ORDER — HALOPERIDOL LACTATE 5 MG/ML IJ SOLN: 0.5000 mg | Freq: Four times a day (QID) | INTRAMUSCULAR | Status: DC | PRN

## 2015-09-20 MED ORDER — GLYCOPYRROLATE 0.2 MG/ML IJ SOLN
0.4000 mg | INTRAMUSCULAR | Status: DC | PRN
  Filled 2015-09-20: qty 2

## 2015-09-20 MED ORDER — HYDROMORPHONE HCL 1 MG/ML IJ SOLN: 0.5000 mg | INTRAMUSCULAR | Status: DC | PRN

## 2015-09-20 NOTE — Progress Notes (Signed)
Patient not arousable at this time. Patient unable to take PO medications. Will monitor.

## 2015-09-20 NOTE — Consult Note (Signed)
Consultation Note Date: 09/20/2015   Patient Name: Charles [name]  DOB: 1975-08-12  MRN: [id number]  Age / Sex: 40 y.o., male  PCP: Charles Garter, Charles [name] Referring Physician: Albertine Patricia, Charles [name]  Reason for Consultation: Establishing goals of care, Hospice Evaluation, Non pain symptom management, Pain control, Psychosocial/spiritual support, Terminal Care and Withdrawal of life-sustaining treatment  HPI/Patient Profile: 40 y.o. male  with past medical history of Multiple myeloma with lung metastases, end-stage renal disease now on hemodialysis as a consequence of multiple myeloma hypertension, admitted on 09/10/2015 with with fever of unknown origin. Blood cultures have been negative so far. Patient has been seen by infectious disease as well as nephrology. Patient has been minimal able to take anything by mouth now for almost 48 hours; no longer speaking. Respirations shallow and irregular. He appears to be actively dying.   Clinical Assessment and Goals of Care: I met with patient'[name] wife and his oldest daughter for goals of care discussion. There tearful but share with me that patient has told them that he knows he is dying, that he is ready and not frightened. He is having nearing death awareness, his deceased father-in-law has been coming to him in speaking to him. He told his wife the other day that he only has a couple more days to live and for her not to be worried.  Spouse of 20 years. They have 5 children together    SUMMARY OF RECOMMENDATIONS   Per patient'[name] discussions with his wife, he is now a DO NOT RESUSCITATE DO NOT INTUBATE; no pressors Stopping dialysis We'll discontinue all oral medications as patient no longer can swallow We'll transfer to 6 N. for comfort care. Patient has a large supportive family who is to be with him from now until he passes Code Status/Advance Care  Planning:  [name]    Symptom Management:   Pain: Patient has been very stoic throughout his illness regarding pain. Per his wife he has not been taking MS Contin on a regular basis. We'll continue with Dilaudid 0.5 mg to 1 mg on a every 2 as needed interval in respect for his wishes. Educated spouse on nonverbal signs and symptoms of pain as well as usage of opioids for shortness of breath. Monitor for need for scheduled dosing  Dyspnea: Continue with oxygen via nasal cannula. Do not advance to facemask, or BiPAP. Other management tools utilized Dilaudid   Fever: Continue to treat high fevers with Tylenol suppository as patient can no longer take Naprosyn  Constipation: We'll defer bowel regimen at this point as he appears to be actively dying  Palliative Prophylaxis:   Aspiration, Delirium Protocol, Frequent Pain Assessment, Oral Care and Turn Reposition  Additional Recommendations (Limitations, Scope, Preferences):  Full Comfort Care  Psycho-social/Spiritual:   Desire for further Chaplaincy support:no  Additional Recommendations: Grief/Bereavement Support  Prognosis:   Hours - Days  Discharge Planning: Anticipated Hospital Death      Primary Diagnoses: Present on Admission: . Sepsis (Charles [name]) . Fever . Hypoalbuminemia due to  protein-calorie malnutrition (Charles [name]) . Anemia . Multiple myeloma not having achieved remission (Charles [name]) . Essential hypertension   I have reviewed the medical record, interviewed the patient and family, and examined the patient. The following aspects are pertinent.  Past Medical History:  Diagnosis Date  . ESRD (end stage renal disease) on dialysis (Charles [name])    Charles [name] (08/06/2015)  . GERD (gastroesophageal reflux disease)   . Headache    "monthly" (08/06/2015)  . History of blood transfusion 07/2015   "related to his kidneys"  . Hypertension   . IgG myeloma (Charles [name])   . Kidney stones   . Lytic bone lesions on xray   . Neuromuscular disorder  (Charles [name])    states both arms are shaking a lot   Social History   Social History  . Marital status: Married    Spouse name: N/A  . Number of children: 5  . Years of education: N/A   Occupational History  . [employer]    Social History Main Topics  . Smoking status: Never Smoker  . Smokeless tobacco: Never Used  . Alcohol use No  . Drug use: No  . Sexual activity: Not Asked   Other Topics Concern  . None   Social History Narrative  . None   Family History  Problem Relation Age of Onset  . Diabetes Father    Scheduled Meds: . sodium chloride   Intravenous Once  . sodium chloride flush  3 mL Intravenous Q12H   Continuous Infusions:  PRN Meds:.sodium chloride, acetaminophen **OR** acetaminophen, glycopyrrolate, haloperidol lactate, HYDROmorphone (DILAUDID) injection, LORazepam, [DISCONTINUED] ondansetron **OR** ondansetron (ZOFRAN) IV, sodium chloride flush Medications Prior to Admission:  Prior to Admission medications   Medication Sig Start Date End Date Taking? Authorizing Provider  acyclovir (ZOVIRAX) 200 MG capsule Take 2 capsules (400 mg total) by mouth daily. 08/19/15  Yes Charles Shape, Charles [name]  Amino Acids-Protein Hydrolys (FEEDING SUPPLEMENT, PRO-STAT SUGAR FREE 64,) LIQD Take 30 mLs by mouth 2 (two) times daily. 07/10/15  Yes Charles Jansky, Charles [name]  calcium acetate (PHOSLO) 667 MG capsule Take 2 capsules (1,334 mg total) by mouth 3 (three) times daily with meals. 07/10/15  Yes Charles Jansky, Charles [name]  dexamethasone (DECADRON) 4 MG tablet Take 5 tablets (27m) on Saturday and Sunday every week (tome 5Bandony eHubbard. 07/11/15  Yes Charles [name]  ferrous sulfate 325 (65 FE) MG tablet Take 1 tablet (325 mg total) by mouth 2 (two) times daily with a meal. 08/10/15  Yes Charles Poisson Charles [name]  hydrALAZINE (APRESOLINE) 25 MG tablet Take 1 tablet (25 mg total) by mouth 3 (three) times daily. 09/07/15  Yes Charles Overly Charles [name]  LORazepam (ATIVAN) 0.5 MG tablet  Take 1 tablet (0.5 mg total) by mouth at bedtime as needed for anxiety. 08/26/15  Yes Charles [name]  metoprolol (LOPRESSOR) 50 MG tablet Take 1 tablet (50 mg total) by mouth 2 (two) times daily. 09/07/15  Yes Charles Overly Charles [name]  mirtazapine (REMERON) 15 MG tablet Take 1 tablet (15 mg total) by mouth at bedtime. 07/10/15  Yes Charles [name]  morphine (MS CONTIN) 15 MG 12 hr tablet Take 1 tablet (15 mg total) by mouth every 12 (twelve) hours. 08/26/15  Yes Charles [name]  Nutritional Supplements (FEEDING SUPPLEMENT, NEPRO CARB STEADY,) LIQD Take 237 mLs by mouth 2 (two) times daily between meals. 07/10/15  Yes Charles [name]  prochlorperazine (COMPAZINE) 10 MG tablet Take 1 tablet (10 mg total) by mouth every 6 (six) hours as needed for nausea or vomiting. 06/11/15  Yes [name], Charles [name]  senna-docusate (SENOKOT-[name]) 8.6-50 MG tablet Take 2 tablets by mouth 2 (two) times daily. 07/10/15  Yes Charles Jansky, Charles [name]  dexamethasone (DECADRON) 4 MG tablet Take 1 tablet (4 mg total) by mouth daily. Take for 2 days starting the night of chemotherapy. 09/12/15   [name], Charles [name]  multivitamin (RENA-VIT) TABS tablet Take 1 tablet by mouth at bedtime. Patient not taking: Reported on 09/10/2015 07/10/15   Charles Jansky, Charles [name]  prochlorperazine (COMPAZINE) 10 MG tablet Take 1 tablet (10 mg total) by mouth every 6 (six) hours as needed (Nausea or vomiting). 09/12/15   [name], Charles [name]  traMADol (ULTRAM) 50 MG tablet Take 1 tablet (50 mg total) by mouth every 6 (six) hours as needed. Patient not taking: Reported on 08/27/2015 06/11/15   [name], Charles [name]   Allergies  Allergen Reactions  . Ciprofloxacin Rash  . Mobic [Meloxicam] Rash   Review of Systems  Unable to perform ROS: Patient nonverbal    Physical Exam  Constitutional:  Young, acutely ill appearing man; minimally responsive  HENT:  Head: Normocephalic and atraumatic.  Pulmonary/Chest:  Shallow, irrg  Neurological:   Minimally responsive; opens his eyes briefly to voice. Not taking anything by mouth or speaking  Skin: Skin is warm.  febrile  Psychiatric:  Calm; no agitation  Nursing note and vitals reviewed.   Vital Signs: BP 133/80 (BP Location: Right Arm)   Pulse (!) 123   Temp (!) 100.6 F (38.1 C) (Oral) Comment: nurse notified  Resp 16   Ht 5' 2"  (1.575 Charles)   Wt 54.7 kg (120 lb 9.5 oz) Comment: bedscale  SpO2 95%   BMI 22.06 kg/Charles  Pain Assessment: Faces POSS *See Group Information*: 1-Acceptable,Awake and alert Pain Score: 0-No pain   SpO2: SpO2: 95 % O2 Device:SpO2: 95 % O2 Flow Rate: .O2 Flow Rate (L/min): 2 L/min  IO: Intake/output summary:  Intake/Output Summary (Last 24 hours) at 09/20/15 1309 Last data filed at 09/20/15 1030  Gross per 24 hour  Intake               60 ml  Output                0 ml  Net               60 ml    LBM: Last BM Date: 09/19/15 Baseline Weight: Weight: 58.4 kg (128 lb 11.2 oz) Most recent weight: Weight: 54.7 kg (120 lb 9.5 oz) (bedscale)     Palliative Assessment/Data:   Flowsheet Rows   Flowsheet Row Most Recent Value  Intake Tab  Referral Department  Hospitalist  Unit at Time of Referral  Med/Surg Unit  Palliative Care Primary Diagnosis  Cancer  Date Notified  09/20/15  Palliative Care Type  New Palliative care  Reason for referral  Clarify Goals of Care, Non-pain Symptom, Pain, End of Life Care Assistance, Psychosocial or Spiritual support  Date of Admission  09/20/15  Date first seen by Palliative Care  09/20/15  # of days Palliative referral response time  0 Day([name])  # of days IP prior to Palliative referral  0  Clinical Assessment  Palliative Performance Scale Score  20%  Pain Max last 24 hours  Not able to report  Pain Min Last 24 hours  Not able to report  Dyspnea Max Last 24 Hours  Not able to report  Dyspnea Min Last 24 hours  Not able to report  Nausea Max Last 24 Hours  Not able to report  Nausea Min Last 24 Hours  Not  able to report  Anxiety Max Last 24 Hours  Not able to report  Anxiety Min Last 24 Hours  Not able to report  Other Max Last 24 Hours  Not able to report  Psychosocial & Spiritual Assessment  Palliative Care Outcomes  Patient/Family meeting held?  Yes  Who was at the meeting?  wife. daughter  Palliative Care Outcomes  Improved pain interventions, Improved non-pain symptom therapy, Clarified goals of care, Changed CPR status, Provided end of life care assistance, Changed to focus on comfort, Provided psychosocial or spiritual support, Counseled regarding hospice  Patient/Family wishes: Interventions discontinued/not started   Mechanical Ventilation, BiPAP, Hemodialysis, Transfusion, Vasopressors, Trach, NIPPV, Tube feedings/TPN, Transfer out of ICU, PEG  Palliative Care follow-up planned  Yes, Facility      Time In: 1200 Time Out: 1320 Time Total: 80 min Greater than 50%  of this time was spent counseling and coordinating care related to the above assessment and plan. Staffed with Dr. Landis Gandy  Signed by: Dory Horn, Charles [name]   Please contact Palliative Medicine Team phone at [phone] for questions and concerns.  For individual provider: See Shea Evans

## 2015-09-20 NOTE — Progress Notes (Signed)
[facility] Progress Note   Subjective:  Not responding to verbal Minimally responsive to painful stimuli. Wife at bedside. Said she he has not eaten since Friday.    Objective Vitals:   09/19/15 1809 09/19/15 2015 09/20/15 0619 09/20/15 0832  BP: 116/68 134/82 130/81 133/80  Pulse: (!) 121 (!) 119 (!) 116 (!) 123  Resp: _0 Temp: 99.5 F (37.5 C) 97 F (36.1 C) 98.9 F (37.2 C) (!) 100.6 F (38.1 C)  TempSrc: Oral  Oral Oral  SpO2: 95% 99% 96% 95%  Weight:      Height:       Physical Exam General: Chronically ill appearing male in NAD Heart: S1, S2, RRR HR 80s Lungs: BBS CTA A/P Resp shallow No WOB Abdomen: active BS Nontender Extremities: No LE edema Dialysis Access: LUA AVF + bruit. RIJ removed-drsg CDI.    Dialysis Orders: [place] T,Th,S 4 hours 400/800 EDW 58 kg 2.0 K/3.5 Ca No Heparin Aranesp 200 mcg q week (last dose 09/10/15 Last in-center HGB 8.2 09/03/15 Fe 37 Tsat 23 Ferritin 2775 08/27/15) Hectoral: 5 mcg IV q tx q T,Th, S Calcium acetate/TUMS extra strength.  Additional Objective Labs: Basic Metabolic Panel:  Recent Labs Lab 09/17/15 0539 09/18/15 0731 09/19/15 0412 09/20/15 0539  NA 137 138 140 139  K 3.9 5.0 4.4 3.5  CL 99* 99* 101 97*  CO2 _1 GLUCOSE 110* 83 121* 92  BUN 27* 32* 46* 23*  CREATININE 6.03* 4.76* 6.40* 4.18*  CALCIUM 8.8* 10.1 11.2* 10.2  PHOS 4.7* 4.9* 4.8*  --    Liver Function Tests:  Recent Labs Lab 09/17/15 0539 09/18/15 0731 09/19/15 0412  ALBUMIN 1.4* 1.4* 1.4*   No results for input(s): LIPASE, AMYLASE in the last 168 hours. CBC:  Recent Labs Lab 09/15/15 0710 09/17/15 0539 09/18/15 0731 09/19/15 0412 09/20/15 0539  WBC 19.2* 21.1* 17.1* 22.3* 19.3*  HGB 9.6* 11.2* 9.3* 8.7* 7.1*  HCT 30.6* 35.5* 29.5* 28.5* 23.6*  MCV 94.4 95.7 94.9 97.6 98.7  PLT 206 183 171 198 177   Blood Culture    Component Value Date/Time   SDES CATH TIP 09/17/2015 1355   SPECREQUEST  NONE 09/17/2015 1355   CULT NO GROWTH 2 DAYS 09/17/2015 1355   REPTSTATUS PENDING 09/17/2015 1355    Cardiac Enzymes: No results for input(s): CKTOTAL, CKMB, CKMBINDEX, TROPONINI in the last 168 hours. CBG:  Recent Labs Lab 09/17/15 0851  GLUCAP 99   Iron Studies: No results for input(s): IRON, TIBC, TRANSFERRIN, FERRITIN in the last 72 hours. _2 @ Studies/Results: Ct Head Wo Contrast  Result Date: 09/19/2015 CLINICAL DATA:  Altered mental status EXAM: CT HEAD WITHOUT CONTRAST TECHNIQUE: Contiguous axial images were obtained from the base of the skull through the vertex without intravenous contrast. COMPARISON:  07/06/2015 FINDINGS: Brain: No intracranial hemorrhage, mass effect or midline shift. No acute cortical infarction. Ventricular size is stable from prior exam. No intraparenchymal brain mass is noted. Vascular: No hyperdense vessel or unexpected calcification. Skull: Again noted lytic skull lesion with soft tissue component in left parietal region consistent with multiple myeloma. There is no significant change from prior exam. Additional smaller lesions are noted in left and right parietal region. Sinuses/Orbits: Paranasal sinuses and mastoid air cells are unremarkable. Other: None IMPRESSION: No acute intracranial abnormality. No acute cortical infarction. Again noted lytic calvarial lesions consistent with multiple myeloma. Electronically Signed   By: Lahoma Crocker M.D.   On: 09/19/2015 19:26  Medications:   . sodium chloride   Intravenous Once  . acetaminophen  1,000 mg Oral BID WC  . calcium carbonate  1 tablet Oral TID  . darbepoetin (ARANESP) injection - DIALYSIS  100 mcg Intravenous Q Thu-HD  . doxercalciferol  5 mcg Intravenous Q T,Th,Sa-HD  . feeding supplement  1 Container Oral TID BM  . feeding supplement (PRO-STAT SUGAR FREE 64)  30 mL Oral Q1500  . heparin subcutaneous  5,000 Units Subcutaneous Q8H  . metoprolol tartrate  50 mg Oral BID  . mirtazapine  7.5  mg Oral QHS  . morphine  15 mg Oral Q12H  . multivitamin  1 tablet Oral QHS  . naproxen  250 mg Oral TID WC  . polyethylene glycol  17 g Oral Daily  . senna-docusate  2 tablet Oral BID  . sodium chloride flush  3 mL Intravenous Q12H  . traMADol  50 mg Oral BID  . valACYclovir  500 mg Oral Daily     Assessment/Plan:  1. FUOculture neg - likely tumor related; new lung masses c/w per progression oer ONC; ID following. HD catheter removed 09/17/15. Per ID,  rec.starting naprosyn for probable tumor fever. BLE doppler done this AM-no evidence of DVTHIV and hep panel neg, CMV positive < 200 and EBV <18 . Tmax today 96.6 WBC down to 17.1 2. ESRD- TTS @ [facility]. HD yesterday. K+ 3.5 this AM.  3. Anemia- HGB 7.1 today. Needs transfusion.  Has rec'd units PRBCs  this admit- Aranesp 100 q Thursday. Follow HGB  4. Secondary hyperparathyroidism-had very low Ca after denosumab (given for skeletal protection to myeloma pts);on Tums/ Hect/ high Ca bath. Ca 10.2 5.HTN/volume- HD 09/19/15, Pre Wt 54.6 kg Net UF 500 Post wt 54.7. No edema. BP stable.  6. Severe PCM-alb 1.2- liberalizeddiet to regular + fluid restriction - RD consult K and P not an issue 7. MM-chemo plans in future per heme-onc. Dr. Magrinat following.  8. AMS- pt is minimally responsive, nonverbal, and not eating or moving.  CT scan negative for Intracranial process and ABG normal.  Glucose 92.  Unclear etiology, however progression of his malignancy is worrisome.  He has not responded to chemo and has had a continued deterioration of his clinical status.  Agree with palliative care consult to help set goals and limits of care.  I am concerned that his current condition is terminal despite our best efforts. Calcium levels improved following HD with low calcium dialysate but remain elevated consistent with ongoing MM and bone involvement.  Will discontinue hectoral and follow.  Rita H. Brown NP-C 09/20/2015, 10:17 AM  [facility] [phone]  I have seen and examined this patient and agree with plan and assessment in the above note with renal recommendations/intervention highlighted.  Hold off on blood transfusion today but if continues to drop would transfuse and follow.   A ,MD 09/20/2015 11:42 AM      

## 2015-09-20 NOTE — Progress Notes (Signed)
PROGRESS NOTE        PATIENT DETAILS Name: Charles [name] Age: 40 y.o. Sex: male Date of Birth: 1975/11/11 Admit Date: 09/10/2015 Admitting Physician Roney Jaffe, MD YFE:ETOLNT, Gabrielle Dare, MD  Brief Narrative: [name] with history of multiple myeloma on outpatient chemotherapy, ESRD on hemodialysis (TTS) admitted for evaluation of fever of unknown origin. Blood cultures negative so far. CT of the chest, along with abdomen and pelvis negative for any infection. Thought to have  tumor fever at this time-has no foci of infection currently apparent-has been started on naproxen on 8/17 with no further fever. Unfortunately, he has now become very weak and deconditioned due to his acute illness. Unfortunately he continues to have clinical deterioration, palliative medicine consulted, and patient was transitioned to comfort care.  Subjective: Lethargic, sleepy,  Assessment/Plan: Principal Problem: Sepsis due to fever of unknown origin:  no further fever since starting naproxen. Blood cultures negative and no foci of infection evident on CT chest and abdomen. Previously was empirically on vancomycin and Zosyn, no longer on antibiotics-felt to have tumor fever  Active Problems: FUO: Blood cultures negative,  no infective foci seen on CT of the abdomen and chest. HIV and acute hepatitis panel negative. CMV/EBV serology negative. Hemodialysis catheter was removed on 8/17. After extensive workup with no findings, started on naproxen for presumed tumor fever on 8/17-he no longer is febrile. Current working diagnosis is FUO was likely secondary to tumor fever. No further recommendations from infectious disease, continue naproxen.  Mild sinus tachycardia: Suspect secondary to ongoing fever, TSH within normal limits.  No chest pain or shortness of breath to indicate venous thromboembolism. Lower extremity Dopplers negative for DVT. No further tachycardia  as fever has resolved. Monitor periodically.   ESRD: On hemodialysis-TTS-nephrology following. Hemodialysis catheter was removed on 8/17-to see if this would improve his fever-however blood cultures were negative. Catheter tip cultures are pending-please follow - Currently comfort care, no further hemodialysis  Anemia: Multifactorial-likely secondary to acute illness-with underlying anemia of chronic disease from malignancy and ESRD.  - Hemoglobin 7.1, no further transfusion as his comfort care  Leukocytosis: Not sure if this could be explained by tumor fever-but it is not down trending without any antimicrobial agents.   History of multiple myeloma with pleural-based lung metastases: Followed by oncology in the outpatient setting-chemotherapy currently on hold. On prophylactic Valtrex.  Hypertension: Controlled with metoprolol  Chronic pain syndrome: Management. Palliative medicine   Malnutrition of moderate degree: Continue supplements  Deconditioning: Currently comfort care  Goals of care: - Palliative medicine consulted, they met with wife and family, the plan is to transition to comfort care  DVT Prophylaxis: Prophylactic heparin  Code Status: INR/comfort care  Family Communication: Wife at bedside  Disposition Plan: Remain inpatient  Antimicrobial agents: IV vancomycin 8/10>> 8/14 IV Zosyn 8/10>> 8/15  Procedures: None  CONSULTS:  ID and nephrology   Oncology  Time spent: 25 minutes-Greater than 50% of this time was spent in counseling, explanation of diagnosis, planning of further management, and coordination of care.  MEDICATIONS: Anti-infectives    Start     Dose/Rate Route Frequency Ordered Stop   09/14/15 1300  vancomycin (VANCOCIN) IVPB 750 mg/150 ml premix  Status:  Discontinued     750 mg 150 mL/hr over 60 Minutes Intravenous  Once 09/14/15 1250 09/14/15 1506  09/12/15 1400  valACYclovir (VALTREX) tablet 500 mg  Status:  Discontinued     500 mg  Oral Daily 09/12/15 1104 09/20/15 1256   09/12/15 1200  vancomycin (VANCOCIN) 500 mg in sodium chloride 0.9 % 100 mL IVPB  Status:  Discontinued     500 mg 100 mL/hr over 60 Minutes Intravenous Every T-Th-Sa (Hemodialysis) 09/10/15 1959 09/12/15 0806   09/12/15 1200  vancomycin (VANCOCIN) IVPB 750 mg/150 ml premix  Status:  Discontinued     750 mg 150 mL/hr over 60 Minutes Intravenous Every T-Th-Sa (Hemodialysis) 09/12/15 0806 09/14/15 1506   09/12/15 1017  Vancomycin (VANCOCIN) 750-5 MG/150ML-% IVPB    Comments:  [name]   : cabinet override      09/12/15 1017 09/12/15 1032   09/11/15 0600  piperacillin-tazobactam (ZOSYN) IVPB 3.375 g  Status:  Discontinued     3.375 g 12.5 mL/hr over 240 Minutes Intravenous Every 12 hours 09/10/15 1959 09/15/15 1027   09/11/15 0530  vancomycin (VANCOCIN) 500 mg in sodium chloride 0.9 % 100 mL IVPB  Status:  Discontinued     500 mg 100 mL/hr over 60 Minutes Intravenous Every 12 hours 09/10/15 1656 09/10/15 1950   09/10/15 2330  piperacillin-tazobactam (ZOSYN) IVPB 3.375 g  Status:  Discontinued     3.375 g 12.5 mL/hr over 240 Minutes Intravenous Every 8 hours 09/10/15 1656 09/10/15 1950   09/10/15 1700  piperacillin-tazobactam (ZOSYN) IVPB 3.375 g  Status:  Discontinued     3.375 g 100 mL/hr over 30 Minutes Intravenous  Once 09/10/15 1656 09/10/15 1701   09/10/15 1700  vancomycin (VANCOCIN) IVPB 1000 mg/200 mL premix     1,000 mg 200 mL/hr over 60 Minutes Intravenous  Once 09/10/15 1656 09/10/15 1801   09/10/15 1645  piperacillin-tazobactam (ZOSYN) IVPB 3.375 g     3.375 g 100 mL/hr over 30 Minutes Intravenous  Once 09/10/15 1637 09/10/15 1721   09/10/15 1645  vancomycin (VANCOCIN) IVPB 1000 mg/200 mL premix  Status:  Discontinued     1,000 mg 200 mL/hr over 60 Minutes Intravenous  Once 09/10/15 1637 09/10/15 1701      Scheduled Meds: . sodium chloride   Intravenous Once  . sodium chloride flush  3 mL Intravenous Q12H   Continuous  Infusions:  PRN Meds:.sodium chloride, acetaminophen **OR** acetaminophen, glycopyrrolate, haloperidol lactate, HYDROmorphone (DILAUDID) injection, LORazepam, [DISCONTINUED] ondansetron **OR** ondansetron (ZOFRAN) IV, sodium chloride flush   PHYSICAL EXAM: Vital signs: Vitals:   09/19/15 1809 09/19/15 2015 09/20/15 0619 09/20/15 0832  BP: 116/68 134/82 130/81 133/80  Pulse: (!) 121 (!) 119 (!) 116 (!) 123  Resp: _0 Temp: 99.5 F (37.5 C) 97 F (36.1 C) 98.9 F (37.2 C) (!) 100.6 F (38.1 C)  TempSrc: Oral  Oral Oral  SpO2: 95% 99% 96% 95%  Weight:      Height:       Filed Weights   09/18/15 2007 09/19/15 0655 09/19/15 1109  Weight: 56.3 kg (124 lb 1.6 oz) 54.6 kg (120 lb 5.9 oz) 54.7 kg (120 lb 9.5 oz)   Body mass index is 22.06 kg/m.   Gen Exam: Lethargic, frail, chronically ill-appearing. Very deconditioned and weak. Neck: Supple, No JVD.   Chest: B/L Clear.  CVS: S1 S2 Regular Abdomen: soft, BS +, non tender, non distended.  Extremities: no edema, lower extremities warm to touch. Neurologic: Non Focal.   Skin: No Rash or lesions   Wounds: N/A.   I have personally reviewed following labs  and imaging studies  LABORATORY DATA: CBC:  Recent Labs Lab 09/15/15 0710 09/17/15 0539 09/18/15 0731 09/19/15 0412 09/20/15 0539  WBC 19.2* 21.1* 17.1* 22.3* 19.3*  HGB 9.6* 11.2* 9.3* 8.7* 7.1*  HCT 30.6* 35.5* 29.5* 28.5* 23.6*  MCV 94.4 95.7 94.9 97.6 98.7  PLT 206 183 171 198 320    Basic Metabolic Panel:  Recent Labs Lab 09/14/15 0815 09/15/15 0710 09/17/15 0539 09/18/15 0731 09/19/15 0412 09/20/15 0539  NA 135 132* 137 138 140 139  K 3.9 3.9 3.9 5.0 4.4 3.5  CL 102 97* 99* 99* 101 97*  CO2 _0 GLUCOSE 85 87 110* 83 121* 92  BUN 27* 22* 27* 32* 46* 23*  CREATININE 5.41* 5.05* 6.03* 4.76* 6.40* 4.18*  CALCIUM 7.7* 7.8* 8.8* 10.1 11.2* 10.2  PHOS 3.2 2.3* 4.7* 4.9* 4.8*  --     GFR: Estimated Creatinine Clearance: 18.1  mL/min (by C-G formula based on SCr of 4.18 mg/dL).  Liver Function Tests:  Recent Labs Lab 09/14/15 0815 09/15/15 0710 09/17/15 0539 09/18/15 0731 09/19/15 0412  ALBUMIN 1.2* 1.2* 1.4* 1.4* 1.4*   No results for input(s): LIPASE, AMYLASE in the last 168 hours.  Recent Labs Lab 09/20/15 0759  AMMONIA 33    Coagulation Profile: No results for input(s): INR, PROTIME in the last 168 hours.  Cardiac Enzymes: No results for input(s): CKTOTAL, CKMB, CKMBINDEX, TROPONINI in the last 168 hours.  BNP (last 3 results) No results for input(s): PROBNP in the last 8760 hours.  HbA1C: No results for input(s): HGBA1C in the last 72 hours.  CBG:  Recent Labs Lab 09/17/15 0851  GLUCAP 99    Lipid Profile: No results for input(s): CHOL, HDL, LDLCALC, TRIG, CHOLHDL, LDLDIRECT in the last 72 hours.  Thyroid Function Tests: No results for input(s): TSH, T4TOTAL, FREET4, T3FREE, THYROIDAB in the last 72 hours.  Anemia Panel: No results for input(s): VITAMINB12, FOLATE, FERRITIN, TIBC, IRON, RETICCTPCT in the last 72 hours.  Urine analysis:    Component Value Date/Time   COLORURINE BROWN (A) 09/11/2015 2252   APPEARANCEUR TURBID (A) 09/11/2015 2252   LABSPEC 1.016 09/11/2015 2252   LABSPEC 1.030 01/22/2015 1018   PHURINE 7.5 09/11/2015 2252   GLUCOSEU 100 (A) 09/11/2015 2252   GLUCOSEU Negative 01/22/2015 1018   HGBUR LARGE (A) 09/11/2015 2252   BILIRUBINUR MODERATE (A) 09/11/2015 2252   BILIRUBINUR Negative 01/22/2015 1018   KETONESUR 15 (A) 09/11/2015 2252   PROTEINUR >300 (A) 09/11/2015 2252   UROBILINOGEN 0.2 01/22/2015 1018   NITRITE NEGATIVE 09/11/2015 2252   LEUKOCYTESUR MODERATE (A) 09/11/2015 2252   LEUKOCYTESUR Color Interference due to blood in urine 01/22/2015 1018    Sepsis Labs: Lactic Acid, Venous    Component Value Date/Time   LATICACIDVEN 1.4 09/12/2015 1254    MICROBIOLOGY: Recent Results (from the past 240 hour(s))  Blood Culture (routine x  2)     Status: None   Collection Time: 09/10/15  4:40 PM  Result Value Ref Range Status   Specimen Description BLOOD RIGHT ANTECUBITAL  Final   Special Requests BOTTLES DRAWN AEROBIC AND ANAEROBIC 5CC  Final   Culture NO GROWTH 5 DAYS  Final   Report Status 09/15/2015 FINAL  Final  Blood Culture (routine x 2)     Status: None   Collection Time: 09/10/15  4:52 PM  Result Value Ref Range Status   Specimen Description BLOOD RIGHT HAND  Final   Special Requests BOTTLES DRAWN  AEROBIC ONLY 10CC  Final   Culture NO GROWTH 5 DAYS  Final   Report Status 09/15/2015 FINAL  Final  MRSA PCR Screening     Status: None   Collection Time: 09/10/15 11:09 PM  Result Value Ref Range Status   MRSA by PCR NEGATIVE NEGATIVE Final    Comment:        The GeneXpert MRSA Assay (FDA approved for NASAL specimens only), is one component of a comprehensive MRSA colonization surveillance program. It is not intended to diagnose MRSA infection nor to guide or monitor treatment for MRSA infections.   Culture, Urine     Status: Abnormal   Collection Time: 09/11/15 11:28 AM  Result Value Ref Range Status   Specimen Description URINE, RANDOM  Final   Special Requests NONE  Final   Culture 1,000 COLONIES/mL INSIGNIFICANT GROWTH (A)  Final   Report Status 09/13/2015 FINAL  Final  Cath Tip Culture     Status: None (Preliminary result)   Collection Time: 09/17/15  1:55 PM  Result Value Ref Range Status   Specimen Description CATH TIP  Final   Special Requests NONE  Final   Culture NO GROWTH 3 DAYS  Final   Report Status PENDING  Incomplete    RADIOLOGY STUDIES/RESULTS: Ct Abdomen Pelvis Wo Contrast  Result Date: 09/15/2015 CLINICAL DATA:  Fever of unknown origin, acute onset. Initial encounter. EXAM: CT ABDOMEN AND PELVIS WITHOUT CONTRAST TECHNIQUE: Multidetector CT imaging of the abdomen and pelvis was performed following the standard protocol without IV contrast. COMPARISON:  CT of the chest, abdomen and  pelvis performed 06/30/2015 FINDINGS: Small bilateral pleural effusions are noted. Bibasilar opacities likely reflect atelectasis, though pneumonia could have a similar appearance. The liver and spleen are unremarkable in appearance. Trace fluid adjacent to the gallbladder is nonspecific. The gallbladder is within normal limits. The pancreas and adrenal glands are unremarkable. A 1.5 cm left renal cyst is noted. There is no evidence of hydronephrosis. No renal or ureteral stones are seen. No perinephric stranding is appreciated. No free fluid is identified. The small bowel is unremarkable in appearance. The stomach is within normal limits. No acute vascular abnormalities are seen. The appendix is normal in caliber and contains air, without evidence of appendicitis. The colon is grossly unremarkable in appearance. The bladder is decompressed and not well assessed. The prostate remains normal in size. No inguinal lymphadenopathy is seen. Presacral stranding is noted. No acute osseous abnormalities are identified. Previously noted changes of multiple myeloma are again seen. IMPRESSION: 1. Small bilateral pleural effusions noted. Bibasilar airspace opacities likely reflect atelectasis, though pneumonia could have a similar appearance. 2. Trace fluid adjacent to the gallbladder is nonspecific. The gallbladder is grossly unremarkable in appearance. 3. Small left renal cyst noted. 4. Presacral stranding noted. 5. Mild changes of multiple myeloma again noted within the visualized osseous structures. Electronically Signed   By: Garald Balding M.D.   On: 09/15/2015 03:12   Dg Chest 2 View  Result Date: 09/10/2015 CLINICAL DATA:  Fever and weakness. EXAM: CHEST  2 VIEW COMPARISON:  Multiple chest x-rays since July 06, 2015 FINDINGS: No pneumothorax. Bilateral pleural effusions and underlying opacities are identified. A rounded density in the lateral left lower lung measures up to 3.2 cm and is masslike in appearance.  However, this was not present on multiple recent chest x-rays. No other nodules or masses. No overt edema. A right dialysis catheter is again identified. The cardiomediastinal silhouette is stable. IMPRESSION: 1. Rounded masslike  low-attenuation density in the lateral left lung not seen on multiple recent chest x-rays. This may represent some loculated fluid. Recommend short-term follow-up to ensure resolution. 2. Bilateral pleural effusions with underlying atelectasis. 3. No other acute abnormalities. Electronically Signed   By: Dorise Bullion III M.D   On: 09/10/2015 17:28   Ct Head Wo Contrast  Result Date: 09/19/2015 CLINICAL DATA:  Altered mental status EXAM: CT HEAD WITHOUT CONTRAST TECHNIQUE: Contiguous axial images were obtained from the base of the skull through the vertex without intravenous contrast. COMPARISON:  07/06/2015 FINDINGS: Brain: No intracranial hemorrhage, mass effect or midline shift. No acute cortical infarction. Ventricular size is stable from prior exam. No intraparenchymal brain mass is noted. Vascular: No hyperdense vessel or unexpected calcification. Skull: Again noted lytic skull lesion with soft tissue component in left parietal region consistent with multiple myeloma. There is no significant change from prior exam. Additional smaller lesions are noted in left and right parietal region. Sinuses/Orbits: Paranasal sinuses and mastoid air cells are unremarkable. Other: None IMPRESSION: No acute intracranial abnormality. No acute cortical infarction. Again noted lytic calvarial lesions consistent with multiple myeloma. Electronically Signed   By: Lahoma Crocker M.D.   On: 09/19/2015 19:26   Ct Chest Wo Contrast  Result Date: 09/12/2015 CLINICAL DATA:  Evaluate for mass within the chest. EXAM: CT CHEST WITHOUT CONTRAST TECHNIQUE: Multidetector CT imaging of the chest was performed following the standard protocol without IV contrast. COMPARISON:  Chest radiograph 09/10/2015; chest CT  06/30/2015 FINDINGS: Cardiovascular: Heart is enlarged. Aorta and main pulmonary artery normal in caliber. Central venous catheter tip terminates at the superior cavoatrial junction. Mediastinum/Nodes: No axillary, mediastinal or hilar lymphadenopathy. Mild heterogeneity of the thyroid, unchanged. Esophagus is unremarkable. Lungs/Pleura: Central airways are patent. Subpleural ground-glass and consolidative opacities within the right and left lower lobes. Moderate layering bilateral pleural effusions, increased from prior. No pneumothorax. Motion artifact limits evaluation. There are multiple pleural-based masses demonstrated within both the left and right hemi thorax. A few of these have increased in size measuring up to 2.4 x 1.2 cm within the anterior left hemi thorax (image 66; series 2) and up to 1.3 x 2.7 cm in the lateral left hemi thorax (image 74; series 2). These are associated with underlying osseous destruction of the adjacent ribs including the anterior left second rib, posterior left third rib, the lateral left fifth rib and posterior seventh and ninth ribs. Upper Abdomen: Unremarkable Musculoskeletal: Re- demonstrated lytic lesions within the sternum. Additionally there multiple osseous destructive lesions involving predominantly the left ribs as described above associated with soft tissue masses, most compatible patient's history of multiple myeloma. There are few patchy lucencies throughout the thoracic spine, most notable within the left T4 transverse process. IMPRESSION: Interval increase in size of multiple predominately left-sided pleural based soft tissue masses with associated underlying osseous destruction, most compatible patient's history of multiple myeloma. Persistent lytic lesion within the sternum/manubrium. Moderate layering bilateral pleural effusions, increased from prior, with underlying pulmonary consolidation most compatible with associated atelectasis. Electronically Signed   By:  Lovey Newcomer M.D.   On: 09/12/2015 07:56   Ir Removal Tun Cv Cath W/o Fl  Result Date: 09/18/2015 INDICATION: End-stage renal disease. Fever. Patient also has functional permanent hemodialysis access. Request removal of tunneled hemodialysis catheter EXAM: REMOVAL TUNNELED CENTRAL HEMODIALYSIS CATHETER MEDICATIONS: None; ANESTHESIA/SEDATION: Moderate Sedation Time: None The patient's level of consciousness and vital signs were monitored continuously by radiology nursing throughout the procedure under my direct supervision. FLUOROSCOPY  TIME:  Fluoroscopy Time: None COMPLICATIONS: None immediate. PROCEDURE: Informed written consent was obtained from the patient after a thorough discussion of the procedural risks, benefits and alternatives. All questions were addressed. Maximal Sterile Barrier Technique was utilized including caps, mask, sterile gowns, sterile gloves, sterile drape, hand hygiene and skin antiseptic. A timeout was performed prior to the initiation of the procedure. The patient's right chest and catheter was prepped and draped in a normal sterile fashion. Heparin was removed from both ports of catheter. 1% lidocaine was used for local anesthesia. Using gentle blunt dissection the cuff of the catheter was exposed and the catheter was removed in it's entirety. Pressure was held till hemostasis was obtained. A sterile dressing was applied. The patient tolerated the procedure well with no immediate complications. IMPRESSION: Successful catheter removal as described above. Read by: Ascencion Dike PA-C Electronically Signed   By: Corrie Mckusick D.O.   On: 09/18/2015 14:41     LOS: 10 days   Yuma Pacella, MD  [facility] IPNYO:[phone]  If 7PM-7AM, please contact night-coverage [email or web address] Password [email or web address] 09/20/2015, 2:55 PM

## 2015-09-20 NOTE — Progress Notes (Signed)
Patient is now [name] with comfort care orders. Patient comfortable at this time. Family is aware and agreeable to plan of care. Patient being transferred to 6N28. Report given to [facility].

## 2015-09-21 LAB — CATH TIP CULTURE: Culture: NO GROWTH

## 2015-09-21 NOTE — Progress Notes (Signed)
PMT RN Note: Pt is non-responsive. Family at bedside. Discussed his comfort. Wife states that he woke up this am and seemed to be doing better, but he is asleep again now.   Discussed case with PMT. Prognosis only a few days due to no HD for several days and no more aggressive therapy. Plan f/u tomorrow and prn by PMT.  Marjie Skiff Lorae Roig, RN, BSN, [facility] 09/21/2015 1:30 PM Cell [phone] 8:00-4:00 Monday-Friday Office [phone]

## 2015-09-21 NOTE — Progress Notes (Signed)
PROGRESS NOTE        PATIENT DETAILS Name: Charles [name] Age: 40 y.o. Sex: male Date of Birth: Oct 03, 1975 Admit Date: 09/10/2015 Admitting Physician Roney Jaffe, MD PPJ:[id number], Gabrielle Dare, MD  Brief Narrative: [name] with history of multiple myeloma on outpatient chemotherapy, ESRD on hemodialysis (TTS) admitted for evaluation of fever of unknown origin. Blood cultures negative so far. CT of the chest, along with abdomen and pelvis negative for any infection. Thought to have  tumor fever at this time-has no foci of infection currently apparent-has been started on naproxen on 8/17 with no further fever. Unfortunately, he has now become very weak and deconditioned due to his acute illness. Unfortunately he continues to have clinical deterioration, palliative medicine consulted, and patient was transitioned to comfort care.  Subjective: Lethargic, sleepy,  Assessment/Plan: Principal Problem: Sepsis due to fever of unknown origin:  no further fever since starting naproxen. Blood cultures negative and no foci of infection evident on CT chest and abdomen. Previously was empirically on vancomycin and Zosyn, no longer on antibiotics-felt to have tumor fever  Active Problems: FUO: Blood cultures negative,  no infective foci seen on CT of the abdomen and chest. HIV and acute hepatitis panel negative. CMV/EBV serology negative. Hemodialysis catheter was removed on 8/17. After extensive workup with no findings, started on naproxen for presumed tumor fever on 8/17-he no longer is febrile. Current working diagnosis is FUO was likely secondary to tumor fever. No further recommendations from infectious disease, continue naproxen.  Mild sinus tachycardia: Suspect secondary to ongoing fever, TSH within normal limits.  No chest pain or shortness of breath to indicate venous thromboembolism. Lower extremity Dopplers negative for DVT. No further tachycardia  as fever has resolved. Monitor periodically.   ESRD: On hemodialysis-TTS-nephrology following. Hemodialysis catheter was removed on 8/17-to see if this would improve his fever-however blood cultures were negative. Catheter tip cultures are pending-please follow - Currently comfort care, no further hemodialysis  Anemia: Multifactorial-likely secondary to acute illness-with underlying anemia of chronic disease from malignancy and ESRD.  - Hemoglobin 7.1, no further transfusion as his comfort care  Leukocytosis: Not sure if this could be explained by tumor fever-but it is not down trending without any antimicrobial agents.   History of multiple myeloma with pleural-based lung metastases: Followed by oncology in the outpatient setting-chemotherapy currently on hold. On prophylactic Valtrex.  Hypertension: Controlled with metoprolol  Chronic pain syndrome: Management. Palliative medicine   Malnutrition of moderate degree: Continue supplements  Deconditioning: Currently comfort care  Goals of care: - Palliative medicine consulted, they met with wife and family, the plan is to transition to comfort care  DVT Prophylaxis: Comfort  Code Status: INR/comfort care  Family Communication: Wife in multiple family members at bedside  Disposition Plan: Remain inpatient  Antimicrobial agents: IV vancomycin 8/10>> 8/14 IV Zosyn 8/10>> 8/15  Procedures: None  CONSULTS:  ID and nephrology   Oncology Palliative medicine   MEDICATIONS: Anti-infectives    Start     Dose/Rate Route Frequency Ordered Stop   09/14/15 1300  vancomycin (VANCOCIN) IVPB 750 mg/150 ml premix  Status:  Discontinued     750 mg 150 mL/hr over 60 Minutes Intravenous  Once 09/14/15 1250 09/14/15 1506   09/12/15 1400  valACYclovir (VALTREX) tablet 500 mg  Status:  Discontinued     500 mg Oral  Daily 09/12/15 1104 09/20/15 1256   09/12/15 1200  vancomycin (VANCOCIN) 500 mg in sodium chloride 0.9 % 100 mL IVPB   Status:  Discontinued     500 mg 100 mL/hr over 60 Minutes Intravenous Every T-Th-Sa (Hemodialysis) 09/10/15 1959 09/12/15 0806   09/12/15 1200  vancomycin (VANCOCIN) IVPB 750 mg/150 ml premix  Status:  Discontinued     750 mg 150 mL/hr over 60 Minutes Intravenous Every T-Th-Sa (Hemodialysis) 09/12/15 0806 09/14/15 1506   09/12/15 1017  Vancomycin (VANCOCIN) 750-5 MG/150ML-% IVPB    Comments:  [name]   : cabinet override      09/12/15 1017 09/12/15 1032   09/11/15 0600  piperacillin-tazobactam (ZOSYN) IVPB 3.375 g  Status:  Discontinued     3.375 g 12.5 mL/hr over 240 Minutes Intravenous Every 12 hours 09/10/15 1959 09/15/15 1027   09/11/15 0530  vancomycin (VANCOCIN) 500 mg in sodium chloride 0.9 % 100 mL IVPB  Status:  Discontinued     500 mg 100 mL/hr over 60 Minutes Intravenous Every 12 hours 09/10/15 1656 09/10/15 1950   09/10/15 2330  piperacillin-tazobactam (ZOSYN) IVPB 3.375 g  Status:  Discontinued     3.375 g 12.5 mL/hr over 240 Minutes Intravenous Every 8 hours 09/10/15 1656 09/10/15 1950   09/10/15 1700  piperacillin-tazobactam (ZOSYN) IVPB 3.375 g  Status:  Discontinued     3.375 g 100 mL/hr over 30 Minutes Intravenous  Once 09/10/15 1656 09/10/15 1701   09/10/15 1700  vancomycin (VANCOCIN) IVPB 1000 mg/200 mL premix     1,000 mg 200 mL/hr over 60 Minutes Intravenous  Once 09/10/15 1656 09/10/15 1801   09/10/15 1645  piperacillin-tazobactam (ZOSYN) IVPB 3.375 g     3.375 g 100 mL/hr over 30 Minutes Intravenous  Once 09/10/15 1637 09/10/15 1721   09/10/15 1645  vancomycin (VANCOCIN) IVPB 1000 mg/200 mL premix  Status:  Discontinued     1,000 mg 200 mL/hr over 60 Minutes Intravenous  Once 09/10/15 1637 09/10/15 1701      Scheduled Meds: . sodium chloride   Intravenous Once  . sodium chloride flush  3 mL Intravenous Q12H   Continuous Infusions:  PRN Meds:.sodium chloride, acetaminophen **OR** acetaminophen, glycopyrrolate, haloperidol lactate, HYDROmorphone  (DILAUDID) injection, LORazepam, [DISCONTINUED] ondansetron **OR** ondansetron (ZOFRAN) IV, sodium chloride flush   PHYSICAL EXAM: Vital signs: Vitals:   09/20/15 0832 09/20/15 1510 09/21/15 0607 09/21/15 1438  BP: 133/80 (!) 143/78 (!) 146/84 (!) 161/101  Pulse: (!) 123 (!) 126 (!) 117 (!) 127  Resp: 16 19 17 18   Temp: (!) 100.6 F (38.1 C) 100.2 F (37.9 C) 98.6 F (37 C) (!) 101.5 F (38.6 C)  TempSrc: Oral Oral Axillary Oral  SpO2: 95% 93% 94% 92%  Weight:  52.6 kg (115 lb 15.4 oz)    Height:       Filed Weights   09/19/15 0655 09/19/15 1109 09/20/15 1510  Weight: 54.6 kg (120 lb 5.9 oz) 54.7 kg (120 lb 9.5 oz) 52.6 kg (115 lb 15.4 oz)   Body mass index is 21.21 kg/m.   Gen Exam: more Awake today, frail, chronically ill-appearing. Very deconditioned and weak. Neck: Supple, No JVD.   Chest: B/L Clear.  CVS: S1 S2 Regular Abdomen: soft, BS +, non tender, non distended.  Extremities: no edema, lower extremities warm to touch. Neurologic: Non Focal.   Skin: No Rash or lesions   Wounds: N/A.   I have personally reviewed following labs and imaging studies  LABORATORY DATA: CBC:  Recent Labs Lab 09/15/15 0710 09/17/15 0539 09/18/15 0731 09/19/15 0412 09/20/15 0539  WBC 19.2* 21.1* 17.1* 22.3* 19.3*  HGB 9.6* 11.2* 9.3* 8.7* 7.1*  HCT 30.6* 35.5* 29.5* 28.5* 23.6*  MCV 94.4 95.7 94.9 97.6 98.7  PLT 206 183 171 198 676    Basic Metabolic Panel:  Recent Labs Lab 09/15/15 0710 09/17/15 0539 09/18/15 0731 09/19/15 0412 09/20/15 0539  NA 132* 137 138 140 139  K 3.9 3.9 5.0 4.4 3.5  CL 97* 99* 99* 101 97*  CO2 24 26 26 26 29   GLUCOSE 87 110* 83 121* 92  BUN 22* 27* 32* 46* 23*  CREATININE 5.05* 6.03* 4.76* 6.40* 4.18*  CALCIUM 7.8* 8.8* 10.1 11.2* 10.2  PHOS 2.3* 4.7* 4.9* 4.8*  --     GFR: Estimated Creatinine Clearance: 17.5 mL/min (by C-G formula based on SCr of 4.18 mg/dL).  Liver Function Tests:  Recent Labs Lab 09/15/15 0710  09/17/15 0539 09/18/15 0731 09/19/15 0412  ALBUMIN 1.2* 1.4* 1.4* 1.4*   No results for input(s): LIPASE, AMYLASE in the last 168 hours.  Recent Labs Lab 09/20/15 0759  AMMONIA 33    Coagulation Profile: No results for input(s): INR, PROTIME in the last 168 hours.  Cardiac Enzymes: No results for input(s): CKTOTAL, CKMB, CKMBINDEX, TROPONINI in the last 168 hours.  BNP (last 3 results) No results for input(s): PROBNP in the last 8760 hours.  HbA1C: No results for input(s): HGBA1C in the last 72 hours.  CBG:  Recent Labs Lab 09/17/15 0851  GLUCAP 99    Lipid Profile: No results for input(s): CHOL, HDL, LDLCALC, TRIG, CHOLHDL, LDLDIRECT in the last 72 hours.  Thyroid Function Tests: No results for input(s): TSH, T4TOTAL, FREET4, T3FREE, THYROIDAB in the last 72 hours.  Anemia Panel: No results for input(s): VITAMINB12, FOLATE, FERRITIN, TIBC, IRON, RETICCTPCT in the last 72 hours.  Urine analysis:    Component Value Date/Time   COLORURINE BROWN (A) 09/11/2015 2252   APPEARANCEUR TURBID (A) 09/11/2015 2252   LABSPEC 1.016 09/11/2015 2252   LABSPEC 1.030 01/22/2015 1018   PHURINE 7.5 09/11/2015 2252   GLUCOSEU 100 (A) 09/11/2015 2252   GLUCOSEU Negative 01/22/2015 1018   HGBUR LARGE (A) 09/11/2015 2252   BILIRUBINUR MODERATE (A) 09/11/2015 2252   BILIRUBINUR Negative 01/22/2015 1018   KETONESUR 15 (A) 09/11/2015 2252   PROTEINUR >300 (A) 09/11/2015 2252   UROBILINOGEN 0.2 01/22/2015 1018   NITRITE NEGATIVE 09/11/2015 2252   LEUKOCYTESUR MODERATE (A) 09/11/2015 2252   LEUKOCYTESUR Color Interference due to blood in urine 01/22/2015 1018    Sepsis Labs: Lactic Acid, Venous    Component Value Date/Time   LATICACIDVEN 1.4 09/12/2015 1254    MICROBIOLOGY: Recent Results (from the past 240 hour(s))  Cath Tip Culture     Status: None   Collection Time: 09/17/15  1:55 PM  Result Value Ref Range Status   Specimen Description CATH TIP  Final   Special  Requests NONE  Final   Culture NO GROWTH 3 DAYS  Final   Report Status 09/21/2015 FINAL  Final    RADIOLOGY STUDIES/RESULTS: Ct Abdomen Pelvis Wo Contrast  Result Date: 09/15/2015 CLINICAL DATA:  Fever of unknown origin, acute onset. Initial encounter. EXAM: CT ABDOMEN AND PELVIS WITHOUT CONTRAST TECHNIQUE: Multidetector CT imaging of the abdomen and pelvis was performed following the standard protocol without IV contrast. COMPARISON:  CT of the chest, abdomen and pelvis performed 06/30/2015 FINDINGS: Small bilateral pleural effusions are noted. Bibasilar opacities likely  reflect atelectasis, though pneumonia could have a similar appearance. The liver and spleen are unremarkable in appearance. Trace fluid adjacent to the gallbladder is nonspecific. The gallbladder is within normal limits. The pancreas and adrenal glands are unremarkable. A 1.5 cm left renal cyst is noted. There is no evidence of hydronephrosis. No renal or ureteral stones are seen. No perinephric stranding is appreciated. No free fluid is identified. The small bowel is unremarkable in appearance. The stomach is within normal limits. No acute vascular abnormalities are seen. The appendix is normal in caliber and contains air, without evidence of appendicitis. The colon is grossly unremarkable in appearance. The bladder is decompressed and not well assessed. The prostate remains normal in size. No inguinal lymphadenopathy is seen. Presacral stranding is noted. No acute osseous abnormalities are identified. Previously noted changes of multiple myeloma are again seen. IMPRESSION: 1. Small bilateral pleural effusions noted. Bibasilar airspace opacities likely reflect atelectasis, though pneumonia could have a similar appearance. 2. Trace fluid adjacent to the gallbladder is nonspecific. The gallbladder is grossly unremarkable in appearance. 3. Small left renal cyst noted. 4. Presacral stranding noted. 5. Mild changes of multiple myeloma again  noted within the visualized osseous structures. Electronically Signed   By: Garald Balding M.D.   On: 09/15/2015 03:12   Dg Chest 2 View  Result Date: 09/10/2015 CLINICAL DATA:  Fever and weakness. EXAM: CHEST  2 VIEW COMPARISON:  Multiple chest x-rays since July 06, 2015 FINDINGS: No pneumothorax. Bilateral pleural effusions and underlying opacities are identified. A rounded density in the lateral left lower lung measures up to 3.2 cm and is masslike in appearance. However, this was not present on multiple recent chest x-rays. No other nodules or masses. No overt edema. A right dialysis catheter is again identified. The cardiomediastinal silhouette is stable. IMPRESSION: 1. Rounded masslike low-attenuation density in the lateral left lung not seen on multiple recent chest x-rays. This may represent some loculated fluid. Recommend short-term follow-up to ensure resolution. 2. Bilateral pleural effusions with underlying atelectasis. 3. No other acute abnormalities. Electronically Signed   By: Dorise Bullion III M.D   On: 09/10/2015 17:28   Ct Head Wo Contrast  Result Date: 09/19/2015 CLINICAL DATA:  Altered mental status EXAM: CT HEAD WITHOUT CONTRAST TECHNIQUE: Contiguous axial images were obtained from the base of the skull through the vertex without intravenous contrast. COMPARISON:  07/06/2015 FINDINGS: Brain: No intracranial hemorrhage, mass effect or midline shift. No acute cortical infarction. Ventricular size is stable from prior exam. No intraparenchymal brain mass is noted. Vascular: No hyperdense vessel or unexpected calcification. Skull: Again noted lytic skull lesion with soft tissue component in left parietal region consistent with multiple myeloma. There is no significant change from prior exam. Additional smaller lesions are noted in left and right parietal region. Sinuses/Orbits: Paranasal sinuses and mastoid air cells are unremarkable. Other: None IMPRESSION: No acute intracranial  abnormality. No acute cortical infarction. Again noted lytic calvarial lesions consistent with multiple myeloma. Electronically Signed   By: Lahoma Crocker M.D.   On: 09/19/2015 19:26   Ct Chest Wo Contrast  Result Date: 09/12/2015 CLINICAL DATA:  Evaluate for mass within the chest. EXAM: CT CHEST WITHOUT CONTRAST TECHNIQUE: Multidetector CT imaging of the chest was performed following the standard protocol without IV contrast. COMPARISON:  Chest radiograph 09/10/2015; chest CT 06/30/2015 FINDINGS: Cardiovascular: Heart is enlarged. Aorta and main pulmonary artery normal in caliber. Central venous catheter tip terminates at the superior cavoatrial junction. Mediastinum/Nodes: No axillary, mediastinal or  hilar lymphadenopathy. Mild heterogeneity of the thyroid, unchanged. Esophagus is unremarkable. Lungs/Pleura: Central airways are patent. Subpleural ground-glass and consolidative opacities within the right and left lower lobes. Moderate layering bilateral pleural effusions, increased from prior. No pneumothorax. Motion artifact limits evaluation. There are multiple pleural-based masses demonstrated within both the left and right hemi thorax. A few of these have increased in size measuring up to 2.4 x 1.2 cm within the anterior left hemi thorax (image 66; series 2) and up to 1.3 x 2.7 cm in the lateral left hemi thorax (image 74; series 2). These are associated with underlying osseous destruction of the adjacent ribs including the anterior left second rib, posterior left third rib, the lateral left fifth rib and posterior seventh and ninth ribs. Upper Abdomen: Unremarkable Musculoskeletal: Re- demonstrated lytic lesions within the sternum. Additionally there multiple osseous destructive lesions involving predominantly the left ribs as described above associated with soft tissue masses, most compatible patient's history of multiple myeloma. There are few patchy lucencies throughout the thoracic spine, most notable  within the left T4 transverse process. IMPRESSION: Interval increase in size of multiple predominately left-sided pleural based soft tissue masses with associated underlying osseous destruction, most compatible patient's history of multiple myeloma. Persistent lytic lesion within the sternum/manubrium. Moderate layering bilateral pleural effusions, increased from prior, with underlying pulmonary consolidation most compatible with associated atelectasis. Electronically Signed   By: Lovey Newcomer M.D.   On: 09/12/2015 07:56   Ir Removal Tun Cv Cath W/o Fl  Result Date: 09/18/2015 INDICATION: End-stage renal disease. Fever. Patient also has functional permanent hemodialysis access. Request removal of tunneled hemodialysis catheter EXAM: REMOVAL TUNNELED CENTRAL HEMODIALYSIS CATHETER MEDICATIONS: None; ANESTHESIA/SEDATION: Moderate Sedation Time: None The patient's level of consciousness and vital signs were monitored continuously by radiology nursing throughout the procedure under my direct supervision. FLUOROSCOPY TIME:  Fluoroscopy Time: None COMPLICATIONS: None immediate. PROCEDURE: Informed written consent was obtained from the patient after a thorough discussion of the procedural risks, benefits and alternatives. All questions were addressed. Maximal Sterile Barrier Technique was utilized including caps, mask, sterile gowns, sterile gloves, sterile drape, hand hygiene and skin antiseptic. A timeout was performed prior to the initiation of the procedure. The patient's right chest and catheter was prepped and draped in a normal sterile fashion. Heparin was removed from both ports of catheter. 1% lidocaine was used for local anesthesia. Using gentle blunt dissection the cuff of the catheter was exposed and the catheter was removed in it's entirety. Pressure was held till hemostasis was obtained. A sterile dressing was applied. The patient tolerated the procedure well with no immediate complications. IMPRESSION:  Successful catheter removal as described above. Read by: Ascencion Dike PA-C Electronically Signed   By: Corrie Mckusick D.O.   On: 09/18/2015 14:41     LOS: 11 days   Andrena Margerum, MD  [facility] [phone]  If 7PM-7AM, please contact night-coverage [email or web address] Password [facility] 09/21/2015, 4:29 PM

## 2015-09-21 NOTE — Progress Notes (Signed)
Pt. Has been transitioned to comfort care.  It appears there is no role currently for IP Rehab.  Please call if questions.  [facility] Admissions Coordinator Cell [phone] Office [phone]

## 2015-09-21 NOTE — Progress Notes (Signed)
Nutrition Brief Note  Chart reviewed. Pt now transitioning to comfort care.  No further nutrition interventions warranted at this time.  Please re-consult as needed.   Camren Henthorn A. Famous Eisenhardt, RD, LDN, CDE Pager: [phone] After hours Pager: [phone]  

## 2015-09-21 NOTE — Progress Notes (Addendum)
[facility] Progress Note   Subjective:  Awake, very weak, answering some questions  Objective Vitals:   09/20/15 0619 09/20/15 0832 09/20/15 1510 09/21/15 0607  BP: 130/81 133/80 (!) 143/78 (!) 146/84  Pulse: (!) 116 (!) 123 (!) 126 (!) 117  Resp: 19 16 19 17   Temp: 98.9 F (37.2 C) (!) 100.6 F (38.1 C) 100.2 F (37.9 C) 98.6 F (37 C)  TempSrc: Oral Oral Oral Axillary  SpO2: 96% 95% 93% 94%  Weight:   52.6 kg (115 lb 15.4 oz)   Height:       Physical Exam General: Chronically ill appearing male, severely fatigued, verbalizes minimally , not oriented Dry mouth Heart: S1, S2, RRR HR 80s Lungs: BBS CTA A/P Resp shallow No WOB Abdomen: active BS Nontender Extremities: No LE edema Dialysis Access: LUA AVF + bruit. RIJ removed-drsg CDI.    Dialysis: [facility]   4h   58kg   2/3.5 bath   Heparin none  R IJ/ maturing AVF Aranesp 200 mcg q week (last dose 09/10/15 Last in-center HGB 8.2 09/03/15 Fe 37 Tsat 23 Ferritin 2775 08/27/15) Hectoral: 5 mcg IV q tx q T,Th, S Calcium acetate/TUMS extra strength.  Additional Objective Labs: Basic Metabolic Panel:  Recent Labs Lab 09/17/15 0539 09/18/15 0731 09/19/15 0412 09/20/15 0539  NA 137 138 140 139  K 3.9 5.0 4.4 3.5  CL 99* 99* 101 97*  CO2 26 26 26 29   GLUCOSE 110* 83 121* 92  BUN 27* 32* 46* 23*  CREATININE 6.03* 4.76* 6.40* 4.18*  CALCIUM 8.8* 10.1 11.2* 10.2  PHOS 4.7* 4.9* 4.8*  --    Liver Function Tests:  Recent Labs Lab 09/17/15 0539 09/18/15 0731 09/19/15 0412  ALBUMIN 1.4* 1.4* 1.4*   No results for input(s): LIPASE, AMYLASE in the last 168 hours. CBC:  Recent Labs Lab 09/15/15 0710 09/17/15 0539 09/18/15 0731 09/19/15 0412 09/20/15 0539  WBC 19.2* 21.1* 17.1* 22.3* 19.3*  HGB 9.6* 11.2* 9.3* 8.7* 7.1*  HCT 30.6* 35.5* 29.5* 28.5* 23.6*  MCV 94.4 95.7 94.9 97.6 98.7  PLT 206 183 171 198 177   Blood Culture    Component Value Date/Time   SDES CATH TIP 09/17/2015 1355    SPECREQUEST NONE 09/17/2015 1355   CULT NO GROWTH 3 DAYS 09/17/2015 1355   REPTSTATUS 09/21/2015 FINAL 09/17/2015 1355    Cardiac Enzymes: No results for input(s): CKTOTAL, CKMB, CKMBINDEX, TROPONINI in the last 168 hours. CBG:  Recent Labs Lab 09/17/15 0851  GLUCAP 99   Iron Studies: No results for input(s): IRON, TIBC, TRANSFERRIN, FERRITIN in the last 72 hours. @lablastinr3 @ Studies/Results: Ct Head Wo Contrast  Result Date: 09/19/2015 CLINICAL DATA:  Altered mental status EXAM: CT HEAD WITHOUT CONTRAST TECHNIQUE: Contiguous axial images were obtained from the base of the skull through the vertex without intravenous contrast. COMPARISON:  07/06/2015 FINDINGS: Brain: No intracranial hemorrhage, mass effect or midline shift. No acute cortical infarction. Ventricular size is stable from prior exam. No intraparenchymal brain mass is noted. Vascular: No hyperdense vessel or unexpected calcification. Skull: Again noted lytic skull lesion with soft tissue component in left parietal region consistent with multiple myeloma. There is no significant change from prior exam. Additional smaller lesions are noted in left and right parietal region. Sinuses/Orbits: Paranasal sinuses and mastoid air cells are unremarkable. Other: None IMPRESSION: No acute intracranial abnormality. No acute cortical infarction. Again noted lytic calvarial lesions consistent with multiple myeloma. Electronically Signed   By: Lahoma Crocker  M.D.   On: 09/19/2015 19:26   Medications:   . sodium chloride   Intravenous Once  . sodium chloride flush  3 mL Intravenous Q12H     Assessment: 1. FUOculture neg - likely tumor related; new lung masses c/w progression per ONC; HD catheter removed 09/17/15. Per ID,  rec.starting naprosyn for probable tumor fever 2. ESRD- TTS HD via AVF 3. 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 Multiple myeloma - refractory to  Rx  Plan - pt too sick for further dialysis, have d/w family, prob minimal time left and they are aware.  Inpatient hospice is probably best at this point. The wife asked if we could look into possibly getting a special VISA for family members who live in [place] (mother/ father/ sister) to come and visit , have d/w SW who will look into this.     [name] MD [name] pager [phone]    cell [phone] 09/21/2015, 11:03 AM

## 2015-09-21 NOTE — Progress Notes (Signed)
COURTESY NOTE: called by hospitalist to inform me patient now comfort care after a difficult weekend. He appeared moribund.  However this AM the patient is alert,eating and drinking, and asking when his next HD will be done and when are we going to start his next treatment.  If patient's condition again declines and he again becomes unresponsive, I am comfortable with a comfort care approach. However at this point the patient is interested in further therapy and may wish to continue HD in AM  I will drop by tomorrow AM to review situation.  Appreciate your care to this patient and his family.

## 2015-09-22 ENCOUNTER — Other Ambulatory Visit: Payer: Self-pay | Admitting: Oncology

## 2015-09-22 ENCOUNTER — Telehealth: Payer: Self-pay | Admitting: Oncology

## 2015-09-22 NOTE — Progress Notes (Signed)
   09/22/15 0900  Clinical Encounter Type  Visited With Patient and family together  Visit Type Patient actively dying  Referral From Nurse  Spiritual Encounters  Spiritual Needs Prayer;Grief support  Stress Factors  Family Stress Factors Loss;Major life changes  Chaplain visited family, spoke to wife and to children about their loss and prayed with them. Wife is particularly concerned about 41 year old son, who was not present but will be later. Chaplain will check in again. Merced Brougham, Chaplain

## 2015-09-22 NOTE — Progress Notes (Signed)
Patient is now comfort care, no further HD.  Will sign off.    [name] MD [name] 09/22/2015, 12:03 PM

## 2015-09-22 NOTE — Progress Notes (Signed)
PROGRESS NOTE        PATIENT DETAILS Name: Charles [name] Age: 40 y.o. Sex: male Date of Birth: 06/09/1975 Admit Date: 09/10/2015 Admitting Physician Roney Jaffe, MD OEC:[id number], Gabrielle Dare, MD  Brief Narrative: [name] with history of multiple myeloma on outpatient chemotherapy, ESRD on hemodialysis (TTS) admitted for evaluation of fever of unknown origin. Blood cultures negative so far. CT of the chest, along with abdomen and pelvis negative for any infection. Thought to have  tumor fever at this time-has no foci of infection currently apparent-has been started on naproxen on 8/17 with no further fever. Unfortunately, he has now become very weak and deconditioned due to his acute illness. Unfortunately he continues to have significant clinical deterioration, increased lethargy ,palliative medicine consulted, and patient was transitioned to comfort care.  Subjective: Lethargic, sleepy,  Assessment/Plan: Principal Problem: Sepsis due to fever of unknown origin:  no further fever since starting naproxen. Blood cultures negative and no foci of infection evident on CT chest and abdomen. Previously was empirically on vancomycin and Zosyn, no longer on antibiotics-felt to have tumor fever  Active Problems: FUO: Blood cultures negative,  no infective foci seen on CT of the abdomen and chest. HIV and acute hepatitis panel negative. CMV/EBV serology negative. Hemodialysis catheter was removed on 8/17. After extensive workup with no findings, started on naproxen for presumed tumor fever on 8/17-he no longer is febrile. Current working diagnosis is FUO was likely secondary to tumor fever. No further recommendations from infectious disease, continue naproxen.  Mild sinus tachycardia: Suspect secondary to ongoing fever, TSH within normal limits.  No chest pain or shortness of breath to indicate venous thromboembolism. Lower extremity Dopplers negative  for DVT. No further tachycardia as fever has resolved. Monitor periodically.   ESRD: On hemodialysis-TTS-nephrology following. Hemodialysis catheter was removed on 8/17-to see if this would improve his fever-however blood cultures were negative. Catheter tip cultures are pending-please follow - Currently comfort care, no further hemodialysis  Anemia: Multifactorial-likely secondary to acute illness-with underlying anemia of chronic disease from malignancy and ESRD.  - Hemoglobin 7.1, no further transfusion as his comfort care  Leukocytosis: Not sure if this could be explained by tumor fever-but it is not down trending without any antimicrobial agents.   History of multiple myeloma with pleural-based lung metastases: Followed by oncology in the outpatient setting-chemotherapy currently on hold.   Hypertension: Controlled with metoprolol  Chronic pain syndrome: Management. Palliative medicine   Malnutrition of moderate degree: Feeding for comfort  Deconditioning: Currently comfort care  Goals of care: - Palliative medicine consulted, they met with wife and family, patient is currently comfort care, further hemodialysis  DVT Prophylaxis: Comfort  Code Status: [name]/comfort care  Family Communication: Wife at bedside  Disposition Plan: Palliative medicine will discuss with family appropriate plan  Antimicrobial agents: IV vancomycin 8/10>> 8/14 IV Zosyn 8/10>> 8/15  Procedures: None  CONSULTS:  ID and nephrology   Oncology Palliative medicine   MEDICATIONS: Anti-infectives    Start     Dose/Rate Route Frequency Ordered Stop   09/14/15 1300  vancomycin (VANCOCIN) IVPB 750 mg/150 ml premix  Status:  Discontinued     750 mg 150 mL/hr over 60 Minutes Intravenous  Once 09/14/15 1250 09/14/15 1506   09/12/15 1400  valACYclovir (VALTREX) tablet 500 mg  Status:  Discontinued  500 mg Oral Daily 09/12/15 1104 09/20/15 1256   09/12/15 1200  vancomycin (VANCOCIN) 500 mg in  sodium chloride 0.9 % 100 mL IVPB  Status:  Discontinued     500 mg 100 mL/hr over 60 Minutes Intravenous Every T-Th-Sa (Hemodialysis) 09/10/15 1959 09/12/15 0806   09/12/15 1200  vancomycin (VANCOCIN) IVPB 750 mg/150 ml premix  Status:  Discontinued     750 mg 150 mL/hr over 60 Minutes Intravenous Every T-Th-Sa (Hemodialysis) 09/12/15 0806 09/14/15 1506   09/12/15 1017  Vancomycin (VANCOCIN) 750-5 MG/150ML-% IVPB    Comments:  [name]   : cabinet override      09/12/15 1017 09/12/15 1032   09/11/15 0600  piperacillin-tazobactam (ZOSYN) IVPB 3.375 g  Status:  Discontinued     3.375 g 12.5 mL/hr over 240 Minutes Intravenous Every 12 hours 09/10/15 1959 09/15/15 1027   09/11/15 0530  vancomycin (VANCOCIN) 500 mg in sodium chloride 0.9 % 100 mL IVPB  Status:  Discontinued     500 mg 100 mL/hr over 60 Minutes Intravenous Every 12 hours 09/10/15 1656 09/10/15 1950   09/10/15 2330  piperacillin-tazobactam (ZOSYN) IVPB 3.375 g  Status:  Discontinued     3.375 g 12.5 mL/hr over 240 Minutes Intravenous Every 8 hours 09/10/15 1656 09/10/15 1950   09/10/15 1700  piperacillin-tazobactam (ZOSYN) IVPB 3.375 g  Status:  Discontinued     3.375 g 100 mL/hr over 30 Minutes Intravenous  Once 09/10/15 1656 09/10/15 1701   09/10/15 1700  vancomycin (VANCOCIN) IVPB 1000 mg/200 mL premix     1,000 mg 200 mL/hr over 60 Minutes Intravenous  Once 09/10/15 1656 09/10/15 1801   09/10/15 1645  piperacillin-tazobactam (ZOSYN) IVPB 3.375 g     3.375 g 100 mL/hr over 30 Minutes Intravenous  Once 09/10/15 1637 09/10/15 1721   09/10/15 1645  vancomycin (VANCOCIN) IVPB 1000 mg/200 mL premix  Status:  Discontinued     1,000 mg 200 mL/hr over 60 Minutes Intravenous  Once 09/10/15 1637 09/10/15 1701      Scheduled Meds: . sodium chloride   Intravenous Once  . sodium chloride flush  3 mL Intravenous Q12H   Continuous Infusions:  PRN Meds:.sodium chloride, acetaminophen **OR** acetaminophen, glycopyrrolate,  haloperidol lactate, HYDROmorphone (DILAUDID) injection, LORazepam, [DISCONTINUED] ondansetron **OR** ondansetron (ZOFRAN) IV, sodium chloride flush   PHYSICAL EXAM: Vital signs: Vitals:   09/21/15 0607 09/21/15 1438 09/21/15 2155 09/22/15 0659  BP: (!) 146/84 (!) 161/101 (!) 148/90 126/82  Pulse: (!) 117 (!) 127 (!) 122 (!) 112  Resp: 17 18 18 20   Temp: 98.6 F (37 C) (!) 101.5 F (38.6 C) (!) 100.9 F (38.3 C) 98.2 F (36.8 C)  TempSrc: Axillary Oral  Oral  SpO2: 94% 92% 95% 95%  Weight:      Height:       Filed Weights   09/19/15 0655 09/19/15 1109 09/20/15 1510  Weight: 54.6 kg (120 lb 5.9 oz) 54.7 kg (120 lb 9.5 oz) 52.6 kg (115 lb 15.4 oz)   Body mass index is 21.21 kg/m.   Gen Exam: Lethargic, frail, chronically ill-appearing. Very deconditioned and weak. Neck: Supple, No JVD.   Chest: B/L Clear.  CVS: S1 S2 Regular Abdomen: soft, BS +, non tender, non distended.  Extremities: no edema, lower extremities warm to touch. Neurologic: Non Focal.   Skin: No Rash or lesions   Wounds: N/A.   I have personally reviewed following labs and imaging studies  LABORATORY DATA: CBC:  Recent Labs Lab 09/17/15  9449 09/18/15 0731 09/19/15 0412 09/20/15 0539  WBC 21.1* 17.1* 22.3* 19.3*  HGB 11.2* 9.3* 8.7* 7.1*  HCT 35.5* 29.5* 28.5* 23.6*  MCV 95.7 94.9 97.6 98.7  PLT 183 171 198 675    Basic Metabolic Panel:  Recent Labs Lab 09/17/15 0539 09/18/15 0731 09/19/15 0412 09/20/15 0539  NA 137 138 140 139  K 3.9 5.0 4.4 3.5  CL 99* 99* 101 97*  CO2 26 26 26 29   GLUCOSE 110* 83 121* 92  BUN 27* 32* 46* 23*  CREATININE 6.03* 4.76* 6.40* 4.18*  CALCIUM 8.8* 10.1 11.2* 10.2  PHOS 4.7* 4.9* 4.8*  --     GFR: Estimated Creatinine Clearance: 17.5 mL/min (by C-G formula based on SCr of 4.18 mg/dL).  Liver Function Tests:  Recent Labs Lab 09/17/15 0539 09/18/15 0731 09/19/15 0412  ALBUMIN 1.4* 1.4* 1.4*   No results for input(s): LIPASE, AMYLASE in the  last 168 hours.  Recent Labs Lab 09/20/15 0759  AMMONIA 33    Coagulation Profile: No results for input(s): INR, PROTIME in the last 168 hours.  Cardiac Enzymes: No results for input(s): CKTOTAL, CKMB, CKMBINDEX, TROPONINI in the last 168 hours.  BNP (last 3 results) No results for input(s): PROBNP in the last 8760 hours.  HbA1C: No results for input(s): HGBA1C in the last 72 hours.  CBG:  Recent Labs Lab 09/17/15 0851  GLUCAP 99    Lipid Profile: No results for input(s): CHOL, HDL, LDLCALC, TRIG, CHOLHDL, LDLDIRECT in the last 72 hours.  Thyroid Function Tests: No results for input(s): TSH, T4TOTAL, FREET4, T3FREE, THYROIDAB in the last 72 hours.  Anemia Panel: No results for input(s): VITAMINB12, FOLATE, FERRITIN, TIBC, IRON, RETICCTPCT in the last 72 hours.  Urine analysis:    Component Value Date/Time   COLORURINE BROWN (A) 09/11/2015 2252   APPEARANCEUR TURBID (A) 09/11/2015 2252   LABSPEC 1.016 09/11/2015 2252   LABSPEC 1.030 01/22/2015 1018   PHURINE 7.5 09/11/2015 2252   GLUCOSEU 100 (A) 09/11/2015 2252   GLUCOSEU Negative 01/22/2015 1018   HGBUR LARGE (A) 09/11/2015 2252   BILIRUBINUR MODERATE (A) 09/11/2015 2252   BILIRUBINUR Negative 01/22/2015 1018   KETONESUR 15 (A) 09/11/2015 2252   PROTEINUR >300 (A) 09/11/2015 2252   UROBILINOGEN 0.2 01/22/2015 1018   NITRITE NEGATIVE 09/11/2015 2252   LEUKOCYTESUR MODERATE (A) 09/11/2015 2252   LEUKOCYTESUR Color Interference due to blood in urine 01/22/2015 1018    Sepsis Labs: Lactic Acid, Venous    Component Value Date/Time   LATICACIDVEN 1.4 09/12/2015 1254    MICROBIOLOGY: Recent Results (from the past 240 hour(s))  Cath Tip Culture     Status: None   Collection Time: 09/17/15  1:55 PM  Result Value Ref Range Status   Specimen Description CATH TIP  Final   Special Requests NONE  Final   Culture NO GROWTH 3 DAYS  Final   Report Status 09/21/2015 FINAL  Final    RADIOLOGY  STUDIES/RESULTS: Ct Abdomen Pelvis Wo Contrast  Result Date: 09/15/2015 CLINICAL DATA:  Fever of unknown origin, acute onset. Initial encounter. EXAM: CT ABDOMEN AND PELVIS WITHOUT CONTRAST TECHNIQUE: Multidetector CT imaging of the abdomen and pelvis was performed following the standard protocol without IV contrast. COMPARISON:  CT of the chest, abdomen and pelvis performed 06/30/2015 FINDINGS: Small bilateral pleural effusions are noted. Bibasilar opacities likely reflect atelectasis, though pneumonia could have a similar appearance. The liver and spleen are unremarkable in appearance. Trace fluid adjacent to the gallbladder is nonspecific.  The gallbladder is within normal limits. The pancreas and adrenal glands are unremarkable. A 1.5 cm left renal cyst is noted. There is no evidence of hydronephrosis. No renal or ureteral stones are seen. No perinephric stranding is appreciated. No free fluid is identified. The small bowel is unremarkable in appearance. The stomach is within normal limits. No acute vascular abnormalities are seen. The appendix is normal in caliber and contains air, without evidence of appendicitis. The colon is grossly unremarkable in appearance. The bladder is decompressed and not well assessed. The prostate remains normal in size. No inguinal lymphadenopathy is seen. Presacral stranding is noted. No acute osseous abnormalities are identified. Previously noted changes of multiple myeloma are again seen. IMPRESSION: 1. Small bilateral pleural effusions noted. Bibasilar airspace opacities likely reflect atelectasis, though pneumonia could have a similar appearance. 2. Trace fluid adjacent to the gallbladder is nonspecific. The gallbladder is grossly unremarkable in appearance. 3. Small left renal cyst noted. 4. Presacral stranding noted. 5. Mild changes of multiple myeloma again noted within the visualized osseous structures. Electronically Signed   By: Garald Balding M.D.   On: 09/15/2015  03:12   Dg Chest 2 View  Result Date: 09/10/2015 CLINICAL DATA:  Fever and weakness. EXAM: CHEST  2 VIEW COMPARISON:  Multiple chest x-rays since July 06, 2015 FINDINGS: No pneumothorax. Bilateral pleural effusions and underlying opacities are identified. A rounded density in the lateral left lower lung measures up to 3.2 cm and is masslike in appearance. However, this was not present on multiple recent chest x-rays. No other nodules or masses. No overt edema. A right dialysis catheter is again identified. The cardiomediastinal silhouette is stable. IMPRESSION: 1. Rounded masslike low-attenuation density in the lateral left lung not seen on multiple recent chest x-rays. This may represent some loculated fluid. Recommend short-term follow-up to ensure resolution. 2. Bilateral pleural effusions with underlying atelectasis. 3. No other acute abnormalities. Electronically Signed   By: Dorise Bullion III M.D   On: 09/10/2015 17:28   Ct Head Wo Contrast  Result Date: 09/19/2015 CLINICAL DATA:  Altered mental status EXAM: CT HEAD WITHOUT CONTRAST TECHNIQUE: Contiguous axial images were obtained from the base of the skull through the vertex without intravenous contrast. COMPARISON:  07/06/2015 FINDINGS: Brain: No intracranial hemorrhage, mass effect or midline shift. No acute cortical infarction. Ventricular size is stable from prior exam. No intraparenchymal brain mass is noted. Vascular: No hyperdense vessel or unexpected calcification. Skull: Again noted lytic skull lesion with soft tissue component in left parietal region consistent with multiple myeloma. There is no significant change from prior exam. Additional smaller lesions are noted in left and right parietal region. Sinuses/Orbits: Paranasal sinuses and mastoid air cells are unremarkable. Other: None IMPRESSION: No acute intracranial abnormality. No acute cortical infarction. Again noted lytic calvarial lesions consistent with multiple myeloma.  Electronically Signed   By: Lahoma Crocker M.D.   On: 09/19/2015 19:26   Ct Chest Wo Contrast  Result Date: 09/12/2015 CLINICAL DATA:  Evaluate for mass within the chest. EXAM: CT CHEST WITHOUT CONTRAST TECHNIQUE: Multidetector CT imaging of the chest was performed following the standard protocol without IV contrast. COMPARISON:  Chest radiograph 09/10/2015; chest CT 06/30/2015 FINDINGS: Cardiovascular: Heart is enlarged. Aorta and main pulmonary artery normal in caliber. Central venous catheter tip terminates at the superior cavoatrial junction. Mediastinum/Nodes: No axillary, mediastinal or hilar lymphadenopathy. Mild heterogeneity of the thyroid, unchanged. Esophagus is unremarkable. Lungs/Pleura: Central airways are patent. Subpleural ground-glass and consolidative opacities within the right and  left lower lobes. Moderate layering bilateral pleural effusions, increased from prior. No pneumothorax. Motion artifact limits evaluation. There are multiple pleural-based masses demonstrated within both the left and right hemi thorax. A few of these have increased in size measuring up to 2.4 x 1.2 cm within the anterior left hemi thorax (image 66; series 2) and up to 1.3 x 2.7 cm in the lateral left hemi thorax (image 74; series 2). These are associated with underlying osseous destruction of the adjacent ribs including the anterior left second rib, posterior left third rib, the lateral left fifth rib and posterior seventh and ninth ribs. Upper Abdomen: Unremarkable Musculoskeletal: Re- demonstrated lytic lesions within the sternum. Additionally there multiple osseous destructive lesions involving predominantly the left ribs as described above associated with soft tissue masses, most compatible patient's history of multiple myeloma. There are few patchy lucencies throughout the thoracic spine, most notable within the left T4 transverse process. IMPRESSION: Interval increase in size of multiple predominately left-sided  pleural based soft tissue masses with associated underlying osseous destruction, most compatible patient's history of multiple myeloma. Persistent lytic lesion within the sternum/manubrium. Moderate layering bilateral pleural effusions, increased from prior, with underlying pulmonary consolidation most compatible with associated atelectasis. Electronically Signed   By: Lovey Newcomer M.D.   On: 09/12/2015 07:56   Ir Removal Tun Cv Cath W/o Fl  Result Date: 09/18/2015 INDICATION: End-stage renal disease. Fever. Patient also has functional permanent hemodialysis access. Request removal of tunneled hemodialysis catheter EXAM: REMOVAL TUNNELED CENTRAL HEMODIALYSIS CATHETER MEDICATIONS: None; ANESTHESIA/SEDATION: Moderate Sedation Time: None The patient's level of consciousness and vital signs were monitored continuously by radiology nursing throughout the procedure under my direct supervision. FLUOROSCOPY TIME:  Fluoroscopy Time: None COMPLICATIONS: None immediate. PROCEDURE: Informed written consent was obtained from the patient after a thorough discussion of the procedural risks, benefits and alternatives. All questions were addressed. Maximal Sterile Barrier Technique was utilized including caps, mask, sterile gowns, sterile gloves, sterile drape, hand hygiene and skin antiseptic. A timeout was performed prior to the initiation of the procedure. The patient's right chest and catheter was prepped and draped in a normal sterile fashion. Heparin was removed from both ports of catheter. 1% lidocaine was used for local anesthesia. Using gentle blunt dissection the cuff of the catheter was exposed and the catheter was removed in it's entirety. Pressure was held till hemostasis was obtained. A sterile dressing was applied. The patient tolerated the procedure well with no immediate complications. IMPRESSION: Successful catheter removal as described above. Read by: Ascencion Dike PA-C Electronically Signed   By: Corrie Mckusick  D.O.   On: 09/18/2015 14:41     LOS: 12 days   Esra Frankowski, MD  [facility] MCEYE:[phone]  If 7PM-7AM, please contact night-coverage [email or web address] Password [email or web address] 09/22/2015, 2:13 PM

## 2015-09-22 NOTE — Telephone Encounter (Signed)
ALL LABS, TX, F/U CANCELLED AS PER SCHEDULE MSG. 09/22/15

## 2015-09-22 NOTE — Progress Notes (Signed)
Courtesy note:  Charles [name] is sleepy in bed, arouses to voice, denies pain. Appears comfortable. Multiple family in room. No further HD planned-- comfort care only.  Patient is a Psychologist, forensic. Will request chaplain consult.  Appreciate help of palliative care as well as hospitalists to this patient and his family.

## 2015-09-22 NOTE — Progress Notes (Signed)
PMT RN Note: Visited pt. Wife at bedside. She describes an episode of pt being anxious last night around 2200, moving his hands around, "like it is when you can't sleep." Discussed scheduling medication at that time to prevent that happening tonight, but she prefers to keep orders prn and wants to give him as little medication as necessary. She states that pt has been able to wake up a few times a day to drink sips and does talk to the family at that time, but only a few sentences.   Plan to discuss patient with team today and have a f/u visit this afternoon. Discussed with Dr Waldron Labs.  Marjie Skiff Jamesmichael Shadd, RN, BSN, [facility] 09/22/2015 10:28 AM Cell [phone] 8:00-4:00 Monday-Friday Office [phone]

## 2015-09-22 NOTE — Clinical Social Work Note (Addendum)
CSW called and spoke with Darin Engels at the [facility] in [place]. He stated that name of the process that the patient's parents and sister are in need of is called Theatre stage manager. In order for them to be able to come to [place] for visitation, Epifanio Lesches will need a letter from the hospital regarding diagnostic and prognostic information, the patient's full name, the full name of the family members who want to come to the [place], the patient's, family members, and sponsor's (legal resident or [place] citizen) ID and birth certificate. Mr. Roanna Raider stated that there is an individual who is in charge of this and will call CSW back with further information.  Dayton Scrape, [place] [phone]  2:47 pm CSW received call from Peterson Ao [phone]) regarding further information on what was previously given. Patient needs a letter signed by MD with one phone number (to confirm claims) stating diagnosis, prognosis, and why the family needs to come to the [place]. Ms. Carolyn Stare fax number is [phone]. She will contact the patient's wife and give her information on what she needs to get the patient's family to the [place].  Dayton Scrape, [place]

## 2015-09-22 NOTE — Progress Notes (Signed)
Patient given Tylenol 650 mg suppository for Temp of 100.8.

## 2015-09-23 ENCOUNTER — Ambulatory Visit: Payer: Self-pay

## 2015-09-23 ENCOUNTER — Encounter: Payer: Self-pay | Admitting: Oncology

## 2015-09-23 ENCOUNTER — Other Ambulatory Visit: Payer: Self-pay

## 2015-09-23 MED ORDER — GLYCOPYRROLATE 0.2 MG/ML IJ SOLN: 0.4000 mg | INTRAMUSCULAR | Status: AC | PRN

## 2015-09-23 MED ORDER — ONDANSETRON HCL 4 MG/2ML IJ SOLN: 4.0000 mg | Freq: Four times a day (QID) | INTRAMUSCULAR | 0 refills | Status: AC | PRN

## 2015-09-23 MED ORDER — ACETAMINOPHEN 650 MG RE SUPP: 650.0000 mg | Freq: Four times a day (QID) | RECTAL | 0 refills | Status: AC | PRN

## 2015-09-23 MED ORDER — ACETAMINOPHEN 325 MG PO TABS: 650.0000 mg | ORAL_TABLET | Freq: Four times a day (QID) | ORAL | Status: AC | PRN

## 2015-09-23 MED ORDER — LORAZEPAM 2 MG/ML IJ SOLN: 0.5000 mg | INTRAMUSCULAR | 0 refills | Status: AC | PRN

## 2015-09-23 MED ORDER — HYDROMORPHONE HCL 1 MG/ML IJ SOLN: 0.5000 mg | INTRAMUSCULAR | 0 refills | Status: AC | PRN

## 2015-09-23 NOTE — Clinical Social Work Note (Signed)
[facility] can admit patient today, per Becky Augusta. They are getting paperwork today due to patient not having insurance. Patient's wife agreeable. She has not yet gotten the documents needed for the consulate but says she will get them as soon as she can.  Dayton Scrape, [place]

## 2015-09-23 NOTE — Discharge Instructions (Signed)
Management by [facility]. Feeding for comfort.

## 2015-09-23 NOTE — Progress Notes (Signed)
PMT RN Note: Saw pt, who barely opened eyes to shaking his arm and calling his name. Spoke with wife with Dr Waldron Labs. He gave prognosis of days at most. Discussed option of hospice at [facility] (family requested hospice closest to home), which has [facility] staff to support family/wife. Wife would like to talk to rep from [facility].  Notified Collie Siad w/[facility] and SW Judson Roch B of referral to [facility]. Pt has no insurance; case will need to be reviewed by hospice medical director.  Marjie Skiff Contrell Ballentine, RN, BSN, [facility] 09/23/2015 10:56 AM Cell [phone] 8:00-4:00 Monday-Friday Office [phone]

## 2015-09-23 NOTE — Progress Notes (Signed)
COURTESY NOTE:  Taylen responds to voice, appears comfortable, denies pain.   Wife and children in room.  I told wife I would be glad to do the letter she requests-- we will need names and DOB of the relatives.  Suggest [facility] referral for the children.

## 2015-09-23 NOTE — Progress Notes (Signed)
[place] [facility] RN note for [employer]   Received request from [place] of [facility] and Dayton Scrape, CSW of family interest in [facility] with request to transfer today.   Chart reviewed;   Patient is eligible for [facility] and a bed is available for tranfer today.  Met with the patient's wife and daughter to confirm interest and explain services.   Family is agreeable to transfer today.  CSW is aware.     [facility] representative will be meeting with family soon to complete registration paperwork and will notify CSW when paperwork has been completed ; and will notify CSW to arrange for transport.     Please fax discharge summary to [phone].   RN please call report to [phone].     Mickie Kay, [facility] Liaison [phone]

## 2015-09-23 NOTE — Clinical Social Work Note (Signed)
CSW facilitated patient discharge including contacting patient family and facility to confirm patient discharge plans. Clinical information faxed to facility and family agreeable with plan. CSW arranged ambulance transport via [facility] to [facility]: [facility] Rhonda W around 4:30 pm. RN to call report prior to discharge [phone]).  CSW will sign off for now as social work intervention is no longer needed. Please consult [place] again if new needs arise.  Patient's family unable to pay for transport and hospital does not pay unless destination is 50+ miles away. [facility] will still transport.  Dayton Scrape, [place]

## 2015-09-23 NOTE — Progress Notes (Signed)
[place] [facility] RN note for [employer]  Met with family to complete paperwork for admission.  Dayton Scrape, CSW notified and to arrange transport.  Moss Mc, RN Director of Inpatient Services [phone]

## 2015-09-23 NOTE — Discharge Summary (Signed)
Charles [name], is a 40 y.o. male  DOB [birth year]  MRN [id number].  Admission date:  09/10/2015  Admitting Physician  Roney Jaffe, MD  Discharge Date:  09/23/2015   Primary MD  [name], MD  Recommendations for primary care physician for things to follow:  - management per [facility] place   Admission Diagnosis  ESRD (end stage renal disease) ([place]) [N18.6] Fever [R50.9] Sepsis, due to unspecified organism [facility]) [A41.9]   Discharge Diagnosis  ESRD (end stage renal disease) ([place]) [N18.6] Fever [R50.9] Sepsis, due to unspecified organism [facility]) [A41.9]    Principal Problem:   Sepsis ([place]) Active Problems:   ESRD (end stage renal disease) ([place])   Hypoalbuminemia due to protein-calorie malnutrition ([place])   Multiple myeloma not having achieved remission ([place])   Fever   Anemia   Essential hypertension   Infection of hemodialysis catheter ([place])   Malnutrition of moderate degree   Palliative care encounter      Past Medical History:  Diagnosis Date  . ESRD (end stage renal disease) on dialysis ([place])    [address] T TH S (08/06/2015)  . GERD (gastroesophageal reflux disease)   . Headache    "monthly" (08/06/2015)  . History of blood transfusion 07/2015   "related to his kidneys"  . Hypertension   . IgG myeloma ([place])   . Kidney stones   . Lytic bone lesions on xray   . Neuromuscular disorder ([place])    states both arms are shaking a lot    Past Surgical History:  Procedure Laterality Date  . AV FISTULA PLACEMENT Left 07/13/2015   Procedure: CREATION OF LEFT UPPER ARM  ARTERIOVENOUS (AV) FISTULA ;  Surgeon: Elam Dutch, MD;  Location: [place];  Service: Vascular;  Laterality: Left;  . INSERTION OF DIALYSIS CATHETER Right ~ 07/2015   chest  . IR GENERIC HISTORICAL  09/17/2015   IR REMOVAL TUN CV CATH W/O FL 09/17/2015 Ascencion Dike, PA-C [facility]-INTERV RAD       History of  present illness and  Hospital Course:     Kindly see H&P for history of present illness and admission details, please review complete Labs, Consult reports and Test reports for all details in brief  HPI  from the history and physical done on the day of admission 09/10/2015  HPI: Charles [name] is a 40 y.o. male with hx of mult myeloma and ESRD on HD TTS who presented to cancer center for chemo today and was sent to ED for fever and tachycardia.  Wife says this started today, yesterday he was "fine".  Has been on about 18 mos of chemoRx for multiple myeloma , not all the same agent.  He started dialysis in May of this year and has a maturing AVF L upper arm.  Reports mild cough for several days, nonproductive.  He got chemo yest (gets chemoRx every Wed and Thurs for 3 wks then has one week off, he is close to finishing his 18 mos of chemo).  His chemoRx is carfilzomib.    Has c/o bilat foot pain and hand pains for a few weeks.  No abd pain.  Feels bad off and on, good and bad days.  After chemo he usually has n/v and doesn't eat well.    Born in [place], moved to states around age 74.  Him and his wife have 5 kids, ages 73, 73, 52, 81 and 2.  No etoh / drugs/ smoking.  They have a house, he works as a Theme park manager.    Admissions: Jan 2016 - admit and dx'd with mult myeloma [facility], lytic bone lesions and CKD) Dec 2016 - acute / chron renal failure w creat 5.3 on admit and 3.0 on day of dc, avoid nsaids'/ contrast May 2017 - nausea/ vomiting, a/c renal w PRES syndrome and malig HTN, PNA and vision disturbance, fevers were tumor fevers.  Started on HD, L BC AVF placed on 07/13/15. Cx's neg. July 2017 - fevers, not sure tumor fever vs PNA vs UTI; got 5 days IV abx and dc'd on po augmentin   Hospital Course   [name] with history of multiple myeloma on outpatient chemotherapy, ESRD on hemodialysis (TTS) admitted for evaluation of fever of unknown origin. Blood cultures negative  so far. CT of the chest, along with abdomen and pelvis negative for any infection. Thought to have  tumor fever at this time-has no foci of infection currently apparent-has been started on naproxen on 8/17 with no further fever. Unfortunately, he has now become very weak and deconditioned due to his acute illness. Unfortunately he continues to have significant clinical deterioration, increased lethargy ,palliative medicine consulted, and patient was transitioned to comfort care.   Sepsis due to fever of unknown origin:    Blood cultures negative and no foci of infection evident on CT chest and abdomen. Previously was empirically on vancomycin and Zosyn, no longer on antibiotics-felt to have tumor fever   FUO: Blood cultures negative,  no infective foci seen on CT of the abdomen and chest. HIV and acute hepatitis panel negative. CMV/EBV serology negative. Hemodialysis catheter was removed on 8/17. After extensive workup with no findings, started on naproxen for presumed tumor fever on 8/17-he no longer is febrile.. No further recommendations from infectious disease  Mild sinus tachycardia:   ESRD:  - Currently comfort care, no further hemodialysis  Anemia due to ESRD.   History of multiple myeloma with pleural-based lung metastases:  -Followed by Dr. Jana Hakim from oncology  Hypertension:   Chronic pain syndrome  Malnutrition of moderate degree: Feeding for comfort  Deconditioning: Currently comfort care  Goals of care: - Palliative medicine consulted, they met with wife and family, patient is currently comfort care, no further hemodialysis, be discharged today to [facility]  place  Discharge Condition:   few days.   Follow UP  Follow-up Information     [facility] .   Why:  please call  to make apt for hospital follow up for a week after discharge Contact information: [address] [place] [address] [place] [address] [phone]              Discharge Instructions  and  Discharge Medications    Discharge Instructions    Discharge instructions    Complete by:  As directed   Management by [facility] place. Feeding for comfort.       Medication List    STOP taking these medications   acyclovir 200 MG capsule  Commonly known as:  ZOVIRAX   calcium acetate 667 MG capsule Commonly known as:  PHOSLO   dexamethasone 4 MG tablet Commonly known as:  DECADRON   feeding supplement (NEPRO CARB STEADY) Liqd   feeding supplement (PRO-STAT SUGAR FREE 64) Liqd   ferrous sulfate 325 (65 FE) MG tablet   hydrALAZINE 25 MG tablet Commonly known as:  APRESOLINE   LORazepam 0.5 MG tablet Commonly known as:  ATIVAN Replaced by:  LORazepam 2 MG/ML injection   metoprolol 50 MG tablet Commonly known as:  LOPRESSOR   mirtazapine 15 MG tablet Commonly known as:  REMERON   morphine 15 MG 12 hr tablet Commonly known as:  MS CONTIN   multivitamin Tabs tablet   prochlorperazine 10 MG tablet Commonly known as:  COMPAZINE   senna-docusate 8.6-50 MG tablet Commonly known as:  Senokot-S   traMADol 50 MG tablet Commonly known as:  ULTRAM     TAKE these medications   acetaminophen 650 MG suppository Commonly known as:  TYLENOL Place 1 suppository (650 mg total) rectally every 6 (six) hours as needed for mild pain (or Fever >/= 101).   acetaminophen 325 MG tablet Commonly known as:  TYLENOL Take 2 tablets (650 mg total) by mouth every 6 (six) hours as needed for mild pain (or Fever >/= 101).   glycopyrrolate 0.2 MG/ML injection Commonly known as:  ROBINUL Inject 2 mLs (0.4 mg total) into the vein every 4 (four) hours as needed (secretions).   HYDROmorphone 1 MG/ML injection Commonly known as:  DILAUDID Inject 0.5-1 mLs (0.5-1 mg total) into the vein every 2 (two) hours as needed for moderate pain or severe pain (dyspnea).   LORazepam 2 MG/ML injection Commonly known as:  ATIVAN Inject 0.25-0.5 mLs (0.5-1 mg  total) into the vein every 2 (two) hours as needed for anxiety, seizure or sedation. Replaces:  LORazepam 0.5 MG tablet   ondansetron 4 MG/2ML Soln injection Commonly known as:  ZOFRAN Inject 2 mLs (4 mg total) into the vein every 6 (six) hours as needed for nausea.         Diet and Activity recommendation: See Discharge Instructions above   Consults obtained -   ID  nephrology   Oncology Palliative medicine  Major procedures and Radiology Reports - PLEASE review detailed and final reports for all details, in brief -      Ct Abdomen Pelvis Wo Contrast  Result Date: 09/15/2015 CLINICAL DATA:  Fever of unknown origin, acute onset. Initial encounter. EXAM: CT ABDOMEN AND PELVIS WITHOUT CONTRAST TECHNIQUE: Multidetector CT imaging of the abdomen and pelvis was performed following the standard protocol without IV contrast. COMPARISON:  CT of the chest, abdomen and pelvis performed 06/30/2015 FINDINGS: Small bilateral pleural effusions are noted. Bibasilar opacities likely reflect atelectasis, though pneumonia could have a similar appearance. The liver and spleen are unremarkable in appearance. Trace fluid adjacent to the gallbladder is nonspecific. The gallbladder is within normal limits. The pancreas and adrenal glands are unremarkable. A 1.5 cm left renal cyst is noted. There is no evidence of hydronephrosis. No renal or ureteral stones are seen. No perinephric stranding is appreciated. No free fluid is identified. The small bowel is unremarkable in appearance. The stomach is within normal limits. No acute vascular abnormalities are seen. The appendix is normal in caliber and contains air, without evidence of appendicitis. The colon is grossly unremarkable in appearance. The bladder is decompressed and not well assessed. The prostate remains normal in size. No inguinal lymphadenopathy  is seen. Presacral stranding is noted. No acute osseous abnormalities are identified. Previously noted  changes of multiple myeloma are again seen. IMPRESSION: 1. Small bilateral pleural effusions noted. Bibasilar airspace opacities likely reflect atelectasis, though pneumonia could have a similar appearance. 2. Trace fluid adjacent to the gallbladder is nonspecific. The gallbladder is grossly unremarkable in appearance. 3. Small left renal cyst noted. 4. Presacral stranding noted. 5. Mild changes of multiple myeloma again noted within the visualized osseous structures. Electronically Signed   By: Garald Balding M.D.   On: 09/15/2015 03:12   Dg Chest 2 View  Result Date: 09/10/2015 CLINICAL DATA:  Fever and weakness. EXAM: CHEST  2 VIEW COMPARISON:  Multiple chest x-rays since July 06, 2015 FINDINGS: No pneumothorax. Bilateral pleural effusions and underlying opacities are identified. A rounded density in the lateral left lower lung measures up to 3.2 cm and is masslike in appearance. However, this was not present on multiple recent chest x-rays. No other nodules or masses. No overt edema. A right dialysis catheter is again identified. The cardiomediastinal silhouette is stable. IMPRESSION: 1. Rounded masslike low-attenuation density in the lateral left lung not seen on multiple recent chest x-rays. This may represent some loculated fluid. Recommend short-term follow-up to ensure resolution. 2. Bilateral pleural effusions with underlying atelectasis. 3. No other acute abnormalities. Electronically Signed   By: Dorise Bullion III M.D   On: 09/10/2015 17:28   Ct Head Wo Contrast  Result Date: 09/19/2015 CLINICAL DATA:  Altered mental status EXAM: CT HEAD WITHOUT CONTRAST TECHNIQUE: Contiguous axial images were obtained from the base of the skull through the vertex without intravenous contrast. COMPARISON:  07/06/2015 FINDINGS: Brain: No intracranial hemorrhage, mass effect or midline shift. No acute cortical infarction. Ventricular size is stable from prior exam. No intraparenchymal brain mass is noted.  Vascular: No hyperdense vessel or unexpected calcification. Skull: Again noted lytic skull lesion with soft tissue component in left parietal region consistent with multiple myeloma. There is no significant change from prior exam. Additional smaller lesions are noted in left and right parietal region. Sinuses/Orbits: Paranasal sinuses and mastoid air cells are unremarkable. Other: None IMPRESSION: No acute intracranial abnormality. No acute cortical infarction. Again noted lytic calvarial lesions consistent with multiple myeloma. Electronically Signed   By: Lahoma Crocker M.D.   On: 09/19/2015 19:26   Ct Chest Wo Contrast  Result Date: 09/12/2015 CLINICAL DATA:  Evaluate for mass within the chest. EXAM: CT CHEST WITHOUT CONTRAST TECHNIQUE: Multidetector CT imaging of the chest was performed following the standard protocol without IV contrast. COMPARISON:  Chest radiograph 09/10/2015; chest CT 06/30/2015 FINDINGS: Cardiovascular: Heart is enlarged. Aorta and main pulmonary artery normal in caliber. Central venous catheter tip terminates at the superior cavoatrial junction. Mediastinum/Nodes: No axillary, mediastinal or hilar lymphadenopathy. Mild heterogeneity of the thyroid, unchanged. Esophagus is unremarkable. Lungs/Pleura: Central airways are patent. Subpleural ground-glass and consolidative opacities within the right and left lower lobes. Moderate layering bilateral pleural effusions, increased from prior. No pneumothorax. Motion artifact limits evaluation. There are multiple pleural-based masses demonstrated within both the left and right hemi thorax. A few of these have increased in size measuring up to 2.4 x 1.2 cm within the anterior left hemi thorax (image 66; series 2) and up to 1.3 x 2.7 cm in the lateral left hemi thorax (image 74; series 2). These are associated with underlying osseous destruction of the adjacent ribs including the anterior left second rib, posterior left third rib, the lateral left  fifth rib  and posterior seventh and ninth ribs. Upper Abdomen: Unremarkable Musculoskeletal: Re- demonstrated lytic lesions within the sternum. Additionally there multiple osseous destructive lesions involving predominantly the left ribs as described above associated with soft tissue masses, most compatible patient's history of multiple myeloma. There are few patchy lucencies throughout the thoracic spine, most notable within the left T4 transverse process. IMPRESSION: Interval increase in size of multiple predominately left-sided pleural based soft tissue masses with associated underlying osseous destruction, most compatible patient's history of multiple myeloma. Persistent lytic lesion within the sternum/manubrium. Moderate layering bilateral pleural effusions, increased from prior, with underlying pulmonary consolidation most compatible with associated atelectasis. Electronically Signed   By: Lovey Newcomer M.D.   On: 09/12/2015 07:56   Ir Removal Tun Cv Cath W/o Fl  Result Date: 09/18/2015 INDICATION: End-stage renal disease. Fever. Patient also has functional permanent hemodialysis access. Request removal of tunneled hemodialysis catheter EXAM: REMOVAL TUNNELED CENTRAL HEMODIALYSIS CATHETER MEDICATIONS: None; ANESTHESIA/SEDATION: Moderate Sedation Time: None The patient's level of consciousness and vital signs were monitored continuously by radiology nursing throughout the procedure under my direct supervision. FLUOROSCOPY TIME:  Fluoroscopy Time: None COMPLICATIONS: None immediate. PROCEDURE: Informed written consent was obtained from the patient after a thorough discussion of the procedural risks, benefits and alternatives. All questions were addressed. Maximal Sterile Barrier Technique was utilized including caps, mask, sterile gowns, sterile gloves, sterile drape, hand hygiene and skin antiseptic. A timeout was performed prior to the initiation of the procedure. The patient's right chest and catheter was  prepped and draped in a normal sterile fashion. Heparin was removed from both ports of catheter. 1% lidocaine was used for local anesthesia. Using gentle blunt dissection the cuff of the catheter was exposed and the catheter was removed in it's entirety. Pressure was held till hemostasis was obtained. A sterile dressing was applied. The patient tolerated the procedure well with no immediate complications. IMPRESSION: Successful catheter removal as described above. Read by: Ascencion Dike PA-C Electronically Signed   By: Corrie Mckusick D.O.   On: 09/18/2015 14:41    Micro Results    Recent Results (from the past 240 hour(s))  Cath Tip Culture     Status: None   Collection Time: 09/17/15  1:55 PM  Result Value Ref Range Status   Specimen Description CATH TIP  Final   Special Requests NONE  Final   Culture NO GROWTH 3 DAYS  Final   Report Status 09/21/2015 FINAL  Final       Today   Subjective:   Charles [name] today is Minimally responsive  Objective:   Blood pressure 133/87, pulse (!) 122, temperature (!) 100.4 F (38 C), temperature source Oral, resp. rate 18, height 5' 2"  (1.575 m), weight 52.6 kg (115 lb 15.4 oz), SpO2 96 %.   Intake/Output Summary (Last 24 hours) at 09/23/15 1349 Last data filed at 09/22/15 1900  Gross per 24 hour  Intake                0 ml  Output                0 ml  Net                0 ml    Exam Gen Exam: Lethargic, frail, chronically ill-appearing. Very deconditioned and weak. Neck: Supple, No JVD.   Chest: B/L Clear., diminished CVS: S1 S2 Regular Abdomen: soft, BS +,  Extremities: no edema, lower extremities warm to touch. Skin: No Rash or  lesions     Data Review   CBC w Diff: Lab Results  Component Value Date   WBC 19.3 (H) 09/20/2015   HGB 7.1 (L) 09/20/2015   HGB 7.6 (L) 09/10/2015   HCT 23.6 (L) 09/20/2015   HCT 24.4 (L) 09/10/2015   PLT 177 09/20/2015   PLT 357 09/10/2015   LYMPHOPCT 21 09/10/2015   LYMPHOPCT 17.1  09/10/2015   MONOPCT 11 09/10/2015   MONOPCT 13.9 09/10/2015   EOSPCT 1 09/10/2015   EOSPCT 0.2 09/10/2015   BASOPCT 0 09/10/2015   BASOPCT 0.2 09/10/2015    CMP: Lab Results  Component Value Date   NA 139 09/20/2015   NA 133 (L) 09/10/2015   K 3.5 09/20/2015   K 3.7 09/10/2015   CL 97 (L) 09/20/2015   CO2 29 09/20/2015   CO2 29 09/10/2015   BUN 23 (H) 09/20/2015   BUN 11.4 09/10/2015   CREATININE 4.18 (H) 09/20/2015   CREATININE 1.9 (H) 09/10/2015   PROT 6.3 (L) 09/10/2015   PROT 7.1 09/10/2015   ALBUMIN 1.4 (L) 09/19/2015   ALBUMIN 1.5 (L) 09/10/2015   BILITOT 0.3 09/10/2015   BILITOT 0.32 09/10/2015   ALKPHOS 322 (H) 09/10/2015   ALKPHOS 429 (H) 09/10/2015   AST 36 09/10/2015   AST 37 (H) 09/10/2015   ALT 23 09/10/2015   ALT 23 09/10/2015  .   Total Time in preparing paper work, data evaluation and todays exam - 30 minutes  Yunuen Mordan M.D on 09/23/2015 at 1:49 PM  [facility]   Office  [phone]

## 2015-09-24 ENCOUNTER — Encounter: Payer: Self-pay | Admitting: Oncology

## 2015-09-24 ENCOUNTER — Other Ambulatory Visit: Payer: Self-pay

## 2015-09-24 ENCOUNTER — Ambulatory Visit: Payer: Self-pay | Admitting: Oncology

## 2015-09-24 ENCOUNTER — Other Ambulatory Visit: Payer: Self-pay | Admitting: Oncology

## 2015-09-24 ENCOUNTER — Ambulatory Visit: Payer: Self-pay

## 2015-09-29 ENCOUNTER — Encounter: Payer: Self-pay | Admitting: Vascular Surgery

## 2015-09-30 ENCOUNTER — Other Ambulatory Visit: Payer: Self-pay

## 2015-10-02 ENCOUNTER — Encounter: Payer: Self-pay | Admitting: Oncology

## 2015-10-02 NOTE — Progress Notes (Signed)
Sent lisa hays-billing drug replacment=- to adj [id number]- [id number]

## 2015-10-02 DEATH — deceased

## 2015-10-05 NOTE — Progress Notes (Signed)
  Radiation Oncology         [phone] ________________________________  Name: Charles [name] MRN: [id number]  Date: 07/01/2015  DOB: 1975-05-21  SIMULATION AND TREATMENT PLANNING NOTE  DIAGNOSIS:     ICD-9-CM ICD-10-CM   1. Multiple myeloma not having achieved remission ([place]) 203.00 C90.00      Site:  T3-T11 Vertebral bodies and right upper lobe tumor concurrently which represented adjacent targets  NARRATIVE:  The patient was brought to the [facility].  Identity was confirmed.  All relevant records and images related to the planned course of therapy were reviewed.   Written consent to proceed with treatment was confirmed which was freely given after reviewing the details related to the planned course of therapy had been reviewed with the patient.  Then, the patient was set-up in a stable reproducible  supine position for radiation therapy.  CT images were obtained.  Surface markings were placed.    Medically necessary complex treatment device(s) for immobilization:  Customized vac lock bag.   The CT images were loaded into the planning software.  Then the target and avoidance structures were contoured.  Treatment planning then occurred.  The radiation prescription was entered and confirmed.  A total of 3 complex treatment devices were fabricated which relate to the designed radiation treatment fields. Each of these customized fields/ complex treatment devices will be used on a daily basis during the radiation course. I have requested : 3D Simulation  I have requested a DVH of the following structures: Target volume, lungs, spinal cord, heart.   The patient will undergo daily image guidance to ensure accurate localization of the target, and adequate minimize dose to the normal surrounding structures in close proximity to the target.   PLAN:  The patient will receive 25 Gy in 10 fractions.  ________________________________   Jodelle Gross, MD, PhD

## 2015-10-05 NOTE — Addendum Note (Signed)
Encounter addended by: Kyung Rudd, MD on: 10/05/2015  8:45 PM<BR>    Actions taken: Visit diagnoses modified, Sign clinical note

## 2015-10-08 ENCOUNTER — Encounter (HOSPITAL_COMMUNITY): Payer: Self-pay

## 2015-10-08 ENCOUNTER — Ambulatory Visit: Payer: Self-pay | Admitting: Vascular Surgery

## 2016-11-11 ENCOUNTER — Other Ambulatory Visit: Payer: Self-pay | Admitting: Nurse Practitioner

## 2017-06-15 IMAGING — CR DG CHEST 2V
2 series · 2 of 2 positions shown · non-contrast
Comparison: CT chest 01/30/2014.

CLINICAL DATA: Fever, night sweats, and chills.  Evaluate for TB.

EXAM:
CHEST  2 VIEW

[w chest pa]
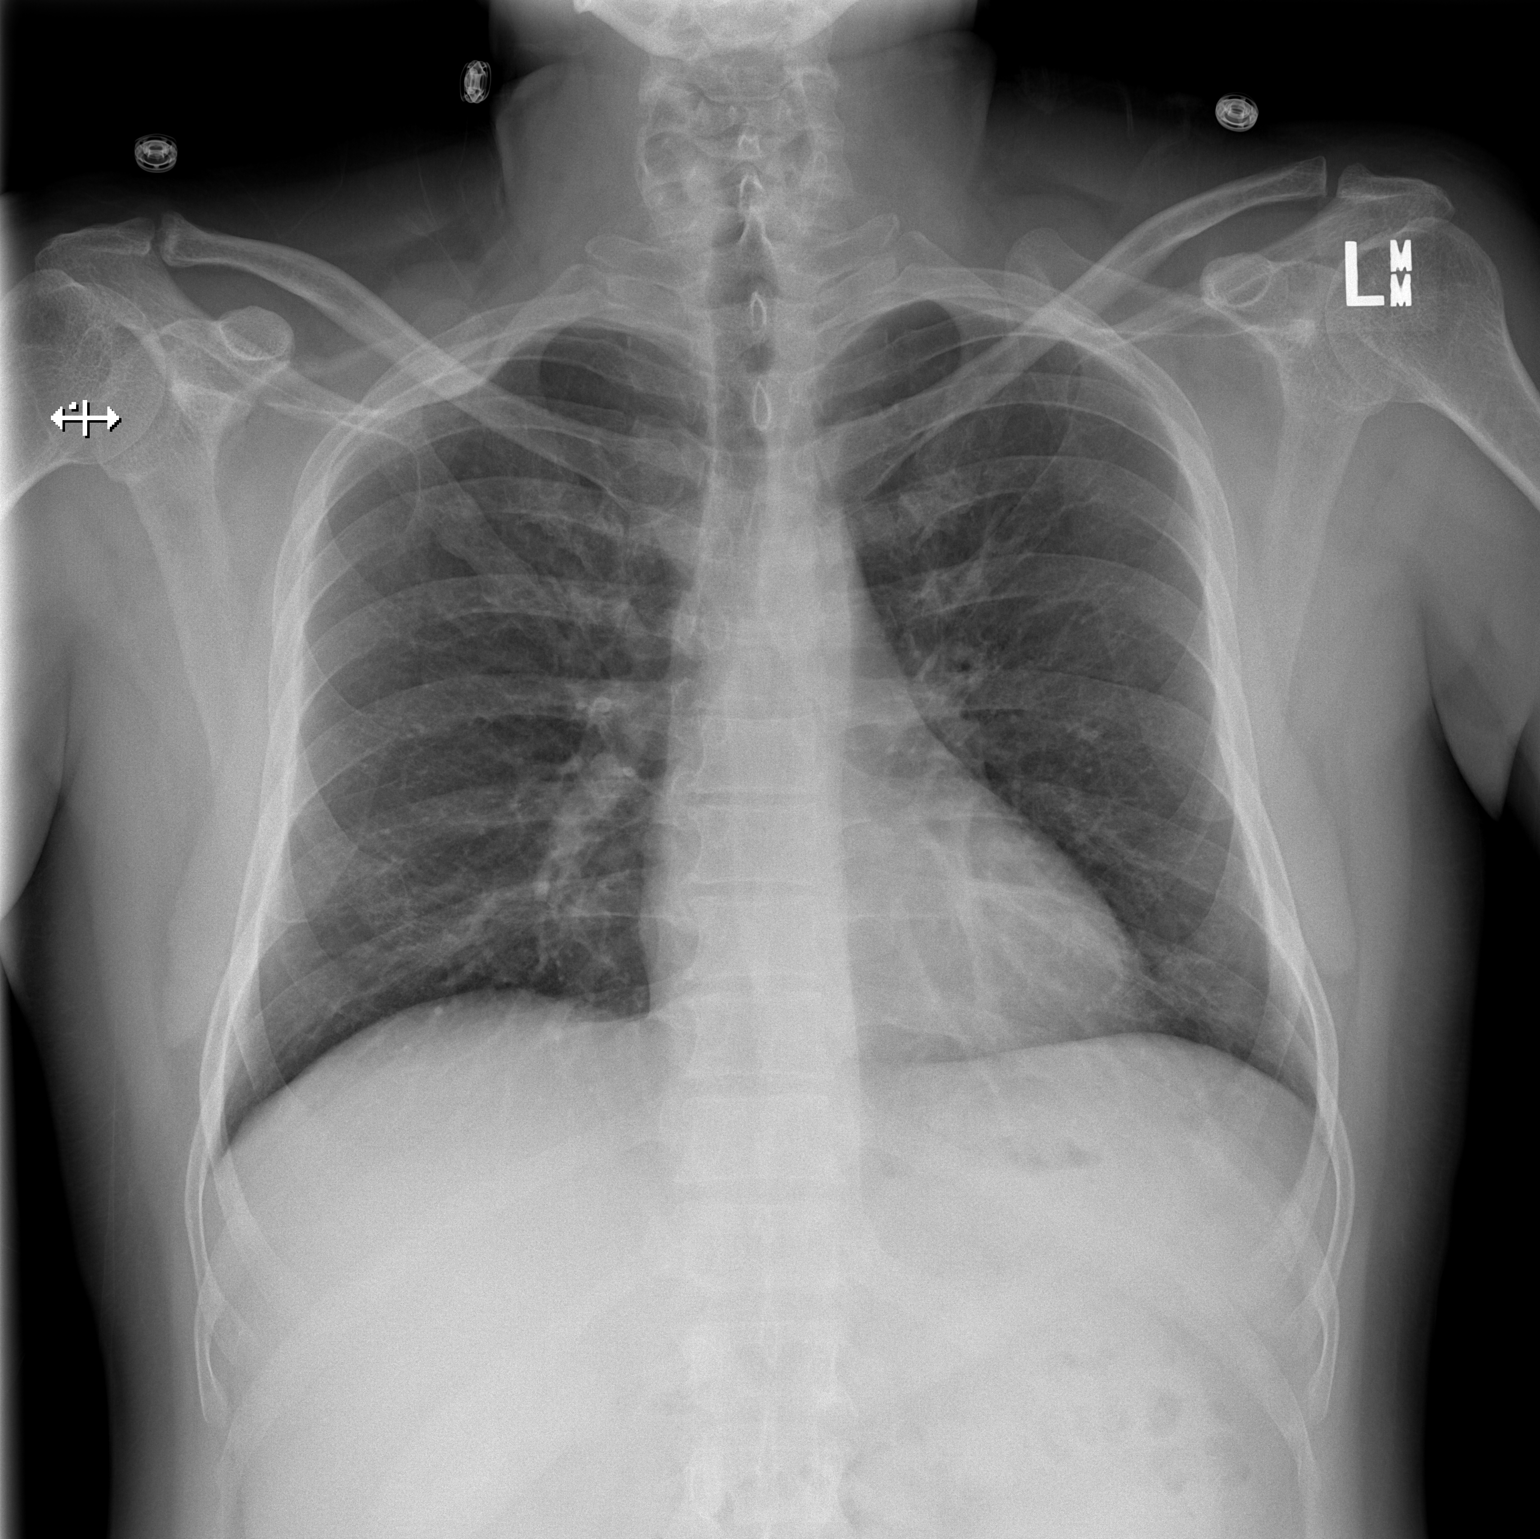

[w chest lat]
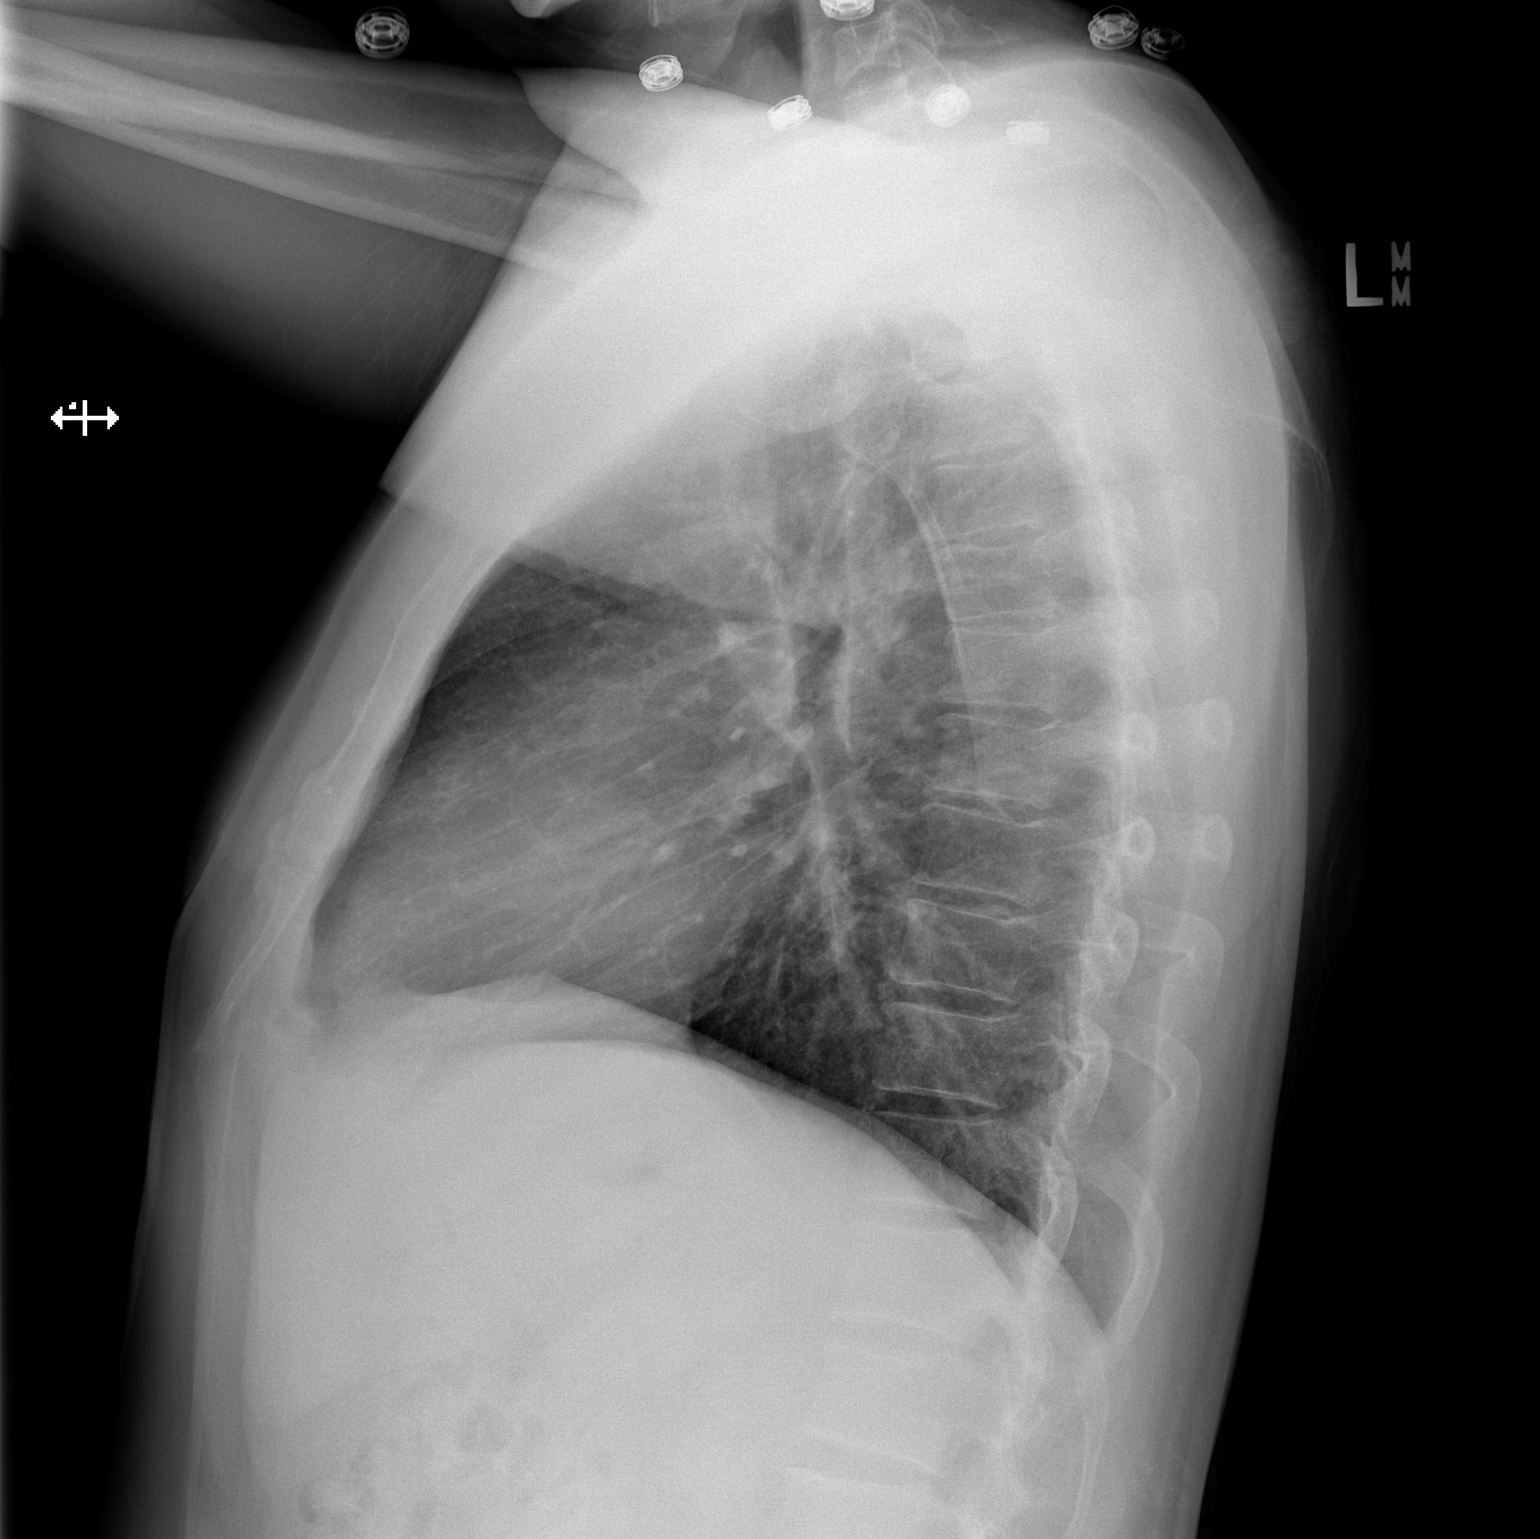

[2 of 2 positions shown; findings below may reference images not displayed]

FINDINGS: Normal heart size and pulmonary vascularity. No focal airspace
disease or consolidation in the lungs. No blunting of costophrenic
angles. No pneumothorax. Mediastinal contours appear intact. Focal
pleural thickening along the right apex corresponding to an old
fracture of the right anterior second rib on previous chest CT. No
evidence of infiltration, cavitation, or effusion to suggest TB.
IMPRESSION: No evidence of active pulmonary disease. No radiographic evidence to
suggest active TB.

## 2017-06-15 IMAGING — CT CT ABD-PELV W/ CM
2 of 4 series · 16 of 46 positions shown, 18 images · IV contrast (omnipaque)
Comparison: CT of the abdomen and pelvis performed 02/03/2014

CLINICAL DATA: Acute onset of right lower quadrant abdominal pain
and hematuria. Fever, nausea, chills and diaphoresis. Initial
encounter.

EXAM:
CT ABDOMEN AND PELVIS WITH CONTRAST
TECHNIQUE: Multidetector CT imaging of the abdomen and pelvis was performed
using the standard protocol following bolus administration of
intravenous contrast.
CONTRAST:  100mL OMNIPAQUE IOHEXOL 300 MG/ML  SOLN

[Series 2: abd/pel with · axial · 0.80mm/px · z∈[+1081,+1526]mm · 13 of 97 slices shown, 15 images]
[im 4/97  soft-tissue]
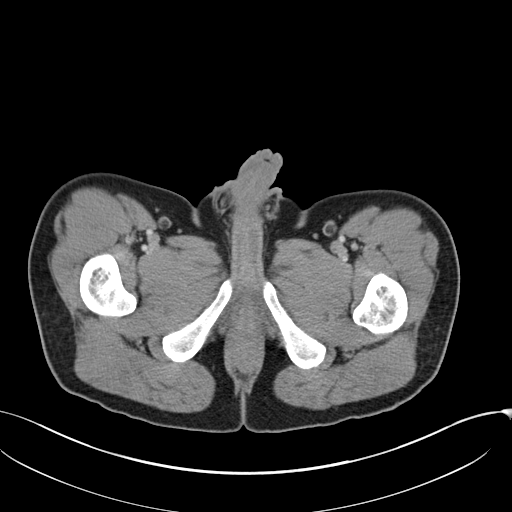
[im 4/97  bone]
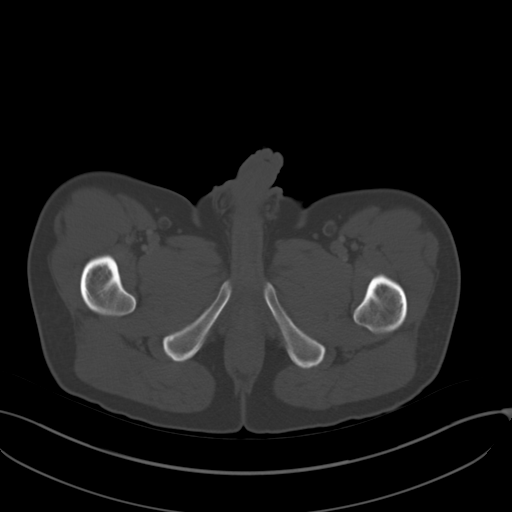
[im 12/97  soft-tissue]
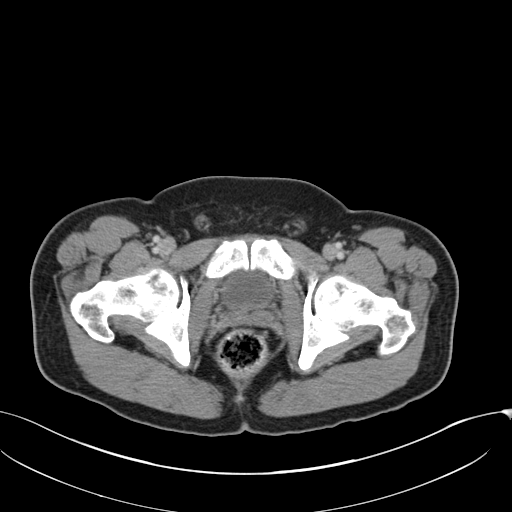
[im 20/97  soft-tissue]
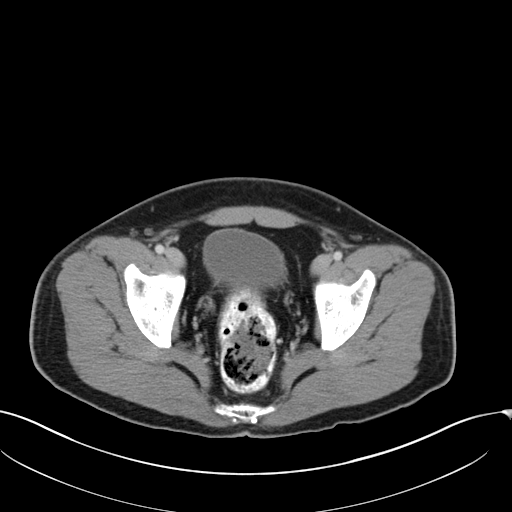
[im 27/97  soft-tissue]
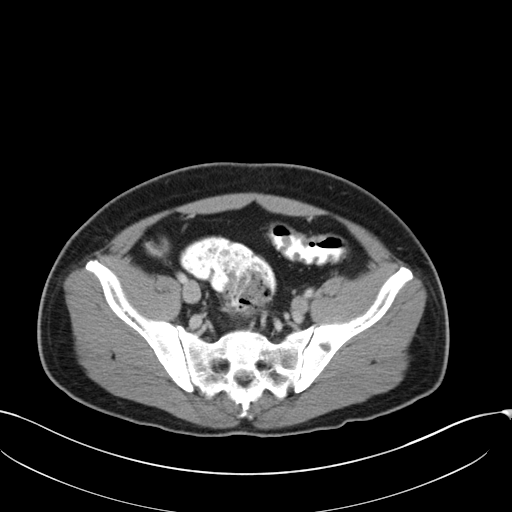
[im 35/97  soft-tissue]
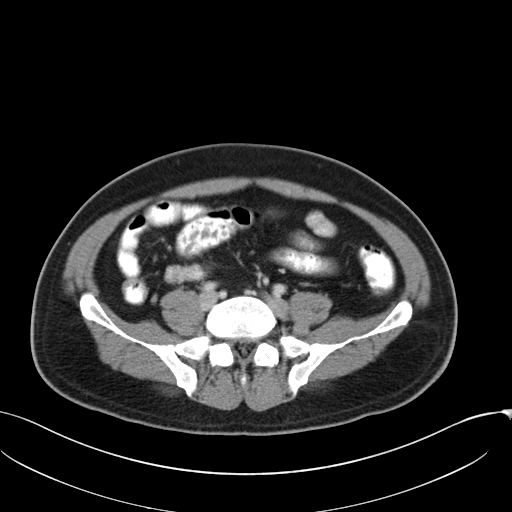
[im 43/97  soft-tissue]
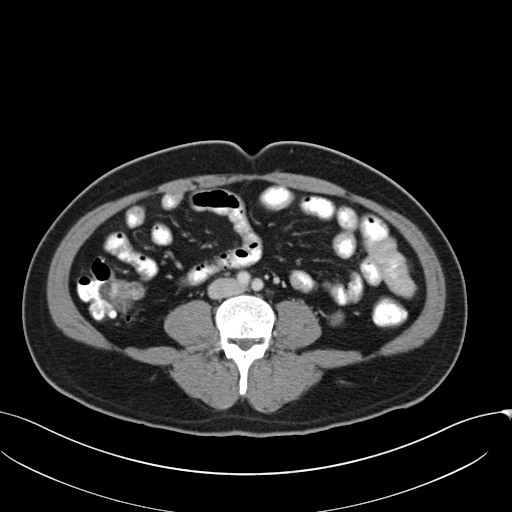
[im 50/97  soft-tissue]
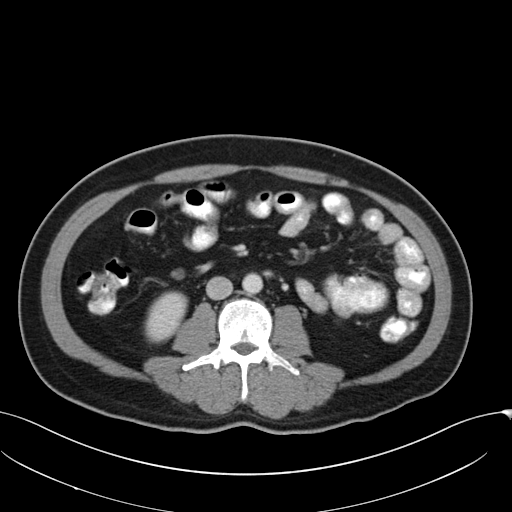
[im 54/97  soft-tissue]
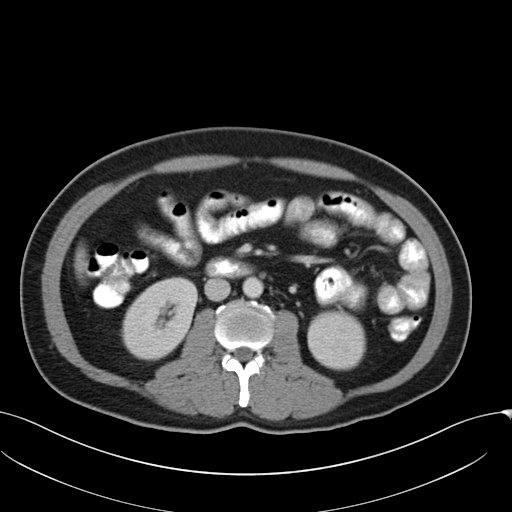
[im 62/97  soft-tissue]
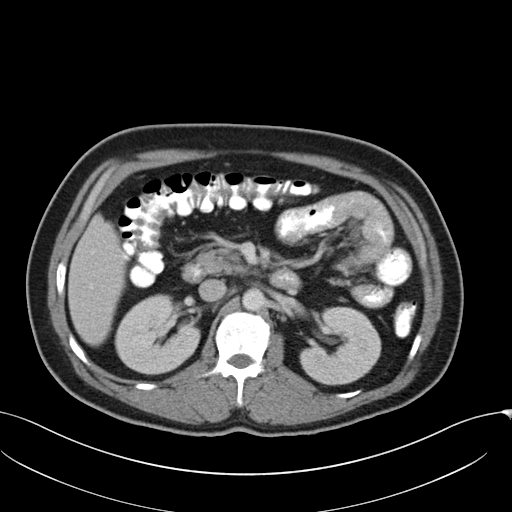
[im 62/97  bone]
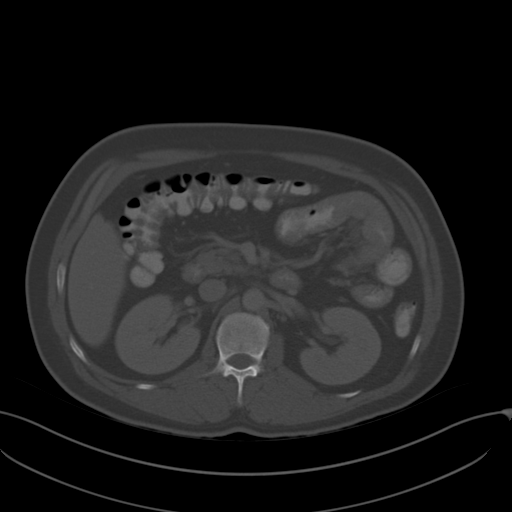
[im 70/97  soft-tissue]
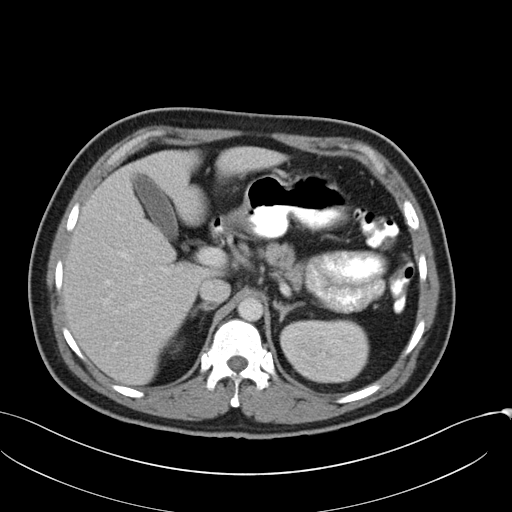
[im 77/97  soft-tissue]
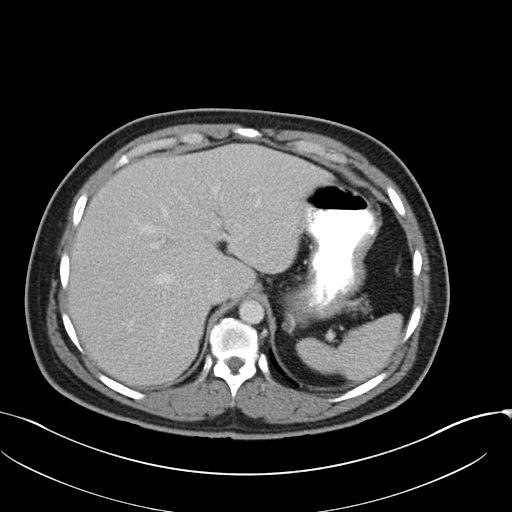
[im 85/97  soft-tissue]
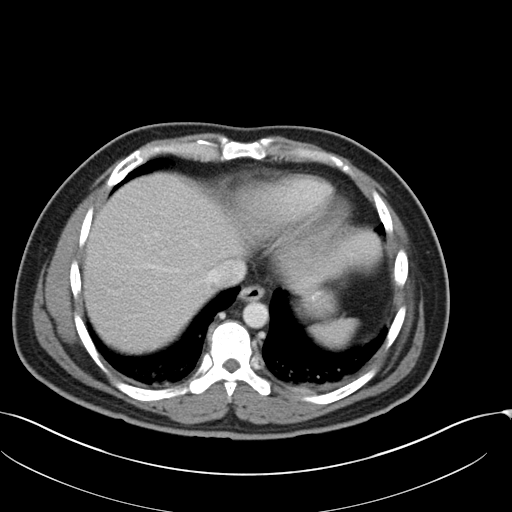
[im 93/97  soft-tissue]
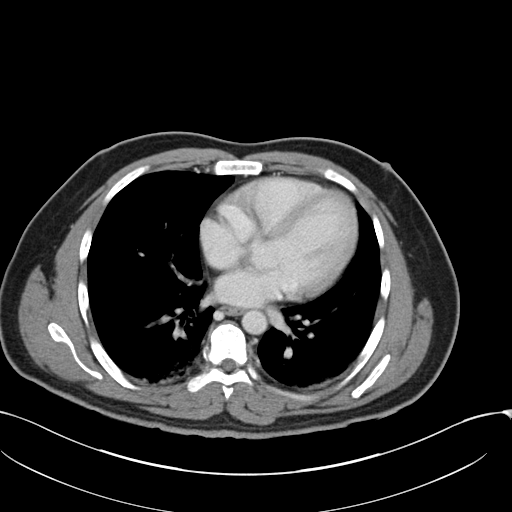

[Series 3: coronal a/|p · coronal · 0.71mm/px · 3 of 86 slices shown]
[im 29/86  soft-tissue]
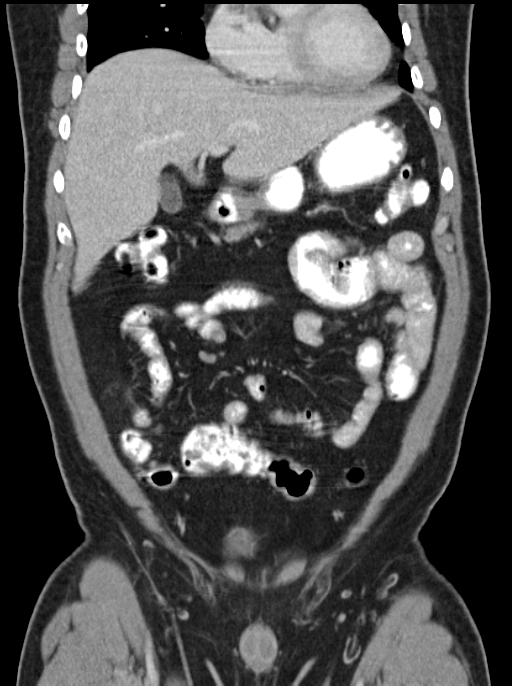
[im 38/86  soft-tissue]
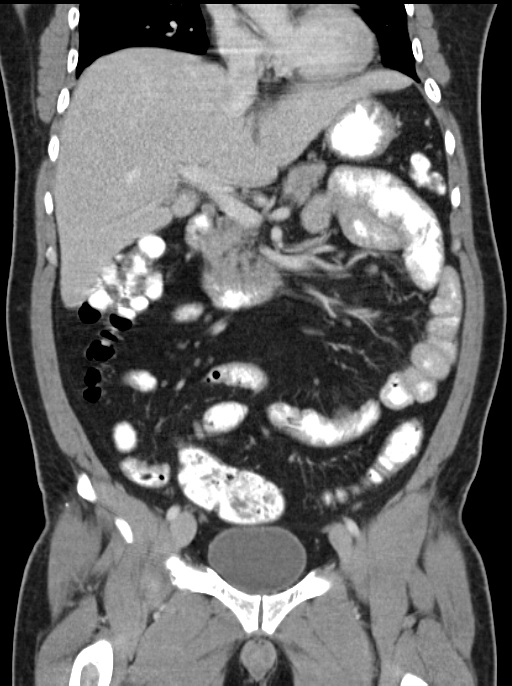
[im 48/86  soft-tissue]
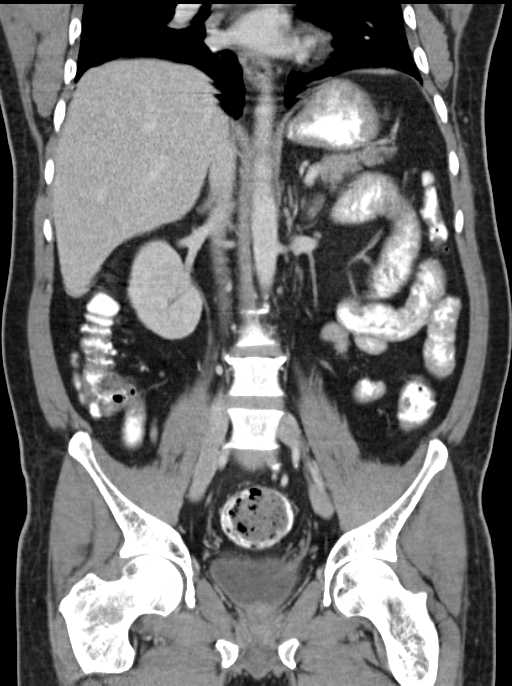

[16 of 46 positions shown; findings below may reference images not displayed]

FINDINGS: Mild bibasilar atelectasis is noted.

The liver and spleen are unremarkable in appearance. The gallbladder
is within normal limits. The pancreas and adrenal glands are
unremarkable.

A 1.2 cm cyst is noted at the interpole region of the left kidney.
The kidneys are otherwise unremarkable. There is no evidence of
hydronephrosis. No renal or ureteral stones are seen. No perinephric
stranding is appreciated.

No free fluid is identified. The small bowel is unremarkable in
appearance. The stomach is within normal limits. No acute vascular
abnormalities are seen.

The appendix is normal in caliber, without evidence of appendicitis.
Contrast progresses to the level of the rectum. The colon is
unremarkable in appearance.

The bladder is mildly distended and grossly unremarkable. The
prostate remains normal in size. No inguinal lymphadenopathy is
seen.

No acute osseous abnormalities are identified.
IMPRESSION: 1. No acute abnormality seen in the abdomen or pelvis.
2. Small left renal cyst noted.
3. Mild bibasilar atelectasis noted.
